# Patient Record
Sex: Male | Born: 1945 | Race: White | Hispanic: No | Smoking: Former smoker
Health system: Southern US, Community
[De-identification: ages and names within clinical notes are randomized; demographics above are authoritative.]

## PROBLEM LIST (undated history)

## (undated) DIAGNOSIS — C61 Malignant neoplasm of prostate: Secondary | ICD-10-CM

## (undated) DIAGNOSIS — S064X9A Epidural hemorrhage with loss of consciousness of unspecified duration, initial encounter: Secondary | ICD-10-CM

## (undated) DIAGNOSIS — R918 Other nonspecific abnormal finding of lung field: Secondary | ICD-10-CM

## (undated) DIAGNOSIS — D509 Iron deficiency anemia, unspecified: Secondary | ICD-10-CM

## (undated) DIAGNOSIS — M159 Polyosteoarthritis, unspecified: Secondary | ICD-10-CM

## (undated) DIAGNOSIS — Z8719 Personal history of other diseases of the digestive system: Secondary | ICD-10-CM

## (undated) DIAGNOSIS — G4700 Insomnia, unspecified: Secondary | ICD-10-CM

## (undated) DIAGNOSIS — I679 Cerebrovascular disease, unspecified: Secondary | ICD-10-CM

## (undated) DIAGNOSIS — R2681 Unsteadiness on feet: Secondary | ICD-10-CM

## (undated) DIAGNOSIS — J449 Chronic obstructive pulmonary disease, unspecified: Secondary | ICD-10-CM

## (undated) DIAGNOSIS — R569 Unspecified convulsions: Secondary | ICD-10-CM

## (undated) DIAGNOSIS — Z953 Presence of xenogenic heart valve: Secondary | ICD-10-CM

## (undated) DIAGNOSIS — I330 Acute and subacute infective endocarditis: Secondary | ICD-10-CM

## (undated) DIAGNOSIS — K219 Gastro-esophageal reflux disease without esophagitis: Secondary | ICD-10-CM

## (undated) DIAGNOSIS — Z952 Presence of prosthetic heart valve: Secondary | ICD-10-CM

## (undated) DIAGNOSIS — S32401A Unspecified fracture of right acetabulum, initial encounter for closed fracture: Secondary | ICD-10-CM

## (undated) DIAGNOSIS — I10 Essential (primary) hypertension: Secondary | ICD-10-CM

## (undated) DIAGNOSIS — Z87891 Personal history of nicotine dependence: Secondary | ICD-10-CM

## (undated) DIAGNOSIS — I428 Other cardiomyopathies: Secondary | ICD-10-CM

## (undated) DIAGNOSIS — E871 Hypo-osmolality and hyponatremia: Secondary | ICD-10-CM

## (undated) DIAGNOSIS — I4891 Unspecified atrial fibrillation: Secondary | ICD-10-CM

## (undated) DIAGNOSIS — T8189XA Other complications of procedures, not elsewhere classified, initial encounter: Secondary | ICD-10-CM

## (undated) DIAGNOSIS — I340 Nonrheumatic mitral (valve) insufficiency: Principal | ICD-10-CM

## (undated) HISTORY — DX: Other cardiomyopathies: I42.8

## (undated) HISTORY — PX: PROSTATE BIOPSY: SHX241

## (undated) HISTORY — DX: Malignant neoplasm of prostate: C61

## (undated) HISTORY — DX: Essential (primary) hypertension: I10

## (undated) HISTORY — DX: Insomnia, unspecified: G47.00

## (undated) HISTORY — DX: Iron deficiency anemia, unspecified: D50.9

## (undated) HISTORY — DX: Acute and subacute infective endocarditis: I33.0

## (undated) HISTORY — DX: Personal history of other diseases of the digestive system: Z87.19

## (undated) HISTORY — DX: Other nonspecific abnormal finding of lung field: R91.8

## (undated) HISTORY — DX: Chronic obstructive pulmonary disease, unspecified: J44.9

## (undated) HISTORY — DX: Unspecified atrial fibrillation: I48.91

## (undated) HISTORY — DX: Epidural hemorrhage with loss of consciousness of unspecified duration, initial encounter: S06.4X9A

## (undated) HISTORY — DX: Nonrheumatic mitral (valve) insufficiency: I34.0

## (undated) HISTORY — DX: Cerebrovascular disease, unspecified: I67.9

## (undated) HISTORY — DX: Gastro-esophageal reflux disease without esophagitis: K21.9

## (undated) HISTORY — DX: Polyosteoarthritis, unspecified: M15.9

## (undated) HISTORY — DX: Presence of prosthetic heart valve: Z95.2

## (undated) HISTORY — DX: Personal history of nicotine dependence: Z87.891

## (undated) HISTORY — DX: Unspecified fracture of right acetabulum, initial encounter for closed fracture: S32.401A

## (undated) HISTORY — DX: Hypo-osmolality and hyponatremia: E87.1

## (undated) HISTORY — DX: Unsteadiness on feet: R26.81

## (undated) HISTORY — PX: CARDIAC VALVE REPLACEMENT: SHX585

---

## 1997-07-30 DIAGNOSIS — J449 Chronic obstructive pulmonary disease, unspecified: Secondary | ICD-10-CM

## 1997-07-30 DIAGNOSIS — I330 Acute and subacute infective endocarditis: Secondary | ICD-10-CM

## 1997-07-30 HISTORY — DX: Chronic obstructive pulmonary disease, unspecified: J44.9

## 1997-07-30 HISTORY — DX: Acute and subacute infective endocarditis: I33.0

## 1998-01-03 ENCOUNTER — Inpatient Hospital Stay (HOSPITAL_COMMUNITY): Admission: EM | Admit: 1998-01-03 | Discharge: 1998-02-09 | Payer: Self-pay

## 1998-02-01 DIAGNOSIS — Z952 Presence of prosthetic heart valve: Secondary | ICD-10-CM | POA: Insufficient documentation

## 1998-02-01 HISTORY — PX: MITRAL VALVE REPLACEMENT: SHX147

## 1998-02-01 HISTORY — DX: Presence of prosthetic heart valve: Z95.2

## 1998-03-21 ENCOUNTER — Encounter: Admission: RE | Admit: 1998-03-21 | Discharge: 1998-06-19 | Payer: Self-pay | Admitting: Specialist

## 1999-01-16 ENCOUNTER — Encounter: Payer: Self-pay | Admitting: Emergency Medicine

## 1999-01-17 ENCOUNTER — Encounter: Payer: Self-pay | Admitting: Internal Medicine

## 1999-01-17 ENCOUNTER — Inpatient Hospital Stay (HOSPITAL_COMMUNITY): Admission: EM | Admit: 1999-01-17 | Discharge: 1999-01-19 | Payer: Self-pay | Admitting: Emergency Medicine

## 1999-01-17 ENCOUNTER — Encounter (HOSPITAL_BASED_OUTPATIENT_CLINIC_OR_DEPARTMENT_OTHER): Payer: Self-pay | Admitting: Internal Medicine

## 2003-09-22 ENCOUNTER — Ambulatory Visit (HOSPITAL_COMMUNITY): Admission: RE | Admit: 2003-09-22 | Discharge: 2003-09-22 | Payer: Self-pay | Admitting: Gastroenterology

## 2004-09-12 ENCOUNTER — Inpatient Hospital Stay (HOSPITAL_COMMUNITY): Admission: EM | Admit: 2004-09-12 | Discharge: 2004-09-23 | Payer: Self-pay | Admitting: Emergency Medicine

## 2004-09-12 ENCOUNTER — Ambulatory Visit: Payer: Self-pay | Admitting: Cardiology

## 2004-09-13 ENCOUNTER — Encounter: Payer: Self-pay | Admitting: Cardiology

## 2004-09-27 ENCOUNTER — Ambulatory Visit: Payer: Self-pay | Admitting: Cardiology

## 2004-10-05 ENCOUNTER — Ambulatory Visit: Payer: Self-pay | Admitting: Internal Medicine

## 2004-10-11 ENCOUNTER — Ambulatory Visit: Payer: Self-pay | Admitting: *Deleted

## 2004-10-18 ENCOUNTER — Ambulatory Visit: Payer: Self-pay | Admitting: Cardiovascular Disease

## 2004-10-26 ENCOUNTER — Ambulatory Visit: Payer: Self-pay | Admitting: Internal Medicine

## 2004-11-09 ENCOUNTER — Ambulatory Visit: Payer: Self-pay | Admitting: Cardiology

## 2004-12-07 ENCOUNTER — Ambulatory Visit: Payer: Self-pay | Admitting: Cardiology

## 2004-12-18 ENCOUNTER — Ambulatory Visit: Payer: Self-pay | Admitting: Cardiology

## 2004-12-18 ENCOUNTER — Ambulatory Visit: Payer: Self-pay

## 2005-01-04 ENCOUNTER — Ambulatory Visit: Payer: Self-pay | Admitting: Cardiology

## 2005-01-18 ENCOUNTER — Ambulatory Visit: Payer: Self-pay | Admitting: Cardiology

## 2005-02-08 ENCOUNTER — Ambulatory Visit: Payer: Self-pay | Admitting: Internal Medicine

## 2005-02-14 ENCOUNTER — Ambulatory Visit: Payer: Self-pay | Admitting: Cardiology

## 2005-02-16 ENCOUNTER — Ambulatory Visit: Admission: RE | Admit: 2005-02-16 | Discharge: 2005-02-16 | Payer: Self-pay | Admitting: Cardiology

## 2005-03-01 ENCOUNTER — Ambulatory Visit: Payer: Self-pay | Admitting: Internal Medicine

## 2005-03-01 ENCOUNTER — Ambulatory Visit: Payer: Self-pay | Admitting: Cardiology

## 2005-03-08 ENCOUNTER — Ambulatory Visit: Payer: Self-pay | Admitting: Cardiology

## 2005-03-09 ENCOUNTER — Ambulatory Visit: Payer: Self-pay

## 2005-03-29 ENCOUNTER — Ambulatory Visit: Payer: Self-pay | Admitting: Cardiology

## 2005-04-19 ENCOUNTER — Ambulatory Visit: Payer: Self-pay | Admitting: Cardiology

## 2005-05-02 ENCOUNTER — Ambulatory Visit: Payer: Self-pay | Admitting: Cardiology

## 2005-05-30 ENCOUNTER — Ambulatory Visit: Payer: Self-pay | Admitting: Cardiology

## 2005-06-11 ENCOUNTER — Ambulatory Visit: Payer: Self-pay | Admitting: Cardiology

## 2005-06-26 ENCOUNTER — Ambulatory Visit: Payer: Self-pay | Admitting: Cardiology

## 2005-07-17 ENCOUNTER — Ambulatory Visit: Payer: Self-pay | Admitting: Cardiology

## 2005-08-01 ENCOUNTER — Ambulatory Visit: Payer: Self-pay | Admitting: Cardiovascular Disease

## 2005-08-09 ENCOUNTER — Ambulatory Visit: Payer: Self-pay | Admitting: Cardiology

## 2005-08-15 ENCOUNTER — Ambulatory Visit: Payer: Self-pay | Admitting: Cardiovascular Disease

## 2005-08-24 ENCOUNTER — Ambulatory Visit: Payer: Self-pay | Admitting: Cardiology

## 2005-09-07 ENCOUNTER — Ambulatory Visit: Payer: Self-pay | Admitting: Cardiovascular Disease

## 2005-09-14 ENCOUNTER — Ambulatory Visit: Payer: Self-pay | Admitting: Cardiology

## 2005-09-28 ENCOUNTER — Ambulatory Visit: Payer: Self-pay | Admitting: Cardiology

## 2005-10-12 ENCOUNTER — Ambulatory Visit: Payer: Self-pay | Admitting: Internal Medicine

## 2005-11-01 ENCOUNTER — Ambulatory Visit: Payer: Self-pay | Admitting: Cardiology

## 2005-11-07 ENCOUNTER — Ambulatory Visit: Payer: Self-pay | Admitting: Cardiology

## 2005-11-14 ENCOUNTER — Ambulatory Visit: Payer: Self-pay | Admitting: Cardiology

## 2005-11-22 ENCOUNTER — Ambulatory Visit: Payer: Self-pay | Admitting: *Deleted

## 2005-12-06 ENCOUNTER — Ambulatory Visit: Payer: Self-pay | Admitting: Cardiology

## 2005-12-20 ENCOUNTER — Ambulatory Visit: Payer: Self-pay | Admitting: Internal Medicine

## 2006-01-10 ENCOUNTER — Ambulatory Visit: Payer: Self-pay | Admitting: Cardiology

## 2006-01-29 ENCOUNTER — Ambulatory Visit: Payer: Self-pay | Admitting: Pulmonary Disease

## 2006-02-06 ENCOUNTER — Ambulatory Visit: Payer: Self-pay | Admitting: Cardiology

## 2006-02-06 ENCOUNTER — Ambulatory Visit: Payer: Self-pay | Admitting: Cardiovascular Disease

## 2006-02-08 ENCOUNTER — Encounter: Payer: Self-pay | Admitting: Internal Medicine

## 2006-02-08 ENCOUNTER — Emergency Department (HOSPITAL_COMMUNITY): Admission: EM | Admit: 2006-02-08 | Discharge: 2006-02-08 | Payer: Self-pay | Admitting: Family Medicine

## 2006-02-08 ENCOUNTER — Ambulatory Visit: Payer: Self-pay

## 2006-02-12 ENCOUNTER — Ambulatory Visit: Payer: Self-pay | Admitting: Cardiology

## 2006-03-04 ENCOUNTER — Ambulatory Visit: Payer: Self-pay

## 2006-03-06 ENCOUNTER — Ambulatory Visit: Payer: Self-pay | Admitting: Cardiology

## 2006-03-20 ENCOUNTER — Ambulatory Visit: Payer: Self-pay | Admitting: Cardiology

## 2006-03-28 ENCOUNTER — Ambulatory Visit: Payer: Self-pay | Admitting: Cardiology

## 2006-04-11 ENCOUNTER — Ambulatory Visit: Payer: Self-pay | Admitting: Cardiology

## 2006-04-25 ENCOUNTER — Ambulatory Visit: Payer: Self-pay | Admitting: Cardiology

## 2006-05-23 ENCOUNTER — Ambulatory Visit: Payer: Self-pay | Admitting: Cardiology

## 2006-05-31 ENCOUNTER — Ambulatory Visit: Payer: Self-pay | Admitting: Cardiology

## 2006-06-14 ENCOUNTER — Ambulatory Visit: Payer: Self-pay | Admitting: Internal Medicine

## 2006-07-05 ENCOUNTER — Ambulatory Visit: Payer: Self-pay | Admitting: Cardiology

## 2006-07-15 ENCOUNTER — Ambulatory Visit: Payer: Self-pay | Admitting: Cardiology

## 2006-07-29 ENCOUNTER — Ambulatory Visit: Payer: Self-pay | Admitting: Cardiovascular Disease

## 2006-08-12 ENCOUNTER — Ambulatory Visit: Payer: Self-pay | Admitting: Cardiology

## 2006-08-22 ENCOUNTER — Ambulatory Visit: Payer: Self-pay | Admitting: Internal Medicine

## 2006-09-05 ENCOUNTER — Ambulatory Visit: Payer: Self-pay | Admitting: Cardiology

## 2006-09-23 ENCOUNTER — Ambulatory Visit: Payer: Self-pay | Admitting: Cardiology

## 2006-10-02 ENCOUNTER — Ambulatory Visit: Payer: Self-pay | Admitting: *Deleted

## 2006-10-11 ENCOUNTER — Ambulatory Visit: Payer: Self-pay | Admitting: Cardiovascular Disease

## 2006-11-01 ENCOUNTER — Ambulatory Visit: Payer: Self-pay | Admitting: Cardiology

## 2006-11-29 ENCOUNTER — Ambulatory Visit: Payer: Self-pay | Admitting: Cardiology

## 2006-12-09 ENCOUNTER — Ambulatory Visit: Payer: Self-pay | Admitting: Cardiology

## 2006-12-19 ENCOUNTER — Ambulatory Visit: Payer: Self-pay | Admitting: Internal Medicine

## 2007-01-06 ENCOUNTER — Ambulatory Visit: Payer: Self-pay | Admitting: Cardiovascular Disease

## 2007-02-03 ENCOUNTER — Ambulatory Visit: Payer: Self-pay | Admitting: Cardiology

## 2007-02-14 ENCOUNTER — Ambulatory Visit: Payer: Self-pay | Admitting: Cardiovascular Disease

## 2007-02-19 ENCOUNTER — Ambulatory Visit: Payer: Self-pay

## 2007-03-02 ENCOUNTER — Ambulatory Visit: Payer: Self-pay | Admitting: Pulmonary Disease

## 2007-03-02 ENCOUNTER — Inpatient Hospital Stay (HOSPITAL_COMMUNITY): Admission: EM | Admit: 2007-03-02 | Discharge: 2007-03-06 | Payer: Self-pay | Admitting: Emergency Medicine

## 2007-03-02 ENCOUNTER — Ambulatory Visit: Payer: Self-pay | Admitting: Cardiology

## 2007-03-06 ENCOUNTER — Encounter: Payer: Self-pay | Admitting: Pulmonary Disease

## 2007-03-07 ENCOUNTER — Ambulatory Visit: Payer: Self-pay | Admitting: Cardiology

## 2007-03-11 ENCOUNTER — Ambulatory Visit: Payer: Self-pay | Admitting: Cardiology

## 2007-03-19 ENCOUNTER — Ambulatory Visit: Payer: Self-pay | Admitting: Cardiology

## 2007-03-28 ENCOUNTER — Ambulatory Visit: Payer: Self-pay | Admitting: Pulmonary Disease

## 2007-04-10 ENCOUNTER — Ambulatory Visit: Payer: Self-pay | Admitting: Thoracic Surgery

## 2007-04-14 ENCOUNTER — Ambulatory Visit (HOSPITAL_COMMUNITY): Admission: RE | Admit: 2007-04-14 | Discharge: 2007-04-14 | Payer: Self-pay | Admitting: Gastroenterology

## 2007-04-14 ENCOUNTER — Encounter (INDEPENDENT_AMBULATORY_CARE_PROVIDER_SITE_OTHER): Payer: Self-pay | Admitting: Gastroenterology

## 2007-04-18 ENCOUNTER — Ambulatory Visit: Payer: Self-pay | Admitting: Cardiovascular Disease

## 2007-04-23 ENCOUNTER — Ambulatory Visit: Payer: Self-pay | Admitting: Thoracic Surgery (Cardiothoracic Vascular Surgery)

## 2007-04-24 ENCOUNTER — Ambulatory Visit: Payer: Self-pay | Admitting: Cardiology

## 2007-04-24 LAB — CONVERTED CEMR LAB
CO2: 29 meq/L (ref 19–32)
Creatinine, Ser: 1.2 mg/dL (ref 0.4–1.5)
Glucose, Bld: 93 mg/dL (ref 70–99)
Potassium: 4.9 meq/L (ref 3.5–5.1)
Sodium: 142 meq/L (ref 135–145)

## 2007-04-29 ENCOUNTER — Ambulatory Visit: Payer: Self-pay | Admitting: Cardiology

## 2007-04-29 ENCOUNTER — Inpatient Hospital Stay (HOSPITAL_COMMUNITY)
Admission: RE | Admit: 2007-04-29 | Discharge: 2007-05-03 | Payer: Self-pay | Admitting: Thoracic Surgery (Cardiothoracic Vascular Surgery)

## 2007-04-29 ENCOUNTER — Encounter: Payer: Self-pay | Admitting: Thoracic Surgery (Cardiothoracic Vascular Surgery)

## 2007-04-29 ENCOUNTER — Ambulatory Visit: Payer: Self-pay | Admitting: Thoracic Surgery (Cardiothoracic Vascular Surgery)

## 2007-04-29 HISTORY — PX: VIDEO ASSISTED THORACOSCOPY: SHX5073

## 2007-05-08 ENCOUNTER — Ambulatory Visit: Payer: Self-pay | Admitting: Cardiothoracic Surgery

## 2007-05-08 ENCOUNTER — Ambulatory Visit: Payer: Self-pay | Admitting: Cardiology

## 2007-05-16 ENCOUNTER — Ambulatory Visit: Payer: Self-pay | Admitting: Cardiothoracic Surgery

## 2007-05-16 ENCOUNTER — Encounter
Admission: RE | Admit: 2007-05-16 | Discharge: 2007-05-16 | Payer: Self-pay | Admitting: Thoracic Surgery (Cardiothoracic Vascular Surgery)

## 2007-05-16 ENCOUNTER — Ambulatory Visit: Payer: Self-pay | Admitting: Cardiology

## 2007-05-30 ENCOUNTER — Ambulatory Visit: Payer: Self-pay | Admitting: Cardiology

## 2007-05-30 LAB — CONVERTED CEMR LAB
Basophils Absolute: 0 10*3/uL (ref 0.0–0.1)
CO2: 29 meq/L (ref 19–32)
Calcium: 9 mg/dL (ref 8.4–10.5)
Creatinine, Ser: 1.2 mg/dL (ref 0.4–1.5)
Eosinophils Absolute: 0.4 10*3/uL (ref 0.0–0.6)
GFR calc Af Amer: 79 mL/min
Glucose, Bld: 105 mg/dL — ABNORMAL HIGH (ref 70–99)
HCT: 26.3 % — ABNORMAL LOW (ref 39.0–52.0)
MCHC: 33.2 g/dL (ref 30.0–36.0)
MCV: 80.3 fL (ref 78.0–100.0)
Monocytes Relative: 11.8 % — ABNORMAL HIGH (ref 3.0–11.0)
Neutrophils Relative %: 72.9 % (ref 43.0–77.0)
Platelets: 319 10*3/uL (ref 150–400)
Potassium: 5 meq/L (ref 3.5–5.1)
RBC: 3.28 M/uL — ABNORMAL LOW (ref 4.22–5.81)

## 2007-06-02 ENCOUNTER — Encounter
Admission: RE | Admit: 2007-06-02 | Discharge: 2007-06-02 | Payer: Self-pay | Admitting: Thoracic Surgery (Cardiothoracic Vascular Surgery)

## 2007-06-02 ENCOUNTER — Ambulatory Visit: Payer: Self-pay | Admitting: Thoracic Surgery (Cardiothoracic Vascular Surgery)

## 2007-06-05 ENCOUNTER — Ambulatory Visit: Payer: Self-pay | Admitting: Cardiology

## 2007-06-05 LAB — CONVERTED CEMR LAB
BUN: 23 mg/dL (ref 6–23)
Basophils Absolute: 0 10*3/uL (ref 0.0–0.1)
Creatinine, Ser: 1.3 mg/dL (ref 0.4–1.5)
Eosinophils Absolute: 0.4 10*3/uL (ref 0.0–0.6)
GFR calc Af Amer: 72 mL/min
GFR calc non Af Amer: 60 mL/min
HCT: 26 % — ABNORMAL LOW (ref 39.0–52.0)
Hemoglobin: 8.8 g/dL — ABNORMAL LOW (ref 13.0–17.0)
MCHC: 33.9 g/dL (ref 30.0–36.0)
MCV: 80.7 fL (ref 78.0–100.0)
Monocytes Absolute: 1.2 10*3/uL — ABNORMAL HIGH (ref 0.2–0.7)
Monocytes Relative: 12.2 % — ABNORMAL HIGH (ref 3.0–11.0)
Neutrophils Relative %: 71.2 % (ref 43.0–77.0)
Potassium: 4.6 meq/L (ref 3.5–5.1)
RDW: 16.2 % — ABNORMAL HIGH (ref 11.5–14.6)
Sodium: 139 meq/L (ref 135–145)

## 2007-06-10 ENCOUNTER — Ambulatory Visit: Payer: Self-pay | Admitting: Cardiology

## 2007-06-16 ENCOUNTER — Ambulatory Visit: Payer: Self-pay | Admitting: Pulmonary Disease

## 2007-06-20 ENCOUNTER — Ambulatory Visit: Payer: Self-pay | Admitting: Internal Medicine

## 2007-07-08 ENCOUNTER — Ambulatory Visit: Payer: Self-pay | Admitting: Cardiology

## 2007-07-08 LAB — CONVERTED CEMR LAB
Basophils Relative: 0 % (ref 0.0–1.0)
Eosinophils Absolute: 0.2 10*3/uL (ref 0.0–0.6)
Eosinophils Relative: 2.2 % (ref 0.0–5.0)
HCT: 31.1 % — ABNORMAL LOW (ref 39.0–52.0)
Neutrophils Relative %: 71.8 % (ref 43.0–77.0)
Platelets: 316 10*3/uL (ref 150–400)
RBC: 3.66 M/uL — ABNORMAL LOW (ref 4.22–5.81)
RDW: 17.1 % — ABNORMAL HIGH (ref 11.5–14.6)
WBC: 9.4 10*3/uL (ref 4.5–10.5)

## 2007-07-29 ENCOUNTER — Ambulatory Visit: Payer: Self-pay | Admitting: Cardiology

## 2007-07-29 LAB — CONVERTED CEMR LAB
Basophils Absolute: 0 10*3/uL (ref 0.0–0.1)
HCT: 33.6 % — ABNORMAL LOW (ref 39.0–52.0)
Hemoglobin: 10.9 g/dL — ABNORMAL LOW (ref 13.0–17.0)
Lymphocytes Relative: 12.3 % (ref 12.0–46.0)
MCHC: 32.3 g/dL (ref 30.0–36.0)
MCV: 85.6 fL (ref 78.0–100.0)
Monocytes Absolute: 1.2 10*3/uL — ABNORMAL HIGH (ref 0.2–0.7)
Monocytes Relative: 13.2 % — ABNORMAL HIGH (ref 3.0–11.0)
Neutro Abs: 6.6 10*3/uL (ref 1.4–7.7)
Neutrophils Relative %: 72.1 % (ref 43.0–77.0)
RDW: 16.1 % — ABNORMAL HIGH (ref 11.5–14.6)

## 2007-08-05 ENCOUNTER — Ambulatory Visit: Payer: Self-pay | Admitting: Cardiology

## 2007-08-29 ENCOUNTER — Ambulatory Visit: Payer: Self-pay | Admitting: Cardiology

## 2007-08-29 ENCOUNTER — Ambulatory Visit: Payer: Self-pay

## 2007-08-29 LAB — CONVERTED CEMR LAB
Basophils Relative: 0.3 % (ref 0.0–1.0)
Eosinophils Absolute: 0.2 10*3/uL (ref 0.0–0.6)
Lymphocytes Relative: 16 % (ref 12.0–46.0)
MCV: 83.9 fL (ref 78.0–100.0)
Monocytes Relative: 13.9 % — ABNORMAL HIGH (ref 3.0–11.0)
Neutro Abs: 6.1 10*3/uL (ref 1.4–7.7)
Platelets: 287 10*3/uL (ref 150–400)
RBC: 3.86 M/uL — ABNORMAL LOW (ref 4.22–5.81)

## 2007-09-10 ENCOUNTER — Ambulatory Visit: Payer: Self-pay | Admitting: Cardiology

## 2007-09-10 ENCOUNTER — Ambulatory Visit (HOSPITAL_COMMUNITY): Admission: RE | Admit: 2007-09-10 | Discharge: 2007-09-10 | Payer: Self-pay | Admitting: Cardiology

## 2007-09-10 LAB — CONVERTED CEMR LAB
BUN: 10 mg/dL (ref 6–23)
Calcium: 8.9 mg/dL (ref 8.4–10.5)
Chloride: 102 meq/L (ref 96–112)
GFR calc Af Amer: 79 mL/min
GFR calc non Af Amer: 65 mL/min
Saturation Ratios: 6.5 % — ABNORMAL LOW (ref 20.0–50.0)
Transferrin: 210.1 mg/dL — ABNORMAL LOW (ref >=212.0)

## 2007-09-17 ENCOUNTER — Ambulatory Visit (HOSPITAL_COMMUNITY): Admission: RE | Admit: 2007-09-17 | Discharge: 2007-09-17 | Payer: Self-pay | Admitting: Gastroenterology

## 2007-09-26 ENCOUNTER — Ambulatory Visit: Payer: Self-pay | Admitting: Internal Medicine

## 2007-10-10 ENCOUNTER — Ambulatory Visit: Payer: Self-pay | Admitting: Internal Medicine

## 2007-10-17 ENCOUNTER — Ambulatory Visit: Payer: Self-pay | Admitting: Cardiovascular Disease

## 2007-10-31 ENCOUNTER — Ambulatory Visit: Payer: Self-pay | Admitting: Cardiology

## 2007-11-14 ENCOUNTER — Ambulatory Visit: Payer: Self-pay | Admitting: Cardiology

## 2007-11-28 ENCOUNTER — Ambulatory Visit: Payer: Self-pay | Admitting: Cardiology

## 2007-12-19 ENCOUNTER — Ambulatory Visit: Payer: Self-pay | Admitting: Internal Medicine

## 2008-01-16 ENCOUNTER — Ambulatory Visit: Payer: Self-pay | Admitting: Cardiology

## 2008-01-28 ENCOUNTER — Ambulatory Visit: Payer: Self-pay | Admitting: Internal Medicine

## 2008-02-24 ENCOUNTER — Ambulatory Visit: Payer: Self-pay | Admitting: Cardiology

## 2008-02-24 ENCOUNTER — Ambulatory Visit: Payer: Self-pay | Admitting: Internal Medicine

## 2008-02-24 ENCOUNTER — Ambulatory Visit: Payer: Self-pay

## 2008-02-24 LAB — CONVERTED CEMR LAB
Basophils Absolute: 0 10*3/uL (ref 0.0–0.1)
Basophils Relative: 0.4 % (ref 0.0–3.0)
CO2: 28 meq/L (ref 19–32)
Calcium: 8.9 mg/dL (ref 8.4–10.5)
Creatinine, Ser: 1.3 mg/dL (ref 0.4–1.5)
Eosinophils Absolute: 0.1 10*3/uL (ref 0.0–0.7)
GFR calc non Af Amer: 59 mL/min
HCT: 37.3 % — ABNORMAL LOW (ref 39.0–52.0)
Hemoglobin: 12.9 g/dL — ABNORMAL LOW (ref 13.0–17.0)
MCHC: 34.6 g/dL (ref 30.0–36.0)
MCV: 91.3 fL (ref 78.0–100.0)
Monocytes Absolute: 1.1 10*3/uL — ABNORMAL HIGH (ref 0.1–1.0)
Neutro Abs: 5.7 10*3/uL (ref 1.4–7.7)
RBC: 4.08 M/uL — ABNORMAL LOW (ref 4.22–5.81)

## 2008-03-09 ENCOUNTER — Ambulatory Visit: Payer: Self-pay | Admitting: Internal Medicine

## 2008-03-16 ENCOUNTER — Ambulatory Visit: Payer: Self-pay | Admitting: Cardiology

## 2008-03-26 ENCOUNTER — Ambulatory Visit: Payer: Self-pay | Admitting: Cardiology

## 2008-04-07 ENCOUNTER — Ambulatory Visit: Payer: Self-pay | Admitting: Internal Medicine

## 2008-04-28 ENCOUNTER — Ambulatory Visit: Payer: Self-pay | Admitting: Cardiovascular Disease

## 2008-05-12 ENCOUNTER — Ambulatory Visit: Payer: Self-pay | Admitting: Internal Medicine

## 2008-06-02 ENCOUNTER — Ambulatory Visit: Payer: Self-pay | Admitting: Cardiology

## 2008-06-13 ENCOUNTER — Emergency Department (HOSPITAL_COMMUNITY): Admission: EM | Admit: 2008-06-13 | Discharge: 2008-06-13 | Payer: Self-pay | Admitting: Family Medicine

## 2008-06-16 ENCOUNTER — Ambulatory Visit: Payer: Self-pay | Admitting: Cardiology

## 2008-07-12 ENCOUNTER — Ambulatory Visit: Payer: Self-pay | Admitting: Cardiology

## 2008-07-14 ENCOUNTER — Ambulatory Visit: Payer: Self-pay | Admitting: Cardiovascular Disease

## 2008-08-11 ENCOUNTER — Ambulatory Visit: Payer: Self-pay | Admitting: Cardiology

## 2008-09-08 ENCOUNTER — Ambulatory Visit: Payer: Self-pay | Admitting: Cardiology

## 2008-09-29 ENCOUNTER — Ambulatory Visit: Payer: Self-pay | Admitting: Internal Medicine

## 2008-10-13 ENCOUNTER — Ambulatory Visit: Payer: Self-pay | Admitting: Cardiology

## 2008-10-20 ENCOUNTER — Ambulatory Visit: Payer: Self-pay | Admitting: Cardiology

## 2008-10-20 ENCOUNTER — Encounter: Payer: Self-pay | Admitting: Cardiology

## 2008-10-20 ENCOUNTER — Ambulatory Visit: Payer: Self-pay

## 2008-10-20 DIAGNOSIS — J439 Emphysema, unspecified: Secondary | ICD-10-CM | POA: Insufficient documentation

## 2008-10-20 DIAGNOSIS — I330 Acute and subacute infective endocarditis: Secondary | ICD-10-CM | POA: Insufficient documentation

## 2008-10-20 DIAGNOSIS — Z8719 Personal history of other diseases of the digestive system: Secondary | ICD-10-CM

## 2008-10-20 DIAGNOSIS — M159 Polyosteoarthritis, unspecified: Secondary | ICD-10-CM | POA: Insufficient documentation

## 2008-10-20 DIAGNOSIS — Z87891 Personal history of nicotine dependence: Secondary | ICD-10-CM | POA: Insufficient documentation

## 2008-10-20 DIAGNOSIS — I10 Essential (primary) hypertension: Secondary | ICD-10-CM

## 2008-10-20 DIAGNOSIS — K219 Gastro-esophageal reflux disease without esophagitis: Secondary | ICD-10-CM | POA: Insufficient documentation

## 2008-10-20 DIAGNOSIS — D509 Iron deficiency anemia, unspecified: Secondary | ICD-10-CM | POA: Insufficient documentation

## 2008-10-20 DIAGNOSIS — I679 Cerebrovascular disease, unspecified: Secondary | ICD-10-CM | POA: Insufficient documentation

## 2008-10-20 DIAGNOSIS — I428 Other cardiomyopathies: Secondary | ICD-10-CM

## 2008-10-20 DIAGNOSIS — J984 Other disorders of lung: Secondary | ICD-10-CM | POA: Insufficient documentation

## 2008-10-20 DIAGNOSIS — Z9889 Other specified postprocedural states: Secondary | ICD-10-CM | POA: Insufficient documentation

## 2008-10-20 HISTORY — DX: Other cardiomyopathies: I42.8

## 2008-10-20 HISTORY — DX: Essential (primary) hypertension: I10

## 2008-10-20 HISTORY — DX: Personal history of other diseases of the digestive system: Z87.19

## 2008-10-27 ENCOUNTER — Ambulatory Visit: Payer: Self-pay | Admitting: Cardiology

## 2008-11-03 ENCOUNTER — Encounter: Admission: RE | Admit: 2008-11-03 | Discharge: 2008-11-03 | Payer: Self-pay | Admitting: Internal Medicine

## 2008-11-04 ENCOUNTER — Ambulatory Visit: Payer: Self-pay | Admitting: Internal Medicine

## 2008-11-04 LAB — CONVERTED CEMR LAB: Prothrombin Time: 51.7 s — ABNORMAL HIGH (ref 10.9–13.3)

## 2008-11-09 ENCOUNTER — Ambulatory Visit (HOSPITAL_COMMUNITY): Admission: RE | Admit: 2008-11-09 | Discharge: 2008-11-09 | Payer: Self-pay | Admitting: Internal Medicine

## 2008-11-11 ENCOUNTER — Ambulatory Visit: Payer: Self-pay | Admitting: Internal Medicine

## 2008-11-11 ENCOUNTER — Encounter: Payer: Self-pay | Admitting: Cardiology

## 2008-11-11 ENCOUNTER — Ambulatory Visit: Payer: Self-pay | Admitting: Cardiology

## 2008-11-11 DIAGNOSIS — I4891 Unspecified atrial fibrillation: Secondary | ICD-10-CM | POA: Insufficient documentation

## 2008-11-11 DIAGNOSIS — I050 Rheumatic mitral stenosis: Secondary | ICD-10-CM | POA: Insufficient documentation

## 2008-11-11 DIAGNOSIS — I4892 Unspecified atrial flutter: Secondary | ICD-10-CM | POA: Insufficient documentation

## 2008-11-11 HISTORY — DX: Unspecified atrial fibrillation: I48.91

## 2008-11-18 ENCOUNTER — Ambulatory Visit: Payer: Self-pay | Admitting: Internal Medicine

## 2008-11-29 ENCOUNTER — Ambulatory Visit: Payer: Self-pay | Admitting: Cardiology

## 2008-11-29 ENCOUNTER — Ambulatory Visit: Payer: Self-pay | Admitting: Internal Medicine

## 2008-12-06 ENCOUNTER — Ambulatory Visit: Payer: Self-pay | Admitting: Cardiovascular Disease

## 2008-12-13 ENCOUNTER — Ambulatory Visit: Payer: Self-pay | Admitting: Internal Medicine

## 2008-12-20 ENCOUNTER — Ambulatory Visit: Payer: Self-pay | Admitting: Cardiology

## 2008-12-28 ENCOUNTER — Ambulatory Visit: Payer: Self-pay | Admitting: Cardiology

## 2008-12-28 ENCOUNTER — Encounter: Payer: Self-pay | Admitting: *Deleted

## 2008-12-28 ENCOUNTER — Ambulatory Visit: Payer: Self-pay | Admitting: Cardiovascular Disease

## 2008-12-28 LAB — CONVERTED CEMR LAB: Protime: 23.4

## 2009-01-04 ENCOUNTER — Ambulatory Visit: Payer: Self-pay | Admitting: Cardiology

## 2009-01-04 LAB — CONVERTED CEMR LAB: POC INR: 2

## 2009-01-05 LAB — CONVERTED CEMR LAB
Basophils Relative: 0.1 % (ref 0.0–3.0)
CO2: 31 meq/L (ref 19–32)
Calcium: 8.9 mg/dL (ref 8.4–10.5)
Hemoglobin: 13.9 g/dL (ref 13.0–17.0)
Lymphocytes Relative: 17.1 % (ref 12.0–46.0)
MCHC: 35.1 g/dL (ref 30.0–36.0)
Monocytes Relative: 15 % — ABNORMAL HIGH (ref 3.0–12.0)
Neutro Abs: 4.2 10*3/uL (ref 1.4–7.7)
RBC: 4.24 M/uL (ref 4.22–5.81)
Sodium: 141 meq/L (ref 135–145)

## 2009-01-11 ENCOUNTER — Ambulatory Visit: Payer: Self-pay | Admitting: Internal Medicine

## 2009-01-11 LAB — CONVERTED CEMR LAB
POC INR: 2.6
Protime: 19.5

## 2009-01-17 ENCOUNTER — Ambulatory Visit: Payer: Self-pay | Admitting: Cardiology

## 2009-01-17 ENCOUNTER — Encounter (INDEPENDENT_AMBULATORY_CARE_PROVIDER_SITE_OTHER): Payer: Self-pay

## 2009-01-17 ENCOUNTER — Ambulatory Visit: Payer: Self-pay | Admitting: Internal Medicine

## 2009-01-17 ENCOUNTER — Encounter (INDEPENDENT_AMBULATORY_CARE_PROVIDER_SITE_OTHER): Payer: Self-pay | Admitting: Pharmacist

## 2009-01-17 LAB — CONVERTED CEMR LAB
POC INR: 2.7
Protime: 19.9

## 2009-01-18 ENCOUNTER — Ambulatory Visit (HOSPITAL_COMMUNITY): Admission: RE | Admit: 2009-01-18 | Discharge: 2009-01-18 | Payer: Self-pay | Admitting: Cardiology

## 2009-01-18 ENCOUNTER — Ambulatory Visit: Payer: Self-pay | Admitting: Cardiology

## 2009-01-24 ENCOUNTER — Ambulatory Visit: Payer: Self-pay | Admitting: Cardiology

## 2009-01-24 LAB — CONVERTED CEMR LAB: POC INR: 1.8

## 2009-02-02 ENCOUNTER — Encounter: Payer: Self-pay | Admitting: *Deleted

## 2009-02-07 ENCOUNTER — Ambulatory Visit: Payer: Self-pay | Admitting: Cardiovascular Disease

## 2009-02-07 ENCOUNTER — Encounter (INDEPENDENT_AMBULATORY_CARE_PROVIDER_SITE_OTHER): Payer: Self-pay | Admitting: Cardiology

## 2009-02-07 LAB — CONVERTED CEMR LAB
POC INR: 3
Prothrombin Time: 21 s

## 2009-02-16 ENCOUNTER — Ambulatory Visit: Payer: Self-pay | Admitting: Cardiology

## 2009-03-07 ENCOUNTER — Ambulatory Visit: Payer: Self-pay | Admitting: Internal Medicine

## 2009-03-07 LAB — CONVERTED CEMR LAB: Prothrombin Time: 22.7 s

## 2009-03-09 ENCOUNTER — Emergency Department (HOSPITAL_COMMUNITY): Admission: EM | Admit: 2009-03-09 | Discharge: 2009-03-09 | Payer: Self-pay | Admitting: Family Medicine

## 2009-04-01 ENCOUNTER — Ambulatory Visit: Payer: Self-pay | Admitting: Internal Medicine

## 2009-04-01 LAB — CONVERTED CEMR LAB: POC INR: 5.7

## 2009-04-08 ENCOUNTER — Ambulatory Visit: Payer: Self-pay | Admitting: Internal Medicine

## 2009-04-19 ENCOUNTER — Encounter (INDEPENDENT_AMBULATORY_CARE_PROVIDER_SITE_OTHER): Payer: Self-pay | Admitting: *Deleted

## 2009-04-26 ENCOUNTER — Ambulatory Visit: Payer: Self-pay | Admitting: Cardiology

## 2009-05-03 ENCOUNTER — Ambulatory Visit: Payer: Self-pay | Admitting: Cardiology

## 2009-05-10 ENCOUNTER — Ambulatory Visit: Payer: Self-pay | Admitting: Cardiology

## 2009-05-10 LAB — CONVERTED CEMR LAB: POC INR: 4.1

## 2009-05-16 ENCOUNTER — Ambulatory Visit (HOSPITAL_COMMUNITY): Admission: RE | Admit: 2009-05-16 | Discharge: 2009-05-16 | Payer: Self-pay | Admitting: Internal Medicine

## 2009-05-26 ENCOUNTER — Ambulatory Visit: Payer: Self-pay

## 2009-05-26 ENCOUNTER — Ambulatory Visit: Payer: Self-pay | Admitting: Cardiology

## 2009-05-26 DIAGNOSIS — I6529 Occlusion and stenosis of unspecified carotid artery: Secondary | ICD-10-CM | POA: Insufficient documentation

## 2009-06-09 ENCOUNTER — Ambulatory Visit: Payer: Self-pay | Admitting: Internal Medicine

## 2009-06-09 ENCOUNTER — Ambulatory Visit: Payer: Self-pay | Admitting: Cardiology

## 2009-06-09 LAB — CONVERTED CEMR LAB: POC INR: 3.3

## 2009-06-13 LAB — CONVERTED CEMR LAB
Basophils Relative: 0.1 % (ref 0.0–3.0)
Eosinophils Relative: 1.8 % (ref 0.0–5.0)
GFR calc non Af Amer: 64.92 mL/min (ref >60)
Glucose, Bld: 103 mg/dL — ABNORMAL HIGH (ref 70–99)
HCT: 37.6 % — ABNORMAL LOW (ref 39.0–52.0)
Hemoglobin: 13 g/dL (ref 13.0–17.0)
Lymphocytes Relative: 14.7 % (ref 12.0–46.0)
Lymphs Abs: 1.1 10*3/uL (ref 0.7–4.0)
Monocytes Relative: 13.4 % — ABNORMAL HIGH (ref 3.0–12.0)
Neutro Abs: 5.6 10*3/uL (ref 1.4–7.7)
Potassium: 4.3 meq/L (ref 3.5–5.1)
RBC: 3.97 M/uL — ABNORMAL LOW (ref 4.22–5.81)
RDW: 12.9 % (ref 11.5–14.6)
Sodium: 140 meq/L (ref 135–145)

## 2009-06-30 ENCOUNTER — Ambulatory Visit: Payer: Self-pay | Admitting: Cardiology

## 2009-06-30 ENCOUNTER — Encounter (INDEPENDENT_AMBULATORY_CARE_PROVIDER_SITE_OTHER): Payer: Self-pay | Admitting: Cardiology

## 2009-08-01 ENCOUNTER — Ambulatory Visit: Payer: Self-pay | Admitting: Cardiology

## 2009-08-22 ENCOUNTER — Ambulatory Visit: Payer: Self-pay | Admitting: Cardiology

## 2009-08-22 LAB — CONVERTED CEMR LAB: POC INR: 2.8

## 2009-09-12 ENCOUNTER — Ambulatory Visit: Payer: Self-pay | Admitting: Cardiology

## 2009-09-12 LAB — CONVERTED CEMR LAB: POC INR: 2.7

## 2009-10-10 ENCOUNTER — Ambulatory Visit: Payer: Self-pay | Admitting: Cardiology

## 2009-10-10 LAB — CONVERTED CEMR LAB: POC INR: 2.7

## 2009-11-02 ENCOUNTER — Ambulatory Visit: Payer: Self-pay | Admitting: Cardiology

## 2009-11-02 LAB — CONVERTED CEMR LAB: POC INR: 2.4

## 2009-11-30 ENCOUNTER — Ambulatory Visit: Payer: Self-pay | Admitting: Cardiovascular Disease

## 2009-11-30 LAB — CONVERTED CEMR LAB: POC INR: 2.3

## 2009-12-14 ENCOUNTER — Ambulatory Visit: Payer: Self-pay | Admitting: Cardiology

## 2009-12-14 LAB — CONVERTED CEMR LAB: POC INR: 2.7

## 2010-01-11 ENCOUNTER — Ambulatory Visit: Payer: Self-pay | Admitting: Cardiology

## 2010-01-18 ENCOUNTER — Encounter: Payer: Self-pay | Admitting: Cardiology

## 2010-02-08 ENCOUNTER — Ambulatory Visit: Payer: Self-pay | Admitting: Cardiology

## 2010-02-22 ENCOUNTER — Ambulatory Visit: Payer: Self-pay | Admitting: Cardiology

## 2010-03-22 ENCOUNTER — Ambulatory Visit: Payer: Self-pay | Admitting: Cardiology

## 2010-04-11 ENCOUNTER — Encounter: Admission: RE | Admit: 2010-04-11 | Discharge: 2010-04-11 | Payer: Self-pay | Admitting: Internal Medicine

## 2010-04-19 ENCOUNTER — Ambulatory Visit: Payer: Self-pay | Admitting: Cardiology

## 2010-05-12 ENCOUNTER — Encounter: Payer: Self-pay | Admitting: Cardiology

## 2010-05-15 ENCOUNTER — Encounter: Payer: Self-pay | Admitting: Cardiology

## 2010-05-15 ENCOUNTER — Ambulatory Visit: Payer: Self-pay | Admitting: Cardiology

## 2010-05-15 ENCOUNTER — Ambulatory Visit: Payer: Self-pay

## 2010-05-15 LAB — CONVERTED CEMR LAB: POC INR: 4

## 2010-05-16 ENCOUNTER — Telehealth (INDEPENDENT_AMBULATORY_CARE_PROVIDER_SITE_OTHER): Payer: Self-pay | Admitting: *Deleted

## 2010-06-08 ENCOUNTER — Telehealth (INDEPENDENT_AMBULATORY_CARE_PROVIDER_SITE_OTHER): Payer: Self-pay | Admitting: *Deleted

## 2010-06-13 ENCOUNTER — Encounter: Admission: RE | Admit: 2010-06-13 | Discharge: 2010-06-13 | Payer: Self-pay | Admitting: Cardiology

## 2010-06-13 ENCOUNTER — Ambulatory Visit: Payer: Self-pay | Admitting: Cardiovascular Disease

## 2010-06-13 LAB — CONVERTED CEMR LAB: POC INR: 3.4

## 2010-07-11 ENCOUNTER — Ambulatory Visit: Payer: Self-pay | Admitting: Internal Medicine

## 2010-07-11 LAB — CONVERTED CEMR LAB: POC INR: 4.5

## 2010-07-27 ENCOUNTER — Ambulatory Visit: Payer: Self-pay | Admitting: Cardiology

## 2010-07-27 LAB — CONVERTED CEMR LAB

## 2010-08-10 ENCOUNTER — Ambulatory Visit: Admission: RE | Admit: 2010-08-10 | Discharge: 2010-08-10 | Payer: Self-pay | Source: Home / Self Care

## 2010-08-20 ENCOUNTER — Encounter: Payer: Self-pay | Admitting: Cardiothoracic Surgery

## 2010-08-20 ENCOUNTER — Encounter: Payer: Self-pay | Admitting: Thoracic Surgery (Cardiothoracic Vascular Surgery)

## 2010-08-24 ENCOUNTER — Ambulatory Visit: Admission: RE | Admit: 2010-08-24 | Discharge: 2010-08-24 | Payer: Self-pay | Source: Home / Self Care

## 2010-08-27 LAB — CONVERTED CEMR LAB
Basophils Absolute: 0 10*3/uL (ref 0.0–0.1)
Basophils Relative: 0.1 % (ref 0.0–3.0)
Calcium: 8.9 mg/dL (ref 8.4–10.5)
GFR calc non Af Amer: 71.87 mL/min (ref >60)
Hemoglobin: 14.4 g/dL (ref 13.0–17.0)
Lymphocytes Relative: 14.9 % (ref 12.0–46.0)
Monocytes Relative: 13.5 % — ABNORMAL HIGH (ref 3.0–12.0)
Neutro Abs: 6.3 10*3/uL (ref 1.4–7.7)
RBC: 4.39 M/uL (ref 4.22–5.81)
Sodium: 136 meq/L (ref 135–145)

## 2010-08-31 NOTE — Progress Notes (Signed)
  06/08/10--1145am--spoke to pt about scheduling another c. doppler in 6 months, also advised pt he needed comparison CXR  to determine reason for SOB --pt states he's sick today and can't come in for CXR, but has appoint in [facility] which he also missed, but he will call to reschedule and i will give [facility] order for CXR and put reminder in computer for 6 month f/u c. doppler--nt

## 2010-08-31 NOTE — Medication Information (Signed)
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 no    Recent/future dental: no  Any missed doses?: no       Is patient compliant with meds? yes       Allergies: 1)  ! Crestor (Rosuvastatin Calcium)  Anticoagulation Management History:      The patient is taking warfarin and comes in today for a routine follow up visit.  Negative risk factors for bleeding include an age less than 29 years old.  The bleeding index is 'low risk'.  Positive CHADS2 values include History of HTN.  Negative CHADS2 values include Age > 24 years old.  The start date was 09/12/2004.  His last INR was 5.6 ratio.  Anticoagulation responsible provider: Patty Sermons, MD .  INR POC: 2.6.  Cuvette Lot#: [id number].  Exp: 07/2011.    Anticoagulation Management Assessment/Plan:      The patient's current anticoagulation dose is Warfarin sodium 5 mg tabs: Use as directed by Anticoagulation Clinic.  The target INR is 2.5-3.5.  The next INR is due 09/21/2010.  Anticoagulation instructions were given to patient.  Results were reviewed/authorized by Weston Brass, PharmD.  He was notified by Stephannie Peters, PharmD Candidate .         Prior Anticoagulation Instructions: INR 3.3 Continue 1.5 pills everyday except 1 pill on Mondays and Fridays. Recheck in 2 weeks.   Current Anticoagulation Instructions: INR 2.6  Coumadin 5mg  tablets - Continue 1.5 tablets every day except 1 tablet on Mondays and Fridays

## 2010-08-31 NOTE — Medication Information (Signed)
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 no    Recent/future dental: no  Any missed doses?: no       Is patient compliant with meds? yes       Current Medications (verified): 1)  Flovent Diskus 100 Mcg/blist Aepb (Fluticasone Propionate (Inhal)) .... 4 Times A Day 2)  Atrovent Hfa 17 Mcg/act Aers (Ipratropium Bromide Hfa) .... 4 Times A Day 3)  Furosemide 20 Mg Tabs (Furosemide) .... Take One Tablet By Mouth Daily. 4)  Zetia 10 Mg Tabs (Ezetimibe) .... Take One Tablet By Mouth Daily. 5)  Altace 5 Mg Tabs (Ramipril) .... Take 1 Tablet By Mouth Once A Day 6)  Klor-Con M20 20 Meq Cr-Tabs (Potassium Chloride Crys Cr) .... Take 1 Tablet By Mouth Once A Day 7)  Metoprolol Tartrate 25 Mg Tabs (Metoprolol Tartrate) .... Take One Tablet By Mouth Twice A Day 8)  Zolpidem Tartrate 5 Mg Tabs (Zolpidem Tartrate) .... As Needed 9)  Cyclobenzaprine Hcl 5 Mg Tabs (Cyclobenzaprine Hcl) .... As Needed 10)  Ferrous Sulfate 325 (65 Fe) Mg Tabs (Ferrous Sulfate) .... Take 1 Tablet By Mouth Once A Day 11)  Omeprazole 20 Mg Tbec (Omeprazole) .... Take 1 Tablet By Mouth Once A Day 12)  Warfarin Sodium 5 Mg Tabs (Warfarin Sodium) .... Use As Directed By Anticoagulation Clinic 13)  Hydrocodone-Acetaminophen 5-500 Mg Tabs (Hydrocodone-Acetaminophen) .... As Needed 14)  Vitamin D3 1000 Unit Caps (Cholecalciferol) .... Take 1 Capsule By Mouth Once A Day  Allergies (verified): 1)  ! Crestor (Rosuvastatin Calcium)  Anticoagulation Management  History:      The patient is taking warfarin and comes in today for a routine follow up visit.  Negative risk factors for bleeding include an age less than 105 years old.  The bleeding index is 'low risk'.  Positive CHADS2 values include History of HTN.  Negative CHADS2 values include Age > 90 years old.  The start date was 09/12/2004.  His last INR was 5.6 ratio.  Anticoagulation responsible provider: Riley Kill MD, Maisie Fus.  INR POC: 2.2.  Exp: 08/2010.    Anticoagulation Management Assessment/Plan:      The patient's current anticoagulation dose is Warfarin sodium 5 mg tabs: Use as directed by Anticoagulation Clinic.  The target INR is 2.5-3.5.  The next INR is due 08/22/2009.  Anticoagulation instructions were given to patient.  Results were reviewed/authorized by Cloyde Reams, RN, BSN.  He was notified by Cloyde Reams RN.         Prior Anticoagulation Instructions: INR 3.2 Continue 1.5 tabs daily except 1 tab on Mondays and Fridays. Recheck in 4 weeks.    Current Anticoagulation Instructions: INR 2.2  Take 1.5 tablets today, then resume same dosage 1.5 tablets daily except 1 tablet on Mondays and Fridays.  Recheck in 3 weeks.

## 2010-08-31 NOTE — Progress Notes (Signed)
  Walk in Patient Form Recieved "  Pt left letter for Stuckey/Lauren to review" sent to Blanchard Mane [place]  May 16, 2010 1:23 PM

## 2010-08-31 NOTE — Medication Information (Signed)
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 no    Recent/future dental: no  Any missed doses?: no       Is patient compliant with meds? yes       Allergies: 1)  ! Crestor (Rosuvastatin Calcium)  Anticoagulation Management History:      The patient is taking warfarin and comes in today for a routine follow up visit.  Negative risk factors for bleeding include an age less than 12 years old.  The bleeding index is 'low risk'.  Positive CHADS2 values include History of HTN.  Negative CHADS2 values include Age > 35 years old.  The start date was 09/12/2004.  His last INR was 5.6 ratio.  Anticoagulation responsible Kensley Lares: Daleen Squibb MD, Maisie Fus.  INR POC: 4.0.  Cuvette Lot#: [id number].  Exp: 05/2011.    Anticoagulation Management Assessment/Plan:      The patient's current anticoagulation dose is Warfarin sodium 5 mg tabs: Use as directed by Anticoagulation Clinic.  The target INR is 2.5-3.5.  The next INR is due 06/05/2010.  Anticoagulation instructions were given to patient.  Results were reviewed/authorized by Cloyde Reams, RN, BSN.  He was notified by Cloyde Reams RN.         Prior Anticoagulation Instructions: INR 3.2  No changes. Continue taking 1 and a half tablets everyday except friday. On friday take 1 tablet. Recheck in 4 weeks.  Current Anticoagulation Instructions: INR 4.0  Skip today's dosage of Coumadin, then resume same dosage 1.5 tablets daily except 1 tablet on Fridays.  Recheck in 3-4 weeks.

## 2010-08-31 NOTE — Medication Information (Signed)
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 no    Recent/future dental: no  Any missed doses?: no       Is patient compliant with meds? yes       Allergies: 1)  ! Crestor (Rosuvastatin Calcium)  Anticoagulation Management History:      The patient is taking warfarin and comes in today for a routine follow up visit.  Negative risk factors for bleeding include an age less than 67 years old.  The bleeding index is 'low risk'.  Positive CHADS2 values include History of HTN.  Negative CHADS2 values include Age > 91 years old.  The start date was 09/12/2004.  His last INR was 5.6 ratio.  Anticoagulation responsible Toney Lizaola: Jens Som MD, Arlys John.  INR POC: INR 5.0.  Cuvette Lot#: [id number].  Exp: 04/2011.    Anticoagulation Management Assessment/Plan:      The patient's current anticoagulation dose is Warfarin sodium 5 mg tabs: Use as directed by Anticoagulation Clinic.  The target INR is 2.5-3.5.  The next INR is due 08/10/2010.  Anticoagulation instructions were given to patient.  Results were reviewed/authorized by Cloyde Reams, RN, BSN.  He was notified by Cloyde Reams RN.         Prior Anticoagulation Instructions: INR 4.5 Skip today's dose then resume 1.5 pill everyday except 1 pill on Fridays. Recheck in 2 weeks.   Current Anticoagulation Instructions: INR 5.0  Skip 2 doses of Coumadin, then start taking 1.5 tablets daily except 1 tablet on Mondays and Fridays.  Recheck in 10 days-2 weeks.

## 2010-08-31 NOTE — Assessment & Plan Note (Signed)
Summary: f8m   Visit Type:  3 months follow up Primary Provider:  [name]  CC:  No complains.  History of Present Illness: No real chest pain.  Some shortness of breath, but that is no different in past.   Feels ok.  Remains on iron.  Records reviewed.  Had antral ulcer, and ?ischemic colitis.  Current Medications (verified): 1)  Flovent Diskus 100 Mcg/blist Aepb (Fluticasone Propionate (Inhal)) .... 4 Times A Day 2)  Atrovent Hfa 17 Mcg/act Aers (Ipratropium Bromide Hfa) .... 4 Times A Day 3)  Furosemide 20 Mg Tabs (Furosemide) .... Take One Tablet By Mouth Daily. 4)  Zetia 10 Mg Tabs (Ezetimibe) .... Take One Tablet By Mouth Daily. 5)  Altace 5 Mg Tabs (Ramipril) .... Take 1 Tablet By Mouth Once A Day 6)  Klor-Con M20 20 Meq Cr-Tabs (Potassium Chloride Crys Cr) .... Take 1 Tablet By Mouth Once A Day 7)  Metoprolol Tartrate 25 Mg Tabs (Metoprolol Tartrate) .... Take One Tablet By Mouth Twice A Day 8)  Zolpidem Tartrate 5 Mg Tabs (Zolpidem Tartrate) .... As Needed 9)  Cyclobenzaprine Hcl 5 Mg Tabs (Cyclobenzaprine Hcl) .... As Needed 10)  Ferrous Sulfate 325 (65 Fe) Mg Tabs (Ferrous Sulfate) .... Take 1 Tablet By Mouth Once A Day 11)  Omeprazole 20 Mg Tbec (Omeprazole) .... Take 1 Tablet By Mouth Once A Day 12)  Warfarin Sodium 5 Mg Tabs (Warfarin Sodium) .... Use As Directed By Anticoagulation Clinic 13)  Hydrocodone-Acetaminophen 5-500 Mg Tabs (Hydrocodone-Acetaminophen) .... As Needed 14)  Vitamin D3 1000 Unit Caps (Cholecalciferol) .... Take 1 Capsule By Mouth Once A Day  Allergies: 1)  ! Crestor (Rosuvastatin Calcium)  Vital Signs:  Patient profile:   65 year old male Height:      69 inches Weight:      187 pounds BMI:     27.71 Pulse rate:   64 / minute Pulse rhythm:   regular Resp:     18 per minute BP sitting:   128 / 82  (left arm) Cuff size:   large  Vitals Entered By: Vikki Ports (November 02, 2009 9:23 AM)  Physical Exam  General:  Well developed, well  nourished, in no acute distress. Head:  normocephalic and atraumatic Lungs:  Clear bilaterally to auscultation and percussion. Heart:  Normal S1 and S2.  regular.  NO  MURMUR.   Abdomen:  Bowel sounds positive; abdomen soft and non-tender without masses, organomegaly, or hernias noted. No hepatosplenomegaly. Pulses:  pulses normal in all 4 extremities Extremities:  No clubbing or cyanosis. Neurologic:  Alert and oriented x 3.   EKG  Procedure date:  11/02/2009  Findings:      Normal Sinus rhythm.  PR 202,  no acute changes, or atrial fibrillation.  Echocardiogram  Procedure date:  10/20/2008  Findings:       SUMMARY   -  Overall left ventricular systolic function was normal. Left         ventricular ejection fraction was estimated to be 55 %. There         were no left ventricular regional wall motion abnormalities.   -  There was trivial aortic valvular regurgitation.   -  There was mild aortic root dilatation.   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 There was mild mitral         valvular regurgitation. Mean transmitral gradient was 4 mmHg.         Mitral valve area by  pressure half-time was 2.86 cm^2.   -  The left atrium was mild to moderately dilated.  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  Remains on warfarin.  Etiology was thought to be left atrial flutter, ? prior surgery. His updated medication list for this problem includes:    Metoprolol Tartrate 25 Mg Tabs (Metoprolol tartrate) .Marland Kitchen... Take one tablet by mouth twice a day    Warfarin Sodium 5 Mg Tabs (Warfarin sodium) ..... 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  Had echo one year ago, and exam is benign.   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  Will defer to Dr. Renae Gloss.  Patient Instructions: 1)  Your physician recommends that you continue on your current medications as directed. Please refer to the Current Medication list given to you today. 2)  Your physician wants you to follow-up in:  6 MONTHS.  You will receive a reminder letter in the mail two months in advance. If you don't receive a letter, please call our office to schedule the follow-up appointment. 3)  Your physician has requested that you have a carotid duplex in 6 MONTHS.  This test is an ultrasound of the carotid arteries in your neck. It looks at blood flow through these arteries that supply the brain with blood. Allow one hour for this exam. There are no restrictions or special instructions.

## 2010-08-31 NOTE — Medication Information (Signed)
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 no    Recent/future dental: no  Any missed doses?: no       Is patient compliant with meds? yes       Allergies: 1)  ! Crestor (Rosuvastatin Calcium)  Anticoagulation Management History:      The patient is taking warfarin and comes in today for a routine follow up visit.  Negative risk factors for bleeding include an age less than 35 years old.  The bleeding index is 'low risk'.  Positive CHADS2 values include History of HTN.  Negative CHADS2 values include Age > 88 years old.  The start date was 09/12/2004.  His last INR was 5.6 ratio.  Anticoagulation responsible provider: Shirlee Latch MD, Dalton.  INR POC: 3.2.  Cuvette Lot#: [id number].  Exp: 04/2011.    Anticoagulation Management Assessment/Plan:      The patient's current anticoagulation dose is Warfarin sodium 5 mg tabs: Use as directed by Anticoagulation Clinic.  The target INR is 2.5-3.5.  The next INR is due 05/15/2010.  Anticoagulation instructions were given to patient.  Results were reviewed/authorized by Lyna Poser, PharmD.         Prior Anticoagulation Instructions: INR 3.0  Continue 1.5 tablets daily except 1 tablet on Fridays.  Return to clinic in 4 weeks.  Current Anticoagulation Instructions: INR 3.2  No changes. Continue taking 1 and a half tablets everyday except friday. On friday take 1 tablet. Recheck in 4 weeks.

## 2010-08-31 NOTE — Medication Information (Signed)
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 no    Recent/future dental: no  Any missed doses?: no       Is patient compliant with meds? yes       Allergies: 1)  ! Crestor (Rosuvastatin Calcium)  Anticoagulation Management History:      The patient is taking warfarin and comes in today for a routine follow up visit.  Negative risk factors for bleeding include an age less than 58 years old.  The bleeding index is 'low risk'.  Positive CHADS2 values include History of HTN.  Negative CHADS2 values include Age > 47 years old.  The start date was 09/12/2004.  His last INR was 5.6 ratio.  Anticoagulation responsible provider: Eden Emms MD, Theron Arista.  INR POC: 2.3.  Cuvette Lot#: [id number].  Exp: 12/2010.    Anticoagulation Management Assessment/Plan:      The patient's current anticoagulation dose is Warfarin sodium 5 mg tabs: Use as directed by Anticoagulation Clinic.  The target INR is 2.5-3.5.  The next INR is due 12/14/2009.  Anticoagulation instructions were given to patient.  Results were reviewed/authorized by Bethena Midget, RN, BSN.  He was notified by Bethena Midget, RN, BSN.         Prior Anticoagulation Instructions: INR 2.4  Take 2 tablets today, then resume same dosage 1.5 tablets daily except 1 tablet on Mondays and Fridays.  Recheck in 4 weeks.    Current Anticoagulation Instructions: INR 2.3 Today take 2 pills then change dose to 1.5 pills everyday except 1 pill on Mondays. Recheck in 2 weeks.

## 2010-08-31 NOTE — Medication Information (Signed)
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 no    Recent/future dental: no  Any missed doses?: no       Is patient compliant with meds? yes       Allergies: 1)  ! Crestor (Rosuvastatin Calcium)  Anticoagulation Management History:      The patient is taking warfarin and comes in today for a routine follow up visit.  Negative risk factors for bleeding include an age less than 51 years old.  The bleeding index is 'low risk'.  Positive CHADS2 values include History of HTN.  Negative CHADS2 values include Age > 2 years old.  The start date was 09/12/2004.  His last INR was 5.6 ratio.  Anticoagulation responsible Neyland Pettengill: Shirlee Latch MD, Dalton.  INR POC: 3.0.  Cuvette Lot#: [id number].  Exp: 04/2011.    Anticoagulation Management Assessment/Plan:      The patient's current anticoagulation dose is Warfarin sodium 5 mg tabs: Use as directed by Anticoagulation Clinic.  The target INR is 2.5-3.5.  The next INR is due 04/19/2010.  Anticoagulation instructions were given to patient.  Results were reviewed/authorized by Weston Brass, PharmD.  He was notified by Liana Gerold, PharmD Candidate.         Prior Anticoagulation Instructions: INR 3.2  Continue same dose of 1.5 tabs daily except for 1 tab on Friday.  Re-check in 4 weeks.  Current Anticoagulation Instructions: INR 3.0  Continue 1.5 tablets daily except 1 tablet on Fridays.  Return to clinic in 4 weeks.

## 2010-08-31 NOTE — Medication Information (Signed)
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 no    Recent/future dental: no  Any missed doses?: no       Is patient compliant with meds? yes       Allergies: 1)  ! Crestor (Rosuvastatin Calcium)  Anticoagulation Management History:      The patient is taking warfarin and comes in today for a routine follow up visit.  Negative risk factors for bleeding include an age less than 85 years old.  The bleeding index is 'low risk'.  Positive CHADS2 values include History of HTN.  Negative CHADS2 values include Age > 71 years old.  The start date was 09/12/2004.  His last INR was 5.6 ratio.  Anticoagulation responsible provider: Jens Som MD, Arlys John.  INR POC: 2.7.  Cuvette Lot#: [id number].  Exp: 02/2011.    Anticoagulation Management Assessment/Plan:      The patient's current anticoagulation dose is Warfarin sodium 5 mg tabs: Use as directed by Anticoagulation Clinic.  The target INR is 2.5-3.5.  The next INR is due 01/11/2010.  Anticoagulation instructions were given to patient.  Results were reviewed/authorized by Weston Brass, PharmD.  He was notified by Weston Brass PharmD.         Prior Anticoagulation Instructions: INR 2.3 Today take 2 pills then change dose to 1.5 pills everyday except 1 pill on Mondays. Recheck in 2 weeks.   Current Anticoagulation Instructions: INR 2.7  Continue same dose of 1 1/2 tablets every day except 1 tablet on Monday

## 2010-08-31 NOTE — Letter (Signed)
Summary: [facility]  [facility]   Imported By: Marylou Mccoy 06/20/2010 15:59:17  _____________________________________________________________________  External Attachment:    Type:   Image     Comment:   External Document

## 2010-08-31 NOTE — Medication Information (Signed)
Summary: rov/sp  Anticoagulant Therapy  Managed by: Bethena Midget, RN, BSN Referring MD: Shawnie Pons MD PCP: [name] Supervising MD: Myrtis Ser MD, Tinnie Gens Indication 1: Atrial Fibrillation (ICD-427.31) Indication 2: Mitral Valve Replacement (ICD-V43.3) Lab Used: [facility] Site: [facility] INR POC 2.6 INR RANGE 2.5 - 3.5  Dietary changes: no    Health status changes: no    Bleeding/hemorrhagic complications: no    Recent/future hospitalizations: no    Any changes in medication regimen? no    Recent/future dental: no  Any missed doses?: no       Is patient compliant with meds? yes      Comments: Pt states he takes 5mg s on Fridays not Mondays.  Allergies: 1)  ! Crestor (Rosuvastatin Calcium)  Anticoagulation Management History:      The patient is taking warfarin and comes in today for a routine follow up visit.  Negative risk factors for bleeding include an age less than 27 years old.  The bleeding index is 'low risk'.  Positive CHADS2 values include History of HTN.  Negative CHADS2 values include Age > 79 years old.  The start date was 09/12/2004.  His last INR was 5.6 ratio.  Anticoagulation responsible provider: Myrtis Ser MD, Tinnie Gens.  INR POC: 2.6.  Cuvette Lot#: [id number].  Exp: 02/2011.    Anticoagulation Management Assessment/Plan:      The patient's current anticoagulation dose is Warfarin sodium 5 mg tabs: Use as directed by Anticoagulation Clinic.  The target INR is 2.5-3.5.  The next INR is due 02/08/2010.  Anticoagulation instructions were given to patient.  Results were reviewed/authorized by Bethena Midget, RN, BSN.  He was notified by Bethena Midget, RN, BSN.         Prior Anticoagulation Instructions: INR 2.7  Continue same dose of 1 1/2 tablets every day except 1 tablet on Monday   Current Anticoagulation Instructions: INR 2.6 Continue 7.5mg s everyday except 5mg  on Fridays. Recheck in 4 weeks.

## 2010-08-31 NOTE — Medication Information (Signed)
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 no    Recent/future dental: no  Any missed doses?: no       Is patient compliant with meds? yes       Allergies (verified): 1)  ! Crestor (Rosuvastatin Calcium)  Anticoagulation Management History:      The patient is taking warfarin and comes in today for a routine follow up visit.  Negative risk factors for bleeding include an age less than 66 years old.  The bleeding index is 'low risk'.  Positive CHADS2 values include History of HTN.  Negative CHADS2 values include Age > 30 years old.  The start date was 09/12/2004.  His last INR was 5.6 ratio.  Anticoagulation responsible provider: Jens Som MD, Arlys John.  INR POC: 2.4.  Cuvette Lot#: [id number].  Exp: 11/2010.    Anticoagulation Management Assessment/Plan:      The patient's current anticoagulation dose is Warfarin sodium 5 mg tabs: Use as directed by Anticoagulation Clinic.  The target INR is 2.5-3.5.  The next INR is due 11/30/2009.  Anticoagulation instructions were given to patient.  Results were reviewed/authorized by Cloyde Reams, RN, BSN.  He was notified by Cloyde Reams RN.         Prior Anticoagulation Instructions: INR 2.7  Continue taking 1 tablet on Monday and Friday and 1.5 tablets all other days.  Return to clinic in 4 weeks.   Current Anticoagulation Instructions: INR 2.4  Take 2 tablets today, then resume same dosage 1.5 tablets daily except 1 tablet on Mondays and Fridays.  Recheck in 4 weeks.

## 2010-08-31 NOTE — Medication Information (Signed)
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 no    Recent/future dental: no  Any missed doses?: no       Is patient compliant with meds? yes       Allergies: 1)  ! Crestor (Rosuvastatin Calcium)  Anticoagulation Management History:      The patient is taking warfarin and comes in today for a routine follow up visit.  Negative risk factors for bleeding include an age less than 33 years old.  The bleeding index is 'low risk'.  Positive CHADS2 values include History of HTN.  Negative CHADS2 values include Age > 23 years old.  The start date was 09/12/2004.  His last INR was 5.6 ratio.  Anticoagulation responsible provider: Antoine Poche MD, Fayrene Fearing.  INR POC: 2.8.  Cuvette Lot#: [id number].  Exp: 10/2010.    Anticoagulation Management Assessment/Plan:      The patient's current anticoagulation dose is Warfarin sodium 5 mg tabs: Use as directed by Anticoagulation Clinic.  The target INR is 2.5-3.5.  The next INR is due 09/12/2009.  Anticoagulation instructions were given to patient.  Results were reviewed/authorized by Lew Dawes, PharmD Candidate.  He was notified by Lew Dawes, PharmD Candidate.         Prior Anticoagulation Instructions: INR 2.2  Take 1.5 tablets today, then resume same dosage 1.5 tablets daily except 1 tablet on Mondays and Fridays.  Recheck in 3 weeks.    Current Anticoagulation Instructions: INR 2.8  Continue same dose of 1.5 tablets daily except 1 tablet on Mondays and Fridays. Recheck in 3 weeks.

## 2010-08-31 NOTE — Medication Information (Signed)
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 no    Recent/future dental: no  Any missed doses?: no       Is patient compliant with meds? yes       Allergies: 1)  ! Crestor (Rosuvastatin Calcium)  Anticoagulation Management History:      The patient is taking warfarin and comes in today for a routine follow up visit.  Negative risk factors for bleeding include an age less than 73 years old.  The bleeding index is 'low risk'.  Positive CHADS2 values include History of HTN.  Negative CHADS2 values include Age > 67 years old.  The start date was 09/12/2004.  His last INR was 5.6 ratio.  Anticoagulation responsible provider: Shirlee Latch MD, Camdyn Beske.  INR POC: 2.7.  Cuvette Lot#: [id number].  Exp: 10/2010.    Anticoagulation Management Assessment/Plan:      The patient's current anticoagulation dose is Warfarin sodium 5 mg tabs: Use as directed by Anticoagulation Clinic.  The target INR is 2.5-3.5.  The next INR is due 10/10/2009.  Anticoagulation instructions were given to patient.  Results were reviewed/authorized by Eda Keys, PharmD.  He was notified by Eda Keys.         Prior Anticoagulation Instructions: INR 2.8  Continue same dose of 1.5 tablets daily except 1 tablet on Mondays and Fridays. Recheck in 3 weeks.  Current Anticoagulation Instructions: INR 2.7  Continue current dosing schedule.  Take 1 tablet on Monday and Friday and take 1.5 tablets all other days. Return to clinic in 4 weeks.

## 2010-08-31 NOTE — Assessment & Plan Note (Signed)
Summary: f30m   Visit Type:  Follow-up Primary Provider:  [name]  CC:  no xomplaints.  History of Present Illness: Overall doing well.  Been deer huntin.  Stood up in stand without difficulty.   He was a little short of breath when twenty feet off the ground.  No chest pain.  Current Medications (verified): 1)  Flovent Diskus 100 Mcg/blist Aepb (Fluticasone Propionate (Inhal)) .... 4 Times A Day 2)  Atrovent Hfa 17 Mcg/act Aers (Ipratropium Bromide Hfa) .... 4 Times A Day 3)  Furosemide 20 Mg Tabs (Furosemide) .... Take One Tablet By Mouth Daily. 4)  Zetia 10 Mg Tabs (Ezetimibe) .... Take One Tablet By Mouth Daily. 5)  Altace 5 Mg Tabs (Ramipril) .... Take 1 Tablet By Mouth Once A Day 6)  Klor-Con M20 20 Meq Cr-Tabs (Potassium Chloride Crys Cr) .... Take 1 Tablet By Mouth Once A Day 7)  Metoprolol Tartrate 25 Mg Tabs (Metoprolol Tartrate) .... Take One Tablet By Mouth Twice A Day 8)  Zolpidem Tartrate 5 Mg Tabs (Zolpidem Tartrate) .... As Needed 9)  Cyclobenzaprine Hcl 5 Mg Tabs (Cyclobenzaprine Hcl) .... As Needed 10)  Ferrous Sulfate 325 (65 Fe) Mg Tabs (Ferrous Sulfate) .... Take 1 Tablet By Mouth Once A Day 11)  Omeprazole 20 Mg Tbec (Omeprazole) .... Take 1 Tablet By Mouth Once A Day 12)  Warfarin Sodium 5 Mg Tabs (Warfarin Sodium) .... Use As Directed By Anticoagulation Clinic 13)  Hydrocodone-Acetaminophen 5-500 Mg Tabs (Hydrocodone-Acetaminophen) .... As Needed 14)  Vitamin D3 1000 Unit Caps (Cholecalciferol) .... Take 1 Capsule By Mouth Once A Day  Allergies: 1)  ! 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 Lungs:  Clear bilaterally to auscultation and percussion. Heart:  No definite murmur.  Sounds regular. Abdomen:  Bowel sounds positive; abdomen soft and non-tender without masses, organomegaly, or hernias noted. No hepatosplenomegaly. Extremities:  No clubbing or cyanosis.  No edema.   EKG  Procedure date:  08/01/2009  Findings:      NSR with PAC's.  Occasional PVC.    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  Continue current medications. His updated medication list for this problem includes:    Metoprolol Tartrate 25 Mg Tabs (Metoprolol tartrate) .Marland Kitchen... Take one tablet by mouth twice a day    Warfarin Sodium 5 Mg Tabs (Warfarin sodium) ..... 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  Patient Instructions: 1)  Your physician recommends that you schedule a follow-up appointment in: 3  MONTHS 2)  Your physician recommends that you continue on your current medications as directed. Please refer to the Current Medication list given to you today.

## 2010-08-31 NOTE — Medication Information (Signed)
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 no    Recent/future dental: no  Any missed doses?: no       Is patient compliant with meds? yes       Allergies: 1)  ! Crestor (Rosuvastatin Calcium)  Anticoagulation Management History:      The patient is taking warfarin and comes in today for a routine follow up visit.  Negative risk factors for bleeding include an age less than 1 years old.  The bleeding index is 'low risk'.  Positive CHADS2 values include History of HTN.  Negative CHADS2 values include Age > 52 years old.  The start date was 09/12/2004.  His last INR was 5.6 ratio.  Anticoagulation responsible provider: Shirlee Latch MD, Marcellus Pulliam.  INR POC: 5.0.  Cuvette Lot#: [id number].  Exp: 03/2011.    Anticoagulation Management Assessment/Plan:      The patient's current anticoagulation dose is Warfarin sodium 5 mg tabs: Use as directed by Anticoagulation Clinic.  The target INR is 2.5-3.5.  The next INR is due 02/22/2010.  Anticoagulation instructions were given to patient.  Results were reviewed/authorized by Weston Brass, PharmD.  He was notified by Weston Brass PharmD.         Prior Anticoagulation Instructions: INR 2.6 Continue 7.5mg s everyday except 5mg  on Fridays. Recheck in 4 weeks.   Current Anticoagulation Instructions: INR 5.0  Skip today's dose of Coumadin, take 1/2 tablet tomorrow then resume same dose of 1 1/2 tablets every day except 1 tablet on Friday.

## 2010-08-31 NOTE — Miscellaneous (Signed)
Summary: Orders Update  Clinical Lists Changes  Orders: Added new Test order of Carotid Duplex (Carotid Duplex) - Signed 

## 2010-08-31 NOTE — Medication Information (Signed)
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 yes       Details: started allopurinol 300mg  twice daily on 7/25  Recent/future dental: no  Any missed doses?: no       Is patient compliant with meds? yes       Current Medications (verified): 1)  Flovent Diskus 100 Mcg/blist Aepb (Fluticasone Propionate (Inhal)) .... 4 Times A Day 2)  Atrovent Hfa 17 Mcg/act Aers (Ipratropium Bromide Hfa) .... 4 Times A Day 3)  Furosemide 20 Mg Tabs (Furosemide) .... Take One Tablet By Mouth Daily. 4)  Zetia 10 Mg Tabs (Ezetimibe) .... Take One Tablet By Mouth Daily. 5)  Altace 5 Mg Tabs (Ramipril) .... Take 1 Tablet By Mouth Once A Day 6)  Klor-Con M20 20 Meq Cr-Tabs (Potassium Chloride Crys Cr) .... Take 1 Tablet By Mouth Once A Day 7)  Metoprolol Tartrate 25 Mg Tabs (Metoprolol Tartrate) .... Take One Tablet By Mouth Twice A Day 8)  Zolpidem Tartrate 5 Mg Tabs (Zolpidem Tartrate) .... As Needed 9)  Cyclobenzaprine Hcl 5 Mg Tabs (Cyclobenzaprine Hcl) .... As Needed 10)  Ferrous Sulfate 325 (65 Fe) Mg Tabs (Ferrous Sulfate) .... Take 1 Tablet By Mouth Once A Day 11)  Omeprazole 20 Mg Tbec (Omeprazole) .... Take 1 Tablet By Mouth Once A Day 12)  Warfarin Sodium 5 Mg Tabs (Warfarin Sodium) .... Use As Directed By Anticoagulation Clinic 13)  Hydrocodone-Acetaminophen 5-500 Mg Tabs (Hydrocodone-Acetaminophen) .... As Needed 14)  Vitamin D3 1000 Unit Caps (Cholecalciferol) .... Take 1 Capsule By Mouth Once A Day 15)  Allopurinol 300 Mg Tabs (Allopurinol)  .... Take 1 Tablet By Mouth Twice Daily  Allergies (verified): 1)  ! Crestor (Rosuvastatin Calcium)  Anticoagulation Management History:      The patient is taking warfarin and comes in today for a routine follow up visit.  Negative risk factors for bleeding include an age less than 44 years old.  The bleeding index is 'low risk'.  Positive CHADS2 values include History of HTN.  Negative CHADS2 values include Age > 41 years old.  The start date was 09/12/2004.  His last INR was 5.6 ratio.  Anticoagulation responsible provider: Shirlee Latch MD, Jessyka Austria.  INR POC: 3.2.  Cuvette Lot#: [id number].  Exp: 04/2011.    Anticoagulation Management Assessment/Plan:      The patient's current anticoagulation dose is Warfarin sodium 5 mg tabs: Use as directed by Anticoagulation Clinic.  The target INR is 2.5-3.5.  The next INR is due 03/22/2010.  Anticoagulation instructions were given to patient.  Results were reviewed/authorized by Weston Brass, PharmD.  He was notified by Dillard Cannon.         Prior Anticoagulation Instructions: INR 5.0  Skip today's dose of Coumadin, take 1/2 tablet tomorrow then resume same dose of 1 1/2 tablets every day except 1 tablet on Friday.   Current Anticoagulation Instructions: INR 3.2  Continue same dose of 1.5 tabs daily except for 1 tab on Friday.  Re-check in 4 weeks.

## 2010-08-31 NOTE — Medication Information (Signed)
Summary: rov/eac  Anticoagulant Therapy  Managed by: Eda Keys, PharmD Referring MD: Shawnie Pons MD PCP: [name] Supervising MD: Myrtis Ser MD, Tinnie Gens Indication 1: Atrial Fibrillation (ICD-427.31) Indication 2: Mitral Valve Replacement (ICD-V43.3) Lab Used: [facility] Site: [facility] INR POC 2.7 INR RANGE 2.5 - 3.5  Dietary changes: no    Health status changes: no    Bleeding/hemorrhagic complications: no    Recent/future hospitalizations: no    Any changes in medication regimen? no    Recent/future dental: no  Any missed doses?: no       Is patient compliant with meds? yes       Allergies: 1)  ! Crestor (Rosuvastatin Calcium)  Anticoagulation Management History:      The patient is taking warfarin and comes in today for a routine follow up visit.  Negative risk factors for bleeding include an age less than 62 years old.  The bleeding index is 'low risk'.  Positive CHADS2 values include History of HTN.  Negative CHADS2 values include Age > 66 years old.  The start date was 09/12/2004.  His last INR was 5.6 ratio.  Anticoagulation responsible provider: Myrtis Ser MD, Tinnie Gens.  INR POC: 2.7.  Cuvette Lot#: [id number].  Exp: 11/2010.    Anticoagulation Management Assessment/Plan:      The patient's current anticoagulation dose is Warfarin sodium 5 mg tabs: Use as directed by Anticoagulation Clinic.  The target INR is 2.5-3.5.  The next INR is due 11/07/2009.  Anticoagulation instructions were given to patient.  Results were reviewed/authorized by Eda Keys, PharmD.  He was notified by Eda Keys.         Prior Anticoagulation Instructions: INR 2.7  Continue current dosing schedule.  Take 1 tablet on Monday and Friday and take 1.5 tablets all other days. Return to clinic in 4 weeks.  Current Anticoagulation Instructions: INR 2.7  Continue taking 1 tablet on Monday and Friday and 1.5 tablets all other days.  Return to clinic in 4 weeks.

## 2010-08-31 NOTE — Medication Information (Signed)
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 no    Recent/future dental: no  Any missed doses?: no       Is patient compliant with meds? yes       Allergies: 1)  ! Crestor (Rosuvastatin Calcium)  Anticoagulation Management History:      The patient is taking warfarin and comes in today for a routine follow up visit.  Negative risk factors for bleeding include an age less than 58 years old.  The bleeding index is 'low risk'.  Positive CHADS2 values include History of HTN.  Negative CHADS2 values include Age > 48 years old.  The start date was 09/12/2004.  His last INR was 5.6 ratio.  Anticoagulation responsible provider: Excell Seltzer MD, Casimiro Needle.  INR POC: 3.4.  Cuvette Lot#: [id number].  Exp: 06/2011.    Anticoagulation Management Assessment/Plan:      The patient's current anticoagulation dose is Warfarin sodium 5 mg tabs: Use as directed by Anticoagulation Clinic.  The target INR is 2.5-3.5.  The next INR is due 07/11/2010.  Anticoagulation instructions were given to patient.  Results were reviewed/authorized by Weston Brass, PharmD.  He was notified by Hoy Register, PharmD Candidate.         Prior Anticoagulation Instructions: INR 4.0  Skip today's dosage of Coumadin, then resume same dosage 1.5 tablets daily except 1 tablet on Fridays.  Recheck in 3-4 weeks.    Current Anticoagulation Instructions: INR 3.4 Continue previous dose 1.5 tablets everyday except 1 tablet on Friday Recheck INR in 4 weeks

## 2010-08-31 NOTE — Medication Information (Signed)
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 no    Recent/future dental: no  Any missed doses?: no       Is patient compliant with meds? yes       Allergies: 1)  ! Crestor (Rosuvastatin Calcium)  Anticoagulation Management History:      The patient is taking warfarin and comes in today for a routine follow up visit.  Negative risk factors for bleeding include an age less than 65 years old.  The bleeding index is 'low risk'.  Positive CHADS2 values include History of HTN.  Negative CHADS2 values include Age > 65 years old.  The start date was 09/12/2004.  His last INR was 5.6 ratio.  Anticoagulation responsible provider: Ladona Ridgel MD, Sharlot Gowda.  INR POC: 3.3.  Cuvette Lot#: [id number].  Exp: 08/2011.    Anticoagulation Management Assessment/Plan:      The patient's current anticoagulation dose is Warfarin sodium 5 mg tabs: Use as directed by Anticoagulation Clinic.  The target INR is 2.5-3.5.  The next INR is due 08/24/2010.  Anticoagulation instructions were given to patient.  Results were reviewed/authorized by Bethena Midget, RN, BSN.  He was notified by Bethena Midget, RN, BSN.         Prior Anticoagulation Instructions: INR 5.0  Skip 2 doses of Coumadin, then start taking 1.5 tablets daily except 1 tablet on Mondays and Fridays.  Recheck in 10 days-2 weeks.   Current Anticoagulation Instructions: INR 3.3 Continue 1.5 pills everyday except 1 pill on Mondays and Fridays. Recheck in 2 weeks.

## 2010-08-31 NOTE — Assessment & Plan Note (Signed)
Summary: f37m   Visit Type:  Follow-up Primary [name]:  [name]  CC:  no complaints.  History of Present Illness: Overall doing pretty well.  Able to do most things at present without too much difficulty.  He gives out of breath a little more easily.  He will rest when he walks, and then can go farther, but exercise tolerance seems a bit less.  He does not think shortness of breath is new.  It has been going on for a long time.   Last CXR in April 2010  Comparison: 09/10/2007 and earlier.    Findings: Stable chronic volume loss with pleural scarring and/or   posterior pleural effusion in the left hemithorax.  Stable sequelae   of median sternotomy and probable mitral valve replacement.  Stable   cardiac size and mediastinal contours.  No pneumothorax, pulmonary   edema, right pleural effusion or acute airspace opacity.   Multilevel compression deformities in the thoracic and upper lumbar   spine re-identified. Chronic left lateral rib deformities may be   postoperative. Stable visualized osseous structures.  Visualized   tracheal air column is within normal limits.    IMPRESSION:   Stable postoperative other chronic changes of the chest as detailed   above. No acute cardiopulmonary abnormality identified.    Read By:  Augusto Gamble,  M.D.  Current Medications (verified): 1)  Flovent Diskus 100 Mcg/blist Aepb (Fluticasone Propionate (Inhal)) .... 4 Times A Day 2)  Atrovent Hfa 17 Mcg/act Aers (Ipratropium Bromide Hfa) .... 4 Times A Day 3)  Furosemide 20 Mg Tabs (Furosemide) .... Take One Tablet By Mouth Daily. 4)  Zetia 10 Mg Tabs (Ezetimibe) .... Take One Tablet By Mouth Daily. 5)  Altace 5 Mg Tabs (Ramipril) .... Take 1 Tablet By Mouth Once A Day 6)  Klor-Con M20 20 Meq Cr-Tabs (Potassium Chloride Crys Cr) .... Take 1 Tablet By Mouth Once A Day 7)  Metoprolol Tartrate 25 Mg Tabs (Metoprolol Tartrate) .... Take One Tablet By Mouth Twice A Day 8)  Zolpidem Tartrate 5 Mg Tabs  (Zolpidem Tartrate) .... As Needed 9)  Cyclobenzaprine Hcl 5 Mg Tabs (Cyclobenzaprine Hcl) .... As Needed 10)  Ferrous Sulfate 325 (65 Fe) Mg Tabs (Ferrous Sulfate) .... Take 1 Tablet By Mouth Once A Day 11)  Omeprazole 20 Mg Tbec (Omeprazole) .... Take 1 Tablet By Mouth Once A Day 12)  Warfarin Sodium 5 Mg Tabs (Warfarin Sodium) .... Use As Directed By Anticoagulation Clinic 13)  Hydrocodone-Acetaminophen 5-500 Mg Tabs (Hydrocodone-Acetaminophen) .... As Needed 14)  Vitamin D3 1000 Unit Caps (Cholecalciferol) .... Take 1 Capsule By Mouth Once A Day 15)  Allopurinol 300 Mg Tabs (Allopurinol) .... Take 1 Tablet By Mouth Twice Daily  Allergies (verified): 1)  ! Crestor (Rosuvastatin Calcium)  Vital Signs:  Patient profile:   65 year old male Height:      69 inches Weight:      183 pounds BMI:     27.12 Pulse rate:   79 / minute BP sitting:   142 / 74  (left arm) Cuff size:   regular  Vitals Entered By: Hardin Negus, RMA (May 15, 2010 3:52 PM)  Physical Exam  General:  Well developed, well nourished, in no acute distress. Head:  normocephalic and atraumatic Eyes:  PERRLA/EOM intact; conjunctiva and lids normal. Lungs:  Decrease BS on left lower lung.  No rales noted.  Otherwise the lungs are clear. Heart:  Irregularly irregular rhythm.  No murmur at present.  Abdomen:  Bowel sounds positive; abdomen soft and non-tender without masses, organomegaly, or hernias noted. No hepatosplenomegaly. Extremities:  No clubbing or cyanosis. Neurologic:  Alert and oriented x 3.   EKG  Procedure date:  05/15/2010  Findings:      atrial fibrillation with controlled ventricular response.  Nonspecific T flattening.  Impression & Recommendations:  Problem # 1:  ATRIAL FIBRILLATION (ICD-427.31)  rate seems well controlled on current regimen.  Shortness of breath seems most likely pulmonary related.  His updated medication list for this problem includes:    Metoprolol Tartrate 25 Mg  Tabs (Metoprolol tartrate) .Marland Kitchen... Take one tablet by mouth twice a day    Warfarin Sodium 5 Mg Tabs (Warfarin sodium) ..... Use as directed by anticoagulation clinic  Orders: EKG w/ Interpretation (93000)  Problem # 2:  ANEMIA, IRON DEFICIENCY (ICD-280.9) asked him to have labs sent from Dr. 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 His updated medication list for this problem includes:    Warfarin Sodium 5 Mg Tabs (Warfarin sodium) ..... 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  No murmur at present.   Patient Instructions: 1)  Your physician wants you to follow-up in: 6 MONTHS.   You will receive a reminder letter in the mail two months in advance. If you don't receive a letter, please call our office to schedule the follow-up appointment. 2)  Your physician recommends that you continue on your current medications as directed. Please refer to the Current Medication list given to you today.

## 2010-08-31 NOTE — Medication Information (Signed)
Summary: rov/nb  Anticoagulant Therapy  Managed by: Bethena Midget, RN, BSN Referring MD: Shawnie Pons MD PCP: [name] Supervising MD: Gala Romney MD, Reuel Boom Indication 1: Atrial Fibrillation (ICD-427.31) Indication 2: Mitral Valve Replacement (ICD-V43.3) Lab Used: [facility] Site: [facility] INR POC 4.5 INR RANGE 2.5 - 3.5  Dietary changes: no    Health status changes: no    Bleeding/hemorrhagic complications: no    Recent/future hospitalizations: no    Any changes in medication regimen? no    Recent/future dental: no  Any missed doses?: no       Is patient compliant with meds? yes       Allergies: 1)  ! Crestor (Rosuvastatin Calcium)  Anticoagulation Management History:      The patient is taking warfarin and comes in today for a routine follow up visit.  Negative risk factors for bleeding include an age less than 86 years old.  The bleeding index is 'low risk'.  Positive CHADS2 values include History of HTN.  Negative CHADS2 values include Age > 51 years old.  The start date was 09/12/2004.  His last INR was 5.6 ratio.  Anticoagulation responsible Sandi Towe: Bensimhon MD, Reuel Boom.  INR POC: 4.5.  Cuvette Lot#: [id number].  Exp: 07/2011.    Anticoagulation Management Assessment/Plan:      The patient's current anticoagulation dose is Warfarin sodium 5 mg tabs: Use as directed by Anticoagulation Clinic.  The target INR is 2.5-3.5.  The next INR is due 07/27/2010.  Anticoagulation instructions were given to patient.  Results were reviewed/authorized by Bethena Midget, RN, BSN.  He was notified by Bethena Midget, RN, BSN.         Prior Anticoagulation Instructions: INR 3.4 Continue previous dose 1.5 tablets everyday except 1 tablet on Friday Recheck INR in 4 weeks  Current Anticoagulation Instructions: INR 4.5 Skip today's dose then resume 1.5 pill everyday except 1 pill on Fridays. Recheck in 2 weeks.

## 2010-09-12 DIAGNOSIS — I059 Rheumatic mitral valve disease, unspecified: Secondary | ICD-10-CM

## 2010-09-12 DIAGNOSIS — I4891 Unspecified atrial fibrillation: Secondary | ICD-10-CM

## 2010-09-12 DIAGNOSIS — Z952 Presence of prosthetic heart valve: Secondary | ICD-10-CM

## 2010-09-21 ENCOUNTER — Encounter: Payer: Self-pay | Admitting: Cardiology

## 2010-09-21 ENCOUNTER — Encounter (INDEPENDENT_AMBULATORY_CARE_PROVIDER_SITE_OTHER): Payer: Medicaid Other

## 2010-09-21 DIAGNOSIS — I059 Rheumatic mitral valve disease, unspecified: Secondary | ICD-10-CM

## 2010-09-21 DIAGNOSIS — I4891 Unspecified atrial fibrillation: Secondary | ICD-10-CM

## 2010-09-21 DIAGNOSIS — Z7901 Long term (current) use of anticoagulants: Secondary | ICD-10-CM

## 2010-09-21 LAB — CONVERTED CEMR LAB: POC INR: 3.2

## 2010-09-26 NOTE — Medication Information (Signed)
Summary: rov/sp  Anticoagulant Therapy  Managed by: Weston Brass, PharmD Referring MD: Shawnie Pons MD PCP: [name] Supervising MD: Myrtis Ser MD, Tinnie Gens Indication 1: Atrial Fibrillation (ICD-427.31) Indication 2: Mitral Valve Replacement (ICD-V43.3) Lab Used: [facility] Site: [facility] INR POC 3.2 INR RANGE 2.5 - 3.5  Dietary changes: no    Health status changes: no    Bleeding/hemorrhagic complications: no    Recent/future hospitalizations: no    Any changes in medication regimen? no    Recent/future dental: no  Any missed doses?: no       Is patient compliant with meds? yes       Allergies: 1)  ! Crestor (Rosuvastatin Calcium)  Anticoagulation Management History:      The patient is taking warfarin and comes in today for a routine follow up visit.  Negative risk factors for bleeding include an age less than 31 years old.  The bleeding index is 'low risk'.  Positive CHADS2 values include History of HTN.  Negative CHADS2 values include Age > 23 years old.  The start date was 09/12/2004.  His last INR was 5.6 ratio.  Anticoagulation responsible provider: Myrtis Ser MD, Tinnie Gens.  INR POC: 3.2.  Cuvette Lot#: [id number].  Exp: 07/2011.    Anticoagulation Management Assessment/Plan:      The patient's current anticoagulation dose is Warfarin sodium 5 mg tabs: Use as directed by Anticoagulation Clinic.  The target INR is 2.5-3.5.  The next INR is due 10/19/2010.  Anticoagulation instructions were given to patient.  Results were reviewed/authorized by Weston Brass, PharmD.  He was notified by Margot Chimes PharmD Candidate.         Prior Anticoagulation Instructions: INR 2.6  Coumadin 5mg  tablets - Continue 1.5 tablets every day except 1 tablet on Mondays and Fridays   Current Anticoagulation Instructions: INR 3.2  Continue to take 1 1/2 tablets daily except on Mondays and Fridays when you only take 1 tablet.  Recheck INR in 4 weeks.

## 2010-10-19 ENCOUNTER — Ambulatory Visit (INDEPENDENT_AMBULATORY_CARE_PROVIDER_SITE_OTHER): Payer: Medicaid Other | Admitting: *Deleted

## 2010-10-19 DIAGNOSIS — Z952 Presence of prosthetic heart valve: Secondary | ICD-10-CM

## 2010-10-19 DIAGNOSIS — Z7901 Long term (current) use of anticoagulants: Secondary | ICD-10-CM | POA: Insufficient documentation

## 2010-10-19 DIAGNOSIS — I059 Rheumatic mitral valve disease, unspecified: Secondary | ICD-10-CM

## 2010-10-19 DIAGNOSIS — I4891 Unspecified atrial fibrillation: Secondary | ICD-10-CM

## 2010-10-19 NOTE — Patient Instructions (Signed)
Skip today's dosage of Coumadin, then start taking 1.5 tablets daily except 1 tablet on Mondays, Wednesdays, and Fridays.  Recheck in 2-3 weeks.

## 2010-10-31 ENCOUNTER — Other Ambulatory Visit: Payer: Self-pay | Admitting: Cardiology

## 2010-11-02 ENCOUNTER — Ambulatory Visit (INDEPENDENT_AMBULATORY_CARE_PROVIDER_SITE_OTHER): Payer: Medicaid Other | Admitting: *Deleted

## 2010-11-02 DIAGNOSIS — I059 Rheumatic mitral valve disease, unspecified: Secondary | ICD-10-CM

## 2010-11-02 DIAGNOSIS — I4891 Unspecified atrial fibrillation: Secondary | ICD-10-CM

## 2010-11-02 DIAGNOSIS — Z952 Presence of prosthetic heart valve: Secondary | ICD-10-CM

## 2010-11-02 LAB — POCT INR: INR: 2.8

## 2010-11-02 NOTE — Patient Instructions (Signed)
Continue on same dosage 1.5 tablets daily except 1 tablet on Mondays, Wednesdays, and Fridays.  Recheck in 4 weeks.  

## 2010-11-29 ENCOUNTER — Encounter: Payer: Self-pay | Admitting: Cardiology

## 2010-11-30 ENCOUNTER — Ambulatory Visit (INDEPENDENT_AMBULATORY_CARE_PROVIDER_SITE_OTHER): Payer: Medicaid Other | Admitting: *Deleted

## 2010-11-30 ENCOUNTER — Encounter: Payer: Medicaid Other | Admitting: *Deleted

## 2010-11-30 ENCOUNTER — Encounter: Payer: Self-pay | Admitting: Cardiology

## 2010-11-30 ENCOUNTER — Ambulatory Visit (INDEPENDENT_AMBULATORY_CARE_PROVIDER_SITE_OTHER): Payer: Medicaid Other | Admitting: Cardiology

## 2010-11-30 ENCOUNTER — Other Ambulatory Visit: Payer: Self-pay | Admitting: Cardiology

## 2010-11-30 ENCOUNTER — Encounter (INDEPENDENT_AMBULATORY_CARE_PROVIDER_SITE_OTHER): Payer: Medicaid Other | Admitting: Cardiology

## 2010-11-30 DIAGNOSIS — I10 Essential (primary) hypertension: Secondary | ICD-10-CM

## 2010-11-30 DIAGNOSIS — I4891 Unspecified atrial fibrillation: Secondary | ICD-10-CM

## 2010-11-30 DIAGNOSIS — I059 Rheumatic mitral valve disease, unspecified: Secondary | ICD-10-CM

## 2010-11-30 DIAGNOSIS — I6529 Occlusion and stenosis of unspecified carotid artery: Secondary | ICD-10-CM

## 2010-11-30 DIAGNOSIS — Z952 Presence of prosthetic heart valve: Secondary | ICD-10-CM

## 2010-11-30 DIAGNOSIS — I050 Rheumatic mitral stenosis: Secondary | ICD-10-CM

## 2010-11-30 DIAGNOSIS — Z954 Presence of other heart-valve replacement: Secondary | ICD-10-CM

## 2010-11-30 NOTE — Assessment & Plan Note (Signed)
Rate is controlled.  Continue on current medications without change.

## 2010-11-30 NOTE — Progress Notes (Signed)
HPI:  Patient still has some shortness of breath with exertion, and this is not dramatically changed.  CXR in November showed scarring in L base without change, and similar to prior studies.  Last echo over two years ago.  No orthopnea.    Current Outpatient Prescriptions  Medication Sig Dispense Refill  . Cholecalciferol (VITAMIN D3) 1000 UNITS CAPS Take 1 capsule by mouth.        . cyclobenzaprine (FLEXERIL) 5 MG tablet Take 5 mg by mouth as needed.        . ezetimibe (ZETIA) 10 MG tablet Take 10 mg by mouth daily.        . Fluticasone Propionate, Inhal, (FLOVENT DISKUS) 100 MCG/BLIST AEPB Inhale into the lungs.        . furosemide (LASIX) 20 MG tablet TAKE 1 TABLET BY MOUTH EVERY DAY FOR FLUID  30 tablet  6  . hydrocodone-acetaminophen (LORCET-HD) 5-500 MG per capsule Take 1 capsule by mouth every 6 (six) hours as needed.        Marland Kitchen ipratropium (ATROVENT HFA) 17 MCG/ACT inhaler Inhale 2 puffs into the lungs every 6 (six) hours.        . metoprolol tartrate (LOPRESSOR) 25 MG tablet Take 25 mg by mouth 2 (two) times daily.        Marland Kitchen omeprazole (PRILOSEC) 20 MG capsule Take 20 mg by mouth daily.        . ramipril (ALTACE) 5 MG capsule Take 5 mg by mouth daily.        Marland Kitchen warfarin (COUMADIN) 5 MG tablet USE AS DIRECTED BY ANTICOAGULATION CLINIC  50 tablet  2  . zolpidem (AMBIEN) 5 MG tablet Take 5 mg by mouth at bedtime as needed.        . potassium chloride SA (K-DUR,KLOR-CON) 20 MEQ tablet Take 20 mEq by mouth daily.        Marland Kitchen DISCONTD: allopurinol (ZYLOPRIM) 300 MG tablet Take 300 mg by mouth 2 (two) times daily.        Marland Kitchen DISCONTD: ferrous gluconate (FERGON) 325 MG tablet Take 325 mg by mouth daily.          Allergies  Allergen Reactions  . Rosuvastatin     Past Medical History  Diagnosis Date  . Iron deficiency anemia, unspecified   . Other diseases of lung, not elsewhere classified   . Other primary cardiomyopathies   . Personal history of other diseases of digestive system   . Personal  history of surgery to heart and great vessels, presenting hazards to health   . Acute and subacute bacterial endocarditis   . Arrhythmia   . Cerebrovascular disease, unspecified   . Esophageal reflux   . Generalized osteoarthrosis, unspecified site   . Hypertension   . Chronic airway obstruction, not elsewhere classified     Past Surgical History  Procedure Date  . 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 Video assisted thoracoscopy     left-and miniature muscle sparing thoracotomy for left lower lobectomy 04/29/2007  dr. Cornelius Moras    No family history on file.  History   Social History  . Marital Status: Divorced    Spouse Name: N/A    Number of Children: N/A  . Years of Education: N/A   Occupational History  . Not on file.   Social History Main Topics  . Smoking status: Former Games developer  . Smokeless tobacco: Not on file  . Alcohol  Use: Yes  . Drug Use:   . Sexually Active:    Other Topics Concern  . Not on file   Social History Narrative  . No narrative on file    ROS: Please see the HPI.  All other systems reviewed and negative.  PHYSICAL EXAM:  BP 122/78  Pulse 80  Resp 18  Ht 5\' 10"  (1.778 m)  Wt 178 lb (80.74 kg)  BMI 25.54 kg/m2  General: Well developed, well nourished, in no acute distress. Head:  Normocephalic and atraumatic. Neck: no JVD Lungs: Clear to auscultation and percussion. Heart: Normal S1 and S2.  No murmur, rubs or gallops.  Abdomen:  Normal bowel sounds; soft; non tender; no organomegaly Pulses: Pulses normal in all 4 extremities. Extremities: No clubbing or cyanosis. No edema. Neurologic: Alert and oriented x 3.  EKG:  Atrial fibrillation with controlled ventricular response.   ASSESSMENT AND PLAN:

## 2010-11-30 NOTE — Assessment & Plan Note (Signed)
Studies pending at the present time.

## 2010-11-30 NOTE — Assessment & Plan Note (Signed)
Currently controlled at present.  

## 2010-11-30 NOTE — Assessment & Plan Note (Signed)
Had prior tissue valve which looked good in 2010.  However, will recheck echo at this point given history of tissue valves in the mitral position.  Exam not revealing.

## 2010-11-30 NOTE — Patient Instructions (Signed)
Your physician has requested that you have an echocardiogram. Echocardiography is a painless test that uses sound waves to create images of your heart. It provides your doctor with information about the size and shape of your heart and how well your heart's chambers and valves are working. This procedure takes approximately one hour. There are no restrictions for this procedure.  Your physician recommends that you schedule a follow-up appointment in: 3 MONTHS  Your physician recommends that you continue on your current medications as directed. Please refer to the Current Medication list given to you today.   

## 2010-12-04 ENCOUNTER — Encounter: Payer: Self-pay | Admitting: Cardiology

## 2010-12-12 ENCOUNTER — Telehealth: Payer: Self-pay

## 2010-12-12 NOTE — Assessment & Plan Note (Signed)
[facility]                            CARDIOLOGY OFFICE NOTE   Jeffery Copeland, Jeffery Copeland                        MRN:          [id number]  DATE:07/08/2007                            DOB:          [birth year]    Jeffery Copeland is in for a followup visit.  Jeffery Copeland underwent left lower lobe  lobectomy and had some difficulty afterwards.  However, he is slowly and  gradually improving.  He denies any ongoing symptoms at the present  time.  He has been on some iron replacement postoperatively, and overall  he is generally improved.   MEDICATIONS:  1. Aspirin 81 mg daily.  2. Flovent inhaler 4 times daily.  3. Atrovent inhaler 4 times daily.  4. Furosemide 20 mg daily.  5. Zetia 10 mg daily.  6. Celebrex 200 mg daily.  7. Altace 5 mg daily.  8. Omeprazole 20 mg daily.  9. Metoprolol 25 mg daily.  10.Klor-Con 20 mEq daily.  11.Ferrous sulfate 325 mg a day.   PHYSICAL EXAMINATION:  He is alert and oriented, the weight is 176  pounds, the blood pressure 113/71 and the pulse is 76.  The lung fields are really quite clear.  There is some decreased breath  sounds at the lower portion of the left base.  The abdomen is soft and nontender and there is no extremity edema.   IMPRESSION:  1. Status post left lower lobe lobectomy with postoperative anemia.  2. Mild chronic obstructive pulmonary disease.  3. Status post mitral valve replacement.   PLAN:  We will get a CBC today as well as basic metabolic profile.  I  will have him return to the clinic in 2 to 3 months in followup.     Jeffery Copeland. Riley Kill, MD, [facility]  Electronically Signed    TDS/MedQ  DD: 07/08/2007  DT: 07/09/2007  Job #: [id number]

## 2010-12-12 NOTE — Assessment & Plan Note (Signed)
[facility]                            CARDIOLOGY OFFICE NOTE   ISSAI, WERLING                        MRN:          [id number]  DATE:11/29/2006                            DOB:          Dec 21, 1945    Mr. Kray is in for a followup visit.  To briefly summarize, he has been  stable.  He does have some modest shortness of breath with exertion, but  he does have significant underlying COPD.  We have been trying to wean  him off amiodarone.  He was placed on this 2 years ago for atrial  fibrillation with rapid ventricular response, and we elected to cut this  down.  We talked about the possibility of discontinuing it.  He also has  carotid disease, and followup carotid studies are scheduled for July or  August of this year.  He denies any substantial change in overall  status.   EXAMINATION:  The weight is 176.  The blood pressure 128/66, and the  pulse is 72.  It is regular.  LUNGS:  Generally clear without significant rhonchi or rales.  There is  delay in expiration compatible with his known COPD.  HEART:  Rhythm is regular, and is unchanged.  He has trace lower extremity edema.   His EKG reveals sinus rhythm, and is essentially within normal limits.   IMPRESSION:  1. Status post mitral valve replacement for endocarditis.  2. Atrial fibrillation with rapid ventricular response, eventually      resolved, now on amiodarone.  3. Significant chronic obstructive pulmonary disease.  4. 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   PLAN:  1. Return to clinic in 2 months.  2. At that time, carotid Dopplers, repeat EKG, and chest x-ray will be      obtained.  3. He will go ahead and stop amiodarone at the present time, given the      underlying lung disease.  I have carefully reminded him that he      could develop recurrent symptoms.  4. He will continue his cholesterol medication.   ADDENDUM  1. EKG today reveals normal rhythm,  and was within normal limits.  QT      interval of 431 msec.  2. Current medications include:      a.     Aspirin 81 mg daily.      b.     Altace 5 mg daily.      c.     Flovent 2 puffs b.i.d.      d.     Atrovent 2 puffs b.i.d.      e.     Furosemide 20 mg daily.      f.     K-Dur 20 mEq b.i.d.      g.     Zetia 10 mg daily.      h.     Celebrex 200 mg daily.      i.     Amiodarone 100 mg daily.      j.     Omeprazole 20 mg daily.  [name], MD, [facility]  Electronically Signed    TDS/MedQ  DD: 11/29/2006  DT: 11/29/2006  Job #: [id number]   cc:   Merlene Laughter. Renae Gloss, M.D.

## 2010-12-12 NOTE — Op Note (Signed)
NAMEDJANGO, NGUYEN                 ACCOUNT NO.:  [id number]   MEDICAL RECORD NO.:  [id number]          PATIENT TYPE:  AMB   LOCATION:  ENDO                         FACILITY:  [facility]   PHYSICIAN:  Anselmo Rod, M.D.  DATE OF BIRTH:  [birth year]   DATE OF PROCEDURE:  04/14/2007  DATE OF DISCHARGE:                               OPERATIVE REPORT   PROCEDURE PERFORMED:  Colonoscopy with core biopsies x 2.   ENDOSCOPIST:  Anselmo Rod, M.D.   INSTRUMENT USED:  Pentax video colonoscope.   INDICATIONS FOR PROCEDURE:  This is a 65 year old white male with a  history of right lower quadrant pain and an abnormal CT showing  inflammatory changes in the right colon and currently undergoing  colonoscopy prior to a pulmonary evaluation for a lung nodule; rule out  colitis.   PREPROCEDURE PREPARATION:  Informed consent was procured from the  patient. The patient fasted for 8 hours prior to the procedure. He was  prepped with a bottle of Magnesium citrate and a gallon of NuLytely the  night prior to the procedure. The risks and benefits of the procedure  including a 10% missed rate of cancer and polyp were discussed with the  patient as well. The patient has a prosthetic valve and therefore has  been on Coumadin.  He stopped the Coumadin three weeks ago on his own;  however, on Friday I had a discussion with the pharmacist at the  Coumadin Clinic [at the [place] office] and she advised that the patient  can stay of the Coumadin for another couple of days.  The patient was  given a gram of Ancef for prophylaxis prior to the procedure.   PREPROCEDURE PHYSICAL:  The patient has stable vital signs. Neck:  Supple. Chest: There is a midline scar from previous valve replacement.  S1 and S2 regular. Occasional rhonchi. Abdomen: Soft, nontender, with  normal bowel sounds.   DESCRIPTION OF PROCEDURE:  The patient was placed in the left lateral  decubitus position and sedated with 50 mcg of Fentanyl  and 4 mg Versed  given intravenously in slow incremental doses.  Once the patient was  adequately sedated and maintained on low-flow oxygen and continuous  cardiac monitoring, the Pentax video colonoscope was advanced from the  rectum to the cecum.  The appendiceal orifice and the ileocecal valve  were visualized and photographed after multiple washes. A small ulcer  was biopsied from the cecum (cold biopsy times two).  A few sigmoid  diverticula were present with small internal hemorrhoids were seen on  retroflexion.  No masses or polyps were identified.  The patient  tolerated the procedure well, without immediate complications.   IMPRESSION:  1. Small nonbleeding internal hemorrhoids.  2. Few sigmoid diverticula.  3. Normal-appearing transverse colon.  4. Small ulcer noted in the cecum; ?ischemic colitis-biopsies done.   RECOMMENDATIONS:  1. Await pathology results.  2. Repeat colonoscopy in the next ten years unless the patient has any      abnormal GI symptoms prior to that or in the interim.  3. Resume  Coumadin  use today.  4. Proceed with a pulmonary evaluation under the guidance of Dr.      Karle Plumber, for a lung nodule as discussed with Dr. Edwyna Shell in      the recent past.  5. Outpatient follow-up as the need arises in the future.      Anselmo Rod, M.D.  Electronically Signed     JNM/MEDQ  D:  04/14/2007  T:  04/14/2007  Job:  [id number]   cc:   Merlene Laughter. Renae Gloss, M.D.  Fax: [phone]   Ines Bloomer, M.D.  [address]  [place]  [place] [address]

## 2010-12-12 NOTE — Assessment & Plan Note (Signed)
[facility]                            CARDIOLOGY OFFICE NOTE   RONAL, MAYBURY                        MRN:          [id number]  DATE:06/05/2007                            DOB:          Nov 06, 1945    Mr. Grell is in for a followup visit.  He saw Dr. Cornelius Moras on Monday and he  is going to be followed again in 2 months.  He has a resolving left  effusion.  Mr. Pollard continues to feel tired.  Since we noted that his  hemoglobin was low, the patient did not get iron, but he has been  getting broccoli.  I had him stop his Coumadin a couple of days ago, but  I told him that I thought we could go ahead and restart this over the  weekend.   PHYSICAL EXAMINATION:  On exam, the blood pressure is 110/76, the pulse  is 72.  The lung fields reveal decreased breath sounds in the left base.   PLAN:  I will decrease his Klor-Con to 1 tab daily.  We will start  ferrous sulfate 325 mg b.i.d. for maybe 2 weeks.  I am going to go ahead  and recheck his CBC and BMET today to make sure that these are staying  within reasonable parameters.     [name], MD, [facility]  Electronically Signed    TDS/MedQ  DD: 06/05/2007  DT: 06/05/2007  Job #: [id number]   cc:   Merlene Laughter. Renae Gloss, M.D.  Salvatore Decent. Cornelius Moras, M.D.

## 2010-12-12 NOTE — Op Note (Signed)
Jeffery Copeland, Jeffery Copeland                 ACCOUNT NO.:  [id number]   MEDICAL RECORD NO.:  [id number]          PATIENT TYPE:  INP   LOCATION:  2550                         FACILITY:  [facility]   PHYSICIAN:  Salvatore Decent. Cornelius Moras, M.D. DATE OF BIRTH:  1945/08/04   DATE OF PROCEDURE:  04/29/2007  DATE OF DISCHARGE:                               OPERATIVE REPORT   PREOPERATIVE DIAGNOSIS:  Left lung nodule.   POSTOPERATIVE DIAGNOSIS:  Left lung nodule.   PROCEDURE:  1. Flexible bronchoscopy.  2. Left video-assisted thoracoscopy and miniature muscle sparing      thoracotomy for left lower lobectomy.   SURGEON:  Dr. Purcell Nails.   ASSISTANT:  Mr. Lenise Herald.   ANESTHESIA:  General.   BRIEF CLINICAL NOTE:  The patient is a 64 year old white male with  history of chronic obstructive pulmonary disease and remote history of  tobacco abuse who recently was found to have a suspicious lung nodule  appreciated in the left lower lobe initially on the abdominal CT scan  that was ordered for evaluation of abdominal pain.  PET CT scan was  performed confirming the presence of a suspicious spiculated small  lesion deep within the parenchyma of the left lower lobe that was  slightly hypermetabolic on PET scan.  There is no other pulmonary  parenchymal lesions.  There are no pleural effusions.  There is no  mediastinal nor hilar lymphadenopathy.  The patient has been counseled  regarding a variety of treatment alternatives including close  observation versus proceeding with surgery for direct surgical removal  of the lesion in question to rule out the possibility of underlying lung  cancer.  The radiographic appearance of this lesion is felt to be highly  suspicious, although certainly not definitively diagnostic.  The lesion  is too small to consider needle aspiration biopsy.  After considerable  discussion, the patient desires to proceed with surgery for definitive  removal of the lesion in question to  establish definitive diagnosis and  treat as needed.  The patient understands and accepts all potential  associated risks of surgery and desires to proceed as described.   OPERATIVE NOTE IN DETAIL:  The patient was brought to the operating room  on the above-mentioned date and placed in supine position on the  operating table.  Intravenous antibiotics were administered.  A radial  arterial line and central venous catheter are placed by the anesthesia  team under the care and direction Dr. Sharee Holster.  Pneumatic  sequential compression boots were placed on both lower extremities.  General endotracheal anesthesia is induced uneventfully.  Initially a  single-lumen endotracheal tube is placed.   Flexible bronchoscopy is performed through the patient's endotracheal  tube.  The distal trachea, carina, and left and right endobronchial  trees are visualized.  There is normal endobronchial anatomy.  There are  no endobronchial lesions.  No other abnormalities are appreciated.  The  bronchoscope was removed uneventfully.   The patient is reintubated using a dual lumen endotracheal tube.  The  patient is turned to the right lateral decubitus position using an  axillary roll and pneumatic beanbag device to facilitate positioning.  Single lung ventilation is begun.  The patient's left chest is prepared,  draped in a sterile manner.   A small incision is made overlying the anterior axillary line and  approximately the seventh intercostal space.  The incision is completed  through the subcutaneous tissues and his intercostal musculature with  electrocautery.  The left pleural space was entered bluntly.  A 10 mm  port was passed through the incision and a 30 degrees endoscopic camera  is passed through the port.  The left chest was explored visually.  There are no adhesions.  There are changes grossly on the surface of the  lung consistent with chronic obstructive pulmonary disease.  There is  no  pleural effusion.  No gross abnormalities are identified.   An additional incision is made straight laterally through the fifth  intercostal space.  Using this incision under video thoracoscopic  guidance, the left lower lobe was palpated using that a single digit.  No nodular opacity can be appreciated using that this technique.  Subsequently, the small incision is converted to a muscle sparing  lateral thoracotomy incision.  The latissimus dorsi muscle fibers were  split longitudinally.  The serratus anterior muscle is retracted  anteriorly.  A miniature rib spreader is placed to facilitate further  exposure of the left lower lobe and palpation for the lesion in  question.  Again, the discrete lesion noted on previous CT scan and PET  scan cannot be obviously identified.  The incision is enlarged to allow  the surgeon's hand completely within the left pleural space.  The  inferior pulmonary ligament is divided to facilitate mobilization of the  left lower lobe.  The lung was carefully palpated.  There are no lesions  at all in the left upper lobe.  In the left lower lobe there feels to be  nodular density in the peribronchial region deep in the middle  parenchyma of the left lower lobe.  No other discrete nodular  abnormalities are appreciated.  This density is deep within the  parenchyma and felt to be not amenable to wedge resection for diagnosis  due to its anatomical location.  Under the circumstances lobectomy is  felt to be indicated to definitively rule out pathologic lesion as  identified on preoperative radiographic studies.  The alternative would  consist of closing the thoracotomy and continued close observation.  Under the circumstances lobectomy is felt to be most prudent course of  action.   The reflection of the pleura is divided around the hilum of the left  lung.  The inferior pulmonary vein is encircled and divided with ATW  stapling device.  The major fissure  is completed with several fires of  the Echelon stapling device.  The floor of the fissure was opened to  expose the anterior surface of pulmonary artery.  The branches of  pulmonary artery to the lingular segment of the left upper lobe were  carefully identified and preserved.  The superior segment branch of the  lower lobe pulmonary artery is separately encircled and divided with ATW  stapling device.  The remainder of the pulmonary artery branch to the  left lower lobe was divided with ATW stapling device.  The left lower  lobe bronchus is then divided using an echelon stapling device.  The  stapler was initially applied and the left upper lobe inflated to make  sure that the lingular segment inflates normally which it does.  The  left lower lobe bronchus is transected and the left lower lobe is  removed and sent to pathology.  An interlobar lymph node is identified  and sent as a separate specimen labeled 11L lymph node.  A single benign-  appearing lymph node in the AP window was biopsied and labeled level 5  lymph node.  Meticulous hemostasis is ascertained.   Preliminary frozen section histology is notable for the absence of any  discrete nodule which is suspicious.  There are several peribronchial  lymph nodes that appear benign on initial inspection.  The pathology  specimen is inspected directly with the pathologist in the pathology  department after sectioning of the specimen, and plan is made for  fixation of the entire left lower lobe and serial sectioning to make  sure that a suspicious lesion is not encountered.  The only lesions in  question appear to be benign-appearing inflammatory lymph nodes in the  peribronchial region at setting of completion of the operation.   The left chest is irrigated with warm saline solution.  The lung was  inflated to make sure no significant air leak remains.  The On-Q  continuous pain management system is utilized to facilitate   postoperative pain control.  One 5-inch catheter supplied with the On-Q  kit is tunneled initially through the subcutaneous tissues and through  the intercostal space to be positioned in the subpleural space  longitudinally in the gutter along the nerve roots.  The catheter is  flushed with 5 mL of 0.5% bupivacaine solution and ultimately connected  to continuous infusion pump.  The thoracotomy is closed in multiple  layers in routine fashion after placement of two 28-French chest tubes  to drain  the left pleural space.  The skin is closed with subcuticular skin  closure.  The patient tolerated procedure well, was extubated in the  operating room, and transported to recovery room in stable condition.  There are no intraoperative complications.      Salvatore Decent. Cornelius Moras, M.D.  Electronically Signed     CHO/MEDQ  D:  04/29/2007  T:  04/29/2007  Job:  [id number]   cc:   Larina Earthly, M.D.  [name], MD, [facility]  Anselmo Rod, M.D.

## 2010-12-12 NOTE — Assessment & Plan Note (Signed)
[facility]                            CARDIOLOGY OFFICE NOTE   JAYLAN, HINOJOSA                        MRN:          [id number]  DATE:10/20/2008                            DOB:          20-Jul-1946    [name] is in for a followup visit.  In general, he feels okay.  He had  his carotids done today.  We do not have the final report on that.  An  echocardiogram was done and does demonstrate reasonably preserved LV  function.  Importantly, his mitral valve prosthesis appears to be  functioning reasonably well.  His overall LV function is not depressed.  He says that he does noticed something a little bit different, in fact,  we noted him to be in atrial flutter today with 4:1 AV conduction.  He  remains otherwise stable.  He is not increasingly short of breath.   MEDICATIONS:  1. Vitamin D3 1000 units daily.  2. Flovent inhaler 4 times daily.  3. Atrovent inhaler 4 times daily.  4. Furosemide 20 mg daily.  5. Zetia 10 mg daily.  6. Altace 5 mg daily.  7. Omeprazole 20 mg daily.  8. Klor-Con 20 mEq daily.  9. Ferrous sulfate 325 b.i.d.  10.Coumadin as directed.  11.Metoprolol tartrate 25 mg p.o. b.i.d.   His INR today was 1.3.   On physical, he is alert and oriented.  Blood pressure is 120/80, the  pulse is 65.  The lung fields are clear.  There is no definite change in  his overall exam and I did not hear a significant murmur.   The electrocardiogram demonstrates what appears to be a left atrial  flutter.  I have reviewed the tracing in detail with Dr. Lewayne Bunting.  His ventricular rate is controlled and is regular and appears to be a  4:1 conduction pattern.  The suggestion is that it probably represents a  left atrial morphology.   IMPRESSION:  1. Status post mitral valve replacement with mitral bioprosthesis.  2. Status post left lower lobe lobectomy.  3. Onset of atrial flutter, probably left atrium with controlled      ventricular  response.  4. Subtherapeutic INR.  5. Mild chronic obstructive pulmonary disease.   PLAN:  1. Return to clinic in 3 weeks.  2. Continue Coumadin Clinic followup closely to get INR above 2.  3. I spoke with Dr. Ladona Ridgel.  He would recommend that we consider a      cardioversion his initial therapy for this, specifically in part      related to the fact that the patient's findings are left atrial      morphology, and it would be less easy to do the procedure.     [name], MD, [facility]  Electronically Signed    TDS/MedQ  DD: 10/20/2008  DT: 10/21/2008  Job #: [id number]

## 2010-12-12 NOTE — Letter (Signed)
September 10, 2007    Merlene Laughter. Renae Gloss, M.D.  [address]  [address]  [place], [place] [address]   RE:  Jeffery Copeland, Jeffery Copeland  MRN:  [id number]  /  DOB:  12-Mar-1946   Dear Selena Batten:   I had the pleasure of seeing Branton Einstein in the office today in a  followup visit.  We have been following him since he underwent surgery  by Dr. Ashley Mariner.  He has continued to have some lung discomfort and  still has some shortness of breath.  He has not had any edema.  He did  become anemic after the operative procedure, and we have placed him back  on iron.  Despite this, his hemoglobin has not been coming up in a  normal fashion.  He also has seen Dr. Anselmo Rod in the past and  had some sort of colonic ulcer.  Cardiac-wise, he seems to have done  reasonably well.   PHYSICAL EXAMINATION:  VITAL SIGNS:  Today on examination, the blood  pressure was 121/71. The pulse was 81.  LUNGS:  The lung fields were clear, but there was some decrease in  breath sounds at the left base.  HEART:  The cardiac rhythm was regular.  ABDOMEN:  The abdomen was soft.  RECTAL:  A rectal exam was done and, despite being on iron, was  Hemoccult negative.   Recent carotid studies have revealed a 60-79% right internal carotid  artery stenosis and a 40-59% left internal carotid stenosis.   Recent hemoglobin was 10.5, having been 10.9, and the patient has been  on replacement iron.   In summary, this gentleman has been stable.  However, his hemoglobin has  not come up. We are going to get iron studies.  We have made an  appointment for him to follow up with Jyothi.  He is also scheduled to  see you back in followup in a couple of weeks.  I will turn this over to  the  two of you at this time, with his continued mild anemia.  We will see  him back in followup in about 6 or 8 weeks.  Otherwise, he will remain  on the same medical regimen.   I appreciate the opportunity of sharing in his care.  Best regards.     Sincerely,      [name], MD, [facility]  Electronically Signed    TDS/MedQ  DD: 09/10/2007  DT: 09/12/2007  Job #: [id number]

## 2010-12-12 NOTE — Discharge Summary (Signed)
Jeffery Copeland, Jeffery Copeland                 ACCOUNT NO.:  [id number]   MEDICAL RECORD NO.:  [id number]          PATIENT TYPE:  INP   LOCATION:  4705                         FACILITY:  [facility]   PHYSICIAN:  Hind I Elsaid, MD      DATE OF BIRTH:  [birth year]   DATE OF ADMISSION:  03/02/2007  DATE OF DISCHARGE:  03/06/2007                               DISCHARGE SUMMARY   DISCHARGE DIAGNOSIS:  1. Right-side colitis.  2. Abnormal cecum, infectious versus malignancy.  Patient needs to      have colonoscopy as an outpatient with Dr. Loreta Ave.  3. A 1.13-cm spiculated mass at the left lower lobe, worrisome for      bronchogenic carcinoma.  4. Mitral valve replacement with porcine valve in 1999.  Patient on      chronic Coumadin.  5. Chronic atrial fibrillation.  6. Hypertension.  7. Degenerative joint disease.  8. Chronic obstructive pulmonary disease.  9. Gastroesophageal reflux disease, on proton pump inhibitors.  10.Cerebrovascular disease with bilateral carotid abnormalities.  11.Remote history of multiple compression fractures.  12.Anemia with hemoglobin on discharge around 9.4.   DISCHARGE MEDICATION:  1. Flagyl 500 mg p.o. 3 times a day for 10 days.  2. Cipro 500 mg p.o. q.12 hours for 10 days.  3. Zetia 10 mg p.o. daily.  4. Protonix 40 mg p.o. daily.  5. Atrovent inhaler 2 puffs b.i.d.  6. Lasix 20 mg p.o. daily.  7. Potassium chloride 20 mEq p.o. b.i.d.  8. Aspirin 81 mg p.o. daily.  9. Flovent 110 mcg 2 puffs b.i.d.  10.Coumadin 5 mg p.o. daily.  INR to recheck on Coumadin clinic in the      morning.   CONSULTATIONS:  1. Gastrointestinal was consulted for abdominal pain.  2. Cardiology was consulted for anticoagulation.  3. Pulmonary consult done by Dr. Vassie Loll for evaluation of the pulmonary      nodules.   PROCEDURES:  1. Abdominal x-ray done on March 02, 2007.  Compression fracture of      L2, age undetermined.  Otherwise benign abdomen.  2. CT chest without contrast:  A 1.13-cm  spiculated left lower lobe      lesion, highly suggestive of malignancy.  No evidence of pleural      effusion or adenopathy.  Emphysema, mild cardiomegaly, and cardiac      valve replacement.  3. CT abdomen and pelvis:  A 13.5-mm spiculated mass in the left lower      lobe, worrisome for carcinoma of the lung.  A 16.5-mm mass in the      dome of the right lobe of the liver, most likely hemangioma.  This      needs to be followed up, given the probable carcinoma in the left      lower lobe of the lobe of the lung.  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  Irregular      thickening of the wall of the cecum.  There is some mild soft-      tissue  stranding in the adjacent fat.  The appendix is normal as is      the terminal ileum.  Diagnosis of tumor versus fat should be      considered normal.  Note is made that the patient does have mild      compression fracture.  It seems like an old fracture.  4. Beta scan done on March 06, 2007.  Mild uptake associated with the      left lower lobe fasciculated mass lesion of this size, particularly      at the lung base, estimated metabolic activity may remain worrisome      for bronchogenic cancer.  No evidence of hypermetabolic mediastinal      lymph node or adenopathy, mild diffuse uptake in the thyroid,      suggests chronic thyroiditis.  Metabolic activity associated with      the cecum, while this is typical location for increased metabolic      activity the associated stranding suggests colitis, cannot rule out      neoplasm, although no mass identified.  Consider colonoscopy.   HISTORY OF PRESENT ILLNESS:  1. You can review the history of the present illness done by Dr.      Della Goo.  Patient admitted for right abdominal pain.  CT      scan as above.  Patient was started on clear liquid diet.  The      differential diagnosis was ischemic versus infectious versus      malignancy.  The patient  had colonoscopy which was normal  in 2005.      Patient was restarted on Cipro and Flagyl.  GI consult done by Dr.      Loreta Ave who recommended conservative management and follow with her as      an outpatient for possibility of colonoscopy and further      evaluation.  During hospitalization, abdominal pain significantly      improved with antibiotics.  We will discharge the patient on p.o.      antibiotics for 10 days.  Further followup to be done by Dr. Loreta Ave      for follow up colonoscopy if deemed necessary.  2. Spiculated lung nodule.  Pulmonary was consulted for further      evaluation where beta scan was ordered.  The results as above.      Possibility of bronchogenic carcinoma.  Mr. Sautter will follow with      Dr. Vassie Loll for possible bronchoscopy, possible thoracic surgery and      PFT to be done as an outpatient.  Patient is already scheduled on      March 13, 2007 at 2 p.m., to follow with Dr. Vassie Loll for pulmonary      function test, and to be further evaluated by thoracic surgery for      possible lobectomy.  3. Mitral valve replacement with porcine valve.  Patient on chronic      anticoagulation.  Patient was on admission with INR of 5.  Will      decrease the Coumadin to 5 mg p.o. daily.  Patient already has      arranged appointment for Coumadin clinic in the a.m. for further      evaluation followup for his INR.  We suspect that patient is going      to have difficulty with Coumadin. Followup Coumadin dose in  10      days secondary to the interaction with the Cipro and Flagyl.  We      will hold it today,  INR is 3.5;  will hold Coumadin today.      Patient was informed, and to start it tomorrow with Coumadin of 5.      Further followup to be arranged at Coumadin clinic.  4. Chronic obstructive pulmonary disease, was stable during      hospitalization.   We feel that the patient is medically stable to be followed up as an  outpatient.  Patient has an appointment with Dr. Vassie Loll  on March 13, 2007, for the evaluation of pulmonary nodule, and for possibility of PFT  and possibility of lobectomy/biopsy.  Patient also has to follow up with  Dr. Loreta Ave as an outpatient for possibility of colonoscopy and evaluation  of abnormal irregularity of the cecum.      Hind Bosie Helper, MD  Electronically Signed     HIE/MEDQ  D:  03/06/2007  T:  03/07/2007  Job:  [id number]

## 2010-12-12 NOTE — Consult Note (Signed)
NAMEBROOX, [name]                 ACCOUNT NO.:  [id number]   MEDICAL RECORD NO.:  [id number]          PATIENT TYPE:  INP   LOCATION:  4705                         FACILITY:  [facility]   PHYSICIAN:  Oretha Milch, MD      DATE OF BIRTH:  Jul 30, 1946   DATE OF CONSULTATION:  03/03/2007  DATE OF DISCHARGE:                                 CONSULTATION   REFERRING PHYSICIAN:  Dr. Lavera Guise, [facility].   REASON FOR CONSULTATION:  Left lower lobe pulmonary nodule.   PRIMARY CARE PHYSICIAN:  Andi Devon.   CARDIOLOGIST:  Dr. Riley Kill, [facility].   HISTORY OF PRESENT ILLNESS:  Jeffery Copeland is a 65 year old Caucasian  gentleman who presented with right lower quadrant abdominal pain.  A CT  of the abdomen was consistent with typhlitis.  This picked up a left  lower lobe spiculated nodule which on further chest CT was noted to be a  1 x 1.3 cm spiculated opacity within the left lower lobe.  Some scarring  was noted in the lingula and central lobular emphysema was noted.  No  pleural effusions or mediastinal lymphadenopathy was noted.  Hence we  were consulted.   The patient currently denies dyspnea or wheezing, weight loss, fevers.  His main complaint is the abdominal pain.   PAST MEDICAL HISTORY:  1. 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  2. Atrial fibrillation.  On Coumadin.  3. Amiodarone has been stopped in the past due to decrease in      deficient capacity.  Last documented was 63%.  4. Bilateral carotid abnormalities.  5. COPD.   PAST SURGICAL HISTORY:  Mitral valve replacement.   ALLERGIES:  NONE KNOWN.   MEDICATIONS AT HOME:  1. Aspirin 81 mg p.o. every day.  2. Altace 5 mg p.o. every day.  3. Flovent 2 puffs b.i.d.  4. Atrovent 2 puffs b.i.d.  5. Lasix 20 mg p.o. every day.  6. K-Dur 20 mEq p.o. every day.  7. Zetia 10 mg p.o. every day.  8. Celebrex 200 mg p.o. every day.  9. Omeprazole 20 mg p.o. every day.  10.Coumadin 7.5 mg Tuesday,  Thursday, Friday, Saturday, and Sunday, 5      mg on Monday and Wednesday.   SOCIAL HISTORY:  He smoked about 2 packs per day until he quit in 1990  due to cardiac problems.  He quit EtOH use around that time.  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  He is single and lives by  himself.   REVIEW OF SYSTEMS:  Denies fevers, weight loss, loss of appetite.   PHYSICAL EXAMINATION:  GENERAL:  Well-developed male in no acute  respiratory distress.  VITAL SIGNS:  Heart rate 75, afebrile, oxygen saturation 97% on room  air, blood pressure 98/65, respirations 18 per minute.  HEENT:  Normal pharyngeal spacing.  NECK:  Supple.  No JVD.  No lymphadenopathy.  CARDIOVASCULAR:  S1, S2 normal.  CHEST:  Clear to auscultation.  ABDOMEN:  Soft.  Some tenderness in the right lower quadrant.  EXTREMITIES:  No  edema.  CNS:  Nonfocal.   WBC count 17.5, hemoglobin 11.5, platelets 233,000.  Lipase 15, albumin  3.4.  INR 5.  Creatinine 1.3.   IMPRESSION:  1. Left lower lobe spiculated nodule as described above.  2. Likely chronic obstructive pulmonary disease with decreased      deficient capacity documented in the past secondary to amiodarone.  3. Mitral valve replacement.  On Coumadin, no coagulopathic.  4. Acute abdomen secondary to typhlitis, being conservatively managed.   RECOMMENDATIONS:  1. I discussed with Mr. Santaana the probability of this nodule being a      malignancy.  I discussed the possible methods of obtaining a      diagnosis including a bronchoscopy and perhaps a trans CT-guided      needle biopsy.  However, given the small size of the nodule, the      use of bronchoscopy would be extremely low.  I also explained that      a CT-guided needle biopsy will almost definitely involve      complications of a pneumothorax.  2. Maybe a better option would be to assess pulmonary function and      obtain a PET scan to look for mediastinal lymphadenopathy or other       involvement.  If the PET scan is positive and lights up only in the      nodule, then we may just refer him to thoracic surgery or lobectomy      provided he has good pulmonary function.  If he does not have      adequate pulmonary function, we may just decide to follow up his      nodule.   I know that he will need outpatient follow up accordingly.  Follow up  was obtained on March 13, 2007 at 2:10 p.m. with me.  Pulmonary  function tests will be obtained at that time.   If possible, a PET scan should be scheduled on discharge during this  admission.      Oretha Milch, MD  Electronically Signed     RVA/MEDQ  D:  03/03/2007  T:  03/03/2007  Job:  [id number]

## 2010-12-12 NOTE — Assessment & Plan Note (Signed)
[facility]                            CARDIOLOGY OFFICE NOTE   BRODRICK, CURRAN                        MRN:          [id number]  DATE:05/30/2007                            DOB:          1945/11/10    The patient is in for follow-up.  To briefly summarize, he is stable.  He has not been having any ongoing chest pain, however, he does have  pain in his shoulder and in his back.  He has been seeing Salvatore Decent.  Cornelius Moras, M.D. on a weekly basis.  The patient underwent left lower lobe  resection for a benign nodule.  He has had some pleural effusion noted  by chest x-ray, but not a definite pneumothorax.  Dr. Cornelius Moras is scheduled  to see him back again next week.  There has not been any significant  ongoing chest pain.   MEDICATIONS:  1. Aspirin 81 mg daily.  2. Coumadin.  3. Flovent inhaler.  4. Atrovent inhaler.  5. Furosemide 20 mg daily.  6. Zetia 10 mg daily.  7. Celebrex 200 mg daily.  8. Klor-Con 20 mEq b.i.d.  9. Altace 5 mg daily.  10.Omeprazole 20 mg daily.  11.Metoprolol 25 mg daily.   PHYSICAL EXAMINATION:  GENERAL:  He is alert and oriented in no acute  distress.  VITAL SIGNS:  Blood pressure 120/69, pulse 76.  LUNGS:  There is decreased breath sounds in the left base.  There is  dullness to percussion also in this area.  HEART:  Rhythm is regular without significant murmur.   The electrocardiogram demonstrates normal sinus rhythm.  There are no  acute changes.   IMPRESSION:  1. Status post mitral valve replacement.  2. Ejection fraction 50% in July of 2007.  3. Left lower lobe nodule.  4. Paroxysmal atrial fibrillation.   PLAN:  1. Return to clinic in six weeks.  2. Basic metabolic profile and CBC to make sure his hemoglobin is      coming up.     [name], MD, [facility]  Electronically Signed    TDS/MedQ  DD: 05/30/2007  DT: 05/31/2007  Job #: [id number]

## 2010-12-12 NOTE — Discharge Summary (Signed)
Jeffery Copeland, Jeffery Copeland                 ACCOUNT NO.:  [id number]   MEDICAL RECORD NO.:  [id number]          PATIENT TYPE:  INP   LOCATION:  2019                         FACILITY:  [facility]   PHYSICIAN:  Salvatore Decent. Cornelius Moras, M.D. DATE OF BIRTH:  July 18, 1946   DATE OF ADMISSION:  04/29/2007  DATE OF DISCHARGE:  05/03/2007                               DISCHARGE SUMMARY   HISTORY OF PRESENT ILLNESS:  The patient is a 65 year old white male  with a known history of chronic obstructive pulmonary disease and remote  history of tobacco use, however, extensive with some 45 years of use.  He recently underwent abdominal CT scan in the setting of abdominal pain  related to colitis.  Serendipitously, a solitary nodule in the left  lower lobe of the left lung was discovered, and a full chest CT  performed on August 3 confirmed this as a spiculated 1.3 cm lesion  suspicious for a primary lung carcinoma.  There was no evidence of  mediastinal lymphadenopathy.  There were no other pulmonary parenchymal  lesions.  There were no pleural effusions.  A PET scan performed March 06, 2007, confirmed a slightly increased metabolic activity within this  pulmonary lesion with a maximum SUV measured at 1.2.  Dr. Edwyna Shell and  then Dr. Cornelius Moras evaluated the patient and recommended surgical resection.  He was admitted this hospitalization for the procedure.   PAST MEDICAL HISTORY:  COPD with an FEV-1 of 2.61 or 83% predicted.   OTHER DIAGNOSES:  1. Hypertension.  2. Degenerative joint disease.  3. Gastroesophageal reflux disease.  4. Cerebrovascular disease.  5. Chronic atrial fibrillation.  6. History of mitral valve replacement for bacterial endocarditis      using a bioprosthetic tissue valve in 1999.  7. Remote history of tobacco use.  8. History of ischemic colitis.  9. History of nonischemic cardiomyopathy.   MEDICATIONS PRIOR TO ADMISSION:  1. Celebrex 200 mg daily.  2. Zetia 10 mg daily.  3. Coumadin 5 mg  daily.  4. Potassium chloride 20 mEq daily.  5. Omeprazole 20 mg daily.  6. Altace 5 mg daily.  7. Flovent inhaler.  8. Atrovent inhaler.  9. Lasix 20 mg daily.  10.Aspirin 81 mg daily.   ALLERGIES:  No known drug allergies.   FAMILY HISTORY SOCIAL HISTORY REVIEW OF SYSTEMS PHYSICAL EXAMINATION:  Please see the History and Physical done at the time of admission.   HOSPITAL COURSE:  The patient was admitted electively and on April 29, 2007, taken to the operating room where he underwent the following  procedures.  1. Flexible bronchoscopy.  2. Left video-assisted thoracoscopy and miniature muscle-sparing      thoracotomy for left lower lobectomy.   The patient tolerated procedure well and was taken to the post  anesthesia care unit in stable condition.   POSTOP HOSPITAL COURSE:  The patient has done well.  He has remained  hemodynamically stable.  He has remained neurologically intact.  All  routine lines, monitors, and drainage devices have been discontinued in  the standard fashion.  His laboratory values do  reveal a moderate  postoperative anemia.  This is stable.  Most recent hemoglobin and  hematocrit dated May 01, 2007, are 8.9 and 27.3, respectively.  His  pathology is remarkable only for benign lymph nodes.  There is no  evidence of malignancy.  This has been discussed with the patient.  The  the patient was seen and evaluated on May 03, 2007, and deemed to be  acceptable discharge at that time.   INSTRUCTIONS:  The patient received written instructions regarding  medications, activity, diet, wound care and followup.  Followup will  include Dr. Donata Clay for Dr. Cornelius Moras as Dr. Cornelius Moras will be at [facility]  rotation next month.  Dr. Donata Clay will see the patient on May 16, 2007, at 1:15 with a chest x-ray.  Additionally, he will have  appointment arranged to have the chest suture removal at the [facility] office  next week.  They will call with this time.    MEDICATIONS:  1. Lopressor 25 mg twice daily.  2. For pain, Tylox one or two every 6 hours as needed.  3. Celebrex 200 mg daily.  4. Zetia 10 mg daily.  5. Coumadin 5 mg daily.  Note:  The patient has instructions to follow      up with the Coumadin Clinic at [facility] next week.  6. Klor-Con b.i.d.  7. Omeprazole 20 mg daily.  8. Altace 5 mg daily.  9. Furosemide 20 mg daily.  10.Baby aspirin 81 mg daily.  11.Flovent inhaler 4 times a day.  12.Atrovent inhaler 4 times a day.   ADDENDUM:  The patient did have a short episode of atrial fibrillation  postoperatively.  He has been in normal sinus rhythm after starting the  Lopressor.   FINAL DIAGNOSES:  Left lung nodule, now status post miniature muscle-  sparing thoracotomy for left lower lobectomy.  Pathology is negative for  malignancy.   OTHER DIAGNOSES:  1. Paroxysmal atrial fibrillation postoperatively.  2. Postoperative acute blood loss anemia.  3. Chronic obstructive pulmonary disease.  4. Hypertension.  5. Degenerative joint disease.  6. Gastroesophageal reflux disease.  7. Extracranial cerebrovascular arterial occlusive disease, chronic.  8. History of chronic atrial fibrillation.  9. History of mitral valve replacement.  10.History of remote tobacco abuse.  11.History of ischemic colitis.  12.History of nonischemic cardiomyopathy.      Rowe Clack, P.A.-C.      Salvatore Decent. Cornelius Moras, M.D.  Electronically Signed    WEG/MEDQ  D:  05/03/2007  T:  05/04/2007  Job:  [id number]   cc:   Larina Earthly, M.D.  [name], MD, [facility]  Anselmo Rod, M.D.

## 2010-12-12 NOTE — H&P (Signed)
HISTORY AND PHYSICAL EXAMINATION   April 23, 2007   Re:  Jeffery Copeland, Jeffery Copeland           DOB:  1945-10-03   ADDENDUM  This is an addendum to a note that was previously started earlier today,  job [id number].   CURRENT MEDICATIONS:  1. Celebrex 200 mg daily.  2. Zetia 10 mg daily.  3. Coumadin 5 mg daily.  4. Potassium chloride 20 mEq daily.  5. Omeprazole 20 mg daily.  6. Altace 5 mg daily.  7. Flovent inhaler.  8. Atrovent inhaler.  9. Lasix 20 mg daily.  10.Aspirin 81 mg daily.   DRUG ALLERGIES:  None known.   PHYSICAL EXAMINATION:  GENERAL:  The patient is a well-appearing male  who appears somewhat older than stated age but is in no acute distress.  VITAL SIGNS:  Blood pressure 142/84, pulse 87, oxygen saturation 98% on  room air.  HEENT:  Exam is grossly unrevealing.  NECK:  Supple.  There is no palpable cervical nor supraclavicular  lymphadenopathy.  There is no jugular venous distention.  There are no  carotid bruits.  CHEST:  Auscultation reveals clear breath sounds which are symmetrical  bilaterally.  No wheezes or rhonchi are noted.  CARDIOVASCULAR:  Exam demonstrates regular heart rhythm.  No murmurs,  rubs or gallops are appreciated.  ABDOMEN:  Soft, nontender.  The liver edge is not palpable.  There are  no palpable masses.  Bowel sounds are present.  EXTREMITIES:  Warm and adequately perfused.  There is no lower extremity  edema.  Distal pulses are not palpable in either lower leg at the ankle.  There is no sign of  significant venous insufficiency.  SKIN:  Clear, dry and healthy appearing throughout.  NEUROLOGIC:  Grossly nonfocal and symmetrical throughout.  RECTAL AND GU:  Exams are both deferred.  OTHER:  The remainder of physical exam is unrevealing.   DIAGNOSTIC TESTS:  Chest CT scan performed 03/02/2007 is reviewed.  This  demonstrates a spiculated, 1.3-cm lesion in the left lower lobe  suspicious for primary lung cancer.  There are  no other significant  pulmonary parenchymal lesions.  There are no pleural effusions.  There  is no sign of mediastinal nor hilar lymphadenopathy.  There is no sign  of metastatic disease to the liver or adrenal glands.   PET/CT scan performed 03/06/2007 is reviewed.  This confirms slightly  increased metabolic activity within the suspicious left lower lobe lung  lesion with maximum SUV of 1.2.  No other abnormalities are noted.   Pulmonary function tests performed 03/28/2007 are reviewed.  These  demonstrate findings consistent with mild chronic obstructive lung  disease.  Specifically, the FEV1 measured 2.61 L or 83% predicted.  This  did not change much with bronchodilator therapy.  The forced vital  capacity was 3.92 L or 88% predicted.  The FEF25-75 was somewhat reduced  at 1.39 L or 46% predicted.  The residual volume was slightly increased  at 2.80 L or 121% predicted.  Diffusion capacity was 56% predicted.   IMPRESSION:  Suspicious spiculated 1.3 cm lesion in the left lower lobe  completely surrounded by lung parenchyma consistent with likely T1, N0,  stage IA lung cancer.  This lesion is highly suspicious and should be  considered cancer until proven otherwise.  Alternatives at this time  include proceeding directly to surgery versus continued close  observation with serial radiographic exams.  This lesion is too small  for needle  biopsy, and negative needle biopsy would not preclude the  diagnosis of malignancy.  I think the patient would tolerate surgery;  and, if this in fact is malignant, he would likely tolerate lobectomy  for curative surgical resection.  I do not see a role for lung sparing  surgery such as segmentectomy or wedge resection with seed implantation,  as this technology has not been proven efficacious and the patient would  likely tolerate formal lobectomy if need be.   PLAN:  I have discussed options at length with the patient here in the  office today.   Alternative treatment strategies have been reviewed.  He  has an appointment to see Dr. Bonnee Quin tomorrow for routine annual  followup; and we will make sure that Dr. Riley Kill feels that the patient  would tolerate surgery as described.  Assuming this is the case, we will  tentatively plan to proceed with surgery on Tuesday, September 30, for  left VATS and possible thoracotomy for pulmonary resection, possible  lobectomy.  The patient understands and accepts all potential associated  risks of surgery, including but not limited to risk of death, stroke,  myocardial infarction, congestive heart failure, respiratory failure,  pneumonia, prolonged air leak requiring chest tube drainage, bleeding  requiring blood transfusion, prolonged chest wall pain, or late  recurrence of malignancy if, in fact, this turns out to be cancer.  All  of his questions have been addressed.  He has been instructed to stop  taking Coumadin today.   Jeffery Copeland, M.D.  Electronically Signed   CHO/MEDQ  D:  04/23/2007  T:  04/24/2007  Job:  [id number]   cc:   Larina Earthly, M.D.  [name], MD, [facility]  Anselmo Rod, M.D.

## 2010-12-12 NOTE — Consult Note (Signed)
Jeffery Copeland                 ACCOUNT NO.:  [id number]   MEDICAL RECORD NO.:  [id number]          PATIENT TYPE:  INP   LOCATION:  4705                         FACILITY:  [facility]   PHYSICIAN:  Jonelle Sidle, MD DATE OF BIRTH:  March 14, 1946   DATE OF CONSULTATION:  DATE OF DISCHARGE:                                 CONSULTATION   PRIMARY CARDIOLOGIST:  [name], MD, [facility].   REQUESTING PHYSICIAN:  Lonia Blood, M.D.   REASON FOR CONSULTATION:  Follow up of anticoagulation with a history of  atrial fibrillation and bioprosthetic mitral valve replacement.   HISTORY OF PRESENT ILLNESS:  Jeffery Copeland is a 65 year old male with a  history of chronic obstructive pulmonary disease, hypertension,  paroxysmal atrial fibrillation previously treated with Amiodarone  (discontinued within the year), and coronary artery disease status post  previous cardiac catheterization in 2006 demonstrating no major  obstructive disease but evidence of a non-ischemic cardiomyopathy.  At  that time his ejection fraction was in the 20% range although  subsequently this has improved to the 50% range based on available  information.  He is status post placement of a bioprosthetic mitral  valve back in 1999 and has done relatively well.  He was last seen in  the office by Dr. Riley Kill in May with symptoms of modest dyspnea on  exertion but no chest pain.  He denies having any active palpitations  and has been followed through the Coumadin Clinic with no major bleeding  problems.  He is now admitted to the hospital with right-sided colitis  and also findings of a lung nodule.  There are plans potentially for a  colonoscopy and possibly a biopsy of the lung nodule and we have been  asked to assist with questions regarding anticoagulation.  At the  present time, [name] is supra-therapeutic on his Coumadin with an INR  of 5.0.  He is not having any active bleeding.   ALLERGIES:  NO KNOWN DRUG ALLERGIES.   PRESENT MEDICATIONS:  1. Senokot-S one tablet p.o. q.h.s.  2. Lasix 20 mg p.o. daily.  3. Potassium chloride 20 mEq p.o. twice daily.  4. Zetia 10 mg p.o. daily.  5. Flovent 2 puffs twice daily.  6. Coumadin as followed by pharmacy.  7. Atrovent 2 puffs twice daily.  8. Celebrex 200 mg p.o. daily.  9. Protonix 40 mg p.o. daily.   At home, I also note that he was on:  1. Altace 5 mg p.o. daily.  2. Aspirin 81 mg p.o. daily.   PAST MEDICAL HISTORY:  As discussed above.  Additional problems include:  1. 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  2. Degenerative joint disease also noted.   SOCIAL HISTORY:  The patient is divorced.  He has two children.  He  lives in [place].  He has a longstanding tobacco use history.  The  patient stopped drinking alcohol in 1999.   FAMILY HISTORY:  Reviewed and significant for heart attack in the  patient's mother who died at age 38  and prostate cancer in the patient's  father who died at age 70.   REVIEW OF SYSTEMS:  As described in the history of present illness.  He  specifically denies any exertional chest pain.  No palpitations.  No  syncope.  No obvious bleeding problems.  Recent complaints have been of  significant right-sided abdominal discomfort.   PHYSICAL EXAMINATION:  VITAL SIGNS:  Temperature 98.7 degrees.  Heart  rate 75 and regular.  Respirations 18.  Systolic blood pressure 98-128  over diastolic of 65-76.  Oxygen saturation is 97% on room air.  Review  of telemetry reveals no significant atrial fibrillation.  GENERAL:  This is a chronically ill-appearing male in no acute distress.  HEENT:  Conjunctivae normal.  Oropharynx clear.  NECK:  Supple.  No loud carotid bruits.  No thyromegaly is noted.  LUNGS:  Diminished breath sounds throughout without active wheezing.  CARDIAC:  Regular rate and rhythm.  Very soft systolic murmur.  No  diastolic murmur or pericardial rub.   ABDOMEN:  Soft, mildly tender but no guarding.  Bowel sounds are  present.  EXTREMITIES:  No significant pitting edema.  SKIN:  Warm and dry.  MUSCULOSKELETAL:  No kyphosis is noted.  NEUROPSYCHIATRIC:  The patient is alert and oriented times three.   LABORATORY/DATA:  WBC 17.5, hemoglobin 12.9, hematocrit 38.0, platelets  233.  INR is 5.0.  Sodium 136, potassium 3.9, chloride 102.  BUN 10,  creatinine 1.3, glucose 113.   Chest CT with contrast from August 3 demonstrates a 1 x 1.3 cm  spiculated left lower lobe lesion.  No pleural effusions or definite  adenopathy noted.  Baseline emphysematous changes are seen.   A 12-lead electrocardiogram from today demonstrates normal sinus rhythm  with nonspecific ST changes.   IMPRESSION:  1. History of paroxysmal atrial fibrillation previously on Amiodarone      although maintaining normal sinus rhythm on no specific rate      control medications.  He has been managed as an outpatient on      Coumadin and at this point is supra-therapeutic.  In addition he      has a history of previous bioprosthetic mitral valve replacement.      At this point it may be that he needs a colonoscopy and potentially      even a lung biopsy.  There should be no contraindication to holding      his Coumadin at this point.  His major thromboembolic risk would be      more related to his atrial fibrillation rather than his      bioprosthetic mitral valve.  2. Previously-documented nonobstructive coronary atherosclerosis.      Ejection fraction has been as low at 15-20% although improved to      50% by follow-up echocardiogram in July of 2007.  Mitral valve      function was appropriate at that time.  3. Chronic obstructive pulmonary disease with findings of a lung      nodule suspicious for malignancy.  4. Right-sided colitis and abdominal pain.   RECOMMENDATIONS:  I discussed the situation with the patient as well as  with Dr. Lavera Guise.  I agree with holding  Coumadin at this time and certainly  this can be held temporarily for necessary diagnostic procedures.  His  major thromboembolic risks would be more related to paroxysmal atrial  fibrillation rather than his mitral valve in that it is a bioprosthetic  valve.  He is presently in  normal sinus rhythm and denies having any  active palpitations.  One could  consider bridging him with heparin although it may be that his risk of  bleeding is actually higher with this approach than his risk of  thrombosis off of Coumadin temporarily.  Dr. Riley Kill will follow up on  the patient and can proceed from there.      Jonelle Sidle, MD  Electronically Signed     SGM/MEDQ  D:  03/03/2007  T:  03/03/2007  Job:  [id number]   cc:   [name], MD, [facility]  Lonia Blood, M.D.

## 2010-12-12 NOTE — Cardiovascular Report (Signed)
NAMEANDERSON, MIDDLEBROOKS                 ACCOUNT NO.:  [id number]   MEDICAL RECORD NO.:  [id number]          PATIENT TYPE:  OIB   LOCATION:  2899                         FACILITY:  [facility]   PHYSICIAN:  [name], MD, FACCDATE OF BIRTH:  [birth year]   DATE OF PROCEDURE:  01/18/2009  DATE OF DISCHARGE:  01/18/2009                            CARDIAC CATHETERIZATION   INDICATIONS:  [name] is a 65 year old gentleman who presents with his  atrial fibrillation.  He has had prior valve surgery.  He has had prior  cardioversion.  He has been well anticoagulated, with documented INRs in  excess of 2 for more than 3 weeks.  Risks, benefits, and alternatives  were discussed with the patient, he agreed to proceed.   PROCEDURE:  Cardioversion.   DESCRIPTION OF PROCEDURE:  The patient was brought to the short stay  area of the hospital.  He was prepped in the usual fashion.  AP leads  were placed.  Anesthesia was in attendance, and he was given 100 mg of  propofol.  With appropriate anesthesia, a 120-joule biphasic  defibrillation was delivered with immediate restoration of sinus rhythm.  There were no complications.  He was alert following the procedure.  He  tolerated this well.      [name], MD, [facility]  Electronically Signed     [name], MD, [facility]  Electronically Signed    TDS/MEDQ  D:  01/18/2009  T:  01/19/2009  Job:  [id number]   cc:   Merlene Laughter. Renae Gloss, M.D.  [facility]

## 2010-12-12 NOTE — Assessment & Plan Note (Signed)
[facility]                            CARDIOLOGY OFFICE NOTE   Jeffery Copeland, Jeffery Copeland                        MRN:          [id number]  DATE:06/10/2007                            DOB:          Mar 15, 1946    [name] is in for followup visit.  He slowly and gradually is  improving.  He feels stronger than he did last week.  About a week ago,  he was noted to continue to have a hemoglobin of 8.9.  I, therefore,  started him on some iron.  He had had chest surgery, but had not been on  iron up until that point in time.  He is feeling really quite a bit  better.  He still hurts in the shoulders and in the neck, but he has  seen Dr. Barry Dienes in the interim, and there has not been anything new done.  He did have a repeat chest x-ray on November the 3rd which revealed a  stable, moderate, left pleural effusion, and the thoracic compression  fracture.   MEDICATIONS:  1. Flovent inhaler 4 times a day.  2. Atrovent inhaler 4 times a day.  3. Furosemide 20 mg daily.  4. Aspirin 81 mg daily.  5. Zetia 10 daily.  6. Celebrex daily.  7. Altace 5 mg daily.  8. Omeprazole 20 mg daily.  9. Metoprolol 25 mg daily.  10.Klor-Con 20 mEq daily.  11.Ferrous sulfate 325 b.i.d.   PHYSICAL EXAMINATION:  He is alert and oriented.  The blood pressure is now 124/70, the pulse 72, there is decreased  breath sounds at the left base which is unchanged.  The remainder of the  examination is unremarkable.   [name] is clinically improved.  He will remain on the current medical  regimen, and he will return in 3 weeks for followup.  At that time a  followup CBC will be warranted following his chest surgery.     [name], MD, [facility]  Electronically Signed    TDS/MedQ  DD: 06/11/2007  DT: 06/12/2007  Job #: [id number]   cc:   MD Barry Dienes

## 2010-12-12 NOTE — Telephone Encounter (Signed)
I spoke with the pt and made him aware of carotid results.  Copy of carotid mailed to Dr [name].

## 2010-12-12 NOTE — Assessment & Plan Note (Signed)
[facility]                             PULMONARY OFFICE NOTE   Jeffery Copeland, Jeffery Copeland                        MRN:          [id number]  DATE:03/28/2007                            DOB:          Apr 19, 1946    Jeffery Copeland is a 65 year old ex-smoker with a 1.3 cm spiculated opacity in  the left lower lobe.  This was picked up incidentally on a CT Copeland that  was performed for abdominal pain which was subsequently diagnosed as  typhlitis.  Jeffery Copeland was mildly positive in this area.  The  mediastinum seemed free of disease.  He is currently asymptomatic,  denies weight loss or dyspnea.  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  Amiodarone has been discontinued due to  decreasing defusion capacity.  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   CURRENT MEDICATIONS:  1. Aspirin.  2. Coumadin.  3. Flovent 2 puffs b.i.d.  4. Atrovent 2 puffs b.i.d.  5. Furosemide 20 mg daily.  6. Zetia 10 mg daily.  7. Celebrex 200 mg daily.  8. Omeprazole 20 mg daily.  9. Potassium 20 mEq b.i.d.  10.Enalapril 5 mg daily.   EXAM:  Weight 179, blood pressure 102/64, heart rate 91, oxygen  saturation 90% on room air.  CHEST:  Clear to auscultation.  No rhonchi.  CV:  S1, S2 normal.  EXTREMITIES:  No edema.   PFTs show an FEV1/FVC ratio of 66.  An FEV runoff of 83% an FVC of 88%.  There is no significant bronchodilator response.  Diffusion capacity was  decreased at 56%.  Arterial blood gas was not performed.   IMPRESSION:  1. Left lower lobe mildly positive nodule is described.  2. Mild chronic obstructive pulmonary disease in this heavy ex-smoker.   RECOMMEND:  1. I discussed options with Jeffery Copeland including wait and watch approach      with a repeat CT Copeland in 3 months, a CT-guided needle aspiration of      this nodule, and proceeding  with a lobectomy.  I explained to him      that there would be a high chance of lung puncture requiring a      chest tube if we proceeded with the biopsy.  He prefers to obtain a      surgical opinion before proceeding.  Accordingly, we will refer him      to Dr. Edwyna Copeland and give him some more time to make this decision.  2. I understand that he is also scheduled for a colonoscopy, and he is      on Coumadin for atrial fibrillation so that this can be held in      case surgery or biopsy is planned.     Oretha Milch, MD  Electronically Signed    RVA/MedQ  DD: 03/28/2007  DT: 03/30/2007  Job #: [id number]   cc:   Merlene Laughter. Renae Gloss, M.D.  [name], MD, [facility]  Ines Bloomer,  M.D. 

## 2010-12-12 NOTE — Assessment & Plan Note (Signed)
Jeffery Copeland                             PULMONARY OFFICE NOTE   Jeffery Copeland, Jeffery Copeland                        MRN:          [id number]  DATE:06/16/2007                            DOB:          02-Apr-1946    Jeffery Copeland is a 65 year old ex-smoker who underwent a left lower lobectomy  via left thoracotomy for a 1.3-cm spiculated opacity in the left lower  lobe.  This turned out to be a benign nodule.  He still has a lot of  soreness in his left chest, and his main complaint is back pain which I  believe is nerve root pain.  Last chest x-ray showed volume loss in the  left lower lobe and small effusion which is to be expected.  His  preoperative FEV1 was 2.61 (83%) and FEV1/FVC ratio was 66.  Diffusion  capacity was 56%.  His dyspnea has not much increased.   OTHER MEDICAL PROBLEMS:  1. 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  2. Atrial fibrillation on Coumadin.  Amiodarone was tried and      discontinued in the past due to decreasing diffusion capacity.   CURRENT MEDICATIONS:  1. Aspirin 81 mg daily.  2. Flovent MDI 2 puffs b.i.d.  3. Atrovent MDI 4 times a day as needed.  4. Furosemide 20 mg daily.  5. Zetia 10 mg daily.  6. Celebrex 200 mg daily.  7. Altace 5 mg daily.  8. Omeprazole 20 mg daily.  9. Metoprolol 25 mg daily.  10.Iron sulfate 325 mg b.i.d.  11.Potassium 20 mEq daily.   PHYSICAL EXAMINATION:  VITAL SIGNS:  On exam today, weight 179 pounds.  Temperature 97.8, blood pressure 108/62, heart rate 66 per minute,  oxygen saturation 99% on room air.  CARDIOVASCULAR:  S1 and S2, normal.  CHEST:  Decreased breath sounds in the left base.  ABDOMEN:  Soft, nontender.  EXTREMITIES:  No edema.   IMPRESSION:  1. Status post left lower lobectomy with postoperative recovery as      expected.  2. Mild chronic obstructive pulmonary disease with likely some      worsening of lung function.      This will be quantitated in the  future.  He will continue Flovent      and Atrovent nebulizers.  3. Flu shot and Pneumovax was administered today.     Jeffery Milch, MD  Electronically Signed    RVA/MedQ  DD: 06/16/2007  DT: 06/17/2007  Job #: [id number]   cc:   Merlene Laughter. Renae Gloss, M.D.

## 2010-12-12 NOTE — Assessment & Plan Note (Signed)
[facility]                            CARDIOLOGY OFFICE NOTE   Kyndall, Chaplin JAROLD MACOMBER                        MRN:          [id number]  DATE:03/07/2007                            DOB:          11-19-1945    Mr. Ayala is in for a followup visit.  In general, he has been admitted  to the hospital with some abdominal discomfort.  A CT scan of the  abdomen ended up revealing a left lower lobe nodule.  A chest x-ray done  here a year ago was unremarkable and read by Audie Pinto, M.D. from the  [facility] radiologists.  The patient is feeling somewhat better.  He may need  to have his lobectomy.  The patient is on Coumadin.  He has been started  on Cipro as well as Flagyl.  He is remaining on these at the present  time.  I am not aware that he has had a followup Coumadin at this point  in time.  He was supratherapeutic, and he was sent back out on Coumadin.   PHYSICAL EXAMINATION:  VITAL SIGNS:  The blood pressure is 132/64.  The  pulse is 91.  LUNGS:  Fields are clear.  CARDIAC:  Rhythm is regular.   His carotid Doppler studies revealed 60-79% REICA and 40-59% left ICA.   His EKG reveals normal sinus rhythm with nonspecific ST abnormalities.   IMPRESSION:  1. Status post mitral valve replacement with a tissue valve for      endocarditis.  2. Atrial fibrillation with rapid ventricular response, eventually      resolved, treated with amiodarone which subsequently was placed on      hold.  3. Significant chronic obstructive pulmonary disease.  4. Bilateral carotid abnormalities.  5. New left lower lobe nodule.  6. Recent abdominal pain, now on Cipro and Flagyl.   The patient needs followup in the Coumadin Clinic, we will try to  arrange this today.     [name], MD, [facility]  Electronically Signed    TDS/MedQ  DD: 03/07/2007  DT: 03/07/2007  Job #: [id number]

## 2010-12-12 NOTE — H&P (Signed)
HISTORY AND PHYSICAL EXAMINATION   April 23, 2007   Re:  Jeffery Copeland, Jeffery Copeland           DOB:  [birth year]   CHIEF COMPLAINT:  Left lung nodule.   HISTORY OF PRESENT ILLNESS:  Mr. Kresse is a 65 year old white male with a  known history of chronic obstructive pulmonary disease and a remote  history of tobacco abuse, who recently underwent an abdominal CT scan in  the setting of abdominal pain related to colitis.  Serendipitously a  solitary nodule in the left lower lobe of the left lung was discovered  and a full chest CT scan performed on March 02, 2007, confirmed as a  spiculated 1.3 cm lesion in the left lower lobe, suspicious for primary  lung cancer.  There is no sign of mediastinal lymphadenopathy.  There  are no other pulmonary parenchymal lesions.  There are no pleural  effusions.  A PET scan was performed on March 06, 2007, confirming  slightly increased metabolic activity within this solitary pulmonary  lesion, maximum FUV measured 1.2.  The patient was initially seen and  evaluated by Dr. Ines Bloomer and subsequently has been referred for  possible surgical intervention, versus continued observation of this  suspicious lung lesion.   REVIEW OF SYSTEMS:  GENERAL:  Mr. Delsanto reports that presently he is  doing well.  His previous symptoms related to colitis have resolved.  His appetite is good.  His energy level is stable.  RESPIRATORY:  The  patient has chronic exertional shortness of breath that is relatively  mild and stable.  The patient denies a productive cough, hemoptysis or  wheezing.  CARDIAC:  The patient has chronic exertional shortness of  breath.  The patient is treated for chronic atrial fibrillation.  He  denies any symptoms of resting shortness of breath, PND, orthopnea or  lower extremity edema.  He denies any palpitations or syncope.  He has  done quite well since his heart surgery in 1999.  GASTROINTESTINAL:  The  patient reports his  bowel function is now back to normal.  He denies  hematochezia,  hematemesis or melena.  His previous symptoms related to  colitis have resolved.  He underwent a colonoscopy on April 14, 2007, by Dr. Anselmo Rod, that reportedly revealed an ulcer in the  cecum with biopsy c/w ischemic colitis.  MUSCULOSKELETAL:  Notable for  chronic changes of mild to moderate degenerative arthritis.  These  symptoms are stable.  The patient's ambulation is fairly good.  His  mobility level is adequate.  NEUROLOGIC:  Negative.  The patient denies  symptoms suggestive of recent TIA or stroke.  The patient has a remote  history of seizure disorder but has not had a seizure in years.  GENITOURINARY:  Negative.  HEENT:  Negative.  PSYCHIATRIC:  Negative.   PAST MEDICAL HISTORY:  1. Chronic obstructive pulmonary disease with FEV-I 2.61 liters, or      83% predicted.  2. Hypertension.  3. Degenerative joint disease with osteoarthritis.  4. GE reflux disease.  5. Cerebrovascular disease.  6. Chronic atrial fibrillation.  7. Status post mitral valve replacement for bacterial endocarditis,      using bi-prosthetic tissue valve in 1999.  8. Remote tobacco abuse.  9. Recent ischemic colitis.  10.Non-ischemic cardiomyopathy.   SOCIAL HISTORY:  The patient is divorced and lives alone here in  [place].  He quit smoking two years ago.  He has a remote history of  alcohol abuse but he has not had any alcohol in years.   CURRENT MEDICATIONS:   Salvatore Decent. Cornelius Moras, M.D.  Electronically Signed   CHO/MEDQ  D:  04/23/2007  T:  04/23/2007  Job:  [id number]

## 2010-12-12 NOTE — Assessment & Plan Note (Signed)
[facility]                            CARDIOLOGY OFFICE NOTE   Jeffery Copeland, Jeffery Copeland                        MRN:          [id number]  DATE:07/12/2008                            DOB:          Nov 09, 1945    [name] is in for a followup.  From his general standpoint, he is doing  reasonably well.  He presented to the emergency room with a cellulitis.  He was placed on antibiotics and this has improved.  He has been seen  back in followup by [name], she has also sent him a nice letter  detailing his lipid results.  He denies any ongoing chest pain.  His INR  was high, but his Coumadin was cut back in 3 weeks ago, his INR was down  to 2.9.  He denies any current chest pain.   MEDICATIONS:  1. Flovent inhaler 4 times a day.  2. Atrovent inhaler 4 times a day.  3. Furosemide 20 mg daily.  4. Zetia 10 mg daily.  5. Altace 5 mg daily.  6. Omeprazole 20 mg daily.  7. Klor-Con 20 mEq daily.  8. Ferrous sulfate b.i.d.  9. Coumadin as directed.  10.Metoprolol tartrate 25 mg p.o. b.i.d.   On physical, he is alert and oriented in no distress.  Blood pressure is  118/86, pulse 77.  Lung fields really are clear.  I do not hear murmur  at mitral area.   His electrocardiogram demonstrates normal sinus rhythm without any acute  changes.   Most recent laboratory studies showed a hemoglobin of 13.4, hematocrit  of 40, MMCV of 94.  His uric acid was 7.1.   Overall, the patient is improved.  We plan to see him back in followup  in about 3 months and at that time we will get a 2-D echocardiogram to  reassess his overall valve function.  I do not hear a specific murmur,  and his electrocardiogram is basically unchanged.  Should he have any  problems in the interim, he is to call us.     [name], MD, [facility]  Electronically Signed    TDS/MedQ  DD: 07/12/2008  DT: 07/13/2008  Job #: [id number]

## 2010-12-12 NOTE — Letter (Signed)
October 31, 2007    Merlene Laughter. Renae Gloss, M.D.  [address]  [address]  [place], [place] [address]   RE:  Jeffery Copeland, Jeffery Copeland  MRN:  [id number]  /  DOB:  Mar 09, 1946   Dear Selena Batten:   I had the pleasure of seeing Morse Brueggemann in the office today in follow-  up.  He seems to be getting along well.  He brought me up to date on his  recent activities.  He showed me the letter from your office regarding  his laboratory studies.  My assumption based on this is that the  hemoglobin had normalized.  In addition, he has seen Charna Elizabeth and my  understanding is that he underwent both upper and lower endoscopy.  I do  not have access to these records.  Nonetheless, the patient tells me  that he had an ulcer.  He has been getting samples of AcipHex but he has  been taking omeprazole as well.  When I reviewed his medicines today,  they included:  1. AcipHex 20 mg daily.  2. Omeprazole 20 mg daily.  3. Flovent inhaler.  4. Atrovent inhaler.  5. Furosemide 20 mg daily.  6. Zetia 10 mg daily.  7. Altace 5 mg daily.  8. Klor-Con 20 mEq daily.  9. Ferrous sulfate 325 b.i.d.  10.Warfarin.  11.Metoprolol tartrate 25 mg p.o. b.i.d.   On physical examination, he is alert and oriented.  The blood pressure  was 96/64 but when rechecked by me was 110/70.  The pulse was 80.  There  was decreased breath sounds bilaterally at the bases only, but no rales  and no significant dullness to percussion.  My assumption is that this  represents a postoperative change.  His cardiac rhythm was unremarkable  and was regular.  The extremities reveal trace edema.   Overall, Mr. Hawkes appears to be stable at the present time.  I plan to  see him back in follow-up in 4 months time.  I have encouraged him to  follow up with you.  He will remain followed in our Coumadin Clinic.  I  appreciate the opportunity of sharing in this nice gentleman's care.    Sincerely,      [name], MD, [facility]  Electronically Signed    TDS/MedQ  DD: 10/31/2007  DT: 10/31/2007  Job #: [id number]

## 2010-12-12 NOTE — Op Note (Signed)
NAMESTYLIANOS, STRADLING                 ACCOUNT NO.:  [id number]   MEDICAL RECORD NO.:  [id number]          PATIENT TYPE:  AMB   LOCATION:  ENDO                         FACILITY:  [facility]   PHYSICIAN:  Anselmo Rod, M.D.  DATE OF BIRTH:  1946-01-21   DATE OF PROCEDURE:  09/17/2007  DATE OF DISCHARGE:  09/17/2007                               OPERATIVE REPORT   PROCEDURE PERFORMED:  Esophagogastroduodenoscopy with core biopsy x 1.   ENDOSCOPIST:  Anselmo Rod, M.D.   ENDOSCOPY INSTRUMENT USED:  Pentax video pan endoscope.   INDICATIONS FOR PROCEDURE:  A 65 year old white male with a history of  COPD and lung cancer status post partial lobectomy done by Dr. Karle Plumber,  undergoing an EGD for iron deficiency anemia.  The patient has  a history of mitral valve replacement and has a porcine valve.  He has  been maintained on Coumadin which he has now stopped prior to the  procedure.  The patient was told to stop his aspirin by Dr. Riley Kill on  his last visit.   PREPROCEDURE PREPARATION:  The patient was fasted for 4 hours prior to  the procedure, and the risks and benefits were discussed with him in  detail.   PREPROCEDURE PHYSICAL:  VITAL SIGNS:  Stable.  NECK:  Supple.  CHEST:  Clear to auscultation, decreased breath sounds at the bases.  Surgical scar present on the lateral aspect of the chest from previous  thoracotomy.  ABDOMEN:  Soft, nontender, with normal bowel sounds.   DESCRIPTION OF PROCEDURE:  The patient was placed in the left lateral  decubitus position.  With 70 mcg of Fentanyl and 7 mg of Versed  intravenously given in slow incremental doses, once the patient was  adequately sedated and maintained on low-flow oxygen and continuous  cardiac monitoring, the Pentax video panendoscope was advanced through  the mouthpiece over the tongue into the esophagus under direct vision.  The proximal esophagus appeared normal. There was questionable Barrett-  like changes  above the Z-line, but biopsies were not done as the patient  was still on Coumadin.  On advancing the scope into the stomach, a  superficial ulcer was seen in the antrum.  One small biopsy was done to  rule out the presence of H. pylori by CLO-test.  There are multiple  antral erosions, as well ; the proximal small bowel appeared normal.  There was no ulcer or obstruction.  The patient tolerated the procedure  well without complications.  Retroflexion  in high cardia revealed no  evidence of a hiatal hernia.   IMPRESSION:  1. Normal-appearing proximal esophagus.  2. ?Barrett's mucosa above the Z-line.  Biopsies were not done as the      patient is on Coumadin.  3. Superficial ulcer with multiple erosions in the antrum; one biopsy      done to rule out H. pylori.  4. Normal proximal small bowel.   RECOMMENDATIONS:  1. The patient has been advised to come to the office tomorrow.  He      will be given samples of  Aciphex  which he will take on a b.i.d.      basis,  20 minutes before breakfast and 20 minutes before dinner.  2. The patient is to avoid all nonsteroidals per Dr. Rosalyn Charters      instructions.  3. Await CLO-test results.  4. Treat with antibiotics if  Clo is positive.  5. Outpatient follow up in the next two weeks for further      recommendations.      Anselmo Rod, M.D.  Electronically Signed     JNM/MEDQ  D:  09/18/2007  T:  09/19/2007  Job:  [id number]   cc:   [name], MD, [facility]  [name]  [address]  [place]  [place] [address]   Ines Bloomer, M.D.  [address]  [place]  [place] [address]   Merlene Laughter. Renae Gloss, M.D.  Fax: [phone]

## 2010-12-12 NOTE — H&P (Signed)
Jeffery Copeland, Jeffery Copeland                 ACCOUNT NO.:  [id number]   MEDICAL RECORD NO.:  [id number]          PATIENT TYPE:  INP   LOCATION:  4705                         FACILITY:  [facility]   PHYSICIAN:  Della Goo, M.D. DATE OF BIRTH:  1945-09-13   DATE OF ADMISSION:  03/02/2007  DATE OF DISCHARGE:                              HISTORY & PHYSICAL   PRIMARY CARE PHYSICIAN:  Dr. Andi Devon.   CHIEF COMPLAINT:  Right-sided abdominal pain.   HISTORY OF PRESENT ILLNESS:  This is a 65 year old male presenting to  the emergency department secondary to complaints of severe abdominal  pain that he describes as beginning in the left lower quadrant and then  went across the abdomen into the right lower quadrant.  He reports the  pain is a 9/10 in intensity and is sharp and stabbing.  He does report  having some loose stools associated with crampy abdominal pain.  He  denies having any nausea or vomiting.  He denies passing melena or  having hematochezia.  He denies having any chest pain or shortness of  breath or fevers, chills at this time.   PAST MEDICAL HISTORY:  Significant for mitral valve replacement with  porcine valve in 1999, atrial fibrillation, hypertension.   MEDICATIONS:  1. Lasix 20 mg one p.o. daily.  2. Coumadin 5 mg alternating with 7.5 mg every other day.  3. Zetia 10 mg one p.o. daily.  4. Omeprazole 20 mg one p.o. daily.  5. Atrovent inhaler 2 puffs b.i.d.  6. Potassium chloride 20 mEq one p.o. b.i.d.  7. Celebrex 200 mg one p.o. daily.  8. Aspirin 81 mg one p.o. daily.  9. Flovent 110 mcg 2 puffs b.i.d.   ALLERGIES:  No known drug allergies.   SOCIAL HISTORY:  Former smoker, quit 4 years ago.  Former drinker.  No  history of illicit drug usage.   FAMILY HISTORY:  Noncontributory.   PHYSICAL EXAMINATION FINDINGS:  This is a 61-year-ol,d well-nourished,  well-developed male in discomfort but no acute distress.  VITAL SIGNS:  Temperature 98.2, blood pressure  107/72, heart rate 112,  respirations 18, O2 saturations 100%.  HEENT:  Normocephalic, atraumatic.  Pupils equally round reactive to  light.  Extraocular muscles are intact, funduscopic benign. Oropharynx  is clear.  NECK:  Supple, full range of motion.  No thyromegaly, adenopathy, or  jugular venous distension.  CARDIOVASCULAR:  Regular rate and rhythm.  Mild tachycardia.  No  murmurs, gallops or rubs detected.  LUNGS:  Clear to auscultation bilaterally.  ABDOMEN:  Positive bowel sounds, soft, mildly tender in the right lower  quadrant area.  No rebound, no guarding and no hepatosplenomegaly.  EXTREMITIES:  Without cyanosis, clubbing or edema.  NEUROLOGIC:  Alert  and oriented x3.  Cranial nerves are intact.  Motor and sensory function  intact.   LABORATORY STUDIES:  White blood cell count 17.5, hemoglobin 11.5,  hematocrit 34.8, platelets 233, neutrophils 82%.  Sodium 136, potassium  3.9, chloride 102, bicarb 25.5, BUN 10, creatinine 1.3.  Urinalysis  negative. Protime  47.9, INR 4.7. Liver function tests, albumin  3.4,  total bilirubin 1.4, AST less than 5, ALT 14, alkaline phosphatase 99.  Lipase 15.  CT scan of the abdomen and pelvis reveals a 13.5 mm  spiculated mass in the left lower lobe of the lung consistent with lung  cancer.  A 16.5 mm mass in the dome of the liver, old fractures of T11  and L2, thickened irregular cecum, but normal appendix. Slight stranding  in right lower quadrant, typhlitis versus tumor. Slight stranding in  right lower quadrant but not adjacent to the cecum. Normal terminal  ileum. A CT of the chest was ordered as well after findings of the  spiculated mass to further differentiate the lung mass and the findings  were a 1 x 1.3 cm spiculated left lower lobe lesion highly suspicious  for carcinoma, no adenopathy or effusions were seen, mild cardiomegaly  and COPD changes present.   ASSESSMENT:  A 65 year old male being admitted with:  1. Abdominal  pain, possible colitis.  2. Incidental finding of left lower lobe lung neoplasm.  3. Diarrhea.  4. Coagulopathy secondary to Coumadin therapy.   PLAN:  The patient will be admitted to a telemetry area and placed on a  clear liquid diet at this time. Medications for pain control have been  ordered as needed.  IV Protonix has also been ordered.  Coumadin therapy  has been placed on hold for now and vitamin K has been administered  orally at a 2.5 mg dose.  The PT and INR will be also monitored daily.  Gastroenterology was consulted to further evaluate the possible colitis  and will see the patient in the a.m.  A pulmonology consultation will  also be requested.      Della Goo, M.D.  Electronically Signed     HJ/MEDQ  D:  03/03/2007  T:  03/04/2007  Job:  [id number]   cc:   Merlene Laughter. Renae Gloss, M.D.

## 2010-12-12 NOTE — Letter (Signed)
April 10, 2007   Larina Earthly, M.D.  [address]  [place], [place] [address]   Re:  Jeffery Copeland, Jeffery Copeland                 DOB:  [birth year]   Dear Dr. Felipa Eth:   Mr. Baize is a 65 year old patient with a complicated medical problem. He  has had valvular heart disease, non-ischemic, and chronic atrial  fibrillation. He had a porcine valve placed in 1999 by my partner, Dr.  Cornelius Moras. He also was recently admitted for right sided colitis and is  scheduled to have a colonoscopy by Dr. Loreta Ave and is presently off of  Coumadin. He has had no hemoptysis, fevers, chills, or excessive sputum.  PET scan shows just a slow grade uptake of this lesion, but apparently  it was not evident on past chest x-rays in 2007. There is a 1.3 x 10  millimeter lesion. Pulmonary function test showed an FVC of 4.08, an  FEV1 of 2.99, and a diffusion capacity of 56%. He quit smoking  approximately three years ago.   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   MEDICATIONS:  1. Cipro.  2. Flagyl which he has stopped.  3. Zetia 10 mg daily.  4. Coumadin 5 mg daily.  5. Protonix 40 mg daily.  6. Atrovent.  7. Lasix 20 mg daily.  8. Potassium chloride.  9. Flovent.   PHYSICAL EXAMINATION:  VITAL SIGNS:  Blood pressure 147/90, pulse 90,  respirations 18, saturations 97%.  GENERAL:  He is a well developed Caucasian male in no acute distress.  HEENT:  Unremarkable.  NECK:  Supple with no carotid bruits and no thyromegaly.  LUNGS:  Some bilateral wheezes.  HEART:  Irregularly regular rhythm with 2/6 systolic ejection murmur.  ABDOMEN:  Soft. There is no hepatosplenomegaly.  EXTREMITIES:  Pulses are 2+. There is no clubbing or edema.  NEUROLOGIC:  He is oriented x3.   I think that with his complicated medical problem, we need to  make sure  that his colitis has resolved, and since he is off of Coumadin to get a  colonoscopy. I will go ahead and have a colonoscopy done and then refer  him back to Dr. Cornelius Moras for a decision as far as this lesion. Since this  lesion was not seen on the February 2000 x-ray that is seen now, this is  probably a new lesion and may benefit from wedge resection. If it is  cancer, then I would probably recommend seed implantation, but I will  let Dr. Cornelius Moras make that decision whether to follow him with chronic CT  scans or to proceed with surgery.   Ines Bloomer, M.D.  Electronically Signed   DPB/MEDQ  D:  04/10/2007  T:  04/11/2007  Job:  [id number]   cc:   [name], MD, [facility]  Anselmo Rod, M.D.

## 2010-12-12 NOTE — Assessment & Plan Note (Signed)
[facility]                            CARDIOLOGY OFFICE NOTE   Rui, Wordell TYTON ABDALLAH                        MRN:          [id number]  DATE:04/24/2007                            DOB:          20-Jul-1946    Mr. Shein is in for a follow-up.  In general, he is stable.  He does have  a mass in his left lower lobe,  and I believe it is cancer.  He is  scheduled to have a lobectomy with Dr. Cornelius Moras.  He has not been having any  ongoing chest pain.  Of note, the patient underwent cardiac  catheterization in February 2006.  At that time he had no significant  coronary obstruction.  He had mild pulmonary hypertension and no  significant valvular gradient.  His ejection fraction was down but that  was in the setting of atrial fibrillation.  Importantly, he has had a  follow-up 2 D echo in July 2007.  At that time his EF was 50%.  There  were 7 and 3-mm gradients across the valve.  He stopped his Coumadin  yesterday in anticipation of valve replacement.  He did have a recent  episode of what was thought to be potentially ischemic bowel, but he has  been anticoagulated rather consistently over the past few years so it  would be unusual to have an embolus.   Today on examination, the blood pressure is 130/68, the pulse is 82.  The lung fields actually are reasonably clear despite his situation.  The cardiac rhythm is regular.  There is no definite murmur.  There is  no gallop or rub.  There is trace edema.   EKG was done last month.  This revealed normal sinus rhythm with no  acute changes.  There was nonspecific ST abnormality.   IMPRESSION:  1. Status post mitral valve replacement.  2. Paroxysmal atrial fibrillation, continuously in sinus rhythm.  3. Left lower lobe mass consistent with lung cancer, left lower lobe      mass, scheduled for surgical resection.   PLAN:  No further cardiac workup is needed at this time.  He could have  had an embolic event, but it  would seem a little unusual and his echo  has not been all that revealing.  I would recommend resuming Coumadin  postoperatively.  We would be happy to follow the patient during his  hospitalization.     [name], MD, [facility]  Electronically Signed    TDS/MedQ  DD: 04/24/2007  DT: 04/24/2007  Job #: [id number]   cc:   Merlene Laughter. Renae Gloss, M.D.  Salvatore Decent. Cornelius Moras, M.D.

## 2010-12-12 NOTE — Assessment & Plan Note (Signed)
OFFICE VISIT   Jeffery Copeland, Jeffery Copeland  DOB:  12-11-1945                                        May 16, 2007  CHART #:  [id number]   CURRENT PROBLEMS:  1. Status post left lower lobectomy, 04/29/2007, by Dr. Cornelius Moras for a      benign pulmonary nodule.  2. Status post mitral valve replacement for endocarditis in 1999      (tissue valve).  3. Nonsmoker.  4. Chronic Coumadin therapy for history of atrial fibrillation.   This is the 1st followup visit after undergoing left thoracotomy and  left lower lobectomy for a left pulmonary nodule suspicious for  malignant potential by PET scan.  He underwent left lower lobectomy as  the nodule was deep in the lower lobe and not amenable to wedge  resection.  He did well following surgery and resumed his Coumadin on  discharge because of chronic atrial fibrillation.  He was also on  Altace, Zetia, Flovent inhaler, Lasix 20 mg daily, and 81 mg of aspirin.   Since returning home, he has had no fever.  He complains of some  incisional soreness over the area of the scapula but he denies  significant shortness of breath.  He denies fever.  He had an INR  checked today which was 2.7 on his current dose of Coumadin.   PHYSICAL EXAM:  He is alert, oriented, and comfortable.  Blood pressure  120/70.  Pulse 88.  Respirations 18.  Saturations 98% on room air.  Breath sounds are slightly diminished at the left base, otherwise clear.  The thoracotomy incision is healing well.   MPLO chest x-ray shows no pneumothorax but there is atelectasis effusion  at the left base.  The effusion appears to be somewhat increased since  his last x-ray before he left the hospital.   IMPRESSION/PLAN:  The patient has done well early on following left  lower lobectomy.  The pathology report was benign disease.  He has  residual left basilar atelectasis and effusion.  The incisions are  healing adequately.   I encouraged him to continue with his  incentive spirometer and to take 1  or 2 Ox daily.  He will double his Lasix to 40 mg a day for the next  week.  He will return with a followup chest x-ray in 2 weeks.   Kerin Perna, M.D.  Electronically Signed   PV/MEDQ  D:  05/16/2007  T:  05/18/2007  Job:  [id number]   cc:   Larina Earthly, M.D.  Anselmo Rod, M.D.  [facility] Office

## 2010-12-12 NOTE — Letter (Signed)
February 24, 2008    Merlene Laughter. Renae Gloss, M.D.  [address]  [address]  [place], [place] [address]   RE:  [name], [name]  MRN:  [id number]  /  DOB:  Jun 14, 1946   Dear Cala Bradford,   I had the pleasure of seeing Jeffery Copeland in the office today in  followup. He is scheduled for carotid Dopplers today.  Overall, [name]  has done relatively well since I last saw him.  He seems to have  recovered from his lung procedure.  He denies any cough, significant  progressive shortness of breath, or chest pain.   His medications include:  1. Flovent inhaler 4 times daily.  2. Atrovent inhaler 4 times daily.  3. Furosemide 20 mg daily.  4. Zetia 10 mg daily.  5. Altace 5 mg daily.  6. Omeprazole 20 mg daily.  7. Klor-Con 20 mEq daily.  8. Ferrous sulfate 325 mg b.i.d.  9. Coumadin as directed.  10.Metoprolol tartrate 25 mg b.i.d.   On physical, he is alert and oriented in no distress.  Blood pressure is  120/80 and pulse is 73.  There is no significant mitral regurgitation  noted.  The lung fields are clear to auscultation and percussion.   Electrocardiogram demonstrates normal sinus rhythm with occasional  supraventricular premature beats.  There is borderline first-degree AV  block.   Overall, this gentleman has done relatively well.  He seems to have  recovered nicely from his lobectomy.  He does have bilateral carotid  disease, difficult to tell on exam, with a 60-79% stenosis on the right.  He had repeat ultrasound today and we will forward these results to your  office.  We are also getting a CBC and basic metabolic profile as you  know he has had prior anemia.  He will continue on the same current  medical regimen, and I will see him back in followup in about 4 months'  time.   I appreciate the opportunity of sharing in this gentleman's care.    Sincerely,      Jeffery Copeland. Riley Kill, MD, [facility]  Electronically Signed    TDS/MedQ  DD: 02/24/2008  DT: 02/25/2008  Job #: [id number]

## 2010-12-12 NOTE — Assessment & Plan Note (Signed)
OFFICE VISIT   DEBRA, CALABRETTA  DOB:  1946-03-09                                        June 02, 2007  CHART #:  [id number]   HISTORY OF PRESENT ILLNESS:  The patient returns for a routine followup  status post left lower lobectomy for what turned out to be benign  pulmonary nodules on April 29, 2007.  His postoperative recovery has  been uneventful.  Following hospital discharge he has continued to  gradually improve.  He was seen in followup by Dr. Donata Clay on October  17 and he returns for further followup today.  Overall, the patient is  doing fairly well.  He still has soreness in the left chest which is  made worse with deep cough or physical activity.  He also has a  sensation of numbness radiating anteriorly in the nerve root  distribution, consistent with this thoracotomy.  His appetite is good.  His activity level is overall okay except for the fact that he complains  of increasing pain with ambulation and in his left chest wall  posteriorly.  Remainder of his review of systems is unrevealing.   PHYSICAL EXAMINATION:  Notable for well-appearing male whose surgical  incisions are healing nicely.  There is no palpable mass or abnormality.  Breath sounds are clear with appropriately diminished breath sounds on  the left side, consistent with recent left lower lobectomy.   DIAGNOSTIC TESTS:  Chest x-ray obtained today is reviewed.  Lung fields  remain clear with appropriate volume loss on the left side from left  lower lobectomy.  No other significant abnormalities are noted.   IMPRESSION:  The patient is progressing well following recent left lower  lobectomy.  He still has pain consistent with his thoracotomy, however  this seems to be gradually improving.   PLAN:  I would encourage the patient to continue increasing physical  activity as tolerated.  We will plan to see him back in 2 months with a  followup chest CT scan.  All of his  questions have been addressed.   Salvatore Decent. Cornelius Moras, M.D.  Electronically Signed   CHO/MEDQ  D:  06/02/2007  T:  06/03/2007  Job:  [id number]   cc:   Oretha Milch, MD  Anselmo Rod, M.D.

## 2010-12-13 ENCOUNTER — Other Ambulatory Visit (HOSPITAL_COMMUNITY): Payer: Self-pay | Admitting: Radiology

## 2010-12-13 DIAGNOSIS — I059 Rheumatic mitral valve disease, unspecified: Secondary | ICD-10-CM

## 2010-12-14 ENCOUNTER — Ambulatory Visit (HOSPITAL_COMMUNITY): Payer: Medicaid Other | Attending: Cardiology | Admitting: Radiology

## 2010-12-14 DIAGNOSIS — I4891 Unspecified atrial fibrillation: Secondary | ICD-10-CM | POA: Insufficient documentation

## 2010-12-14 DIAGNOSIS — I059 Rheumatic mitral valve disease, unspecified: Secondary | ICD-10-CM | POA: Insufficient documentation

## 2010-12-15 NOTE — Cardiovascular Report (Signed)
Jeffery Copeland, Jeffery Copeland                 ACCOUNT NO.:  [id number]   MEDICAL RECORD NO.:  [id number]          PATIENT TYPE:  INP   LOCATION:  3737                         FACILITY:  [facility]   PHYSICIAN:  [name], M.D. [facility] OF BIRTH:  10-23-1945   DATE OF PROCEDURE:  09/18/2004  DATE OF DISCHARGE:                              CARDIAC CATHETERIZATION   PROCEDURE:  1.  Left and right heart catheterization.  2.  Selective coronary arteriography.  3.  Selective left ventriculography.   CARDIOLOGIST:  [name], M.D.   INDICATIONS FOR PROCEDURE:  The patient is a 65 year old gentleman who  presents with shortness of breath, a productive cough and atrial  fibrillation, with a rapid ventricular response.  The patient had a mitral  valve replacement in 1999, with a tissue prosthesis.  His ejection fraction  by echocardiogram now is 15%-20%.  The current study was done to assess his  coronary anatomy and right heart pressures.   DESCRIPTION OF PROCEDURE:  The patient was brought to the cardiac  catheterization laboratory and prepped and draped in the usual fashion.  Through an anterior puncture, the right femoral artery was entered.  The  right femoral vein was entered.  A 7-French sheath was placed.  A right  heart catheterization was done with the 7.5-French thermodilution Swan-Ganz  catheter.  She tolerated the procedure well.  This was done without  complications.  We had to use a second Swan-Ganz because the first softened  up, and would not go to the wedge position, so a second Swan-Ganz was passed  to the pulmonary wedge position.  Thermodilution cardiac outputs were  performed.  The superior vena cava saturation was also obtained to rule out  a shunt.  Following this simultaneous wedge LV were performed.  A  ventriculography was performed in the RAO projection.  A coronary  arteriography was performed without complications.  He was taken to the  holding area in  satisfactory clinical condition.   HEMODYNAMIC DATA:  1.  Right atrium:  11.  2.  Right ventricle:  32/8.  3.  Pulmonary artery:  35/18.  4.  Pulmonary capillary wedge:  19.  5.  Aortic:  109/71.  6.  LV:  102/21.  7.  No aortic to left ventricular pressure gradient on pullback.  8.  No significant mitral valve gradient.  9.  Superior vena cava saturation 56%.  10. Pulmonary artery saturation 56%.  11. Aortic saturation 90%.  12. Fick cardiac index 2.04 L per minute, per sq/m.  13. Thermodilution cardiac index:  1.7 L per sq/m.   VENTRICULOGRAPHY:  The ventriculography was performed in the RAO projection.  Overall systolic function is severely reduced.  The ejection fraction  appears to be less than 15%.  The aortic leaflet excursion is reduced, due  to reduced cardiac output.  There does not appear to be significant mitral  regurgitation.  The mitral valve appears to be well-seated.   RESULTS:  1.  LEFT MAIN CORONARY ARTERY:  The left main coronary artery is large and      free  of critical disease.  2.  LEFT ANTERIOR DESCENDING CORONARY ARTERY:  The left anterior descending      coronary artery courses to the apex and is free of critical disease.      There is one major diagonal branch and several septals.  3.  CIRCUMFLEX CORONARY ARTERY:  The circumflex coronary artery provides a      smaller first marginal which is free of significant disease, and a      larger second marginal which also is free of significant disease.  4.  RIGHT CORONARY ARTERY:  The right coronary artery is free of significant      disease, providing a posterior descending and a posterolateral branch.      These are small caliber vessels.   CONCLUSION:  1.  Severe non-ischemic cardiomyopathy.  2.  No critical coronary artery disease.  3.  Well-seated mitral valve prosthesis, without significant gradient or      significant regurgitation.  4.  Reduced cardiac index, compatible with non-ischemic  cardiomyopathy.   DISPOSITION:  I have discussed the case with Dr. Doylene Canning. Ladona Ridgel, who is  from electrophysiology.  Based upon the patient's presentation, he will need  to have follow-up left ventricular function measured after rate control.  The patient will need long-term anticoagulation.  I have discussed the case  with Dr. Merlene Laughter. [place].  Outpatient followup with anticoagulation  control will be recommended with Dr. Renae Gloss.      TDS/MEDQ  D:  09/18/2004  T:  09/18/2004  Job:  [id number]   cc:   Mobolaji B. Corky Downs, M.D.   Merlene Laughter. Renae Gloss, M.D.  [address]  [address]  [place]  [place] [address]  Fax: [phone]   [name], M.D. [facility]   CV Laboratory  -  [facility]

## 2010-12-15 NOTE — Op Note (Signed)
NAMECARDIN, NITSCHKE                 ACCOUNT NO.:  [id number]   MEDICAL RECORD NO.:  [id number]          PATIENT TYPE:  INP   LOCATION:  3737                         FACILITY:  [facility]   PHYSICIAN:  Charlton Haws, M.D.     DATE OF BIRTH:  1945/10/18   DATE OF PROCEDURE:  09/22/2004  DATE OF DISCHARGE:                                 OPERATIVE REPORT   PROCEDURE:  Transesophageal echocardiographic cardioversion.   INDICATION:  1.  Cardiomyopathy.  2.  Congestive heart failure.  3.  Atrial fibrillation.   The patient was sedated with 7 mg of Versed.   Using the usual technique, an on-plane probe was advanced into the distal  esophagus without incident.   Transthoracic imaging was somewhat limited due to difficulty passing the  probe distally.  There was a severe left ventricular cavity enlargement,  global hypokinesis, EF of 25% range.  There was no obvious apical thrombus,  but the apex was not well-visualized.  There was a tissue Carpentier-Edwards  mitral valve in place with no significant regurgitation or stenosis.  The  leaflets were thin and pliable.  There was moderate left atrial enlargement.  Right heart cardiac chambers were normal.  Left atrial appendage was  moderately dilated with spontaneous contrast, but no formed thrombus.  Atrial septum was intact.  There was no significant aortic debris.   The aortic valve was trileaflet and mildly sclerotic.   Since there was no left atrial appendage thrombus, we proceeded on with DC  cardioversion.   A biphasic defibrillator was used.  A single 200 joule synchronized shock  was delivered.  The patient was anesthetized by anesthesia with 125 sodium  Pentothal.  He converted to normal sinus rhythm at a rate of 65.   IMPRESSION:  Successful transesophageal echocardiographic guided  cardioversion.  The patient appeared to tolerate the procedure well.      PN/MEDQ  D:  09/22/2004  T:  09/22/2004  Job:  [id number]

## 2010-12-15 NOTE — Consult Note (Signed)
Jeffery Copeland, Jeffery Copeland                 ACCOUNT NO.:  [id number]   MEDICAL RECORD NO.:  [id number]          PATIENT TYPE:  INP   LOCATION:  3737                         FACILITY:  [facility]   PHYSICIAN:  [name], M.D. [facility] OF BIRTH:  1946/01/10   DATE OF CONSULTATION:  09/14/2004  DATE OF DISCHARGE:                                   CONSULTATION   CHIEF COMPLAINT:  Shortness of breath.   HISTORY OF PRESENT ILLNESS:  Mr. Dyal is a 65 year old gentleman with a  history of mitral valve replacement for bacterial endocarditis and severe  mitral regurgitation.  He had surgery in 1999 by Salvatore Decent. Cornelius Moras, M.D.  He  was admitted on February 14 for shortness of breath and productive cough  with atrial fibrillation and rapid ventricular response.  His saturations  were low.  BNP was elevated.  An echocardiogram was performed, which  revealed ejection fraction of 15-20%.  Cardiac consult was obtained.  The  patient has previously seen Francisca December, M.D.   The patient describes some intermittent chest pain and cramping times nearly  40 years.  He has no exertional symptoms.  He has recently had increased  shortness of breath.  He had a lot of alcohol over the past several months.  He passed out one to two times a month for nearly three to four years.   The patient has a history of tobacco use, hypertension and hyperlipidemia.  He had a porcine valve placed in 1999 for bacterial endocarditis.  He has a  history of Coumadin anticoagulation that has previously been stopped.   Prior to admission the patient was on Flexeril, Lidoderm, Naprosyn and  Darvocet.  He is now on Coumadin, Robitussin, Xopenex, Atrovent, prednisone,  Zithromax, Protonix, Lovenox and metoprolol.   SOCIAL HISTORY:  The patient lives alone, and he is disabled.  He quit  smoking two weeks ago.   FAMILY HISTORY:  Mother died at 7 of MI.  Father died at 63 of cancer.   REVIEW OF SYSTEMS:  Remarkable for neck and back  arthralgias.  He has some  cold and heat intolerance.   PHYSICAL EXAMINATION:  GENERAL:  On his examination, he is a slightly  disheveled gentleman in no acute distress.  VITAL SIGNS:  Blood pressure is 90/70, respiratory rate 18, pulse 90,  temperature 97.6.  NECK:  The jugular veins are not distended.  CHEST:  Decreased breath sounds bilaterally with some expiratory rhonchi.  No definite rales.  CARDIAC:  Cardiac rhythm is irregularly irregular.  I could not hear a  definite murmur.  EXTREMITIES:  No edema.   Recent echocardiogram revealed dilated LV with severely reduced left  ventricular function.  Ejection fraction is 15-20%.  There was akinesis of  the mid-inferoposterior wall.  Aortic valve was trileaflet.  The mitral  valve appears to be with a stented bioprosthesis but without perivalvular  leak.  Right ventricular systolic function was reduced, and the right atrium  was dilated.   IMPRESSION:  The patient is admitted with shortness of breath and atrial  fibrillation with rapid  ventricular response.  His rate is improved.  He had  mild elevation of his enzymes compatible with non-ST elevation  myocardial infarction.  He will need cardiac catheterization with right and  left heart catheterization.  I would stop the Coumadin at this point in  time.  He will need further evaluation with catheterization, and he may  actually need an implantable cardioverter-defibrillator.      TDS/MEDQ  D:  09/14/2004  T:  09/15/2004  Job:  [id number]   cc:   Merlene Laughter. Renae Gloss, M.D.  [address]  [address]  [place]  [place] [address]  Fax: [phone]

## 2010-12-15 NOTE — Assessment & Plan Note (Signed)
[facility]                              CARDIOLOGY OFFICE NOTE   [name], [name]                        MRN:          [id number]  DATE:05/31/2006                            DOB:          February 20, 1946    [name] is in for a follow-up visit.  He has recovered very nicely from his  pelvic fracture and is now motoring around without much difficulty.  The  patient has had a prior mitral valve repair and had a replacement in 1999  with a tissue prosthesis.  He presented by echocardiogram with an EF of 15-  20%.  The patient did not have critical coronary artery disease at the time.  However, he did have significant left ventricular dysfunction and he was  subsequently placed on amiodarone.  He was placed on amiodarone by Dr.  Ladona Ridgel.  His most recent echocardiogram done in July 2007 suggested an EF of  50% with posterior wall motion abnormality; although, I am not sure how  reliable this is.  There was a porcine prosthesis with peak and mean  gradients of 7-mm and 3-mm.  The patient had carotid Dopplers done and this  reveals 60-80% disease bilaterally which is slightly progressed and he is in  for recall August 2008.  His INR has been followed carefully.  He also had  pulmonary function tests because of the amiodarone and his overall DLCL has  gone from 70 to 63.  The patient's chest x-ray reveals findings consistent  with COPD.  There is a little bit of scarring at the bases.  He has  compression deformities.  Liver function studies are unremarkable except for  an alkaline phosphatase of 120.  His thyroid functions were unremarkable in  July 2007.   The patient feels well and he has no specific symptomatology.  He is able to  get around reasonably well.   HIS MEDICATIONS INCLUDE:  1. Aspirin 81 mg daily.  2. Coumadin as directed.  3. Altace 5 mg daily.  4. Omeprazole 20 mg daily.  5. Amiodarone 20 mg daily.  6. Flovent 2 puffs b.i.d.  7.  Atrovent 2 puffs b.i.d.  8. Furosemide 20 mg daily.  9. K-Dur 20 mEq b.i.d.  10.Zetia 10 mg daily.  11.Celebrex.   ON PHYSICAL EXAM:  VITAL SIGNS:  Blood pressure 136/74.  Pulse 64.  LUNG FIELDS:  Really quite clear.  CARDIAC EXAM:  Fairly unremarkable without a significant murmur noted.   IMPRESSION:  1. Status post mitral valve replacement.  2. Left ventricular dysfunction, nearly recovered although recent ejection      fraction is 50%.  3. Atrial fibrillation resulting in LV dysfunction, now normalized on      chronic amiodarone therapy as recommended by electrophysiology.   I have taken the liberty of decreasing his amiodarone to 100 mg a day to see  if we can get away with maintaining sinus rhythm with a lower dose given his  underlying lung disease.  An alternative would be to consider switching the  drug now that he has maintained sinus rhythm  for quite some time and his  left ventricular function is better.  We will see him back in about six  weeks and reassess the entire situation.     [name], MD, [facility]  Electronically Signed    TDS/MedQ  DD: 05/31/2006  DT: 06/01/2006  Job #: [id number]

## 2010-12-15 NOTE — H&P (Signed)
NAMEBRAESON, Jeffery Copeland                 ACCOUNT NO.:  [id number]   MEDICAL RECORD NO.:  [id number]          PATIENT TYPE:  INP   LOCATION:  1830                         FACILITY:  [facility]   PHYSICIAN:  Elliot Cousin, M.D.    DATE OF BIRTH:  1945-12-21   DATE OF ADMISSION:  09/12/2004  DATE OF DISCHARGE:                                HISTORY & PHYSICAL   PRIMARY CARE PHYSICIAN:  Dr. Andi Devon   CHIEF COMPLAINT:  I have trouble breathing.   HISTORY OF PRESENT ILLNESS:  The patient is a 65 year old man with a past  medical history significant for porcine valve replacement in 1999,  degenerative joint disease, and tobacco use, who presents to the emergency  department this morning with a chief complaint of difficulty breathing.  The  patient started having shortness of breath 7 to 8 days ago. The cough was  accompanied by a productive cough with yellow sputum.  The patient also  complained of chest congestion and a few wheezes.  He has never had  pneumonia or acute bronchitis before. He has never been told that he had  emphysema or COPD. The patient had been smoking one pack of cigarettes per  day for nearly 30 years up until approximately 1.5 to 2 weeks ago when he  started becoming short of breath. The patient denies chest pain, pleurisy or  palpitations. He has had no fever or chills. He has had no other upper  respiratory infection symptoms such as a sore throat, ear ache or nasal  congestion. No complaints of headache, nausea, vomiting or diarrhea.   When the patient was evaluated in the emergency department he was found to  be in atrial fibrillation with a heart rate of 135. His chest x-ray reveals  no acute disease. The preliminary I-STAT lab data revealed a pH of 7.268 and  a PCO2 of 66.5. The patient is currently in respiratory distress and will be  admitted for further evaluation and management.  It is important to note  that his oxygen saturation on room air on arrival to  the emergency  department was 79%.   PAST MEDICAL HISTORY:  1.  Status post porcine valve replacement in 1999. The patient cannot recall      which valve was replaced.  2.  Questioned history of atrial fibrillation in the past. At one point the      patient states that he was on Coumadin therapy but it was stopped in the      year 2000. The patient cannot recall the reason why it was stopped.  3.  Degenerative joint disease.  4.  Tobacco abuse, stopped 2 weeks ago.  5.  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 Charna Elizabeth.   MEDICATIONS:  The patient takes a muscle relaxant and another pill for  arthritis, names and doses unknown.   ALLERGIES:  No known drug allergies.   SOCIAL HISTORY:  The patient is divorced. He has two children. He lives  alone  in [place], [place]. He smoked one pack of cigarettes per  day up until 2 weeks ago. The patient smoked for approximately 30 years.  He  stopped drinking alcohol in 1999. He reads and writes a little.  He is  disabled.   FAMILY HISTORY:  His mother died of a heart attack at 43 years of age.  Father died of prostate cancer at 43 years of age.   REVIEW OF SYSTEMS:  Review of systems is positive for chronic shortness of  breath, positive for arthritis pain in his shoulders, knees and hands.  Otherwise his review of systems is negative.   PHYSICAL EXAMINATION:  VITAL SIGNS:  Temperature 97.4, blood pressure  160/113, pulse 73, oxygen saturation 79% on room air. Respiratory rate 28.  GENERAL:  The patient is a 65 year old Caucasian man who appears older than  his stated age, currently in respiratory distress. He is alert and oriented.  HEENT:  Head is normocephalic, atraumatic. Pupils are equal, round and  reactive to light. Extraocular movements are intact. Conjunctivae are clear,  sclerae white. Tympanic membranes are partially obscured by  cerumen  bilaterally. Nasal mucosa is mildly dry. Oropharynx - he does have upper  dentures and no teeth at the bottom. Mucous membranes are mildly dry. No  posterior exudates or erythema.  NECK:  Supple, no adenopathy, no thyromegaly, no bruit, no JVD.  LUNGS:  The patient is dyspneic and tachypneic. He is able to speak in short  sentences before taking another breath.  He does have mild diffuse  expiratory wheezes, particularly in the upper lobes greater than the lower  lobes.  CHEST WALL:  The patient has a well-healed sternotomy scar. Nontender.  HEART:  Distant, irregularly irregular with tachycardia.  ABDOMEN:  Mildly obese, positive bowel sounds, soft, nontender,  nondistended, no hepatosplenomegaly.  EXTREMITIES:  The patient has arthritic changes in his knees and hands and  his feet bilaterally. Pedal pulses 2+ bilaterally. No pedal edema, no  pretibial edema.  NEUROLOGIC:  The patient is alert and oriented x3. Cranial nerves II-XII are  grossly intact. Strength is 5/5 throughout. Sensation is intact.   ADMISSION LABS:  EKG reveals atrial fibrillation with a heart rate of 135. A  chest x-ray revealed no acute disease. Sodium 132, potassium 4.3, chloride  98, BUN 4, glucose 179, pH 7.268, PCO2 66.5, bicarbonate 30.4, creatinine  1.4.  WBC: 15.9 thousand, hemoglobin 13.2, hematocrit 39.5, MCV 93.6,  platelets 232,000.   ASSESSMENT:  1.  Acute hypercapnic respiratory failure. Probably secondary to acute      bronchitis. The patient will need to be admitted to the ICU for further      evaluation and management. He will be placed immediately on BiPAP.   1.  Atrial fibrillation with rapid ventricular response. The patient has a      questionable history of atrial fibrillation, however he is quite unsure      about his past medical history with regards to history of irregular      heart beat.  The patient was on Coumadin at some point in his medical     history. The Coumadin  therapy may have followed the valve replacement.      On auscultation it was difficult to hear a murmur or a click .  The      rapid atrial fibrillation may be in part secondary to pulmonary disease.   1.  Leukocytosis. The patient's white blood cell count is mildly elevated at  15.9 thousand. The leukocytosis may be reactive with an element of      infectious bronchitis as the etiology.   1.  Hyperglycemia. The patient has no known history of diabetes mellitus.   PLAN:  1.  The patient will be placed on BiPAP and oxygen will be titrated to keep      his oxygen saturations greater than or equal to 88%.  Will be cautious      of CO2 retention and will therefore titrate the oxygen down from 100%.      The BiPAP and oxygen therapy will be adjusted per the respiratory      therapy team.  2.  Will check a stat arterial blood gas on 5 liters of nasal cannula oxygen      and BiPAP. Await the results and adjust accordingly.  3.  Will treat with Cardizem 10 mg IV x1 and then start a Cardizem drip      titrated to keep his heart rate less than or equal to 100.  4.  Will check CMET, TSH, CK, CK-MB and troponin I for further evaluation.  5.  Will check hemoglobin A1C.  6.  Will start Solu-Medrol 80 mg IV x1 and then continue q.6h. Adjust and      taper accordingly.  7.  Start empiric antibiotic treatment with Rocephin IV and azithromycin IV.  8.  Will start nebulizer treatment with Xopenex and Atrovent.  9.  Will start anticoagulation with Lovenox and Coumadin.  10. GI prophylaxis with Protonix.      DF/MEDQ  D:  09/12/2004  T:  09/12/2004  Job:  [id number]

## 2010-12-15 NOTE — Discharge Summary (Signed)
Jeffery Copeland, Jeffery Copeland                 ACCOUNT NO.:  [id number]   MEDICAL RECORD NO.:  [id number]          PATIENT TYPE:  INP   LOCATION:  3737                         FACILITY:  [facility]   PHYSICIAN:  Jonna L. Robb Matar, M.D.DATE OF BIRTH:  [birth year]   DATE OF ADMISSION:  09/12/2004  DATE OF DISCHARGE:                                 DISCHARGE SUMMARY   ADDENDUM:   DISCHARGE MEDICATIONS:  1.  Aspirin 81 mg daily.  2.  Amiodarone 400 mg daily.  3.  Coumadin 5 mg daily.  4.  Altace 5 mg daily.  5.  K-Dur 20 mEq b.i.d.  6.  Lasix 20 mg daily.  7.  Toprol-XL 50 mg daily.  8.  Atrovent two puffs b.i.d.  9.  Flovent 110 mcg two puffs b.i.d.  10. Omeprazole 20 mg p.o. daily.   SPECIAL DISCHARGE INSTRUCTIONS:  He is not to use any anti-inflammatories,  but instead use Darvocet.   FOLLOWUP:  He is to call Dr. Merlene Laughter. Renae Gloss and be seen in 2 weeks,  call Dr. Arturo Morton. Stuckey and be seen in 2 weeks and he is also to go to  Dr. Rosalyn Charters office in 4 days for a pro time test.      JLB/MEDQ  D:  09/23/2004  T:  09/25/2004  Job:  [id number]

## 2010-12-15 NOTE — Op Note (Signed)
NAME:  Jeffery Copeland, Jeffery Copeland                       ACCOUNT NO.:  [id number]   MEDICAL RECORD NO.:  [id number]                   PATIENT TYPE:  AMB   LOCATION:  ENDO                                 FACILITY:  [facility]   PHYSICIAN:  Anselmo Rod, M.D.               DATE OF BIRTH:  03-Feb-1946   DATE OF PROCEDURE:  09/22/2003  DATE OF DISCHARGE:                                 OPERATIVE REPORT   PROCEDURE PERFORMED:  Screening colonoscopy.   ENDOSCOPIST:  Charna Elizabeth, M.D.   INSTRUMENT USED:  Olympus video colonoscope.   INDICATIONS FOR PROCEDURE:  The patient is a 65 year old white male  undergoing screening colonoscopy to rule out colonic polyps, masses, etc.   PREPROCEDURE PREPARATION:  Informed consent was procured from the patient.  The patient was fasted for eight hours prior to the procedure and prepped  with a bottle of magnesium citrate and a gallon of GoLYTELY the night prior  to the procedure.   PREPROCEDURE PHYSICAL:  The patient had stable vital signs.  Neck supple.  Chest clear to auscultation.  Abdomen soft with normal bowel sounds.   DESCRIPTION OF PROCEDURE:  The patient was placed in left lateral decubitus  position and sedated with 50 mg of Demerol and 5 mg of Versed intravenously.  Once the patient was adequately sedated and maintained on low flow oxygen  and continuous cardiac monitoring, the Olympus video colonoscope was  advanced from the rectum to the cecum.  The appendicular orifice and  ileocecal valve were clearly visualized and photographed.  No masses,  polyps, erosions, or ulcerations were seen.  A small isolated diverticulum  was seen in the midleft colon.  Small internal hemorrhoids were appreciated  on retroflexion in the rectum.  The patient tolerated the procedure well  without immediate complications.   IMPRESSION:  1. 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  2. Small nonbleeding  internal hemorrhoids.  3. No masses or polyps seen.   RECOMMENDATIONS:  1. Continue high fiber diet with liberal fluid intake.  2. Repeat colorectal cancer screening is recommended in the next 10 years     unless the patient develops any abnormal symptoms in the interim.  3. Outpatient followup as need arises in the future.                                               Anselmo Rod, M.D.    JNM/MEDQ  D:  09/22/2003  T:  09/22/2003  Job:  [id number]   cc:   Merlene Laughter. Renae Gloss, M.D.  [address]  [address]  [place]  [place] [address]  Fax: [phone]

## 2010-12-15 NOTE — Consult Note (Signed)
NAMETRAMPUS, MCQUERRY                 ACCOUNT NO.:  [id number]   MEDICAL RECORD NO.:  [id number]          PATIENT TYPE:  INP   LOCATION:  3737                         FACILITY:  [facility]   PHYSICIAN:  Doylene Canning. Ladona Ridgel, M.D.  DATE OF BIRTH:  1945-09-05   DATE OF CONSULTATION:  09/15/2004  DATE OF DISCHARGE:                                   CONSULTATION   ELECTROPHYSIOLOGY CONSULTATION NOTE:  Consultation is requested by Dr. Charlies Constable.   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   HISTORY OF PRESENT ILLNESS:  The patient is a 65 year old man who has a  history of mitral valve endocarditis and is status post mitral valve  replacement back in 1999.  At that time catheterization demonstrated no  significant coronary disease and preserved LV function.  He presented to the  hospital with increasing shortness of breath for the last 2-3 weeks, worse  in the last week.  He was found to be in atrial fibrillation with a rapid  ventricular response of unknown duration.  He is admitted for additional  evaluation.  The patient has been treated with IV Lasix and bronchodilators  and antibiotics with improvement in his symptoms.  He denies a history of  syncope.  He denies palpitations.  He has not had much in the way of chest  pain.   PAST MEDICAL HISTORY:  As noted above.  The patient has a history of chronic  Coumadin therapy in the remote past __________ many years ago.  He had a  history of dyslipidemia and hypertension.   SOCIAL HISTORY:  The patient has a history of alcohol abuse and tobacco  abuse.  He states that he has stopped both.  He has a history of alcohol-  related seizures.   FAMILY HISTORY:  Is notable for mother dying of an MI at age 71 and father  died of cancer at the same age.   REVIEW OF SYSTEMS:  Is negative for fevers,  chills or night sweats.  He  denies vision or hearing problems.  He denies polyuria, polydipsia, heat or  cold intolerance.  He does admit to intermittent chest pain, shortness of  breath, dyspnea, orthopnea and PND.  He denies peripheral edema or  claudication but does have cough and wheezing sensations.  He denies  dysuria, hematuria or nocturia.  He denies weakness, numbness but does have  generalized anxiety.  He also has generalized arthralgias, this is  particularly bad in the neck and back.  He denies nausea, vomiting,  diarrhea, constipation, polyuria, polydipsia, heat or cold intolerance.   PHYSICAL EXAM:  He is a pleasant, middle-aged, man who is somewhat diskempt  appearing but in no distress.  The initial blood pressure was 100/70, the  pulse was 130 and irregularly irregular, respirations were 22.  HEENT EXAM:  Normocephalic and atraumatic.  Pupils equal and round.  Oropharynx moist.  Sclera anicteric.  NECK:  Revealed 8 cm of jugular venous distention.  There was no  thyromegaly.  Trachea was midline.  LUNGS:  Revealed scattered rales in the bases with decreased breath sounds.  CARDIOVASCULAR EXAM:  Revealed an irregularly irregular tachycardia.  There  were no obvious murmurs.  His heart sounds are somewhat distant.  ABDOMINAL EXAM:  Obese, nontender, nondistended, no organomegaly.  EXTREMITIES:  Demonstrate no cyanosis, clubbing or edema.  NEUROLOGIC EXAM:  Alert and oriented x3 with cranial nerves intact,  __________ was 5/5 and symmetric.   EKG demonstrates atrial fibrillation with a rapid ventricular response.  The  QRS duration was narrow.  The TSH was normal.   OTHER LABS OF IMPORTANCE:  The hemoglobin was 11.6 and the white count was  14.3.  Creatinine was 1.4.   IMPRESSION:  1.  Congestive heart failure.  2.  Atrial fibrillation with a rapid ventricular response.  3.  Status post mitral valve replacement.  4.  Questionable coronary disease with chest pain, by  history, consistent      with heart failure.  5.  Probable bronchitis with gram-positive cocci in pairs in the sputum.   DISCUSSION:  I discussed treatment options with the patient.  I have  recommended that he undergo left and right heart catheterization as  previously scheduled.  I think TEE-guided cardioversion of this patient will  be warranted.  I would recommend starting intravenous heparin or Lovenox and  giving him Coumadin once he has had his catheterization.  I would also  recommend giving him one or two doses of amiodarone prior to TEE  cardioversion to help him maintain sinus rhythm following cardioversion.  Finally, will plan to follow him up after several months to see if his  pelvic function improves.  If not, then a prophylactic ICD implantation with  the severe dilated cardiomyopathy would be warranted in the setting of  congestive heart failure.      GWT/MEDQ  D:  09/15/2004  T:  09/17/2004  Job:  [id number]   cc:   Merlene Laughter. Renae Gloss, M.D.  [address]  [address]  [place]  [place] [address]  Fax: [phone]

## 2010-12-15 NOTE — Discharge Summary (Signed)
Jeffery Copeland, MCMAINS                 ACCOUNT NO.:  [id number]   MEDICAL RECORD NO.:  [id number]          PATIENT TYPE:  INP   LOCATION:  3737                         FACILITY:  [facility]   PHYSICIAN:  Mobolaji B. Bakare, M.D.DATE OF BIRTH:  [birth year]   DATE OF ADMISSION:  09/12/2004  DATE OF DISCHARGE:                                 DISCHARGE SUMMARY   PRIMARY CARE PHYSICIAN:  Merlene Laughter. Renae Gloss, M.D.   FINAL DIAGNOSES:  1.  New onset atrial fibrillation.  2.  Nonischemic cardiomyopathy.  3.  Chronic obstructive pulmonary disease exacerbation.  4.  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  5.  Congestive heart failure.   PROCEDURES:  Cardiac catheterization both right and left catheterization and  ventriculography.  Please see complete report of cardiologist, Dr. Shawnie Pons.   CONCLUSION:  Severe nonischemic cardiomyopathy, no critical coronary artery  disease, well seated mitral valve prosthesis without significant gradient of  significant regurgitation with cardiac index compatible with nonischemic  cardiomyopathy, ejection fraction less than 15%, a 2-D echocardiogram showed  ejection fraction between 15 to 20% and severe LAD left ventricular systolic  function, akinesis of basilar inferoposterior wall with diffuse hypokinesis.   CONSULTATION:  Cardiology consult, Dr. Maisie Fus D. Stuckey   CHIEF COMPLAINT:  Shortness of breath.   BRIEF HISTORY:  Jeffery Copeland is a pleasant 65 year old gentleman who has a  history of porcine mitral valve replacement in 1999 following bacterial  endocarditis.  Since then, he has been doing fairly well okay until he  presented with shortness of breath.  He was found to be in acute hypercapnic  respiratory failure and new onset atrial fibrillation with rapid ventricular  response with a heart rate of 125 at presentation and an oxygen saturation  of 79% on room air.  He was immediately placed on BiPAP to improve his  oxygenation and his rate was controlled with Cardizem drip initially.  He  was subsequently admitted to a monitored bed.  He had diffuse respiratory  wheezes suggestive of chronic obstructive pulmonary disease exacerbation  given the history of significant smoking in the past.  He claimed he quit  smoking 2-3 months prior to hospitalization.   PERTINENT PHYSICAL FINDINGS:  VITAL SIGNS:  Vitals on initial admission had  a heart rate of 135, oxygen saturations of 79% on room air, blood pressure  160/113, temperature 97.4, respirations 28.  LUNGS:  Pertinent findings on physical examination:  He was dyspneic and  tachypneic, spoke in short sentences and is breathing between sentences.  He  had mild diffuse expiratory wheezes but __________  in both upper lobes.  CVS:  S1, S2, irregular with systolic ejection murmur.  ABDOMEN:  Benign.  EXTREMITIES:  No clubbing, cyanosis or edema.  No pitting pedal edema.  There are venous pulses bilaterally.  He was oriented to time, place and  person.   PERTINENT LABORATORY DATA:  BNP was 631.  CBC showed a white cell count of  15.9, hemoglobin 13.2, hematocrit 39.5, platelet count 232.  Neutrophils 82,  lymphocytes of 6.  Creatinine 1.4, sodium  132, potassium 4.3, chloride 98,  glucose 179, BUN 4.  Arterial blood gas on 15 L showed a pH of 7.35, pCO2  48, pO2 211, bicarbonate 27, oxygen saturation is 100%.  Liver function test  was within normal limits.  TSH was 0.647.  Hemoglobin A1c was 6.1.  Sputum  culture grew Streptococcus pneumonia which was pan sensitive to ceftriaxone  and levofloxacin 500 mg, total cholesterol 214.  Triglycerides 85, HDL 37,  LDL 60.   RADIOLOGICAL DATA:  Chest x-ray showed borderline cardiomegaly by apical  parenchymal scarring changes and no definite acute pulmonary findings.  Followup chest x-ray showed mild cardiomegaly with diffuse peribronchial  thickening and no definite edema.   HOSPITAL COURSE:  Problem 1.  Atrial  fibrillation with rapid ventricular  response.  He was initially started on an Cardizem drip, and this was  titrated down and the patient was converted to p.o. Cardizem.  He responded  quite quickly.  In addition, he was started on Lovenox and Coumadin.  He  still remained in atrial fibrillation, and he was seen in consultation with  cardiologist.  It was felt that he would benefit from rhythm control, and he  was placed on amiodarone.  His 2-D echocardiogram came back and showed  significantly low EF.  His Cardizem was discontinued, and the patient was  placed on Toprol XL.  He is currently rate controlled.   Problem 2.  Nonischemic cardiomyopathy with congestive heart failure.  The  patient was started on gentle diuresis with Lasix.  In addition, Altace was  gradually introduced at 5 mg b.i.d., Lasix was 40 mg daily, and he was  receiving Toprol XL 100 mg daily.  He became relatively hypotensive on this  medication.  This was temporarily held.  It was found that he was over  diuresed, hence he received IV fluids for fluid boluses, and Lasix was held  and later reintroduced at a lower dose.  He went for cardiac catheterization  to further evaluate his coronaries, and it was determined that he had no  further significant occlusive lesions.  His fasting lipid profile was  relatively normal except for a low HDL.  Hemoglobin A1c was 6.1.  He had an  electrophysiologist consult, and it was recommended that he should have a  TEE, and he would need to follow up in 8-6 months with a consideration of  AICD if ejection fraction remains less than 35%.   Problem 3. Chronic obstructive pulmonary disease exacerbation.  The patient  was aggressively managed with nebulizer, Xopenex and Atrovent steroid with  antibiotics.  He did quite well.  Currently, he is off steroids and lungs  have no wheezes though he has reduced air entry bilaterally.  He was seen by  tobacco counselor for tobacco cessation, and  the patient was given encouragement to continue to quit  cigarette smoking.  Post-cardiac catheterization, it was felt that the  patient should be anticoagulated with heparin and would be fully  anticoagulated prior to discharge to home.      MBB/MEDQ  D:  09/21/2004  T:  09/21/2004  Job:  [id number]   cc:   [name], M.D. [facility]. Renae Gloss, M.D.  [address]  [address]  [place]  [place] [address]  Fax: [phone]   Doylene Canning. Ladona Ridgel, M.D.

## 2010-12-15 NOTE — Assessment & Plan Note (Signed)
[facility]                            CARDIOLOGY OFFICE NOTE   RYOMA, NOFZIGER                        MRN:          [id number]  DATE:07/15/2006                            DOB:          1946/04/30    Mr.  Certain is in for followup.  He is stable.  He has not been having any  ongoing chest pain or significant shortness of breath.  He has tolerated  going down to 100 mg of amiodarone daily.  With the process of trying to  potentially titrate him off, his diffusion capacity dropped from 70 to  63%.  His rhythm has not recurred.   On his examination today, the weight is 185 pounds. Blood pressure  158/90, pulse of 75 and regular.  There are decreased breath sounds bilaterally.  The cardiac rhythm is regular without significant murmur.   The electrocardiogram demonstrates normal sinus rhythm within normal  limits with a QTc 473 msec.   IMPRESSION:  1. Status post mitral valve replacement.  2. History of left ventricular dysfunction made worse by atrial      fibrillation now recovered and on chronic amiodarone therapy.  3. Chronic obstructive pulmonary disease.  4. Hypercholesterolemia on Zetia.   PLAN:  1. Continue current medical regimen.  2. Return to clinic in 2 months.  3. If the patient is doing well, then we will strongly consider going      to a regimen that is without amiodarone to see if he holds.  He      will remain on anticoagulation at the time.     [name], MD, [facility]  Electronically Signed    TDS/MedQ  DD: 07/15/2006  DT: 07/15/2006  Job #: [id number]

## 2010-12-28 ENCOUNTER — Ambulatory Visit (INDEPENDENT_AMBULATORY_CARE_PROVIDER_SITE_OTHER): Payer: Medicaid Other | Admitting: *Deleted

## 2010-12-28 DIAGNOSIS — I4891 Unspecified atrial fibrillation: Secondary | ICD-10-CM

## 2010-12-28 DIAGNOSIS — Z952 Presence of prosthetic heart valve: Secondary | ICD-10-CM

## 2010-12-28 DIAGNOSIS — I059 Rheumatic mitral valve disease, unspecified: Secondary | ICD-10-CM

## 2011-01-01 ENCOUNTER — Other Ambulatory Visit: Payer: Self-pay | Admitting: *Deleted

## 2011-01-01 MED ORDER — RAMIPRIL 5 MG PO CAPS: 5.0000 mg | ORAL_CAPSULE | Freq: Every day | ORAL | Status: DC

## 2011-01-19 ENCOUNTER — Emergency Department (HOSPITAL_COMMUNITY): Payer: Medicaid Other

## 2011-01-19 ENCOUNTER — Inpatient Hospital Stay (HOSPITAL_COMMUNITY)
Admission: EM | Admit: 2011-01-19 | Discharge: 2011-01-25 | DRG: 552 | Disposition: A | Discharge status: Home Health | Payer: Medicaid Other | Attending: Neurological Surgery | Admitting: Neurological Surgery

## 2011-01-19 DIAGNOSIS — S22009A Unspecified fracture of unspecified thoracic vertebra, initial encounter for closed fracture: Principal | ICD-10-CM | POA: Diagnosis present

## 2011-01-19 DIAGNOSIS — I10 Essential (primary) hypertension: Secondary | ICD-10-CM | POA: Diagnosis present

## 2011-01-19 DIAGNOSIS — I428 Other cardiomyopathies: Secondary | ICD-10-CM | POA: Diagnosis present

## 2011-01-19 DIAGNOSIS — J449 Chronic obstructive pulmonary disease, unspecified: Secondary | ICD-10-CM | POA: Diagnosis present

## 2011-01-19 DIAGNOSIS — I2510 Atherosclerotic heart disease of native coronary artery without angina pectoris: Secondary | ICD-10-CM | POA: Diagnosis present

## 2011-01-19 DIAGNOSIS — J4489 Other specified chronic obstructive pulmonary disease: Secondary | ICD-10-CM | POA: Diagnosis present

## 2011-01-19 DIAGNOSIS — I6529 Occlusion and stenosis of unspecified carotid artery: Secondary | ICD-10-CM | POA: Diagnosis present

## 2011-01-19 DIAGNOSIS — I658 Occlusion and stenosis of other precerebral arteries: Secondary | ICD-10-CM | POA: Diagnosis present

## 2011-01-19 DIAGNOSIS — Z7901 Long term (current) use of anticoagulants: Secondary | ICD-10-CM

## 2011-01-19 DIAGNOSIS — Z952 Presence of prosthetic heart valve: Secondary | ICD-10-CM

## 2011-01-19 DIAGNOSIS — K219 Gastro-esophageal reflux disease without esophagitis: Secondary | ICD-10-CM | POA: Diagnosis present

## 2011-01-19 DIAGNOSIS — R0902 Hypoxemia: Secondary | ICD-10-CM

## 2011-01-19 DIAGNOSIS — E876 Hypokalemia: Secondary | ICD-10-CM | POA: Diagnosis present

## 2011-01-19 DIAGNOSIS — Z87891 Personal history of nicotine dependence: Secondary | ICD-10-CM

## 2011-01-19 DIAGNOSIS — E785 Hyperlipidemia, unspecified: Secondary | ICD-10-CM | POA: Diagnosis present

## 2011-01-19 DIAGNOSIS — I4891 Unspecified atrial fibrillation: Secondary | ICD-10-CM

## 2011-01-19 DIAGNOSIS — W010XXA Fall on same level from slipping, tripping and stumbling without subsequent striking against object, initial encounter: Secondary | ICD-10-CM | POA: Diagnosis present

## 2011-01-19 LAB — CBC
HCT: 41.7 % (ref 39.0–52.0)
Hemoglobin: 14.3 g/dL (ref 13.0–17.0)
MCH: 32.5 pg (ref 26.0–34.0)
MCHC: 34.3 g/dL (ref 30.0–36.0)
RDW: 13.9 % (ref 11.5–15.5)

## 2011-01-19 LAB — POCT I-STAT, CHEM 8
BUN: 13 mg/dL (ref 6–23)
Creatinine, Ser: 1 mg/dL (ref 0.50–1.35)
Glucose, Bld: 130 mg/dL — ABNORMAL HIGH (ref 70–99)
Hemoglobin: 15.6 g/dL (ref 13.0–17.0)
Sodium: 132 mEq/L — ABNORMAL LOW (ref 135–145)
TCO2: 28 mmol/L (ref 0–100)

## 2011-01-19 LAB — DIFFERENTIAL
Basophils Relative: 0 % (ref 0–1)
Eosinophils Relative: 0 % (ref 0–5)
Lymphocytes Relative: 4 % — ABNORMAL LOW (ref 12–46)
Monocytes Absolute: 1.5 10*3/uL — ABNORMAL HIGH (ref 0.1–1.0)
Monocytes Relative: 10 % (ref 3–12)
Neutro Abs: 13.9 10*3/uL — ABNORMAL HIGH (ref 1.7–7.7)

## 2011-01-20 ENCOUNTER — Inpatient Hospital Stay (HOSPITAL_COMMUNITY): Payer: Medicaid Other

## 2011-01-20 LAB — BASIC METABOLIC PANEL
BUN: 13 mg/dL (ref 6–23)
CO2: 31 mEq/L (ref 19–32)
Calcium: 9.1 mg/dL (ref 8.4–10.5)
Creatinine, Ser: 0.74 mg/dL (ref 0.50–1.35)
Glucose, Bld: 102 mg/dL — ABNORMAL HIGH (ref 70–99)
Sodium: 138 mEq/L (ref 135–145)

## 2011-01-20 LAB — CBC
HCT: 36 % — ABNORMAL LOW (ref 39.0–52.0)
Hemoglobin: 12.1 g/dL — ABNORMAL LOW (ref 13.0–17.0)
MCH: 31.6 pg (ref 26.0–34.0)
MCV: 94 fL (ref 78.0–100.0)
RBC: 3.83 MIL/uL — ABNORMAL LOW (ref 4.22–5.81)

## 2011-01-20 LAB — PRO B NATRIURETIC PEPTIDE: Pro B Natriuretic peptide (BNP): 3238 pg/mL — ABNORMAL HIGH (ref 0–125)

## 2011-01-21 ENCOUNTER — Inpatient Hospital Stay (HOSPITAL_COMMUNITY): Payer: Medicaid Other

## 2011-01-21 DIAGNOSIS — J449 Chronic obstructive pulmonary disease, unspecified: Secondary | ICD-10-CM

## 2011-01-21 DIAGNOSIS — R0902 Hypoxemia: Secondary | ICD-10-CM

## 2011-01-21 DIAGNOSIS — I4891 Unspecified atrial fibrillation: Secondary | ICD-10-CM

## 2011-01-21 LAB — PREPARE FRESH FROZEN PLASMA
Unit division: 0
Unit division: 0

## 2011-01-21 LAB — CBC
MCV: 94.8 fL (ref 78.0–100.0)
Platelets: 220 10*3/uL (ref 150–400)
RBC: 4.02 MIL/uL — ABNORMAL LOW (ref 4.22–5.81)
WBC: 18.8 10*3/uL — ABNORMAL HIGH (ref 4.0–10.5)

## 2011-01-21 LAB — MAGNESIUM: Magnesium: 2 mg/dL (ref 1.5–2.5)

## 2011-01-21 LAB — BASIC METABOLIC PANEL
CO2: 26 mEq/L (ref 19–32)
Chloride: 94 mEq/L — ABNORMAL LOW (ref 96–112)
Creatinine, Ser: 0.87 mg/dL (ref 0.50–1.35)

## 2011-01-21 LAB — PHOSPHORUS: Phosphorus: 4.1 mg/dL (ref 2.3–4.6)

## 2011-01-22 DIAGNOSIS — I4891 Unspecified atrial fibrillation: Secondary | ICD-10-CM

## 2011-01-22 DIAGNOSIS — J449 Chronic obstructive pulmonary disease, unspecified: Secondary | ICD-10-CM

## 2011-01-22 DIAGNOSIS — R0902 Hypoxemia: Secondary | ICD-10-CM

## 2011-01-22 LAB — CBC
Hemoglobin: 12.1 g/dL — ABNORMAL LOW (ref 13.0–17.0)
RBC: 3.78 MIL/uL — ABNORMAL LOW (ref 4.22–5.81)

## 2011-01-22 LAB — BASIC METABOLIC PANEL
CO2: 29 mEq/L (ref 19–32)
Glucose, Bld: 100 mg/dL — ABNORMAL HIGH (ref 70–99)
Potassium: 4 mEq/L (ref 3.5–5.1)
Sodium: 130 mEq/L — ABNORMAL LOW (ref 135–145)

## 2011-01-22 NOTE — H&P (Signed)
NAMECHAEL, URENDA                 ACCOUNT NO.:  [id number]  MEDICAL RECORD NO.:  [id number]  LOCATION:  3109                         FACILITY:  [facility]  PHYSICIAN:  Tia Alert, MD     DATE OF BIRTH:  10/16/1945  DATE OF ADMISSION:  01/19/2011 DATE OF DISCHARGE:                             HISTORY & PHYSICAL   DIAGNOSES:  T9 fracture with epidural hematoma and coagulopathy.  BRIEF HISTORY OF PRESENT ILLNESS:  Mr. Dahir is a 65 year old gentleman who states that over 48 hours ago, he fell outside from a standing position.  He twisted and fell to his knees.  He had immediate onset of low back pain.  He is on Coumadin for atrial fibrillation.  He went to primary care physician at [facility] for evaluation.  He has had progressive pain since the fall.  He came to the emergency department today because he was having increasing difficulty with mobilization because of the pain.  He denies any numbness, tingling, or weakness in his legs.  He denies any bowel or bladder changes.  He denies any perineal numbness.  He states he has been urinating fine.  He has been ambulating fine, but the pain was becoming increasingly severe in his thoracic region.  He came to the emergency department.  They did an MRI of the thoracic spine, which showed an acute fracture of T9 with epidural hematoma extending for about T6-T10 with mild cord compression. They felt his neurologic exam was normal.  He was complaining of severe pain and neurosurgical evaluation was requested.  The patient is adamant that he has no lower extremity symptoms.  He has no symptoms radiating around his rib cage into his abdomen.  He only has back pain.  His INR was 5.6.  four units of FFP was ordered by the emergency room physician as well as vitamin K.  We did not want to give him any factor replacement because of the risk of hypercoagulability given the fact that he has a valve replacement.  We are unsure whether this is  a mechanical valve at this point.  Given his normal neurologic exam, this appeared to be reasonable decision making.  PAST MEDICAL HISTORY: 1. Cardiac valve replacement. 2. Atrial fibrillation. 3. Hypertension. 4. Dyslipidemia. 5. Gastroesophageal reflux disease. 6. COPD. 7. He also had biopsy of a lung lesion, which the family tells me was     benign.  He denies diabetes or renal disease.  MEDICATIONS:  Vitamin D, Flexeril, Zetia, Flovent, Lasix, Lorcet, Atrovent, Lopressor, Prilosec, Altace, Coumadin, Ambien, and potassium.  ALLERGIES:  No known drug allergies.  SOCIAL HISTORY:  Remote tobacco history, quit smoking about 6 years ago. He still drinks occasional social alcohol.  PHYSICAL EXAM:  VITAL SIGNS:  His blood pressure is 133/88, pulse is 103- 115 with an irregularly irregular patterns, oxygen saturation 92%, respirations 16.  He weighs 178 pounds. GENERAL:  Cooperative gentleman sitting in a stretcher, though he will not lie flat because of pain.  He is sitting up about 30-40 degrees. HEENT:  Normocephalic and atraumatic.  Extraocular movements are intact. NECK:  Supple and nontender. HEART:  Regular rate and rhythm. EXTREMITIES:  No obvious deformities. NEUROLOGIC:  He is tender in his thoracic region, but has good strength in his lower extremities with good muscle tone and bulk.  He lifts his legs easily off the bed.  He has normal sensation.  His lower extremities are with no evidence of myelopathy.  No abnormal reflexes. He moves his upper extremities well.  There is no facial asymmetry. Tongue protrudes in midline.  He is awake and interactive with no aphasia and good attention span.  IMAGING STUDIES:  MRI of the thoracic spine, I have reviewed as well as report.  This shows multiple compression fractures from T6 down to T9 with an acute fracture of the vertebral body extending back to the pedicles that seems to go to the midportion of the body and seems to  be a Chance type fracture as best I can tell on MRI imaging.  He also has compression fractures at T10 and T11 below this.  The compression fractures above and below T9 are all chronic-appearing fractures.  The only acute-appearing fracture appears to be T9.  There is dorsal epidural signal change, which appears to be hematoma extending from T6- T10 causing only mild canal narrowing and mild cord compression as best I can tell.  There is no signal change within the cord.  ASSESSMENT AND PLAN:  This is very difficult and complex situation with very complex decision making in a 65 year old gentleman with what appears to be a Chance-type T9 fracture with dorsal epidural hematoma. At this point, his neurologic exam is normal, and he is more than 48 hours out from the fall.  I would assume that if he was continuing to bleed in the epidural space because of his coagulopathy, he would already be paraplegic by now and have a much larger hematoma.  Given the fact that this fall took place 2-1/2 days ago, so I think this is probably a stable hematoma at this point and not growing.  However, I would like to reverse his coagulopathy as quickly as we can.  I do not want to use factor replacement at this point and simply use FFP.  We will give Lasix between the units of FFP and we will repeat his PT/INR after the 4 units are in.  He may need more than 4 units.  At this point, I do not think there is any rush to any surgical decompression or stabilization procedure given his normal neurologic exam and the fact that he is more than 48 hours out from his fall again presuming that this hematoma is not expanding at this point.  I have explained all of this to the family as best that I can.  I have tried to answer all of their questions best to my ability.  I have tried to be as detailed as I possibly could and explained all of this decision making to them.  At times, this would be an urgent or emergent  surgical problem given the timing of the fall being greater than 48 hours and is normal neurologic exam.  I think we have time to reverse his coagulopathy.  I would also like to get a CT scan of his thoracic spine to see if this is truly a Chance fracture or other unstable type of fracture, which would require some type of surgical stabilization with a pedicle screw rod construct. We will keep him at bedrest with log or only until that time.  We will go ahead place a Foley catheter and we will  put him in the ICU where they can do neurologic checks every hour.  Then, if he had any changes in the neurological exam, then this will become more of an emergent situation, and we will take him to the operating room at that time.  At this time, I think we can do this as I planned operation instead of an emergent or urgent situation given all of the factors that I have discussed above.  We will keep him n.p.o. through the night in order to gather all of this information.  We will try to control his pain as best as we can. We will order him a TLSO brace because of the fracture.  He will need this whether or not he has surgery.     Tia Alert, MD     DSJ/MEDQ  D:  01/19/2011  T:  01/20/2011  Job:  [id number]  Electronically Signed by Marikay Alar MD on 01/22/2011 05:52:46 AM

## 2011-01-23 DIAGNOSIS — J449 Chronic obstructive pulmonary disease, unspecified: Secondary | ICD-10-CM

## 2011-01-23 DIAGNOSIS — I4891 Unspecified atrial fibrillation: Secondary | ICD-10-CM

## 2011-01-23 DIAGNOSIS — R0902 Hypoxemia: Secondary | ICD-10-CM

## 2011-01-23 LAB — CBC
Hemoglobin: 11.2 g/dL — ABNORMAL LOW (ref 13.0–17.0)
MCH: 31.5 pg (ref 26.0–34.0)
MCV: 93.8 fL (ref 78.0–100.0)
RBC: 3.56 MIL/uL — ABNORMAL LOW (ref 4.22–5.81)

## 2011-01-23 LAB — BASIC METABOLIC PANEL
CO2: 32 mEq/L (ref 19–32)
Calcium: 9.1 mg/dL (ref 8.4–10.5)
Creatinine, Ser: 0.9 mg/dL (ref 0.50–1.35)
Glucose, Bld: 109 mg/dL — ABNORMAL HIGH (ref 70–99)

## 2011-01-24 ENCOUNTER — Inpatient Hospital Stay (HOSPITAL_COMMUNITY): Payer: Medicaid Other

## 2011-01-24 LAB — BASIC METABOLIC PANEL
BUN: 11 mg/dL (ref 6–23)
GFR calc Af Amer: >60 mL/min (ref >60)
GFR calc non Af Amer: >60 mL/min (ref >60)
Potassium: 3.8 mEq/L (ref 3.5–5.1)

## 2011-01-24 LAB — CBC
HCT: 32.3 % — ABNORMAL LOW (ref 39.0–52.0)
MCHC: 33.7 g/dL (ref 30.0–36.0)
RDW: 13.6 % (ref 11.5–15.5)

## 2011-01-25 ENCOUNTER — Encounter: Payer: Medicaid Other | Admitting: *Deleted

## 2011-01-29 NOTE — Discharge Summary (Signed)
NAMEBASILIO, Jeffery Copeland                 ACCOUNT NO.:  [id number]  MEDICAL RECORD NO.:  [id number]  LOCATION:  3018                         FACILITY:  [facility]  PHYSICIAN:  Tia Alert, MD     DATE OF BIRTH:  07-May-1946  DATE OF ADMISSION:  01/19/2011 DATE OF DISCHARGE:  01/25/2011                              DISCHARGE SUMMARY   ADMITTING DIAGNOSIS:  T9 fracture with epidural hematoma.  HISTORY OF PRESENT ILLNESS:  [name] is a 65 year old gentleman with a history of atrial fibrillation and mitral valve replacement who was on Coumadin who presented to the emergency department after a fall.  He complained of severe pain in his back.  He was found to have an acute T9 fracture with epidural hematoma on MRI.  He was admitted to the neurosurgical service for care.  He had an INR of 5.6.  He was given 2 units of FFP and then given the __________ protocol to reverse his coagulopathy.  His INR was then 1.12.  He had no complaints of lower extremity pain, weakness, numbness, tingling and had good strength in his lower extremities, we admitted him to the ICU.  HOSPITAL COURSE:  The patient had a CT scan of the thoracic spine.  This showed a T9 flexion-distraction type compression fracture that involved the anterior and middle columns but not really the posterior column.  I felt this was likely not an overly unstable fracture based on its mid thoracic location and I felt that operative intervention was likely riskier than conservative management with a TLSO brace.  A TLSO was ordered.  He remained in the ICU for several days.  The pulmonologist followed him as well as the cardiologist.  He did have some collapse of his left lung but by the time of discharge, this had reinflated significantly and the medical physicians had signed off because of medical stability.  He remained afebrile and had stable vital signs.  He was saturating fairly well.  He was working with physical and occupational  therapy.  He had some difficulty with understanding spine precautions and remembering to perform spine precautions, but he had good strength in his lower extremities.  He denied any numbness in the lower extremities.  He was voiding well.  He was eating well.  He was fairly mobile with a walker in his brace.  He was discharged home in stable condition on January 25, 2011, with a rolling walker, three in one, and home health physical and occupational therapy.  His plan was to follow up with me in 2 weeks.  He also has a followup with Dr. Shan Levans from Pulmonary.  He was to go ahead and resume taking aspirin but not Coumadin.  We will get an MRI hopefully in a couple of weeks to look for resolution decreasing size of the epidural hematoma.  He will be discharged with Vicodin for pain.  He is to continue his spine precautions.  He is to call for any change in the lower extremity symptoms such as pain, weakness, or numbness.  Otherwise, he is to follow up with me in 2 weeks in the office, we will get plain films at  that time.  FINAL DIAGNOSIS:  T9 fracture with epidural hematoma.    Tia Alert, MD    DSJ/MEDQ  D:  01/25/2011  T:  01/26/2011  Job:  [id number]  Electronically Signed by Marikay Alar MD on 01/29/2011 06:30:25 AM

## 2011-02-01 NOTE — Consult Note (Signed)
Jeffery Copeland, Jeffery Copeland                 ACCOUNT NO.:  [id number]  MEDICAL RECORD NO.:  [id number]  LOCATION:  3018                         FACILITY:  [facility]  PHYSICIAN:  Bevelyn Buckles. Effie Janoski, MDDATE OF BIRTH:  01-28-1946  DATE OF CONSULTATION:  01/23/2011 DATE OF DISCHARGE:                                CONSULTATION   PRIMARY CARDIOLOGIST:  [name], MD, [facility] [place]  PRIMARY CARE PROVIDER:  Merlene Laughter. Renae Gloss, MD  REASON FOR CONSULTATION:  Question of anticoagulation use in a gentleman with a T9 fracture with epidural hematoma and supratherapeutic INR.  PAST MEDICAL HISTORY: 1. 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     a.     Status post Porcine valve replacement in 1999.     b.     Had been on chronic anticoagulation with Coumadin therapy. 2. Chronic atrial fibrillation status post several failed DCCVs.     a.     On chronic anticoagulation with Coumadin therapy. 3. Hypertension. 4. COPD. 5. Gastroesophageal reflux disease. 6. No critical coronary artery disease per cardiac catheterization in     2006. 7. DJD. 8. Bilateral carotid artery disease.     a.     RCA 60-79% and LICA 0-39% per ultrasound in October 2011. 9. Nonischemic cardiomyopathy and LVEF 55% (improved from 15-20%). 10.Lung nodule status post left lower partial lobectomy in 2008, and     was noted to be benign.  HISTORY OF PRESENT ILLNESS:  This is a 65 year old Caucasian gentleman with the above-stated problem list who states he is in his typical state of health until last week when he was outside and he got caught up in fryers causing him to twist and fall.  He had pain in his lower back, and went to see his primary care provider.  At that time, the patient had no complaints aside pain in his lower back.  As the pain increased in his lower back, he did not get relief and presented to the emergency department for further evaluation.  An MRI at that time showed thoracic spine with an acute fracture  at T9 with an epidural hematoma extending T6-T10 with mild cord compression.  His INR was noted to be 5.93 on admission.  The patient was given 4 units of FFP with a return of his INR to subtherapeutic range.  Currently, INR is noted to be 1.12.  The patient was placed in the neuro ICU for monitoring neuro checks closely. Again, the patient denied any lower extremity weakness, paralysis, and no difficulty with urination.  Neurosurgery is following the patient and at this time the surgery is noted.  The patient started working with PT and continued with a brace.  He does have chronic atrial fibrillation, actually had his episode of rapid ventricular response but now his rate is controlled with Lopressor 25 twice daily.  With the epidural hematoma, Cardiology was consulted for further evaluation of his anticoagulation use secondary to his mitral valve replacement and atrial fibrillation.  SOCIAL HISTORY:  The patient lives at home alone.  He is a retired [employer].  He has two sons.  He has history of tobacco abuse, but quit in 2006.  He has a history of alcohol abuse, but quit drinking heavily in 1999, and he occasionally has a beer.  He occasionally uses marijuana.  FAMILY HISTORY:  Noncontributory for premature coronary artery disease. His mother passed away at the age of 54, from myocardial infarction. His father passed away at age 13 from prostate cancer.  ALLERGIES:  CRESTOR.  INPATIENT MEDICATIONS: 1. Vitamin D3 1000 units twice daily. 2. Colace 100 mg twice daily. 3. Zetia 10 mg daily. 4. Flovent inhaled q.6 hours. 5. Lasix 20 mg daily. 6. Lopressor 25 mg twice daily. 7. Protonix 40 mg daily. 8. K-Dur 20 mEq daily. 9. Ramipril 5 mg daily. 10.Spiriva 18 mcg 1 capsule inhaled daily. 11.Ambien 5 mg nightly.  REVIEW OF SYSTEMS:  The patient denies fever, chills, chest pain, nausea, or vomiting.  He does have back pain that is improved since admission.  He has chronic  shortness of breath.  He denies lower extremity edema or palpitations.  All other systems have been reviewed and are negative.  PHYSICAL EXAMINATION:  VITAL SIGNS:  Temperature 97.7, pulse 83, respirations 16, blood pressure 119/75, O2 saturation 94% on room air. GENERAL:  This is a well-developed, well-nourished middle-aged gentleman.  He is no acute distress. HEENT:  Normal. NECK:  Supple without JVD. HEART:  Irregularly irregular without murmur, rub, or gallop. LUNGS:  Clear to auscultation on the right side.  Left side is with diminished breath sounds. ABDOMEN:  Soft, nontender, positive bowel sounds x4. EXTREMITIES:  No clubbing, cyanosis, or edema. MUSCULOSKELETAL:  No joint deformity or effusion. NEUROLOGIC:  Alert and oriented x3.  Cranial nerves II-XII grossly intact.  Chest x-ray on January 21, 2011, showing worsening collapse and/or consolidation in the left lung.  No EKG available.  On telemetry, the patient is noted to be in atrial fibrillation with controlled rate.  LABORATORY DATA:  WBC of 10.9, hemoglobin 11.2, hematocrit 33.4, platelet 194.  Sodium 134, potassium 3.7, BUN 10, creatinine 0.9, INR 1.12. ASSESSMENT AND PLAN:  This is a 65 year old Caucasian gentleman with chronic atrial fibrillation, bioprosthetic mitral valve replacement is now been admitted with an epidural hematoma in the setting of mechanical fall.  Initial INR was supratherapeutic at 5.9 post FFP, INR is currently 1.12.  At this time, we do agree with stopping Coumadin for now.  We would recommend that he be continued on aspirin 325 mg once okayed with Neurosurgery.  Of note, bioprosthetic mitral valve replacement does not require Coumadin.  We can revisit restarting Coumadin or a newer antithrombin agent in few weeks once his hematoma has resolved.  Thank you for this consultation.  We will continue to follow along with you.     Leonette Monarch,  PA-C   ______________________________ Bevelyn Buckles. Kasean Denherder, MD    NB/MEDQ  D:  01/23/2011  T:  01/24/2011  Job:  [id number]  Electronically Signed by Alen Blew P.A. on 02/01/2011 01:16:34 PM Electronically Signed by Arvilla Meres MD on 02/01/2011 03:30:36 PM

## 2011-02-06 ENCOUNTER — Ambulatory Visit: Payer: Medicaid Other | Admitting: Cardiology

## 2011-02-13 ENCOUNTER — Other Ambulatory Visit: Payer: Medicaid Other

## 2011-02-13 ENCOUNTER — Other Ambulatory Visit: Payer: Self-pay | Admitting: Neurological Surgery

## 2011-02-13 DIAGNOSIS — M546 Pain in thoracic spine: Secondary | ICD-10-CM

## 2011-02-14 ENCOUNTER — Ambulatory Visit
Admission: RE | Admit: 2011-02-14 | Discharge: 2011-02-14 | Disposition: A | Discharge status: Home / Self Care | Payer: Medicare Other | Source: Ambulatory Visit | Attending: Neurological Surgery | Admitting: Neurological Surgery

## 2011-02-14 ENCOUNTER — Other Ambulatory Visit: Payer: Self-pay | Admitting: Anesthesiology

## 2011-02-14 ENCOUNTER — Ambulatory Visit
Admission: RE | Admit: 2011-02-14 | Discharge: 2011-02-14 | Disposition: A | Discharge status: Home / Self Care | Payer: Medicare Other | Source: Ambulatory Visit | Attending: Anesthesiology | Admitting: Anesthesiology

## 2011-02-14 DIAGNOSIS — M546 Pain in thoracic spine: Secondary | ICD-10-CM

## 2011-02-14 DIAGNOSIS — S22009A Unspecified fracture of unspecified thoracic vertebra, initial encounter for closed fracture: Secondary | ICD-10-CM

## 2011-02-20 ENCOUNTER — Other Ambulatory Visit: Payer: Medicaid Other

## 2011-02-26 ENCOUNTER — Ambulatory Visit (INDEPENDENT_AMBULATORY_CARE_PROVIDER_SITE_OTHER)
Admission: RE | Admit: 2011-02-26 | Discharge: 2011-02-26 | Disposition: A | Discharge status: Home / Self Care | Payer: Medicare Other | Source: Ambulatory Visit | Attending: Critical Care Medicine | Admitting: Critical Care Medicine

## 2011-02-26 ENCOUNTER — Encounter: Payer: Self-pay | Admitting: Critical Care Medicine

## 2011-02-26 ENCOUNTER — Ambulatory Visit (INDEPENDENT_AMBULATORY_CARE_PROVIDER_SITE_OTHER): Payer: Medicare Other | Admitting: Critical Care Medicine

## 2011-02-26 VITALS — BP 120/66 | HR 83 | Temp 98.0°F | Ht 70.0 in | Wt 175.0 lb

## 2011-02-26 DIAGNOSIS — J449 Chronic obstructive pulmonary disease, unspecified: Secondary | ICD-10-CM

## 2011-02-26 DIAGNOSIS — J4489 Other specified chronic obstructive pulmonary disease: Secondary | ICD-10-CM

## 2011-02-26 NOTE — Patient Instructions (Addendum)
When current Flovent runs out , stop this medication and do not refill Stay on Atrovent two puff two times daily and as needed two additional times per day Return 6 months or sooner if you worsen Remember to get a Flu Vaccine this fall

## 2011-02-26 NOTE — Progress Notes (Signed)
  Subjective:    Patient ID: Jeffery Copeland, male    DOB: 1946-04-11, 66 y.o.   MRN: [id number]  HPI 65 y.o. WM Fell had fx of T9 vertebrae.  Epidural hematoma.  No surgery.  Hx Copd.  Not smoking Since d/c about the same.  Pt notes dyspnea with exertion.  No mucus.  No real chest pain.  Back is still hurting.    Review of Systems Constitutional:   No  weight loss, night sweats,  Fevers, chills, fatigue, lassitude. HEENT:   No headaches,  Difficulty swallowing,  Tooth/dental problems,  Sore throat,                No sneezing, itching, ear ache, nasal congestion, post nasal drip,   CV:  No chest pain,  Orthopnea, PND, swelling in lower extremities, anasarca, dizziness, palpitations  GI  No heartburn, indigestion, abdominal pain, nausea, vomiting, diarrhea, change in bowel habits, loss of appetite  Resp: Notes shortness of breath with exertion not  at rest.  No excess mucus, no productive cough,  No non-productive cough,  No coughing up of blood.  No change in color of mucus.  No wheezing.  No chest wall deformity  Skin: no rash or lesions.  GU: no dysuria, change in color of urine, no urgency or frequency.  No flank pain.  MS:  No joint pain or swelling.  No decreased range of motion.  No back pain.  Psych:  No change in mood or affect. No depression or anxiety.  No memory loss.     Objective:   Physical Exam Filed Vitals:   02/26/11 1633  BP: 120/66  Pulse: 83  Temp: 98 F (36.7 C)  TempSrc: Oral  Height: 5\' 10"  (1.778 m)  Weight: 175 lb (79.379 kg)  SpO2: 94%    Gen: Pleasant, well-nourished, in no distress,  normal affect  ENT: No lesions,  mouth clear,  oropharynx clear, no postnasal drip  Neck: No JVD, no TMG, no carotid bruits  Lungs: No use of accessory muscles, no dullness to percussion, clear without rales or rhonchi  Cardiovascular: RRR, heart sounds normal, no murmur or gallops, no peripheral edema  Abdomen: soft and NT, no HSM,  BS  normal  Musculoskeletal: No deformities, no cyanosis or clubbing  Neuro: alert, non focal  Skin: Warm, no lesions or rashes         Assessment & Plan:   COPD Copd moderate   Plan D/c flovent Stay on atrovent

## 2011-02-27 ENCOUNTER — Encounter: Payer: Self-pay | Admitting: Critical Care Medicine

## 2011-02-28 ENCOUNTER — Telehealth: Payer: Self-pay | Admitting: Critical Care Medicine

## 2011-02-28 NOTE — Progress Notes (Signed)
Quick Note:  Spoke with pt and notified of results per Dr. Wright. Pt verbalized understanding and denied any questions.  ______ 

## 2011-02-28 NOTE — Assessment & Plan Note (Addendum)
Copd moderate   Plan D/c flovent Stay on atrovent

## 2011-02-28 NOTE — Telephone Encounter (Signed)
Spoke with pt and notified of results of cxr. Pt verbalized understanding.

## 2011-03-01 ENCOUNTER — Encounter: Payer: Self-pay | Admitting: Cardiology

## 2011-03-01 ENCOUNTER — Ambulatory Visit (INDEPENDENT_AMBULATORY_CARE_PROVIDER_SITE_OTHER): Payer: Medicare Other | Admitting: Cardiology

## 2011-03-01 DIAGNOSIS — D649 Anemia, unspecified: Secondary | ICD-10-CM

## 2011-03-01 DIAGNOSIS — Z952 Presence of prosthetic heart valve: Secondary | ICD-10-CM

## 2011-03-01 DIAGNOSIS — S064X9A Epidural hemorrhage with loss of consciousness of unspecified duration, initial encounter: Secondary | ICD-10-CM | POA: Insufficient documentation

## 2011-03-01 DIAGNOSIS — D509 Iron deficiency anemia, unspecified: Secondary | ICD-10-CM

## 2011-03-01 DIAGNOSIS — I059 Rheumatic mitral valve disease, unspecified: Secondary | ICD-10-CM

## 2011-03-01 DIAGNOSIS — I4891 Unspecified atrial fibrillation: Secondary | ICD-10-CM

## 2011-03-01 DIAGNOSIS — I10 Essential (primary) hypertension: Secondary | ICD-10-CM

## 2011-03-01 DIAGNOSIS — S064XAA Epidural hemorrhage with loss of consciousness status unknown, initial encounter: Secondary | ICD-10-CM

## 2011-03-01 DIAGNOSIS — I621 Nontraumatic extradural hemorrhage: Secondary | ICD-10-CM

## 2011-03-01 HISTORY — DX: Epidural hemorrhage with loss of consciousness of unspecified duration, initial encounter: S06.4X9A

## 2011-03-01 HISTORY — DX: Epidural hemorrhage with loss of consciousness status unknown, initial encounter: S06.4XAA

## 2011-03-01 LAB — BASIC METABOLIC PANEL
Calcium: 9.2 mg/dL (ref 8.4–10.5)
Chloride: 99 mEq/L (ref 96–112)
Creatinine, Ser: 1.3 mg/dL (ref 0.4–1.5)
GFR: 57.84 mL/min — ABNORMAL LOW (ref >60.00)

## 2011-03-01 NOTE — Assessment & Plan Note (Signed)
See recent echo.  Stable.

## 2011-03-01 NOTE — Patient Instructions (Signed)
Your physician recommends that you schedule a follow-up appointment in: 1 month  

## 2011-03-01 NOTE — Assessment & Plan Note (Signed)
Need to recheck CBC.  He has been modestly anemic.  Is not currently on warfarin.

## 2011-03-01 NOTE — Assessment & Plan Note (Signed)
Currently off of warfarin awaiting ns clearance.

## 2011-03-01 NOTE — Assessment & Plan Note (Signed)
See recent assessment.  Had MRI and is scheduled for a repeat.  Is seeing Dr. Marikay Alar.  Will hold warfarin until we get the clearance.

## 2011-03-01 NOTE — Progress Notes (Signed)
HPI:  Patient is in for follow up.  He fell and was admitted to the hospital with a fracture and epidural hematoma.  He has slowly recovered, although his back still hurts some, and the MRI done last week suggests mostly resolution of the hematoma, but further collapse of the vertebrae.  He is scheduled to see Dr. Yetta Barre at the end of the month.  Per patient, he was told they would make a coumadin decision at that time with regard to resumption.  He feels well.  He did not have syncope, just fell.    Current Outpatient Prescriptions  Medication Sig Dispense Refill  . Cholecalciferol (VITAMIN D3) 1000 UNITS CAPS Take 1 capsule by mouth.        . cyclobenzaprine (FLEXERIL) 5 MG tablet Take 5 mg by mouth as needed.        . ezetimibe (ZETIA) 10 MG tablet Take 10 mg by mouth daily.        . furosemide (LASIX) 20 MG tablet TAKE 1 TABLET BY MOUTH EVERY DAY FOR FLUID  30 tablet  6  . hydrocodone-acetaminophen (LORCET-HD) 5-500 MG per capsule Take 1 capsule by mouth every 6 (six) hours as needed.        Marland Kitchen ipratropium (ATROVENT HFA) 17 MCG/ACT inhaler Inhale 2 puffs into the lungs 2 (two) times daily.       . metoprolol tartrate (LOPRESSOR) 25 MG tablet Take 25 mg by mouth 2 (two) times daily.       Marland Kitchen omeprazole (PRILOSEC) 20 MG capsule Take 20 mg by mouth daily.       . potassium chloride SA (K-DUR,KLOR-CON) 20 MEQ tablet Take 20 mEq by mouth daily.        . ramipril (ALTACE) 5 MG capsule Take 1 capsule (5 mg total) by mouth daily.  30 capsule  6  . zolpidem (AMBIEN) 5 MG tablet Take 5 mg by mouth at bedtime as needed.          Allergies  Allergen Reactions  . Rosuvastatin     Past Medical History  Diagnosis Date  . Iron deficiency anemia, unspecified   . Other diseases of lung, not elsewhere classified   . Other primary cardiomyopathies   . Personal history of other diseases of digestive system   . Personal history of surgery to heart and great vessels, presenting hazards to health   . Acute and  subacute bacterial endocarditis   . Arrhythmia   . Cerebrovascular disease, unspecified   . Esophageal reflux   . Generalized osteoarthrosis, unspecified site   . Hypertension   . COPD (chronic obstructive pulmonary disease) 1999    Past Surgical History  Procedure Date  . 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 Video assisted thoracoscopy     left-and miniature muscle sparing thoracotomy for left lower lobectomy 04/29/2007  dr. Cornelius Moras    No family history on file.  History   Social History  . Marital Status: Divorced    Spouse Name: N/A    Number of Children: N/A  . Years of Education: N/A   Occupational History  . Not on file.   Social History Main Topics  . Smoking status: Former Smoker -- 2.0 packs/day for 40 years    Types: Cigarettes    Quit date: 07/30/2004  . Smokeless tobacco: Never Used  . Alcohol Use: Yes  . Drug Use: Not on file  . Sexually Active: Not  on file   Other Topics Concern  . Not on file   Social History Narrative  . No narrative on file    ROS: Please see the HPI.  All other systems reviewed and negative.  PHYSICAL EXAM:  BP 94/62  Pulse 70  Resp 18  Ht 5\' 10"  (1.778 m)  Wt 171 lb (77.565 kg)  BMI 24.54 kg/m2  General: Well developed, well nourished, in no acute distress. Head:  Normocephalic and atraumatic. Neck: no JVD Lungs: Clear to auscultation and percussion, except decrease in left base slgihtly Heart: Irregularly, irregular with definite murmur.    Abdomen:  Normal bowel sounds; soft; non tender; no organomegaly Pulses: Pulses normal in all 4 extremities. Extremities: No clubbing or cyanosis. No edema. Neurologic: Alert and oriented x 3.  EKG:  Atrial fib with controlled ventricular response.  NO acute changes.    ASSESSMENT AND PLAN:

## 2011-03-15 ENCOUNTER — Encounter: Payer: Self-pay | Admitting: Critical Care Medicine

## 2011-03-20 ENCOUNTER — Other Ambulatory Visit: Payer: Self-pay | Admitting: Neurological Surgery

## 2011-03-20 ENCOUNTER — Ambulatory Visit
Admission: RE | Admit: 2011-03-20 | Discharge: 2011-03-20 | Disposition: A | Discharge status: Home / Self Care | Payer: Medicare Other | Source: Ambulatory Visit | Attending: Neurological Surgery | Admitting: Neurological Surgery

## 2011-03-20 DIAGNOSIS — S22009A Unspecified fracture of unspecified thoracic vertebra, initial encounter for closed fracture: Secondary | ICD-10-CM

## 2011-03-30 ENCOUNTER — Ambulatory Visit (INDEPENDENT_AMBULATORY_CARE_PROVIDER_SITE_OTHER): Payer: Medicare Other | Admitting: *Deleted

## 2011-03-30 DIAGNOSIS — I4891 Unspecified atrial fibrillation: Secondary | ICD-10-CM

## 2011-03-30 DIAGNOSIS — Z952 Presence of prosthetic heart valve: Secondary | ICD-10-CM

## 2011-03-30 DIAGNOSIS — I059 Rheumatic mitral valve disease, unspecified: Secondary | ICD-10-CM

## 2011-03-30 LAB — POCT INR: INR: 1

## 2011-04-06 ENCOUNTER — Ambulatory Visit (INDEPENDENT_AMBULATORY_CARE_PROVIDER_SITE_OTHER): Payer: Medicare Other | Admitting: *Deleted

## 2011-04-06 DIAGNOSIS — I4891 Unspecified atrial fibrillation: Secondary | ICD-10-CM

## 2011-04-06 DIAGNOSIS — Z952 Presence of prosthetic heart valve: Secondary | ICD-10-CM

## 2011-04-06 DIAGNOSIS — I059 Rheumatic mitral valve disease, unspecified: Secondary | ICD-10-CM

## 2011-04-06 LAB — POCT INR: INR: 2.7

## 2011-04-10 ENCOUNTER — Ambulatory Visit: Payer: Medicare Other | Admitting: Cardiology

## 2011-04-19 ENCOUNTER — Ambulatory Visit (INDEPENDENT_AMBULATORY_CARE_PROVIDER_SITE_OTHER): Payer: Medicare Other | Admitting: Cardiology

## 2011-04-19 DIAGNOSIS — I10 Essential (primary) hypertension: Secondary | ICD-10-CM

## 2011-04-19 DIAGNOSIS — I059 Rheumatic mitral valve disease, unspecified: Secondary | ICD-10-CM

## 2011-04-19 DIAGNOSIS — D649 Anemia, unspecified: Secondary | ICD-10-CM

## 2011-04-19 DIAGNOSIS — I621 Nontraumatic extradural hemorrhage: Secondary | ICD-10-CM

## 2011-04-19 DIAGNOSIS — I4891 Unspecified atrial fibrillation: Secondary | ICD-10-CM

## 2011-04-19 DIAGNOSIS — Z952 Presence of prosthetic heart valve: Secondary | ICD-10-CM

## 2011-04-19 DIAGNOSIS — S064XAA Epidural hemorrhage with loss of consciousness status unknown, initial encounter: Secondary | ICD-10-CM

## 2011-04-19 DIAGNOSIS — S064X9A Epidural hemorrhage with loss of consciousness of unspecified duration, initial encounter: Secondary | ICD-10-CM

## 2011-04-19 LAB — CBC WITH DIFFERENTIAL/PLATELET
Basophils Relative: 0.5 % (ref 0.0–3.0)
Eosinophils Relative: 1.9 % (ref 0.0–5.0)
Lymphocytes Relative: 16.6 % (ref 12.0–46.0)
Neutrophils Relative %: 66 % (ref 43.0–77.0)
RBC: 4.11 Mil/uL — ABNORMAL LOW (ref 4.22–5.81)
WBC: 7.7 10*3/uL (ref 4.5–10.5)

## 2011-04-19 LAB — BASIC METABOLIC PANEL
Calcium: 8.9 mg/dL (ref 8.4–10.5)
Chloride: 106 mEq/L (ref 96–112)
Creatinine, Ser: 1.3 mg/dL (ref 0.4–1.5)
GFR: 60.45 mL/min (ref >60.00)

## 2011-04-19 NOTE — Assessment & Plan Note (Signed)
Borderline but stable.

## 2011-04-19 NOTE — Assessment & Plan Note (Signed)
Seems to be recovering without difficulty.  I previously spoke with his surgeon who said he could return to warfarin.

## 2011-04-19 NOTE — Assessment & Plan Note (Signed)
Hemodynamically stable 

## 2011-04-19 NOTE — Assessment & Plan Note (Signed)
Controlled ventricular response 

## 2011-04-19 NOTE — Progress Notes (Signed)
HPI: He is doing well overall.  He trip over a briar patch picking blueberries and got a spinal hematoma.  He was hospitalized, recovered, and had follow up MRI.  He is thought to be ok to resume coumadin, and this was done recently.  He has no new symptoms, and is tolerating well.  He has history of atrial fib after heart surgery.  No chest pain.  Feels good at the present time.  Followed by Dr. Renae Gloss.   Current Outpatient Prescriptions  Medication Sig Dispense Refill  . Cholecalciferol (VITAMIN D3) 1000 UNITS CAPS Take 1 capsule by mouth.        . cyclobenzaprine (FLEXERIL) 5 MG tablet Take 5 mg by mouth as needed.        . ezetimibe (ZETIA) 10 MG tablet Take 10 mg by mouth daily.        . furosemide (LASIX) 20 MG tablet TAKE 1 TABLET BY MOUTH EVERY DAY FOR FLUID  30 tablet  6  . hydrocodone-acetaminophen (LORCET-HD) 5-500 MG per capsule Take 1 capsule by mouth every 6 (six) hours as needed.        . metoprolol tartrate (LOPRESSOR) 25 MG tablet Take 25 mg by mouth 2 (two) times daily.       Marland Kitchen omeprazole (PRILOSEC) 20 MG capsule Take 20 mg by mouth daily.       . potassium chloride SA (K-DUR,KLOR-CON) 20 MEQ tablet Take 20 mEq by mouth daily.        . ramipril (ALTACE) 5 MG capsule Take 1 capsule (5 mg total) by mouth daily.  30 capsule  6  . zolpidem (AMBIEN) 5 MG tablet Take 5 mg by mouth at bedtime as needed.          Allergies  Allergen Reactions  . Rosuvastatin     Past Medical History  Diagnosis Date  . Iron deficiency anemia, unspecified   . Other diseases of lung, not elsewhere classified   . Other primary cardiomyopathies   . Personal history of other diseases of digestive system   . Personal history of surgery to heart and great vessels, presenting hazards to health   . Acute and subacute bacterial endocarditis   . Arrhythmia   . Cerebrovascular disease, unspecified   . Esophageal reflux   . Generalized osteoarthrosis, unspecified site   . Hypertension   . COPD (chronic  obstructive pulmonary disease) 1999    Past Surgical History  Procedure Date  . 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 Video assisted thoracoscopy     left-and miniature muscle sparing thoracotomy for left lower lobectomy 04/29/2007  dr. Cornelius Moras    No family history on file.  History   Social History  . Marital Status: Divorced    Spouse Name: N/A    Number of Children: N/A  . Years of Education: N/A   Occupational History  . Not on file.   Social History Main Topics  . Smoking status: Former Smoker -- 2.0 packs/day for 40 years    Types: Cigarettes    Quit date: 07/30/2004  . Smokeless tobacco: Never Used  . Alcohol Use: Yes  . Drug Use: Not on file  . Sexually Active: Not on file   Other Topics Concern  . Not on file   Social History Narrative  . No narrative on file    ROS: Please see the HPI.  All other systems reviewed and negative.  PHYSICAL  EXAM:  BP 141/86  Pulse 68  Ht 5\' 9"  (1.753 m)  Wt 175 lb (79.379 kg)  BMI 25.84 kg/m2  General: Well developed, well nourished, in no acute distress. Head:  Normocephalic and atraumatic. Neck: no JVD Lungs: Clear to auscultation and percussion. Heart: irregularly irregular rhythm.    Abdomen:  Normal bowel sounds; soft; non tender; no organomegaly Pulses: Pulses normal in all 4 extremities. Extremities: No clubbing or cyanosis. No edema. Neurologic: Alert and oriented x 3.  EKG:  Atrial fib with controlled ventricular response.   ASSESSMENT AND PLAN:

## 2011-04-19 NOTE — Patient Instructions (Signed)
Your physician recommends that you continue on your current medications as directed. Please refer to the Current Medication list given to you today.  Your physician wants you to follow-up in: 4-6 MONTHS.  You will receive a reminder letter in the mail two months in advance. If you don't receive a letter, please call our office to schedule the follow-up appointment.  Your physician recommends that you have lab work today: BMP and CBC

## 2011-04-23 LAB — CLOTEST (H. PYLORI), BIOPSY: Helicobacter screen: NEGATIVE

## 2011-04-24 ENCOUNTER — Ambulatory Visit
Admission: RE | Admit: 2011-04-24 | Discharge: 2011-04-24 | Disposition: A | Discharge status: Home / Self Care | Payer: Medicare Other | Source: Ambulatory Visit | Attending: Neurological Surgery | Admitting: Neurological Surgery

## 2011-04-24 ENCOUNTER — Other Ambulatory Visit: Payer: Self-pay | Admitting: Neurological Surgery

## 2011-04-24 DIAGNOSIS — S22009A Unspecified fracture of unspecified thoracic vertebra, initial encounter for closed fracture: Secondary | ICD-10-CM

## 2011-04-30 ENCOUNTER — Other Ambulatory Visit: Payer: Self-pay | Admitting: Cardiology

## 2011-05-01 LAB — DIFFERENTIAL
Eosinophils Absolute: 0.1 10*3/uL (ref 0.0–0.7)
Eosinophils Relative: 1 % (ref 0–5)
Lymphocytes Relative: 11 % — ABNORMAL LOW (ref 12–46)
Lymphs Abs: 1.4 10*3/uL (ref 0.7–4.0)
Monocytes Relative: 12 % (ref 3–12)
Neutrophils Relative %: 76 % (ref 43–77)

## 2011-05-01 LAB — CBC
HCT: 40.1 % (ref 39.0–52.0)
MCV: 94 fL (ref 78.0–100.0)
RBC: 4.27 MIL/uL (ref 4.22–5.81)
WBC: 12.8 10*3/uL — ABNORMAL HIGH (ref 4.0–10.5)

## 2011-05-03 ENCOUNTER — Ambulatory Visit (INDEPENDENT_AMBULATORY_CARE_PROVIDER_SITE_OTHER): Payer: Medicare Other | Admitting: *Deleted

## 2011-05-03 DIAGNOSIS — Z952 Presence of prosthetic heart valve: Secondary | ICD-10-CM

## 2011-05-03 DIAGNOSIS — I4891 Unspecified atrial fibrillation: Secondary | ICD-10-CM

## 2011-05-03 DIAGNOSIS — I059 Rheumatic mitral valve disease, unspecified: Secondary | ICD-10-CM

## 2011-05-03 LAB — POCT INR: INR: 4

## 2011-05-10 LAB — COMPREHENSIVE METABOLIC PANEL
AST: 20
Albumin: 3.6
Alkaline Phosphatase: 86
BUN: 21
Chloride: 109
Potassium: 5
Total Bilirubin: 0.7

## 2011-05-10 LAB — CBC
HCT: 30.8 — ABNORMAL LOW
Hemoglobin: 8.9 — ABNORMAL LOW
Hemoglobin: 9.5 — ABNORMAL LOW
MCV: 83.5
Platelets: 233
RBC: 3.29 — ABNORMAL LOW
RBC: 3.47 — ABNORMAL LOW
WBC: 10.1
WBC: 8.9
WBC: 7.4

## 2011-05-10 LAB — BASIC METABOLIC PANEL
CO2: 29
Calcium: 8.4
Chloride: 104
Chloride: 104
Creatinine, Ser: 1.13
GFR calc Af Amer: >60
GFR calc non Af Amer: >60
Glucose, Bld: 122 — ABNORMAL HIGH
Potassium: 4.3
Sodium: 137
Sodium: 139

## 2011-05-10 LAB — POCT I-STAT 7, (LYTES, BLD GAS, ICA,H+H)
Calcium, Ion: 1.21
HCT: 29 — ABNORMAL LOW
Operator id: 230421
Patient temperature: 35.3
pCO2 arterial: 47.7 — ABNORMAL HIGH
pH, Arterial: 7.338 — ABNORMAL LOW

## 2011-05-10 LAB — BLOOD GAS, ARTERIAL
Bicarbonate: 27 — ABNORMAL HIGH
Bicarbonate: 22.8
O2 Content: 2
TCO2: 23.9
pCO2 arterial: 44.2
pCO2 arterial: 36.1
pH, Arterial: 7.416
pO2, Arterial: 78.2 — ABNORMAL LOW
pO2, Arterial: 111 — ABNORMAL HIGH

## 2011-05-10 LAB — URINALYSIS, ROUTINE W REFLEX MICROSCOPIC
Bilirubin Urine: NEGATIVE
Glucose, UA: NEGATIVE
Hgb urine dipstick: NEGATIVE
Specific Gravity, Urine: 1.013
pH: 5.5

## 2011-05-10 LAB — TYPE AND SCREEN: Antibody Screen: NEGATIVE

## 2011-05-10 LAB — PROTIME-INR: INR: 1.4

## 2011-05-14 LAB — URINALYSIS, ROUTINE W REFLEX MICROSCOPIC
Hgb urine dipstick: NEGATIVE
Nitrite: NEGATIVE
Specific Gravity, Urine: 1.01
Urobilinogen, UA: 0.2

## 2011-05-14 LAB — HEPATIC FUNCTION PANEL
ALT: 14
Indirect Bilirubin: 1.2 — ABNORMAL HIGH
Total Protein: 6.5

## 2011-05-14 LAB — CBC
HCT: 27.9 — ABNORMAL LOW
HCT: 34.8 — ABNORMAL LOW
Hemoglobin: 9.1 — ABNORMAL LOW
MCV: 83.7
Platelets: 190
Platelets: 208
Platelets: 220
Platelets: 233
RBC: 3.34 — ABNORMAL LOW
RDW: 15.6 — ABNORMAL HIGH
RDW: 15.6 — ABNORMAL HIGH
WBC: 5.1
WBC: 7.6

## 2011-05-14 LAB — DIFFERENTIAL
Basophils Absolute: 0
Basophils Relative: 0
Eosinophils Relative: 1
Lymphocytes Relative: 7 — ABNORMAL LOW
Neutro Abs: 14.2 — ABNORMAL HIGH

## 2011-05-14 LAB — I-STAT 8, (EC8 V) (CONVERTED LAB)
Bicarbonate: 25.5 — ABNORMAL HIGH
Glucose, Bld: 113 — ABNORMAL HIGH
HCT: 38 — ABNORMAL LOW
Potassium: 3.9
TCO2: 27
pCO2, Ven: 43.2 — ABNORMAL LOW
pH, Ven: 7.379 — ABNORMAL HIGH

## 2011-05-14 LAB — POCT I-STAT CREATININE
Creatinine, Ser: 1.3
Operator id: 151321

## 2011-05-14 LAB — PROTIME-INR
INR: 2.9 — ABNORMAL HIGH
INR: 5 — ABNORMAL HIGH
Prothrombin Time: 36.4 — ABNORMAL HIGH
Prothrombin Time: 38.4 — ABNORMAL HIGH

## 2011-05-14 LAB — BASIC METABOLIC PANEL
BUN: 1 — ABNORMAL LOW
Calcium: 8.5
Chloride: 107
GFR calc Af Amer: >60
GFR calc non Af Amer: >60
GFR calc non Af Amer: >60
Glucose, Bld: 94
Potassium: 4.2
Sodium: 138
Sodium: 141

## 2011-05-14 LAB — TYPE AND SCREEN: Antibody Screen: NEGATIVE

## 2011-05-14 LAB — TSH: TSH: 0.015 — ABNORMAL LOW

## 2011-05-14 LAB — ABO/RH: ABO/RH(D): A POS

## 2011-05-22 ENCOUNTER — Other Ambulatory Visit: Payer: Self-pay | Admitting: Neurological Surgery

## 2011-05-22 ENCOUNTER — Ambulatory Visit
Admission: RE | Admit: 2011-05-22 | Discharge: 2011-05-22 | Disposition: A | Discharge status: Home / Self Care | Payer: Medicare Other | Source: Ambulatory Visit | Attending: Neurological Surgery | Admitting: Neurological Surgery

## 2011-05-22 DIAGNOSIS — S22009A Unspecified fracture of unspecified thoracic vertebra, initial encounter for closed fracture: Secondary | ICD-10-CM

## 2011-05-24 ENCOUNTER — Telehealth: Payer: Self-pay

## 2011-05-24 ENCOUNTER — Ambulatory Visit (INDEPENDENT_AMBULATORY_CARE_PROVIDER_SITE_OTHER): Payer: Medicare Other | Admitting: *Deleted

## 2011-05-24 DIAGNOSIS — Z7901 Long term (current) use of anticoagulants: Secondary | ICD-10-CM

## 2011-05-24 DIAGNOSIS — I059 Rheumatic mitral valve disease, unspecified: Secondary | ICD-10-CM

## 2011-05-24 DIAGNOSIS — I4891 Unspecified atrial fibrillation: Secondary | ICD-10-CM

## 2011-05-24 DIAGNOSIS — Z952 Presence of prosthetic heart valve: Secondary | ICD-10-CM

## 2011-05-24 LAB — POCT INR: INR: 7.4

## 2011-05-24 LAB — PROTIME-INR: Prothrombin Time: 78.7 s (ref 10.2–12.4)

## 2011-05-24 NOTE — Telephone Encounter (Signed)
Result addressed, see anticoagulation clinic note in chart.

## 2011-05-24 NOTE — Telephone Encounter (Signed)
PT today was 78.7, INR= 7.04.

## 2011-05-31 ENCOUNTER — Ambulatory Visit (INDEPENDENT_AMBULATORY_CARE_PROVIDER_SITE_OTHER): Payer: Medicare Other | Admitting: *Deleted

## 2011-05-31 DIAGNOSIS — I059 Rheumatic mitral valve disease, unspecified: Secondary | ICD-10-CM

## 2011-05-31 DIAGNOSIS — I4891 Unspecified atrial fibrillation: Secondary | ICD-10-CM

## 2011-05-31 DIAGNOSIS — Z7901 Long term (current) use of anticoagulants: Secondary | ICD-10-CM

## 2011-05-31 DIAGNOSIS — Z954 Presence of other heart-valve replacement: Secondary | ICD-10-CM

## 2011-05-31 DIAGNOSIS — Z952 Presence of prosthetic heart valve: Secondary | ICD-10-CM

## 2011-06-02 ENCOUNTER — Other Ambulatory Visit: Payer: Self-pay | Admitting: Cardiology

## 2011-06-14 ENCOUNTER — Ambulatory Visit (INDEPENDENT_AMBULATORY_CARE_PROVIDER_SITE_OTHER): Payer: Medicare Other | Admitting: *Deleted

## 2011-06-14 DIAGNOSIS — I059 Rheumatic mitral valve disease, unspecified: Secondary | ICD-10-CM

## 2011-06-14 DIAGNOSIS — Z952 Presence of prosthetic heart valve: Secondary | ICD-10-CM

## 2011-06-14 DIAGNOSIS — Z7901 Long term (current) use of anticoagulants: Secondary | ICD-10-CM

## 2011-06-14 DIAGNOSIS — I4891 Unspecified atrial fibrillation: Secondary | ICD-10-CM

## 2011-06-25 ENCOUNTER — Ambulatory Visit (INDEPENDENT_AMBULATORY_CARE_PROVIDER_SITE_OTHER): Payer: Medicare Other | Admitting: *Deleted

## 2011-06-25 DIAGNOSIS — I4891 Unspecified atrial fibrillation: Secondary | ICD-10-CM

## 2011-06-25 DIAGNOSIS — Z7901 Long term (current) use of anticoagulants: Secondary | ICD-10-CM

## 2011-06-25 DIAGNOSIS — I059 Rheumatic mitral valve disease, unspecified: Secondary | ICD-10-CM

## 2011-06-25 DIAGNOSIS — Z952 Presence of prosthetic heart valve: Secondary | ICD-10-CM

## 2011-06-29 ENCOUNTER — Other Ambulatory Visit: Payer: Self-pay | Admitting: Cardiology

## 2011-07-09 ENCOUNTER — Ambulatory Visit (INDEPENDENT_AMBULATORY_CARE_PROVIDER_SITE_OTHER): Payer: Medicare Other | Admitting: *Deleted

## 2011-07-09 DIAGNOSIS — Z952 Presence of prosthetic heart valve: Secondary | ICD-10-CM

## 2011-07-09 DIAGNOSIS — I059 Rheumatic mitral valve disease, unspecified: Secondary | ICD-10-CM

## 2011-07-09 DIAGNOSIS — Z7901 Long term (current) use of anticoagulants: Secondary | ICD-10-CM

## 2011-07-09 DIAGNOSIS — I4891 Unspecified atrial fibrillation: Secondary | ICD-10-CM

## 2011-07-27 ENCOUNTER — Ambulatory Visit (INDEPENDENT_AMBULATORY_CARE_PROVIDER_SITE_OTHER): Payer: Medicare Other | Admitting: *Deleted

## 2011-07-27 DIAGNOSIS — Z954 Presence of other heart-valve replacement: Secondary | ICD-10-CM

## 2011-07-27 DIAGNOSIS — I059 Rheumatic mitral valve disease, unspecified: Secondary | ICD-10-CM

## 2011-07-27 DIAGNOSIS — I4891 Unspecified atrial fibrillation: Secondary | ICD-10-CM

## 2011-07-27 DIAGNOSIS — Z7901 Long term (current) use of anticoagulants: Secondary | ICD-10-CM

## 2011-07-27 DIAGNOSIS — Z952 Presence of prosthetic heart valve: Secondary | ICD-10-CM

## 2011-08-06 ENCOUNTER — Other Ambulatory Visit: Payer: Self-pay | Admitting: Cardiology

## 2011-08-13 ENCOUNTER — Other Ambulatory Visit: Payer: Self-pay

## 2011-08-13 MED ORDER — RAMIPRIL 5 MG PO CAPS: 5.0000 mg | ORAL_CAPSULE | Freq: Every day | ORAL | Status: DC

## 2011-08-21 ENCOUNTER — Encounter: Payer: Self-pay | Admitting: Cardiology

## 2011-08-21 ENCOUNTER — Ambulatory Visit (INDEPENDENT_AMBULATORY_CARE_PROVIDER_SITE_OTHER): Payer: Medicare Other | Admitting: Cardiology

## 2011-08-21 ENCOUNTER — Ambulatory Visit (INDEPENDENT_AMBULATORY_CARE_PROVIDER_SITE_OTHER): Payer: Medicare Other | Admitting: *Deleted

## 2011-08-21 VITALS — BP 132/78 | HR 69 | Ht 71.0 in | Wt 181.0 lb

## 2011-08-21 DIAGNOSIS — Z952 Presence of prosthetic heart valve: Secondary | ICD-10-CM

## 2011-08-21 DIAGNOSIS — Z7901 Long term (current) use of anticoagulants: Secondary | ICD-10-CM

## 2011-08-21 DIAGNOSIS — I4891 Unspecified atrial fibrillation: Secondary | ICD-10-CM

## 2011-08-21 DIAGNOSIS — Z954 Presence of other heart-valve replacement: Secondary | ICD-10-CM

## 2011-08-21 DIAGNOSIS — S064XAA Epidural hemorrhage with loss of consciousness status unknown, initial encounter: Secondary | ICD-10-CM

## 2011-08-21 DIAGNOSIS — Z8679 Personal history of other diseases of the circulatory system: Secondary | ICD-10-CM

## 2011-08-21 DIAGNOSIS — I059 Rheumatic mitral valve disease, unspecified: Secondary | ICD-10-CM

## 2011-08-21 DIAGNOSIS — I621 Nontraumatic extradural hemorrhage: Secondary | ICD-10-CM

## 2011-08-21 DIAGNOSIS — S064X9A Epidural hemorrhage with loss of consciousness of unspecified duration, initial encounter: Secondary | ICD-10-CM

## 2011-08-21 LAB — BASIC METABOLIC PANEL
CO2: 27 mEq/L (ref 19–32)
Chloride: 106 mEq/L (ref 96–112)
Sodium: 140 mEq/L (ref 135–145)

## 2011-08-21 LAB — POCT INR: INR: 1.2

## 2011-08-21 NOTE — Assessment & Plan Note (Signed)
Controlled at present.  On chronic anticoagulation.

## 2011-08-21 NOTE — Progress Notes (Signed)
HPI:  He is stable at the present time.  Back hurts a bit, but otherwise ok.  Followed in coumadin clinic.   Current Outpatient Prescriptions  Medication Sig Dispense Refill  . Cholecalciferol (VITAMIN D3) 1000 UNITS CAPS Take 1 capsule by mouth.        . ezetimibe (ZETIA) 10 MG tablet Take 10 mg by mouth daily.        . furosemide (LASIX) 20 MG tablet TAKE 1 TABLET BY MOUTH EVERY DAY FOR FLUID  30 tablet  6  . hydrocodone-acetaminophen (LORCET-HD) 5-500 MG per capsule Take 1 capsule by mouth every 6 (six) hours as needed.        Marland Kitchen KLOR-CON M20 20 MEQ tablet TAKE 1 TABLET BY MOUTH EVERY DAY  30 tablet  6  . metoprolol tartrate (LOPRESSOR) 25 MG tablet TAKE 1 TABLET BY MOUTH TWICE A DAY  64 tablet  9  . omeprazole (PRILOSEC) 20 MG capsule Take 20 mg by mouth daily.       Marland Kitchen tiotropium (SPIRIVA) 18 MCG inhalation capsule Place 18 mcg into inhaler and inhale daily.      Marland Kitchen warfarin (COUMADIN) 5 MG tablet USE AS DIRECTED BY ANTICOAGULATION CLINIC  40 tablet  2  . zolpidem (AMBIEN) 5 MG tablet Take 5 mg by mouth at bedtime as needed.        . ramipril (ALTACE) 5 MG capsule Take 1 capsule (5 mg total) by mouth daily.  30 capsule  3  . warfarin (COUMADIN) 5 MG tablet Take as directed by Coumadin Clinic         Allergies  Allergen Reactions  . Rosuvastatin     Past Medical History  Diagnosis Date  . Iron deficiency anemia, unspecified   . Other diseases of lung, not elsewhere classified   . Other primary cardiomyopathies   . Personal history of other diseases of digestive system   . Personal history of surgery to heart and great vessels, presenting hazards to health   . Acute and subacute bacterial endocarditis   . Arrhythmia   . Cerebrovascular disease, unspecified   . Esophageal reflux   . Generalized osteoarthrosis, unspecified site   . Hypertension   . COPD (chronic obstructive pulmonary disease) 1999    Past Surgical History  Procedure Date  . Mitral valve replacement     for  bacterial endocarditis using bi-prosthetic tissue valve in 1999  . Video assisted thoracoscopy     left-and miniature muscle sparing thoracotomy for left lower lobectomy 04/29/2007  dr. Cornelius Moras    No family history on file.  History   Social History  . Marital Status: Divorced    Spouse Name: N/A    Number of Children: N/A  . Years of Education: N/A   Occupational History  . Not on file.   Social History Main Topics  . Smoking status: Former Smoker -- 2.0 packs/day for 40 years    Types: Cigarettes    Quit date: 07/30/2004  . Smokeless tobacco: Never Used  . Alcohol Use: Yes  . Drug Use: Not on file  . Sexually Active: Not on file   Other Topics Concern  . Not on file   Social History Narrative  . No narrative on file    ROS: Please see the HPI.  All other systems reviewed and negative.  PHYSICAL EXAM:  BP 132/78  Pulse 69  Ht 5\' 11"  (1.803 m)  Wt 82.101 kg (181 lb)  BMI 25.24 kg/m2  General:  Well developed, well nourished, in no acute distress. Head:  Normocephalic and atraumatic. Neck: no JVD Lungs:  Slight decrease in BS in left base.   Heart: irregularly irregular without significant murmur.  Abdomen:  Normal bowel sounds; soft; non tender; no organomegaly Pulses: Pulses normal in all 4 extremities. Extremities: No clubbing or cyanosis. No edema. Neurologic: Alert and oriented x 3.  EKG:  Atrial fibrillation with controlled ventricular response.    ASSESSMENT AND PLAN:

## 2011-08-21 NOTE — Assessment & Plan Note (Signed)
Seems to have stabilized.

## 2011-08-21 NOTE — Assessment & Plan Note (Signed)
Stable.  Will get echo in follow up.

## 2011-08-21 NOTE — Patient Instructions (Signed)
Your physician recommends that you have lab work today: BMP  Your physician wants you to follow-up in: 3 MONTHS. You will receive a reminder letter in the mail two months in advance. If you don't receive a letter, please call our office to schedule the follow-up appointment.  Your physician recommends that you continue on your current medications as directed. Please refer to the Current Medication list given to you today.

## 2011-08-28 ENCOUNTER — Ambulatory Visit (INDEPENDENT_AMBULATORY_CARE_PROVIDER_SITE_OTHER): Payer: Medicare Other | Admitting: Critical Care Medicine

## 2011-08-28 ENCOUNTER — Encounter: Payer: Self-pay | Admitting: Critical Care Medicine

## 2011-08-28 VITALS — BP 134/90 | HR 78 | Temp 98.7°F | Ht 69.0 in | Wt 180.8 lb

## 2011-08-28 DIAGNOSIS — J449 Chronic obstructive pulmonary disease, unspecified: Secondary | ICD-10-CM

## 2011-08-28 MED ORDER — TIOTROPIUM BROMIDE MONOHYDRATE 18 MCG IN CAPS: 18.0000 ug | ORAL_CAPSULE | Freq: Every day | RESPIRATORY_TRACT | Status: DC

## 2011-08-28 NOTE — Progress Notes (Signed)
Subjective:    Patient ID: Jeffery Copeland, male    DOB: 1946/04/11, 65 y.o.   MRN: [id number]  HPI  67 y.o. WM Fell had fx of T9 vertebrae.  Epidural hematoma.  No surgery.  Hx Copd.  Not smoking Since d/c about the same.  Pt notes dyspnea with exertion.  No mucus.  No real chest pain.  Back is still hurting.    08/28/2011 Not seen since 8/12 Dyspnea is the same, ? If any worse.  Worse with exertion. No smoking since 2006  Past Medical History  Diagnosis Date  . Iron deficiency anemia, unspecified   . Other diseases of lung, not elsewhere classified   . Other primary cardiomyopathies   . Personal history of other diseases of digestive system   . Personal history of surgery to heart and great vessels, presenting hazards to health   . Acute and subacute bacterial endocarditis   . Arrhythmia   . Cerebrovascular disease, unspecified   . Esophageal reflux   . Generalized osteoarthrosis, unspecified site   . Hypertension   . COPD (chronic obstructive pulmonary disease) 1999     No family history on file.   History   Social History  . Marital Status: Divorced    Spouse Name: N/A    Number of Children: N/A  . Years of Education: N/A   Occupational History  . Not on file.   Social History Main Topics  . Smoking status: Former Smoker -- 2.0 packs/day for 40 years    Types: Cigarettes    Quit date: 07/30/2004  . Smokeless tobacco: Never Used  . Alcohol Use: Yes  . Drug Use: Not on file  . Sexually Active: Not on file   Other Topics Concern  . Not on file   Social History Narrative  . No narrative on file     Allergies  Allergen Reactions  . Rosuvastatin      Outpatient Prescriptions Prior to Visit  Medication Sig Dispense Refill  . Cholecalciferol (VITAMIN D3) 1000 UNITS CAPS Take 1 capsule by mouth.        . ezetimibe (ZETIA) 10 MG tablet Take 10 mg by mouth daily.        . furosemide (LASIX) 20 MG tablet TAKE 1 TABLET BY MOUTH EVERY DAY FOR FLUID  30 tablet  6   . hydrocodone-acetaminophen (LORCET-HD) 5-500 MG per capsule Take 1 capsule by mouth every 6 (six) hours as needed.        Marland Kitchen KLOR-CON M20 20 MEQ tablet TAKE 1 TABLET BY MOUTH EVERY DAY  30 tablet  6  . metoprolol tartrate (LOPRESSOR) 25 MG tablet TAKE 1 TABLET BY MOUTH TWICE A DAY  64 tablet  9  . omeprazole (PRILOSEC) 20 MG capsule Take 20 mg by mouth daily.       . ramipril (ALTACE) 5 MG capsule Take 1 capsule (5 mg total) by mouth daily.  30 capsule  3  . warfarin (COUMADIN) 5 MG tablet USE AS DIRECTED BY ANTICOAGULATION CLINIC  40 tablet  2  . zolpidem (AMBIEN) 5 MG tablet Take 5 mg by mouth at bedtime as needed.        . tiotropium (SPIRIVA) 18 MCG inhalation capsule Place 18 mcg into inhaler and inhale daily.         Review of Systems  Constitutional:   No  weight loss, night sweats,  Fevers, chills, fatigue, lassitude. HEENT:   No headaches,  Difficulty swallowing,  Tooth/dental problems,  Sore throat,                No sneezing, itching, ear ache, nasal congestion, post nasal drip,   CV:  No chest pain,  Orthopnea, PND, swelling in lower extremities, anasarca, dizziness, palpitations  GI  No heartburn, indigestion, abdominal pain, nausea, vomiting, diarrhea, change in bowel habits, loss of appetite  Resp: Notes shortness of breath with exertion not  at rest.  No excess mucus, no productive cough,  No non-productive cough,  No coughing up of blood.  No change in color of mucus.  No wheezing.  No chest wall deformity  Skin: no rash or lesions.  GU: no dysuria, change in color of urine, no urgency or frequency.  No flank pain.  MS:  No joint pain or swelling.  No decreased range of motion.  No back pain.  Psych:  No change in mood or affect. No depression or anxiety.  No memory loss.     Objective:   Physical Exam  Filed Vitals:   08/28/11 0937  BP: 134/90  Pulse: 78  Temp: 98.7 F (37.1 C)  TempSrc: Oral  Height: 5\' 9"  (1.753 m)  Weight: 180 lb 12.8 oz (82.01 kg)    SpO2: 97%    Gen: Pleasant, well-nourished, in no distress,  normal affect  ENT: No lesions,  mouth clear,  oropharynx clear, no postnasal drip  Neck: No JVD, no TMG, no carotid bruits  Lungs: No use of accessory muscles, no dullness to percussion, distant BS  Cardiovascular: RRR, heart sounds normal, no murmur or gallops, no peripheral edema  Abdomen: soft and NT, no HSM,  BS normal  Musculoskeletal: No deformities, no cyanosis or clubbing  Neuro: alert, non focal  Skin: Warm, no lesions or rashes         Assessment & Plan:   COPD Chronic obstructive lung disease golds stage II stable at this time Plan Maintain Spiriva daily Return in 6 months

## 2011-08-28 NOTE — Patient Instructions (Signed)
Stay on spiriva Return 6 months 

## 2011-08-28 NOTE — Assessment & Plan Note (Signed)
Chronic obstructive lung disease golds stage II stable at this time Plan Maintain Spiriva daily Return in 6 months

## 2011-08-31 ENCOUNTER — Ambulatory Visit (INDEPENDENT_AMBULATORY_CARE_PROVIDER_SITE_OTHER): Payer: Medicare Other

## 2011-08-31 DIAGNOSIS — Z952 Presence of prosthetic heart valve: Secondary | ICD-10-CM

## 2011-08-31 DIAGNOSIS — I059 Rheumatic mitral valve disease, unspecified: Secondary | ICD-10-CM

## 2011-08-31 DIAGNOSIS — I4891 Unspecified atrial fibrillation: Secondary | ICD-10-CM

## 2011-08-31 DIAGNOSIS — Z7901 Long term (current) use of anticoagulants: Secondary | ICD-10-CM

## 2011-08-31 DIAGNOSIS — Z954 Presence of other heart-valve replacement: Secondary | ICD-10-CM

## 2011-09-14 ENCOUNTER — Ambulatory Visit (INDEPENDENT_AMBULATORY_CARE_PROVIDER_SITE_OTHER): Payer: Medicare Other | Admitting: Pharmacist

## 2011-09-14 DIAGNOSIS — Z7901 Long term (current) use of anticoagulants: Secondary | ICD-10-CM

## 2011-09-14 DIAGNOSIS — I4891 Unspecified atrial fibrillation: Secondary | ICD-10-CM

## 2011-09-14 DIAGNOSIS — Z952 Presence of prosthetic heart valve: Secondary | ICD-10-CM

## 2011-09-14 DIAGNOSIS — I059 Rheumatic mitral valve disease, unspecified: Secondary | ICD-10-CM

## 2011-09-14 DIAGNOSIS — Z954 Presence of other heart-valve replacement: Secondary | ICD-10-CM

## 2011-09-14 LAB — POCT INR: INR: 2.4

## 2011-09-28 ENCOUNTER — Ambulatory Visit (INDEPENDENT_AMBULATORY_CARE_PROVIDER_SITE_OTHER): Payer: Medicare Other | Admitting: *Deleted

## 2011-09-28 DIAGNOSIS — Z7901 Long term (current) use of anticoagulants: Secondary | ICD-10-CM

## 2011-09-28 DIAGNOSIS — Z954 Presence of other heart-valve replacement: Secondary | ICD-10-CM

## 2011-09-28 DIAGNOSIS — Z952 Presence of prosthetic heart valve: Secondary | ICD-10-CM

## 2011-09-28 DIAGNOSIS — I059 Rheumatic mitral valve disease, unspecified: Secondary | ICD-10-CM

## 2011-09-28 DIAGNOSIS — I4891 Unspecified atrial fibrillation: Secondary | ICD-10-CM

## 2011-09-28 LAB — POCT INR: INR: 2.2

## 2011-10-01 ENCOUNTER — Other Ambulatory Visit: Payer: Self-pay | Admitting: Cardiology

## 2011-10-12 ENCOUNTER — Ambulatory Visit (INDEPENDENT_AMBULATORY_CARE_PROVIDER_SITE_OTHER): Payer: Medicare Other | Admitting: *Deleted

## 2011-10-12 DIAGNOSIS — Z7901 Long term (current) use of anticoagulants: Secondary | ICD-10-CM

## 2011-10-12 DIAGNOSIS — Z952 Presence of prosthetic heart valve: Secondary | ICD-10-CM

## 2011-10-12 DIAGNOSIS — I4891 Unspecified atrial fibrillation: Secondary | ICD-10-CM

## 2011-10-12 DIAGNOSIS — Z954 Presence of other heart-valve replacement: Secondary | ICD-10-CM

## 2011-10-12 DIAGNOSIS — I059 Rheumatic mitral valve disease, unspecified: Secondary | ICD-10-CM

## 2011-10-29 ENCOUNTER — Ambulatory Visit (INDEPENDENT_AMBULATORY_CARE_PROVIDER_SITE_OTHER): Payer: Medicare Other | Admitting: *Deleted

## 2011-10-29 DIAGNOSIS — I059 Rheumatic mitral valve disease, unspecified: Secondary | ICD-10-CM

## 2011-10-29 DIAGNOSIS — I4891 Unspecified atrial fibrillation: Secondary | ICD-10-CM

## 2011-10-29 DIAGNOSIS — Z7901 Long term (current) use of anticoagulants: Secondary | ICD-10-CM

## 2011-10-29 DIAGNOSIS — Z954 Presence of other heart-valve replacement: Secondary | ICD-10-CM

## 2011-10-29 DIAGNOSIS — Z952 Presence of prosthetic heart valve: Secondary | ICD-10-CM

## 2011-10-29 LAB — POCT INR: INR: 4.5

## 2011-11-20 ENCOUNTER — Ambulatory Visit (INDEPENDENT_AMBULATORY_CARE_PROVIDER_SITE_OTHER): Payer: Medicare Other | Admitting: Cardiology

## 2011-11-20 ENCOUNTER — Encounter: Payer: Self-pay | Admitting: Cardiology

## 2011-11-20 ENCOUNTER — Ambulatory Visit (INDEPENDENT_AMBULATORY_CARE_PROVIDER_SITE_OTHER): Payer: Medicare Other | Admitting: *Deleted

## 2011-11-20 VITALS — BP 130/84 | HR 73 | Ht 68.0 in | Wt 180.0 lb

## 2011-11-20 DIAGNOSIS — Z952 Presence of prosthetic heart valve: Secondary | ICD-10-CM

## 2011-11-20 DIAGNOSIS — Z7901 Long term (current) use of anticoagulants: Secondary | ICD-10-CM

## 2011-11-20 DIAGNOSIS — I4891 Unspecified atrial fibrillation: Secondary | ICD-10-CM

## 2011-11-20 DIAGNOSIS — Z954 Presence of other heart-valve replacement: Secondary | ICD-10-CM

## 2011-11-20 DIAGNOSIS — I059 Rheumatic mitral valve disease, unspecified: Secondary | ICD-10-CM

## 2011-11-20 DIAGNOSIS — Z8679 Personal history of other diseases of the circulatory system: Secondary | ICD-10-CM

## 2011-11-20 LAB — POCT INR: INR: 2.4

## 2011-11-20 NOTE — Progress Notes (Signed)
HPI:  He is doing ok.  I encouraged him to be careful.  No chest pain.  No real exertional dyspnea, but he is not all that active.    Current Outpatient Prescriptions  Medication Sig Dispense Refill  . Cholecalciferol (VITAMIN D3) 1000 UNITS CAPS Take 1 capsule by mouth.        . ezetimibe (ZETIA) 10 MG tablet Take 10 mg by mouth daily.        . furosemide (LASIX) 20 MG tablet TAKE 1 TABLET BY MOUTH EVERY DAY FOR FLUID  30 tablet  6  . HYDROcodone-acetaminophen (LORTAB) 10-500 MG per tablet Take 1 tablet by mouth every 6 (six) hours as needed.      Marland Kitchen KLOR-CON M20 20 MEQ tablet TAKE 1 TABLET BY MOUTH EVERY DAY  30 tablet  6  . metoprolol tartrate (LOPRESSOR) 25 MG tablet TAKE 1 TABLET BY MOUTH TWICE A DAY  64 tablet  9  . omeprazole (PRILOSEC) 20 MG capsule Take 20 mg by mouth daily.       . ramipril (ALTACE) 5 MG capsule Take 1 capsule (5 mg total) by mouth daily.  30 capsule  3  . tiotropium (SPIRIVA) 18 MCG inhalation capsule Place 1 capsule (18 mcg total) into inhaler and inhale daily.  30 capsule  11  . warfarin (COUMADIN) 5 MG tablet USE AS DIRECTED BY ANTICOAGULATION CLINIC  40 tablet  3  . zolpidem (AMBIEN) 5 MG tablet Take 5 mg by mouth at bedtime as needed.          Allergies  Allergen Reactions  . Rosuvastatin     Past Medical History  Diagnosis Date  . Iron deficiency anemia, unspecified   . Other diseases of lung, not elsewhere classified   . Other primary cardiomyopathies   . Personal history of other diseases of digestive system   . Personal history of surgery to heart and great vessels, presenting hazards to health   . Acute and subacute bacterial endocarditis   . Arrhythmia   . Cerebrovascular disease, unspecified   . Esophageal reflux   . Generalized osteoarthrosis, unspecified site   . Hypertension   . COPD (chronic obstructive pulmonary disease) 1999    Past Surgical History  Procedure Date  . 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 Video assisted thoracoscopy     left-and miniature muscle sparing thoracotomy for left lower lobectomy 04/29/2007  dr. Cornelius Moras    No family history on file.  History   Social History  . Marital Status: Divorced    Spouse Name: N/A    Number of Children: N/A  . Years of Education: N/A   Occupational History  . Not on file.   Social History Main Topics  . Smoking status: Former Smoker -- 2.0 packs/day for 40 years    Types: Cigarettes    Quit date: 07/30/2004  . Smokeless tobacco: Never Used  . Alcohol Use: Yes  . Drug Use: Not on file  . Sexually Active: Not on file   Other Topics Concern  . Not on file   Social History Narrative  . No narrative on file    ROS: Please see the HPI.  All other systems reviewed and negative.  PHYSICAL EXAM:  BP 130/84  Pulse 73  Ht 5\' 8"  (1.727 m)  Wt 180 lb (81.647 kg)  BMI 27.37 kg/m2  General: Well developed, well nourished, in no acute distress.  Head:  Normocephalic and atraumatic. Neck: no JVD Lungs: Clear to auscultation and percussion. Heart: irregularly, irregular rhythm.  Holosystolic murmur, new, in left lateral area, radiating toward the axilla Abdomen:  Normal bowel sounds; soft; non tender; no organomegaly Pulses: Pulses normal in all 4 extremities. Extremities: No clubbing or cyanosis. No edema. Neurologic: Alert and oriented x 3.  EKG:  Atrial fib.  Controlled ventricular response.    ASSESSMENT AND PLAN:

## 2011-11-20 NOTE — Patient Instructions (Signed)
Your physician has requested that you have an echocardiogram. Echocardiography is a painless test that uses sound waves to create images of your heart. It provides your doctor with information about the size and shape of your heart and how well your heart's chambers and valves are working. This procedure takes approximately one hour. There are no restrictions for this procedure.  Your physician recommends that you schedule a follow-up appointment in: 2 WEEKS  Your physician recommends that you continue on your current medications as directed. Please refer to the Current Medication list given to you today.

## 2011-11-20 NOTE — Assessment & Plan Note (Addendum)
Rate is controlled nicely.  Will continue current regimen.  Labs have been done by Dr. Renae Gloss and the patient told they are ok.

## 2011-11-20 NOTE — Assessment & Plan Note (Signed)
Patient has mitral valve replacement with a tissue valve.  He has a prominent murmur now radiating to the axilla.  He has a tissue valve that dates back to 1998.  Will recheck echo, last done in 2012.  No MR mentioned.  Will recheck and have him return in two weeks.

## 2011-12-03 ENCOUNTER — Other Ambulatory Visit: Payer: Self-pay | Admitting: Cardiology

## 2011-12-04 ENCOUNTER — Ambulatory Visit (INDEPENDENT_AMBULATORY_CARE_PROVIDER_SITE_OTHER): Payer: Medicare Other | Admitting: Pharmacist

## 2011-12-04 ENCOUNTER — Encounter: Payer: Self-pay | Admitting: Cardiology

## 2011-12-04 ENCOUNTER — Other Ambulatory Visit: Payer: Self-pay

## 2011-12-04 ENCOUNTER — Ambulatory Visit (HOSPITAL_COMMUNITY): Payer: Medicare Other | Attending: Cardiology

## 2011-12-04 ENCOUNTER — Ambulatory Visit (INDEPENDENT_AMBULATORY_CARE_PROVIDER_SITE_OTHER): Payer: Medicare Other | Admitting: Cardiology

## 2011-12-04 VITALS — BP 152/84 | HR 61 | Ht 69.0 in | Wt 182.0 lb

## 2011-12-04 DIAGNOSIS — Z954 Presence of other heart-valve replacement: Secondary | ICD-10-CM | POA: Insufficient documentation

## 2011-12-04 DIAGNOSIS — I10 Essential (primary) hypertension: Secondary | ICD-10-CM

## 2011-12-04 DIAGNOSIS — Z7901 Long term (current) use of anticoagulants: Secondary | ICD-10-CM

## 2011-12-04 DIAGNOSIS — Z8679 Personal history of other diseases of the circulatory system: Secondary | ICD-10-CM

## 2011-12-04 DIAGNOSIS — Z952 Presence of prosthetic heart valve: Secondary | ICD-10-CM

## 2011-12-04 DIAGNOSIS — I4891 Unspecified atrial fibrillation: Secondary | ICD-10-CM | POA: Insufficient documentation

## 2011-12-04 DIAGNOSIS — J449 Chronic obstructive pulmonary disease, unspecified: Secondary | ICD-10-CM | POA: Insufficient documentation

## 2011-12-04 DIAGNOSIS — I38 Endocarditis, valve unspecified: Secondary | ICD-10-CM | POA: Insufficient documentation

## 2011-12-04 DIAGNOSIS — I059 Rheumatic mitral valve disease, unspecified: Secondary | ICD-10-CM

## 2011-12-04 DIAGNOSIS — J4489 Other specified chronic obstructive pulmonary disease: Secondary | ICD-10-CM | POA: Insufficient documentation

## 2011-12-04 DIAGNOSIS — I359 Nonrheumatic aortic valve disorder, unspecified: Secondary | ICD-10-CM | POA: Insufficient documentation

## 2011-12-04 NOTE — Assessment & Plan Note (Signed)
Stable rate.  Remains on chronic warfarin anticoagulation.

## 2011-12-04 NOTE — Progress Notes (Signed)
HPI:  The patient returns for follow up.  Overall he is stable.  He underwent echocardiography today in the office, and the final report is pending.  I have reviewed briefly.  He does note that he gets moderately winded with exertion.  Denies any chest pain.    Current Outpatient Prescriptions  Medication Sig Dispense Refill  . Cholecalciferol (VITAMIN D3) 1000 UNITS CAPS Take 1 capsule by mouth.        . ezetimibe (ZETIA) 10 MG tablet Take 10 mg by mouth daily.        . furosemide (LASIX) 20 MG tablet TAKE 1 TABLET BY MOUTH EVERY DAY FOR FLUID  30 tablet  6  . HYDROcodone-acetaminophen (LORTAB) 10-500 MG per tablet Take 1 tablet by mouth every 6 (six) hours as needed.      Marland Kitchen KLOR-CON M20 20 MEQ tablet TAKE 1 TABLET BY MOUTH EVERY DAY  30 tablet  6  . metoprolol tartrate (LOPRESSOR) 25 MG tablet TAKE 1 TABLET BY MOUTH TWICE A DAY  64 tablet  9  . omeprazole (PRILOSEC) 20 MG capsule Take 20 mg by mouth daily.       . ramipril (ALTACE) 5 MG capsule TAKE ONE CAPSULE BY MOUTH EVERY DAY  30 capsule  3  . tiotropium (SPIRIVA) 18 MCG inhalation capsule Place 1 capsule (18 mcg total) into inhaler and inhale daily.  30 capsule  11  . warfarin (COUMADIN) 5 MG tablet USE AS DIRECTED BY ANTICOAGULATION CLINIC  40 tablet  3  . zolpidem (AMBIEN) 5 MG tablet Take 5 mg by mouth at bedtime as needed.          Allergies  Allergen Reactions  . Rosuvastatin     Past Medical History  Diagnosis Date  . Iron deficiency anemia, unspecified   . Other diseases of lung, not elsewhere classified   . Other primary cardiomyopathies   . Personal history of other diseases of digestive system   . Personal history of surgery to heart and great vessels, presenting hazards to health   . Acute and subacute bacterial endocarditis   . Arrhythmia   . Cerebrovascular disease, unspecified   . Esophageal reflux   . Generalized osteoarthrosis, unspecified site   . Hypertension   . COPD (chronic obstructive pulmonary  disease) 1999    Past Surgical History  Procedure Date  . 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 Video assisted thoracoscopy     left-and miniature muscle sparing thoracotomy for left lower lobectomy 04/29/2007  dr. Cornelius Moras    No family history on file.  History   Social History  . Marital Status: Divorced    Spouse Name: N/A    Number of Children: N/A  . Years of Education: N/A   Occupational History  . Not on file.   Social History Main Topics  . Smoking status: Former Smoker -- 2.0 packs/day for 40 years    Types: Cigarettes    Quit date: 07/30/2004  . Smokeless tobacco: Never Used  . Alcohol Use: Yes  . Drug Use: Not on file  . Sexually Active: Not on file   Other Topics Concern  . Not on file   Social History Narrative  . No narrative on file    ROS: Please see the HPI.  All other systems reviewed and negative.  PHYSICAL EXAM:  BP 152/84  Pulse 61  Ht 5\' 9"  (1.753 m)  Wt 182  lb (82.555 kg)  BMI 26.88 kg/m2  General: Well developed, well nourished, in no acute distress. Head:  Normocephalic and atraumatic. Neck: no JVD Lungs: decrease BS to the left bases.   Heart: Normal S1 and S2. Prominent apical murmur.   Abdomen:  Normal bowel sounds; soft; non tender; no organomegaly Pulses: Pulses normal in all 4 extremities. Extremities: No clubbing or cyanosis. No edema. Neurologic: Alert and oriented x 3.  EKG:  Atrial fibrillation, controlled ventricular response.   ASSESSMENT AND PLAN:

## 2011-12-04 NOTE — Assessment & Plan Note (Signed)
Will reasses the results of echo.  He may need a TEE.

## 2011-12-04 NOTE — Assessment & Plan Note (Signed)
See other diagnosis.

## 2011-12-04 NOTE — Assessment & Plan Note (Signed)
He might benefit from greater BP control.  Consider increase in ACE to twice daily.

## 2011-12-04 NOTE — Patient Instructions (Signed)
Your physician recommends that you schedule a follow-up appointment in: 2 MONTHS with Dr Riley Kill  Your physician recommends that you continue on your current medications as directed. Please refer to the Current Medication list given to you today.  Your physician recommends that you continue on your current medications as directed. Please refer to the Current Medication list given to you today.

## 2011-12-18 ENCOUNTER — Ambulatory Visit (INDEPENDENT_AMBULATORY_CARE_PROVIDER_SITE_OTHER): Payer: Medicare Other | Admitting: Pharmacist

## 2011-12-18 DIAGNOSIS — Z954 Presence of other heart-valve replacement: Secondary | ICD-10-CM

## 2011-12-18 DIAGNOSIS — I059 Rheumatic mitral valve disease, unspecified: Secondary | ICD-10-CM

## 2011-12-18 DIAGNOSIS — Z7901 Long term (current) use of anticoagulants: Secondary | ICD-10-CM

## 2011-12-18 DIAGNOSIS — Z952 Presence of prosthetic heart valve: Secondary | ICD-10-CM

## 2011-12-18 DIAGNOSIS — I4891 Unspecified atrial fibrillation: Secondary | ICD-10-CM

## 2012-01-03 ENCOUNTER — Other Ambulatory Visit: Payer: Self-pay | Admitting: Cardiology

## 2012-01-08 ENCOUNTER — Ambulatory Visit (INDEPENDENT_AMBULATORY_CARE_PROVIDER_SITE_OTHER): Payer: Medicare Other

## 2012-01-08 DIAGNOSIS — I4891 Unspecified atrial fibrillation: Secondary | ICD-10-CM

## 2012-01-08 DIAGNOSIS — Z7901 Long term (current) use of anticoagulants: Secondary | ICD-10-CM

## 2012-01-08 DIAGNOSIS — Z952 Presence of prosthetic heart valve: Secondary | ICD-10-CM

## 2012-01-08 DIAGNOSIS — Z954 Presence of other heart-valve replacement: Secondary | ICD-10-CM

## 2012-01-08 DIAGNOSIS — I059 Rheumatic mitral valve disease, unspecified: Secondary | ICD-10-CM

## 2012-02-01 ENCOUNTER — Other Ambulatory Visit: Payer: Self-pay | Admitting: *Deleted

## 2012-02-01 DIAGNOSIS — I6529 Occlusion and stenosis of unspecified carotid artery: Secondary | ICD-10-CM

## 2012-02-05 ENCOUNTER — Ambulatory Visit (INDEPENDENT_AMBULATORY_CARE_PROVIDER_SITE_OTHER): Payer: Medicare Other | Admitting: *Deleted

## 2012-02-05 ENCOUNTER — Ambulatory Visit (INDEPENDENT_AMBULATORY_CARE_PROVIDER_SITE_OTHER): Payer: Medicare Other | Admitting: Cardiology

## 2012-02-05 ENCOUNTER — Encounter: Payer: Self-pay | Admitting: Cardiology

## 2012-02-05 ENCOUNTER — Encounter (INDEPENDENT_AMBULATORY_CARE_PROVIDER_SITE_OTHER): Payer: Medicare Other

## 2012-02-05 VITALS — BP 128/76 | HR 55 | Ht 69.0 in | Wt 182.0 lb

## 2012-02-05 DIAGNOSIS — Z954 Presence of other heart-valve replacement: Secondary | ICD-10-CM

## 2012-02-05 DIAGNOSIS — Z952 Presence of prosthetic heart valve: Secondary | ICD-10-CM

## 2012-02-05 DIAGNOSIS — Z7901 Long term (current) use of anticoagulants: Secondary | ICD-10-CM

## 2012-02-05 DIAGNOSIS — I6529 Occlusion and stenosis of unspecified carotid artery: Secondary | ICD-10-CM

## 2012-02-05 DIAGNOSIS — I4891 Unspecified atrial fibrillation: Secondary | ICD-10-CM

## 2012-02-05 DIAGNOSIS — I059 Rheumatic mitral valve disease, unspecified: Secondary | ICD-10-CM

## 2012-02-05 DIAGNOSIS — D509 Iron deficiency anemia, unspecified: Secondary | ICD-10-CM

## 2012-02-05 DIAGNOSIS — Z9889 Other specified postprocedural states: Secondary | ICD-10-CM

## 2012-02-05 LAB — BASIC METABOLIC PANEL
BUN: 19 mg/dL (ref 6–23)
CO2: 30 mEq/L (ref 19–32)
Calcium: 9.3 mg/dL (ref 8.4–10.5)
Glucose, Bld: 102 mg/dL — ABNORMAL HIGH (ref 70–99)
Sodium: 138 mEq/L (ref 135–145)

## 2012-02-05 LAB — CBC WITH DIFFERENTIAL/PLATELET
Basophils Absolute: 0 10*3/uL (ref 0.0–0.1)
Eosinophils Absolute: 0.1 10*3/uL (ref 0.0–0.7)
Hemoglobin: 12.7 g/dL — ABNORMAL LOW (ref 13.0–17.0)
Lymphocytes Relative: 13.8 % (ref 12.0–46.0)
Lymphs Abs: 0.8 10*3/uL (ref 0.7–4.0)
MCHC: 32.7 g/dL (ref 30.0–36.0)
Neutro Abs: 4 10*3/uL (ref 1.4–7.7)
RDW: 14.9 % — ABNORMAL HIGH (ref 11.5–14.6)

## 2012-02-05 NOTE — Progress Notes (Signed)
HPI:  The patient is stable overall. He has not been having any chest pain or increasing shortness of breath. He does have mild shortness of breath at rest. His carotids done today, we went over some of the information.  Current Outpatient Prescriptions  Medication Sig Dispense Refill  . Cholecalciferol (VITAMIN D3) 1000 UNITS CAPS Take 1 capsule by mouth.        . ezetimibe (ZETIA) 10 MG tablet Take 10 mg by mouth daily.        . furosemide (LASIX) 20 MG tablet TAKE 1 TABLET BY MOUTH EVERY DAY FOR FLUID  30 tablet  6  . HYDROcodone-acetaminophen (LORTAB) 10-500 MG per tablet Take 1 tablet by mouth every 6 (six) hours as needed.      Marland Kitchen KLOR-CON M20 20 MEQ tablet TAKE 1 TABLET BY MOUTH EVERY DAY  30 tablet  6  . metoprolol tartrate (LOPRESSOR) 25 MG tablet TAKE 1 TABLET BY MOUTH TWICE A DAY  64 tablet  9  . omeprazole (PRILOSEC) 20 MG capsule Take 20 mg by mouth daily.       . ramipril (ALTACE) 5 MG capsule TAKE ONE CAPSULE BY MOUTH EVERY DAY  30 capsule  3  . tiotropium (SPIRIVA) 18 MCG inhalation capsule Place 1 capsule (18 mcg total) into inhaler and inhale daily.  30 capsule  11  . warfarin (COUMADIN) 5 MG tablet USE AS DIRECTED BY ANTICOAGULATION CLINIC  40 tablet  3  . zolpidem (AMBIEN) 5 MG tablet Take 5 mg by mouth at bedtime as needed.          Allergies  Allergen Reactions  . Rosuvastatin     Past Medical History  Diagnosis Date  . Iron deficiency anemia, unspecified   . Other diseases of lung, not elsewhere classified   . Other primary cardiomyopathies   . Personal history of other diseases of digestive system   . Personal history of surgery to heart and great vessels, presenting hazards to health   . Acute and subacute bacterial endocarditis   . Arrhythmia   . Cerebrovascular disease, unspecified   . Esophageal reflux   . Generalized osteoarthrosis, unspecified site   . Hypertension   . COPD (chronic obstructive pulmonary disease) 1999    Past Surgical History    Procedure Date  . 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 Video assisted thoracoscopy     left-and miniature muscle sparing thoracotomy for left lower lobectomy 04/29/2007  dr. Cornelius Moras    No family history on file.  History   Social History  . Marital Status: Divorced    Spouse Name: N/A    Number of Children: N/A  . Years of Education: N/A   Occupational History  . Not on file.   Social History Main Topics  . Smoking status: Former Smoker -- 2.0 packs/day for 40 years    Types: Cigarettes    Quit date: 07/30/2004  . Smokeless tobacco: Never Used  . Alcohol Use: Yes  . Drug Use: Not on file  . Sexually Active: Not on file   Other Topics Concern  . Not on file   Social History Narrative  . No narrative on file    ROS: Please see the HPI.  All other systems reviewed and negative.  PHYSICAL EXAM:  BP 128/76  Pulse 55  Ht 5\' 9"  (1.753 m)  Wt 182 lb (82.555 kg)  BMI 26.88 kg/m2  General:  Well developed, well nourished, in no acute distress. Head:  Normocephalic and atraumatic. Neck: no JVD Lungs: Clear to auscultation and percussion. Heart: irregularly irregular without murmur.   Pulses: Pulses normal in all 4 extremities. Extremities: No clubbing or cyanosis. No edema. Neurologic: Alert and oriented x 3.  EKG:  Atrial fib with controlled ventricular response.  (Slow)  ECHO:  Study Conclusions  - Left ventricle: The cavity size was normal. Wall thickness was increased in a pattern of mild LVH. The estimated ejection fraction was 55%. Regional wall motion abnormalities cannot be excluded. As noted before, there may be some inferolateral hypokinesis. - Aortic valve: Trivial regurgitation. - Mitral valve: Mitral prosthesis stable in place. No significant MR. Mean gradient: 4mm Hg (D). - Left atrium: The atrium was severely dilated. - Right ventricle: The cavity size was mildly dilated. Systolic  function was mildly reduced. - Right atrium: The atrium was mildly dilated. - Pulmonary arteries: PA peak pressure: 40mm Hg (S). - Impressions: NO SIGNIFICANT CHANGE SINCE THE STUDY OF 2012. Impressions:  - NO SIGNIFICANT CHANGE SINCE THE STUDY OF 2012.    ASSESSMENT AND PLAN:

## 2012-02-05 NOTE — Patient Instructions (Addendum)
Your physician recommends that you have lab work today: BMP and CBC  Your physician recommends that you schedule a follow-up appointment in: 3 MONTHS  Your physician recommends that you continue on your current medications as directed. Please refer to the Current Medication list given to you today.  

## 2012-02-05 NOTE — Assessment & Plan Note (Signed)
Will need doppler in one year.

## 2012-02-05 NOTE — Assessment & Plan Note (Signed)
See echo report.  Stable.

## 2012-02-05 NOTE — Assessment & Plan Note (Signed)
Recheck CBC. 

## 2012-02-05 NOTE — Assessment & Plan Note (Signed)
Rate is controlled.  ON warfarin.  Will get CBC repeated.

## 2012-02-07 ENCOUNTER — Other Ambulatory Visit: Payer: Self-pay

## 2012-02-07 ENCOUNTER — Encounter: Payer: Self-pay | Admitting: Cardiology

## 2012-02-07 DIAGNOSIS — I4891 Unspecified atrial fibrillation: Secondary | ICD-10-CM

## 2012-02-07 NOTE — Telephone Encounter (Signed)
Patient returning nurse call, he can be reached at [phone].

## 2012-02-07 NOTE — Telephone Encounter (Signed)
This encounter was created in error - please disregard.

## 2012-02-13 ENCOUNTER — Telehealth: Payer: Self-pay | Admitting: Cardiology

## 2012-02-13 NOTE — Telephone Encounter (Signed)
Patient returning nurse call, he can be reached at [phone].  °

## 2012-02-13 NOTE — Telephone Encounter (Signed)
Patient called was given carotid doppler 02/05/12 results advised to have repeated in 2 years.

## 2012-02-15 ENCOUNTER — Other Ambulatory Visit (INDEPENDENT_AMBULATORY_CARE_PROVIDER_SITE_OTHER): Payer: Medicare Other

## 2012-02-15 ENCOUNTER — Ambulatory Visit (INDEPENDENT_AMBULATORY_CARE_PROVIDER_SITE_OTHER): Payer: Medicare Other | Admitting: *Deleted

## 2012-02-15 DIAGNOSIS — I4891 Unspecified atrial fibrillation: Secondary | ICD-10-CM

## 2012-02-15 DIAGNOSIS — I059 Rheumatic mitral valve disease, unspecified: Secondary | ICD-10-CM

## 2012-02-15 DIAGNOSIS — Z952 Presence of prosthetic heart valve: Secondary | ICD-10-CM

## 2012-02-15 DIAGNOSIS — Z7901 Long term (current) use of anticoagulants: Secondary | ICD-10-CM

## 2012-02-15 DIAGNOSIS — Z954 Presence of other heart-valve replacement: Secondary | ICD-10-CM

## 2012-02-15 LAB — CBC WITH DIFFERENTIAL/PLATELET
Basophils Absolute: 0 10*3/uL (ref 0.0–0.1)
Eosinophils Absolute: 0.1 10*3/uL (ref 0.0–0.7)
Lymphocytes Relative: 15.1 % (ref 12.0–46.0)
MCHC: 33 g/dL (ref 30.0–36.0)
Neutrophils Relative %: 67.3 % (ref 43.0–77.0)
RDW: 14.2 % (ref 11.5–14.6)

## 2012-02-22 ENCOUNTER — Ambulatory Visit (INDEPENDENT_AMBULATORY_CARE_PROVIDER_SITE_OTHER): Payer: Medicare Other | Admitting: Pharmacist

## 2012-02-22 DIAGNOSIS — Z952 Presence of prosthetic heart valve: Secondary | ICD-10-CM

## 2012-02-22 DIAGNOSIS — Z954 Presence of other heart-valve replacement: Secondary | ICD-10-CM

## 2012-02-22 DIAGNOSIS — Z7901 Long term (current) use of anticoagulants: Secondary | ICD-10-CM

## 2012-02-22 DIAGNOSIS — I059 Rheumatic mitral valve disease, unspecified: Secondary | ICD-10-CM

## 2012-02-22 DIAGNOSIS — I4891 Unspecified atrial fibrillation: Secondary | ICD-10-CM

## 2012-02-22 LAB — POCT INR: INR: 1.6

## 2012-02-29 ENCOUNTER — Ambulatory Visit (INDEPENDENT_AMBULATORY_CARE_PROVIDER_SITE_OTHER): Payer: Medicare Other | Admitting: *Deleted

## 2012-02-29 DIAGNOSIS — I059 Rheumatic mitral valve disease, unspecified: Secondary | ICD-10-CM

## 2012-02-29 DIAGNOSIS — Z954 Presence of other heart-valve replacement: Secondary | ICD-10-CM

## 2012-02-29 DIAGNOSIS — Z952 Presence of prosthetic heart valve: Secondary | ICD-10-CM

## 2012-02-29 DIAGNOSIS — I4891 Unspecified atrial fibrillation: Secondary | ICD-10-CM

## 2012-02-29 DIAGNOSIS — Z7901 Long term (current) use of anticoagulants: Secondary | ICD-10-CM

## 2012-02-29 LAB — POCT INR: INR: 3.3

## 2012-03-04 ENCOUNTER — Other Ambulatory Visit: Payer: Self-pay | Admitting: Cardiology

## 2012-03-14 ENCOUNTER — Ambulatory Visit (INDEPENDENT_AMBULATORY_CARE_PROVIDER_SITE_OTHER): Payer: Medicare Other | Admitting: Pharmacist

## 2012-03-14 DIAGNOSIS — I4891 Unspecified atrial fibrillation: Secondary | ICD-10-CM

## 2012-03-14 DIAGNOSIS — Z954 Presence of other heart-valve replacement: Secondary | ICD-10-CM

## 2012-03-14 DIAGNOSIS — Z7901 Long term (current) use of anticoagulants: Secondary | ICD-10-CM

## 2012-03-14 DIAGNOSIS — I059 Rheumatic mitral valve disease, unspecified: Secondary | ICD-10-CM

## 2012-03-14 DIAGNOSIS — Z952 Presence of prosthetic heart valve: Secondary | ICD-10-CM

## 2012-03-14 LAB — POCT INR: INR: 2.9

## 2012-03-20 ENCOUNTER — Encounter (HOSPITAL_COMMUNITY): Payer: Self-pay

## 2012-03-20 ENCOUNTER — Emergency Department (HOSPITAL_COMMUNITY)
Admission: EM | Admit: 2012-03-20 | Discharge: 2012-03-21 | Disposition: A | Discharge status: Home / Self Care | Payer: Medicare Other | Attending: Emergency Medicine | Admitting: Emergency Medicine

## 2012-03-20 ENCOUNTER — Emergency Department (HOSPITAL_COMMUNITY): Payer: Medicare Other

## 2012-03-20 DIAGNOSIS — M949 Disorder of cartilage, unspecified: Secondary | ICD-10-CM | POA: Insufficient documentation

## 2012-03-20 DIAGNOSIS — I679 Cerebrovascular disease, unspecified: Secondary | ICD-10-CM | POA: Insufficient documentation

## 2012-03-20 DIAGNOSIS — I10 Essential (primary) hypertension: Secondary | ICD-10-CM

## 2012-03-20 DIAGNOSIS — Y9289 Other specified places as the place of occurrence of the external cause: Secondary | ICD-10-CM | POA: Insufficient documentation

## 2012-03-20 DIAGNOSIS — Z79899 Other long term (current) drug therapy: Secondary | ICD-10-CM | POA: Insufficient documentation

## 2012-03-20 DIAGNOSIS — K219 Gastro-esophageal reflux disease without esophagitis: Secondary | ICD-10-CM | POA: Insufficient documentation

## 2012-03-20 DIAGNOSIS — I4891 Unspecified atrial fibrillation: Secondary | ICD-10-CM

## 2012-03-20 DIAGNOSIS — M47812 Spondylosis without myelopathy or radiculopathy, cervical region: Secondary | ICD-10-CM | POA: Insufficient documentation

## 2012-03-20 DIAGNOSIS — M79609 Pain in unspecified limb: Secondary | ICD-10-CM | POA: Insufficient documentation

## 2012-03-20 DIAGNOSIS — R791 Abnormal coagulation profile: Secondary | ICD-10-CM

## 2012-03-20 DIAGNOSIS — W19XXXA Unspecified fall, initial encounter: Secondary | ICD-10-CM

## 2012-03-20 DIAGNOSIS — Z87891 Personal history of nicotine dependence: Secondary | ICD-10-CM | POA: Insufficient documentation

## 2012-03-20 DIAGNOSIS — M899 Disorder of bone, unspecified: Secondary | ICD-10-CM | POA: Insufficient documentation

## 2012-03-20 DIAGNOSIS — M546 Pain in thoracic spine: Secondary | ICD-10-CM | POA: Insufficient documentation

## 2012-03-20 DIAGNOSIS — J449 Chronic obstructive pulmonary disease, unspecified: Secondary | ICD-10-CM | POA: Insufficient documentation

## 2012-03-20 DIAGNOSIS — R079 Chest pain, unspecified: Secondary | ICD-10-CM | POA: Insufficient documentation

## 2012-03-20 DIAGNOSIS — J4489 Other specified chronic obstructive pulmonary disease: Secondary | ICD-10-CM | POA: Insufficient documentation

## 2012-03-20 LAB — PROTIME-INR
INR: 4.12 — ABNORMAL HIGH (ref 0.00–1.49)
Prothrombin Time: 40.5 seconds — ABNORMAL HIGH (ref 11.6–15.2)

## 2012-03-20 LAB — COMPREHENSIVE METABOLIC PANEL
Albumin: 3.8 g/dL (ref 3.5–5.2)
Alkaline Phosphatase: 78 U/L (ref 39–117)
BUN: 10 mg/dL (ref 6–23)
Calcium: 8.9 mg/dL (ref 8.4–10.5)
Creatinine, Ser: 1.04 mg/dL (ref 0.50–1.35)
Potassium: 3.9 mEq/L (ref 3.5–5.1)
Total Protein: 6.7 g/dL (ref 6.0–8.3)

## 2012-03-20 LAB — URINALYSIS, ROUTINE W REFLEX MICROSCOPIC
Leukocytes, UA: NEGATIVE
Nitrite: NEGATIVE
Protein, ur: NEGATIVE mg/dL
Urobilinogen, UA: 0.2 mg/dL (ref 0.0–1.0)

## 2012-03-20 LAB — CBC WITH DIFFERENTIAL/PLATELET
Basophils Absolute: 0 10*3/uL (ref 0.0–0.1)
Eosinophils Absolute: 0 10*3/uL (ref 0.0–0.7)
HCT: 36.3 % — ABNORMAL LOW (ref 39.0–52.0)
Lymphocytes Relative: 7 % — ABNORMAL LOW (ref 12–46)
MCH: 32.9 pg (ref 26.0–34.0)
MCHC: 34.7 g/dL (ref 30.0–36.0)
MCHC: 34.5 g/dL (ref 30.0–36.0)
MCV: 95.4 fL (ref 78.0–100.0)
Neutro Abs: 10.9 10*3/uL — ABNORMAL HIGH (ref 1.7–7.7)
Neutrophils Relative %: 82 % — ABNORMAL HIGH (ref 43–77)
Platelets: 195 10*3/uL (ref 150–400)
RDW: 13.2 % (ref 11.5–15.5)

## 2012-03-20 LAB — URINE MICROSCOPIC-ADD ON

## 2012-03-20 LAB — TROPONIN I: Troponin I: <0.3 ng/mL (ref <0.30)

## 2012-03-20 MED ORDER — IOHEXOL 350 MG/ML SOLN
100.0000 mL | Freq: Once | INTRAVENOUS | Status: AC | PRN
  Administered 2012-03-20: 100 mL via INTRAVENOUS

## 2012-03-20 MED ORDER — MORPHINE SULFATE 4 MG/ML IJ SOLN
INTRAMUSCULAR | Status: AC
  Filled 2012-03-20: qty 1

## 2012-03-20 MED ORDER — MORPHINE SULFATE 4 MG/ML IJ SOLN
6.0000 mg | Freq: Once | INTRAMUSCULAR | Status: AC
  Administered 2012-03-20: 6 mg via INTRAVENOUS
  Filled 2012-03-20: qty 1

## 2012-03-20 NOTE — ED Notes (Signed)
EMS reports pt woke with right,left arm pain along with spine and abdominal pain along with right leg.  No tenderness with palpation.  Fall last week with various bruises in several places.  Face appears bruised with no noted injury.  Pt is on coumadin.  Family reports pt's INR level is unstable.

## 2012-03-20 NOTE — Consult Note (Signed)
Requesting physician: EDP  Primary Care Physician: MABE,DAVID, NP  History of Present Illness: Jeffery Copeland is a 66 year old male with history of COPD, paroxysmal A. fib on Coumadin presented to the emergency room with arm pain after he woke up this morning. Patient reports that he fell on a boat about a week ago but denies any injuries from that fall. This morning he woke up and noted that his right arm was hurting and he noticed bruising on his and face and some bleeding from his left ear subsequently presented to the emergency room. Where he had CT head,  Spine,  Chest, x-ray humerus and chest performed [facility] were consulted earlier in the day and didn't feel this patient needed to be admitted, I have been asked to consult on him in the emergency room.  Allergies:   Allergies  Allergen Reactions  . Rosuvastatin       Past Medical History  Diagnosis Date  . Iron deficiency anemia, unspecified   . Other diseases of lung, not elsewhere classified   . Other primary cardiomyopathies   . Personal history of other diseases of digestive system   . Personal history of surgery to heart and great vessels, presenting hazards to health   . Acute and subacute bacterial endocarditis   . Arrhythmia   . Cerebrovascular disease, unspecified   . Esophageal reflux   . Generalized osteoarthrosis, unspecified site   . Hypertension   . COPD (chronic obstructive pulmonary disease) 1999    Past Surgical History  Procedure Date  . 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 Video assisted thoracoscopy     left-and miniature muscle sparing thoracotomy for left lower lobectomy 04/29/2007  dr. Cornelius Moras    Scheduled Meds:   .  morphine injection  6 mg Intravenous Once   Continuous Infusions:  PRN Meds:.iohexol  Social History:  reports that he quit smoking about 7 years ago. His smoking use included Cigarettes. He has a 80 pack-year  smoking history. He has never used smokeless tobacco. He reports that he drinks alcohol. His drug history not on file.  History reviewed. No pertinent family history.  Review of Systems:  Constitutional: Denies fever, chills, diaphoresis, appetite change and fatigue.  HEENT: Denies photophobia, eye pain, redness, hearing loss, ear pain, congestion, sore throat, rhinorrhea, sneezing, mouth sores, trouble swallowing, neck pain, neck stiffness and tinnitus.   Respiratory: Denies SOB, DOE, cough, chest tightness,  and wheezing.   Cardiovascular: Denies chest pain, palpitations and leg swelling.  Gastrointestinal: Denies nausea, vomiting, abdominal pain, diarrhea, constipation, blood in stool and abdominal distention.  Genitourinary: Denies dysuria, urgency, frequency, hematuria, flank pain and difficulty urinating.  Musculoskeletal: Denies myalgias, back pain, joint swelling, arthralgias and gait problem.  Skin: Denies pallor, rash and wound.  Neurological: Denies dizziness, seizures, syncope, weakness, light-headedness, numbness and headaches.  Hematological: Denies adenopathy. Easy bruising, personal or family bleeding history  Psychiatric/Behavioral: Denies suicidal ideation, mood changes, confusion, nervousness, sleep disturbance and agitation   Physical Exam: Blood pressure 125/81, pulse 85, temperature 98.6 F (37 C), temperature source Oral, resp. rate 16, SpO2 100.00%. General alert awake oriented place person and time HEENT: Ecchymosis of entire face predominantly both cheeks, pupils equal reactive to light extraocular movements intact neck no JVD or lymphadenopathy CVS S1-S2 regular irregular rhythm no murmurs rubs or gallops Lungs poor air movement bilaterally Abdomen soft nontender with normal bowel sounds no organomegaly Extremities no edema clubbing or  cyanosis Skin ecchymosis noted on right arm and back scapular region Neuro moves all extremities  Labs on Admission:  Results  for orders placed during the hospital encounter of 03/20/12 (from the past 48 hour(s))  CBC WITH DIFFERENTIAL     Status: Abnormal   Collection Time   03/20/12 12:52 PM      Component Value Range Comment   WBC 13.3 (*) 4.0 - 10.5 K/uL    RBC 3.85 (*) 4.22 - 5.81 MIL/uL    Hemoglobin 12.6 (*) 13.0 - 17.0 g/dL    HCT 16.1 (*) 09.6 - 52.0 %    MCV 94.3  78.0 - 100.0 fL    MCH 32.7  26.0 - 34.0 pg    MCHC 34.7  30.0 - 36.0 g/dL    RDW 04.5  40.9 - 81.1 %    Platelets 190  150 - 400 K/uL    Neutrophils Relative 82 (*) 43 - 77 %    Lymphocytes Relative 7 (*) 12 - 46 %    Monocytes Relative 11  3 - 12 %    Eosinophils Relative 0  0 - 5 %    Basophils Relative 0  0 - 1 %    Neutro Abs 10.9 (*) 1.7 - 7.7 K/uL    Lymphs Abs 0.9  0.7 - 4.0 K/uL    Monocytes Absolute 1.5 (*) 0.1 - 1.0 K/uL    Eosinophils Absolute 0.0  0.0 - 0.7 K/uL    Basophils Absolute 0.0  0.0 - 0.1 K/uL    WBC Morphology MILD LEFT SHIFT (1-5% METAS, OCC MYELO, OCC BANDS)     COMPREHENSIVE METABOLIC PANEL     Status: Abnormal   Collection Time   03/20/12 12:52 PM      Component Value Range Comment   Sodium 131 (*) 135 - 145 mEq/L    Potassium 3.9  3.5 - 5.1 mEq/L    Chloride 96  96 - 112 mEq/L    CO2 23  19 - 32 mEq/L    Glucose, Bld 129 (*) 70 - 99 mg/dL    BUN 10  6 - 23 mg/dL    Creatinine, Ser 9.14  0.50 - 1.35 mg/dL    Calcium 8.9  8.4 - 78.2 mg/dL    Total Protein 6.7  6.0 - 8.3 g/dL    Albumin 3.8  3.5 - 5.2 g/dL    AST 32  0 - 37 U/L    ALT 14  0 - 53 U/L    Alkaline Phosphatase 78  39 - 117 U/L    Total Bilirubin 1.1  0.3 - 1.2 mg/dL    GFR calc non Af Amer 73 (*) >90 mL/min    GFR calc Af Amer 84 (*) >90 mL/min   PROTIME-INR     Status: Abnormal   Collection Time   03/20/12 12:52 PM      Component Value Range Comment   Prothrombin Time 40.5 (*) 11.6 - 15.2 seconds    INR 4.12 (*) 0.00 - 1.49   TROPONIN I     Status: Normal   Collection Time   03/20/12 12:52 PM      Component Value Range Comment    Troponin I <0.30  <0.30 ng/mL   URINALYSIS, ROUTINE W REFLEX MICROSCOPIC     Status: Abnormal   Collection Time   03/20/12  2:13 PM      Component Value Range Comment   Color, Urine YELLOW  YELLOW  APPearance CLEAR  CLEAR    Specific Gravity, Urine 1.015  1.005 - 1.030    pH 5.5  5.0 - 8.0    Glucose, UA NEGATIVE  NEGATIVE mg/dL    Hgb urine dipstick SMALL (*) NEGATIVE    Bilirubin Urine NEGATIVE  NEGATIVE    Ketones, ur 15 (*) NEGATIVE mg/dL    Protein, ur NEGATIVE  NEGATIVE mg/dL    Urobilinogen, UA 0.2  0.0 - 1.0 mg/dL    Nitrite NEGATIVE  NEGATIVE    Leukocytes, UA NEGATIVE  NEGATIVE   URINE MICROSCOPIC-ADD ON     Status: Normal   Collection Time   03/20/12  2:13 PM      Component Value Range Comment   WBC, UA 0-2  <3 WBC/hpf    RBC / HPF 0-2  <3 RBC/hpf   CBC WITH DIFFERENTIAL     Status: Abnormal   Collection Time   03/20/12  8:36 PM      Component Value Range Comment   WBC 10.8 (*) 4.0 - 10.5 K/uL    RBC 3.89 (*) 4.22 - 5.81 MIL/uL    Hemoglobin 12.8 (*) 13.0 - 17.0 g/dL    HCT 40.9 (*) 81.1 - 52.0 %    MCV 95.4  78.0 - 100.0 fL    MCH 32.9  26.0 - 34.0 pg    MCHC 34.5  30.0 - 36.0 g/dL    RDW 91.4  78.2 - 95.6 %    Platelets 195  150 - 400 K/uL     Radiological Exams on Admission: Dg Chest 2 View  03/20/2012  *RADIOLOGY REPORT*  Clinical Data: Chest pain.  CHEST - 2 VIEW  Comparison: 02/26/2011.  Findings: The cardiac silhouette, mediastinal and hilar contours are stable.  Stable surgical changes with median sternotomy wires and a prosthetic mitral valve.  Stable loss of volume in the left hemithorax related to previous surgery.  Chronic pleural thickening and parenchymal scarring changes are noted.  The right lung is relatively clear.  There are remote healed rib fractures bilaterally.  Multiple thoracic compression fractures are again demonstrated.  IMPRESSION: Chronic lung changes.  No acute pulmonary findings.   Original Report Authenticated By: P. Loralie Champagne,  M.D.    Ct Head Wo Contrast  03/20/2012  *RADIOLOGY REPORT*  Clinical Data:  .  Bleeding from left year.  Fall.  CT HEAD WITHOUT CONTRAST CT CERVICAL SPINE WITHOUT CONTRAST  Technique:  Multidetector CT imaging of the head and cervical spine was performed following the standard protocol without intravenous contrast.  Multiplanar CT image reconstructions of the cervical spine were also generated.  Comparison:  None.  CT HEAD  Findings: No acute intracranial abnormality.  Specifically, no hemorrhage, hydrocephalus, mass lesion, acute infarction, or significant intracranial injury.  No acute calvarial abnormality. Extensive mucosal disease within the ethmoid air cells and frontal sinuses.  Visualized sphenoid and maxillary sinuses are clear. Mastoids are clear.  Soft tissue noted in the left external auditory canal which could be evaluated with direct visualization.  IMPRESSION: No acute intracranial abnormality.  Chronic ethmoid and frontal sinusitis.  CT CERVICAL SPINE  Findings: Comparison made to prior thoracic spine CT 01/19/2011. Diffuse degenerative disc disease throughout the cervical spine was normal alignment.  Prevertebral soft tissues are normal.  There is mild compression noted involving the T2 and T3 vertebral bodies. This appears new at T2, chronic at T3.  No additional fracture.  IMPRESSION: Spondylosis.  Mild compression fracture and T2, age indeterminate but new  since 2012 thoracic spine study.  Chronic mild T3 compression fracture.   Original Report Authenticated By: Cyndie Chime, M.D.    Ct Angio Chest Pe W/cm &/or Wo Cm  03/20/2012  *RADIOLOGY REPORT*  Clinical Data: Right arm pain  CT ANGIOGRAPHY CHEST  Technique:  Multidetector CT imaging of the chest using the standard protocol during bolus administration of intravenous contrast. Multiplanar reconstructed images including MIPs were obtained and reviewed to evaluate the vascular anatomy.  Contrast: OMNIPAQUE IOHEXOL 350 MG/ML SOLN   Comparison: 01/19/2011 and 03/02/2007  Findings: Bolus timing is optimized for the pulmonary arteries. There is very limited contrast opacification in the aorta and great vessels.  There are no filling defects in the pulmonary arterial tree to suggest acute pulmonary thromboembolism.  Mixing artifact in the left pulmonary artery branches are noted secondary to poor flow.  Atherosclerotic changes at the origin of the right subclavian artery are present. Subclavian artery patency cannot be assessed by this study.  The left atrium is markedly dilated.  Mitral valve replacement hardware is in place.  There is volume loss within the left lung.  The left upper lobe bronchus is not clearly visualized and the left upper lobe may have been resected.  There is filling defect within the left lower lobe main bronchus either due to secretions or endobronchial lesion. Volume loss within the lung parenchyma of the left lower lobe which is predominately chronic is present.  There is pleural thickening at the left base.  Dependent atelectasis at the right lung base. Superimposed severe emphysema.  Multilevel compression deformities are noted throughout the thoracic and upper lumbar spine.  There has been further loss of height at T9 with sclerotic change.  On the prior study, an acute fracture was noted at this level and these findings represent a normal evolution of vertebral body subsequent collapse and healing.  Degenerative changes in the lower cervical spine are noted.  There is no obvious mass in the right brachial plexus.  Hypodensities in both kidneys are incompletely characterized.  IMPRESSION: No evidence of acute pulmonary thromboembolism.  Volume loss within the left lung has increased  with chronic appearing atelectasis at the left base.  Dependent atelectasis is also present at the right lung base.  Filling defects are noted within the left lower lobe bronchus . This may reflect secretions however an endobronchial  lesion is not excluded.  Innumerable thoracic compression deformities.  The only change demonstrated is healing of the previously noted acute T9 fracture.   Original Report Authenticated By: Donavan Burnet, M.D.    Ct Cervical Spine Wo Contrast  03/20/2012  *RADIOLOGY REPORT*  Clinical Data:  .  Bleeding from left year.  Fall.  CT HEAD WITHOUT CONTRAST CT CERVICAL SPINE WITHOUT CONTRAST  Technique:  Multidetector CT imaging of the head and cervical spine was performed following the standard protocol without intravenous contrast.  Multiplanar CT image reconstructions of the cervical spine were also generated.  Comparison:  None.  CT HEAD  Findings: No acute intracranial abnormality.  Specifically, no hemorrhage, hydrocephalus, mass lesion, acute infarction, or significant intracranial injury.  No acute calvarial abnormality. Extensive mucosal disease within the ethmoid air cells and frontal sinuses.  Visualized sphenoid and maxillary sinuses are clear. Mastoids are clear.  Soft tissue noted in the left external auditory canal which could be evaluated with direct visualization.  IMPRESSION: No acute intracranial abnormality.  Chronic ethmoid and frontal sinusitis.  CT CERVICAL SPINE  Findings: Comparison made to prior  thoracic spine CT 01/19/2011. Diffuse degenerative disc disease throughout the cervical spine was normal alignment.  Prevertebral soft tissues are normal.  There is mild compression noted involving the T2 and T3 vertebral bodies. This appears new at T2, chronic at T3.  No additional fracture.  IMPRESSION: Spondylosis.  Mild compression fracture and T2, age indeterminate but new since 2012 thoracic spine study.  Chronic mild T3 compression fracture.   Original Report Authenticated By: Cyndie Chime, M.D.    Dg Humerus Right  03/20/2012  *RADIOLOGY REPORT*  Clinical Data: Right arm pain following a fall.  RIGHT HUMERUS - 2+ VIEW  Comparison: Right elbow dated 03/09/2009.  Findings: Diffuse  osteopenia.  No fracture or dislocation seen.  IMPRESSION: No fracture.   Original Report Authenticated By: Darrol Angel, M.D.     Assessment/Plan 1. recent fall 2. ecchymosis of face and right arm 3. coagulopathy secondary to Coumadin EKG, CT scan and labs reviewed Patient advised to hold Coumadin over the next few days untill followup with primary M.D. Dr. Duanne Guess Also advised to increase activity slowly Followup with primary care doctor Friday 8/23 if possible, he already has an appointment on Monday 8/26   Time Spent on Consultation:  Brig[facility] Edelen [facility]  Pager:[phone] 03/20/2012, 9:33 PM

## 2012-03-20 NOTE — ED Provider Notes (Signed)
66 y/o male moved to CDU awaiting CTA results. Results not showing any reason for facial redness/petichiae. Discussed results with Dr. Manus Gunning who initially evaluated patient, and medicine consult suggested. Will consult medicine for evaluation. 9:43 PM Medicine came to see patient. Suggested he stop coumadin until he sees his PCP on Monday and advised him to call PCP tomorrow. She has no concerns needing admission. Ok to discharge patient.  Trevor Mace, PA-C 03/20/12 2144

## 2012-03-20 NOTE — ED Notes (Signed)
Called and spoke with pt's sister Wynona Canes informed her that pt was being discharged and would need a ride home.   St's she will come to pick up pt and bring clothes.

## 2012-03-20 NOTE — ED Provider Notes (Signed)
History     CSN: [id number]  Arrival date & time 03/20/12  1131   First MD Initiated Contact with Patient 03/20/12 1222      Chief Complaint  Patient presents with  . Otalgia    with body pain    (Consider location/radiation/quality/duration/timing/severity/associated sxs/prior treatment) HPI Patient presents emergency department with right upper arm pain and upper back pain.  Patient, states that he fell week while on a boat.  Patient denies chest pain, shortness of breath, vomiting, abdominal pain, visual changes, weakness, numbness, or diarrhea.  Patient, states that he's had bleeding from his ear today.  Patient denies fall or other trauma since last week.  Patient, states his ear was bleeding today. He also has bruising to his face. Past Medical History  Diagnosis Date  . Iron deficiency anemia, unspecified   . Other diseases of lung, not elsewhere classified   . Other primary cardiomyopathies   . Personal history of other diseases of digestive system   . Personal history of surgery to heart and great vessels, presenting hazards to health   . Acute and subacute bacterial endocarditis   . Arrhythmia   . Cerebrovascular disease, unspecified   . Esophageal reflux   . Generalized osteoarthrosis, unspecified site   . Hypertension   . COPD (chronic obstructive pulmonary disease) 1999    Past Surgical History  Procedure Date  . 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 Video assisted thoracoscopy     left-and miniature muscle sparing thoracotomy for left lower lobectomy 04/29/2007  dr. Cornelius Moras    History reviewed. No pertinent family history.  History  Substance Use Topics  . Smoking status: Former Smoker -- 2.0 packs/day for 40 years    Types: Cigarettes    Quit date: 07/30/2004  . Smokeless tobacco: Never Used  . Alcohol Use: Yes      Review of Systems All other systems negative except as documented in the  HPI. All pertinent positives and negatives as reviewed in the HPI.  Allergies  Rosuvastatin  Home Medications   Current Outpatient Rx  Name Route Sig Dispense Refill  . VITAMIN D3 1000 UNITS PO CAPS Oral Take 1 capsule by mouth.      . EZETIMIBE 10 MG PO TABS Oral Take 10 mg by mouth daily.      . FUROSEMIDE 20 MG PO TABS Oral Take 20 mg by mouth daily.    Marland Kitchen HYDROCODONE-ACETAMINOPHEN 10-500 MG PO TABS Oral Take 1 tablet by mouth every 6 (six) hours as needed. For pain.    Marland Kitchen METOPROLOL TARTRATE 25 MG PO TABS Oral Take 25 mg by mouth 2 (two) times daily.    Marland Kitchen OMEPRAZOLE 20 MG PO CPDR Oral Take 20 mg by mouth daily.     Marland Kitchen POTASSIUM CHLORIDE CRYS ER 20 MEQ PO TBCR Oral Take 20 mEq by mouth daily.    Marland Kitchen RAMIPRIL 5 MG PO CAPS Oral Take 5 mg by mouth daily.    Marland Kitchen TIOTROPIUM BROMIDE MONOHYDRATE 18 MCG IN CAPS Inhalation Place 1 capsule (18 mcg total) into inhaler and inhale daily. 30 capsule 11  . WARFARIN SODIUM 5 MG PO TABS Oral Take 5 mg by mouth daily.    Marland Kitchen ZOLPIDEM TARTRATE 5 MG PO TABS Oral Take 5 mg by mouth at bedtime as needed. For insomnia.      BP 137/69  Pulse 87  Temp 98.6 F (37 C) (Oral)  Resp  97  SpO2 97%  Physical Exam  Nursing note and vitals reviewed. Constitutional: He is oriented to person, place, and time. He appears well-developed and well-nourished. No distress.  HENT:  Head: Normocephalic and atraumatic.  Mouth/Throat: Oropharynx is clear and moist.       Petechiae type area of his face and neck.  Eyes: Pupils are equal, round, and reactive to light.  Neck: Normal range of motion. Neck supple.  Cardiovascular: Normal rate, regular rhythm and normal heart sounds.   Pulmonary/Chest: Effort normal and breath sounds normal. No respiratory distress.  Musculoskeletal:       Back:       Arms: Neurological: He is alert and oriented to person, place, and time.  Skin: Skin is warm and dry.    ED Course  Procedures (including critical care time)  Labs Reviewed    CBC WITH DIFFERENTIAL - Abnormal; Notable for the following:    WBC 13.3 (*)     RBC 3.85 (*)     Hemoglobin 12.6 (*)     HCT 36.3 (*)     Neutrophils Relative 82 (*)     Lymphocytes Relative 7 (*)     Neutro Abs 10.9 (*)     Monocytes Absolute 1.5 (*)     All other components within normal limits  COMPREHENSIVE METABOLIC PANEL - Abnormal; Notable for the following:    Sodium 131 (*)     Glucose, Bld 129 (*)     GFR calc non Af Amer 73 (*)     GFR calc Af Amer 84 (*)     All other components within normal limits  PROTIME-INR - Abnormal; Notable for the following:    Prothrombin Time 40.5 (*)     INR 4.12 (*)     All other components within normal limits  URINALYSIS, ROUTINE W REFLEX MICROSCOPIC - Abnormal; Notable for the following:    Hgb urine dipstick SMALL (*)     Ketones, ur 15 (*)     All other components within normal limits  TROPONIN I  URINE MICROSCOPIC-ADD ON   Dg Chest 2 View  03/20/2012  *RADIOLOGY REPORT*  Clinical Data: Chest pain.  CHEST - 2 VIEW  Comparison: 02/26/2011.  Findings: The cardiac silhouette, mediastinal and hilar contours are stable.  Stable surgical changes with median sternotomy wires and a prosthetic mitral valve.  Stable loss of volume in the left hemithorax related to previous surgery.  Chronic pleural thickening and parenchymal scarring changes are noted.  The right lung is relatively clear.  There are remote healed rib fractures bilaterally.  Multiple thoracic compression fractures are again demonstrated.  IMPRESSION: Chronic lung changes.  No acute pulmonary findings.   Original Report Authenticated By: P. Loralie Champagne, M.D.    Ct Head Wo Contrast  03/20/2012  *RADIOLOGY REPORT*  Clinical Data:  .  Bleeding from left year.  Fall.  CT HEAD WITHOUT CONTRAST CT CERVICAL SPINE WITHOUT CONTRAST  Technique:  Multidetector CT imaging of the head and cervical spine was performed following the standard protocol without intravenous contrast.  Multiplanar  CT image reconstructions of the cervical spine were also generated.  Comparison:  None.  CT HEAD  Findings: No acute intracranial abnormality.  Specifically, no hemorrhage, hydrocephalus, mass lesion, acute infarction, or significant intracranial injury.  No acute calvarial abnormality. Extensive mucosal disease within the ethmoid air cells and frontal sinuses.  Visualized sphenoid and maxillary sinuses are clear. Mastoids are clear.  Soft tissue noted in the left  external auditory canal which could be evaluated with direct visualization.  IMPRESSION: No acute intracranial abnormality.  Chronic ethmoid and frontal sinusitis.  CT CERVICAL SPINE  Findings: Comparison made to prior thoracic spine CT 01/19/2011. Diffuse degenerative disc disease throughout the cervical spine was normal alignment.  Prevertebral soft tissues are normal.  There is mild compression noted involving the T2 and T3 vertebral bodies. This appears new at T2, chronic at T3.  No additional fracture.  IMPRESSION: Spondylosis.  Mild compression fracture and T2, age indeterminate but new since 2012 thoracic spine study.  Chronic mild T3 compression fracture.   Original Report Authenticated By: Cyndie Chime, M.D.    Ct Cervical Spine Wo Contrast  03/20/2012  *RADIOLOGY REPORT*  Clinical Data:  .  Bleeding from left year.  Fall.  CT HEAD WITHOUT CONTRAST CT CERVICAL SPINE WITHOUT CONTRAST  Technique:  Multidetector CT imaging of the head and cervical spine was performed following the standard protocol without intravenous contrast.  Multiplanar CT image reconstructions of the cervical spine were also generated.  Comparison:  None.  CT HEAD  Findings: No acute intracranial abnormality.  Specifically, no hemorrhage, hydrocephalus, mass lesion, acute infarction, or significant intracranial injury.  No acute calvarial abnormality. Extensive mucosal disease within the ethmoid air cells and frontal sinuses.  Visualized sphenoid and maxillary sinuses are  clear. Mastoids are clear.  Soft tissue noted in the left external auditory canal which could be evaluated with direct visualization.  IMPRESSION: No acute intracranial abnormality.  Chronic ethmoid and frontal sinusitis.  CT CERVICAL SPINE  Findings: Comparison made to prior thoracic spine CT 01/19/2011. Diffuse degenerative disc disease throughout the cervical spine was normal alignment.  Prevertebral soft tissues are normal.  There is mild compression noted involving the T2 and T3 vertebral bodies. This appears new at T2, chronic at T3.  No additional fracture.  IMPRESSION: Spondylosis.  Mild compression fracture and T2, age indeterminate but new since 2012 thoracic spine study.  Chronic mild T3 compression fracture.   Original Report Authenticated By: Cyndie Chime, M.D.    Dg Humerus Right  03/20/2012  *RADIOLOGY REPORT*  Clinical Data: Right arm pain following a fall.  RIGHT HUMERUS - 2+ VIEW  Comparison: Right elbow dated 03/09/2009.  Findings: Diffuse osteopenia.  No fracture or dislocation seen.  IMPRESSION: No fracture.   Original Report Authenticated By: Darrol Angel, M.D.     The patient is stable here in the ER.    MDM          Carlyle Dolly, PA-C 03/20/12 [phone]

## 2012-03-20 NOTE — ED Provider Notes (Signed)
This chart was scribed for Glynn Octave, MD by Bennett Scrape. This patient was seen in room TR10C/TR10C and the patient's care was started at 11:54AM.  Jeffery Copeland is a 66 y.o. male who presents to the Emergency Department by ambulance complaining of bilateral arm pain, back pain and that he woke up with this morning. He currently c/o sternal CP, emesis and upper back pain. He has petechiae on his face but is unsure when it developed. Nurse reports that the pt is bleeding out of his left ear. He reports that he fell on week ago on a boat but denies any injuries from the fall. He denies abdominal pain, loss of bowels or bladder, melena and hematochezia as associated symptoms. He is currently on coumadin.   Skin: diffuse petechiae to head, neck and chest ENT: gross blood in the left ear Neurological: Awake, alert, cranial nerves intact, 5/5 strength throughout Musculoskeletal:  tenderness to palpation along thoracic and cervical spine Abdomen: abdomen soft and non-tender   11:55AM-Will order EKG, blood work  Ear bleeding, petechiae to body, atraumatic extremity and back pain.  Equal grip strengths bilaterally.   Will need CT, labs, further workup in main ED.  . I personally performed the services described in this documentation, which was scribed in my presence.  The recorded information has been reviewed and considered.        Glynn Octave, MD 03/20/12 1425

## 2012-03-20 NOTE — ED Notes (Signed)
Patient unable to void at this time

## 2012-03-25 NOTE — ED Provider Notes (Signed)
Medical screening examination/treatment/procedure(s) were performed by non-physician practitioner and as supervising physician I was immediately available for consultation/collaboration.  Rakiya Krawczyk R. Carron Mcmurry, MD 03/25/12 0012 

## 2012-04-02 ENCOUNTER — Other Ambulatory Visit: Payer: Self-pay | Admitting: Cardiology

## 2012-04-11 ENCOUNTER — Ambulatory Visit (INDEPENDENT_AMBULATORY_CARE_PROVIDER_SITE_OTHER): Payer: Medicare PPO | Admitting: *Deleted

## 2012-04-11 DIAGNOSIS — Z954 Presence of other heart-valve replacement: Secondary | ICD-10-CM

## 2012-04-11 DIAGNOSIS — Z7901 Long term (current) use of anticoagulants: Secondary | ICD-10-CM

## 2012-04-11 DIAGNOSIS — Z952 Presence of prosthetic heart valve: Secondary | ICD-10-CM

## 2012-04-11 DIAGNOSIS — I059 Rheumatic mitral valve disease, unspecified: Secondary | ICD-10-CM

## 2012-04-11 DIAGNOSIS — I4891 Unspecified atrial fibrillation: Secondary | ICD-10-CM

## 2012-04-11 LAB — POCT INR: INR: 2.4

## 2012-04-25 ENCOUNTER — Ambulatory Visit (INDEPENDENT_AMBULATORY_CARE_PROVIDER_SITE_OTHER): Payer: Medicare Other | Admitting: *Deleted

## 2012-04-25 DIAGNOSIS — I4891 Unspecified atrial fibrillation: Secondary | ICD-10-CM

## 2012-04-25 DIAGNOSIS — Z952 Presence of prosthetic heart valve: Secondary | ICD-10-CM

## 2012-04-25 DIAGNOSIS — Z954 Presence of other heart-valve replacement: Secondary | ICD-10-CM

## 2012-04-25 DIAGNOSIS — I059 Rheumatic mitral valve disease, unspecified: Secondary | ICD-10-CM

## 2012-04-25 DIAGNOSIS — Z7901 Long term (current) use of anticoagulants: Secondary | ICD-10-CM

## 2012-05-07 ENCOUNTER — Ambulatory Visit (INDEPENDENT_AMBULATORY_CARE_PROVIDER_SITE_OTHER): Payer: Medicare PPO | Admitting: Cardiology

## 2012-05-07 ENCOUNTER — Encounter: Payer: Self-pay | Admitting: Cardiology

## 2012-05-07 ENCOUNTER — Ambulatory Visit (INDEPENDENT_AMBULATORY_CARE_PROVIDER_SITE_OTHER): Payer: Medicare PPO | Admitting: *Deleted

## 2012-05-07 VITALS — BP 133/71 | HR 64 | Resp 18 | Ht 69.0 in | Wt 172.0 lb

## 2012-05-07 DIAGNOSIS — Z954 Presence of other heart-valve replacement: Secondary | ICD-10-CM

## 2012-05-07 DIAGNOSIS — Z952 Presence of prosthetic heart valve: Secondary | ICD-10-CM

## 2012-05-07 DIAGNOSIS — I4891 Unspecified atrial fibrillation: Secondary | ICD-10-CM

## 2012-05-07 DIAGNOSIS — D509 Iron deficiency anemia, unspecified: Secondary | ICD-10-CM

## 2012-05-07 DIAGNOSIS — I4892 Unspecified atrial flutter: Secondary | ICD-10-CM

## 2012-05-07 DIAGNOSIS — Z7901 Long term (current) use of anticoagulants: Secondary | ICD-10-CM

## 2012-05-07 DIAGNOSIS — I059 Rheumatic mitral valve disease, unspecified: Secondary | ICD-10-CM

## 2012-05-07 LAB — BASIC METABOLIC PANEL
BUN: 16 mg/dL (ref 6–23)
Calcium: 9.2 mg/dL (ref 8.4–10.5)
Chloride: 105 mEq/L (ref 96–112)
Creatinine, Ser: 1.2 mg/dL (ref 0.4–1.5)

## 2012-05-07 NOTE — Assessment & Plan Note (Signed)
This was improved on the last visit. We will need to keep a close eye on this over time given his need for chronic warfarin anticoagulation

## 2012-05-07 NOTE — Assessment & Plan Note (Signed)
This is currently stable at the present time. We'll continue to monitor this.

## 2012-05-07 NOTE — Patient Instructions (Addendum)
Your physician recommends that you schedule a follow-up appointment in: 3 MONTHS  Your physician recommends that you continue on your current medications as directed. Please refer to the Current Medication list given to you today.  Your physician recommends that you have lab work today: BMP

## 2012-05-07 NOTE — Assessment & Plan Note (Signed)
The patient is currently in atrial flutter with a well-controlled ventricular response. He's not had any significant change in his symptoms. He is on chronic anticoagulation therapy. To continue his present time and remain in the Coumadin clinic. While we could convert him back to normal rhythm, I think he is tolerated rate control very nicely. We will see him back in followup in 3 months

## 2012-05-07 NOTE — Progress Notes (Signed)
HPI:  Patient is in for followup visit. He is doing well.  He has no major symptoms at the present time. He does feel short of breath with activity. His back still bothers him a bit, having had prior traumatic injury. His last CBC was improved. There no new clinical symptoms. His echocardiogram was recently repeated  Current Outpatient Prescriptions  Medication Sig Dispense Refill  . Cholecalciferol (VITAMIN D3) 1000 UNITS CAPS Take 1 capsule by mouth.        . ezetimibe (ZETIA) 10 MG tablet Take 10 mg by mouth daily.        Marland Kitchen FLUZONE HIGH-DOSE SUSP       . furosemide (LASIX) 20 MG tablet Take 20 mg by mouth daily.      Marland Kitchen HYDROcodone-acetaminophen (LORTAB) 10-500 MG per tablet Take 1 tablet by mouth every 6 (six) hours as needed. For pain.      . metoprolol tartrate (LOPRESSOR) 25 MG tablet Take 25 mg by mouth 2 (two) times daily.      Marland Kitchen omeprazole (PRILOSEC) 20 MG capsule Take 20 mg by mouth daily.       . potassium chloride SA (K-DUR,KLOR-CON) 20 MEQ tablet Take 20 mEq by mouth daily.      . ramipril (ALTACE) 5 MG capsule Take 5 mg by mouth daily.      Marland Kitchen tiotropium (SPIRIVA) 18 MCG inhalation capsule Place 1 capsule (18 mcg total) into inhaler and inhale daily.  30 capsule  11  . warfarin (COUMADIN) 5 MG tablet Take 5 mg by mouth daily.      Marland Kitchen zolpidem (AMBIEN) 5 MG tablet Take 5 mg by mouth at bedtime as needed. For insomnia.        Allergies  Allergen Reactions  . Rosuvastatin     Past Medical History  Diagnosis Date  . Iron deficiency anemia, unspecified   . Other diseases of lung, not elsewhere classified   . Other primary cardiomyopathies   . Personal history of other diseases of digestive system   . Personal history of surgery to heart and great vessels, presenting hazards to health   . Acute and subacute bacterial endocarditis   . Arrhythmia   . Cerebrovascular disease, unspecified   . Esophageal reflux   . Generalized osteoarthrosis, unspecified site   . Hypertension    . COPD (chronic obstructive pulmonary disease) 1999    Past Surgical History  Procedure Date  . 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 Video assisted thoracoscopy     left-and miniature muscle sparing thoracotomy for left lower lobectomy 04/29/2007  dr. Cornelius Moras    No family history on file.  History   Social History  . Marital Status: Divorced    Spouse Name: N/A    Number of Children: N/A  . Years of Education: N/A   Occupational History  . Not on file.   Social History Main Topics  . Smoking status: Former Smoker -- 2.0 packs/day for 40 years    Types: Cigarettes    Quit date: 07/30/2004  . Smokeless tobacco: Never Used  . Alcohol Use: Yes  . Drug Use: Not on file  . Sexually Active: Not on file   Other Topics Concern  . Not on file   Social History Narrative  . No narrative on file    ROS: Please see the HPI.  All other systems reviewed and negative.  PHYSICAL EXAM:  BP 133/71  Pulse 64  Resp 18  Ht 5\' 9"  (1.753 m)  Wt 172 lb (78.019 kg)  BMI 25.40 kg/m2  SpO2 98%  General: Well developed, well nourished, in no acute distress. Head:  Normocephalic and atraumatic. Neck: no JVD Lungs: Clear to auscultation and percussion. Heart: Irregular rhythm.  No def murmur.   Abdomen:  Normal bowel sounds; soft; non tender; no organomegaly Pulses: Pulses normal in all 4 extremities. Extremities: No clubbing or cyanosis. No edema. Neurologic: Alert and oriented x 3.  EKG:  Atrial flutter. Otherwise normal.  ECHO    Study Conclusions  - Left ventricle: The cavity size was normal. Wall thickness was increased in a pattern of mild LVH. The estimated ejection fraction was 55%. Regional wall motion abnormalities cannot be excluded. As noted before, there may be some inferolateral hypokinesis. - Aortic valve: Trivial regurgitation. - Mitral valve: Mitral prosthesis stable in place. No significant MR.  Mean gradient: 4mm Hg (D). - Left atrium: The atrium was severely dilated. - Right ventricle: The cavity size was mildly dilated. Systolic function was mildly reduced. - Right atrium: The atrium was mildly dilated. - Pulmonary arteries: PA peak pressure: 40mm Hg (S). - Impressions: NO SIGNIFICANT CHANGE SINCE THE STUDY OF 2012. Impressions:  - NO SIGNIFICANT CHANGE SINCE THE STUDY OF 2012.    ASSESSMENT AND PLAN:

## 2012-05-26 ENCOUNTER — Ambulatory Visit (INDEPENDENT_AMBULATORY_CARE_PROVIDER_SITE_OTHER): Payer: Medicare PPO | Admitting: Pharmacist

## 2012-05-26 DIAGNOSIS — I059 Rheumatic mitral valve disease, unspecified: Secondary | ICD-10-CM

## 2012-05-26 DIAGNOSIS — Z954 Presence of other heart-valve replacement: Secondary | ICD-10-CM

## 2012-05-26 DIAGNOSIS — Z7901 Long term (current) use of anticoagulants: Secondary | ICD-10-CM

## 2012-05-26 DIAGNOSIS — I4891 Unspecified atrial fibrillation: Secondary | ICD-10-CM

## 2012-05-26 DIAGNOSIS — Z952 Presence of prosthetic heart valve: Secondary | ICD-10-CM

## 2012-05-26 LAB — POCT INR: INR: 3.2

## 2012-06-16 ENCOUNTER — Ambulatory Visit (INDEPENDENT_AMBULATORY_CARE_PROVIDER_SITE_OTHER): Payer: Medicare PPO | Admitting: *Deleted

## 2012-06-16 DIAGNOSIS — Z7901 Long term (current) use of anticoagulants: Secondary | ICD-10-CM

## 2012-06-16 DIAGNOSIS — I059 Rheumatic mitral valve disease, unspecified: Secondary | ICD-10-CM

## 2012-06-16 DIAGNOSIS — Z954 Presence of other heart-valve replacement: Secondary | ICD-10-CM

## 2012-06-16 DIAGNOSIS — I4891 Unspecified atrial fibrillation: Secondary | ICD-10-CM

## 2012-06-16 DIAGNOSIS — Z952 Presence of prosthetic heart valve: Secondary | ICD-10-CM

## 2012-06-30 ENCOUNTER — Ambulatory Visit (INDEPENDENT_AMBULATORY_CARE_PROVIDER_SITE_OTHER): Payer: Medicare PPO | Admitting: Pharmacist

## 2012-06-30 DIAGNOSIS — Z954 Presence of other heart-valve replacement: Secondary | ICD-10-CM

## 2012-06-30 DIAGNOSIS — I059 Rheumatic mitral valve disease, unspecified: Secondary | ICD-10-CM

## 2012-06-30 DIAGNOSIS — Z7901 Long term (current) use of anticoagulants: Secondary | ICD-10-CM

## 2012-06-30 DIAGNOSIS — Z952 Presence of prosthetic heart valve: Secondary | ICD-10-CM

## 2012-06-30 DIAGNOSIS — I4891 Unspecified atrial fibrillation: Secondary | ICD-10-CM

## 2012-07-01 ENCOUNTER — Other Ambulatory Visit: Payer: Self-pay

## 2012-07-01 MED ORDER — METOPROLOL TARTRATE 25 MG PO TABS: 25.0000 mg | ORAL_TABLET | Freq: Two times a day (BID) | ORAL | Status: DC

## 2012-07-01 MED ORDER — POTASSIUM CHLORIDE CRYS ER 20 MEQ PO TBCR: 20.0000 meq | EXTENDED_RELEASE_TABLET | Freq: Every day | ORAL | Status: DC

## 2012-07-28 ENCOUNTER — Ambulatory Visit (INDEPENDENT_AMBULATORY_CARE_PROVIDER_SITE_OTHER): Payer: Medicare PPO | Admitting: *Deleted

## 2012-07-28 DIAGNOSIS — Z952 Presence of prosthetic heart valve: Secondary | ICD-10-CM

## 2012-07-28 DIAGNOSIS — I4891 Unspecified atrial fibrillation: Secondary | ICD-10-CM

## 2012-07-28 DIAGNOSIS — Z954 Presence of other heart-valve replacement: Secondary | ICD-10-CM

## 2012-07-28 DIAGNOSIS — I059 Rheumatic mitral valve disease, unspecified: Secondary | ICD-10-CM

## 2012-07-28 DIAGNOSIS — Z7901 Long term (current) use of anticoagulants: Secondary | ICD-10-CM

## 2012-07-29 ENCOUNTER — Other Ambulatory Visit: Payer: Self-pay | Admitting: *Deleted

## 2012-07-29 MED ORDER — WARFARIN SODIUM 5 MG PO TABS: 5.0000 mg | ORAL_TABLET | ORAL | Status: DC

## 2012-07-31 ENCOUNTER — Other Ambulatory Visit: Payer: Self-pay

## 2012-07-31 MED ORDER — FUROSEMIDE 20 MG PO TABS: 20.0000 mg | ORAL_TABLET | Freq: Every day | ORAL | Status: DC

## 2012-07-31 MED ORDER — RAMIPRIL 5 MG PO CAPS: 5.0000 mg | ORAL_CAPSULE | Freq: Every day | ORAL | Status: DC

## 2012-08-18 ENCOUNTER — Encounter: Payer: Self-pay | Admitting: Cardiology

## 2012-08-18 ENCOUNTER — Ambulatory Visit (INDEPENDENT_AMBULATORY_CARE_PROVIDER_SITE_OTHER): Payer: Medicare PPO | Admitting: Cardiology

## 2012-08-18 ENCOUNTER — Ambulatory Visit (INDEPENDENT_AMBULATORY_CARE_PROVIDER_SITE_OTHER): Payer: Medicare PPO | Admitting: Pharmacist

## 2012-08-18 VITALS — BP 115/74 | HR 70 | Ht 69.0 in | Wt 175.0 lb

## 2012-08-18 DIAGNOSIS — I10 Essential (primary) hypertension: Secondary | ICD-10-CM

## 2012-08-18 DIAGNOSIS — Z952 Presence of prosthetic heart valve: Secondary | ICD-10-CM

## 2012-08-18 DIAGNOSIS — Z9889 Other specified postprocedural states: Secondary | ICD-10-CM

## 2012-08-18 DIAGNOSIS — I4891 Unspecified atrial fibrillation: Secondary | ICD-10-CM

## 2012-08-18 DIAGNOSIS — R011 Cardiac murmur, unspecified: Secondary | ICD-10-CM

## 2012-08-18 DIAGNOSIS — Z954 Presence of other heart-valve replacement: Secondary | ICD-10-CM

## 2012-08-18 DIAGNOSIS — I059 Rheumatic mitral valve disease, unspecified: Secondary | ICD-10-CM

## 2012-08-18 DIAGNOSIS — Z7901 Long term (current) use of anticoagulants: Secondary | ICD-10-CM

## 2012-08-18 LAB — POCT INR: INR: 3.2

## 2012-08-18 NOTE — Assessment & Plan Note (Signed)
Controlled at present.  

## 2012-08-18 NOTE — Assessment & Plan Note (Signed)
Currently rate controlled 

## 2012-08-18 NOTE — Patient Instructions (Signed)
Your physician has requested that you have an echocardiogram. Echocardiography is a painless test that uses sound waves to create images of your heart. It provides your doctor with information about the size and shape of your heart and how well your heart's chambers and valves are working. This procedure takes approximately one hour. There are no restrictions for this procedure.  Your physician recommends that you schedule a follow-up appointment in: 3 WEEKS with Dr Riley Kill  Your physician recommends that you continue on your current medications as directed. Please refer to the Current Medication list given to you today.

## 2012-08-18 NOTE — Assessment & Plan Note (Signed)
Has new murmur, which is really new from the last visit.  Will reassess mitral valve, which is now nearly fifteen years in

## 2012-08-18 NOTE — Progress Notes (Signed)
HPI:  The patient is in for follow up.  He still has some shortness of breath.  Denies any chest pain.    Current Outpatient Prescriptions  Medication Sig Dispense Refill  . Cholecalciferol (VITAMIN D3) 1000 UNITS CAPS Take 1 capsule by mouth.        . ezetimibe (ZETIA) 10 MG tablet Take 10 mg by mouth daily.        Marland Kitchen FLUZONE HIGH-DOSE SUSP       . furosemide (LASIX) 20 MG tablet Take 1 tablet (20 mg total) by mouth daily.  30 tablet  4  . HYDROcodone-acetaminophen (LORTAB) 10-500 MG per tablet Take 1 tablet by mouth every 6 (six) hours as needed. For pain.      . metoprolol tartrate (LOPRESSOR) 25 MG tablet Take 1 tablet (25 mg total) by mouth 2 (two) times daily.  60 tablet  3  . omeprazole (PRILOSEC) 20 MG capsule Take 20 mg by mouth daily.       . potassium chloride SA (K-DUR,KLOR-CON) 20 MEQ tablet Take 1 tablet (20 mEq total) by mouth daily.  30 tablet  3  . ramipril (ALTACE) 5 MG capsule Take 1 capsule (5 mg total) by mouth daily.  30 capsule  3  . tiotropium (SPIRIVA) 18 MCG inhalation capsule Place 1 capsule (18 mcg total) into inhaler and inhale daily.  30 capsule  11  . warfarin (COUMADIN) 5 MG tablet Take 1 tablet (5 mg total) by mouth as directed.  45 tablet  3  . zolpidem (AMBIEN) 5 MG tablet Take 5 mg by mouth at bedtime as needed. For insomnia.        Allergies  Allergen Reactions  . Rosuvastatin     Past Medical History  Diagnosis Date  . Iron deficiency anemia, unspecified   . Other diseases of lung, not elsewhere classified   . Other primary cardiomyopathies   . Personal history of other diseases of digestive system   . Personal history of surgery to heart and great vessels, presenting hazards to health   . Acute and subacute bacterial endocarditis   . Arrhythmia   . Cerebrovascular disease, unspecified   . Esophageal reflux   . Generalized osteoarthrosis, unspecified site   . Hypertension   . COPD (chronic obstructive pulmonary disease) 1999    Past  Surgical History  Procedure Date  . 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 Video assisted thoracoscopy     left-and miniature muscle sparing thoracotomy for left lower lobectomy 04/29/2007  dr. Cornelius Moras    No family history on file.  History   Social History  . Marital Status: Divorced    Spouse Name: N/A    Number of Children: N/A  . Years of Education: N/A   Occupational History  . Not on file.   Social History Main Topics  . Smoking status: Former Smoker -- 2.0 packs/day for 40 years    Types: Cigarettes    Quit date: 07/30/2004  . Smokeless tobacco: Never Used  . Alcohol Use: Yes  . Drug Use: Not on file  . Sexually Active: Not on file   Other Topics Concern  . Not on file   Social History Narrative  . No narrative on file    ROS: Please see the HPI.  All other systems reviewed and negative.  PHYSICAL EXAM:  BP 115/74  Pulse 70  Ht 5\' 9"  (1.753 m)  Wt 175  lb (79.379 kg)  BMI 25.84 kg/m2  SpO2 97%  General: Well developed, well nourished, in no acute distress. Head:  Normocephalic and atraumatic. Neck: no JVD Lungs: Clear to auscultation and percussion. Heart: irregularly irregular rhythm.  Holosystolic murmur at the apex, new from las visit.   Abdomen:  Normal bowel sounds; soft; non tender; no organomegaly Pulses: Pulses normal in all 4 extremities. Extremities: No clubbing or cyanosis. No edema. Neurologic: Alert and oriented x 3.  EKG:  Atrial fib with controlled ventricular response.   ASSESSMENT AND PLAN:

## 2012-09-02 ENCOUNTER — Ambulatory Visit (HOSPITAL_COMMUNITY): Payer: Medicare PPO | Attending: Cardiology | Admitting: Radiology

## 2012-09-02 DIAGNOSIS — I428 Other cardiomyopathies: Secondary | ICD-10-CM | POA: Insufficient documentation

## 2012-09-02 DIAGNOSIS — I6529 Occlusion and stenosis of unspecified carotid artery: Secondary | ICD-10-CM | POA: Insufficient documentation

## 2012-09-02 DIAGNOSIS — R011 Cardiac murmur, unspecified: Secondary | ICD-10-CM

## 2012-09-02 DIAGNOSIS — J449 Chronic obstructive pulmonary disease, unspecified: Secondary | ICD-10-CM | POA: Insufficient documentation

## 2012-09-02 DIAGNOSIS — I10 Essential (primary) hypertension: Secondary | ICD-10-CM | POA: Insufficient documentation

## 2012-09-02 DIAGNOSIS — I4891 Unspecified atrial fibrillation: Secondary | ICD-10-CM | POA: Insufficient documentation

## 2012-09-02 DIAGNOSIS — I059 Rheumatic mitral valve disease, unspecified: Secondary | ICD-10-CM | POA: Insufficient documentation

## 2012-09-02 DIAGNOSIS — Z954 Presence of other heart-valve replacement: Secondary | ICD-10-CM | POA: Insufficient documentation

## 2012-09-02 DIAGNOSIS — J4489 Other specified chronic obstructive pulmonary disease: Secondary | ICD-10-CM | POA: Insufficient documentation

## 2012-09-02 DIAGNOSIS — I4892 Unspecified atrial flutter: Secondary | ICD-10-CM | POA: Insufficient documentation

## 2012-09-02 DIAGNOSIS — Z952 Presence of prosthetic heart valve: Secondary | ICD-10-CM

## 2012-09-02 NOTE — Progress Notes (Signed)
Echocardiogram performed.  

## 2012-09-10 ENCOUNTER — Ambulatory Visit: Payer: Medicare PPO | Admitting: Cardiology

## 2012-09-15 ENCOUNTER — Ambulatory Visit (INDEPENDENT_AMBULATORY_CARE_PROVIDER_SITE_OTHER): Payer: Medicare PPO | Admitting: Pharmacist

## 2012-09-15 ENCOUNTER — Encounter: Payer: Self-pay | Admitting: Cardiology

## 2012-09-15 ENCOUNTER — Ambulatory Visit (INDEPENDENT_AMBULATORY_CARE_PROVIDER_SITE_OTHER): Payer: Medicare PPO | Admitting: Cardiology

## 2012-09-15 VITALS — BP 130/84 | HR 66 | Ht 69.0 in | Wt 178.0 lb

## 2012-09-15 DIAGNOSIS — I4891 Unspecified atrial fibrillation: Secondary | ICD-10-CM

## 2012-09-15 DIAGNOSIS — Z954 Presence of other heart-valve replacement: Secondary | ICD-10-CM

## 2012-09-15 DIAGNOSIS — I059 Rheumatic mitral valve disease, unspecified: Secondary | ICD-10-CM

## 2012-09-15 DIAGNOSIS — Z7901 Long term (current) use of anticoagulants: Secondary | ICD-10-CM

## 2012-09-15 DIAGNOSIS — R0609 Other forms of dyspnea: Secondary | ICD-10-CM

## 2012-09-15 DIAGNOSIS — Z952 Presence of prosthetic heart valve: Secondary | ICD-10-CM

## 2012-09-15 DIAGNOSIS — I10 Essential (primary) hypertension: Secondary | ICD-10-CM

## 2012-09-15 DIAGNOSIS — R0600 Dyspnea, unspecified: Secondary | ICD-10-CM

## 2012-09-15 DIAGNOSIS — J984 Other disorders of lung: Secondary | ICD-10-CM

## 2012-09-15 DIAGNOSIS — R0602 Shortness of breath: Secondary | ICD-10-CM

## 2012-09-15 DIAGNOSIS — R0989 Other specified symptoms and signs involving the circulatory and respiratory systems: Secondary | ICD-10-CM

## 2012-09-15 NOTE — Patient Instructions (Addendum)
Non-Cardiac CT scanning, (CAT scanning), is a noninvasive, special x-ray that produces cross-sectional images of the body using x-rays and a computer. CT scans help physicians diagnose and treat medical conditions. For some CT exams, a contrast material is used to enhance visibility in the area of the body being studied. CT scans provide greater clarity and reveal more details than regular x-ray exams. (CT of CHEST Without Contrast)  Your physician recommends that you continue on your current medications as directed. Please refer to the Current Medication list given to you today.  Your physician recommends that you schedule a follow-up appointment in: 2-3 WEEKS with Dr Riley Kill

## 2012-09-15 NOTE — Progress Notes (Signed)
HPI:  The patient is in for follow up.  His echo report was reviewed, and discussed with the patient.  He complains of moderate shortness of breath with exertion.  His CT done back in August was abnormal and he has had follow up in primary care--see ER note.  He has had prior lung surgery for a nodule which the patient had forgotten.    Current Outpatient Prescriptions  Medication Sig Dispense Refill  . Cholecalciferol (VITAMIN D3) 1000 UNITS CAPS Take 1 capsule by mouth.        . ezetimibe (ZETIA) 10 MG tablet Take 10 mg by mouth daily.        Marland Kitchen FLUZONE HIGH-DOSE SUSP       . furosemide (LASIX) 20 MG tablet Take 1 tablet (20 mg total) by mouth daily.  30 tablet  4  . HYDROcodone-acetaminophen (LORTAB) 10-500 MG per tablet Take 1 tablet by mouth every 6 (six) hours as needed. For pain.      . metoprolol tartrate (LOPRESSOR) 25 MG tablet Take 1 tablet (25 mg total) by mouth 2 (two) times daily.  60 tablet  3  . omeprazole (PRILOSEC) 20 MG capsule Take 20 mg by mouth daily.       . potassium chloride SA (K-DUR,KLOR-CON) 20 MEQ tablet Take 1 tablet (20 mEq total) by mouth daily.  30 tablet  3  . ramipril (ALTACE) 5 MG capsule Take 1 capsule (5 mg total) by mouth daily.  30 capsule  3  . tiotropium (SPIRIVA) 18 MCG inhalation capsule Place 1 capsule (18 mcg total) into inhaler and inhale daily.  30 capsule  11  . warfarin (COUMADIN) 5 MG tablet Take 1 tablet (5 mg total) by mouth as directed.  45 tablet  3  . zolpidem (AMBIEN) 5 MG tablet Take 5 mg by mouth at bedtime as needed. For insomnia.       No current facility-administered medications for this visit.    Allergies  Allergen Reactions  . Rosuvastatin     Past Medical History  Diagnosis Date  . Iron deficiency anemia, unspecified   . Other diseases of lung, not elsewhere classified   . Other primary cardiomyopathies   . Personal history of other diseases of digestive system   . Personal history of surgery to heart and great  vessels, presenting hazards to health   . Acute and subacute bacterial endocarditis   . Arrhythmia   . Cerebrovascular disease, unspecified   . Esophageal reflux   . Generalized osteoarthrosis, unspecified site   . Hypertension   . COPD (chronic obstructive pulmonary disease) 1999    Past Surgical History  Procedure Laterality Date  . Mitral valve replacement      for bacterial endocarditis using bi-prosthetic tissue valve in 1999  . Video assisted thoracoscopy      left-and miniature muscle sparing thoracotomy for left lower lobectomy 04/29/2007  dr. Cornelius Moras    No family history on file.  History   Social History  . Marital Status: Divorced    Spouse Name: N/A    Number of Children: N/A  . Years of Education: N/A   Occupational History  . Not on file.   Social History Main Topics  . Smoking status: Former Smoker -- 2.00 packs/day for 40 years    Types: Cigarettes    Quit date: 07/30/2004  . Smokeless tobacco: Never Used  . Alcohol Use: Yes  . Drug Use: Not on file  . Sexually Active: Not on  file   Other Topics Concern  . Not on file   Social History Narrative  . No narrative on file    ROS: Please see the HPI.  All other systems reviewed and negative.  PHYSICAL EXAM:  BP 130/84  Pulse 66  Ht 5\' 9"  (1.753 m)  Wt 178 lb (80.74 kg)  BMI 26.27 kg/m2  SpO2 97%  General: Well developed, well nourished, in no acute distress. Head:  Normocephalic and atraumatic. Neck: no JVD Lungs: decrease BS at left base Heart: irregularly irregular with HSM at left apex and radiation to the axilla.   Abdomen:  Normal bowel sounds; soft; non tender; no organomegaly Pulses: Pulses normal in all 4 extremities. Extremities: No clubbing or cyanosis. No edema. Neurologic: Alert and oriented x 3.  EKG:  Atrial fib with controlled ventricular response.    ASSESSMENT AND PLAN:

## 2012-09-19 ENCOUNTER — Ambulatory Visit (INDEPENDENT_AMBULATORY_CARE_PROVIDER_SITE_OTHER)
Admission: RE | Admit: 2012-09-19 | Discharge: 2012-09-19 | Disposition: A | Discharge status: Home / Self Care | Payer: Medicaid Other | Source: Ambulatory Visit | Attending: Cardiology | Admitting: Cardiology

## 2012-09-19 DIAGNOSIS — I10 Essential (primary) hypertension: Secondary | ICD-10-CM

## 2012-09-19 DIAGNOSIS — I4891 Unspecified atrial fibrillation: Secondary | ICD-10-CM

## 2012-09-19 DIAGNOSIS — R0602 Shortness of breath: Secondary | ICD-10-CM

## 2012-09-25 ENCOUNTER — Other Ambulatory Visit: Payer: Self-pay | Admitting: Critical Care Medicine

## 2012-09-26 ENCOUNTER — Other Ambulatory Visit: Payer: Self-pay | Admitting: Critical Care Medicine

## 2012-09-26 DIAGNOSIS — R0600 Dyspnea, unspecified: Secondary | ICD-10-CM | POA: Insufficient documentation

## 2012-09-26 NOTE — Assessment & Plan Note (Signed)
Related to prior fall.  Recovered, although exer tol not as good since.

## 2012-09-26 NOTE — Assessment & Plan Note (Signed)
Stable status.

## 2012-09-26 NOTE — Assessment & Plan Note (Addendum)
He has remained short of breath to a moderate extent.  Prior CT scan was abnormal.  Had fu after ER with primary care.  Still has shortness of breath, and will recheck CT scan to assess pulmonary status.  Recent echo looked pretty good.

## 2012-09-26 NOTE — Assessment & Plan Note (Signed)
His rate remains controlled at the present time.  No changes in meds at this time.

## 2012-09-26 NOTE — Assessment & Plan Note (Signed)
See recent CT scans.  Repeat and should have pulmonary consult.

## 2012-09-29 ENCOUNTER — Ambulatory Visit (INDEPENDENT_AMBULATORY_CARE_PROVIDER_SITE_OTHER): Payer: Medicare PPO | Admitting: *Deleted

## 2012-09-29 ENCOUNTER — Ambulatory Visit (INDEPENDENT_AMBULATORY_CARE_PROVIDER_SITE_OTHER): Payer: Medicare PPO | Admitting: Cardiology

## 2012-09-29 ENCOUNTER — Telehealth: Payer: Self-pay | Admitting: Critical Care Medicine

## 2012-09-29 ENCOUNTER — Encounter: Payer: Self-pay | Admitting: Cardiology

## 2012-09-29 VITALS — BP 132/90 | HR 76 | Ht 69.0 in | Wt 182.0 lb

## 2012-09-29 DIAGNOSIS — I059 Rheumatic mitral valve disease, unspecified: Secondary | ICD-10-CM

## 2012-09-29 DIAGNOSIS — Z7901 Long term (current) use of anticoagulants: Secondary | ICD-10-CM

## 2012-09-29 DIAGNOSIS — J449 Chronic obstructive pulmonary disease, unspecified: Secondary | ICD-10-CM

## 2012-09-29 DIAGNOSIS — R911 Solitary pulmonary nodule: Secondary | ICD-10-CM | POA: Insufficient documentation

## 2012-09-29 DIAGNOSIS — I4891 Unspecified atrial fibrillation: Secondary | ICD-10-CM

## 2012-09-29 DIAGNOSIS — I10 Essential (primary) hypertension: Secondary | ICD-10-CM

## 2012-09-29 DIAGNOSIS — Z952 Presence of prosthetic heart valve: Secondary | ICD-10-CM

## 2012-09-29 DIAGNOSIS — Z9889 Other specified postprocedural states: Secondary | ICD-10-CM

## 2012-09-29 DIAGNOSIS — Z954 Presence of other heart-valve replacement: Secondary | ICD-10-CM

## 2012-09-29 LAB — POCT INR: INR: 5.2

## 2012-09-29 MED ORDER — TIOTROPIUM BROMIDE MONOHYDRATE 18 MCG IN CAPS: 18.0000 ug | ORAL_CAPSULE | Freq: Every day | RESPIRATORY_TRACT | Status: DC

## 2012-09-29 NOTE — Assessment & Plan Note (Signed)
Stable.  See echo report.  However, some likely increase in pressures overall.

## 2012-09-29 NOTE — Assessment & Plan Note (Signed)
May be predominant mechanism for dyspnea.  Will have him fu with Dr. Delford Field next month, then I will see him back after.

## 2012-09-29 NOTE — Patient Instructions (Addendum)
Your physician recommends that you schedule a follow-up appointment with Dr Riley Kill in 1 MONTH.  Your physician recommends that you continue on your current medications as directed. Please refer to the Current Medication list given to you today.  Your physician recommends that you return for lab work: CBC

## 2012-09-29 NOTE — Progress Notes (Signed)
HPI:  This very nice gentleman is in for followup visit. We went over in detail his CAT scan. He has a small nodule that we'll need followup approximately one year.  He had loss of thoracic spine height, and also emphysema. He scheduled to see Dr. Delford Field sometime next month. He does complain of moderate shortness of breath, and this may be the etiology.  Current Outpatient Prescriptions  Medication Sig Dispense Refill  . Cholecalciferol (VITAMIN D3) 1000 UNITS CAPS Take 1 capsule by mouth.        . ezetimibe (ZETIA) 10 MG tablet Take 10 mg by mouth daily.        Marland Kitchen FLUZONE HIGH-DOSE SUSP       . furosemide (LASIX) 20 MG tablet Take 1 tablet (20 mg total) by mouth daily.  30 tablet  4  . HYDROcodone-acetaminophen (LORTAB) 10-500 MG per tablet Take 1 tablet by mouth every 6 (six) hours as needed. For pain.      . metoprolol tartrate (LOPRESSOR) 25 MG tablet Take 1 tablet (25 mg total) by mouth 2 (two) times daily.  60 tablet  3  . omeprazole (PRILOSEC) 20 MG capsule Take 20 mg by mouth daily.       . potassium chloride SA (K-DUR,KLOR-CON) 20 MEQ tablet Take 1 tablet (20 mEq total) by mouth daily.  30 tablet  3  . ramipril (ALTACE) 5 MG capsule Take 1 capsule (5 mg total) by mouth daily.  30 capsule  3  . tiotropium (SPIRIVA) 18 MCG inhalation capsule Place 1 capsule (18 mcg total) into inhaler and inhale daily.  30 capsule  0  . warfarin (COUMADIN) 5 MG tablet Take 1 tablet (5 mg total) by mouth as directed.  45 tablet  3  . zolpidem (AMBIEN) 5 MG tablet Take 5 mg by mouth at bedtime as needed. For insomnia.       No current facility-administered medications for this visit.    Allergies  Allergen Reactions  . Rosuvastatin     Past Medical History  Diagnosis Date  . Iron deficiency anemia, unspecified   . Other diseases of lung, not elsewhere classified   . Other primary cardiomyopathies   . Personal history of other diseases of digestive system   . Personal history of surgery to heart  and great vessels, presenting hazards to health   . Acute and subacute bacterial endocarditis   . Arrhythmia   . Cerebrovascular disease, unspecified   . Esophageal reflux   . Generalized osteoarthrosis, unspecified site   . Hypertension   . COPD (chronic obstructive pulmonary disease) 1999    Past Surgical History  Procedure Laterality Date  . Mitral valve replacement      for bacterial endocarditis using bi-prosthetic tissue valve in 1999  . Video assisted thoracoscopy      left-and miniature muscle sparing thoracotomy for left lower lobectomy 04/29/2007  dr. Cornelius Moras    No family history on file.  History   Social History  . Marital Status: Divorced    Spouse Name: N/A    Number of Children: N/A  . Years of Education: N/A   Occupational History  . Not on file.   Social History Main Topics  . Smoking status: Former Smoker -- 2.00 packs/day for 40 years    Types: Cigarettes    Quit date: 07/30/2004  . Smokeless tobacco: Never Used  . Alcohol Use: Yes  . Drug Use: Not on file  . Sexually Active: Not on file  Other Topics Concern  . Not on file   Social History Narrative  . No narrative on file    ROS: Please see the HPI.  All other systems reviewed and negative.  PHYSICAL EXAM:  BP 132/90  Pulse 76  Ht 5\' 9"  (1.753 m)  Wt 182 lb (82.555 kg)  BMI 26.86 kg/m2  SpO2 98%  General: Well developed, well nourished, in no acute distress. Head:  Normocephalic and atraumatic. Neck: no JVD Lungs: decrease BS left base, prolonged expiration, and prolonged expiration.   Heart: irregularly irregular rhythm.   Pulses: Pulses normal in all 4 extremities. Extremities: No clubbing or cyanosis. No edema. Neurologic: Alert and oriented x 3.  EKG:  Atrial fib with controlled ventricular response  CT Scan  IMPRESSION: Status post left lower lobectomy.  3 mm nodule along the right major fissure. A single follow-up CT chest is suggested in 12 months document  stability.  Underlying moderate centrilobular emphysematous changes.   Original Report Authenticated By: Charline Bills, M.D.       Last Resulted: 09/19/12 11:55 AM               ECHOCARDIOGRAM  Study Conclusions  - Left ventricle: The cavity size was normal. Wall thickness was normal. Systolic function was normal. The estimated ejection fraction was in the range of 55% to 60%. Wall motion was normal; there were no regional wall motion abnormalities. - Mitral valve: A bioprosthesis was present. - Left atrium: The atrium was severely dilated. - Right ventricle: The cavity size was mildly dilated. Impressions:  - S/P MVR; mean gradient of 8 mmHg and MVA by pressure half time of 1.4 cm2.     ASSESSMENT AND PLAN:

## 2012-09-29 NOTE — Telephone Encounter (Signed)
Pt was last seen by PW on 08/28/11.   No pending appts  lmomtcb to schedule OV

## 2012-09-29 NOTE — Assessment & Plan Note (Signed)
See CT scan.  Will need one follow up study in one year.

## 2012-09-29 NOTE — Telephone Encounter (Signed)
Pt is aware that he will need an OV with PW in order to receive refills on his medication. He has been scheduled for 10/29/12 @ 4:30pm. I will send in enough medication to last him until then.

## 2012-09-29 NOTE — Assessment & Plan Note (Signed)
Rate is controlled at present.

## 2012-10-13 ENCOUNTER — Other Ambulatory Visit (INDEPENDENT_AMBULATORY_CARE_PROVIDER_SITE_OTHER): Payer: Medicare PPO

## 2012-10-13 ENCOUNTER — Ambulatory Visit (INDEPENDENT_AMBULATORY_CARE_PROVIDER_SITE_OTHER): Payer: Medicare PPO | Admitting: *Deleted

## 2012-10-13 DIAGNOSIS — I4891 Unspecified atrial fibrillation: Secondary | ICD-10-CM

## 2012-10-13 DIAGNOSIS — Z7901 Long term (current) use of anticoagulants: Secondary | ICD-10-CM

## 2012-10-13 DIAGNOSIS — J449 Chronic obstructive pulmonary disease, unspecified: Secondary | ICD-10-CM

## 2012-10-13 DIAGNOSIS — R911 Solitary pulmonary nodule: Secondary | ICD-10-CM

## 2012-10-13 DIAGNOSIS — Z9889 Other specified postprocedural states: Secondary | ICD-10-CM

## 2012-10-13 DIAGNOSIS — Z952 Presence of prosthetic heart valve: Secondary | ICD-10-CM

## 2012-10-13 DIAGNOSIS — I10 Essential (primary) hypertension: Secondary | ICD-10-CM

## 2012-10-13 DIAGNOSIS — I059 Rheumatic mitral valve disease, unspecified: Secondary | ICD-10-CM

## 2012-10-13 DIAGNOSIS — Z954 Presence of other heart-valve replacement: Secondary | ICD-10-CM

## 2012-10-13 LAB — CBC WITH DIFFERENTIAL/PLATELET
Basophils Relative: 0.3 % (ref 0.0–3.0)
Eosinophils Absolute: 0.1 10*3/uL (ref 0.0–0.7)
Eosinophils Relative: 1.5 % (ref 0.0–5.0)
Hemoglobin: 12.8 g/dL — ABNORMAL LOW (ref 13.0–17.0)
Lymphocytes Relative: 11.1 % — ABNORMAL LOW (ref 12.0–46.0)
Monocytes Relative: 12.9 % — ABNORMAL HIGH (ref 3.0–12.0)
Neutrophils Relative %: 74.2 % (ref 43.0–77.0)
RBC: 4.01 Mil/uL — ABNORMAL LOW (ref 4.22–5.81)
WBC: 8.4 10*3/uL (ref 4.5–10.5)

## 2012-10-24 ENCOUNTER — Other Ambulatory Visit: Payer: Self-pay | Admitting: *Deleted

## 2012-10-24 ENCOUNTER — Other Ambulatory Visit: Payer: Self-pay | Admitting: Critical Care Medicine

## 2012-10-24 MED ORDER — POTASSIUM CHLORIDE CRYS ER 20 MEQ PO TBCR: 20.0000 meq | EXTENDED_RELEASE_TABLET | Freq: Every day | ORAL | Status: DC

## 2012-10-27 ENCOUNTER — Ambulatory Visit (INDEPENDENT_AMBULATORY_CARE_PROVIDER_SITE_OTHER): Payer: Medicare PPO | Admitting: Pharmacist

## 2012-10-27 DIAGNOSIS — I059 Rheumatic mitral valve disease, unspecified: Secondary | ICD-10-CM

## 2012-10-27 DIAGNOSIS — Z952 Presence of prosthetic heart valve: Secondary | ICD-10-CM

## 2012-10-27 DIAGNOSIS — Z954 Presence of other heart-valve replacement: Secondary | ICD-10-CM

## 2012-10-27 DIAGNOSIS — I4891 Unspecified atrial fibrillation: Secondary | ICD-10-CM

## 2012-10-27 DIAGNOSIS — Z7901 Long term (current) use of anticoagulants: Secondary | ICD-10-CM

## 2012-10-27 LAB — POCT INR: INR: 4.3

## 2012-10-27 NOTE — Telephone Encounter (Signed)
Pt's last OV with PW 08/21/11; asked to f/u in 6 months Pt does have a pending OV with PW on 10/29/12 Rx sent with 1 fill.

## 2012-10-29 ENCOUNTER — Ambulatory Visit: Payer: Medicare PPO | Admitting: Critical Care Medicine

## 2012-10-30 ENCOUNTER — Other Ambulatory Visit: Payer: Self-pay | Admitting: *Deleted

## 2012-10-30 MED ORDER — EZETIMIBE 10 MG PO TABS: 10.0000 mg | ORAL_TABLET | Freq: Every day | ORAL | Status: DC

## 2012-11-07 ENCOUNTER — Encounter: Payer: Self-pay | Admitting: Critical Care Medicine

## 2012-11-07 ENCOUNTER — Ambulatory Visit (INDEPENDENT_AMBULATORY_CARE_PROVIDER_SITE_OTHER): Payer: Medicare PPO | Admitting: Critical Care Medicine

## 2012-11-07 VITALS — BP 126/80 | HR 69 | Temp 98.1°F | Ht 69.0 in | Wt 180.8 lb

## 2012-11-07 DIAGNOSIS — J984 Other disorders of lung: Secondary | ICD-10-CM

## 2012-11-07 DIAGNOSIS — J449 Chronic obstructive pulmonary disease, unspecified: Secondary | ICD-10-CM

## 2012-11-07 DIAGNOSIS — Z9889 Other specified postprocedural states: Secondary | ICD-10-CM

## 2012-11-07 MED ORDER — TIOTROPIUM BROMIDE MONOHYDRATE 18 MCG IN CAPS: ORAL_CAPSULE | RESPIRATORY_TRACT | Status: DC

## 2012-11-07 NOTE — Assessment & Plan Note (Signed)
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

## 2012-11-07 NOTE — Progress Notes (Signed)
Subjective:    Patient ID: Jeffery Copeland, male    DOB: 07/26/1946, 67 y.o.   MRN: [id number]  HPI  67 y.o. WM Fell had fx of T9 vertebrae.  Epidural hematoma.  No surgery.  Hx Copd.  Not smoking Since d/c about the same.  Pt notes dyspnea with exertion.  No mucus.  No real chest pain.  Back is still hurting.    08/28/11 Not seen since 8/12 Dyspnea is the same, ? If any worse.  Worse with exertion. No smoking since 2006  11/07/2012 Last seen 67/2013 LLL resection 2008 Dr Cornelius Moras CT Chest 2/14: 3mm   Since last ov , gets dyspneic with exertion. Notes some white mucus. No smoking. No chest pain  Past Medical History  Diagnosis Date  . Iron deficiency anemia, unspecified   . Other diseases of lung, not elsewhere classified   . Other primary cardiomyopathies   . Personal history of other diseases of digestive system   . Personal history of surgery to heart and great vessels, presenting hazards to health   . Acute and subacute bacterial endocarditis   . Arrhythmia   . Cerebrovascular disease, unspecified   . Esophageal reflux   . Generalized osteoarthrosis, unspecified site   . Hypertension   . COPD (chronic obstructive pulmonary disease) 1999  . Lung nodules 2008, 2014    LLL nodule removed 2008 with LLL lobectomy,  RLL nodule 3mm observation     No family history on file.   History   Social History  . Marital Status: Divorced    Spouse Name: N/A    Number of Children: N/A  . Years of Education: N/A   Occupational History  . Not on file.   Social History Main Topics  . Smoking status: Former Smoker -- 2.00 packs/day for 40 years    Types: Cigarettes    Quit date: 07/30/2004  . Smokeless tobacco: Never Used  . Alcohol Use: Yes  . Drug Use: Not on file  . Sexually Active: Not on file   Other Topics Concern  . Not on file   Social History Narrative  . No narrative on file     Allergies  Allergen Reactions  . Rosuvastatin      Outpatient Prescriptions Prior to  Visit  Medication Sig Dispense Refill  . Cholecalciferol (VITAMIN D3) 1000 UNITS CAPS Take 1 capsule by mouth daily.       Marland Kitchen ezetimibe (ZETIA) 10 MG tablet Take 1 tablet (10 mg total) by mouth daily.  30 tablet  6  . furosemide (LASIX) 20 MG tablet Take 1 tablet (20 mg total) by mouth daily.  30 tablet  4  . HYDROcodone-acetaminophen (LORTAB) 10-500 MG per tablet Take 1 tablet by mouth every 6 (six) hours as needed. For pain.      . metoprolol tartrate (LOPRESSOR) 25 MG tablet Take 1 tablet (25 mg total) by mouth 2 (two) times daily.  60 tablet  3  . omeprazole (PRILOSEC) 20 MG capsule Take 20 mg by mouth daily.       . potassium chloride SA (K-DUR,KLOR-CON) 20 MEQ tablet Take 1 tablet (20 mEq total) by mouth daily.  30 tablet  3  . ramipril (ALTACE) 5 MG capsule Take 1 capsule (5 mg total) by mouth daily.  30 capsule  3  . warfarin (COUMADIN) 5 MG tablet Take 1 tablet (5 mg total) by mouth as directed.  45 tablet  3  . zolpidem (AMBIEN) 5 MG tablet Take  5 mg by mouth at bedtime as needed. For insomnia.      Marland Kitchen SPIRIVA HANDIHALER 18 MCG inhalation capsule PLACE 1 CAPSULE (18 MCG TOTAL) INTO INHALER AND INHALE DAILY.  30 capsule  0  . FLUZONE HIGH-DOSE SUSP        No facility-administered medications prior to visit.     Review of Systems  Constitutional:   No  weight loss, night sweats,  Fevers, chills, fatigue, lassitude. HEENT:   No headaches,  Difficulty swallowing,  Tooth/dental problems,  Sore throat,                No sneezing, itching, ear ache, nasal congestion, post nasal drip,   CV:  No chest pain,  Orthopnea, PND, swelling in lower extremities, anasarca, dizziness, palpitations  GI  No heartburn, indigestion, abdominal pain, nausea, vomiting, diarrhea, change in bowel habits, loss of appetite  Resp: Notes shortness of breath with exertion not  at rest.  No excess mucus, no productive cough,  No non-productive cough,  No coughing up of blood.  No change in color of mucus.  No  wheezing.  No chest wall deformity  Skin: no rash or lesions.  GU: no dysuria, change in color of urine, no urgency or frequency.  No flank pain.  MS:  No joint pain or swelling.  No decreased range of motion.  No back pain.  Psych:  No change in mood or affect. No depression or anxiety.  No memory loss.     Objective:   Physical Exam  Filed Vitals:   11/07/12 1432  BP: 126/80  Pulse: 69  Temp: 98.1 F (36.7 C)  TempSrc: Oral  Height: 5\' 9"  (1.753 m)  Weight: 180 lb 12.8 oz (82.01 kg)  SpO2: 95%    Gen: Pleasant, well-nourished, in no distress,  normal affect  ENT: No lesions,  mouth clear,  oropharynx clear, no postnasal drip  Neck: No JVD, no TMG, no carotid bruits  Lungs: No use of accessory muscles, no dullness to percussion, distant BS  Cardiovascular: RRR, heart sounds normal, no murmur or gallops, no peripheral edema  Abdomen: soft and NT, no HSM,  BS normal  Musculoskeletal: No deformities, no cyanosis or clubbing  Neuro: alert, non focal  Skin: Warm, no lesions or rashes         Assessment & Plan:   COPD Copd with emphysema Stable lung nodule 3mm Plan Repeat CT chest in one year Stay on spiriva daily

## 2012-11-07 NOTE — Patient Instructions (Signed)
Stay on , refills sent  We will scan your lung again in 12 months Return 1 year

## 2012-11-07 NOTE — Assessment & Plan Note (Signed)
Stable nodule lower lobe Plan Repeat CT in 12months

## 2012-11-10 ENCOUNTER — Ambulatory Visit (INDEPENDENT_AMBULATORY_CARE_PROVIDER_SITE_OTHER): Payer: Medicare PPO | Admitting: Pharmacist

## 2012-11-10 ENCOUNTER — Ambulatory Visit (INDEPENDENT_AMBULATORY_CARE_PROVIDER_SITE_OTHER): Payer: Medicare PPO | Admitting: Cardiology

## 2012-11-10 VITALS — BP 118/64 | HR 66 | Resp 18 | Ht 69.0 in | Wt 181.0 lb

## 2012-11-10 DIAGNOSIS — Z7901 Long term (current) use of anticoagulants: Secondary | ICD-10-CM

## 2012-11-10 DIAGNOSIS — R911 Solitary pulmonary nodule: Secondary | ICD-10-CM

## 2012-11-10 DIAGNOSIS — Z952 Presence of prosthetic heart valve: Secondary | ICD-10-CM

## 2012-11-10 DIAGNOSIS — S069X9S Unspecified intracranial injury with loss of consciousness of unspecified duration, sequela: Secondary | ICD-10-CM

## 2012-11-10 DIAGNOSIS — I4891 Unspecified atrial fibrillation: Secondary | ICD-10-CM

## 2012-11-10 DIAGNOSIS — Z954 Presence of other heart-valve replacement: Secondary | ICD-10-CM

## 2012-11-10 DIAGNOSIS — I059 Rheumatic mitral valve disease, unspecified: Secondary | ICD-10-CM

## 2012-11-10 DIAGNOSIS — S069XAS Unspecified intracranial injury with loss of consciousness status unknown, sequela: Secondary | ICD-10-CM

## 2012-11-10 DIAGNOSIS — Z9889 Other specified postprocedural states: Secondary | ICD-10-CM

## 2012-11-10 DIAGNOSIS — Z8679 Personal history of other diseases of the circulatory system: Secondary | ICD-10-CM

## 2012-11-10 LAB — BASIC METABOLIC PANEL
BUN: 16 mg/dL (ref 6–23)
Calcium: 8.8 mg/dL (ref 8.4–10.5)
Creatinine, Ser: 1.3 mg/dL (ref 0.4–1.5)
GFR: 61.28 mL/min (ref >60.00)

## 2012-11-10 LAB — POCT INR: INR: 2.8

## 2012-11-10 NOTE — Patient Instructions (Addendum)
Your physician recommends that you schedule a follow-up appointment in: 2 months with Dr McLean.   

## 2012-11-10 NOTE — Progress Notes (Signed)
HPI:  He is about the same.  Modest shortness of breath, but no progression.  Saw Dr. Delford Field.  I reminded the patient of need for one year follow up.  Echo reviewed---has prom murmur but no obvious MR.     Current Outpatient Prescriptions  Medication Sig Dispense Refill  . Cholecalciferol (VITAMIN D3) 1000 UNITS CAPS Take 1 capsule by mouth daily.       Marland Kitchen ezetimibe (ZETIA) 10 MG tablet Take 1 tablet (10 mg total) by mouth daily.  30 tablet  6  . furosemide (LASIX) 20 MG tablet Take 1 tablet (20 mg total) by mouth daily.  30 tablet  4  . HYDROcodone-acetaminophen (LORTAB) 10-500 MG per tablet Take 1 tablet by mouth every 6 (six) hours as needed. For pain.      . metoprolol tartrate (LOPRESSOR) 25 MG tablet Take 1 tablet (25 mg total) by mouth 2 (two) times daily.  60 tablet  3  . omeprazole (PRILOSEC) 20 MG capsule Take 20 mg by mouth daily.       . potassium chloride SA (K-DUR,KLOR-CON) 20 MEQ tablet Take 1 tablet (20 mEq total) by mouth daily.  30 tablet  3  . ramipril (ALTACE) 5 MG capsule Take 1 capsule (5 mg total) by mouth daily.  30 capsule  3  . tiotropium (SPIRIVA HANDIHALER) 18 MCG inhalation capsule PLACE 1 CAPSULE (18 MCG TOTAL) INTO INHALER AND INHALE DAILY.  30 capsule  11  . warfarin (COUMADIN) 5 MG tablet Take 1 tablet (5 mg total) by mouth as directed.  45 tablet  3  . zolpidem (AMBIEN) 5 MG tablet Take 5 mg by mouth at bedtime as needed. For insomnia.       No current facility-administered medications for this visit.    Allergies  Allergen Reactions  . Rosuvastatin     Past Medical History  Diagnosis Date  . Iron deficiency anemia, unspecified   . Other diseases of lung, not elsewhere classified   . Other primary cardiomyopathies   . Personal history of other diseases of digestive system   . Personal history of surgery to heart and great vessels, presenting hazards to health   . Acute and subacute bacterial endocarditis   . Arrhythmia   . Cerebrovascular disease,  unspecified   . Esophageal reflux   . Generalized osteoarthrosis, unspecified site   . Hypertension   . COPD (chronic obstructive pulmonary disease) 1999  . Lung nodules 2008, 2014    LLL nodule removed 2008 with LLL lobectomy,  RLL nodule 3mm observation    Past Surgical History  Procedure Laterality Date  . Mitral valve replacement      for bacterial endocarditis using bi-prosthetic tissue valve in 1999  . Video assisted thoracoscopy  2008    LLL resction for LLL nodule benign eft-and miniature muscle sparing thoracotomy for left lower lobectomy 04/29/2007  dr. Cornelius Moras    No family history on file.  History   Social History  . Marital Status: Divorced    Spouse Name: N/A    Number of Children: N/A  . Years of Education: N/A   Occupational History  . Not on file.   Social History Main Topics  . Smoking status: Former Smoker -- 2.00 packs/day for 40 years    Types: Cigarettes    Quit date: 07/30/2004  . Smokeless tobacco: Never Used  . Alcohol Use: Yes  . Drug Use: Not on file  . Sexually Active: Not on file   Other  Topics Concern  . Not on file   Social History Narrative  . No narrative on file    ROS: Please see the HPI.  All other systems reviewed and negative.  PHYSICAL EXAM:  BP 118/64  Pulse 66  Resp 18  Ht 5\' 9"  (1.753 m)  Wt 181 lb (82.101 kg)  BMI 26.72 kg/m2  General: Well developed, well nourished, in no acute distress. Head:  Normocephalic and atraumatic. Neck: no JVD Lungs: decrease BS in the left base.   Heart: irregularly irregular rhythm with holosystolic murmur at the apex.   Pulses: Pulses normal in all 4 extremities. Extremities: No clubbing or cyanosis. No edema. Neurologic: Alert and oriented x 3.  EKG:  None today.    ASSESSMENT AND PLAN:  1.  Abnormal CT--follow up with Dr. Delford Field 2.  Atrial fib  -  Rate controlled and on meds for now.  3.  SP MVR tissue valve 1999---mod MS.  Continue to monitor 4.  Follow up DM in two  months.

## 2012-11-15 NOTE — Assessment & Plan Note (Signed)
I referred him to Dr. Delford Field.  He will need a follow up CT in one year as noted.

## 2012-11-15 NOTE — Assessment & Plan Note (Signed)
Has prominent murmur by exam.  However TTE does not suggest severe MR.  ? Perivalvular leak.  Will have fu with Dr. Laurel Dimmer of TEE or long term monitoring.  He is stable at present.

## 2012-11-15 NOTE — Assessment & Plan Note (Signed)
Occurred after 2012 fall.  He has stabilized and is back on warfarin.

## 2012-11-15 NOTE — Assessment & Plan Note (Signed)
Remains in atrial fib on warfarin anticoagulation.

## 2012-11-27 ENCOUNTER — Telehealth: Payer: Self-pay | Admitting: *Deleted

## 2012-11-27 MED ORDER — FUROSEMIDE 20 MG PO TABS: 20.0000 mg | ORAL_TABLET | Freq: Every day | ORAL | Status: DC

## 2012-11-27 MED ORDER — RAMIPRIL 5 MG PO CAPS: 5.0000 mg | ORAL_CAPSULE | Freq: Every day | ORAL | Status: DC

## 2012-11-27 NOTE — Telephone Encounter (Signed)
Pt transferred to triage stating he can't pick up his meds because they arent being refilled, pharmacy has tried to make contact and he has been out of med for several days. meds were refilled. I will forward to Doylene Bode nurse manager for further review.

## 2012-11-28 ENCOUNTER — Other Ambulatory Visit: Payer: Self-pay | Admitting: *Deleted

## 2012-11-28 MED ORDER — METOPROLOL TARTRATE 25 MG PO TABS: 25.0000 mg | ORAL_TABLET | Freq: Two times a day (BID) | ORAL | Status: DC

## 2012-12-01 ENCOUNTER — Ambulatory Visit (INDEPENDENT_AMBULATORY_CARE_PROVIDER_SITE_OTHER): Payer: Medicare PPO

## 2012-12-01 DIAGNOSIS — I059 Rheumatic mitral valve disease, unspecified: Secondary | ICD-10-CM

## 2012-12-01 DIAGNOSIS — Z7901 Long term (current) use of anticoagulants: Secondary | ICD-10-CM

## 2012-12-01 DIAGNOSIS — I4891 Unspecified atrial fibrillation: Secondary | ICD-10-CM

## 2012-12-01 DIAGNOSIS — Z954 Presence of other heart-valve replacement: Secondary | ICD-10-CM

## 2012-12-01 DIAGNOSIS — Z952 Presence of prosthetic heart valve: Secondary | ICD-10-CM

## 2012-12-01 LAB — POCT INR: INR: 2.9

## 2012-12-22 ENCOUNTER — Encounter (HOSPITAL_COMMUNITY): Payer: Self-pay | Admitting: Adult Health

## 2012-12-22 ENCOUNTER — Emergency Department (HOSPITAL_COMMUNITY)
Admission: EM | Admit: 2012-12-22 | Discharge: 2012-12-22 | Disposition: A | Discharge status: Home / Self Care | Payer: Medicare PPO | Attending: Emergency Medicine | Admitting: Emergency Medicine

## 2012-12-22 DIAGNOSIS — Z9889 Other specified postprocedural states: Secondary | ICD-10-CM | POA: Insufficient documentation

## 2012-12-22 DIAGNOSIS — Z8739 Personal history of other diseases of the musculoskeletal system and connective tissue: Secondary | ICD-10-CM | POA: Insufficient documentation

## 2012-12-22 DIAGNOSIS — Y929 Unspecified place or not applicable: Secondary | ICD-10-CM | POA: Insufficient documentation

## 2012-12-22 DIAGNOSIS — S51812A Laceration without foreign body of left forearm, initial encounter: Secondary | ICD-10-CM

## 2012-12-22 DIAGNOSIS — Z79899 Other long term (current) drug therapy: Secondary | ICD-10-CM | POA: Insufficient documentation

## 2012-12-22 DIAGNOSIS — I10 Essential (primary) hypertension: Secondary | ICD-10-CM | POA: Insufficient documentation

## 2012-12-22 DIAGNOSIS — Z8679 Personal history of other diseases of the circulatory system: Secondary | ICD-10-CM | POA: Insufficient documentation

## 2012-12-22 DIAGNOSIS — Y9389 Activity, other specified: Secondary | ICD-10-CM | POA: Insufficient documentation

## 2012-12-22 DIAGNOSIS — Z87891 Personal history of nicotine dependence: Secondary | ICD-10-CM | POA: Insufficient documentation

## 2012-12-22 DIAGNOSIS — Z23 Encounter for immunization: Secondary | ICD-10-CM | POA: Insufficient documentation

## 2012-12-22 DIAGNOSIS — W260XXA Contact with knife, initial encounter: Secondary | ICD-10-CM | POA: Insufficient documentation

## 2012-12-22 DIAGNOSIS — Z7901 Long term (current) use of anticoagulants: Secondary | ICD-10-CM | POA: Insufficient documentation

## 2012-12-22 DIAGNOSIS — J4489 Other specified chronic obstructive pulmonary disease: Secondary | ICD-10-CM | POA: Insufficient documentation

## 2012-12-22 DIAGNOSIS — Z8709 Personal history of other diseases of the respiratory system: Secondary | ICD-10-CM | POA: Insufficient documentation

## 2012-12-22 DIAGNOSIS — S51809A Unspecified open wound of unspecified forearm, initial encounter: Secondary | ICD-10-CM | POA: Insufficient documentation

## 2012-12-22 DIAGNOSIS — J449 Chronic obstructive pulmonary disease, unspecified: Secondary | ICD-10-CM | POA: Insufficient documentation

## 2012-12-22 MED ORDER — TETANUS-DIPHTH-ACELL PERTUSSIS 5-2.5-18.5 LF-MCG/0.5 IM SUSP
0.5000 mL | Freq: Once | INTRAMUSCULAR | Status: AC
  Administered 2012-12-22: 0.5 mL via INTRAMUSCULAR
  Filled 2012-12-22 (×2): qty 0.5

## 2012-12-22 NOTE — ED Notes (Addendum)
pesents with laceration to left lateral wrist. approx 1.5 inches, cut self with a knife while trimming a branch off a tree. CMS intact, bleeding controlled.  Unknown last tetanus. Pt takes coumadin.

## 2012-12-22 NOTE — ED Provider Notes (Addendum)
History     CSN: [id number]  Arrival date & time 12/22/12  4098   First MD Initiated Contact with Patient 12/22/12 1843      Chief Complaint  Patient presents with  . Laceration    (Consider location/radiation/quality/duration/timing/severity/associated sxs/prior treatment) Patient is a 67 y.o. male presenting with skin laceration. The history is provided by the patient.  Laceration Location:  Shoulder/arm Shoulder/arm laceration location:  L forearm Length (cm):  5 Depth:  Cutaneous Quality: straight   Bleeding: controlled   Time since incident:  1 hour Laceration mechanism:  Knife Pain details:    Quality:  Dull   Severity:  Mild   Timing:  Constant   Progression:  Unchanged Foreign body present:  No foreign bodies Relieved by:  Pressure Worsened by:  Nothing tried Tetanus status:  Out of date   Past Medical History  Diagnosis Date  . Iron deficiency anemia, unspecified   . Other diseases of lung, not elsewhere classified   . Other primary cardiomyopathies   . Personal history of other diseases of digestive system   . Personal history of surgery to heart and great vessels, presenting hazards to health   . Acute and subacute bacterial endocarditis   . Arrhythmia   . Cerebrovascular disease, unspecified   . Esophageal reflux   . Generalized osteoarthrosis, unspecified site   . Hypertension   . COPD (chronic obstructive pulmonary disease) 1999  . Lung nodules 2008, 2014    LLL nodule removed 2008 with LLL lobectomy,  RLL nodule 3mm observation    Past Surgical History  Procedure Laterality Date  . Mitral valve replacement      for bacterial endocarditis using bi-prosthetic tissue valve in 1999  . Video assisted thoracoscopy  2008    LLL resction for LLL nodule benign eft-and miniature muscle sparing thoracotomy for left lower lobectomy 04/29/2007  dr. Cornelius Moras    History reviewed. No pertinent family history.  History  Substance Use Topics  . Smoking  status: Former Smoker -- 2.00 packs/day for 40 years    Types: Cigarettes    Quit date: 07/30/2004  . Smokeless tobacco: Never Used  . Alcohol Use: Yes      Review of Systems  All other systems reviewed and are negative.    Allergies  Rosuvastatin  Home Medications   Current Outpatient Rx  Name  Route  Sig  Dispense  Refill  . Cholecalciferol (VITAMIN D3) 1000 UNITS CAPS   Oral   Take 1 capsule by mouth daily.          Marland Kitchen ezetimibe (ZETIA) 10 MG tablet   Oral   Take 1 tablet (10 mg total) by mouth daily.   30 tablet   6   . furosemide (LASIX) 20 MG tablet   Oral   Take 1 tablet (20 mg total) by mouth daily.   30 tablet   6   . HYDROcodone-acetaminophen (LORTAB) 10-500 MG per tablet   Oral   Take 1 tablet by mouth every 6 (six) hours as needed. For pain.         . metoprolol tartrate (LOPRESSOR) 25 MG tablet   Oral   Take 1 tablet (25 mg total) by mouth 2 (two) times daily.   60 tablet   3   . omeprazole (PRILOSEC) 20 MG capsule   Oral   Take 20 mg by mouth daily.          . potassium chloride SA (K-DUR,KLOR-CON) 20 MEQ tablet  Oral   Take 1 tablet (20 mEq total) by mouth daily.   30 tablet   3   . ramipril (ALTACE) 5 MG capsule   Oral   Take 1 capsule (5 mg total) by mouth daily.   30 capsule   6   . tiotropium (SPIRIVA HANDIHALER) 18 MCG inhalation capsule      PLACE 1 CAPSULE (18 MCG TOTAL) INTO INHALER AND INHALE DAILY.   30 capsule   11   . warfarin (COUMADIN) 5 MG tablet   Oral   Take 1 tablet (5 mg total) by mouth as directed.   45 tablet   3     45 tablets is 30 day supply   . zolpidem (AMBIEN) 5 MG tablet   Oral   Take 5 mg by mouth at bedtime as needed. For insomnia.           BP 121/77  Pulse 81  Temp(Src) 98.3 F (36.8 C) (Oral)  Resp 18  SpO2 97%  Physical Exam  Nursing note and vitals reviewed. Constitutional: He is oriented to person, place, and time. He appears well-developed and well-nourished. No  distress.  HENT:  Head: Normocephalic and atraumatic.  Cardiovascular: Normal rate and intact distal pulses.   Pulmonary/Chest: Effort normal.  Musculoskeletal: He exhibits tenderness.       Left forearm: He exhibits tenderness and laceration.       Arms: Neurological: He is alert and oriented to person, place, and time. He has normal strength. No sensory deficit.  Skin: Skin is warm and dry. No rash noted. No erythema.    ED Course  Procedures (including critical care time)  Labs Reviewed - No data to display No results found.  LACERATION REPAIR Performed by: Gwyneth Sprout Authorized byGwyneth Sprout Consent: Verbal consent obtained. Risks and benefits: risks, benefits and alternatives were discussed Consent given by: patient Patient identity confirmed: provided demographic data Prepped and Draped in normal sterile fashion Wound explored  Laceration Location: Left forearm  Laceration Length: 5 cm  No Foreign Bodies seen or palpated  Anesthesia: local infiltration  Local anesthetic: lidocaine 2 % with epinephrine  Anesthetic total: 4 ml  Irrigation method: syringe Amount of cleaning: standard  Skin closure: 4.0 Vicryl   Number of sutures: 10   Technique: Simple interrupted and running   Patient tolerance: Patient tolerated the procedure well with no immediate complications.   1. Forearm laceration, left, initial encounter       MDM   Patient with laceration to the left arm while cutting weeds today. Neurovascularly intact and wound repair as above. Shock will be updated and patient was discharged home. He states his Coumadin was last checked at the beginning of this month and it was within normal range. He gets another check done next week.        Gwyneth Sprout, MD 12/22/12 1918  Gwyneth Sprout, MD 12/22/12 1919

## 2012-12-29 ENCOUNTER — Ambulatory Visit (INDEPENDENT_AMBULATORY_CARE_PROVIDER_SITE_OTHER): Payer: Medicare PPO | Admitting: *Deleted

## 2012-12-29 DIAGNOSIS — Z7901 Long term (current) use of anticoagulants: Secondary | ICD-10-CM

## 2012-12-29 DIAGNOSIS — Z952 Presence of prosthetic heart valve: Secondary | ICD-10-CM

## 2012-12-29 DIAGNOSIS — I059 Rheumatic mitral valve disease, unspecified: Secondary | ICD-10-CM

## 2012-12-29 DIAGNOSIS — I4891 Unspecified atrial fibrillation: Secondary | ICD-10-CM

## 2012-12-29 DIAGNOSIS — Z954 Presence of other heart-valve replacement: Secondary | ICD-10-CM

## 2012-12-29 LAB — POCT INR: INR: 2.8

## 2013-01-17 ENCOUNTER — Other Ambulatory Visit: Payer: Self-pay | Admitting: Cardiology

## 2013-01-21 ENCOUNTER — Other Ambulatory Visit: Payer: Self-pay | Admitting: Cardiology

## 2013-01-28 ENCOUNTER — Encounter: Payer: Self-pay | Admitting: Cardiology

## 2013-01-28 ENCOUNTER — Ambulatory Visit (INDEPENDENT_AMBULATORY_CARE_PROVIDER_SITE_OTHER): Payer: Medicare PPO | Admitting: Cardiology

## 2013-01-28 ENCOUNTER — Encounter: Payer: Self-pay | Admitting: *Deleted

## 2013-01-28 ENCOUNTER — Ambulatory Visit (INDEPENDENT_AMBULATORY_CARE_PROVIDER_SITE_OTHER): Payer: Medicare PPO | Admitting: *Deleted

## 2013-01-28 VITALS — BP 126/66 | HR 72 | Ht 69.0 in | Wt 175.1 lb

## 2013-01-28 DIAGNOSIS — Z952 Presence of prosthetic heart valve: Secondary | ICD-10-CM

## 2013-01-28 DIAGNOSIS — I340 Nonrheumatic mitral (valve) insufficiency: Secondary | ICD-10-CM

## 2013-01-28 DIAGNOSIS — I059 Rheumatic mitral valve disease, unspecified: Secondary | ICD-10-CM

## 2013-01-28 DIAGNOSIS — Z7901 Long term (current) use of anticoagulants: Secondary | ICD-10-CM

## 2013-01-28 DIAGNOSIS — Z954 Presence of other heart-valve replacement: Secondary | ICD-10-CM

## 2013-01-28 DIAGNOSIS — R0600 Dyspnea, unspecified: Secondary | ICD-10-CM

## 2013-01-28 DIAGNOSIS — I4891 Unspecified atrial fibrillation: Secondary | ICD-10-CM

## 2013-01-28 DIAGNOSIS — R0609 Other forms of dyspnea: Secondary | ICD-10-CM

## 2013-01-28 DIAGNOSIS — R0989 Other specified symptoms and signs involving the circulatory and respiratory systems: Secondary | ICD-10-CM

## 2013-01-28 NOTE — Patient Instructions (Addendum)
Your physician has requested that you have a TEE. During a TEE, sound waves are used to create images of your heart. It provides your doctor with information about the size and shape of your heart and how well your heart's chambers and valves are working. In this test, a transducer is attached to the end of a flexible tube that's guided down your throat and into your esophagus (the tube leading from you mouth to your stomach) to get a more detailed image of your heart. You are not awake for the procedure. Please see the instruction sheet given to you today. For further information please visit [email or web address]   Your physician wants you to follow-up in: 4 months with Dr Shirlee Latch. (November 2014).  You will receive a reminder letter in the mail two months in advance. If you don't receive a letter, please call our office to schedule the follow-up appointment.

## 2013-01-28 NOTE — Progress Notes (Signed)
Patient ID: Jeffery Copeland, male   DOB: 06/29/1946, 66 y.o.   MRN: [id number] PCP: Dr. Shelton  66 yo with history of endocarditis necessitating bioprosthetic mitral valve replacement in 1999 and chronic atrial fibrillation presents for cardiology followup.  He has been seen by Dr. Stuckey in the past and is seen by me for the first time today.  I have reviewed all his old records.  For the last approximately 2 years, he has had significant exertional dyspnea.  He is short of breath after walking about 1/4 mile.  He used to be able to walk as much as he wanted without dyspnea.  He is short of breath with steps.  No orthopnea, PND, or chest pain.  Dr. Stuckey noted a systolic murmur at a prior visit and ordered a TTE to look for MR.  I reviewed the echo today.  This did not show any definite MR but there was significant shadowing from the bioprosthetic valve.    ECG: Atrial fibrillation, rate 72  Labs (4/14): K 4.1, creatinine 1.3  PMH: 1. Fe deficiency anemia 2. 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  Echo (2/14) with EF 55-60%, bioprosthetic mitral valve with mean gradient 8 mmHg, no definite MR noted though there was significant shadowing, severe LAE.  3. COPD: Followed by Dr. Wright. 4. GERD 5. Lung nodules: LLL nodule removed in 2008 by left lower lobectomy.  RLL nodule, currently observing.  6. Atrial fibrillation: Chronic, on coumadin. 7. Chronic diastolic CHF  SH: Divorced, lives alone in [place].  Quit smoking in 2006.    FH: No premature CAD.  ROS: All systems reviewed and negative except as per HPI.   Current Outpatient Prescriptions  Medication Sig Dispense Refill  . Cholecalciferol (VITAMIN D3) 1000 UNITS CAPS Take 1 capsule by mouth daily.       . ezetimibe (ZETIA) 10 MG tablet Take 1 tablet (10 mg total) by mouth daily.  30 tablet  6  . furosemide (LASIX) 20 MG tablet Take 1 tablet (20 mg total) by mouth daily.  30 tablet  6  .  HYDROcodone-acetaminophen (LORTAB) 10-500 MG per tablet Take 1 tablet by mouth every 6 (six) hours as needed. For pain.      . metoprolol tartrate (LOPRESSOR) 25 MG tablet Take 1 tablet (25 mg total) by mouth 2 (two) times daily.  60 tablet  3  . omeprazole (PRILOSEC) 20 MG capsule Take 20 mg by mouth daily.       . potassium chloride SA (K-DUR,KLOR-CON) 20 MEQ tablet Take 1 tablet (20 mEq total) by mouth daily.  30 tablet  3  . ramipril (ALTACE) 5 MG capsule Take 1 capsule (5 mg total) by mouth daily.  30 capsule  6  . tiotropium (SPIRIVA HANDIHALER) 18 MCG inhalation capsule PLACE 1 CAPSULE (18 MCG TOTAL) INTO INHALER AND INHALE DAILY.  30 capsule  11  . warfarin (COUMADIN) 5 MG tablet TAKE 1 TABLET BY MOUTH AS DIRECTED  45 tablet  3  . zolpidem (AMBIEN) 5 MG tablet Take 5 mg by mouth at bedtime as needed. For insomnia.       No current facility-administered medications for this visit.    BP 126/66  Pulse 72  Ht 5' 9" (1.753 m)  Wt 175 lb 2 oz (79.436 kg)  BMI 25.85 kg/m2 General: NAD Neck: No JVD, no thyromegaly or thyroid nodule.  Lungs: Clear to auscultation bilaterally with normal respiratory effort. CV: Nondisplaced PMI.    Heart regular S1/S2, +S4, 3/6 HSM at apex.  No peripheral edema.  No carotid bruit.  Normal pedal pulses.  Abdomen: Soft, nontender, no hepatosplenomegaly, no distention.  Skin: Intact without lesions or rashes.  Neurologic: Alert and oriented x 3.  Psych: Normal affect. Extremities: No clubbing or cyanosis.   Assessment/Plan:  1. Exertional dyspnea: This has been chronic for around 2 years.  It may be due to COPD.  However, I also hear a significant mitral area systolic murmur that I cannot explain based on the recent TTE.  He does not look volume overloaded on exam.  He has had no exertional chest pain.  As a first step, I think that we need to get a closer look at the bioprosthetic mitral valve.  There is considerable shadowing on the transthoracic echo and I  cannot fully rule out significant mitral regurgitation.  I will do a TEE to look for MR.   2. Bioprosthetic mitral valve: There did not appear to be significant bioprosthetic stenosis on most recent echo. As above, plan for TEE to assess for MR.  3. Atrial fibrillation: Chronic, good rate control on current dose of metoprolol.  Continue anticoagulation with coumadin given valvular atrial fibrillation.   Jeffery Copeland 01/28/2013 10:02 PM    

## 2013-02-12 ENCOUNTER — Encounter (HOSPITAL_COMMUNITY): Payer: Self-pay | Admitting: *Deleted

## 2013-02-12 ENCOUNTER — Telehealth: Payer: Self-pay | Admitting: *Deleted

## 2013-02-12 ENCOUNTER — Encounter: Payer: Self-pay | Admitting: *Deleted

## 2013-02-12 ENCOUNTER — Ambulatory Visit (HOSPITAL_COMMUNITY)
Admission: RE | Admit: 2013-02-12 | Discharge: 2013-02-12 | Disposition: A | Discharge status: Home / Self Care | Payer: Medicare PPO | Source: Ambulatory Visit | Attending: Cardiology | Admitting: Cardiology

## 2013-02-12 ENCOUNTER — Encounter (HOSPITAL_COMMUNITY)
Admission: RE | Disposition: A | Discharge status: Home / Self Care | Payer: Self-pay | Source: Ambulatory Visit | Attending: Cardiology

## 2013-02-12 DIAGNOSIS — J4489 Other specified chronic obstructive pulmonary disease: Secondary | ICD-10-CM | POA: Insufficient documentation

## 2013-02-12 DIAGNOSIS — R911 Solitary pulmonary nodule: Secondary | ICD-10-CM | POA: Insufficient documentation

## 2013-02-12 DIAGNOSIS — I4891 Unspecified atrial fibrillation: Secondary | ICD-10-CM | POA: Insufficient documentation

## 2013-02-12 DIAGNOSIS — Z87891 Personal history of nicotine dependence: Secondary | ICD-10-CM | POA: Insufficient documentation

## 2013-02-12 DIAGNOSIS — Z952 Presence of prosthetic heart valve: Secondary | ICD-10-CM

## 2013-02-12 DIAGNOSIS — I059 Rheumatic mitral valve disease, unspecified: Secondary | ICD-10-CM | POA: Insufficient documentation

## 2013-02-12 DIAGNOSIS — Z7901 Long term (current) use of anticoagulants: Secondary | ICD-10-CM | POA: Insufficient documentation

## 2013-02-12 DIAGNOSIS — D509 Iron deficiency anemia, unspecified: Secondary | ICD-10-CM | POA: Insufficient documentation

## 2013-02-12 DIAGNOSIS — Z79899 Other long term (current) drug therapy: Secondary | ICD-10-CM | POA: Insufficient documentation

## 2013-02-12 DIAGNOSIS — I509 Heart failure, unspecified: Secondary | ICD-10-CM | POA: Insufficient documentation

## 2013-02-12 DIAGNOSIS — K219 Gastro-esophageal reflux disease without esophagitis: Secondary | ICD-10-CM | POA: Insufficient documentation

## 2013-02-12 DIAGNOSIS — I5032 Chronic diastolic (congestive) heart failure: Secondary | ICD-10-CM | POA: Insufficient documentation

## 2013-02-12 DIAGNOSIS — J449 Chronic obstructive pulmonary disease, unspecified: Secondary | ICD-10-CM | POA: Insufficient documentation

## 2013-02-12 DIAGNOSIS — Z954 Presence of other heart-valve replacement: Secondary | ICD-10-CM | POA: Insufficient documentation

## 2013-02-12 DIAGNOSIS — Z902 Acquired absence of lung [part of]: Secondary | ICD-10-CM | POA: Insufficient documentation

## 2013-02-12 HISTORY — PX: TEE WITHOUT CARDIOVERSION: SHX5443

## 2013-02-12 SURGERY — ECHOCARDIOGRAM, TRANSESOPHAGEAL
Anesthesia: Moderate Sedation

## 2013-02-12 MED ORDER — MIDAZOLAM HCL 5 MG/ML IJ SOLN
INTRAMUSCULAR | Status: AC
  Filled 2013-02-12: qty 1

## 2013-02-12 MED ORDER — MIDAZOLAM HCL 5 MG/ML IJ SOLN
INTRAMUSCULAR | Status: AC
  Filled 2013-02-12: qty 2

## 2013-02-12 MED ORDER — MIDAZOLAM HCL 10 MG/2ML IJ SOLN
INTRAMUSCULAR | Status: DC | PRN
  Administered 2013-02-12: 1 mg via INTRAVENOUS
  Administered 2013-02-12 (×2): 2 mg via INTRAVENOUS

## 2013-02-12 MED ORDER — FENTANYL CITRATE 0.05 MG/ML IJ SOLN
INTRAMUSCULAR | Status: AC
  Filled 2013-02-12: qty 2

## 2013-02-12 MED ORDER — SODIUM CHLORIDE 0.9 % IV SOLN
INTRAVENOUS | Status: DC
  Administered 2013-02-12: 500 mL via INTRAVENOUS

## 2013-02-12 MED ORDER — LIDOCAINE VISCOUS 2 % MT SOLN
OROMUCOSAL | Status: AC
  Filled 2013-02-12: qty 15

## 2013-02-12 MED ORDER — FENTANYL CITRATE 0.05 MG/ML IJ SOLN
INTRAMUSCULAR | Status: DC | PRN
  Administered 2013-02-12: 25 ug via INTRAVENOUS
  Administered 2013-02-12: 50 ug via INTRAVENOUS

## 2013-02-12 MED ORDER — LIDOCAINE VISCOUS 2 % MT SOLN
OROMUCOSAL | Status: DC | PRN
  Administered 2013-02-12: 10 mL via OROMUCOSAL

## 2013-02-12 NOTE — Interval H&P Note (Signed)
History and Physical Interval Note:  02/12/2013 2:38 PM  Jeffery Copeland  has presented today for surgery, with the diagnosis of MITRAL REGURGITATION  The various methods of treatment have been discussed with the patient and family. After consideration of risks, benefits and other options for treatment, the patient has consented to  Procedure(s): TRANSESOPHAGEAL ECHOCARDIOGRAM (TEE) (N/A) as a surgical intervention .  The patient's history has been reviewed, patient examined, no change in status, stable for surgery.  I have reviewed the patient's chart and labs.  Questions were answered to the patient's satisfaction.     Dietrich Pates

## 2013-02-12 NOTE — Op Note (Signed)
Full report to follow 

## 2013-02-12 NOTE — Progress Notes (Signed)
Echocardiogram Echocardiogram Transesophageal has been performed.  Shawneequa Baldridge 02/12/2013, 3:25 PM

## 2013-02-12 NOTE — Telephone Encounter (Signed)
LMTCB regarding heart cath next week as discussed per Dr. Shirlee Latch. Mylo Red RN

## 2013-02-12 NOTE — Progress Notes (Signed)
TEE reviewed with Dr. Tenny Craw.  Patient has severe, symptomatic bioprosthetic mitral regurgitation.  He will need valve replacement.  I will arrange LHC/RHC for next week followed by CVTS evaluation.   Marca Ancona 02/12/2013 3:38 PM

## 2013-02-12 NOTE — Telephone Encounter (Signed)
I have left message for pt to call back about scheduled heart cath in [facility] on 7//24/14 with Dr. Shirlee Latch.  Checklist done, lab orders placed, & letter ready.  On Anne's desk. Mylo Red RN

## 2013-02-12 NOTE — H&P (View-Only) (Signed)
Patient ID: Jeffery Copeland, male   DOB: September 01, 1945, 67 y.o.   MRN: [id number] PCP: Dr. Renae Gloss  67 yo with history of endocarditis necessitating bioprosthetic mitral valve replacement in 1999 and chronic atrial fibrillation presents for cardiology followup.  He has been seen by Dr. Riley Kill in the past and is seen by me for the first time today.  I have reviewed all his old records.  For the last approximately 2 years, he has had significant exertional dyspnea.  He is short of breath after walking about 1/4 mile.  He used to be able to walk as much as he wanted without dyspnea.  He is short of breath with steps.  No orthopnea, PND, or chest pain.  Dr. Riley Kill noted a systolic murmur at a prior visit and ordered a TTE to look for MR.  I reviewed the echo today.  This did not show any definite MR but there was significant shadowing from the bioprosthetic valve.    ECG: Atrial fibrillation, rate 72  Labs (4/14): K 4.1, creatinine 1.3  PMH: 1. Fe deficiency anemia 2. 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  Echo (2/14) with EF 55-60%, bioprosthetic mitral valve with mean gradient 8 mmHg, no definite MR noted though there was significant shadowing, severe LAE.  3. COPD: Followed by Dr. Delford Field. 4. GERD 5. Lung nodules: LLL nodule removed in 2008 by left lower lobectomy.  RLL nodule, currently observing.  6. Atrial fibrillation: Chronic, on coumadin. 7. Chronic diastolic CHF  SH: Divorced, lives alone in [place].  Quit smoking in 2006.    FH: No premature CAD.  ROS: All systems reviewed and negative except as per HPI.   Current Outpatient Prescriptions  Medication Sig Dispense Refill  . Cholecalciferol (VITAMIN D3) 1000 UNITS CAPS Take 1 capsule by mouth daily.       Marland Kitchen ezetimibe (ZETIA) 10 MG tablet Take 1 tablet (10 mg total) by mouth daily.  30 tablet  6  . furosemide (LASIX) 20 MG tablet Take 1 tablet (20 mg total) by mouth daily.  30 tablet  6  .  HYDROcodone-acetaminophen (LORTAB) 10-500 MG per tablet Take 1 tablet by mouth every 6 (six) hours as needed. For pain.      . metoprolol tartrate (LOPRESSOR) 25 MG tablet Take 1 tablet (25 mg total) by mouth 2 (two) times daily.  60 tablet  3  . omeprazole (PRILOSEC) 20 MG capsule Take 20 mg by mouth daily.       . potassium chloride SA (K-DUR,KLOR-CON) 20 MEQ tablet Take 1 tablet (20 mEq total) by mouth daily.  30 tablet  3  . ramipril (ALTACE) 5 MG capsule Take 1 capsule (5 mg total) by mouth daily.  30 capsule  6  . tiotropium (SPIRIVA HANDIHALER) 18 MCG inhalation capsule PLACE 1 CAPSULE (18 MCG TOTAL) INTO INHALER AND INHALE DAILY.  30 capsule  11  . warfarin (COUMADIN) 5 MG tablet TAKE 1 TABLET BY MOUTH AS DIRECTED  45 tablet  3  . zolpidem (AMBIEN) 5 MG tablet Take 5 mg by mouth at bedtime as needed. For insomnia.       No current facility-administered medications for this visit.    BP 126/66  Pulse 72  Ht 5\' 9"  (1.753 m)  Wt 175 lb 2 oz (79.436 kg)  BMI 25.85 kg/m2 General: NAD Neck: No JVD, no thyromegaly or thyroid nodule.  Lungs: Clear to auscultation bilaterally with normal respiratory effort. CV: Nondisplaced PMI.  Heart regular S1/S2, +S4, 3/6 HSM at apex.  No peripheral edema.  No carotid bruit.  Normal pedal pulses.  Abdomen: Soft, nontender, no hepatosplenomegaly, no distention.  Skin: Intact without lesions or rashes.  Neurologic: Alert and oriented x 3.  Psych: Normal affect. Extremities: No clubbing or cyanosis.   Assessment/Plan:  1. Exertional dyspnea: This has been chronic for around 2 years.  It may be due to COPD.  However, I also hear a significant mitral area systolic murmur that I cannot explain based on the recent TTE.  He does not look volume overloaded on exam.  He has had no exertional chest pain.  As a first step, I think that we need to get a closer look at the bioprosthetic mitral valve.  There is considerable shadowing on the transthoracic echo and I  cannot fully rule out significant mitral regurgitation.  I will do a TEE to look for MR.   2. Bioprosthetic mitral valve: There did not appear to be significant bioprosthetic stenosis on most recent echo. As above, plan for TEE to assess for MR.  3. Atrial fibrillation: Chronic, good rate control on current dose of metoprolol.  Continue anticoagulation with coumadin given valvular atrial fibrillation.   Marca Ancona 01/28/2013 10:02 PM

## 2013-02-13 ENCOUNTER — Encounter: Payer: Self-pay | Admitting: *Deleted

## 2013-02-13 ENCOUNTER — Encounter (HOSPITAL_COMMUNITY): Payer: Self-pay | Admitting: Internal Medicine

## 2013-02-13 NOTE — Telephone Encounter (Signed)
Jeffery Morale, MD      Jeffery Copeland     MRN: [id number] DOB: [birth year]   Sent: Caleen Essex February 13, 2013 10:07 AM   Pt Home: [phone]     To: Jacqlyn Krauss, RN                          Message    No history of CVA, so would hold coumadin 3 full days prior to procedure and point of care INR the am of procedure.        02/13/13

## 2013-02-13 NOTE — Telephone Encounter (Signed)
LMTCB

## 2013-02-13 NOTE — Telephone Encounter (Signed)
I spoke with patient's sister, Wynona Canes about cath and instructions.

## 2013-02-16 ENCOUNTER — Other Ambulatory Visit: Payer: Medicare PPO

## 2013-02-17 ENCOUNTER — Other Ambulatory Visit (INDEPENDENT_AMBULATORY_CARE_PROVIDER_SITE_OTHER): Payer: Medicare PPO

## 2013-02-17 DIAGNOSIS — I4891 Unspecified atrial fibrillation: Secondary | ICD-10-CM

## 2013-02-17 LAB — CBC WITH DIFFERENTIAL/PLATELET
Basophils Absolute: 0 10*3/uL (ref 0.0–0.1)
Eosinophils Absolute: 0.3 10*3/uL (ref 0.0–0.7)
HCT: 40.3 % (ref 39.0–52.0)
Hemoglobin: 13.4 g/dL (ref 13.0–17.0)
Lymphs Abs: 1 10*3/uL (ref 0.7–4.0)
MCHC: 33.3 g/dL (ref 30.0–36.0)
MCV: 97.6 fl (ref 78.0–100.0)
Neutro Abs: 5.2 10*3/uL (ref 1.4–7.7)
RDW: 13.6 % (ref 11.5–14.6)

## 2013-02-17 LAB — PROTIME-INR
INR: 2.9 ratio — ABNORMAL HIGH (ref 0.8–1.0)
Prothrombin Time: 30.3 s — ABNORMAL HIGH (ref 10.2–12.4)

## 2013-02-17 LAB — BASIC METABOLIC PANEL
BUN: 8 mg/dL (ref 6–23)
Calcium: 9.2 mg/dL (ref 8.4–10.5)
Creatinine, Ser: 1.4 mg/dL (ref 0.4–1.5)

## 2013-02-19 ENCOUNTER — Ambulatory Visit (INDEPENDENT_AMBULATORY_CARE_PROVIDER_SITE_OTHER): Payer: Medicare PPO

## 2013-02-19 ENCOUNTER — Encounter (HOSPITAL_BASED_OUTPATIENT_CLINIC_OR_DEPARTMENT_OTHER)
Admission: RE | Disposition: A | Discharge status: Home / Self Care | Payer: Self-pay | Source: Ambulatory Visit | Attending: Cardiology

## 2013-02-19 ENCOUNTER — Encounter: Payer: Self-pay | Admitting: *Deleted

## 2013-02-19 ENCOUNTER — Inpatient Hospital Stay (HOSPITAL_BASED_OUTPATIENT_CLINIC_OR_DEPARTMENT_OTHER)
Admission: RE | Admit: 2013-02-19 | Discharge: 2013-02-19 | Disposition: A | Discharge status: Home / Self Care | Payer: Medicare PPO | Source: Ambulatory Visit | Attending: Cardiology | Admitting: Cardiology

## 2013-02-19 DIAGNOSIS — Z954 Presence of other heart-valve replacement: Secondary | ICD-10-CM

## 2013-02-19 DIAGNOSIS — Z7901 Long term (current) use of anticoagulants: Secondary | ICD-10-CM

## 2013-02-19 DIAGNOSIS — J449 Chronic obstructive pulmonary disease, unspecified: Secondary | ICD-10-CM | POA: Insufficient documentation

## 2013-02-19 DIAGNOSIS — R0989 Other specified symptoms and signs involving the circulatory and respiratory systems: Secondary | ICD-10-CM | POA: Insufficient documentation

## 2013-02-19 DIAGNOSIS — J4489 Other specified chronic obstructive pulmonary disease: Secondary | ICD-10-CM | POA: Insufficient documentation

## 2013-02-19 DIAGNOSIS — Z952 Presence of prosthetic heart valve: Secondary | ICD-10-CM | POA: Insufficient documentation

## 2013-02-19 DIAGNOSIS — I059 Rheumatic mitral valve disease, unspecified: Secondary | ICD-10-CM | POA: Insufficient documentation

## 2013-02-19 DIAGNOSIS — I4891 Unspecified atrial fibrillation: Secondary | ICD-10-CM | POA: Insufficient documentation

## 2013-02-19 DIAGNOSIS — I5032 Chronic diastolic (congestive) heart failure: Secondary | ICD-10-CM | POA: Insufficient documentation

## 2013-02-19 DIAGNOSIS — I2789 Other specified pulmonary heart diseases: Secondary | ICD-10-CM | POA: Insufficient documentation

## 2013-02-19 DIAGNOSIS — R0609 Other forms of dyspnea: Secondary | ICD-10-CM | POA: Insufficient documentation

## 2013-02-19 DIAGNOSIS — I509 Heart failure, unspecified: Secondary | ICD-10-CM | POA: Insufficient documentation

## 2013-02-19 LAB — POCT I-STAT 3, ART BLOOD GAS (G3+)
O2 Saturation: 96 %
TCO2: 25 mmol/L (ref 0–100)
pO2, Arterial: 79 mmHg — ABNORMAL LOW (ref 80.0–100.0)

## 2013-02-19 LAB — POCT I-STAT 3, VENOUS BLOOD GAS (G3P V)
Acid-Base Excess: 1 mmol/L (ref 0.0–2.0)
pCO2, Ven: 44.7 mmHg — ABNORMAL LOW (ref 45.0–50.0)
pO2, Ven: 37 mmHg (ref 30.0–45.0)

## 2013-02-19 SURGERY — JV LEFT AND RIGHT HEART CATHETERIZATION WITH CORONARY ANGIOGRAM
Anesthesia: Moderate Sedation

## 2013-02-19 MED ORDER — ONDANSETRON HCL 4 MG/2ML IJ SOLN: 4.0000 mg | Freq: Four times a day (QID) | INTRAMUSCULAR | Status: DC | PRN

## 2013-02-19 MED ORDER — SODIUM CHLORIDE 0.9 % IV SOLN: INTRAVENOUS | Status: AC

## 2013-02-19 MED ORDER — ACETAMINOPHEN 325 MG PO TABS: 650.0000 mg | ORAL_TABLET | ORAL | Status: DC | PRN

## 2013-02-19 NOTE — OR Nursing (Signed)
Tegaderm dressing applied, site level 0, bedrest began at 1345

## 2013-02-19 NOTE — OR Nursing (Signed)
Dr Shirlee Latch at bedside to discuss results and treatment paln with pt and family

## 2013-02-19 NOTE — H&P (View-Only) (Signed)
Patient ID: Jeffery Copeland, male   DOB: 07/16/1946, 67 y.o.   MRN: [id number] PCP: Dr. Shelton  67 yo with history of endocarditis necessitating bioprosthetic mitral valve replacement in 1999 and chronic atrial fibrillation presents for cardiology followup.  He has been seen by Dr. Stuckey in the past and is seen by me for the first time today.  I have reviewed all his old records.  For the last approximately 2 years, he has had significant exertional dyspnea.  He is short of breath after walking about 1/4 mile.  He used to be able to walk as much as he wanted without dyspnea.  He is short of breath with steps.  No orthopnea, PND, or chest pain.  Dr. Stuckey noted a systolic murmur at a prior visit and ordered a TTE to look for MR.  I reviewed the echo today.  This did not show any definite MR but there was significant shadowing from the bioprosthetic valve.    ECG: Atrial fibrillation, rate 72  Labs (4/14): K 4.1, creatinine 1.3  PMH: 1. Fe deficiency anemia 2. 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  Echo (2/14) with EF 55-60%, bioprosthetic mitral valve with mean gradient 8 mmHg, no definite MR noted though there was significant shadowing, severe LAE.  3. COPD: Followed by Dr. Wright. 4. GERD 5. Lung nodules: LLL nodule removed in 2008 by left lower lobectomy.  RLL nodule, currently observing.  6. Atrial fibrillation: Chronic, on coumadin. 7. Chronic diastolic CHF  SH: Divorced, lives alone in [place].  Quit smoking in 2006.    FH: No premature CAD.  ROS: All systems reviewed and negative except as per HPI.   Current Outpatient Prescriptions  Medication Sig Dispense Refill  . Cholecalciferol (VITAMIN D3) 1000 UNITS CAPS Take 1 capsule by mouth daily.       . ezetimibe (ZETIA) 10 MG tablet Take 1 tablet (10 mg total) by mouth daily.  30 tablet  6  . furosemide (LASIX) 20 MG tablet Take 1 tablet (20 mg total) by mouth daily.  30 tablet  6  .  HYDROcodone-acetaminophen (LORTAB) 10-500 MG per tablet Take 1 tablet by mouth every 6 (six) hours as needed. For pain.      . metoprolol tartrate (LOPRESSOR) 25 MG tablet Take 1 tablet (25 mg total) by mouth 2 (two) times daily.  60 tablet  3  . omeprazole (PRILOSEC) 20 MG capsule Take 20 mg by mouth daily.       . potassium chloride SA (K-DUR,KLOR-CON) 20 MEQ tablet Take 1 tablet (20 mEq total) by mouth daily.  30 tablet  3  . ramipril (ALTACE) 5 MG capsule Take 1 capsule (5 mg total) by mouth daily.  30 capsule  6  . tiotropium (SPIRIVA HANDIHALER) 18 MCG inhalation capsule PLACE 1 CAPSULE (18 MCG TOTAL) INTO INHALER AND INHALE DAILY.  30 capsule  11  . warfarin (COUMADIN) 5 MG tablet TAKE 1 TABLET BY MOUTH AS DIRECTED  45 tablet  3  . zolpidem (AMBIEN) 5 MG tablet Take 5 mg by mouth at bedtime as needed. For insomnia.       No current facility-administered medications for this visit.    BP 126/66  Pulse 72  Ht 5' 9" (1.753 m)  Wt 175 lb 2 oz (79.436 kg)  BMI 25.85 kg/m2 General: NAD Neck: No JVD, no thyromegaly or thyroid nodule.  Lungs: Clear to auscultation bilaterally with normal respiratory effort. CV: Nondisplaced PMI.    Heart regular S1/S2, +S4, 3/6 HSM at apex.  No peripheral edema.  No carotid bruit.  Normal pedal pulses.  Abdomen: Soft, nontender, no hepatosplenomegaly, no distention.  Skin: Intact without lesions or rashes.  Neurologic: Alert and oriented x 3.  Psych: Normal affect. Extremities: No clubbing or cyanosis.   Assessment/Plan:  1. Exertional dyspnea: This has been chronic for around 2 years.  It may be due to COPD.  However, I also hear a significant mitral area systolic murmur that I cannot explain based on the recent TTE.  He does not look volume overloaded on exam.  He has had no exertional chest pain.  As a first step, I think that we need to get a closer look at the bioprosthetic mitral valve.  There is considerable shadowing on the transthoracic echo and I  cannot fully rule out significant mitral regurgitation.  I will do a TEE to look for MR.   2. Bioprosthetic mitral valve: There did not appear to be significant bioprosthetic stenosis on most recent echo. As above, plan for TEE to assess for MR.  3. Atrial fibrillation: Chronic, good rate control on current dose of metoprolol.  Continue anticoagulation with coumadin given valvular atrial fibrillation.   Astra Gregg 01/28/2013 10:02 PM    

## 2013-02-19 NOTE — CV Procedure (Addendum)
   Cardiac Catheterization Procedure Note  Name: Jeffery Copeland MRN: [id number] DOB: 1945-10-27  Procedure: Right Heart Cath, Left Heart Cath, Selective Coronary Angiography, LV angiography  Indication: Mitral regurgitation, considering valve replacement.    Procedural Details: The right groin was prepped, draped, and anesthetized with 1% lidocaine. Using the modified Seldinger technique a 4 French sheath was placed in the right femoral artery and a 7 French sheath was placed in the right femoral vein. A Swan-Ganz catheter was used for the right heart catheterization. Standard protocol was followed for recording of right heart pressures and sampling of oxygen saturations. Fick cardiac output was calculated. Standard Judkins catheters were used for selective coronary angiography and left ventriculography. There were no immediate procedural complications. The patient was transferred to the post catheterization recovery area for further monitoring.  Procedural Findings: Hemodynamics (mmHg) RA mean 5  RV 42/6 PA 42/14, mean 27 PCWP mean 12 with prominent V waves LV 153/20 AO 149/70  Oxygen saturations: PA 68% AO 96%  Cardiac Output (Fick) 4.8  Cardiac Index (Fick) 2.5   PVR 3.13 WU  Coronary angiography: Coronary dominance: right  Left mainstem: No significant disease.   Left anterior descending (LAD): The distal LAD tapers to small size but no significant obstructive disease.  Left circumflex (LCx): No significant disease.   Right coronary artery (RCA): No significant disease.  Left ventriculography: Left ventricular systolic function is normal, LVEF is estimated at 60-65% with no regional wall motion abnormalities in the RAO projection, there is 3+ mitral regurgitation noted.   Final Conclusions:  Mild pulmonary hypertension with prominent V-waves in the PCWP pressure tracing.  3+ MR by LV-gram (very eccentric MR in multiple jets on TEE), no obstructive coronary disease.    Recommendations: Given significant dyspnea and severe MR by echo, I will refer patient to CVTS for evaluation for redo MVR.  Resume coumadin tonight.   Marca Ancona 02/19/2013, 1:31 PM

## 2013-02-19 NOTE — Interval H&P Note (Signed)
History and Physical Interval Note:  02/19/2013 12:56 PM  Jeffery Copeland  has presented today for surgery, with the diagnosis of R/O MR  The various methods of treatment have been discussed with the patient and family. After consideration of risks, benefits and other options for treatment, the patient has consented to  Procedure(s): JV LEFT AND RIGHT HEART CATHETERIZATION WITH CORONARY ANGIOGRAM (N/A) as a surgical intervention .  The patient's history has been reviewed, patient examined, no change in status, stable for surgery.  I have reviewed the patient's chart and labs.  Questions were answered to the patient's satisfaction.     Hasna Stefanik [employer]

## 2013-02-20 ENCOUNTER — Telehealth: Payer: Self-pay | Admitting: *Deleted

## 2013-02-20 ENCOUNTER — Other Ambulatory Visit: Payer: Self-pay | Admitting: *Deleted

## 2013-02-20 ENCOUNTER — Encounter: Payer: Self-pay | Admitting: Thoracic Surgery (Cardiothoracic Vascular Surgery)

## 2013-02-20 ENCOUNTER — Institutional Professional Consult (permissible substitution) (INDEPENDENT_AMBULATORY_CARE_PROVIDER_SITE_OTHER): Payer: Medicare PPO | Admitting: Thoracic Surgery (Cardiothoracic Vascular Surgery)

## 2013-02-20 ENCOUNTER — Other Ambulatory Visit: Payer: Self-pay | Admitting: Cardiology

## 2013-02-20 VITALS — BP 130/81 | HR 64 | Resp 20 | Ht 69.0 in | Wt 175.0 lb

## 2013-02-20 DIAGNOSIS — I340 Nonrheumatic mitral (valve) insufficiency: Secondary | ICD-10-CM

## 2013-02-20 DIAGNOSIS — I059 Rheumatic mitral valve disease, unspecified: Secondary | ICD-10-CM

## 2013-02-20 DIAGNOSIS — R0609 Other forms of dyspnea: Secondary | ICD-10-CM

## 2013-02-20 DIAGNOSIS — R0600 Dyspnea, unspecified: Secondary | ICD-10-CM

## 2013-02-20 DIAGNOSIS — Z952 Presence of prosthetic heart valve: Secondary | ICD-10-CM

## 2013-02-20 DIAGNOSIS — R0989 Other specified symptoms and signs involving the circulatory and respiratory systems: Secondary | ICD-10-CM

## 2013-02-20 DIAGNOSIS — Z954 Presence of other heart-valve replacement: Secondary | ICD-10-CM

## 2013-02-20 HISTORY — DX: Nonrheumatic mitral (valve) insufficiency: I34.0

## 2013-02-20 NOTE — Progress Notes (Signed)
[address]       [place] [address]             [phone]     CARDIOTHORACIC SURGERY CONSULTATION REPORT  Referring Provider is Laurey Morale, MD PCP is Alva Garnet., MD  Chief Complaint  Patient presents with  . Mitral Regurgitation    Surgical eval for re-do of MVR, cardiac cath 02/19/13, ECHO 02/12/13    HPI:  Patient is a 67 year old divorced white male from [place] who underwent mitral valve replacement using a porcine bioprosthetic tissue valve in 1999 for complicated bacterial endocarditis.  Findings at the time of surgery were consistent with likely chronic mitral valve prolapse with mitral regurgitation that subsequently became complicated by acute and subacute bacterial endocarditis with group G. Streptococcus. A bioprosthetic tissue valve was chosen at the time because of the patient's ongoing troubles with alcohol abuse and history of seizure disorder.  The patient recovered uneventfully.  The patient has been followed by Dr. Riley Kill for several years with history of persistent atrial fibrillation on chronic Coumadin therapy.  He has done reasonably well with this, although apparently he did suffer an epidural hematoma in 2012 at which time he was notably supratherapeutic on Coumadin.  Over the past 2 years the patient has developed progressive symptoms of exertional shortness of breath.  He was noted to have a prominent systolic murmur on exam by both Dr. Riley Kill and Dr. Delford Field, but transthoracic echocardiogram performed in February of this year did not demonstrate much of any mitral regurgitation.  The patient was subsequently seen in followup earlier this month by Dr. Shirlee Latch who reviewed the patient's most recent transthoracic echocardiogram and noted that the presence of significant acoustic shadowing might be missing the presence of significant mitral regurgitation. Subsequently a transesophageal echocardiogram was performed 02/12/2013  by Dr. Tenny Craw. 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 Left and right heart catheterization was performed by Dr. Shirlee Latch yesterday. This confirmed the absence of significant coronary artery disease and was notable for the presence of mild pulmonary hypertension with prominent V waves on pulmonary capillary wedge tracing. The patient has been referred for surgical consultation.  The patient lives alone and remained functionally independent. He states that he gave up drinking 2 years ago, and he quit smoking approximately 8 years ago. He describes a 2 year history of progressive exertional shortness of breath. He denies resting shortness of breath, PND, orthopnea, or lower extremity edema. He has occasional episodes of atypical chest pain that seemed to come and go without particular relation to activity or stress. Patient has occasional palpitations. He denies any history of dizzy spells or syncope. He reports that he is a bit unsteady on his feet at times and he has had multiple falls in the past.  Past Medical History  Diagnosis Date  . Iron deficiency anemia, unspecified   . 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 Cerebrovascular disease, unspecified   . Esophageal reflux   . Generalized osteoarthrosis, unspecified site   . Hypertension   . COPD (chronic obstructive pulmonary disease) 1999  . Lung nodules 2008, 2014    LLL nodule removed 2008 with LLL lobectomy,  RLL nodule 3mm observation  . Atrial fibrillation 11/11/2008    Qualifier: Diagnosis of  By: Riley Kill, MD, Johny Sax   . CARDIOMYOPATHY 10/20/2008    Qualifier: Diagnosis of  By: Manson Passey, RN,  BSN, Lauren    . ISCHEMIC COLITIS, HX OF 10/20/2008    Qualifier: Diagnosis of  By: Manson Passey, RN, BSN, Lauren    . 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 HYPERTENSION, BENIGN 10/20/2008    Qualifier: Diagnosis of  By: Manson Passey, RN, BSN, Lauren    . Epidural hematoma 03/01/2011    Recent fall 2012   . Severe mitral regurgitation 02/20/2013    Past Surgical History  Procedure Laterality Date  . Mitral valve replacement  02/01/1998    Carpentier-Edwards porcine bioprosthetic tissue valve, size 31mm, placed for complicated bacterial endocarditis  . Video assisted thoracoscopy  04/29/2007    Left VATS w/ mini thoracotomy for Left Lower Lobectomy for benign lung nodules  . Tee without cardioversion N/A 02/12/2013    Procedure: TRANSESOPHAGEAL ECHOCARDIOGRAM (TEE);  Surgeon: Pricilla Riffle, MD;  Location: [facility] ENDOSCOPY;  Service: Cardiovascular;  Laterality: N/A;    History reviewed. No pertinent family history.  History   Social History  . Marital Status: Divorced    Spouse Name: N/A    Number of Children: N/A  . Years of Education: N/A   Occupational History  . Not on file.   Social History Main Topics  . Smoking status: Former Smoker -- 2.00 packs/day for 40 years    Types: Cigarettes    Quit date: 07/30/2004  . Smokeless tobacco: Never Used  . Alcohol Use: Yes  . Drug Use: No  . Sexually Active: Not on file   Other Topics Concern  . Not on file   Social History Narrative  . No narrative on file    Current Outpatient Prescriptions  Medication Sig Dispense Refill  . Cholecalciferol (VITAMIN D3) 1000 UNITS CAPS Take 1 capsule by mouth daily.       Marland Kitchen ezetimibe (ZETIA) 10 MG tablet Take 1 tablet (10 mg total) by mouth daily.  30 tablet  6  . furosemide (LASIX) 20 MG tablet Take 1 tablet (20 mg total) by mouth daily.  30 tablet  6  . HYDROcodone-acetaminophen (LORTAB) 10-500 MG per tablet Take 1 tablet by mouth every 6 (six) hours as needed. For pain.      . metoprolol tartrate (LOPRESSOR) 25 MG tablet Take 1 tablet (25 mg total) by mouth 2  (two) times daily.  60 tablet  3  . omeprazole (PRILOSEC) 20 MG capsule Take 20 mg by mouth daily.       . potassium chloride SA (K-DUR,KLOR-CON) 20 MEQ tablet Take 1 tablet (20 mEq total) by mouth daily.  30 tablet  3  . ramipril (ALTACE) 5 MG capsule Take 1 capsule (5 mg total) by mouth daily.  30 capsule  6  . tiotropium (SPIRIVA HANDIHALER) 18 MCG inhalation capsule PLACE 1 CAPSULE (18 MCG TOTAL) INTO INHALER AND INHALE DAILY.  30 capsule  11  . warfarin (COUMADIN) 5 MG tablet TAKE 1 TABLET BY MOUTH AS DIRECTED  45 tablet  3  . zolpidem (AMBIEN) 5 MG tablet Take 5 mg by mouth at bedtime as needed. For insomnia.       No current facility-administered medications for this visit.    Allergies  Allergen Reactions  . Rosuvastatin Other (See Comments)    Muscle aches      Review of Systems:   General:  normal appetite, decreased energy, no weight gain, no  weight loss, no fever  Cardiac:  no chest pain with exertion, occasional brief chest pain at rest, +SOB with exertion, no resting SOB, no PND, no orthopnea, + palpitations, + arrhythmia, + atrial fibrillation, no LE edema, no dizzy spells, no syncope  Respiratory:  + exertional shortness of breath, no home oxygen, occasional productive cough, frequent dry cough, no bronchitis, + wheezing, no hemoptysis, no asthma, no pain with inspiration or cough, no sleep apnea, no CPAP at night  GI:   no difficulty swallowing, no reflux, no frequent heartburn, no hiatal hernia, no abdominal pain, no constipation, no diarrhea, no hematochezia, no hematemesis, no melena  GU:   no dysuria,  no frequency, no urinary tract infection, no hematuria, + enlarged prostate, no kidney stones, no kidney disease  Vascular:  no pain suggestive of claudication, + pain in feet, occasional leg cramps, no varicose veins, no DVT, no non-healing foot ulcer  Neuro:   no stroke, no TIA's, remote seizures, no headaches, no temporary blindness one eye,  no slurred speech, no  peripheral neuropathy, + chronic pain, + mild instability of gait, no memory/cognitive dysfunction  Musculoskeletal: + arthritis, no joint swelling, no myalgias, mild difficulty walking, mildly limited mobility   Skin:   no rash, no itching, no skin infections, no pressure sores or ulcerations  Psych:   no anxiety, no depression, no nervousness, no unusual recent stress  Eyes:   no blurry vision, no floaters, no recent vision changes, + wears glasses or contacts  ENT:   no hearing loss, no loose or painful teeth, edentulous w/ full set dentures  Hematologic:  no easy bruising, no abnormal bleeding, no clotting disorder, no frequent epistaxis  Endocrine:  no diabetes, does not check CBG's at home     Physical Exam:   BP 130/81  Pulse 64  Resp 20  Ht 5\' 9"  (1.753 m)  Wt 175 lb (79.379 kg)  BMI 25.83 kg/m2  SpO2 97%  General:  Mildly frail-appearing  HEENT:  Unremarkable   Neck:   no JVD, no bruits, no adenopathy   Chest:   clear to auscultation, symmetrical breath sounds, no wheezes, no rhonchi   CV:   RRR, grade IV/VI holosystolic murmur   Abdomen:  soft, non-tender, no masses   Extremities:  warm, well-perfused, pulses mildly diminished, no LE edema  Rectal/GU  Deferred  Neuro:   Grossly non-focal and symmetrical throughout  Skin:   Clean and dry, no rashes, no breakdown   Diagnostic Tests:   Transthoracic Echocardiography  Patient: Doyne, Micke MR #: [id number] Study Date: 09/02/2012 Gender: M Age: 47 Height: 175.3cm Weight: 79.4kg BSA: 1.45m^2 Pt. Status: Room:  [facility] REFERRING Shawnie Pons ATTENDING Olga Millers SONOGRAPHER Aida Raider, RDCS PERFORMING [facility], [facility] cc:  ------------------------------------------------------------ LV EF: 55% - 60%  ------------------------------------------------------------ Indications: 424.0 Mitral valve disease.  ------------------------------------------------------------ History:  PMH: Acquired from the patient and from the patient's chart. PMH: Carotid Artery Stenosis. History of Rheumatic Mitral Stenosis. Atrial Fibrillation/Atrial Flutter. History of Bacterial Endocarditis. Cardiomyopathy. Cerebrovascular Disease. COPD. Degenerative joint Disease. Risk factors: Former tobacco use. Hypertension.  ------------------------------------------------------------ Study Conclusions  - Left ventricle: The cavity size was normal. Wall thickness was normal. Systolic function was normal. The estimated ejection fraction was in the range of 55% to 60%. Wall motion was normal; there were no regional wall motion abnormalities. - Mitral valve: A bioprosthesis was present. - Left atrium: The atrium was severely dilated. - Right ventricle: The cavity size was mildly  dilated. Impressions:  - S/P MVR; mean gradient of 8 mmHg and MVA by pressure half time of 1.4 cm2.  ------------------------------------------------------------ Labs, prior tests, procedures, and surgery: Mitral Valve Replacement. Transthoracic echocardiography. M-mode, complete 2D, spectral Doppler, and color Doppler. Height: Height: 175.3cm. Height: 69in. Weight: Weight: 79.4kg. Weight: 174.6lb. Body mass index: BMI: 25.8kg/m^2. Body surface area: BSA: 1.47m^2. Blood pressure: 115/74. Patient status: Outpatient. Location: [facility]  ------------------------------------------------------------  ------------------------------------------------------------ Left ventricle: The cavity size was normal. Wall thickness was normal. Systolic function was normal. The estimated ejection fraction was in the range of 55% to 60%. Wall motion was normal; there were no regional wall motion abnormalities.  ------------------------------------------------------------ Aortic valve: Trileaflet; mildly thickened leaflets. Mobility was not restricted. Doppler: Transvalvular velocity was within the normal range.  There was no stenosis. No regurgitation.  ------------------------------------------------------------ Aorta: Aortic root: The aortic root was normal in size.  ------------------------------------------------------------ Mitral valve: A bioprosthesis was present. Doppler: No regurgitation. Valve area by pressure half-time: 1.38cm^2. Indexed valve area by pressure half-time: 0.71cm^2/m^2. Valve area by continuity equation (using LVOT flow): 1.04cm^2. Indexed valve area by continuity equation (using LVOT flow): 0.53cm^2/m^2. Mean gradient: 9mm Hg (D). Peak gradient: 19mm Hg (D).  ------------------------------------------------------------ Left atrium: The atrium was severely dilated.  ------------------------------------------------------------ Right ventricle: The cavity size was mildly dilated. Systolic function was normal.  ------------------------------------------------------------ Pulmonic valve: Doppler: Transvalvular velocity was within the normal range. There was no evidence for stenosis.  ------------------------------------------------------------ Tricuspid valve: Structurally normal valve. Doppler: Transvalvular velocity was within the normal range. Trivial regurgitation.  ------------------------------------------------------------ Right atrium: The atrium was at the upper limits of normal in size.  ------------------------------------------------------------ Pericardium: There was no pericardial effusion.  ------------------------------------------------------------ Systemic veins: Inferior vena cava: The vessel was normal in size.  ------------------------------------------------------------  2D measurements Normal Doppler measurements Normal Left ventricle Left ventricle LVID ED, 47.1 mm 43-52 Ea, lat 6.69 cm/s ------ chord, ann, tiss PLAX DP LVID ES, 32.7 mm 23-38 Ea, med 7.35 cm/s ------ chord, ann, tiss PLAX DP FS, chord, 31 % >29 LVOT PLAX Peak  vel, 106 cm/s ------ LVPW, ED 10.6 mm ------ S IVS/LVPW 1 <1.3 VTI, S 19.1 cm ------ ratio, ED Stroke 72.6 ml ------ Ventricular septum vol IVS, ED 10.6 mm ------ Stroke 37.2 ml/m^2 ------ LVOT index Diam, S 22 mm ------ Mitral valve Area 3.8 cm^2 ------ Mean vel, 138 cm/s ------ Diam 22 mm ------ D Aorta Pressure 160 ms ------ Root diam, 37 mm ------ half-time ED Mean 9 mm Hg ------ Left atrium gradient, AP dim 71 mm ------ D AP dim 3.64 cm/m^2 <2.2 Peak 19 mm Hg ------ index gradient, D Area 1.38 cm^2 ------ (PHT) Area 0.71 cm^2/m^ ------ index 2 (PHT) Area 1.04 cm^2 ------ (LVOT) continuit y Area 0.53 cm^2/m^ ------ index 2 (LVOT cont) Annulus 69.6 cm ------ VTI Right ventricle Sa vel, 10.4 cm/s ------ lat ann, tiss DP  ------------------------------------------------------------ Prepared and Electronically Authenticated by  Olga Millers 2014-02-04T15:07:[id number]    Transesophageal Echocardiography  Patient: Julius, Matus MR #: [id number] Study Date: 02/12/2013 Gender: M Age: 17 Height: 175.3cm Weight: 79.5kg BSA: 1.55m^2 Pt. Status: Room: [facility]  PERFORMING Dietrich Pates ORDERING Barrett, Rhonda REFERRING Barrett, Anthonette Legato, [place] ATTENDING Marca Ancona SONOGRAPHER Nolon Rod, RDCS cc:  ------------------------------------------------------------  ------------------------------------------------------------ Indications: Mitral regurgitation 424.0.  ------------------------------------------------------------ Study Conclusions  - Mitral valve: MV prosthesis is mildly thickened. Peak and mean gradients through the valve are 22 and 5 mm Hg respectively MVA by P T1/2 is 1.4 cm2. There are 3  prominent jets of MR One is central, one more anterior. One is eccentric and courses towards back of LA Overall MR is 3+ -4/4 with some late systolic backflow into the pulmonary veins. - Left atrium: No evidence of thrombus in the  atrial cavity or appendage. Transesophageal echocardiography. 2D and color Doppler. Height: Height: 175.3cm. Height: 69in. Weight: Weight: 79.5kg. Weight: 175lb. Body mass index: BMI: 25.9kg/m^2. Body surface area: BSA: 1.60m^2. Blood pressure: 141/23. Patient status: Outpatient. Location: Endoscopy.  ------------------------------------------------------------  ------------------------------------------------------------ Left ventricle: LVEF is normal.  ------------------------------------------------------------ Aortic valve: AV is normal. No AI.  ------------------------------------------------------------ Mitral valve: MV prosthesis is mildly thickened. Peak and mean gradients through the valve are 22 and 5 mm Hg respectively MVA by P T1/2 is 1.4 cm2. There are 3 prominent jets of MR One is central, one more anterior. One is eccentric and courses towards back of LA Overall MR is 3+ -4/4 with some late systolic backflow into the pulmonary veins. Doppler: Valve area by pressure half-time: 1.43cm^2. Indexed valve area by pressure half-time: 0.72cm^2/m^2.  ------------------------------------------------------------ Left atrium: LA and LA appendage are very large. No evidence of thrombus in the atrial cavity or appendage.  ------------------------------------------------------------ Tricuspid valve: TV is normal.  ------------------------------------------------------------  Doppler measurements Norma l Mitral valve Pressure 154 ms ----- half-time Area 1.4 cm^2 ----- (PHT) 3 Area 0.7 cm^2/m^2 ----- index 2 (PHT)  ------------------------------------------------------------ Prepared and Electronically Authenticated by  Dietrich Pates 2014-07-17T17:20:[id number]    Cardiac Catheterization Procedure Note  Name: GILLERMO POCH  MRN: [id number]  DOB: 05/15/1946  Procedure: Right Heart Cath, Left Heart Cath, Selective Coronary Angiography, LV angiography  Indication: Mitral  regurgitation, considering valve replacement.  Procedural Details: The right groin was prepped, draped, and anesthetized with 1% lidocaine. Using the modified Seldinger technique a 4 French sheath was placed in the right femoral artery and a 7 French sheath was placed in the right femoral vein. A Swan-Ganz catheter was used for the right heart catheterization. Standard protocol was followed for recording of right heart pressures and sampling of oxygen saturations. Fick cardiac output was calculated. Standard Judkins catheters were used for selective coronary angiography and left ventriculography. There were no immediate procedural complications. The patient was transferred to the post catheterization recovery area for further monitoring.  Procedural Findings:  Hemodynamics (mmHg)  RA mean 5  RV 42/6  PA 42/14, mean 27  PCWP mean 12 with prominent V waves  LV 153/20  AO 149/70  Oxygen saturations:  PA 68%  AO 96%  Cardiac Output (Fick) 4.8  Cardiac Index (Fick) 2.5  PVR 3.13 WU  Coronary angiography:  Coronary dominance: right  Left mainstem: No significant disease.  Left anterior descending (LAD): The distal LAD tapers to small size but no significant obstructive disease.  Left circumflex (LCx): No significant disease.  Right coronary artery (RCA): No significant disease.  Left ventriculography: Left ventricular systolic function is normal, LVEF is estimated at 60-65% with no regional wall motion abnormalities in the RAO projection, there is 3+ mitral regurgitation noted.  Final Conclusions: Mild pulmonary hypertension with prominent V-waves in the PCWP pressure tracing. 3+ MR by LV-gram (very eccentric MR in multiple jets on TEE), no obstructive coronary disease.  Recommendations: Given significant dyspnea and severe MR by echo, I will refer patient to CVTS for evaluation for redo MVR. Resume coumadin tonight.  Marca Ancona  02/19/2013, 1:31 PM   Impression:  The patient has  prosthetic valve dysfunction with severe mitral regurgitation having previously undergone mitral valve replacement using  a porcine bioprosthesis in 1999. He presents with progressive symptoms of exertional shortness of breath. Left ventricular systolic function is mild to moderately reduced but pulmonary artery pressures are only mild to moderately elevated.  The patient does not have significant coronary artery disease, risks associated with surgery will be somewhat elevated due to the patient's other comorbid medical conditions including chronic obstructive pulmonary disease, atrial fibrillation and mildly limited physical mobility.  The patient might benefit from concomitant Maze procedure if technically feasible at the time of surgery, although he appears to have a fairly long duration of chronic persistent atrial fibrillation dating back several years at least.  We will need to make a decision whether or not to replace his failing valve using another bioprosthetic tissue valve or alternatively a mechanical prosthesis.  The patient has done reasonably well on Coumadin therapy, although he did develop an epidural hematoma 2 years ago in the setting of supratherapeutic INR. Furthermore, the patient has history of multiple falls was somewhat unsteady gait.  Finally, the patient may be a reasonably good candidate for minimally invasive approach for surgery.   Plan:  The patient and his sister were counseled at length regarding the indications, risks, and potential benefits of redo mitral valve replacement in the office today.  Alternative surgical approaches have been discussed, including a comparison between conventional sternotomy and minimally-invasive techniques.  The relative risks and benefits of each have been reviewed as they pertain to the patient's specific circumstances, and all of their questions have been addressed.  The rationale for elective surgery has been explained, including a comparison  between surgery and continued medical therapy with close follow-up.  We discussed the possibility of replacing the mitral valve using a mechanical prosthesis with the attendant need for long-term anticoagulation versus the alternative of replacing it using a bioprosthetic tissue valve with its potential for late structural valve deterioration and failure, depending upon the patient's longevity.  The patient specifically requests that if the mitral valve must be replaced that it be done using a bioprosthetic tissue valve.  All questions have been addressed.  We tentatively plan to proceed with surgery on Tuesday, 03/24/2013. We will obtain formal pulmonary function tests as well as CT angiogram of the chest abdomen and pelvis to investigate possible sites of arterial access for possible minimally invasive approach for surgery.  The patient will return for followup on 03/16/2013 to review the results of these tests and make final plans for surgery. We will discuss stopping Coumadin at that time.     Salvatore Decent. Cornelius Moras, MD 02/20/2013 10:26 AM

## 2013-02-20 NOTE — Telephone Encounter (Signed)
Laurey Morale, MD ','<<< Less Detail       Laurey Morale, MD  Jeffery Copeland     MRN: [id number] DOB: 1945-09-21     Pt Home: [phone]     Sent: Morey Hummingbird February 19, 2013 1:54 PM     To: Jacqlyn Krauss, RN                          Message    Please make sure Mr Maue got CVTS appt.    Laurey Morale, MD ','<<< Less Detail       Laurey Morale, MD  Jeffery Copeland     MRN: [id number] DOB: Jun 22, 1946     Pt Home: [phone]     Sent: Morey Hummingbird February 19, 2013 1:54 PM     To: Jacqlyn Krauss, RN                          Message    Please make sure Mr Silas got CVTS appt.

## 2013-02-20 NOTE — Patient Instructions (Signed)
Schedule pulmonary function tests and CT scan as soon as practical  Continue all current medications without any changes for now  We will discuss stopping coumadin (blood thinner) at your next office visit

## 2013-02-25 ENCOUNTER — Ambulatory Visit (INDEPENDENT_AMBULATORY_CARE_PROVIDER_SITE_OTHER): Payer: Commercial Managed Care - HMO | Admitting: *Deleted

## 2013-02-25 DIAGNOSIS — I4891 Unspecified atrial fibrillation: Secondary | ICD-10-CM

## 2013-02-25 DIAGNOSIS — I059 Rheumatic mitral valve disease, unspecified: Secondary | ICD-10-CM

## 2013-02-25 DIAGNOSIS — Z7901 Long term (current) use of anticoagulants: Secondary | ICD-10-CM

## 2013-02-25 DIAGNOSIS — Z954 Presence of other heart-valve replacement: Secondary | ICD-10-CM

## 2013-02-25 DIAGNOSIS — Z952 Presence of prosthetic heart valve: Secondary | ICD-10-CM

## 2013-02-25 LAB — POCT INR: INR: 2.1

## 2013-02-25 NOTE — Progress Notes (Signed)
Patient ID: Jeffery Copeland, male   DOB: October 20, 1945, 67 y.o.   MRN: [id number] Full TEE report in CV studies.

## 2013-03-02 ENCOUNTER — Other Ambulatory Visit: Payer: Self-pay | Admitting: *Deleted

## 2013-03-02 DIAGNOSIS — I059 Rheumatic mitral valve disease, unspecified: Secondary | ICD-10-CM

## 2013-03-02 DIAGNOSIS — I4891 Unspecified atrial fibrillation: Secondary | ICD-10-CM

## 2013-03-06 ENCOUNTER — Ambulatory Visit (INDEPENDENT_AMBULATORY_CARE_PROVIDER_SITE_OTHER): Payer: Commercial Managed Care - HMO | Admitting: Cardiology

## 2013-03-06 ENCOUNTER — Encounter: Payer: Self-pay | Admitting: Cardiology

## 2013-03-06 VITALS — BP 137/84 | HR 63 | Ht 69.0 in | Wt 169.0 lb

## 2013-03-06 DIAGNOSIS — Z953 Presence of xenogenic heart valve: Secondary | ICD-10-CM

## 2013-03-06 DIAGNOSIS — I340 Nonrheumatic mitral (valve) insufficiency: Secondary | ICD-10-CM

## 2013-03-06 DIAGNOSIS — Z952 Presence of prosthetic heart valve: Secondary | ICD-10-CM

## 2013-03-06 DIAGNOSIS — I059 Rheumatic mitral valve disease, unspecified: Secondary | ICD-10-CM

## 2013-03-06 NOTE — Progress Notes (Signed)
CARDIOLOGY OFFICE NOTE  Patient ID: Jeffery Copeland MRN: [id number], DOB/AGE: 02-13-1946   Date of Visit: 03/06/2013  Primary Physician: Alva Garnet., MD Primary Cardiologist: Marca Ancona, MD Reason for Visit: Follow-up post cath  History of Present Illness  Jeffery Copeland is a 67 y.o. male with history of endocarditis necessitating bioprosthetic mitral valve replacement in 1999 and chronic atrial fibrillation presented to Dr. Shirlee Latch recently for cardiology followup. He was previously followed by Dr. Riley Kill. At that time, Jeffery Copeland reported increasing DOE. Dr. Riley Kill noted a systolic murmur at a prior visit and ordered a TTE to look for MR. Dr. Shirlee Latch reviewed the echo which did not show any definite MR but there was significant shadowing from the bioprosthetic valve. He was then scheduled for further evaluation with TEE and right and left cardiac cath. Ultimately he was found to have severe MR and was referred to [facility]TS for surgical consultation. He is scheduled for redo MVR later this month.  Since last being seen in our clinic, he reports he continues to have DOE but it is not worse than his baseline. He has no other complaints. He denies chest pain. He denies palpitations, dizziness, near syncope or syncope. He denies LE swelling, orthopnea, PND or recent weight gain. He is compliant and tolerating medications without difficulty.  Past Medical History Past Medical History  Diagnosis Date  . Iron deficiency anemia, unspecified   . 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 Cerebrovascular disease, unspecified   . Esophageal reflux   . Generalized osteoarthrosis, unspecified site   . Hypertension   . COPD (chronic obstructive pulmonary disease) 1999  . Lung nodules 2008, 2014    LLL nodule removed 2008 with LLL lobectomy,  RLL nodule 3mm observation  . Atrial fibrillation 11/11/2008    Qualifier: Diagnosis of  By: Riley Kill, MD, Johny Sax   .  CARDIOMYOPATHY 10/20/2008    Qualifier: Diagnosis of  By: Manson Passey, RN, BSN, Lauren    . ISCHEMIC COLITIS, HX OF 10/20/2008    Qualifier: Diagnosis of  By: Manson Passey, RN, BSN, Lauren    . 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 HYPERTENSION, BENIGN 10/20/2008    Qualifier: Diagnosis of  By: Manson Passey, RN, BSN, Lauren    . Epidural hematoma 03/01/2011    Recent fall 2012   . Severe mitral regurgitation 02/20/2013    Past Surgical History Past Surgical History  Procedure Laterality Date  . Mitral valve replacement  02/01/1998    Carpentier-Edwards porcine bioprosthetic tissue valve, size 31mm, placed for complicated bacterial endocarditis  . Video assisted thoracoscopy  04/29/2007    Left VATS w/ mini thoracotomy for Left Lower Lobectomy for benign lung nodules  . Tee without cardioversion N/A 02/12/2013    Procedure: TRANSESOPHAGEAL ECHOCARDIOGRAM (TEE);  Surgeon: Pricilla Riffle, MD;  Location: [facility] ENDOSCOPY;  Service: Cardiovascular;  Laterality: N/A;    Allergies/Intolerances Allergies  Allergen Reactions  . Rosuvastatin Other (See Comments)    Muscle aches   Current Home Medications Current Outpatient Prescriptions  Medication Sig Dispense Refill  . Cholecalciferol (VITAMIN D3) 1000 UNITS CAPS Take 1 capsule by mouth daily.       Marland Kitchen ezetimibe (ZETIA) 10 MG tablet Take 1 tablet (10 mg total) by mouth daily.  30 tablet  6  . furosemide (  LASIX) 20 MG tablet Take 1 tablet (20 mg total) by mouth daily.  30 tablet  6  . HYDROcodone-acetaminophen (LORTAB) 10-500 MG per tablet Take 1 tablet by mouth every 6 (six) hours as needed. For pain.      . metoprolol tartrate (LOPRESSOR) 25 MG tablet Take 1 tablet (25 mg total) by mouth 2 (two) times daily.  60 tablet  3  . omeprazole (PRILOSEC) 20 MG capsule Take 20 mg by mouth  daily.       . potassium chloride SA (K-DUR,KLOR-CON) 20 MEQ tablet TAKE 1 TABLET (20 MEQ TOTAL) BY MOUTH DAILY.  30 tablet  3  . ramipril (ALTACE) 5 MG capsule Take 1 capsule (5 mg total) by mouth daily.  30 capsule  6  . tiotropium (SPIRIVA HANDIHALER) 18 MCG inhalation capsule PLACE 1 CAPSULE (18 MCG TOTAL) INTO INHALER AND INHALE DAILY.  30 capsule  11  . warfarin (COUMADIN) 5 MG tablet TAKE 1 TABLET BY MOUTH AS DIRECTED  45 tablet  3  . zolpidem (AMBIEN) 5 MG tablet Take 5 mg by mouth at bedtime as needed. For insomnia.       No current facility-administered medications for this visit.   Social History Social History  . Marital Status: Divorced    Spouse Name: N/A    Number of Children: N/A  . Years of Education: N/A   Occupational History  . Not on file.   Social History Main Topics  . Smoking status: Former Smoker -- 2.00 packs/day for 40 years    Types: Cigarettes    Quit date: 07/30/2004  . Smokeless tobacco: Never Used  . Alcohol Use: Yes  . Drug Use: No  . Sexually Active: Not on file   Review of Systems General: No chills, fever, night sweats or weight changes Cardiovascular: No chest pain, edema, orthopnea, palpitations, paroxysmal nocturnal dyspnea Dermatological: No rash, lesions or masses Respiratory: No cough, dyspnea Urologic: No hematuria, dysuria Abdominal: No nausea, vomiting, diarrhea, bright red blood per rectum, melena, or hematemesis Neurologic: No visual changes, weakness, changes in mental status All other systems reviewed and are otherwise negative except as noted above.  Physical Exam Vitals: Blood pressure 137/84, pulse 63, height 5\' 9"  (1.753 m), weight 169 lb (76.658 kg).  General: Well developed, well appearing 67 y.o. male in no acute distress. HEENT: Normocephalic, atraumatic. EOMs intact. Sclera nonicteric. Oropharynx clear.  Neck: Supple without bruits. No JVD. Lungs: Respirations regular and unlabored, CTA bilaterally. No wheezes, rales  or rhonchi. Heart: RRR. S1, S2 present. III/VI systolic murmur. No rub, S3 or S4. Abdomen: Soft, non-distended.  Extremities: No clubbing, cyanosis or edema. PT/Radials 2+ and equal bilaterally. Psych: Normal affect. Neuro: Alert and oriented X 3. Moves all extremities spontaneously.   Diagnostics Cardiac catheterization 02/19/2013 Procedural Findings:  Hemodynamics (mmHg)  RA mean 5  RV 42/6  PA 42/14, mean 27  PCWP mean 12 with prominent V waves  LV 153/20  AO 149/70  Oxygen saturations:  PA 68%  AO 96%  Cardiac Output (Fick) 4.8  Cardiac Index (Fick) 2.5  PVR 3.13 WU  Coronary angiography:  Coronary dominance: right  Left mainstem: No significant disease.  Left anterior descending (LAD): The distal LAD tapers to small size but no significant obstructive disease.  Left circumflex (LCx): No significant disease.  Right coronary artery (RCA): No significant disease.  Left ventriculography: Left ventricular systolic function is normal, LVEF is estimated at 60-65% with no regional wall motion abnormalities in the RAO projection,  there is 3+ mitral regurgitation noted.  Final Conclusions: Mild pulmonary hypertension with prominent V-waves in the PCWP pressure tracing. 3+ MR by LV-gram (very eccentric MR in multiple jets on TEE), no obstructive coronary disease.  Recommendations: Given significant dyspnea and severe MR by echo, I will refer patient to CVTS for evaluation for redo MVR. Resume coumadin tonight.    Assessment and Plan 1. Severe, symptomatic bioprosthetic MR  - no change in symptoms  - awaiting redo MVR later this month 03/26/2013 with Dr. Cornelius Moras  - no changes made today 2. No significant CAD by recent cath 3. Preserved LV function  Signed, Rick Duff, PA-C 03/06/2013, 2:38 PM

## 2013-03-11 ENCOUNTER — Encounter (HOSPITAL_COMMUNITY): Payer: Self-pay | Admitting: Pharmacy Technician

## 2013-03-11 ENCOUNTER — Ambulatory Visit (INDEPENDENT_AMBULATORY_CARE_PROVIDER_SITE_OTHER): Payer: Medicare PPO | Admitting: *Deleted

## 2013-03-11 DIAGNOSIS — I4891 Unspecified atrial fibrillation: Secondary | ICD-10-CM

## 2013-03-11 DIAGNOSIS — Z7901 Long term (current) use of anticoagulants: Secondary | ICD-10-CM

## 2013-03-11 DIAGNOSIS — Z952 Presence of prosthetic heart valve: Secondary | ICD-10-CM

## 2013-03-11 DIAGNOSIS — I059 Rheumatic mitral valve disease, unspecified: Secondary | ICD-10-CM

## 2013-03-11 DIAGNOSIS — Z954 Presence of other heart-valve replacement: Secondary | ICD-10-CM

## 2013-03-11 LAB — POCT INR: INR: 2.4

## 2013-03-13 ENCOUNTER — Ambulatory Visit (HOSPITAL_COMMUNITY)
Admission: RE | Admit: 2013-03-13 | Discharge: 2013-03-13 | Disposition: A | Discharge status: Home / Self Care | Payer: Medicare PPO | Source: Ambulatory Visit | Attending: Thoracic Surgery (Cardiothoracic Vascular Surgery) | Admitting: Thoracic Surgery (Cardiothoracic Vascular Surgery)

## 2013-03-13 ENCOUNTER — Ambulatory Visit (HOSPITAL_COMMUNITY): Admission: RE | Admit: 2013-03-13 | Payer: Medicare PPO | Source: Ambulatory Visit

## 2013-03-13 DIAGNOSIS — I517 Cardiomegaly: Secondary | ICD-10-CM | POA: Insufficient documentation

## 2013-03-13 DIAGNOSIS — K5710 Diverticulosis of small intestine without perforation or abscess without bleeding: Secondary | ICD-10-CM | POA: Insufficient documentation

## 2013-03-13 DIAGNOSIS — I38 Endocarditis, valve unspecified: Secondary | ICD-10-CM | POA: Insufficient documentation

## 2013-03-13 DIAGNOSIS — I6529 Occlusion and stenosis of unspecified carotid artery: Secondary | ICD-10-CM | POA: Insufficient documentation

## 2013-03-13 DIAGNOSIS — Z954 Presence of other heart-valve replacement: Secondary | ICD-10-CM | POA: Insufficient documentation

## 2013-03-13 DIAGNOSIS — Z01810 Encounter for preprocedural cardiovascular examination: Secondary | ICD-10-CM

## 2013-03-13 DIAGNOSIS — J984 Other disorders of lung: Secondary | ICD-10-CM | POA: Insufficient documentation

## 2013-03-13 DIAGNOSIS — I700 Atherosclerosis of aorta: Secondary | ICD-10-CM | POA: Insufficient documentation

## 2013-03-13 DIAGNOSIS — I2510 Atherosclerotic heart disease of native coronary artery without angina pectoris: Secondary | ICD-10-CM | POA: Insufficient documentation

## 2013-03-13 DIAGNOSIS — I340 Nonrheumatic mitral (valve) insufficiency: Secondary | ICD-10-CM

## 2013-03-13 DIAGNOSIS — I701 Atherosclerosis of renal artery: Secondary | ICD-10-CM | POA: Insufficient documentation

## 2013-03-13 DIAGNOSIS — I658 Occlusion and stenosis of other precerebral arteries: Secondary | ICD-10-CM | POA: Insufficient documentation

## 2013-03-13 DIAGNOSIS — Z01811 Encounter for preprocedural respiratory examination: Secondary | ICD-10-CM | POA: Insufficient documentation

## 2013-03-13 DIAGNOSIS — I059 Rheumatic mitral valve disease, unspecified: Secondary | ICD-10-CM

## 2013-03-13 LAB — PULMONARY FUNCTION TEST

## 2013-03-13 MED ORDER — IOHEXOL 350 MG/ML SOLN
60.0000 mL | Freq: Once | INTRAVENOUS | Status: AC | PRN
  Administered 2013-03-13: 60 mL via INTRAVENOUS

## 2013-03-13 MED ORDER — ALBUTEROL SULFATE (5 MG/ML) 0.5% IN NEBU
2.5000 mg | INHALATION_SOLUTION | Freq: Once | RESPIRATORY_TRACT | Status: AC
  Administered 2013-03-13: 2.5 mg via RESPIRATORY_TRACT

## 2013-03-13 NOTE — Progress Notes (Signed)
VASCULAR LAB PRELIMINARY  PRELIMINARY  PRELIMINARY  PRELIMINARY  Pre-op Cardiac Surgery  Carotid Findings:  Bilateral 1% to 39% ICA stenosis. Vertebral artery flow is antegrade  Upper Extremity Right Left  Brachial Pressures 125 Biphasic 122 Biphasic  Radial Waveforms Biphasic Biphasic  Ulnar Waveforms Biphasic Biphasic  Palmar Arch (Allen's Test) Normal Normal    Findings:  Doppler waveforms remained normal bilaterally with both radial and ulnar compressions.   Dewayne Jurek, RVS 03/13/2013, 11:56 AM

## 2013-03-16 ENCOUNTER — Ambulatory Visit (INDEPENDENT_AMBULATORY_CARE_PROVIDER_SITE_OTHER): Payer: Medicare PPO | Admitting: Thoracic Surgery (Cardiothoracic Vascular Surgery)

## 2013-03-16 ENCOUNTER — Encounter: Payer: Self-pay | Admitting: Thoracic Surgery (Cardiothoracic Vascular Surgery)

## 2013-03-16 VITALS — BP 132/86 | HR 65 | Resp 16 | Ht 69.0 in | Wt 175.0 lb

## 2013-03-16 DIAGNOSIS — I059 Rheumatic mitral valve disease, unspecified: Secondary | ICD-10-CM

## 2013-03-16 DIAGNOSIS — Z952 Presence of prosthetic heart valve: Secondary | ICD-10-CM

## 2013-03-16 DIAGNOSIS — I340 Nonrheumatic mitral (valve) insufficiency: Secondary | ICD-10-CM

## 2013-03-16 DIAGNOSIS — Z954 Presence of other heart-valve replacement: Secondary | ICD-10-CM

## 2013-03-16 MED ORDER — AMIODARONE HCL 200 MG PO TABS: 200.0000 mg | ORAL_TABLET | Freq: Two times a day (BID) | ORAL | Status: DC

## 2013-03-16 NOTE — Progress Notes (Signed)
[address]       [place] [address]             [phone]     CARDIOTHORACIC SURGERY OFFICE NOTE  Referring Provider is [facility], Eliot Ford, MD PCP is Alva Garnet., MD   HPI:  Patient returns for followup of prosthetic valve dysfunction with severe mitral regurgitation. He was originally seen in consultation on 02/20/2013.  Since then he underwent CT angiogram of the chest abdomen and pelvis. He returns to the office today with tentatively plans to proceed with elective redo mitral valve replacement and possible maze procedure on 03/24/2013. Since he was last seen here in the office he reports no new problems or complaints. He continues to experience class III symptoms of exertional shortness of breath.   Current Outpatient Prescriptions  Medication Sig Dispense Refill  . Cholecalciferol (VITAMIN D3) 1000 UNITS CAPS Take 1 capsule by mouth daily.       Marland Kitchen ezetimibe (ZETIA) 10 MG tablet Take 1 tablet (10 mg total) by mouth daily.  30 tablet  6  . furosemide (LASIX) 20 MG tablet Take 1 tablet (20 mg total) by mouth daily.  30 tablet  6  . HYDROcodone-acetaminophen (LORTAB) 10-500 MG per tablet Take 1 tablet by mouth every 6 (six) hours as needed. For pain.      . metoprolol tartrate (LOPRESSOR) 25 MG tablet Take 1 tablet (25 mg total) by mouth 2 (two) times daily.  60 tablet  3  . omeprazole (PRILOSEC) 20 MG capsule Take 20 mg by mouth daily.       . potassium chloride SA (K-DUR,KLOR-CON) 20 MEQ tablet Take 20 mEq by mouth daily.      . ramipril (ALTACE) 5 MG capsule Take 1 capsule (5 mg total) by mouth daily.  30 capsule  6  . tiotropium (SPIRIVA) 18 MCG inhalation capsule Place 18 mcg into inhaler and inhale daily.      Marland Kitchen warfarin (COUMADIN) 5 MG tablet Take 5-7.5 mg by mouth daily. Take 1.5 tablet on Wed and Sat, take 1 tablet all other days      . zolpidem (AMBIEN) 5 MG tablet Take 5 mg by mouth at bedtime as needed. For insomnia.      Marland Kitchen amiodarone  (PACERONE) 200 MG tablet Take 1 tablet (200 mg total) by mouth 2 (two) times daily. Begin 7 days prior to surgery.  14 tablet  0   No current facility-administered medications for this visit.      Physical Exam:   BP 132/86  Pulse 65  Resp 16  Ht 5\' 9"  (1.753 m)  Wt 175 lb (79.379 kg)  BMI 25.83 kg/m2  SpO2 98%  General:  Somewhat frail but well-appearing  Chest:   Few scattered crackles, symmetrical breath sounds  CV:   Irregular rate and rhythm with systolic murmur  Incisions:  n/a  Abdomen:  Soft and nontender  Extremities:  Warm and well-perfused  Diagnostic Tests:  CT ANGIOGRAPHY CHEST, ABDOMEN AND PELVIS  Technique: Multidetector CT imaging through the chest, abdomen and  pelvis was performed using the standard protocol during bolus  administration of intravenous contrast. Multiplanar reconstructed  images including MIPs were obtained and reviewed to evaluate the  vascular anatomy.  Contrast: 60mL OMNIPAQUE IOHEXOL 350 MG/ML SOLN  Comparison: None.  CTA CHEST  Findings: The chest wall is unremarkable and stable. No  supraclavicular or axillary mass or adenopathy. The thyroid gland  is normal. The bony  thorax is intact. No destructive bone lesions  or spinal canal compromise. There are multiple thoracic and lumbar  compression fractures.  The heart is mildly enlarged but stable. No pericardial effusion.  Prosthetic mitral valve is noted. The ascending aorta is normal.  No dissection or focal aneurysm. The branch vessels are patent.  Mild tortuosity and atherosclerotic calcifications. The descending  aorta is normal in caliber. Mild tortuosity but no dissection.  Small scattered mediastinal and hilar lymph nodes but no  adenopathy. The esophagus is grossly normal.  Examination of the lung parenchyma demonstrates advanced  emphysematous changes and areas of pulmonary scarring. A marked  loss of volume in the left lung likely due to previous surgery with  surgical  changes involving the left ribs. No pleural effusion,  pulmonary edema or mass. Left basilar atelectasis and scarring  changes noted.  Review of the MIP images confirms the above findings.  IMPRESSION:  1. No CT findings for aortic dissection or aneurysm.  2. Mild scattered atherosclerotic calcifications involving the  aorta and branch vessels.  3. Advanced emphysematous changes and pulmonary scarring with  postsurgical volume loss involving the left lung.  4. Prosthetic mitral valve appears to be well situated without  complicating features.  CTA ABDOMEN AND PELVIS  Findings: The abdominal aorta demonstrates moderate  atherosclerotic calcifications. There are calcifications at the  renal artery ostia and probable mild left proximal renal artery  stenosis. No dissection or aneurysm. Advanced atherosclerotic  calcifications involving the proximal iliac arteries but no  aneurysm. The common femoral arteries appear normal.  The SMA and IMA are patent. The celiac axis is normal.  The solid abdominal organs are unremarkable. No mass lesions or  inflammatory changes. There are bilateral benign appearing renal  cysts.  The stomach, duodenum, small bowel and colon grossly normal without  oral contrast. A small duodenal diverticulum is noted. No  inflammatory changes or mass lesions. The appendix is normal. No  mesenteric or retroperitoneal mass or adenopathy. Moderate  diverticulosis of the sigmoid colon.  The bladder, prostate gland and seminal vesicles are unremarkable.  No pelvic mass, adenopathy or free pelvic fluid collections. No  inguinal mass or adenopathy.  The bony structures are intact. Remote L1 and L2 lumbar  compression fractures are noted.  Review of the MIP images confirms the above findings.  IMPRESSION:  1. Moderate atherosclerotic calcifications involving the aorta and  branch vessels but no focal abdominal aortic aneurysm or  dissection.  2. Advanced atherosclerotic  calcifications involving the proximal  iliac arteries.  3. No acute abdominal/pelvic findings, mass lesions or  lymphadenopathy.  Original Report Authenticated By: Rudie Meyer, M.D.   Pulmonary Function Tests  Baseline      Post-bronchodilator  FVC  2.85 L  (76% predicted) FVC  2.85 L  (76% predicted) FEV1  1.78 L  (64% predicted) FEV1  1.89 L  (68% predicted) FEF25-75 0.84 L  (38% predicted) FEF25-75 1.02 L  (47% predicted)  RV  2.35 L  (110% predicted) DLCO  Not done      Impression:  The patient has prosthetic valve dysfunction with severe mitral regurgitation having previously undergone mitral valve replacement using a porcine bioprosthesis in 1999. He presents with progressive symptoms of exertional shortness of breath. Left ventricular systolic function is mild to moderately reduced but pulmonary artery pressures are only mild to moderately elevated. The patient does not have significant coronary artery disease, risks associated with surgery will be somewhat elevated due to the patient's other  comorbid medical conditions including chronic obstructive pulmonary disease, atrial fibrillation and mildly limited physical mobility. The patient might benefit from concomitant Maze procedure if technically feasible at the time of surgery, although he appears to have a fairly long duration of chronic persistent atrial fibrillation dating back several years at least.  The patient does have COPD although he quit smoking in 2006.  CT angiogram of the chest abdomen and pelvis demonstrates the patient has suitable vascular access for minimally invasive approach for surgery including femoral artery cannulation.      Plan:  We plan to proceed with redo mitral valve replacement using a bioprosthetic tissue valve and possible maze procedure via minimally invasive right mini thoracotomy approach on Tuesday, 03/24/2013. The patient has been instructed to begin taking amiodarone tomorrow and stop  taking Coumadin after his dose on Wednesday, 03/18/2013.  The patient understands and accepts all potential associated risks of surgery including but not limited to risk of death, stroke, myocardial infarction, congestive heart failure, respiratory failure, renal failure, bleeding requiring blood transfusion and/or reexploration, arrhythmia, heart block or bradycardia requiring permanent pacemaker, pneumonia, pleural effusion, wound infection, pulmonary embolus or other thromboembolic complication, chronic pain or other delayed complications.  All questions answered.    Salvatore Decent. Cornelius Moras, MD 03/16/2013 6:10 PM

## 2013-03-16 NOTE — Patient Instructions (Addendum)
Take coumadin (warfarin) through Wednesday 8/20 then stop in anticipation of surgery.  Begin taking amiodarone tomorrow.  Continue all other medications through the day before surgery.  On the morning of surgery take only amiodarone, metoprolol and Prilosec with a sip of water

## 2013-03-17 ENCOUNTER — Emergency Department (HOSPITAL_COMMUNITY)
Admission: EM | Admit: 2013-03-17 | Discharge: 2013-03-17 | Disposition: A | Discharge status: Home / Self Care | Payer: Medicare PPO | Attending: Emergency Medicine | Admitting: Emergency Medicine

## 2013-03-17 ENCOUNTER — Encounter (HOSPITAL_COMMUNITY): Payer: Self-pay | Admitting: *Deleted

## 2013-03-17 DIAGNOSIS — Z7901 Long term (current) use of anticoagulants: Secondary | ICD-10-CM | POA: Insufficient documentation

## 2013-03-17 DIAGNOSIS — M25559 Pain in unspecified hip: Secondary | ICD-10-CM | POA: Insufficient documentation

## 2013-03-17 DIAGNOSIS — J4489 Other specified chronic obstructive pulmonary disease: Secondary | ICD-10-CM | POA: Insufficient documentation

## 2013-03-17 DIAGNOSIS — Z79899 Other long term (current) drug therapy: Secondary | ICD-10-CM | POA: Insufficient documentation

## 2013-03-17 DIAGNOSIS — K219 Gastro-esophageal reflux disease without esophagitis: Secondary | ICD-10-CM | POA: Insufficient documentation

## 2013-03-17 DIAGNOSIS — Z8719 Personal history of other diseases of the digestive system: Secondary | ICD-10-CM | POA: Insufficient documentation

## 2013-03-17 DIAGNOSIS — Z954 Presence of other heart-valve replacement: Secondary | ICD-10-CM | POA: Insufficient documentation

## 2013-03-17 DIAGNOSIS — M2550 Pain in unspecified joint: Secondary | ICD-10-CM | POA: Insufficient documentation

## 2013-03-17 DIAGNOSIS — D649 Anemia, unspecified: Secondary | ICD-10-CM

## 2013-03-17 DIAGNOSIS — Z8709 Personal history of other diseases of the respiratory system: Secondary | ICD-10-CM | POA: Insufficient documentation

## 2013-03-17 DIAGNOSIS — M79651 Pain in right thigh: Secondary | ICD-10-CM

## 2013-03-17 DIAGNOSIS — J449 Chronic obstructive pulmonary disease, unspecified: Secondary | ICD-10-CM | POA: Insufficient documentation

## 2013-03-17 DIAGNOSIS — I10 Essential (primary) hypertension: Secondary | ICD-10-CM | POA: Insufficient documentation

## 2013-03-17 DIAGNOSIS — I4891 Unspecified atrial fibrillation: Secondary | ICD-10-CM | POA: Insufficient documentation

## 2013-03-17 DIAGNOSIS — M159 Polyosteoarthritis, unspecified: Secondary | ICD-10-CM | POA: Insufficient documentation

## 2013-03-17 DIAGNOSIS — Z8679 Personal history of other diseases of the circulatory system: Secondary | ICD-10-CM | POA: Insufficient documentation

## 2013-03-17 DIAGNOSIS — D509 Iron deficiency anemia, unspecified: Secondary | ICD-10-CM | POA: Insufficient documentation

## 2013-03-17 DIAGNOSIS — Z8673 Personal history of transient ischemic attack (TIA), and cerebral infarction without residual deficits: Secondary | ICD-10-CM | POA: Insufficient documentation

## 2013-03-17 DIAGNOSIS — Z87891 Personal history of nicotine dependence: Secondary | ICD-10-CM | POA: Insufficient documentation

## 2013-03-17 DIAGNOSIS — Z87828 Personal history of other (healed) physical injury and trauma: Secondary | ICD-10-CM | POA: Insufficient documentation

## 2013-03-17 LAB — CBC WITH DIFFERENTIAL/PLATELET
HCT: 36.2 % — ABNORMAL LOW (ref 39.0–52.0)
Hemoglobin: 12.6 g/dL — ABNORMAL LOW (ref 13.0–17.0)
Lymphocytes Relative: 8 % — ABNORMAL LOW (ref 12–46)
Lymphs Abs: 0.7 10*3/uL (ref 0.7–4.0)
Monocytes Absolute: 1.2 10*3/uL — ABNORMAL HIGH (ref 0.1–1.0)
Monocytes Relative: 14 % — ABNORMAL HIGH (ref 3–12)
Neutro Abs: 6.7 10*3/uL (ref 1.7–7.7)
Neutrophils Relative %: 77 % (ref 43–77)
RBC: 3.92 MIL/uL — ABNORMAL LOW (ref 4.22–5.81)
WBC: 8.6 10*3/uL (ref 4.0–10.5)

## 2013-03-17 LAB — BASIC METABOLIC PANEL
BUN: 15 mg/dL (ref 6–23)
CO2: 25 mEq/L (ref 19–32)
Chloride: 98 mEq/L (ref 96–112)
Creatinine, Ser: 1.23 mg/dL (ref 0.50–1.35)
GFR calc Af Amer: 68 mL/min — ABNORMAL LOW (ref >90)
Potassium: 4 mEq/L (ref 3.5–5.1)

## 2013-03-17 LAB — URINALYSIS, ROUTINE W REFLEX MICROSCOPIC
Glucose, UA: NEGATIVE mg/dL
Ketones, ur: 15 mg/dL — AB
Leukocytes, UA: NEGATIVE
Nitrite: NEGATIVE
Specific Gravity, Urine: 1.012 (ref 1.005–1.030)
pH: 5 (ref 5.0–8.0)

## 2013-03-17 NOTE — ED Notes (Signed)
Pt had clean heart cath in July 2014 in preparation of surgery to replace previous heart valve

## 2013-03-17 NOTE — ED Notes (Signed)
C/o RUE & RLE pain upon awakening this morning at 1030. Denies falling or injury. No deformity noted. CSM intact, strength equal BUE, BLE. States pain much better now & completely gone to RLE, RUE "a little sore when I move it".  Family noticed today bruising/redness to right side of face with petechiae looking red spots to forehead & remainder of face. Denies any pain/soreness to face.  Denies dizziness, lightheadedness, CP.  Reports went fishing yesterday & felt fine. Also is scheduled to have mitral valve replacement next Tuesday

## 2013-03-17 NOTE — ED Notes (Signed)
Pt has had right arm and leg pain since he woke up this am.  No chest pain.  Pt is waiting for valve replacement next week.  No weakness and hurts to walk.

## 2013-03-17 NOTE — ED Provider Notes (Signed)
CSN: [id number]     Arrival date & time 03/17/13  1255 History     First MD Initiated Contact with Patient 03/17/13 1627     Chief Complaint  Patient presents with  . Rt arm and leg pain    (Consider location/radiation/quality/duration/timing/severity/associated sxs/prior Treatment) HPI Comments: 67 yo male with anemia, cardiomyopathy, a fib, stroke hx presents with right forearm and right thigh pain since this am that has resolved in the ED.  Severe ache initially/ cramp that is now gone. Was worse with movement.   Pt has had these randomly in the past.  No DVT hx. Pt holding coumadin for upcoming valve surgery on the 26th.  Pt face flushed today, denies recent etoh but has had this in the past.  No swelling or cp.  Pt has no sxs currently.  The history is provided by the patient.    Past Medical History  Diagnosis Date  . Iron deficiency anemia, unspecified   . 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 Cerebrovascular disease, unspecified   . Esophageal reflux   . Generalized osteoarthrosis, unspecified site   . Hypertension   . COPD (chronic obstructive pulmonary disease) 1999  . Lung nodules 2008, 2014    LLL nodule removed 2008 with LLL lobectomy,  RLL nodule 3mm observation  . Atrial fibrillation 11/11/2008    Qualifier: Diagnosis of  By: Riley Kill, MD, Johny Sax   . CARDIOMYOPATHY 10/20/2008    Qualifier: Diagnosis of  By: Manson Passey, RN, BSN, Lauren    . ISCHEMIC COLITIS, HX OF 10/20/2008    Qualifier: Diagnosis of  By: Manson Passey, RN, BSN, Lauren    . 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 HYPERTENSION, BENIGN 10/20/2008    Qualifier: Diagnosis of  By: Manson Passey, RN, BSN, Lauren    . Epidural hematoma 03/01/2011    Recent fall 2012   . Severe mitral regurgitation  02/20/2013   Past Surgical History  Procedure Laterality Date  . Mitral valve replacement  02/01/1998    Carpentier-Edwards porcine bioprosthetic tissue valve, size 31mm, placed for complicated bacterial endocarditis  . Video assisted thoracoscopy  04/29/2007    Left VATS w/ mini thoracotomy for Left Lower Lobectomy for benign lung nodules  . Tee without cardioversion N/A 02/12/2013    Procedure: TRANSESOPHAGEAL ECHOCARDIOGRAM (TEE);  Surgeon: Pricilla Riffle, MD;  Location: [facility] END[facility];  Service: Cardiovascular;  Laterality: N/A;   No family history on file. History  Substance Use Topics  . Smoking status: Former Smoker -- 2.00 packs/day for 40 years    Types: Cigarettes    Quit date: 07/30/2004  . Smokeless tobacco: Never Used  . Alcohol Use: Yes    Review of Systems  Constitutional: Negative for fever and chills.  HENT: Negative for neck pain and neck stiffness.   Eyes: Negative for visual disturbance.  Respiratory: Negative for shortness of breath.   Cardiovascular: Negative for chest pain and leg swelling.  Gastrointestinal: Negative for vomiting and abdominal pain.  Genitourinary: Negative for dysuria and flank pain.  Musculoskeletal: Positive for arthralgias. Negative for back pain.  Skin: Negative for rash.  Neurological: Negative for light-headedness and headaches.    Allergies  Rosuvastatin  Home Medications   Current Outpatient Rx  Name  Route  Sig  Dispense  Refill  . amiodarone (  PACERONE) 200 MG tablet   Oral   Take 1 tablet (200 mg total) by mouth 2 (two) times daily. Begin 7 days prior to surgery.   14 tablet   0   . Cholecalciferol (VITAMIN D3) 1000 UNITS CAPS   Oral   Take 1 capsule by mouth daily.          Marland Kitchen ezetimibe (ZETIA) 10 MG tablet   Oral   Take 1 tablet (10 mg total) by mouth daily.   30 tablet   6   . furosemide (LASIX) 20 MG tablet   Oral   Take 1 tablet (20 mg total) by mouth daily.   30 tablet   6   . HYDROcodone-acetaminophen  (LORTAB) 10-500 MG per tablet   Oral   Take 1 tablet by mouth every 6 (six) hours as needed. For pain.         . metoprolol tartrate (LOPRESSOR) 25 MG tablet   Oral   Take 1 tablet (25 mg total) by mouth 2 (two) times daily.   60 tablet   3   . omeprazole (PRILOSEC) 20 MG capsule   Oral   Take 20 mg by mouth daily.          . potassium chloride SA (K-DUR,KLOR-CON) 20 MEQ tablet   Oral   Take 20 mEq by mouth daily.         . ramipril (ALTACE) 5 MG capsule   Oral   Take 1 capsule (5 mg total) by mouth daily.   30 capsule   6   . tiotropium (SPIRIVA) 18 MCG inhalation capsule   Inhalation   Place 18 mcg into inhaler and inhale daily.         Marland Kitchen warfarin (COUMADIN) 5 MG tablet   Oral   Take 5-10 mg by mouth daily. Take 1.5 tablet on Wed and Sat, take 1 tablet all other days         . zolpidem (AMBIEN) 5 MG tablet   Oral   Take 5 mg by mouth at bedtime as needed. For insomnia.          BP 137/68  Pulse 66  Temp(Src) 97.9 F (36.6 C) (Oral)  Resp 18  SpO2 97% Physical Exam  Nursing note and vitals reviewed. Constitutional: He is oriented to person, place, and time. He appears well-developed and well-nourished.  HENT:  Head: Normocephalic and atraumatic.  Eyes: Conjunctivae are normal. Right eye exhibits no discharge. Left eye exhibits no discharge.  Neck: Normal range of motion. Neck supple. No tracheal deviation present.  Cardiovascular: Normal rate and regular rhythm.   Pulmonary/Chest: Effort normal and breath sounds normal.  Abdominal: Soft. He exhibits no distension. There is no tenderness. There is no guarding.  Musculoskeletal: He exhibits no edema and no tenderness.  Neurological: He is alert and oriented to person, place, and time.  Skin: Skin is warm. Rash (non blanching deep erythema on face, no induration, discharge or warmth, supple neck) noted.  Psychiatric: He has a normal mood and affect.    ED Course   Procedures (including critical  care time) Emergency Ultrasound Study:  Limited Duplex of right lower extremity veins  Performed by Dr. Jodi Mourning Indication: leg pain right thigh Visualization of saphenous-femoral junction, proximal femoral vein and popliteal vein regions in transverse plane with full compression visualized.  Interpretation right LE no dvt visualized.  Images archived electronically.   Labs Reviewed  URINALYSIS, ROUTINE W REFLEX MICROSCOPIC - Abnormal; Notable for the  following:    Ketones, ur 15 (*)    All other components within normal limits  CBC WITH DIFFERENTIAL - Abnormal; Notable for the following:    RBC 3.92 (*)    Hemoglobin 12.6 (*)    HCT 36.2 (*)    Lymphocytes Relative 8 (*)    Monocytes Relative 14 (*)    Monocytes Absolute 1.2 (*)    All other components within normal limits  BASIC METABOLIC PANEL - Abnormal; Notable for the following:    Sodium 133 (*)    Glucose, Bld 108 (*)    GFR calc non Af Amer 59 (*)    GFR calc Af Amer 68 (*)    All other components within normal limits  PROTIME-INR - Abnormal; Notable for the following:    Prothrombin Time 24.7 (*)    INR 2.32 (*)    All other components within normal limits   No results found. No diagnosis found.  MDM   No sxs in ED.  Pt had severe right thigh ache.    Date: 03/17/2013  Rate: 64  Rhythm: indeterminate sinus vs atrial  QRS Axis: right  Intervals: PR prolonged  ST/T Wave abnormalities: nonspecific ST changes  Conduction Disutrbances:first-degree A-V block   Narrative Interpretation:   Old EKG Reviewed: changes noted   Bedside US no DVT appreciated.  Advised avoiding etoh.  Fup discussed. No cp or sob.   DC  Enid Skeens, MD 03/17/13 1820

## 2013-03-20 ENCOUNTER — Encounter (HOSPITAL_COMMUNITY): Payer: Self-pay

## 2013-03-20 ENCOUNTER — Encounter (HOSPITAL_COMMUNITY)
Admission: RE | Admit: 2013-03-20 | Discharge: 2013-03-20 | Disposition: A | Discharge status: Home / Self Care | Payer: Medicare PPO | Source: Ambulatory Visit | Attending: Thoracic Surgery (Cardiothoracic Vascular Surgery) | Admitting: Thoracic Surgery (Cardiothoracic Vascular Surgery)

## 2013-03-20 ENCOUNTER — Ambulatory Visit (HOSPITAL_COMMUNITY)
Admission: RE | Admit: 2013-03-20 | Discharge: 2013-03-20 | Disposition: A | Discharge status: Home / Self Care | Payer: Medicare PPO | Source: Ambulatory Visit | Attending: Thoracic Surgery (Cardiothoracic Vascular Surgery) | Admitting: Thoracic Surgery (Cardiothoracic Vascular Surgery)

## 2013-03-20 VITALS — BP 131/85 | HR 66 | Temp 98.2°F | Resp 18 | Ht 69.0 in | Wt 174.5 lb

## 2013-03-20 DIAGNOSIS — I4891 Unspecified atrial fibrillation: Secondary | ICD-10-CM

## 2013-03-20 DIAGNOSIS — Z01818 Encounter for other preprocedural examination: Secondary | ICD-10-CM | POA: Insufficient documentation

## 2013-03-20 DIAGNOSIS — Z01812 Encounter for preprocedural laboratory examination: Secondary | ICD-10-CM | POA: Insufficient documentation

## 2013-03-20 DIAGNOSIS — M8448XA Pathological fracture, other site, initial encounter for fracture: Secondary | ICD-10-CM | POA: Insufficient documentation

## 2013-03-20 DIAGNOSIS — I059 Rheumatic mitral valve disease, unspecified: Secondary | ICD-10-CM

## 2013-03-20 LAB — BLOOD GAS, ARTERIAL
Drawn by: 344381
FIO2: 0.21 %
Patient temperature: 98.6
TCO2: 23.9 mmol/L (ref 0–100)
pCO2 arterial: 35.2 mmHg (ref 35.0–45.0)
pH, Arterial: 7.427 (ref 7.350–7.450)

## 2013-03-20 LAB — SURGICAL PCR SCREEN
MRSA, PCR: NEGATIVE
Staphylococcus aureus: NEGATIVE

## 2013-03-20 LAB — CBC
HCT: 35.9 % — ABNORMAL LOW (ref 39.0–52.0)
MCHC: 34 g/dL (ref 30.0–36.0)
Platelets: 160 10*3/uL (ref 150–400)
RDW: 13.2 % (ref 11.5–15.5)
WBC: 8.2 10*3/uL (ref 4.0–10.5)

## 2013-03-20 LAB — COMPREHENSIVE METABOLIC PANEL
AST: 24 U/L (ref 0–37)
Albumin: 4 g/dL (ref 3.5–5.2)
Alkaline Phosphatase: 80 U/L (ref 39–117)
BUN: 7 mg/dL (ref 6–23)
Chloride: 97 mEq/L (ref 96–112)
Creatinine, Ser: 1.43 mg/dL — ABNORMAL HIGH (ref 0.50–1.35)
Potassium: 4 mEq/L (ref 3.5–5.1)
Total Protein: 6.6 g/dL (ref 6.0–8.3)

## 2013-03-20 LAB — HEMOGLOBIN A1C: Mean Plasma Glucose: 108 mg/dL (ref <117)

## 2013-03-20 LAB — URINALYSIS, ROUTINE W REFLEX MICROSCOPIC
Bilirubin Urine: NEGATIVE
Hgb urine dipstick: NEGATIVE
Ketones, ur: NEGATIVE mg/dL
Protein, ur: NEGATIVE mg/dL
Specific Gravity, Urine: 1.004 — ABNORMAL LOW (ref 1.005–1.030)
Urobilinogen, UA: 0.2 mg/dL (ref 0.0–1.0)

## 2013-03-20 LAB — APTT: aPTT: 33 seconds (ref 24–37)

## 2013-03-20 NOTE — Progress Notes (Addendum)
Ryan paged.  No need to talk with patient

## 2013-03-20 NOTE — Progress Notes (Signed)
Pt chart left for Rand, [place] ( anesthesia) to review labs; PT INR are abnormal.

## 2013-03-20 NOTE — Pre-Procedure Instructions (Addendum)
Jeffery Copeland  03/20/2013   Your procedure is scheduled on:  Tuesday, August 26th  Report to [facility] at 0530 AM.  Call this number if you have problems the morning of surgery: [phone]   Remember:   Do not eat food or drink liquids after midnight.   Take these medicines the morning of surgery with A SIP OF WATER: Amiodarone, Lopressor, Prilosec, Spiriva   Do not wear jewelry.  Do not wear lotions, powders, or perfumes. You may wear deodorant.  Do not shave 48 hours prior to surgery. Men may shave face and neck.  Do not bring valuables to the hospital.  [facility] is not responsible   for any belongings or valuables.  Contacts, dentures or bridgework may not be worn into surgery.  Leave suitcase in the car. After surgery it may be brought to your room.  For patients admitted to the hospital, checkout time is 11:00 AM the day of discharge.   Patients discharged the day of surgery will not be allowed to drive home.   Special Instructions: Shower using CHG 2 nights before surgery and the night before surgery.  If you shower the day of surgery use CHG.  Use special wash - you have one bottle of CHG for all showers.  You should use approximately 1/3 of the bottle for each shower.   Please read over the following fact sheets that you were given: Pain Booklet, Coughing and Deep Breathing, Blood Transfusion Information, MRSA Information and Surgical Site Infection Prevention

## 2013-03-23 ENCOUNTER — Other Ambulatory Visit: Payer: Self-pay | Admitting: Cardiology

## 2013-03-23 MED ORDER — DEXTROSE 5 % IV SOLN: 1.5000 g | INTRAVENOUS | Status: AC

## 2013-03-23 MED ORDER — DOPAMINE-DEXTROSE 3.2-5 MG/ML-% IV SOLN
2.0000 ug/kg/min | INTRAVENOUS | Status: AC
  Administered 2013-03-24: 3 ug/kg/min via INTRAVENOUS
  Filled 2013-03-23: qty 250

## 2013-03-23 MED ORDER — PHENYLEPHRINE HCL 10 MG/ML IJ SOLN
30.0000 ug/min | INTRAMUSCULAR | Status: AC
  Administered 2013-03-24: 50 ug/min via INTRAVENOUS
  Filled 2013-03-23 (×2): qty 2

## 2013-03-23 MED ORDER — PHENYLEPHRINE HCL 10 MG/ML IJ SOLN: 30.0000 ug/min | INTRAMUSCULAR | Status: AC

## 2013-03-23 MED ORDER — DEXTROSE 5 % IV SOLN
1.5000 g | INTRAVENOUS | Status: AC
  Administered 2013-03-24: 1.5 g via INTRAVENOUS
  Filled 2013-03-23 (×2): qty 1.5

## 2013-03-23 MED ORDER — POTASSIUM CHLORIDE 2 MEQ/ML IV SOLN
80.0000 meq | INTRAVENOUS | Status: DC
  Filled 2013-03-23 (×2): qty 40

## 2013-03-23 MED ORDER — SODIUM CHLORIDE 0.9 % IV SOLN
INTRAVENOUS | Status: DC
  Filled 2013-03-23 (×2): qty 30

## 2013-03-23 MED ORDER — VANCOMYCIN HCL 10 G IV SOLR: 1250.0000 mg | INTRAVENOUS | Status: AC

## 2013-03-23 MED ORDER — EPINEPHRINE HCL 1 MG/ML IJ SOLN
0.5000 ug/min | INTRAVENOUS | Status: DC
  Filled 2013-03-23 (×2): qty 4

## 2013-03-23 MED ORDER — SODIUM CHLORIDE 0.9 % IV SOLN: INTRAVENOUS | Status: AC

## 2013-03-23 MED ORDER — NITROGLYCERIN IN D5W 200-5 MCG/ML-% IV SOLN
2.0000 ug/min | INTRAVENOUS | Status: AC
  Administered 2013-03-24: 5 ug/min via INTRAVENOUS
  Filled 2013-03-23 (×2): qty 250

## 2013-03-23 MED ORDER — SODIUM CHLORIDE 0.9 % IV SOLN
INTRAVENOUS | Status: AC
  Administered 2013-03-24: 1.8 [IU]/h via INTRAVENOUS
  Filled 2013-03-23 (×2): qty 1

## 2013-03-23 MED ORDER — DEXMEDETOMIDINE HCL IN NACL 400 MCG/100ML IV SOLN
0.1000 ug/kg/h | INTRAVENOUS | Status: AC
  Administered 2013-03-24: .3 ug/kg/h via INTRAVENOUS
  Filled 2013-03-23 (×2): qty 100

## 2013-03-23 MED ORDER — VANCOMYCIN HCL 10 G IV SOLR
1250.0000 mg | INTRAVENOUS | Status: DC
  Filled 2013-03-23: qty 1250

## 2013-03-23 MED ORDER — MAGNESIUM SULFATE 50 % IJ SOLN
40.0000 meq | INTRAMUSCULAR | Status: DC
  Filled 2013-03-23 (×2): qty 10

## 2013-03-23 MED ORDER — SODIUM CHLORIDE 0.9 % IV SOLN
INTRAVENOUS | Status: DC
  Filled 2013-03-23: qty 30

## 2013-03-23 MED ORDER — CEFUROXIME SODIUM 1.5 G IJ SOLR
1.5000 g | INTRAMUSCULAR | Status: DC
  Filled 2013-03-23: qty 1.5

## 2013-03-23 MED ORDER — VANCOMYCIN HCL 10 G IV SOLR
1250.0000 mg | INTRAVENOUS | Status: AC
  Administered 2013-03-24: 1250 mg via INTRAVENOUS
  Filled 2013-03-23: qty 1250

## 2013-03-23 MED ORDER — NOREPINEPHRINE BITARTRATE 1 MG/ML IJ SOLN
0.0000 ug/min | INTRAVENOUS | Status: DC
  Filled 2013-03-23 (×2): qty 8

## 2013-03-23 MED ORDER — NOREPINEPHRINE BITARTRATE 1 MG/ML IJ SOLN
0.0000 ug/min | INTRAVENOUS | Status: DC
  Filled 2013-03-23: qty 8

## 2013-03-23 NOTE — H&P (Signed)
[address]       [place] [address]             [phone]          CARDIOTHORACIC SURGERY HISTORY AND PHYSICAL EXAM  Referring Provider is Laurey Morale, MD PCP is Alva Garnet., MD    Chief Complaint   Patient presents with   .  Mitral Regurgitation       Surgical eval for re-do of MVR, cardiac cath 02/19/13, ECHO 02/12/13     HPI:  Patient is a 67 year old divorced white male from [place] who underwent mitral valve replacement using a porcine bioprosthetic tissue valve in 1999 for complicated bacterial endocarditis.  Findings at the time of surgery were consistent with likely chronic mitral valve prolapse with mitral regurgitation that subsequently became complicated by acute and subacute bacterial endocarditis with group G. Streptococcus. A bioprosthetic tissue valve was chosen at the time because of the patient's ongoing troubles with alcohol abuse and history of seizure disorder.  The patient recovered uneventfully.  The patient has been followed by Dr. Riley Kill for several years with history of persistent atrial fibrillation on chronic Coumadin therapy.  He has done reasonably well with this, although apparently he did suffer an epidural hematoma in 2012 at which time he was notably supratherapeutic on Coumadin.  Over the past 2 years the patient has developed progressive symptoms of exertional shortness of breath.  He was noted to have a prominent systolic murmur on exam by both Dr. Riley Kill and Dr. Delford Field, but transthoracic echocardiogram performed in February of this year did not demonstrate much of any mitral regurgitation.  The patient was subsequently seen in followup earlier this month by Dr. Shirlee Latch who reviewed the patient's most recent transthoracic echocardiogram and noted that the presence of significant acoustic shadowing might be missing the presence of significant mitral regurgitation. Subsequently a transesophageal echocardiogram was  performed 02/12/2013 by Dr. Tenny Craw. 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 Left and right heart catheterization was performed by Dr. Shirlee Latch yesterday. This confirmed the absence of significant coronary artery disease and was notable for the presence of mild pulmonary hypertension with prominent V waves on pulmonary capillary wedge tracing. The patient was referred for surgical consultation.  He was originally seen in consultation on 02/20/2013.  Since then he underwent CT angiogram of the chest abdomen and pelvis. He returns to the office today with tentatively plans to proceed with elective redo mitral valve replacement and possible maze procedure on 03/24/2013.   The patient lives alone and remained functionally independent. He states that he gave up drinking 2 years ago, and he quit smoking approximately 8 years ago. He describes a 2 year history of progressive exertional shortness of breath. He denies resting shortness of breath, PND, orthopnea, or lower extremity edema. He has occasional episodes of atypical chest pain that seemed to come and go without particular relation to activity or stress. Patient has occasional palpitations. He denies any history of dizzy spells or syncope. He reports that he is a bit unsteady on his feet at times and he has had multiple falls in the past.   Past Medical History  Diagnosis Date  . 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 Cerebrovascular disease, unspecified   . Esophageal reflux   . Generalized osteoarthrosis, unspecified site   . Hypertension   . COPD (chronic obstructive pulmonary disease)  1999  . Lung nodules 2008, 2014    LLL nodule removed 2008 with LLL lobectomy,  RLL nodule 3mm observation  . Atrial fibrillation 11/11/2008    Qualifier: Diagnosis of  By: Riley Kill, MD, Johny Sax   . CARDIOMYOPATHY 10/20/2008    Qualifier: Diagnosis of  By: Manson Passey,  RN, BSN, Lauren    . ISCHEMIC COLITIS, HX OF 10/20/2008    Qualifier: Diagnosis of  By: Manson Passey, RN, BSN, Lauren    . 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 HYPERTENSION, BENIGN 10/20/2008    Qualifier: Diagnosis of  By: Manson Passey, RN, BSN, Lauren    . Epidural hematoma 03/01/2011    Recent fall 2012   . Severe mitral regurgitation 02/20/2013  . Iron deficiency anemia, unspecified     Past Surgical History  Procedure Laterality Date  . Mitral valve replacement  02/01/1998    Carpentier-Edwards porcine bioprosthetic tissue valve, size 31mm, placed for complicated bacterial endocarditis  . Video assisted thoracoscopy  04/29/2007    Left VATS w/ mini thoracotomy for Left Lower Lobectomy for benign lung nodules  . Tee without cardioversion N/A 02/12/2013    Procedure: TRANSESOPHAGEAL ECHOCARDIOGRAM (TEE);  Surgeon: Pricilla Riffle, MD;  Location: [facility];  Service: Cardiovascular;  Laterality: N/A;    No family history on file.  Social History History  Substance Use Topics  . Smoking status: Former Smoker -- 2.00 packs/day for 40 years    Types: Cigarettes    Quit date: 07/30/2004  . Smokeless tobacco: Never Used  . Alcohol Use: Yes    Prior to Admission medications   Medication Sig Start Date End Date Taking? Authorizing Provider  Cholecalciferol (VITAMIN D3) 1000 UNITS CAPS Take 1 capsule by mouth daily.    Yes Historical Provider, MD  ezetimibe (ZETIA) 10 MG tablet Take 1 tablet (10 mg total) by mouth daily. 10/30/12  Yes Herby Abraham, MD  furosemide (LASIX) 20 MG tablet Take 1 tablet (20 mg total) by mouth daily. 11/27/12  Yes Herby Abraham, MD  HYDROcodone-acetaminophen (LORTAB) 10-500 MG per tablet Take 1 tablet by mouth every 6 (six) hours as needed. For pain.   Yes Historical Provider, MD    metoprolol tartrate (LOPRESSOR) 25 MG tablet Take 1 tablet (25 mg total) by mouth 2 (two) times daily. 11/28/12  Yes Herby Abraham, MD  omeprazole (PRILOSEC) 20 MG capsule Take 20 mg by mouth daily.    Yes Historical Provider, MD  potassium chloride SA (K-DUR,KLOR-CON) 20 MEQ tablet Take 20 mEq by mouth daily.   Yes Historical Provider, MD  ramipril (ALTACE) 5 MG capsule Take 1 capsule (5 mg total) by mouth daily. 11/27/12  Yes Herby Abraham, MD  tiotropium (SPIRIVA) 18 MCG inhalation capsule Place 18 mcg into inhaler and inhale daily.   Yes Historical Provider, MD  zolpidem (AMBIEN) 5 MG tablet Take 5 mg by mouth at bedtime as needed. For insomnia.   Yes Historical Provider, MD  amiodarone (PACERONE) 200 MG tablet Take 1 tablet (200 mg total) by mouth 2 (two) times daily. Begin 7 days prior to surgery. 03/16/13   Purcell Nails, MD  warfarin (COUMADIN) 5 MG tablet Take 5-7.5 mg by mouth daily. Take 5mg  daily except 7.5mg  on Wednesdays and Saturdays    Historical Provider, MD    Allergies  Allergen Reactions  .  Rosuvastatin Other (See Comments)    Muscle aches    Review of Systems: Review of Systems:              General:                      normal appetite, decreased energy, no weight gain, no weight loss, no fever             Cardiac:                      no chest pain with exertion, occasional brief chest pain at rest, +SOB with exertion, no resting SOB, no PND, no orthopnea, + palpitations, + arrhythmia, + atrial fibrillation, no LE edema, no dizzy spells, no syncope             Respiratory:                + exertional shortness of breath, no home oxygen, occasional productive cough, frequent dry cough, no bronchitis, + wheezing, no hemoptysis, no asthma, no pain with inspiration or cough, no sleep apnea, no CPAP at night             GI:                                no difficulty swallowing, no reflux, no frequent heartburn, no hiatal hernia, no abdominal pain, no constipation, no  diarrhea, no hematochezia, no hematemesis, no melena             GU:                              no dysuria,  no frequency, no urinary tract infection, no hematuria, + enlarged prostate, no kidney stones, no kidney disease             Vascular:                     no pain suggestive of claudication, + pain in feet, occasional leg cramps, no varicose veins, no DVT, no non-healing foot ulcer             Neuro:                         no stroke, no TIA's, remote seizures, no headaches, no temporary blindness one eye,  no slurred speech, no peripheral neuropathy, + chronic pain, + mild instability of gait, no memory/cognitive dysfunction             Musculoskeletal:         + arthritis, no joint swelling, no myalgias, mild difficulty walking, mildly limited mobility               Skin:                            no rash, no itching, no skin infections, no pressure sores or ulcerations             Psych:                         no anxiety, no depression, no nervousness, no unusual recent stress             Eyes:  no blurry vision, no floaters, no recent vision changes, + wears glasses or contacts             ENT:                            no hearing loss, no loose or painful teeth, edentulous w/ full set dentures             Hematologic:               no easy bruising, no abnormal bleeding, no clotting disorder, no frequent epistaxis             Endocrine:                   no diabetes, does not check CBG's at home                           Physical Exam:              BP 130/81  Pulse 64  Resp 20  Ht 5\' 9"  (1.753 m)  Wt 175 lb (79.379 kg)  BMI 25.83 kg/m2  SpO2 97%             General:                      Mildly frail-appearing             HEENT:                       Unremarkable               Neck:                           no JVD, no bruits, no adenopathy               Chest:                         clear to auscultation, symmetrical breath sounds, no wheezes, no  rhonchi               CV:                              RRR, grade IV/VI holosystolic murmur               Abdomen:                    soft, non-tender, no masses               Extremities:                 warm, well-perfused, pulses mildly diminished, no LE edema             Rectal/GU                   Deferred             Neuro:                         Grossly non-focal and symmetrical throughout             Skin:  Clean and dry, no rashes, no breakdown   Diagnostic Tests:  Transthoracic Echocardiography  Patient: Tydarius, Yawn MR #: [id number] Study Date: 09/02/2012 Gender: M Age: 78 Height: 175.3cm Weight: 79.4kg BSA: 1.19m^2 Pt. Status: Room:  [facility] REFERRING Shawnie Pons ATTENDING Olga Millers SONOGRAPHER Aida Raider, RDCS PERFORMING [facility], [facility] cc:  ------------------------------------------------------------ LV EF: 55% - 60%  ------------------------------------------------------------ Indications: 424.0 Mitral valve disease.  ------------------------------------------------------------ History: PMH: Acquired from the patient and from the patient's chart. PMH: Carotid Artery Stenosis. History of Rheumatic Mitral Stenosis. Atrial Fibrillation/Atrial Flutter. History of Bacterial Endocarditis. Cardiomyopathy. Cerebrovascular Disease. COPD. Degenerative joint Disease. Risk factors: Former tobacco use. Hypertension.  ------------------------------------------------------------ Study Conclusions  - Left ventricle: The cavity size was normal. Wall thickness was normal. Systolic function was normal. The estimated ejection fraction was in the range of 55% to 60%. Wall motion was normal; there were no regional wall motion abnormalities. - Mitral valve: A bioprosthesis was present. - Left atrium: The atrium was severely dilated. - Right ventricle: The cavity size was mildly dilated. Impressions:  -  S/P MVR; mean gradient of 8 mmHg and MVA by pressure half time of 1.4 cm2.  ------------------------------------------------------------ Labs, prior tests, procedures, and surgery: Mitral Valve Replacement. Transthoracic echocardiography. M-mode, complete 2D, spectral Doppler, and color Doppler. Height: Height: 175.3cm. Height: 69in. Weight: Weight: 79.4kg. Weight: 174.6lb. Body mass index: BMI: 25.8kg/m^2. Body surface area: BSA: 1.84m^2. Blood pressure: 115/74. Patient status: Outpatient. Location: [facility]  ------------------------------------------------------------  ------------------------------------------------------------ Left ventricle: The cavity size was normal. Wall thickness was normal. Systolic function was normal. The estimated ejection fraction was in the range of 55% to 60%. Wall motion was normal; there were no regional wall motion abnormalities.  ------------------------------------------------------------ Aortic valve: Trileaflet; mildly thickened leaflets. Mobility was not restricted. Doppler: Transvalvular velocity was within the normal range. There was no stenosis. No regurgitation.  ------------------------------------------------------------ Aorta: Aortic root: The aortic root was normal in size.  ------------------------------------------------------------ Mitral valve: A bioprosthesis was present. Doppler: No regurgitation. Valve area by pressure half-time: 1.38cm^2. Indexed valve area by pressure half-time: 0.71cm^2/m^2. Valve area by continuity equation (using LVOT flow): 1.04cm^2. Indexed valve area by continuity equation (using LVOT flow): 0.53cm^2/m^2. Mean gradient: 9mm Hg (D). Peak gradient: 19mm Hg (D).  ------------------------------------------------------------ Left atrium: The atrium was severely dilated.  ------------------------------------------------------------ Right ventricle: The cavity size was mildly  dilated. Systolic function was normal.  ------------------------------------------------------------ Pulmonic valve: Doppler: Transvalvular velocity was within the normal range. There was no evidence for stenosis.  ------------------------------------------------------------ Tricuspid valve: Structurally normal valve. Doppler: Transvalvular velocity was within the normal range. Trivial regurgitation.  ------------------------------------------------------------ Right atrium: The atrium was at the upper limits of normal in size.  ------------------------------------------------------------ Pericardium: There was no pericardial effusion.  ------------------------------------------------------------ Systemic veins: Inferior vena cava: The vessel was normal in size.  ------------------------------------------------------------  2D measurements Normal Doppler measurements Normal Left ventricle Left ventricle LVID ED, 47.1 mm 43-52 Ea, lat 6.69 cm/s ------ chord, ann, tiss PLAX DP LVID ES, 32.7 mm 23-38 Ea, med 7.35 cm/s ------ chord, ann, tiss PLAX DP FS, chord, 31 % >29 LVOT PLAX Peak vel, 106 cm/s ------ LVPW, ED 10.6 mm ------ S IVS/LVPW 1 <1.3 VTI, S 19.1 cm ------ ratio, ED Stroke 72.6 ml ------ Ventricular septum vol IVS, ED 10.6 mm ------ Stroke 37.2 ml/m^2 ------ LVOT index Diam, S 22 mm ------ Mitral valve Area 3.8 cm^2 ------ Mean vel, 138 cm/s ------ Diam 22 mm ------ D Aorta Pressure 160 ms ------ Root diam, 37  mm ------ half-time ED Mean 9 mm Hg ------ Left atrium gradient, AP dim 71 mm ------ D AP dim 3.64 cm/m^2 <2.2 Peak 19 mm Hg ------ index gradient, D Area 1.38 cm^2 ------ (PHT) Area 0.71 cm^2/m^ ------ index 2 (PHT) Area 1.04 cm^2 ------ (LVOT) continuit y Area 0.53 cm^2/m^ ------ index 2 (LVOT cont) Annulus 69.6 cm ------ VTI Right ventricle Sa vel, 10.4 cm/s ------ lat ann, tiss  DP  ------------------------------------------------------------ Prepared and Electronically Authenticated by  Olga Millers 2014-02-04T15:07:[id number]    Transesophageal Echocardiography  Patient: Shalin, Vonbargen MR #: [id number] Study Date: 02/12/2013 Gender: M Age: 62 Height: 175.3cm Weight: 79.5kg BSA: 1.28m^2 Pt. Status: Room: [facility]  PERFORMING Dietrich Pates ORDERING Barrett, Rhonda REFERRING Barrett, Anthonette Legato, [place] ATTENDING Marca Ancona SONOGRAPHER Nolon Rod, RDCS cc:  ------------------------------------------------------------  ------------------------------------------------------------ Indications: Mitral regurgitation 424.0.  ------------------------------------------------------------ Study Conclusions  - Mitral valve: MV prosthesis is mildly thickened. Peak and mean gradients through the valve are 22 and 5 mm Hg respectively MVA by P T1/2 is 1.4 cm2. There are 3 prominent jets of MR One is central, one more anterior. One is eccentric and courses towards back of LA Overall MR is 3+ -4/4 with some late systolic backflow into the pulmonary veins. - Left atrium: No evidence of thrombus in the atrial cavity or appendage. Transesophageal echocardiography. 2D and color Doppler. Height: Height: 175.3cm. Height: 69in. Weight: Weight: 79.5kg. Weight: 175lb. Body mass index: BMI: 25.9kg/m^2. Body surface area: BSA: 1.86m^2. Blood pressure: 141/23. Patient status: Outpatient. Location: Endoscopy.  ------------------------------------------------------------  ------------------------------------------------------------ Left ventricle: LVEF is normal.  ------------------------------------------------------------ Aortic valve: AV is normal. No AI.  ------------------------------------------------------------ Mitral valve: MV prosthesis is mildly thickened. Peak and mean gradients through the valve are 22 and 5 mm Hg respectively MVA by P  T1/2 is 1.4 cm2. There are 3 prominent jets of MR One is central, one more anterior. One is eccentric and courses towards back of LA Overall MR is 3+ -4/4 with some late systolic backflow into the pulmonary veins. Doppler: Valve area by pressure half-time: 1.43cm^2. Indexed valve area by pressure half-time: 0.72cm^2/m^2.  ------------------------------------------------------------ Left atrium: LA and LA appendage are very large. No evidence of thrombus in the atrial cavity or appendage.  ------------------------------------------------------------ Tricuspid valve: TV is normal.  ------------------------------------------------------------  Doppler measurements Norma l Mitral valve Pressure 154 ms ----- half-time Area 1.4 cm^2 ----- (PHT) 3 Area 0.7 cm^2/m^2 ----- index 2 (PHT)  ------------------------------------------------------------ Prepared and Electronically Authenticated by  Dietrich Pates 2014-07-17T17:20:[id number]    Cardiac Catheterization Procedure Note  Name: DAYMOND CORDTS   MRN: [id number]   DOB: 25-Jul-1946   Procedure: Right Heart Cath, Left Heart Cath, Selective Coronary Angiography, LV angiography   Indication: Mitral regurgitation, considering valve replacement.   Procedural Details: The right groin was prepped, draped, and anesthetized with 1% lidocaine. Using the modified Seldinger technique a 4 French sheath was placed in the right femoral artery and a 7 French sheath was placed in the right femoral vein. A Swan-Ganz catheter was used for the right heart catheterization. Standard protocol was followed for recording of right heart pressures and sampling of oxygen saturations. Fick cardiac output was calculated. Standard Judkins catheters were used for selective coronary angiography and left ventriculography. There were no immediate procedural complications. The patient was transferred to the post catheterization recovery area for further monitoring.   Procedural  Findings:   Hemodynamics (mmHg)   RA mean 5   RV 42/6   PA 42/14, mean 27   PCWP  mean 12 with prominent V waves   LV 153/20   AO 149/70   Oxygen saturations:   PA 68%   AO 96%   Cardiac Output (Fick) 4.8   Cardiac Index (Fick) 2.5   PVR 3.13 WU   Coronary angiography:   Coronary dominance: right   Left mainstem: No significant disease.   Left anterior descending (LAD): The distal LAD tapers to small size but no significant obstructive disease.   Left circumflex (LCx): No significant disease.   Right coronary artery (RCA): No significant disease.   Left ventriculography: Left ventricular systolic function is normal, LVEF is estimated at 60-65% with no regional wall motion abnormalities in the RAO projection, there is 3+ mitral regurgitation noted.   Final Conclusions: Mild pulmonary hypertension with prominent V-waves in the PCWP pressure tracing. 3+ MR by LV-gram (very eccentric MR in multiple jets on TEE), no obstructive coronary disease.   Recommendations: Given significant dyspnea and severe MR by echo, I will refer patient to CVTS for evaluation for redo MVR. Resume coumadin tonight.   Marca Ancona   02/19/2013, 1:31 PM  CT ANGIOGRAPHY CHEST, ABDOMEN AND PELVIS  Technique: Multidetector CT imaging through the chest, abdomen and   pelvis was performed using the standard protocol during bolus   administration of intravenous contrast. Multiplanar reconstructed   images including MIPs were obtained and reviewed to evaluate the   vascular anatomy.   Contrast: 60mL OMNIPAQUE IOHEXOL 350 MG/ML SOLN   Comparison: None.   CTA CHEST   Findings: The chest wall is unremarkable and stable. No   supraclavicular or axillary mass or adenopathy. The thyroid gland   is normal. The bony thorax is intact. No destructive bone lesions   or spinal canal compromise. There are multiple thoracic and lumbar   compression fractures.   The heart is mildly enlarged but stable. No pericardial effusion.    Prosthetic mitral valve is noted. The ascending aorta is normal.   No dissection or focal aneurysm. The branch vessels are patent.   Mild tortuosity and atherosclerotic calcifications. The descending   aorta is normal in caliber. Mild tortuosity but no dissection.   Small scattered mediastinal and hilar lymph nodes but no   adenopathy. The esophagus is grossly normal.   Examination of the lung parenchyma demonstrates advanced   emphysematous changes and areas of pulmonary scarring. A marked   loss of volume in the left lung likely due to previous surgery with   surgical changes involving the left ribs. No pleural effusion,   pulmonary edema or mass. Left basilar atelectasis and scarring   changes noted.   Review of the MIP images confirms the above findings.   IMPRESSION:   1. No CT findings for aortic dissection or aneurysm.   2. Mild scattered atherosclerotic calcifications involving the   aorta and branch vessels.   3. Advanced emphysematous changes and pulmonary scarring with   postsurgical volume loss involving the left lung.   4. Prosthetic mitral valve appears to be well situated without   complicating features.   CTA ABDOMEN AND PELVIS   Findings: The abdominal aorta demonstrates moderate   atherosclerotic calcifications. There are calcifications at the   renal artery ostia and probable mild left proximal renal artery   stenosis. No dissection or aneurysm. Advanced atherosclerotic   calcifications involving the proximal iliac arteries but no   aneurysm. The common femoral arteries appear normal.   The SMA and IMA are patent. The celiac axis is normal.  The solid abdominal organs are unremarkable. No mass lesions or   inflammatory changes. There are bilateral benign appearing renal   cysts.   The stomach, duodenum, small bowel and colon grossly normal without   oral contrast. A small duodenal diverticulum is noted. No   inflammatory changes or mass lesions. The appendix is  normal. No   mesenteric or retroperitoneal mass or adenopathy. Moderate   diverticulosis of the sigmoid colon.   The bladder, prostate gland and seminal vesicles are unremarkable.   No pelvic mass, adenopathy or free pelvic fluid collections. No   inguinal mass or adenopathy.   The bony structures are intact. Remote L1 and L2 lumbar   compression fractures are noted.   Review of the MIP images confirms the above findings.   IMPRESSION:   1. Moderate atherosclerotic calcifications involving the aorta and   branch vessels but no focal abdominal aortic aneurysm or   dissection.   2. Advanced atherosclerotic calcifications involving the proximal   iliac arteries.   3. No acute abdominal/pelvic findings, mass lesions or   lymphadenopathy.   Original Report Authenticated By: Rudie Meyer, M.D.   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 He presents with progressive symptoms of exertional shortness of breath. Left ventricular systolic function is mild to moderately reduced but pulmonary artery pressures are only mild to moderately elevated. The patient does not have significant coronary artery disease, risks associated with surgery will be somewhat elevated due to the patient's other comorbid medical conditions including chronic obstructive pulmonary disease, atrial  fibrillation and mildly limited physical mobility. The patient might benefit from concomitant Maze procedure if technically feasible at the time of surgery, although he appears to have a fairly long duration of chronic persistent atrial fibrillation dating back several years at least.  The patient does have COPD although he quit smoking in 2006.  CT angiogram of the  chest abdomen and pelvis demonstrates the patient has suitable vascular access for minimally invasive approach for surgery including femoral artery cannulation.    Plan:  The patient and his sister were counseled at length regarding the indications, risks, and potential benefits of redo mitral valve replacement in the office today.  Alternative surgical approaches have been discussed, including a comparison between conventional sternotomy and minimally-invasive techniques.  The relative risks and benefits of each have been reviewed as they pertain to the patient's specific circumstances, and all of their questions have been addressed. The rationale for elective surgery has been explained, including a comparison between surgery and continued medical therapy with close follow-up.  We discussed the possibility of replacing the mitral valve using a mechanical prosthesis with the attendant need for long-term anticoagulation versus the alternative of replacing it using a bioprosthetic tissue valve with its potential for late structural valve deterioration and failure, depending upon the patient's longevity.  The patient specifically requests that if the mitral valve must be replaced that it be done using a bioprosthetic tissue valve.  All questions have been addressed.    We plan to proceed with redo mitral valve replacement using a bioprosthetic tissue valve and possible maze procedure via minimally invasive right mini thoracotomy approach on Tuesday, 03/24/2013. The patient has been instructed to begin taking amiodarone tomorrow and stop taking  Coumadin after his dose on Wednesday, 03/18/2013.  The patient understands and accepts all potential associated risks of surgery including but not limited to risk of death, stroke, myocardial infarction, congestive heart failure, respiratory failure, renal failure, bleeding requiring blood transfusion and/or reexploration, arrhythmia, heart block or bradycardia requiring permanent pacemaker, pneumonia, pleural effusion, wound infection, pulmonary embolus or other thromboembolic complication, chronic pain or other delayed complications.  All questions answered.     Salvatore Decent. Cornelius Moras, MD

## 2013-03-23 NOTE — Progress Notes (Signed)
Anesthesia Chart Review:  Patient is a 67 year old male scheduled for minimally invasive redo mitral valve replacement and Maze procedure.  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 PCP is Dr. Andi Devon.  Cardiologist is Dr. Marca Ancona.  EKG on 81/9/14 showed afib versus flutter, ventricular rate 64 bpm, borderline rightward axis.  Cardiac cath on 02/19/13 showed: Mild pulmonary hypertension with prominent V-waves in the PCWP pressure tracing. 3+ MR by LV-gram (very eccentric MR in multiple jets on TEE), no obstructive coronary disease. EF 60-65%.  TEE on 02/12/13 showed, normal LVEF. Mitral valve: MV prosthesis is mildly thickened. Peak and mean gradients through the valve are 22 and 5 mm Hg respectively MVA by P T1/2 is 1.4 cm2. There are 3 prominent jets of MR One is central, one more anterior. One is eccentric and courses towards back of LA Overall MR is 3+ -4/4 with some late systolic backflow into the pulmonary veins. Left atrium: No evidence of thrombus in the atrial cavity or appendage.  Carotid duplex on 03/13/13 showed 1-39% bilateral ICA stenosis, antegrade vertebral flow.  PFTs on 03/13/13 showed FVE 2.85 (76%), FEV1 1.78 (64%).  CTA of the chest and abdomen noted from 03/13/13.  CXR report on 03/20/13 showed: Status post median sternotomy and mitral valve replacement. Stable postsurgical changes and volume loss left hemithorax. Chronic pleural thickening and blunting of the left costophrenic angle again noted. Right lung is clear. No acute infiltrate or pulmonary edema. Old left rib fractures are stable. Again noted multiple compression fractures of thoracic spine  without significant change.  Preoperative labs noted.  Repeat PT/INR on the day of surgery.  Velna Ochs [facility] Short Stay Center/Anesthesiology Phone [phone] 03/23/2013 10:27 AM

## 2013-03-24 ENCOUNTER — Inpatient Hospital Stay (HOSPITAL_COMMUNITY): Payer: Medicare PPO

## 2013-03-24 ENCOUNTER — Encounter (HOSPITAL_COMMUNITY): Payer: Self-pay | Admitting: *Deleted

## 2013-03-24 ENCOUNTER — Encounter (HOSPITAL_COMMUNITY): Payer: Self-pay | Admitting: Vascular Surgery

## 2013-03-24 ENCOUNTER — Encounter (HOSPITAL_COMMUNITY)
Admission: RE | Disposition: A | Discharge status: Home / Self Care | Payer: Self-pay | Source: Ambulatory Visit | Attending: Thoracic Surgery (Cardiothoracic Vascular Surgery)

## 2013-03-24 ENCOUNTER — Inpatient Hospital Stay (HOSPITAL_COMMUNITY): Payer: Medicare PPO | Admitting: *Deleted

## 2013-03-24 ENCOUNTER — Inpatient Hospital Stay (HOSPITAL_COMMUNITY)
Admission: RE | Admit: 2013-03-24 | Discharge: 2013-03-31 | DRG: 220 | Disposition: A | Discharge status: Home / Self Care | Payer: Medicare PPO | Source: Ambulatory Visit | Attending: Thoracic Surgery (Cardiothoracic Vascular Surgery) | Admitting: Thoracic Surgery (Cardiothoracic Vascular Surgery)

## 2013-03-24 DIAGNOSIS — K219 Gastro-esophageal reflux disease without esophagitis: Secondary | ICD-10-CM | POA: Diagnosis present

## 2013-03-24 DIAGNOSIS — D509 Iron deficiency anemia, unspecified: Secondary | ICD-10-CM | POA: Diagnosis present

## 2013-03-24 DIAGNOSIS — Z9181 History of falling: Secondary | ICD-10-CM

## 2013-03-24 DIAGNOSIS — I700 Atherosclerosis of aorta: Secondary | ICD-10-CM | POA: Diagnosis present

## 2013-03-24 DIAGNOSIS — Z953 Presence of xenogenic heart valve: Secondary | ICD-10-CM

## 2013-03-24 DIAGNOSIS — Y9210 Unspecified residential institution as the place of occurrence of the external cause: Secondary | ICD-10-CM | POA: Diagnosis not present

## 2013-03-24 DIAGNOSIS — G40909 Epilepsy, unspecified, not intractable, without status epilepticus: Secondary | ICD-10-CM | POA: Diagnosis present

## 2013-03-24 DIAGNOSIS — E876 Hypokalemia: Secondary | ICD-10-CM | POA: Diagnosis not present

## 2013-03-24 DIAGNOSIS — F1010 Alcohol abuse, uncomplicated: Secondary | ICD-10-CM | POA: Diagnosis present

## 2013-03-24 DIAGNOSIS — I059 Rheumatic mitral valve disease, unspecified: Secondary | ICD-10-CM

## 2013-03-24 DIAGNOSIS — D6959 Other secondary thrombocytopenia: Secondary | ICD-10-CM | POA: Diagnosis not present

## 2013-03-24 DIAGNOSIS — J449 Chronic obstructive pulmonary disease, unspecified: Secondary | ICD-10-CM | POA: Diagnosis present

## 2013-03-24 DIAGNOSIS — Z7901 Long term (current) use of anticoagulants: Secondary | ICD-10-CM

## 2013-03-24 DIAGNOSIS — J4489 Other specified chronic obstructive pulmonary disease: Secondary | ICD-10-CM | POA: Diagnosis present

## 2013-03-24 DIAGNOSIS — I519 Heart disease, unspecified: Secondary | ICD-10-CM | POA: Diagnosis not present

## 2013-03-24 DIAGNOSIS — I428 Other cardiomyopathies: Secondary | ICD-10-CM | POA: Diagnosis present

## 2013-03-24 DIAGNOSIS — D62 Acute posthemorrhagic anemia: Secondary | ICD-10-CM | POA: Diagnosis not present

## 2013-03-24 DIAGNOSIS — I050 Rheumatic mitral stenosis: Secondary | ICD-10-CM | POA: Diagnosis present

## 2013-03-24 DIAGNOSIS — I10 Essential (primary) hypertension: Secondary | ICD-10-CM | POA: Diagnosis present

## 2013-03-24 DIAGNOSIS — E8779 Other fluid overload: Secondary | ICD-10-CM | POA: Diagnosis not present

## 2013-03-24 DIAGNOSIS — I2789 Other specified pulmonary heart diseases: Secondary | ICD-10-CM | POA: Diagnosis present

## 2013-03-24 DIAGNOSIS — T82897A Other specified complication of cardiac prosthetic devices, implants and grafts, initial encounter: Principal | ICD-10-CM | POA: Diagnosis present

## 2013-03-24 DIAGNOSIS — T502X5A Adverse effect of carbonic-anhydrase inhibitors, benzothiadiazides and other diuretics, initial encounter: Secondary | ICD-10-CM | POA: Diagnosis not present

## 2013-03-24 DIAGNOSIS — Z952 Presence of prosthetic heart valve: Secondary | ICD-10-CM

## 2013-03-24 DIAGNOSIS — Y834 Other reconstructive surgery as the cause of abnormal reaction of the patient, or of later complication, without mention of misadventure at the time of the procedure: Secondary | ICD-10-CM | POA: Diagnosis present

## 2013-03-24 DIAGNOSIS — I4891 Unspecified atrial fibrillation: Secondary | ICD-10-CM

## 2013-03-24 DIAGNOSIS — Z87891 Personal history of nicotine dependence: Secondary | ICD-10-CM

## 2013-03-24 DIAGNOSIS — R4587 Impulsiveness: Secondary | ICD-10-CM | POA: Diagnosis not present

## 2013-03-24 DIAGNOSIS — Y831 Surgical operation with implant of artificial internal device as the cause of abnormal reaction of the patient, or of later complication, without mention of misadventure at the time of the procedure: Secondary | ICD-10-CM | POA: Diagnosis not present

## 2013-03-24 HISTORY — PX: MITRAL VALVE REPLACEMENT: SHX147

## 2013-03-24 HISTORY — DX: Presence of xenogenic heart valve: Z95.3

## 2013-03-24 HISTORY — PX: INTRAOPERATIVE TRANSESOPHAGEAL ECHOCARDIOGRAM: SHX5062

## 2013-03-24 HISTORY — PX: MINIMALLY INVASIVE MAZE PROCEDURE: SHX6244

## 2013-03-24 LAB — PROTIME-INR
INR: 1.11 (ref 0.00–1.49)
INR: 1.42 (ref 0.00–1.49)
Prothrombin Time: 14.1 seconds (ref 11.6–15.2)
Prothrombin Time: 17 seconds — ABNORMAL HIGH (ref 11.6–15.2)

## 2013-03-24 LAB — POCT I-STAT 3, ART BLOOD GAS (G3+)
Acid-base deficit: 2 mmol/L (ref 0.0–2.0)
O2 Saturation: 96 %
Patient temperature: 34.8

## 2013-03-24 LAB — CBC
HCT: 24.9 % — ABNORMAL LOW (ref 39.0–52.0)
Hemoglobin: 8.6 g/dL — ABNORMAL LOW (ref 13.0–17.0)
MCH: 32.3 pg (ref 26.0–34.0)
MCHC: 34.4 g/dL (ref 30.0–36.0)
MCV: 93.6 fL (ref 78.0–100.0)
Platelets: 126 10*3/uL — ABNORMAL LOW (ref 150–400)
Platelets: 134 10*3/uL — ABNORMAL LOW (ref 150–400)
RBC: 2.63 MIL/uL — ABNORMAL LOW (ref 4.22–5.81)
RBC: 2.66 MIL/uL — ABNORMAL LOW (ref 4.22–5.81)
WBC: 14.5 10*3/uL — ABNORMAL HIGH (ref 4.0–10.5)

## 2013-03-24 LAB — CREATININE, SERUM
GFR calc Af Amer: 71 mL/min — ABNORMAL LOW (ref >90)
GFR calc non Af Amer: 61 mL/min — ABNORMAL LOW (ref >90)

## 2013-03-24 SURGERY — REPLACEMENT, MITRAL VALVE, MINIMALLY INVASIVE
Anesthesia: General | Site: Chest | Laterality: Right | Wound class: Clean

## 2013-03-24 MED ORDER — BISACODYL 5 MG PO TBEC
10.0000 mg | DELAYED_RELEASE_TABLET | Freq: Every day | ORAL | Status: DC
  Administered 2013-03-25 – 2013-03-30 (×6): 10 mg via ORAL
  Filled 2013-03-24 (×6): qty 2

## 2013-03-24 MED ORDER — PHENYLEPHRINE HCL 10 MG/ML IJ SOLN
0.0000 ug/min | INTRAVENOUS | Status: DC
  Filled 2013-03-24 (×2): qty 2

## 2013-03-24 MED ORDER — DEXTROSE 5 % IV SOLN
750.0000 mg | INTRAVENOUS | Status: DC
  Filled 2013-03-24 (×2): qty 750

## 2013-03-24 MED ORDER — ACETAMINOPHEN 500 MG PO TABS
1000.0000 mg | ORAL_TABLET | Freq: Four times a day (QID) | ORAL | Status: AC
  Administered 2013-03-25 – 2013-03-29 (×15): 1000 mg via ORAL
  Filled 2013-03-24 (×19): qty 2

## 2013-03-24 MED ORDER — SODIUM BICARBONATE 4.2 % IV SOLN
INTRAVENOUS | Status: DC | PRN
  Administered 2013-03-24: 50 meq via INTRAVENOUS

## 2013-03-24 MED ORDER — MIDAZOLAM HCL 5 MG/5ML IJ SOLN
INTRAMUSCULAR | Status: DC | PRN
  Administered 2013-03-24 (×2): 1 mg via INTRAVENOUS
  Administered 2013-03-24: 5 mg via INTRAVENOUS
  Administered 2013-03-24: 3 mg via INTRAVENOUS

## 2013-03-24 MED ORDER — MILRINONE IN DEXTROSE 20 MG/100ML IV SOLN
0.0000 ug/kg/min | INTRAVENOUS | Status: DC
  Filled 2013-03-24: qty 100

## 2013-03-24 MED ORDER — FAMOTIDINE IN NACL 20-0.9 MG/50ML-% IV SOLN
20.0000 mg | Freq: Two times a day (BID) | INTRAVENOUS | Status: AC
  Administered 2013-03-24 – 2013-03-25 (×2): 20 mg via INTRAVENOUS
  Filled 2013-03-24: qty 50

## 2013-03-24 MED ORDER — DEXTROSE 5 % IV SOLN
0.7500 g | INTRAVENOUS | Status: DC | PRN
  Administered 2013-03-24: .75 g via INTRAVENOUS

## 2013-03-24 MED ORDER — SODIUM CHLORIDE 0.9 % IV SOLN: 250.0000 mL | INTRAVENOUS | Status: DC

## 2013-03-24 MED ORDER — MORPHINE SULFATE 2 MG/ML IJ SOLN: 1.0000 mg | INTRAMUSCULAR | Status: AC | PRN

## 2013-03-24 MED ORDER — VECURONIUM BROMIDE 10 MG IV SOLR
INTRAVENOUS | Status: DC | PRN
  Administered 2013-03-24 (×3): 10 mg via INTRAVENOUS

## 2013-03-24 MED ORDER — ACETAMINOPHEN 160 MG/5ML PO SOLN
1000.0000 mg | Freq: Four times a day (QID) | ORAL | Status: DC
  Administered 2013-03-25: 1000 mg
  Filled 2013-03-24: qty 20.3

## 2013-03-24 MED ORDER — PLASMA-LYTE 148 IV SOLN
INTRAVENOUS | Status: DC
  Filled 2013-03-24: qty 2.5

## 2013-03-24 MED ORDER — EPHEDRINE SULFATE 50 MG/ML IJ SOLN
INTRAMUSCULAR | Status: DC | PRN
  Administered 2013-03-24 (×2): 5 mg via INTRAVENOUS

## 2013-03-24 MED ORDER — MILRINONE IN DEXTROSE 20 MG/100ML IV SOLN
0.1250 ug/kg/min | INTRAVENOUS | Status: DC
  Filled 2013-03-24: qty 100

## 2013-03-24 MED ORDER — POTASSIUM CHLORIDE 10 MEQ/50ML IV SOLN: 10.0000 meq | INTRAVENOUS | Status: AC

## 2013-03-24 MED ORDER — CHLORHEXIDINE GLUCONATE 4 % EX LIQD: 30.0000 mL | CUTANEOUS | Status: DC

## 2013-03-24 MED ORDER — DOCUSATE SODIUM 100 MG PO CAPS
200.0000 mg | ORAL_CAPSULE | Freq: Every day | ORAL | Status: DC
  Administered 2013-03-25 – 2013-03-30 (×6): 200 mg via ORAL
  Filled 2013-03-24 (×7): qty 2

## 2013-03-24 MED ORDER — METOPROLOL TARTRATE 1 MG/ML IV SOLN
2.5000 mg | INTRAVENOUS | Status: DC | PRN
  Filled 2013-03-24: qty 5

## 2013-03-24 MED ORDER — SODIUM CHLORIDE 0.9 % IJ SOLN: 3.0000 mL | INTRAMUSCULAR | Status: DC | PRN

## 2013-03-24 MED ORDER — METOPROLOL TARTRATE 12.5 MG HALF TABLET: 12.5000 mg | ORAL_TABLET | Freq: Once | ORAL | Status: DC

## 2013-03-24 MED ORDER — OXYCODONE HCL 5 MG PO TABS
5.0000 mg | ORAL_TABLET | ORAL | Status: DC | PRN
  Administered 2013-03-25: 10 mg via ORAL
  Administered 2013-03-25 – 2013-03-26 (×2): 5 mg via ORAL
  Administered 2013-03-26 – 2013-03-31 (×12): 10 mg via ORAL
  Filled 2013-03-24 (×10): qty 2
  Filled 2013-03-24: qty 1
  Filled 2013-03-24: qty 2
  Filled 2013-03-24: qty 1
  Filled 2013-03-24 (×2): qty 2

## 2013-03-24 MED ORDER — LACTATED RINGERS IV SOLN
INTRAVENOUS | Status: DC | PRN
  Administered 2013-03-24 (×3): via INTRAVENOUS

## 2013-03-24 MED ORDER — CALCIUM CHLORIDE 10 % IV SOLN
1.0000 g | Freq: Once | INTRAVENOUS | Status: AC | PRN
  Filled 2013-03-24: qty 10

## 2013-03-24 MED ORDER — NITROGLYCERIN IN D5W 200-5 MCG/ML-% IV SOLN: 0.0000 ug/min | INTRAVENOUS | Status: DC

## 2013-03-24 MED ORDER — VANCOMYCIN HCL IN DEXTROSE 1-5 GM/200ML-% IV SOLN
1000.0000 mg | Freq: Once | INTRAVENOUS | Status: AC
Start: 2013-03-24 — End: 2013-03-24
  Administered 2013-03-24: 1000 mg via INTRAVENOUS
  Filled 2013-03-24: qty 200

## 2013-03-24 MED ORDER — SODIUM CHLORIDE 0.9 % IV SOLN
0.5000 g/h | INTRAVENOUS | Status: DC
  Filled 2013-03-24: qty 20

## 2013-03-24 MED ORDER — ACETAMINOPHEN 160 MG/5ML PO SOLN: 650.0000 mg | Freq: Once | ORAL | Status: AC

## 2013-03-24 MED ORDER — CEFUROXIME SODIUM 1.5 G IJ SOLR
1.5000 g | Freq: Two times a day (BID) | INTRAMUSCULAR | Status: AC
  Administered 2013-03-24 – 2013-03-26 (×4): 1.5 g via INTRAVENOUS
  Filled 2013-03-24 (×4): qty 1.5

## 2013-03-24 MED ORDER — BISACODYL 10 MG RE SUPP: 10.0000 mg | Freq: Every day | RECTAL | Status: DC

## 2013-03-24 MED ORDER — MAGNESIUM SULFATE 40 MG/ML IJ SOLN
4.0000 g | Freq: Once | INTRAMUSCULAR | Status: AC
Start: 2013-03-24 — End: 2013-03-24
  Administered 2013-03-24: 4 g via INTRAVENOUS
  Filled 2013-03-24: qty 100

## 2013-03-24 MED ORDER — LACTATED RINGERS IV SOLN
INTRAVENOUS | Status: DC | PRN
  Administered 2013-03-24 (×2): via INTRAVENOUS

## 2013-03-24 MED ORDER — DEXMEDETOMIDINE HCL IN NACL 200 MCG/50ML IV SOLN: 0.1000 ug/kg/h | INTRAVENOUS | Status: DC

## 2013-03-24 MED ORDER — ACETAMINOPHEN 650 MG RE SUPP
650.0000 mg | Freq: Once | RECTAL | Status: AC
  Administered 2013-03-24: 650 mg via RECTAL

## 2013-03-24 MED ORDER — PROPOFOL 10 MG/ML IV BOLUS
INTRAVENOUS | Status: DC | PRN
  Administered 2013-03-24: 40 mg via INTRAVENOUS

## 2013-03-24 MED ORDER — SODIUM CHLORIDE 0.9 % IV SOLN
INTRAVENOUS | Status: AC
  Administered 2013-03-24: 13:00:00 via INTRAVENOUS
  Administered 2013-03-24: 69.8 mL/h via INTRAVENOUS
  Filled 2013-03-24 (×2): qty 40

## 2013-03-24 MED ORDER — SODIUM CHLORIDE 0.9 % IV SOLN: INTRAVENOUS | Status: DC

## 2013-03-24 MED ORDER — LACTATED RINGERS IV SOLN: INTRAVENOUS | Status: DC

## 2013-03-24 MED ORDER — PANTOPRAZOLE SODIUM 40 MG PO TBEC
40.0000 mg | DELAYED_RELEASE_TABLET | Freq: Every day | ORAL | Status: DC
  Administered 2013-03-26 – 2013-03-30 (×5): 40 mg via ORAL
  Filled 2013-03-24 (×5): qty 1

## 2013-03-24 MED ORDER — MIDAZOLAM HCL 2 MG/2ML IJ SOLN: 2.0000 mg | INTRAMUSCULAR | Status: DC | PRN

## 2013-03-24 MED ORDER — ASPIRIN EC 325 MG PO TBEC
325.0000 mg | DELAYED_RELEASE_TABLET | Freq: Every day | ORAL | Status: DC
  Administered 2013-03-25 – 2013-03-30 (×6): 325 mg via ORAL
  Filled 2013-03-24 (×7): qty 1

## 2013-03-24 MED ORDER — METOPROLOL TARTRATE 25 MG/10 ML ORAL SUSPENSION
12.5000 mg | Freq: Two times a day (BID) | ORAL | Status: DC
  Administered 2013-03-24: 12.5 mg
  Filled 2013-03-24 (×3): qty 5

## 2013-03-24 MED ORDER — VANCOMYCIN HCL 1000 MG IV SOLR
INTRAVENOUS | Status: DC
  Filled 2013-03-24: qty 1000

## 2013-03-24 MED ORDER — VANCOMYCIN HCL 1000 MG IV SOLR
INTRAVENOUS | Status: DC | PRN
  Administered 2013-03-24: 11:00:00

## 2013-03-24 MED ORDER — ONDANSETRON HCL 4 MG/2ML IJ SOLN: 4.0000 mg | Freq: Four times a day (QID) | INTRAMUSCULAR | Status: DC | PRN

## 2013-03-24 MED ORDER — 0.9 % SODIUM CHLORIDE (POUR BTL) OPTIME
TOPICAL | Status: DC | PRN
  Administered 2013-03-24: 5000 mL

## 2013-03-24 MED ORDER — ARTIFICIAL TEARS OP OINT
TOPICAL_OINTMENT | OPHTHALMIC | Status: DC | PRN
  Administered 2013-03-24: 1 via OPHTHALMIC

## 2013-03-24 MED ORDER — INSULIN REGULAR BOLUS VIA INFUSION
0.0000 [IU] | Freq: Three times a day (TID) | INTRAVENOUS | Status: DC
  Filled 2013-03-24: qty 10

## 2013-03-24 MED ORDER — DEXMEDETOMIDINE HCL IN NACL 400 MCG/100ML IV SOLN
0.4000 ug/kg/h | INTRAVENOUS | Status: DC
  Filled 2013-03-24: qty 100

## 2013-03-24 MED ORDER — METOPROLOL TARTRATE 12.5 MG HALF TABLET
12.5000 mg | ORAL_TABLET | Freq: Two times a day (BID) | ORAL | Status: DC
  Filled 2013-03-24 (×3): qty 1

## 2013-03-24 MED ORDER — MORPHINE SULFATE 2 MG/ML IJ SOLN
2.0000 mg | INTRAMUSCULAR | Status: DC | PRN
  Administered 2013-03-25 (×2): 2 mg via INTRAVENOUS
  Filled 2013-03-24 (×2): qty 1
  Filled 2013-03-24: qty 2

## 2013-03-24 MED ORDER — HEPARIN SODIUM (PORCINE) 1000 UNIT/ML IJ SOLN
INTRAMUSCULAR | Status: DC | PRN
  Administered 2013-03-24: 20000 [IU] via INTRAVENOUS

## 2013-03-24 MED ORDER — SODIUM CHLORIDE 0.45 % IV SOLN
INTRAVENOUS | Status: DC
  Administered 2013-03-24: 17:00:00 via INTRAVENOUS

## 2013-03-24 MED ORDER — ASPIRIN 81 MG PO CHEW: 324.0000 mg | CHEWABLE_TABLET | Freq: Every day | ORAL | Status: DC

## 2013-03-24 MED ORDER — SODIUM CHLORIDE 0.9 % IV SOLN
INTRAVENOUS | Status: DC
  Filled 2013-03-24 (×2): qty 1

## 2013-03-24 MED ORDER — MILRINONE IN DEXTROSE 20 MG/100ML IV SOLN
INTRAVENOUS | Status: DC | PRN
  Administered 2013-03-24: .3 ug/kg/min via INTRAVENOUS

## 2013-03-24 MED ORDER — FENTANYL CITRATE 0.05 MG/ML IJ SOLN
INTRAMUSCULAR | Status: DC | PRN
  Administered 2013-03-24 (×2): 250 ug via INTRAVENOUS
  Administered 2013-03-24 (×3): 50 ug via INTRAVENOUS
  Administered 2013-03-24: 1000 ug via INTRAVENOUS
  Administered 2013-03-24: 100 ug via INTRAVENOUS

## 2013-03-24 MED ORDER — LACTATED RINGERS IV SOLN
INTRAVENOUS | Status: DC | PRN
  Administered 2013-03-24 (×2): via INTRAVENOUS

## 2013-03-24 MED ORDER — DOPAMINE-DEXTROSE 3.2-5 MG/ML-% IV SOLN: 0.0000 ug/kg/min | INTRAVENOUS | Status: DC

## 2013-03-24 MED ORDER — SODIUM CHLORIDE 0.9 % IJ SOLN
3.0000 mL | Freq: Two times a day (BID) | INTRAMUSCULAR | Status: DC
  Administered 2013-03-25 – 2013-03-27 (×4): 3 mL via INTRAVENOUS
  Administered 2013-03-27: 17:00:00 via INTRAVENOUS
  Administered 2013-03-28 – 2013-03-29 (×3): 3 mL via INTRAVENOUS

## 2013-03-24 MED ORDER — ROCURONIUM BROMIDE 100 MG/10ML IV SOLN
INTRAVENOUS | Status: DC | PRN
  Administered 2013-03-24: 50 mg via INTRAVENOUS

## 2013-03-24 MED ORDER — ALBUMIN HUMAN 5 % IV SOLN
250.0000 mL | INTRAVENOUS | Status: AC | PRN
  Administered 2013-03-24 (×2): 250 mL via INTRAVENOUS

## 2013-03-24 MED ORDER — PROTAMINE SULFATE 10 MG/ML IV SOLN
INTRAVENOUS | Status: DC | PRN
  Administered 2013-03-24: 200 mg via INTRAVENOUS

## 2013-03-24 SURGICAL SUPPLY — 108 items
ADAPTER CARDIO PERF ANTE/RETRO (ADAPTER) ×6 IMPLANT
ATTRACTOMAT 16X20 MAGNETIC DRP (DRAPES) ×6 IMPLANT
BAG DECANTER FOR FLEXI CONT (MISCELLANEOUS) ×6 IMPLANT
BENZOIN TINCTURE PRP APPL 2/3 (GAUZE/BANDAGES/DRESSINGS) ×6 IMPLANT
BLADE SURG 11 STRL SS (BLADE) ×18 IMPLANT
BLADE SURG 15 STRL LF DISP TIS (BLADE) ×8 IMPLANT
BLADE SURG 15 STRL SS (BLADE) ×4
CANISTER SUCTION 2500CC (MISCELLANEOUS) ×12 IMPLANT
CANNULA FEM VENOUS REMOTE 22FR (CANNULA) IMPLANT
CANNULA FEMORAL ART 14 SM (MISCELLANEOUS) ×6 IMPLANT
CANNULA GUNDRY RCSP 15FR (MISCELLANEOUS) ×6 IMPLANT
CANNULA OPTISITE PERFUSION 16F (CANNULA) IMPLANT
CANNULA OPTISITE PERFUSION 18F (CANNULA) IMPLANT
CARDIOBLATE CARDIAC ABLATION (MISCELLANEOUS)
CLOTH BEACON ORANGE TIMEOUT ST (SAFETY) ×6 IMPLANT
CONN ST 1/4X3/8  BEN (MISCELLANEOUS) ×4
CONN ST 1/4X3/8 BEN (MISCELLANEOUS) ×8 IMPLANT
CONNECTOR 1/2X3/8X1/2 3 WAY (MISCELLANEOUS) ×1
CONNECTOR 1/2X3/8X1/2 3WAY (MISCELLANEOUS) ×2 IMPLANT
CONT SPEC 4OZ CLIKSEAL STRL BL (MISCELLANEOUS) ×3 IMPLANT
CONT SPEC STER OR (MISCELLANEOUS) ×6 IMPLANT
COVER MAYO STAND STRL (DRAPES) ×6 IMPLANT
COVER SURGICAL LIGHT HANDLE (MISCELLANEOUS) ×6 IMPLANT
CRADLE DONUT ADULT HEAD (MISCELLANEOUS) ×6 IMPLANT
DERMABOND ADVANCED (GAUZE/BANDAGES/DRESSINGS) ×4
DERMABOND ADVANCED .7 DNX12 (GAUZE/BANDAGES/DRESSINGS) ×8 IMPLANT
DEVICE CARDIOBLATE CARDIAC ABL (MISCELLANEOUS) IMPLANT
DEVICE TROCAR PUNCTURE CLOSURE (ENDOMECHANICALS) ×6 IMPLANT
DRAIN CHANNEL 28F RND 3/8 FF (WOUND CARE) ×12 IMPLANT
DRAPE BILATERAL SPLIT (DRAPES) ×6 IMPLANT
DRAPE C-ARM 42X72 X-RAY (DRAPES) ×6 IMPLANT
DRAPE CV SPLIT W-CLR ANES SCRN (DRAPES) ×6 IMPLANT
DRAPE INCISE IOBAN 66X45 STRL (DRAPES) ×12 IMPLANT
DRAPE SLUSH/WARMER DISC (DRAPES) ×6 IMPLANT
DRSG COVADERM 4X6 (GAUZE/BANDAGES/DRESSINGS) ×3 IMPLANT
DRSG COVADERM 4X8 (GAUZE/BANDAGES/DRESSINGS) ×6 IMPLANT
ELECT BLADE 6.5 EXT (BLADE) ×6 IMPLANT
ELECT REM PT RETURN 9FT ADLT (ELECTROSURGICAL) ×12
ELECTRODE REM PT RTRN 9FT ADLT (ELECTROSURGICAL) ×8 IMPLANT
FEMORAL VENOUS CANN RAP (CANNULA) ×3 IMPLANT
GLOVE BIO SURGEON STRL SZ7.5 (GLOVE) ×6 IMPLANT
GLOVE BIOGEL M 6.5 STRL (GLOVE) ×12 IMPLANT
GLOVE BIOGEL M STER SZ 6 (GLOVE) ×24 IMPLANT
GLOVE ORTHO TXT STRL SZ7.5 (GLOVE) ×18 IMPLANT
GOWN STRL NON-REIN LRG LVL3 (GOWN DISPOSABLE) ×36 IMPLANT
GUIDEWIRE ANG ZIPWIRE 038X150 (WIRE) ×6 IMPLANT
INSERT CONFORM CROSS CLAMP 66M (MISCELLANEOUS) ×6 IMPLANT
INSERT CONFORM CROSS CLAMP 86M (MISCELLANEOUS) ×6 IMPLANT
KIT BASIN OR (CUSTOM PROCEDURE TRAY) ×6 IMPLANT
KIT DILATOR VASC 18G NDL (KITS) ×6 IMPLANT
KIT DRAINAGE VACCUM ASSIST (KITS) ×3 IMPLANT
KIT ROOM TURNOVER OR (KITS) ×6 IMPLANT
KIT SUCTION CATH 14FR (SUCTIONS) ×6 IMPLANT
LEAD PACING MYOCARDI (MISCELLANEOUS) ×6 IMPLANT
LINE VENT (MISCELLANEOUS) ×6 IMPLANT
NEEDLE AORTIC ROOT 14G 7F (CATHETERS) ×9 IMPLANT
NS IRRIG 1000ML POUR BTL (IV SOLUTION) ×30 IMPLANT
PACK OPEN HEART (CUSTOM PROCEDURE TRAY) ×6 IMPLANT
PAD ARMBOARD 7.5X6 YLW CONV (MISCELLANEOUS) ×12 IMPLANT
PAD ELECT DEFIB RADIOL ZOLL (MISCELLANEOUS) ×6 IMPLANT
PATCH CORMATRIX 4CMX7CM (Prosthesis & Implant Heart) ×3 IMPLANT
PROBE CRYO2-ABLATION MALLABLE (MISCELLANEOUS) IMPLANT
RETRACTOR PVL SOFT TISSUE LG (INSTRUMENTS) ×3 IMPLANT
RETRACTOR TRL SOFT TISSUE LG (INSTRUMENTS) IMPLANT
RETRACTOR TRM SOFT TISSUE 7.5 (INSTRUMENTS) IMPLANT
SET CANNULATION TOURNIQUET (MISCELLANEOUS) ×6 IMPLANT
SET CARDIOPLEGIA MPS 5001102 (MISCELLANEOUS) ×3 IMPLANT
SET IRRIG TUBING LAPAROSCOPIC (IRRIGATION / IRRIGATOR) ×6 IMPLANT
SOLUTION ANTI FOG 6CC (MISCELLANEOUS) ×6 IMPLANT
SPONGE GAUZE 4X4 12PLY (GAUZE/BANDAGES/DRESSINGS) ×3 IMPLANT
SUCKER WEIGHTED FLEX (MISCELLANEOUS) ×12 IMPLANT
SUT BONE WAX W31G (SUTURE) ×6 IMPLANT
SUT E-PACK MINIMALLY INVASIVE (SUTURE) ×6 IMPLANT
SUT ETHIBON 2 0 V 52N 30 (SUTURE) ×3 IMPLANT
SUT ETHIBOND (SUTURE) IMPLANT
SUT ETHIBOND 2 0 SH (SUTURE) ×9 IMPLANT
SUT ETHIBOND 2 0 V4 (SUTURE) IMPLANT
SUT ETHIBOND 2 0V4 GREEN (SUTURE) IMPLANT
SUT ETHIBOND 2-0 RB-1 WHT (SUTURE) IMPLANT
SUT ETHIBOND 4 0 TF (SUTURE) IMPLANT
SUT ETHIBOND 5 0 C 1 30 (SUTURE) IMPLANT
SUT ETHIBOND NAB MH 2-0 36IN (SUTURE) ×3 IMPLANT
SUT ETHIBOND X763 2 0 SH 1 (SUTURE) ×6 IMPLANT
SUT GORETEX 6.0 TH-9 30 IN (SUTURE) IMPLANT
SUT GORETEX CV 4 TH 22 36 (SUTURE) ×9 IMPLANT
SUT GORETEX CV-5THC-13 36IN (SUTURE) IMPLANT
SUT GORETEX CV4 TH-18 (SUTURE) ×18 IMPLANT
SUT GORETEX TH-18 36 INCH (SUTURE) IMPLANT
SUT PROLENE 3 0 SH DA (SUTURE) ×18 IMPLANT
SUT PROLENE 4 0 RB 1 (SUTURE) ×4
SUT PROLENE 4 0 SH DA (SUTURE) ×12 IMPLANT
SUT PROLENE 4-0 RB1 .5 CRCL 36 (SUTURE) ×8 IMPLANT
SYRINGE 10CC LL (SYRINGE) ×6 IMPLANT
SYS ATRICLIP LAA EXCLUSION 45 (CLIP) IMPLANT
SYSTEM SAHARA CHEST DRAIN ATS (WOUND CARE) ×12 IMPLANT
TAPE CLOTH SURG 4X10 WHT LF (GAUZE/BANDAGES/DRESSINGS) ×3 IMPLANT
TOWEL OR 17X24 6PK STRL BLUE (TOWEL DISPOSABLE) ×6 IMPLANT
TOWEL OR 17X26 10 PK STRL BLUE (TOWEL DISPOSABLE) ×6 IMPLANT
TRAY FOLEY IC TEMP SENS 14FR (CATHETERS) ×6 IMPLANT
TROCAR BLADELESS 5M (ENDOMECHANICALS) ×3 IMPLANT
TROCAR XCEL BLADELESS 5X75MML (TROCAR) ×6 IMPLANT
TROCAR XCEL NON-BLD 11X100MML (ENDOMECHANICALS) ×12 IMPLANT
TUBE SUCT INTRACARD DLP 20F (MISCELLANEOUS) ×6 IMPLANT
TUNNELER SHEATH ON-Q 11GX8 DSP (PAIN MANAGEMENT) IMPLANT
UNDERPAD 30X30 INCONTINENT (UNDERPADS AND DIAPERS) ×6 IMPLANT
VALVE MITRAL MAGNA 29 (Prosthesis & Implant Heart) ×3 IMPLANT
WATER STERILE IRR 1000ML POUR (IV SOLUTION) ×12 IMPLANT
WIRE BENTSON .035X145CM (WIRE) ×6 IMPLANT

## 2013-03-24 NOTE — Op Note (Signed)
CARDIOTHORACIC SURGERY OPERATIVE NOTE  Date of Procedure:  03/24/2013  Preoperative Diagnosis: Prosthetic Valve Dysfunction with Severe Mitral Regurgitation  Postoperative Diagnosis: Same  Procedure:    Minimally-Invasive Redo Mitral Valve Replacement  [facility] Mitral Bovine Bioprosthetic Tissue Valve (size 29mm, model # 7300TFX, serial # [id number])    Surgeon: Salvatore Decent. 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 Findings at the time of surgery were consistent with likely chronic mitral valve prolapse with mitral regurgitation that subsequently became complicated by acute and subacute bacterial endocarditis with group G. Streptococcus. A bioprosthetic tissue valve was chosen at the time because of the patient's ongoing troubles with alcohol abuse and history of seizure disorder. The patient recovered uneventfully. The patient has been followed by Dr. Riley Kill for several years with history of persistent atrial fibrillation on chronic Coumadin therapy. He has done reasonably well with this, although apparently he did suffer an epidural hematoma in 2012 at which time he was notably supratherapeutic on Coumadin.   Over the past 2 years the patient has developed progressive symptoms of exertional shortness of breath. He was noted to have a prominent systolic murmur  on exam by both Dr. Riley Kill and Dr. Delford Field, but transthoracic echocardiogram performed in February of this year did not demonstrate much of any mitral regurgitation. The patient was subsequently seen in followup earlier this month by Dr. Shirlee Latch who reviewed the patient's most recent transthoracic echocardiogram and noted that the presence of significant acoustic shadowing might be missing the presence of significant mitral regurgitation. Subsequently a transesophageal echocardiogram was performed 02/12/2013 by Dr. Tenny Craw. 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 Left and right heart catheterization was performed by Dr. Shirlee Latch. This confirmed the absence of significant coronary artery disease and was notable for the presence of mild pulmonary hypertension with prominent V waves on pulmonary capillary wedge tracing. The patient was referred for surgical consultation. The patient has been seen in consultation and counseled at length regarding the indications, risks and potential benefits of surgery.  All questions have been answered, and the patient provides full informed consent for the operation as described.     DETAILS OF THE OPERATIVE PROCEDURE  Preparation:  The patient is brought to the operating room on the above mentioned date and central monitoring was established by the anesthesia team including placement of Swan-Ganz catheter through the left internal jugular vein.  A radial arterial line is placed. The patient is placed in the supine position on the operating table.  Intravenous antibiotics are administered. General endotracheal anesthesia is induced uneventfully. The patient is initially intubated using a dual lumen endotracheal tube.  A Foley catheter is placed.  Baseline transesophageal echocardiogram was performed.  Findings were notable for normal LV systolic function and mild to moderate LV hypertrophy.  There was severe mitral  regurgitation with normal leaflet motion (type I dysfunction)  presumably due to leaflet perforation.  There was no aortic insufficiency.  A soft roll is placed behind the patient's left scapula and the neck gently extended and turned to the left.   The patient's right neck, chest, abdomen, both groins, and both lower extremities are prepared and draped in a sterile manner. A time out procedure is performed.  Surgical Approach:  A right miniature anterolateral thoracotomy incision is performed. The incision is placed just lateral to and superior to the right nipple. The pectoralis major muscle is retracted medially and completely preserved. The right pleural space is entered through the 3rd intercostal space. A soft tissue retractor is placed.  Two 11 mm ports are placed through separate stab incisions inferiorly. The right pleural space is insufflated continuously with carbon dioxide gas through the posterior port during the remainder of the operation.  A pledgeted sutures placed through the dome of the right hemidiaphragm and retracted inferiorly to facilitate exposure.     Extracorporeal Cardiopulmonary Bypass and Myocardial Protection:  A small incision is made in the right inguinal crease and the anterior surface of the right common femoral artery and right common femoral vein are identified.  The patient is placed in Trendelenburg's position. The right internal jugular vein is cannulated with Seldinger technique and a guidewire advanced into the right atrium. The patient is heparinized systemically. The right internal jugular vein is cannulated with a 14 [place] pediatric femoral venous cannula. Pursestring sutures are placed on the anterior surface of the right common femoral vein and right common femoral artery. The right common femoral vein is cannulated with the Seldinger technique and a guidewire is advanced under transesophageal echocardiogram guidance through the right atrium. The femoral vein  is cannulated with a long 22 French femoral venous cannula. The right common femoral artery is cannulated with Seldinger technique and a flexible guidewire is advanced until it can be appreciated intraluminally in the descending thoracic aorta on transesophageal echocardiogram. The femoral artery is cannulated with an 18 French femoral arterial cannula.  Adequate heparinization is verified.     The entire pre-bypass portion of the operation was notable for stable hemodynamics.  Cardiopulmonary bypass was begun.  Vacuum assist venous drainage is utilized.  A longitudinal incision is made in the pericardium 3 cm anterior to the phrenic nerve and silk traction sutures are placed on either side of the incision for exposure.  There were severe adhesions but the free edge of the pericardium was easily identified.  The incision in the pericardium is extended in both directions. Venous drainage and exposure are notably excellent.     The patient is cooled to 28C systemic temperature.  A bipolar epicardial pacing wire is placed on the inferior surface of the right ventricle.  The patient is maintained in Trendelenburg's position.  Rapid pacing is performed at 600 beat/minute until ventricular fibrillation begins.  The valve procedure is performed entirely under cold ventricular fibrillation arrest.   Redo Mitral Valve Replacement:  A left atriotomy incision was performed through the interatrial groove and extended partially across the back wall of the left atrium after opening the oblique sinus inferiorly.  There were dense adhesions and the left atrium was relatively small sized.  The mitral valve is exposed using a self-retaining retractor.  The mitral valve was inspected and notable for obvious degeneration of the leaflets with a large perforation in one of the leaflets.  The existing degenerated bioprosthetic tissue valve is removed using sharp dissection to remove all previous sutures and  to excise the  sewing ring along with the valve. This is somewhat tedious do to the dense nature of fibrosis but ultimately the valve is successfully remove without causing injury to the annulus itself. The remainder of the annulus is debrided in the left atrium and left ventricle irrigated with copious saline solution. The annulus was sized to accept a 29 mm stented bioprosthetic tissue valve.  Redo valve replacement is performed using interrupted 2-0 Ethibond horizontal mattress pledgeted sutures with pledgets in the supra-annular position. An Edwards magna mitral bovine bioprosthetic tissue valve (size 29mm, model # 7300TFX, serial # [id number]) is secured in place uneventfully. After completion of valve replacement the left atrium and ventricle are again irrigated with saline solution and normal valve leaflet function is verified.   Procedure Completion:  Rewarming is begun.  The atriotomy was closed using a 2-layer closure of running 3-0 Prolene suture after placing a sump drain across the mitral valve to serve as a left ventricular vent.  The heart was cardioverted and ventricular pacing is begun.  Epicardial pacing wires are fixed to the inferior wall of the right atrial appendage. The patient is rewarmed to 37C temperature. The left ventricular vent is removed.  The patient is weaned and disconnected from cardiopulmonary bypass.  The patient's rhythm at separation from bypass was AV paced.  The patient was weaned from bypass on low dose milrinone and dopamine infusions. Total cardiopulmonary bypass time for the operation was 234 minutes.  Followup transesophageal echocardiogram performed after separation from bypass revealed a well-seated bioprosthetic tissue valve in the mitral position with a normal functioning mitral valve. There was no residual leak.  Left ventricular function was unchanged from preoperatively.    On further examination of the transesophageal echocardiogram there was an unusual jet of  turbulent flow in the left atrium. This jet was well away from the valve and initially of unclear etiology. Bubble studies were performed to make sure that there was no sign of intra-atrial communication with right to left shunt. Ultimately intraoperative consultation with Dr. Gala Romney was obtained and a thorough transesophageal echocardiogram was performed. It became clear that the jet of turbulent flow seen on color Doppler was emanating from the right inferior pulmonary vein. This was confirmed when the jet was noted to disappear during single lung ventilation while the right lung was collapsed. The orifice of the right inferior pulmonary vein could be clearly identified and did not appear to be stenosed.  The femoral arterial and venous cannulae were removed uneventfully. There was a palpable pulse in the distal right common femoral artery after removal of the cannula. Protamine was administered to reverse the anticoagulation. The right internal jugular cannula was removed and manual pressure held on the neck for 15 minutes.  Single lung ventilation was begun. The atriotomy closure was inspected for hemostasis. The pericardial sac was drained using a 28 French Bard drain placed through the anterior port incision.  The pericardium was closed using a patch of core matrix bovine submucosal tissue patch. The right pleural space is irrigated with saline solution and inspected for hemostasis. The right pleural space was drained using a 28 French Bard drain placed through the posterior port incision. The miniature thoracotomy incision was closed in multiple layers in routine fashion. The right groin incision was inspected for hemostasis and closed in multiple layers in routine fashion.  The post-bypass portion of the operation was notable for stable rhythm and hemodynamics.  The patient was transfused 2 packs of adult platelets  and 2 units fresh frozen plasma do to thrombocytopenia and coagulopathy after  reversal of heparin with protamine.   Disposition:  The patient tolerated the procedure well.  The patient was reintubated using a single lumen endotracheal tube and subsequently transported to the surgical intensive care unit in stable condition. There were no intraoperative complications. All sponge instrument and needle counts are verified correct at completion of the operation.     Salvatore Decent. Cornelius Moras MD 03/24/2013 4:11 PM

## 2013-03-24 NOTE — Anesthesia Procedure Notes (Signed)
Procedure Name: Intubation Date/Time: 03/24/2013 8:00 AM Performed by: Gayla Medicus Pre-anesthesia Checklist: Patient identified, Timeout performed, Emergency Drugs available, Suction available and Patient being monitored Patient Re-evaluated:Patient Re-evaluated prior to inductionOxygen Delivery Method: Circle system utilized Preoxygenation: Pre-oxygenation with 100% oxygen Intubation Type: IV induction Ventilation: Mask ventilation without difficulty and Oral airway inserted - appropriate to patient size Laryngoscope Size: Mac and 4 Grade View: Grade I Tube type: Oral Endobronchial tube: Left, EBT position confirmed by fiberoptic bronchoscope and EBT position confirmed by auscultation and 39 Fr Number of attempts: 1 Airway Equipment and Method: Stylet and Fiberoptic brochoscope Placement Confirmation: ETT inserted through vocal cords under direct vision,  positive ETCO2 and breath sounds checked- equal and bilateral Tube secured with: Tape Dental Injury: Teeth and Oropharynx as per pre-operative assessment

## 2013-03-24 NOTE — Progress Notes (Signed)
  Echocardiogram Echocardiogram Transesophageal has been performed.  Jeffery Copeland 03/24/2013, 8:44 AM

## 2013-03-24 NOTE — Transfer of Care (Signed)
Immediate Anesthesia Transfer of Care Note  Patient: Jeffery Copeland  Procedure(s) Performed: Procedure(s): MINIMALLY INVASIVE REDO MITRAL VALVE (MV) REPLACEMENT (Right) MINIMALLY INVASIVE MAZE PROCEDURE (N/A) INTRAOPERATIVE TRANSESOPHAGEAL ECHOCARDIOGRAM (N/A)  Patient Location: SICU  Anesthesia Type:General  Level of Consciousness: sedated  Airway & Oxygen Therapy: Patient remains intubated per anesthesia plan and Patient placed on Ventilator (see vital sign flow sheet for setting)  Post-op Assessment: Report given to PACU RN and Post -op Vital signs reviewed and stable  Post vital signs: Reviewed and stable  Complications: No apparent anesthesia complications

## 2013-03-24 NOTE — Preoperative (Signed)
Beta Blockers   Reason not to administer Beta Blockers:Not Applicable 

## 2013-03-24 NOTE — Anesthesia Preprocedure Evaluation (Addendum)
Anesthesia Evaluation  Patient identified by MRN, date of birth, ID band Patient awake    Reviewed: Allergy & Precautions, H&P , NPO status , Patient's Chart, lab work & pertinent test results, reviewed documented beta blocker date and time   Airway Mallampati: II TM Distance: >3 FB Neck ROM: Full    Dental  (+) Edentulous Lower and Edentulous Upper   Pulmonary shortness of breath, COPD         Cardiovascular hypertension, Pt. on medications and Pt. on home beta blockers + Peripheral Vascular Disease + dysrhythmias Atrial Fibrillation  MVR 1999 Endocarditis 1999 Now 3+ MR on TEE for redo MVR via Thoracic approach.   Neuro/Psych    GI/Hepatic GERD-  Medicated and Controlled,H/o Alcohol abuse   Endo/Other    Renal/GU      Musculoskeletal   Abdominal   Peds  Hematology   Anesthesia Other Findings   Reproductive/Obstetrics                         Anesthesia Physical Anesthesia Plan  ASA: IV  Anesthesia Plan: General   Post-op Pain Management:    Induction: Intravenous  Airway Management Planned: Double Lumen EBT  Additional Equipment: Arterial line, CVP, PA Cath, 3D TEE and Ultrasound Guidance Line Placement  Intra-op Plan:   Post-operative Plan: Post-operative intubation/ventilation  Informed Consent: I have reviewed the patients History and Physical, chart, labs and discussed the procedure including the risks, benefits and alternatives for the proposed anesthesia with the patient or authorized representative who has indicated his/her understanding and acceptance.   Dental advisory given  Plan Discussed with: CRNA and Surgeon  Anesthesia Plan Comments:        Anesthesia Quick Evaluation

## 2013-03-24 NOTE — Anesthesia Postprocedure Evaluation (Signed)
Anesthesia Post Note  Patient: Jeffery Copeland  Procedure(s) Performed: Procedure(s) (LRB): MINIMALLY INVASIVE REDO MITRAL VALVE (MV) REPLACEMENT (Right) MINIMALLY INVASIVE MAZE PROCEDURE (N/A) INTRAOPERATIVE TRANSESOPHAGEAL ECHOCARDIOGRAM (N/A)  Anesthesia type: General  Patient location: ICU  Post pain: Pain level controlled  Post assessment: Post-op Vital signs reviewed  Last Vitals:  Filed Vitals:   03/24/13 0558  BP: 131/73  Pulse: 56  Temp: 36.6 C  Resp: 18    Post vital signs: stable  Level of consciousness: Patient remains intubated per anesthesia plan  Complications: No apparent anesthesia complications

## 2013-03-24 NOTE — Brief Op Note (Addendum)
      [address]       [place] [address]             [phone]     03/24/2013  1:57 PM  PATIENT:  Jeffery Copeland  67 y.o. male  PRE-OPERATIVE DIAGNOSIS:  PROTHESTIC VALVE DYSFUNSTION W/ RECURRENT MR AND A-FIB  POST-OPERATIVE DIAGNOSIS:  PROTHESTIC VALVE DYSFUNSTION W/ RECURRENT MR AND A-FIB  PROCEDURE:  Procedure(s): MINIMALLY INVASIVE REDO MITRAL VALVE (MV) REPLACEMENT#29 MITRAL EASE INTRAOPERATIVE TRANSESOPHAGEAL ECHOCARDIOGRAM  SURGEON:    Purcell Nails, MD  ASSISTANTS:  Rowe Clack, PA-C  ANESTHESIA:   Aubery Lapping, MD  CROSSCLAMP TIME:   0  CARDIOPULMONARY BYPASS TIME: 234'  FINDINGS:  Prosthetic valve dysfunction with severe mitral regurgitation  Normal LV systolic function  Mild to moderate LV hypertrophy  Small LA chamber size  Dense adhesions from previous surgery  Turbulent flow from right inferior pulmonary vein into LA noted on TEE at procedure completion without evidence for stenosis  COMPLICATIONS: none  PATIENT DISPOSITION:   TO SICU IN STABLE CONDITION  PRE-OPERATIVE WEIGHT: 78kg   OWEN,CLARENCE H 03/24/2013 4:04 PM

## 2013-03-24 NOTE — Interval H&P Note (Signed)
History and Physical Interval Note:  03/24/2013 7:33 AM  Jeffery Copeland  has presented today for surgery, with the diagnosis of PROTHESTIC VALVE DYSFUNSTION W/ RECURRENT MR AND A-FIB  The various methods of treatment have been discussed with the patient and family. After consideration of risks, benefits and other options for treatment, the patient has consented to  Procedure(s): MINIMALLY INVASIVE REDO MITRAL VALVE (MV) REPLACEMENT (Right) MINIMALLY INVASIVE MAZE PROCEDURE (N/A) INTRAOPERATIVE TRANSESOPHAGEAL ECHOCARDIOGRAM (N/A) as a surgical intervention .  The patient's history has been reviewed, patient examined, no change in status, stable for surgery.  I have reviewed the patient's chart and labs.  Questions were answered to the patient's satisfaction.     Stokes Rattigan H

## 2013-03-24 NOTE — OR Nursing (Signed)
Second call made to SICU secretary at 1416.

## 2013-03-24 NOTE — Progress Notes (Signed)
S/p redo MVR  BP 131/73  Pulse 89  Temp(Src) 94.5 F (34.7 C) (Oral)  Resp 12  Ht 5' 9.02" (1.753 m)  Wt 171 lb 15.3 oz (78 kg)  BMI 25.38 kg/m2  SpO2 97%   Intake/Output Summary (Last 24 hours) at 03/24/13 1755 Last data filed at 03/24/13 1649  Gross per 24 hour  Intake   6172 ml  Output   3950 ml  Net   2222 ml    Intubated and sedated  Doing well early postop  No significant bleeding, good cardiac output

## 2013-03-24 NOTE — OR Nursing (Signed)
First call made to SICU charge nurse and family via volunteer desk.

## 2013-03-25 ENCOUNTER — Inpatient Hospital Stay (HOSPITAL_COMMUNITY): Payer: Medicare PPO

## 2013-03-25 ENCOUNTER — Encounter (HOSPITAL_COMMUNITY): Payer: Self-pay | Admitting: Thoracic Surgery (Cardiothoracic Vascular Surgery)

## 2013-03-25 LAB — POCT I-STAT 3, ART BLOOD GAS (G3+)
Acid-Base Excess: 3 mmol/L — ABNORMAL HIGH (ref 0.0–2.0)
Bicarbonate: 27.6 mEq/L — ABNORMAL HIGH (ref 20.0–24.0)
Bicarbonate: 22.6 mEq/L (ref 20.0–24.0)
Bicarbonate: 27.7 mEq/L — ABNORMAL HIGH (ref 20.0–24.0)
O2 Saturation: 100 %
O2 Saturation: 100 %
O2 Saturation: 92 %
TCO2: 28 mmol/L (ref 0–100)
TCO2: 29 mmol/L (ref 0–100)
TCO2: 27 mmol/L (ref 0–100)
pCO2 arterial: 50.5 mmHg — ABNORMAL HIGH (ref 35.0–45.0)
pCO2 arterial: 46.6 mmHg — ABNORMAL HIGH (ref 35.0–45.0)
pCO2 arterial: 53.4 mmHg — ABNORMAL HIGH (ref 35.0–45.0)
pCO2 arterial: 43.9 mmHg (ref 35.0–45.0)
pH, Arterial: 7.416 (ref 7.350–7.450)
pH, Arterial: 7.213 — ABNORMAL LOW (ref 7.350–7.450)
pH, Arterial: 7.323 — ABNORMAL LOW (ref 7.350–7.450)
pH, Arterial: 7.342 — ABNORMAL LOW (ref 7.350–7.450)
pO2, Arterial: 74 mmHg — ABNORMAL LOW (ref 80.0–100.0)
pO2, Arterial: 67 mmHg — ABNORMAL LOW (ref 80.0–100.0)
pO2, Arterial: 438 mmHg — ABNORMAL HIGH (ref 80.0–100.0)
pO2, Arterial: 54 mmHg — ABNORMAL LOW (ref 80.0–100.0)

## 2013-03-25 LAB — POCT I-STAT GLUCOSE
Glucose, Bld: 119 mg/dL — ABNORMAL HIGH (ref 70–99)
Operator id: 282221

## 2013-03-25 LAB — GLUCOSE, CAPILLARY
Glucose-Capillary: 159 mg/dL — ABNORMAL HIGH (ref 70–99)
Glucose-Capillary: 164 mg/dL — ABNORMAL HIGH (ref 70–99)
Glucose-Capillary: 112 mg/dL — ABNORMAL HIGH (ref 70–99)
Glucose-Capillary: 115 mg/dL — ABNORMAL HIGH (ref 70–99)
Glucose-Capillary: 113 mg/dL — ABNORMAL HIGH (ref 70–99)
Glucose-Capillary: 86 mg/dL (ref 70–99)
Glucose-Capillary: 112 mg/dL — ABNORMAL HIGH (ref 70–99)
Glucose-Capillary: 123 mg/dL — ABNORMAL HIGH (ref 70–99)
Glucose-Capillary: 108 mg/dL — ABNORMAL HIGH (ref 70–99)
Glucose-Capillary: 110 mg/dL — ABNORMAL HIGH (ref 70–99)

## 2013-03-25 LAB — POCT I-STAT 4, (NA,K, GLUC, HGB,HCT)
Glucose, Bld: 100 mg/dL — ABNORMAL HIGH (ref 70–99)
Glucose, Bld: 94 mg/dL (ref 70–99)
Glucose, Bld: 88 mg/dL (ref 70–99)
Glucose, Bld: 93 mg/dL (ref 70–99)
Glucose, Bld: 137 mg/dL — ABNORMAL HIGH (ref 70–99)
HCT: 34 % — ABNORMAL LOW (ref 39.0–52.0)
HCT: 26 % — ABNORMAL LOW (ref 39.0–52.0)
HCT: 31 % — ABNORMAL LOW (ref 39.0–52.0)
HCT: 25 % — ABNORMAL LOW (ref 39.0–52.0)
HCT: 25 % — ABNORMAL LOW (ref 39.0–52.0)
Hemoglobin: 8.8 g/dL — ABNORMAL LOW (ref 13.0–17.0)
Hemoglobin: 8.5 g/dL — ABNORMAL LOW (ref 13.0–17.0)
Hemoglobin: 8.5 g/dL — ABNORMAL LOW (ref 13.0–17.0)
Hemoglobin: 8.8 g/dL — ABNORMAL LOW (ref 13.0–17.0)
Potassium: 4.4 mEq/L (ref 3.5–5.1)
Potassium: 3.9 mEq/L (ref 3.5–5.1)
Potassium: 3.4 mEq/L — ABNORMAL LOW (ref 3.5–5.1)
Potassium: 4.2 mEq/L (ref 3.5–5.1)
Sodium: 140 mEq/L (ref 135–145)
Sodium: 138 mEq/L (ref 135–145)
Sodium: 131 mEq/L — ABNORMAL LOW (ref 135–145)
Sodium: 134 mEq/L — ABNORMAL LOW (ref 135–145)

## 2013-03-25 LAB — CBC
HCT: 24.6 % — ABNORMAL LOW (ref 39.0–52.0)
MCH: 32.1 pg (ref 26.0–34.0)
MCHC: 34.1 g/dL (ref 30.0–36.0)
MCV: 93.9 fL (ref 78.0–100.0)
Platelets: 144 10*3/uL — ABNORMAL LOW (ref 150–400)
Platelets: 109 10*3/uL — ABNORMAL LOW (ref 150–400)
RBC: 2.62 MIL/uL — ABNORMAL LOW (ref 4.22–5.81)
RDW: 13.4 % (ref 11.5–15.5)
WBC: 15.5 10*3/uL — ABNORMAL HIGH (ref 4.0–10.5)
WBC: 14.2 10*3/uL — ABNORMAL HIGH (ref 4.0–10.5)

## 2013-03-25 LAB — POCT I-STAT, CHEM 8
Chloride: 104 mEq/L (ref 96–112)
Chloride: 99 mEq/L (ref 96–112)
Creatinine, Ser: 1.5 mg/dL — ABNORMAL HIGH (ref 0.50–1.35)
Glucose, Bld: 147 mg/dL — ABNORMAL HIGH (ref 70–99)
Glucose, Bld: 121 mg/dL — ABNORMAL HIGH (ref 70–99)
HCT: 26 % — ABNORMAL LOW (ref 39.0–52.0)
HCT: 26 % — ABNORMAL LOW (ref 39.0–52.0)
Potassium: 4.3 mEq/L (ref 3.5–5.1)
Potassium: 4 mEq/L (ref 3.5–5.1)

## 2013-03-25 LAB — PREPARE PLATELET PHERESIS: Unit division: 0

## 2013-03-25 LAB — BASIC METABOLIC PANEL
BUN: 15 mg/dL (ref 6–23)
Creatinine, Ser: 1.23 mg/dL (ref 0.50–1.35)
GFR calc Af Amer: 68 mL/min — ABNORMAL LOW (ref >90)
GFR calc non Af Amer: 59 mL/min — ABNORMAL LOW (ref >90)
Potassium: 3.8 mEq/L (ref 3.5–5.1)

## 2013-03-25 LAB — PREPARE FRESH FROZEN PLASMA
Unit division: 0
Unit division: 0

## 2013-03-25 LAB — MAGNESIUM
Magnesium: 2.7 mg/dL — ABNORMAL HIGH (ref 1.5–2.5)
Magnesium: 2.2 mg/dL (ref 1.5–2.5)

## 2013-03-25 LAB — CREATININE, SERUM
Creatinine, Ser: 1.2 mg/dL (ref 0.50–1.35)
GFR calc non Af Amer: 61 mL/min — ABNORMAL LOW (ref >90)

## 2013-03-25 MED ORDER — METOPROLOL TARTRATE 12.5 MG HALF TABLET
12.5000 mg | ORAL_TABLET | Freq: Two times a day (BID) | ORAL | Status: DC
  Administered 2013-03-25 – 2013-03-30 (×11): 12.5 mg via ORAL
  Filled 2013-03-25 (×15): qty 1

## 2013-03-25 MED ORDER — MORPHINE SULFATE 2 MG/ML IJ SOLN
2.0000 mg | INTRAMUSCULAR | Status: DC | PRN
  Administered 2013-03-25 (×4): 2 mg via INTRAVENOUS
  Filled 2013-03-25 (×2): qty 1

## 2013-03-25 MED ORDER — WARFARIN - PHYSICIAN DOSING INPATIENT
Freq: Every day | Status: DC
  Administered 2013-03-30: 18:00:00

## 2013-03-25 MED ORDER — AMIODARONE HCL 200 MG PO TABS
200.0000 mg | ORAL_TABLET | Freq: Two times a day (BID) | ORAL | Status: DC
  Administered 2013-03-25 – 2013-03-30 (×12): 200 mg via ORAL
  Filled 2013-03-25 (×15): qty 1

## 2013-03-25 MED ORDER — LEVALBUTEROL HCL 1.25 MG/0.5ML IN NEBU
1.2500 mg | INHALATION_SOLUTION | Freq: Four times a day (QID) | RESPIRATORY_TRACT | Status: DC | PRN
  Filled 2013-03-25: qty 0.5

## 2013-03-25 MED ORDER — FUROSEMIDE 10 MG/ML IJ SOLN
20.0000 mg | Freq: Four times a day (QID) | INTRAMUSCULAR | Status: AC
  Administered 2013-03-25 (×3): 20 mg via INTRAVENOUS
  Filled 2013-03-25 (×2): qty 2

## 2013-03-25 MED ORDER — WARFARIN SODIUM 2.5 MG PO TABS
2.5000 mg | ORAL_TABLET | Freq: Every day | ORAL | Status: DC
  Filled 2013-03-25: qty 1

## 2013-03-25 MED ORDER — INSULIN ASPART 100 UNIT/ML ~~LOC~~ SOLN
0.0000 [IU] | SUBCUTANEOUS | Status: DC
  Administered 2013-03-26: 2 [IU] via SUBCUTANEOUS

## 2013-03-25 MED ORDER — TIOTROPIUM BROMIDE MONOHYDRATE 18 MCG IN CAPS
18.0000 ug | ORAL_CAPSULE | Freq: Every day | RESPIRATORY_TRACT | Status: DC
  Administered 2013-03-25 – 2013-03-31 (×4): 18 ug via RESPIRATORY_TRACT
  Filled 2013-03-25 (×3): qty 5

## 2013-03-25 MED ORDER — POTASSIUM CHLORIDE 10 MEQ/50ML IV SOLN
10.0000 meq | INTRAVENOUS | Status: AC
  Administered 2013-03-25 (×3): 10 meq via INTRAVENOUS

## 2013-03-25 MED ORDER — LEVALBUTEROL HCL 1.25 MG/0.5ML IN NEBU
1.2500 mg | INHALATION_SOLUTION | Freq: Four times a day (QID) | RESPIRATORY_TRACT | Status: DC
  Administered 2013-03-25 – 2013-03-28 (×12): 1.25 mg via RESPIRATORY_TRACT
  Filled 2013-03-25 (×18): qty 0.5

## 2013-03-25 MED ORDER — WARFARIN SODIUM 2.5 MG PO TABS
2.5000 mg | ORAL_TABLET | Freq: Every day | ORAL | Status: DC
  Administered 2013-03-25 – 2013-03-28 (×4): 2.5 mg via ORAL
  Filled 2013-03-25 (×5): qty 1

## 2013-03-25 MED FILL — Potassium Chloride Inj 2 mEq/ML: INTRAVENOUS | Qty: 40 | Status: AC

## 2013-03-25 MED FILL — Norepinephrine Bitartrate IV Soln 1 MG/ML (Base Equivalent): INTRAMUSCULAR | Qty: 8 | Status: AC

## 2013-03-25 MED FILL — Heparin Sodium (Porcine) Inj 1000 Unit/ML: INTRAMUSCULAR | Qty: 30 | Status: AC

## 2013-03-25 MED FILL — Electrolyte-R (PH 7.4) Solution: INTRAVENOUS | Qty: 1000 | Status: AC

## 2013-03-25 MED FILL — Dextrose Inj 5%: INTRAVENOUS | Qty: 250 | Status: AC

## 2013-03-25 MED FILL — Sodium Chloride IV Soln 0.9%: INTRAVENOUS | Qty: 1000 | Status: AC

## 2013-03-25 MED FILL — Magnesium Sulfate Inj 50%: INTRAMUSCULAR | Qty: 10 | Status: AC

## 2013-03-25 NOTE — Progress Notes (Signed)
Utilization Review Completed.  

## 2013-03-25 NOTE — Progress Notes (Signed)
[address]       [place] [address]             [phone]        CARDIOTHORACIC SURGERY PROGRESS NOTE   R1 Day Post-Op Procedure(s) (LRB): MINIMALLY INVASIVE REDO MITRAL VALVE (MV) REPLACEMENT (Right) MINIMALLY INVASIVE MAZE PROCEDURE (N/A) INTRAOPERATIVE TRANSESOPHAGEAL ECHOCARDIOGRAM (N/A)  Subjective: Looks good.  Mild soreness in chest.  Breathing comfortably on ventimask  Objective: Vital signs: BP Readings from Last 1 Encounters:  03/25/13 86/57   Pulse Readings from Last 1 Encounters:  03/25/13 93   Resp Readings from Last 1 Encounters:  03/25/13 19   Temp Readings from Last 1 Encounters:  03/25/13 98.8 F (37.1 C)     Hemodynamics: PAP: (31-60)/(21-35) 48/22 mmHg CO:  [4.2 L/min-4.5 L/min] 4.5 L/min CI:  [2.1 L/min/m2-2.3 L/min/m2] 2.3 L/min/m2  Physical Exam:  Rhythm:   sinus  Breath sounds: clear  Heart sounds:  RRR w/out murmur  Incisions:  Dressings dry, intact  Abdomen:  soft  Extremities:  warm   Intake/Output from previous day: 08/26 0701 - 08/27 0700 In: 8481.7 [I.V.:5579.7; BJYNW:2956; NG/GT:30; IV Piggyback:900] Out: 5695 [Urine:3985; Blood:1400; Chest Tube:310] Intake/Output this shift:    Lab Results:  Recent Labs  03/24/13 2230 03/25/13 0500  WBC 14.5* 15.5*  HGB 8.6* 8.4*  HCT 24.9* 24.6*  PLT 134* 144*   BMET:  Recent Labs  03/24/13 2228 03/24/13 2230 03/25/13 0500  NA 140  --  138  K 4.3  --  3.8  CL 104  --  103  CO2  --   --  24  GLUCOSE 147*  --  115*  BUN 17  --  15  CREATININE 1.30 1.19 1.23  CALCIUM  --   --  8.4    CBG (last 3)   Recent Labs  03/24/13 2105 03/24/13 2204 03/24/13 2309  GLUCAP 188* 164* 107*   ABG    Component Value Date/Time   PHART 7.376 03/25/2013 0403   HCO3 25.8* 03/25/2013 0403   TCO2 27 03/25/2013 0403   ACIDBASEDEF 2.0 03/24/2013 1655   O2SAT 92.0 03/25/2013 0403   CXR: *RADIOLOGY REPORT*  Clinical Data: Postoperative radiograph following  cardiac surgery.  PORTABLE CHEST - 1 VIEW  Comparison: 03/24/2013.  Findings: Interval extubation and removal of the enteric tube.  Left IJ Swan-Ganz catheter is present with the tip in the  descending left pulmonary artery. The position is unchanged  compared yesterday. Cardiac valve replacement is present. Right  thoracostomy tube noted. Worsening basilar aeration with  increasing bilateral basilar atelectasis. Probable small bilateral  pleural effusions. No pneumothorax.  IMPRESSION:  1. Interval extubation.  2. Lower lung volumes with increasing basilar atelectasis, and  expected consequence of extubation.  3. Unchanged left IJ Swan-Ganz catheter.  4. Small bilateral pleural effusions.  Original Report Authenticated By: Andreas Newport, M.D.   Assessment/Plan: S/P Procedure(s) (LRB): MINIMALLY INVASIVE REDO MITRAL VALVE (MV) REPLACEMENT (Right) MINIMALLY INVASIVE MAZE PROCEDURE (N/A) INTRAOPERATIVE TRANSESOPHAGEAL ECHOCARDIOGRAM (N/A)  Overall doing well POD1 redo MVR using bioprosthetic tissue valve Maintaining NSR w/ stable hemodynamics on low dose milrinone and dopamine Increased O2 requirement w/ h/o severe COPD and previous LLLobectomy Expected post op acute blood loss anemia and pre-op iron-deficiency anemia, stable Expected post op volume excess   Mobilize  Wean drips  Diuresis  Xopenex nebs and restart Spiriva  Pulm toilet  Restart amiodarone  Restart coumadin slowly   Sandeep Delagarza H 03/25/2013 7:33  AM

## 2013-03-25 NOTE — Progress Notes (Signed)
Attempted to wean patient, patient met parameters but was still very sleepy, did not stay awake during the weaning process, will rest patient and try again when patient is more awake and has better control of his head control and airway.

## 2013-03-25 NOTE — Progress Notes (Signed)
Patient ID: Jeffery Copeland, male   DOB: Feb 16, 1946, 67 y.o.   MRN: [id number]  SICU Evening Rounds:  Hemodynamically stable off neo  Diuresing.  Desaturated with ambulation and put on venturi mask.  CBC    Component Value Date/Time   WBC 14.2* 03/25/2013 1617   RBC 2.62* 03/25/2013 1617   HGB 8.4* 03/25/2013 1617   HCT 24.6* 03/25/2013 1617   PLT 109* 03/25/2013 1617   MCV 93.9 03/25/2013 1617   MCH 32.1 03/25/2013 1617   MCHC 34.1 03/25/2013 1617   RDW 13.8 03/25/2013 1617   LYMPHSABS 0.7 03/17/2013 1701   MONOABS 1.2* 03/17/2013 1701   EOSABS 0.0 03/17/2013 1701   BASOSABS 0.0 03/17/2013 1701    BMET    Component Value Date/Time   NA 136 03/25/2013 1610   K 4.0 03/25/2013 1610   CL 99 03/25/2013 1610   CO2 24 03/25/2013 0500   GLUCOSE 121* 03/25/2013 1610   BUN 12 03/25/2013 1610   CREATININE 1.20 03/25/2013 1617   CALCIUM 8.4 03/25/2013 0500   GFRNONAA 61* 03/25/2013 1617   GFRAA 71* 03/25/2013 1617    Will continue diuresis as BP allows, work on pulmonary toilet.

## 2013-03-25 NOTE — Procedures (Signed)
Extubation Procedure Note  Patient Details:   Name: Jeffery Copeland DOB: [birth year] MRN: [id number]   Airway Documentation:   Patient extubated to 4 lpm nasal cannula.  VC 1000 ml, NIF -30, patient alert and able to lift head off bed.  Patient able to breathe around deflated cuff and vocalize post procedure.  No complications, tolerated well.   Evaluation  O2 sats: stable throughout Complications: No apparent complications Patient did tolerate procedure well. Bilateral Breath Sounds: Clear   Yes  Lenorris Karger, Aloha Gell 03/25/2013, 2:56 AM

## 2013-03-26 ENCOUNTER — Inpatient Hospital Stay (HOSPITAL_COMMUNITY): Payer: Medicare PPO

## 2013-03-26 LAB — GLUCOSE, CAPILLARY: Glucose-Capillary: 120 mg/dL — ABNORMAL HIGH (ref 70–99)

## 2013-03-26 LAB — CBC
HCT: 23.8 % — ABNORMAL LOW (ref 39.0–52.0)
Hemoglobin: 8.2 g/dL — ABNORMAL LOW (ref 13.0–17.0)
MCV: 94.8 fL (ref 78.0–100.0)
RDW: 13.8 % (ref 11.5–15.5)
WBC: 12.8 10*3/uL — ABNORMAL HIGH (ref 4.0–10.5)

## 2013-03-26 LAB — BASIC METABOLIC PANEL
BUN: 12 mg/dL (ref 6–23)
CO2: 28 mEq/L (ref 19–32)
Chloride: 98 mEq/L (ref 96–112)
Creatinine, Ser: 1.21 mg/dL (ref 0.50–1.35)

## 2013-03-26 MED ORDER — FUROSEMIDE 10 MG/ML IJ SOLN
40.0000 mg | Freq: Three times a day (TID) | INTRAMUSCULAR | Status: AC
  Administered 2013-03-26 (×3): 40 mg via INTRAVENOUS
  Filled 2013-03-26 (×3): qty 4

## 2013-03-26 MED ORDER — POTASSIUM CHLORIDE 10 MEQ/50ML IV SOLN
10.0000 meq | INTRAVENOUS | Status: AC
  Administered 2013-03-26 (×2): 10 meq via INTRAVENOUS
  Filled 2013-03-26: qty 100

## 2013-03-26 MED ORDER — SODIUM CHLORIDE 0.9 % IJ SOLN
3.0000 mL | Freq: Two times a day (BID) | INTRAMUSCULAR | Status: DC
  Administered 2013-03-26 – 2013-03-27 (×3): 3 mL via INTRAVENOUS
  Administered 2013-03-27: 17:00:00 via INTRAVENOUS
  Administered 2013-03-29 – 2013-03-30 (×2): 3 mL via INTRAVENOUS

## 2013-03-26 MED ORDER — SODIUM CHLORIDE 0.9 % IV SOLN: 250.0000 mL | INTRAVENOUS | Status: DC | PRN

## 2013-03-26 MED ORDER — TRAMADOL HCL 50 MG PO TABS
50.0000 mg | ORAL_TABLET | ORAL | Status: DC | PRN
  Administered 2013-03-30: 100 mg via ORAL
  Filled 2013-03-26: qty 2

## 2013-03-26 MED ORDER — FUROSEMIDE 40 MG PO TABS
40.0000 mg | ORAL_TABLET | Freq: Two times a day (BID) | ORAL | Status: DC
  Filled 2013-03-26 (×2): qty 1

## 2013-03-26 MED ORDER — POTASSIUM CHLORIDE CRYS ER 20 MEQ PO TBCR
20.0000 meq | EXTENDED_RELEASE_TABLET | Freq: Two times a day (BID) | ORAL | Status: DC
  Administered 2013-03-27 – 2013-03-30 (×8): 20 meq via ORAL
  Filled 2013-03-26 (×11): qty 1

## 2013-03-26 MED ORDER — SODIUM CHLORIDE 0.9 % IJ SOLN: 3.0000 mL | INTRAMUSCULAR | Status: DC | PRN

## 2013-03-26 MED ORDER — MOVING RIGHT ALONG BOOK
Freq: Once | Status: AC
  Administered 2013-03-26: 11:00:00
  Filled 2013-03-26: qty 1

## 2013-03-26 NOTE — Progress Notes (Signed)
      [address]       [place] [address]             [phone]        CARDIOTHORACIC SURGERY PROGRESS NOTE   R2 Days Post-Op Procedure(s) (LRB): MINIMALLY INVASIVE REDO MITRAL VALVE (MV) REPLACEMENT (Right) MINIMALLY INVASIVE MAZE PROCEDURE (N/A) INTRAOPERATIVE TRANSESOPHAGEAL ECHOCARDIOGRAM (N/A)  Subjective: Feels well but didn't sleep much.  Denies pain except some soreness w/ ambulation.  Desaturates w/ ambulation, comfortable w/ O2 sats > 95% on 3 L/min at rest.  Objective: Vital signs: BP Readings from Last 1 Encounters:  03/26/13 116/69   Pulse Readings from Last 1 Encounters:  03/26/13 98   Resp Readings from Last 1 Encounters:  03/26/13 25   Temp Readings from Last 1 Encounters:  03/26/13 99.2 F (37.3 C) Oral    Hemodynamics: PAP: (58-76)/(25-35) 63/27 mmHg CO:  [5.8 L/min] 5.8 L/min CI:  [3 L/min/m2] 3 L/min/m2  Physical Exam:  Rhythm:   sinus  Breath sounds: clear  Heart sounds:  RRR w/out murmur  Incisions:  Dressings dry  Abdomen:  Soft, non-distended, non-tender  Extremities:  Warm, well-perfused    Intake/Output from previous day: 08/27 0701 - 08/28 0700 In: 1281.2 [P.O.:660; I.V.:471.2; IV Piggyback:150] Out: 2690 [Urine:2390; Chest Tube:300] Intake/Output this shift:    Lab Results:  Recent Labs  03/25/13 1617 03/26/13 0455  WBC 14.2* 12.8*  HGB 8.4* 8.2*  HCT 24.6* 23.8*  PLT 109* 98*   BMET:  Recent Labs  03/25/13 0500 03/25/13 1610 03/25/13 1617 03/26/13 0455  NA 138 136  --  135  K 3.8 4.0  --  4.1  CL 103 99  --  98  CO2 24  --   --  28  GLUCOSE 115* 121*  --  134*  BUN 15 12  --  12  CREATININE 1.23 1.50* 1.20 1.21  CALCIUM 8.4  --   --  8.4    CBG (last 3)   Recent Labs  03/25/13 2012 03/26/13 0028 03/26/13 0425  GLUCAP 110* 120* 112*   ABG    Component Value Date/Time   PHART 7.376 03/25/2013 0403   PCO2ART 43.9 03/25/2013 0403   PO2ART 67.0* 03/25/2013 0403   HCO3 25.8*  03/25/2013 0403   TCO2 25 03/25/2013 1610   ACIDBASEDEF 2.0 03/24/2013 1655   O2SAT 92.0 03/25/2013 0403   CXR: Stable w/ mild congestion and bibasilar atelectasis  Assessment/Plan: S/P Procedure(s) (LRB): MINIMALLY INVASIVE REDO MITRAL VALVE (MV) REPLACEMENT (Right) MINIMALLY INVASIVE MAZE PROCEDURE (N/A) INTRAOPERATIVE TRANSESOPHAGEAL ECHOCARDIOGRAM (N/A)  Doing well POD2 Maintaining NSR w/ stable hemodynamics Still w/ increased O2 requirement but improving Long-standing COPD w/ previous LLLobectomy Expected post op acute blood loss anemia, moderate, stable Expected post op volume excess, diuresing Post op thrombocytopenia, mild   Mobilize  Diuresis  Leave chest tubes 1 more day  Transfer step down once O2 requirement decreased   Kharon Hixon H 03/26/2013 8:09 AM

## 2013-03-26 NOTE — Progress Notes (Signed)
CT surgery p.m. Rounds Patient examined and record reviewed.Hemodynamics stable,labs satisfactory.Patient had stable day.Continue current care. VAN TRIGT III,PETER 03/26/2013

## 2013-03-27 ENCOUNTER — Inpatient Hospital Stay (HOSPITAL_COMMUNITY): Payer: Medicare PPO

## 2013-03-27 LAB — CBC
HCT: 22.1 % — ABNORMAL LOW (ref 39.0–52.0)
MCV: 94.4 fL (ref 78.0–100.0)
RBC: 2.34 MIL/uL — ABNORMAL LOW (ref 4.22–5.81)
WBC: 10.1 10*3/uL (ref 4.0–10.5)

## 2013-03-27 LAB — BASIC METABOLIC PANEL
BUN: 12 mg/dL (ref 6–23)
CO2: 31 mEq/L (ref 19–32)
Chloride: 97 mEq/L (ref 96–112)
Creatinine, Ser: 1.08 mg/dL (ref 0.50–1.35)

## 2013-03-27 MED ORDER — FUROSEMIDE 40 MG PO TABS
40.0000 mg | ORAL_TABLET | Freq: Two times a day (BID) | ORAL | Status: DC
  Administered 2013-03-28 – 2013-03-30 (×6): 40 mg via ORAL
  Filled 2013-03-27 (×9): qty 1

## 2013-03-27 MED ORDER — POTASSIUM CHLORIDE 10 MEQ/50ML IV SOLN
INTRAVENOUS | Status: AC
  Filled 2013-03-27: qty 300

## 2013-03-27 MED ORDER — POTASSIUM CHLORIDE 10 MEQ/50ML IV SOLN
10.0000 meq | INTRAVENOUS | Status: AC
  Administered 2013-03-27 (×6): 10 meq via INTRAVENOUS

## 2013-03-27 MED ORDER — EZETIMIBE 10 MG PO TABS
10.0000 mg | ORAL_TABLET | Freq: Every day | ORAL | Status: DC
  Administered 2013-03-27 – 2013-03-30 (×4): 10 mg via ORAL
  Filled 2013-03-27 (×5): qty 1

## 2013-03-27 MED ORDER — FUROSEMIDE 10 MG/ML IJ SOLN
INTRAMUSCULAR | Status: AC
  Filled 2013-03-27: qty 4

## 2013-03-27 MED ORDER — RAMIPRIL 5 MG PO CAPS
5.0000 mg | ORAL_CAPSULE | Freq: Every day | ORAL | Status: DC
  Administered 2013-03-28 – 2013-03-30 (×3): 5 mg via ORAL
  Filled 2013-03-27 (×4): qty 1

## 2013-03-27 MED ORDER — FUROSEMIDE 10 MG/ML IJ SOLN
20.0000 mg | Freq: Once | INTRAMUSCULAR | Status: AC
  Administered 2013-03-27: 20 mg via INTRAVENOUS

## 2013-03-27 NOTE — Progress Notes (Addendum)
      [address]       [place] [address]             [phone]        CARDIOTHORACIC SURGERY PROGRESS NOTE   R3 Days Post-Op Procedure(s) (LRB): MINIMALLY INVASIVE REDO MITRAL VALVE (MV) REPLACEMENT (Right) MINIMALLY INVASIVE MAZE PROCEDURE (N/A) INTRAOPERATIVE TRANSESOPHAGEAL ECHOCARDIOGRAM (N/A)  Subjective: Looks good and reports feeling better, although he went into rate-controlled Afib overnight.    Objective: Vital signs: BP Readings from Last 1 Encounters:  03/27/13 81/57   Pulse Readings from Last 1 Encounters:  03/27/13 84   Resp Readings from Last 1 Encounters:  03/27/13 16   Temp Readings from Last 1 Encounters:  03/27/13 98.3 F (36.8 C) Oral    Hemodynamics:    Physical Exam:  Rhythm:   Afib w/ HR 80-90  Breath sounds: clear  Heart sounds:  irreg w/out murmur  Incisions:  Dressings intact  Abdomen:  Soft, non-distended, non-tender  Extremities:  Warm, well-perfused    Intake/Output from previous day: 08/28 0701 - 08/29 0700 In: 1020 [P.O.:720; I.V.:200; IV Piggyback:100] Out: 5005 [Urine:4800; Chest Tube:205] Intake/Output this shift:    Lab Results:  Recent Labs  03/26/13 0455 03/27/13 0445  WBC 12.8* 10.1  HGB 8.2* 7.7*  HCT 23.8* 22.1*  PLT 98* 93*   BMET:  Recent Labs  03/26/13 0455 03/27/13 0445  NA 135 136  K 4.1 3.3*  CL 98 97  CO2 28 31  GLUCOSE 134* 111*  BUN 12 12  CREATININE 1.21 1.08  CALCIUM 8.4 8.3*    CBG (last 3)   Recent Labs  03/26/13 1200 03/26/13 2037 03/27/13 0459  GLUCAP 116* 115* 125*   ABG    Component Value Date/Time   PHART 7.376 03/25/2013 0403   PCO2ART 43.9 03/25/2013 0403   PO2ART 67.0* 03/25/2013 0403   HCO3 25.8* 03/25/2013 0403   TCO2 25 03/25/2013 1610   ACIDBASEDEF 2.0 03/24/2013 1655   O2SAT 92.0 03/25/2013 0403   CXR: n/a  Assessment/Plan: S/P Procedure(s) (LRB): MINIMALLY INVASIVE REDO MITRAL VALVE (MV) REPLACEMENT (Right) MINIMALLY INVASIVE MAZE  PROCEDURE (N/A) INTRAOPERATIVE TRANSESOPHAGEAL ECHOCARDIOGRAM (N/A)  Overall doing well POD3 Post-op Afib, rate-controlled w/ stable hemodynamics although BP slightly decreased O2 requirement decreased Expected post op acute blood loss anemia, worse Expected post op volume excess, mild, diuresing well Hypokalemia, diuretic-induced COPD w/ previous LLLobectomy Impulsive behavior reported by nursing, attempts to get out of bed on his own   Transfuse 2 units PRBCs  D/C pacing wires  D/C chest tubes  Mobilize  Diuresis  Supplement K+  Continue amiodarone, coumadin  Transfer step down once wires and tubes out    OWEN,CLARENCE H 03/27/2013 8:09 AM

## 2013-03-27 NOTE — Progress Notes (Signed)
Report called to [place] on unit 2000.  Pt transferred to 2000.  All VS WNL on transport.  Pt transferred via wheelchair.

## 2013-03-27 NOTE — Progress Notes (Signed)
D/c'd epicardial wires per MD order and protocol.  Pt tolerated well.  No arrythmias noted.  Wires both intact upon removal.

## 2013-03-28 ENCOUNTER — Inpatient Hospital Stay (HOSPITAL_COMMUNITY): Payer: Medicare PPO

## 2013-03-28 LAB — TYPE AND SCREEN
ABO/RH(D): A POS
Antibody Screen: NEGATIVE
Unit division: 0
Unit division: 0
Unit division: 0
Unit division: 0

## 2013-03-28 LAB — CBC
HCT: 30.2 % — ABNORMAL LOW (ref 39.0–52.0)
Hemoglobin: 10.3 g/dL — ABNORMAL LOW (ref 13.0–17.0)
MCH: 30.7 pg (ref 26.0–34.0)
MCV: 89.9 fL (ref 78.0–100.0)
RBC: 3.36 MIL/uL — ABNORMAL LOW (ref 4.22–5.81)
WBC: 9 10*3/uL (ref 4.0–10.5)

## 2013-03-28 LAB — BASIC METABOLIC PANEL
BUN: 13 mg/dL (ref 6–23)
CO2: 29 mEq/L (ref 19–32)
Calcium: 8.7 mg/dL (ref 8.4–10.5)
Chloride: 98 mEq/L (ref 96–112)
Creatinine, Ser: 1.07 mg/dL (ref 0.50–1.35)
Glucose, Bld: 103 mg/dL — ABNORMAL HIGH (ref 70–99)

## 2013-03-28 NOTE — Progress Notes (Signed)
Pt got himself back to bed stating that he is tired from a busy day.  He declines ambulating in hall at present. Importance of activity reinforced. He promises to walk early in the morning around 6am.

## 2013-03-28 NOTE — Progress Notes (Signed)
Pt changed his mind and ambulated in hall approx. 400 feet with oxygen at 2L, rolling walker and standby assist.  He tolerated without increased pain and a little increase in effort of breathing.  Back to bed, call bell in reach, instructed to call for assistance OOB, SCD's on.

## 2013-03-28 NOTE — Progress Notes (Signed)
Pt ambulated 100 feet with rolling walker and O2; new dressing applied to chest tube suture sight; chest tube sutures oozing at this time; pt back to room to chair; will cont. To monitor.

## 2013-03-28 NOTE — Progress Notes (Addendum)
[address]       [place] [address]             [phone]          4 Days Post-Op Procedure(s) (LRB): MINIMALLY INVASIVE REDO MITRAL VALVE (MV) REPLACEMENT (Right) MINIMALLY INVASIVE MAZE PROCEDURE (N/A) INTRAOPERATIVE TRANSESOPHAGEAL ECHOCARDIOGRAM (N/A)  Subjective: Feels better today, no complaints.   Objective: Vital signs in last 24 hours: Patient Vitals for the past 24 hrs:  BP Temp Temp src Pulse Resp SpO2 Weight  03/28/13 0428 110/72 mmHg 99.4 F (37.4 C) Oral 84 18 97 % 172 lb (78.019 kg)  03/27/13 2214 121/75 mmHg - - - - - -  03/27/13 1911 118/75 mmHg 97.6 F (36.4 C) Oral 81 20 100 % -  03/27/13 1800 108/68 mmHg - - 71 19 99 % -  03/27/13 1700 125/77 mmHg - - 99 30 97 % -  03/27/13 1600 114/56 mmHg - - 79 18 95 % -  03/27/13 1515 118/63 mmHg 98.3 F (36.8 C) Oral 83 20 - -  03/27/13 1500 118/63 mmHg - - 66 29 74 % -  03/27/13 1453 - - - - - 99 % -  03/27/13 1425 103/83 mmHg 97.6 F (36.4 C) Oral 69 26 - -  03/27/13 1400 87/56 mmHg - - 83 15 100 % -  03/27/13 1325 121/77 mmHg 97.8 F (36.6 C) Oral - 33 - -  03/27/13 1300 121/77 mmHg - - - 26 - -  03/27/13 1255 108/73 mmHg 97.8 F (36.6 C) Oral 26 22 - -  03/27/13 1240 108/73 mmHg 97.8 F (36.6 C) Oral 83 16 - -  03/27/13 1225 108/73 mmHg 97.8 F (36.6 C) Oral - 14 - -  03/27/13 1215 113/75 mmHg 97.8 F (36.6 C) Oral - 28 - -  03/27/13 1200 - - - 105 33 82 % -  03/27/13 1145 113/75 mmHg 98 F (36.7 C) Axillary 82 18 - -  03/27/13 1100 115/73 mmHg 98 F (36.7 C) Axillary 32 22 - -  03/27/13 1015 96/65 mmHg 98.2 F (36.8 C) Axillary 56 17 - -  03/27/13 1000 88/74 mmHg 98.2 F (36.8 C) Rectal 75 18 - -  03/27/13 0945 88/74 mmHg 98.2 F (36.8 C) Axillary 101 22 - -  03/27/13 0915 96/52 mmHg 98.2 F (36.8 C) Axillary - 20 - -  03/27/13 0911 - - - - - 96 % -  03/27/13 0900 91/71 mmHg 97.6 F (36.4 C) Axillary 84 22 83 % -  03/27/13 0845 - 98.1 F (36.7 C) Oral 122 29 -  -   Current Weight  03/28/13 172 lb (78.019 kg)  PRE-OPERATIVE WEIGHT: 78kg    Intake/Output from previous day: 08/29 0701 - 08/30 0700 In: 2230 [P.O.:1200; Blood:730; IV Piggyback:300] Out: 3390 [Urine:3300; Chest Tube:90]    PHYSICAL EXAM:  Heart: Irr irr, rates in 80s Lungs: Few crackles in bases bilaterally Wound: Clean and dry Extremities: No significant LE edema   Lab Results: CBC: Recent Labs  03/27/13 0445 03/28/13 0500  WBC 10.1 9.0  HGB 7.7* 10.3*  HCT 22.1* 30.2*  PLT 93* 125*   BMET:  Recent Labs  03/27/13 0445 03/28/13 0500  NA 136 135  K 3.3* 4.0  CL 97 98  CO2 31 29  GLUCOSE 111* 103*  BUN 12 13  CREATININE 1.08 1.07  CALCIUM 8.3* 8.7    PT/INR:  Recent Labs  03/28/13 0500  LABPROT  14.2  INR 1.12      Assessment/Plan: S/P Procedure(s) (LRB): MINIMALLY INVASIVE REDO MITRAL VALVE (MV) REPLACEMENT (Right) MINIMALLY INVASIVE MAZE PROCEDURE (N/A) INTRAOPERATIVE TRANSESOPHAGEAL ECHOCARDIOGRAM (N/A)  CV- rate controlled AF.  BPs improved after transfusion. Continue Amio, low dose Lopressor, Altace and watch SBPs. Continue Coumadin load.  Vol overload- po Lasix started today.  Expected postop blood loss anemia- improved after transfusion.  CXR not done yet this am. Will follow.  Pulm- continue COPD meds, wean O2 as tolerated.  CRPI/ambulation.   LOS: 4 days    COLLINS,GINA H 03/28/2013  I have seen and examined the patient and agree with the assessment and plan as outlined.  CXR still not done.  Will reorder.  Marshal Eskew H 03/28/2013 12:26 PM

## 2013-03-28 NOTE — Progress Notes (Signed)
Pt ambulated 250 feet with NT and RN with rolling walker and O2; pt back to room to chair; call bell w/i reach; sister in room; will cont. To monitor.

## 2013-03-28 NOTE — Progress Notes (Signed)
CARDIAC REHAB PHASE I   Pt just completed ambulation with RN upon arrival.  Nurse stated pt walked approximately 100 ft due to foot feeling numb.  Completed education with pt and he is interested in CRP II in [place].  Pt also mention his sister being a helpful source to help him maintain healthy habits and lifestyle.  Marvene Staff MS, ACSM RCEP 11:48 AM 03/28/2013

## 2013-03-29 MED ORDER — WARFARIN SODIUM 5 MG PO TABS
5.0000 mg | ORAL_TABLET | Freq: Every day | ORAL | Status: DC
  Administered 2013-03-29 – 2013-03-30 (×2): 5 mg via ORAL
  Filled 2013-03-29 (×3): qty 1

## 2013-03-29 MED ORDER — LEVALBUTEROL HCL 1.25 MG/0.5ML IN NEBU
1.2500 mg | INHALATION_SOLUTION | RESPIRATORY_TRACT | Status: DC | PRN
  Filled 2013-03-29: qty 0.5

## 2013-03-29 MED ORDER — LEVALBUTEROL HCL 1.25 MG/0.5ML IN NEBU
1.2500 mg | INHALATION_SOLUTION | Freq: Four times a day (QID) | RESPIRATORY_TRACT | Status: DC
  Administered 2013-03-29 (×2): 1.25 mg via RESPIRATORY_TRACT
  Filled 2013-03-29 (×5): qty 0.5

## 2013-03-29 MED ORDER — LEVALBUTEROL HCL 1.25 MG/0.5ML IN NEBU
1.2500 mg | INHALATION_SOLUTION | Freq: Three times a day (TID) | RESPIRATORY_TRACT | Status: DC
  Administered 2013-03-30 (×3): 1.25 mg via RESPIRATORY_TRACT
  Filled 2013-03-29 (×7): qty 0.5

## 2013-03-29 NOTE — Progress Notes (Signed)
Pt ambulated 350 feet with rolling walker, O2 2L [place], and RN; steady gait noted; no rest breaks needed; pt SOB upon arrival back to room; pt to chair after ambulation; call bell w/i reach; son in room; will cont. To monitor.

## 2013-03-29 NOTE — Progress Notes (Signed)
Pt ambulated 550 feet with rolling walker, RN, and O2 2L [place]; pt tolerated ambulation well; pt with SOB upon completion of walk; pt to chair with call bell w/i reach after walk; will cont. To monitor.

## 2013-03-29 NOTE — Progress Notes (Addendum)
       [address]       [place] [address]             [phone]          5 Days Post-Op Procedure(s) (LRB): MINIMALLY INVASIVE REDO MITRAL VALVE (MV) REPLACEMENT (Right) MINIMALLY INVASIVE MAZE PROCEDURE (N/A) INTRAOPERATIVE TRANSESOPHAGEAL ECHOCARDIOGRAM (N/A)  Subjective: No complaints, breathing stable.   Objective: Vital signs in last 24 hours: Patient Vitals for the past 24 hrs:  BP Temp Temp src Pulse Resp SpO2 Weight  03/29/13 0611 - - - - - - 171 lb 4.8 oz (77.701 kg)  03/29/13 0424 110/73 mmHg 99 F (37.2 C) Oral 72 19 97 % -  03/28/13 2202 107/47 mmHg - - 82 - - -  03/28/13 2114 - - - - - 99 % -  03/28/13 1950 100/64 mmHg 97.9 F (36.6 C) Oral 73 19 98 % -  03/28/13 1455 101/59 mmHg 97.8 F (36.6 C) Oral 72 18 98 % -  03/28/13 1124 - - - - - 94 % -  03/28/13 1004 104/68 mmHg - - 71 - 98 % -   Current Weight  03/29/13 171 lb 4.8 oz (77.701 kg)  PRE-OPERATIVE WEIGHT: 78kg    Intake/Output from previous day: 08/30 0701 - 08/31 0700 In: 1380 [P.O.:1380] Out: 2925 [Urine:2925]    PHYSICAL EXAM:  Heart: Irr irr Lungs: Slightly decreased BS in bases Wound: Clean and dry Extremities: Trace LE edema    Lab Results: CBC: Recent Labs  03/27/13 0445 03/28/13 0500  WBC 10.1 9.0  HGB 7.7* 10.3*  HCT 22.1* 30.2*  PLT 93* 125*   BMET:  Recent Labs  03/27/13 0445 03/28/13 0500  NA 136 135  K 3.3* 4.0  CL 97 98  CO2 31 29  GLUCOSE 111* 103*  BUN 12 13  CREATININE 1.08 1.07  CALCIUM 8.3* 8.7    PT/INR:  Recent Labs  03/29/13 0407  LABPROT 15.0  INR 1.21   CXR (8/30):Findings: Chronic volume loss and scarring in the left lung base.  Mild right lower lobe atelectasis. Mitral valve replacement.  Negative for heart failure. Multiple thoracic compression  fractures.  IMPRESSION:  No significant change.    Assessment/Plan: S/P Procedure(s) (LRB): MINIMALLY INVASIVE REDO MITRAL VALVE (MV) REPLACEMENT  (Right) MINIMALLY INVASIVE MAZE PROCEDURE (N/A) INTRAOPERATIVE TRANSESOPHAGEAL ECHOCARDIOGRAM (N/A) CV- rate controlled AF. BPs stable. Continue Amio, low dose Lopressor, Altace and watch SBPs. Continue Coumadin load. Vol overload- continue diuresis. Expected postop blood loss anemia- improved after transfusion.   Pulm- continue COPD meds, wean O2 as tolerated. May ultimately need home O2. CRPI/ambulation. Possibly home 2-3 days if he continues to progress.    LOS: 5 days    COLLINS,GINA H 03/29/2013  I have seen and examined the patient and agree with the assessment and plan as outlined.  Possibly ready for d/c home with his sister in 2-3 days.  Zayvian Mcmurtry H 03/29/2013 12:04 PM

## 2013-03-29 NOTE — Progress Notes (Signed)
Pt transferred to 2W26; report given to Fayrene Fearing, [place].

## 2013-03-30 LAB — PROTIME-INR: INR: 1.33 (ref 0.00–1.49)

## 2013-03-30 NOTE — Progress Notes (Addendum)
       [address]       [place] [address]             [phone]          6 Days Post-Op Procedure(s) (LRB): MINIMALLY INVASIVE REDO MITRAL VALVE (MV) REPLACEMENT (Right) MINIMALLY INVASIVE MAZE PROCEDURE (N/A) INTRAOPERATIVE TRANSESOPHAGEAL ECHOCARDIOGRAM (N/A)  Subjective: Feels well, no complaints this am.   Objective: Vital signs in last 24 hours: Patient Vitals for the past 24 hrs:  BP Temp Temp src Pulse Resp SpO2 Weight  03/30/13 0514 107/61 mmHg 98.8 F (37.1 C) Oral 70 18 99 % 173 lb 4.8 oz (78.608 kg)  03/29/13 2043 114/69 mmHg 97.8 F (36.6 C) Oral 68 18 98 % -  03/29/13 1640 - - - - - 95 % -  03/29/13 1416 99/52 mmHg - - 63 - - -  03/29/13 1359 88/60 mmHg 98.2 F (36.8 C) Oral 68 19 98 % -  03/29/13 0954 125/67 mmHg - - 77 - - -   Current Weight  03/30/13 173 lb 4.8 oz (78.608 kg)  PRE-OPERATIVE WEIGHT: 78kg    Intake/Output from previous day: 08/31 0701 - 09/01 0700 In: 1020 [P.O.:1020] Out: 3800 [Urine:3800]    PHYSICAL EXAM:  Heart: Irr irr Lungs: Clear Wound: Clean and dry Extremities: No significant edema    Lab Results: CBC: Recent Labs  03/28/13 0500  WBC 9.0  HGB 10.3*  HCT 30.2*  PLT 125*   BMET:  Recent Labs  03/28/13 0500  NA 135  K 4.0  CL 98  CO2 29  GLUCOSE 103*  BUN 13  CREATININE 1.07  CALCIUM 8.7    PT/INR:  Recent Labs  03/30/13 0450  LABPROT 16.2*  INR 1.33      Assessment/Plan: S/P Procedure(s) (LRB): MINIMALLY INVASIVE REDO MITRAL VALVE (MV) REPLACEMENT (Right) MINIMALLY INVASIVE MAZE PROCEDURE (N/A) INTRAOPERATIVE TRANSESOPHAGEAL ECHOCARDIOGRAM (N/A) CV- rate controlled AF. BPs stable. Continue Amio, low dose Lopressor, Altace, Coumadin. Vol overload- continue diuresis.  Expected postop blood loss anemia- improved after transfusion. F/u labs in am Pulm- continue COPD meds, wean O2 as tolerated. He is down to 2L, but ultimately may need home O2.  CRPI/ambulation.    Hopefully home in am if he remains stable.   LOS: 6 days    COLLINS,GINA H 03/30/2013  I have seen and examined the patient and agree with the assessment and plan as outlined.  Possibly d/c to his sister's home 1-2 days.  Looks good.  OWEN,CLARENCE H 03/30/2013 10:56 AM

## 2013-03-30 NOTE — Discharge Summary (Signed)
[address]       [place] [address]             [phone]              Discharge Summary  Name: Jeffery Copeland DOB: 21-May-1946 68 y.o. MRN: [id number]   Admission Date: 03/24/2013 Discharge Date: 03/31/2013    Admitting Diagnosis: Severe mitral regurgitation Prosthetic valve dysfunction   Discharge Diagnosis:  Severe mitral regurgitation Prosthetic valve dysfunction Expected postoperative blood loss anemia Postoperative atrial fibrillation  Past Medical History  Diagnosis Date  . 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 Cerebrovascular disease, unspecified   . Esophageal reflux   . Generalized osteoarthrosis, unspecified site   . Hypertension   . COPD (chronic obstructive pulmonary disease) 1999  . Lung nodules 2008, 2014    LLL nodule removed 2008 with LLL lobectomy,  RLL nodule 3mm observation  . Atrial fibrillation 11/11/2008    Qualifier: Diagnosis of  By: Riley Kill, MD, Johny Sax   . CARDIOMYOPATHY 10/20/2008    Qualifier: Diagnosis of  By: Manson Passey, RN, BSN, Lauren    . ISCHEMIC COLITIS, HX OF 10/20/2008    Qualifier: Diagnosis of  By: Manson Passey, RN, BSN, Lauren    . 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 HYPERTENSION, BENIGN 10/20/2008    Qualifier: Diagnosis of  By: Manson Passey, RN, BSN, Lauren    . Epidural hematoma 03/01/2011    Recent fall 2012   . Severe mitral regurgitation 02/20/2013  . Iron deficiency anemia, unspecified   . S/P mitral valve replacement with bioprosthetic valve 03/24/2013    Redo mitral valve replacement using 29mm Edwards Magna mitral bovine bioprosthetic tissue valve performed via right mini thoracotomy      Procedures: MINIMALLY INVASIVE REDO MITRAL VALVE REPLACEMENT (29 mm Edwards Magna  pericardial mitral valve) - 03/24/2013   HPI:  The patient is a 67 y.o. male who underwent mitral valve replacement using a porcine bioprosthetic tissue valve in 1999 for complicated bacterial endocarditis. Findings at the time of surgery were consistent with likely chronic mitral valve prolapse with mitral regurgitation that subsequently became complicated by acute and subacute bacterial endocarditis with group G. Streptococcus. A bioprosthetic tissue valve was chosen at the time because of the patient's ongoing troubles with alcohol abuse and history of seizure disorder. The patient recovered uneventfully. The patient has been followed by Dr. Riley Kill for several years with history of persistent atrial fibrillation on chronic Coumadin therapy. He has done reasonably well with this, although apparently he did suffer an epidural hematoma in 2012 at which time he was notably supratherapeutic on Coumadin.   Over the past 2 years, the patient has developed progressive symptoms of exertional shortness of breath. He was noted to have a prominent systolic murmur on exam by both Dr. Riley Kill and Dr. Delford Field, but transthoracic echocardiogram performed in February of this year did not demonstrate much of any mitral regurgitation. The patient was subsequently seen in followup earlier this month by Dr. Shirlee Latch, who reviewed the patient's most recent transthoracic echocardiogram and noted that the presence of significant acoustic shadowing might be missing the presence of significant mitral regurgitation. Subsequently, a transesophageal echocardiogram was performed 02/12/2013 by Dr. Tenny Craw. This confirmed the presence of severe prosthetic valve dysfunction with severe  mitral regurgitation. Left and right heart catheterization was performed by Dr. Shirlee Latch. This confirmed the absence of significant coronary artery disease and was notable for the presence of mild pulmonary hypertension with prominent V waves on pulmonary capillary  wedge tracing. The patient was referred to Dr. Cornelius Moras  for surgical consultation. It was felt that he would benefit from elective redo mitral valve replacement at this time, and plans were to use a minimally invasive approach. All risks, benefits and alternatives of surgery were explained in detail, and the patient agreed to proceed.   Hospital Course:  The patient was admitted to [facility] on 03/24/2013. The patient was taken to the operating room and underwent the above procedure.    The postoperative course was notable for atrial fibrillation, which remained rate controlled. He was treated with Amiodarone, Lopressor and Coumadin.  He was aggressively diuresed for volume overload.  He did have a postop blood loss anemia which required transfusion. He was treated with aggressive pulmonary toilet measures and inhaler therapy based on his history of COPD, and was ultimately able to be weaned from supplemental oxygen.  He progressed well otherwise.  He is ambulating in the halls without difficulty and is tolerating a regular diet.  Incisions are healing well.  INR is trending upward appropriately.  He currently is medically stable for discharge home.  \   Recent vital signs:  Filed Vitals:   03/30/13 0514  BP: 107/61  Pulse: 70  Temp: 98.8 F (37.1 C)  Resp: 18    Recent laboratory studies:  CBC: Recent Labs  03/28/13 0500  WBC 9.0  HGB 10.3*  HCT 30.2*  PLT 125*   BMET:  Recent Labs  03/28/13 0500  NA 135  K 4.0  CL 98  CO2 29  GLUCOSE 103*  BUN 13  CREATININE 1.07  CALCIUM 8.7    PT/INR:  Recent Labs  03/30/13 0450  LABPROT 16.2*  INR 1.33     Discharge Medications:     Medication List    STOP taking these medications       HYDROcodone-acetaminophen 10-500 MG per tablet  Commonly known as:  LORTAB      TAKE these medications       amiodarone 200 MG tablet  Commonly known as:  PACERONE  Take 1 tablet (200 mg total) by mouth 2 (two) times daily. Begin 7  days prior to surgery.     amiodarone 200 MG tablet  Commonly known as:  PACERONE  Take 1 tablet (200 mg total) by mouth 2 (two) times daily.     aspirin 81 MG EC tablet  Take 1 tablet (81 mg total) by mouth daily.     ezetimibe 10 MG tablet  Commonly known as:  ZETIA  Take 1 tablet (10 mg total) by mouth daily.     furosemide 40 MG tablet  Commonly known as:  LASIX  Take 1 tablet (40 mg total) by mouth daily.     metoprolol tartrate 12.5 mg Tabs tablet  Commonly known as:  LOPRESSOR  Take 0.5 tablets (12.5 mg total) by mouth 2 (two) times daily.     omeprazole 20 MG capsule  Commonly known as:  PRILOSEC  Take 20 mg by mouth daily.     oxyCODONE 5 MG immediate release tablet  Commonly known as:  Oxy IR/ROXICODONE  Take 1-2 tablets (5-10 mg total) by mouth every 4 (four) hours as needed.     potassium chloride SA 20 MEQ tablet  Commonly known as:  K-DUR,KLOR-CON  Take 1 tablet (20 mEq total) by mouth daily.     ramipril 5 MG capsule  Commonly known as:  ALTACE  Take 1 capsule (5 mg total) by mouth daily.     tiotropium 18 MCG inhalation capsule  Commonly known as:  SPIRIVA  Place 18 mcg into inhaler and inhale daily.     Vitamin D3 1000 UNITS Caps  Take 1 capsule by mouth daily.     warfarin 5 MG tablet  Commonly known as:  COUMADIN  Take 1 tablet (5 mg total) by mouth daily at 6 PM. As directed by coumadin clinic     zolpidem 5 MG tablet  Commonly known as:  AMBIEN  Take 5 mg by mouth at bedtime as needed. For insomnia.        Discharge Instructions:  The patient is to refrain from driving, heavy lifting or strenuous activity.  May shower daily and clean incisions with soap and water.  May resume regular diet.   Follow Up:      Discharge Orders   Future Orders Complete By Expires   Amb Referral to Cardiac Rehabilitation  As directed       Follow-up Information   Follow up with Purcell Nails, MD In 1 week. (Office will call to arrange appointment)     Specialty:  Cardiothoracic Surgery   Contact information:   [address] [place] [address] [phone]       Follow up with Marca Ancona, MD. Schedule an appointment as soon as possible for a visit in 2 weeks.   Specialty:  Cardiology   Contact information:   [address] [place] [address] [phone]        Adella Hare 03/30/2013, 11:27 AM

## 2013-03-31 ENCOUNTER — Inpatient Hospital Stay (HOSPITAL_COMMUNITY): Payer: Medicare PPO

## 2013-03-31 LAB — COMPREHENSIVE METABOLIC PANEL
ALT: 20 U/L (ref 0–53)
AST: 17 U/L (ref 0–37)
Alkaline Phosphatase: 57 U/L (ref 39–117)
CO2: 31 mEq/L (ref 19–32)
Calcium: 9.3 mg/dL (ref 8.4–10.5)
GFR calc Af Amer: 71 mL/min — ABNORMAL LOW (ref >90)
GFR calc non Af Amer: 61 mL/min — ABNORMAL LOW (ref >90)
Glucose, Bld: 116 mg/dL — ABNORMAL HIGH (ref 70–99)
Potassium: 4.6 mEq/L (ref 3.5–5.1)
Sodium: 135 mEq/L (ref 135–145)
Total Protein: 6.5 g/dL (ref 6.0–8.3)

## 2013-03-31 LAB — CBC
Hemoglobin: 11.1 g/dL — ABNORMAL LOW (ref 13.0–17.0)
Platelets: 197 10*3/uL (ref 150–400)
RBC: 3.55 MIL/uL — ABNORMAL LOW (ref 4.22–5.81)

## 2013-03-31 LAB — PROTIME-INR
INR: 1.33 (ref 0.00–1.49)
Prothrombin Time: 16.2 seconds — ABNORMAL HIGH (ref 11.6–15.2)

## 2013-03-31 MED ORDER — FUROSEMIDE 40 MG PO TABS: 40.0000 mg | ORAL_TABLET | Freq: Every day | ORAL | Status: DC

## 2013-03-31 MED ORDER — ASPIRIN EC 81 MG PO TBEC
81.0000 mg | DELAYED_RELEASE_TABLET | Freq: Every day | ORAL | Status: DC
  Filled 2013-03-31: qty 1

## 2013-03-31 MED ORDER — WARFARIN SODIUM 5 MG PO TABS: 5.0000 mg | ORAL_TABLET | Freq: Every day | ORAL | Status: DC

## 2013-03-31 MED ORDER — POTASSIUM CHLORIDE CRYS ER 20 MEQ PO TBCR: 20.0000 meq | EXTENDED_RELEASE_TABLET | Freq: Every day | ORAL | Status: DC

## 2013-03-31 MED ORDER — METOPROLOL TARTRATE 12.5 MG HALF TABLET: 12.5000 mg | ORAL_TABLET | Freq: Two times a day (BID) | ORAL | Status: DC

## 2013-03-31 MED ORDER — OXYCODONE HCL 5 MG PO TABS: 5.0000 mg | ORAL_TABLET | ORAL | Status: DC | PRN

## 2013-03-31 MED ORDER — AMIODARONE HCL 200 MG PO TABS: 200.0000 mg | ORAL_TABLET | Freq: Two times a day (BID) | ORAL | Status: DC

## 2013-03-31 MED ORDER — ASPIRIN 81 MG PO TBEC: 81.0000 mg | DELAYED_RELEASE_TABLET | Freq: Every day | ORAL | Status: DC

## 2013-03-31 NOTE — Progress Notes (Signed)
CARDIAC REHAB PHASE I   PRE:  Rate/Rhythm: off monitor    BP: sitting     SaO2: 93 RA  MODE:  Ambulation: 350 ft   POST:  Rate/Rhythm: off monitor    BP: sitting      SaO2: 93 RA  Checked pts SaO2 on RA and accessed pt for RW for home. Fairly steady without RW but would like one for home to ensure security and endurance when in open spaces. Reviewed ed with sister present and answered questions.  [phone]   Jeffery Copeland CES, ACSM 03/31/2013 9:41 AM

## 2013-03-31 NOTE — Care Management Note (Signed)
    Page 1 of 2   03/31/2013     4:42:15 PM   CARE MANAGEMENT NOTE 03/31/2013  Patient:  Jeffery Copeland,Jeffery Copeland   Account Number:  [id number]  Date Initiated:  03/25/2013  Documentation initiated by:  Alvira Philips Assessment:   67 yr-old male adm s/p MVR; lives alone but has supportive sister who is retired Charity fundraiser     Action/Plan:   Anticipated DC Date:  03/30/2013   Anticipated DC Plan:  HOME W HOME HEALTH SERVICES      DC Planning Services  CM consult      Choice offered to / List presented to:     DME arranged  [facility] - [facility]      DME agency  [facility]        Status of service:  Completed, signed off Medicare Important Message given?   (If response is "NO", the following Medicare IM given date fields will be blank) Date Medicare IM given:   Date Additional Medicare IM given:    Discharge Disposition:  HOME/SELF CARE  Per UR Regulation:  Reviewed for med. necessity/level of care/duration of stay  If discussed at Long Length of Stay Meetings, dates discussed:    Comments:  Contact:  [place],Christine Sister [phone] PCP:  Dr Andi Devon  03/31/13 Teren Franckowiak,RN,BSN [phone] DC HOME TODAY WITH SIG OTHER.  NEEDS RW FOR HOME.  REFERRAL FAXED TO [facility] AT [phone].  RW TO BE DELIVERED TO [facility] AT [address], [address], [place], [place] [address].  03-26-13 2:50pm Avie Arenas, RNBSN [phone] States lives with significant other for 20 years.  Plan for dc home with SO.  CM will continue to follow for further needs.

## 2013-04-01 ENCOUNTER — Ambulatory Visit (INDEPENDENT_AMBULATORY_CARE_PROVIDER_SITE_OTHER): Payer: Medicare PPO | Admitting: *Deleted

## 2013-04-01 ENCOUNTER — Other Ambulatory Visit: Payer: Self-pay | Admitting: *Deleted

## 2013-04-01 DIAGNOSIS — Z7901 Long term (current) use of anticoagulants: Secondary | ICD-10-CM

## 2013-04-01 DIAGNOSIS — I4891 Unspecified atrial fibrillation: Secondary | ICD-10-CM

## 2013-04-01 LAB — POCT INR: INR: 1.6

## 2013-04-01 MED ORDER — AMIODARONE HCL 200 MG PO TABS: 200.0000 mg | ORAL_TABLET | Freq: Two times a day (BID) | ORAL | Status: DC

## 2013-04-02 ENCOUNTER — Other Ambulatory Visit: Payer: Self-pay | Admitting: *Deleted

## 2013-04-02 DIAGNOSIS — Z952 Presence of prosthetic heart valve: Secondary | ICD-10-CM

## 2013-04-02 DIAGNOSIS — I4891 Unspecified atrial fibrillation: Secondary | ICD-10-CM

## 2013-04-06 ENCOUNTER — Encounter: Payer: Self-pay | Admitting: Thoracic Surgery (Cardiothoracic Vascular Surgery)

## 2013-04-06 ENCOUNTER — Ambulatory Visit (INDEPENDENT_AMBULATORY_CARE_PROVIDER_SITE_OTHER): Payer: Commercial Managed Care - HMO | Admitting: Thoracic Surgery (Cardiothoracic Vascular Surgery)

## 2013-04-06 ENCOUNTER — Ambulatory Visit
Admission: RE | Admit: 2013-04-06 | Discharge: 2013-04-06 | Disposition: A | Discharge status: Home / Self Care | Payer: Commercial Managed Care - HMO | Source: Ambulatory Visit | Attending: Thoracic Surgery (Cardiothoracic Vascular Surgery) | Admitting: Thoracic Surgery (Cardiothoracic Vascular Surgery)

## 2013-04-06 VITALS — BP 133/70 | HR 55 | Resp 18 | Ht 69.0 in | Wt 168.0 lb

## 2013-04-06 DIAGNOSIS — T8209XA Other mechanical complication of heart valve prosthesis, initial encounter: Secondary | ICD-10-CM

## 2013-04-06 DIAGNOSIS — I4891 Unspecified atrial fibrillation: Secondary | ICD-10-CM

## 2013-04-06 DIAGNOSIS — Z952 Presence of prosthetic heart valve: Secondary | ICD-10-CM

## 2013-04-06 DIAGNOSIS — Z953 Presence of xenogenic heart valve: Secondary | ICD-10-CM

## 2013-04-06 DIAGNOSIS — I340 Nonrheumatic mitral (valve) insufficiency: Secondary | ICD-10-CM

## 2013-04-06 DIAGNOSIS — I97190 Other postprocedural cardiac functional disturbances following cardiac surgery: Secondary | ICD-10-CM

## 2013-04-06 DIAGNOSIS — I059 Rheumatic mitral valve disease, unspecified: Secondary | ICD-10-CM

## 2013-04-06 DIAGNOSIS — Z954 Presence of other heart-valve replacement: Secondary | ICD-10-CM

## 2013-04-06 NOTE — Patient Instructions (Signed)
The patient may continue to gradually increase their physical activity as tolerated.  They should refrain from any heavy lifting or strenuous use of their arms and shoulders until at least 8 weeks from the time of their surgery, and they should avoid activities that cause increased pain in their chest on the side of their surgical incision.  Otherwise they may continue to increase their activities without any particular limitations.  The patient may return to driving an automobile in 2 weeks as long as they are no longer requiring oral narcotic pain relievers during the daytime.  It would be wise to start driving only short distances during the daylight and gradually increase from there as they feel comfortable.  The patient is encouraged to enroll and participate in the outpatient cardiac rehab program beginning as soon as practical.

## 2013-04-06 NOTE — Progress Notes (Signed)
[address]       [place] [address]             [phone]     CARDIOTHORACIC SURGERY OFFICE NOTE  Referring Provider is Marca Ancona, MD PCP is Alva Garnet., MD   HPI:  Patient returns for routine followup status post minimally invasive redo mitral valve replacement using a bioprosthetic tissue valve on 03/24/2013. His postoperative hospital course was notable for the development of postoperative rate controlled atrial fibrillation, but otherwise his recovery has been remarkably uncomplicated.  Since hospital discharge she has been staying locally with his sister. He has continued to do remarkably well. He states that he is walking at least 3 times a day. He still walks slowly and uses either a cane or a walker to help steady himself. He has not had any dizzy spells and he reports that his getting around pretty well on his own. He notes that his breathing is considerably better than it was prior to surgery. He still has some soreness in his chest and he is using pain relievers intermittently. This seems to be gradually improving. He is eating reasonably well. Overall he has no complaints. His prothrombin time was checked last week and his Coumadin dose adjusted. He is scheduled to have it retested later this week and he goes back to see Dr. Shirlee Latch sometime later this month. Overall he is pleased with his progress.   Current Outpatient Prescriptions  Medication Sig Dispense Refill  . amiodarone (PACERONE) 200 MG tablet Take 1 tablet (200 mg total) by mouth 2 (two) times daily.  60 tablet  1  . aspirin EC 81 MG EC tablet Take 1 tablet (81 mg total) by mouth daily.      . Cholecalciferol (VITAMIN D3) 1000 UNITS CAPS Take 1 capsule by mouth daily.       Marland Kitchen ezetimibe (ZETIA) 10 MG tablet Take 1 tablet (10 mg total) by mouth daily.  30 tablet  6  . furosemide (LASIX) 40 MG tablet Take 1 tablet (40 mg total) by mouth daily.  30 tablet  1  .  HYDROcodone-acetaminophen (NORCO) 10-325 MG per tablet 1 tablet every 6 (six) hours as needed.       . metoprolol tartrate (LOPRESSOR) 12.5 mg TABS tablet Take 0.5 tablets (12.5 mg total) by mouth 2 (two) times daily.  60 each  1  . omeprazole (PRILOSEC) 20 MG capsule Take 20 mg by mouth daily.       . potassium chloride SA (K-DUR,KLOR-CON) 20 MEQ tablet Take 1 tablet (20 mEq total) by mouth daily.  30 tablet  1  . ramipril (ALTACE) 5 MG capsule Take 1 capsule (5 mg total) by mouth daily.  30 capsule  6  . tiotropium (SPIRIVA) 18 MCG inhalation capsule Place 18 mcg into inhaler and inhale daily.      Marland Kitchen warfarin (COUMADIN) 5 MG tablet Take 1 tablet (5 mg total) by mouth daily at 6 PM. As directed by coumadin clinic  100 tablet  1  . zolpidem (AMBIEN) 5 MG tablet Take 5 mg by mouth at bedtime as needed. For insomnia.       No current facility-administered medications for this visit.      Physical Exam:   BP 133/70  Pulse 55  Resp 18  Ht 5\' 9"  (1.753 m)  Wt 168 lb (76.204 kg)  BMI 24.8 kg/m2  SpO2 98%  General:  Well appearing  Chest:  Clear to auscultation  CV:   Regular rate and rhythm without murmur  Incisions:  Clean and dry and healing nicely, chest tube sutures are removed  Abdomen:  Soft nontender  Extremities:  Warm and well-perfused with no lower extremity edema  Diagnostic Tests:  *RADIOLOGY REPORT*   Clinical Data: Follow-up mitral valve surgery.  Shortness of breath.   CHEST - 2 VIEW   Comparison: 03/31/2013.   Findings: Trachea is midline, given slight patient rotation. Sternotomy wires are unchanged in position.  Heart size stable. There is volume loss in the left hemithorax with a tiny amount of left pleural fluid or pleural thickening, stable.  Tiny right pleural effusion.  Probable scarring at the right lung base.   Multiple thoracic compression fractures are again noted.   IMPRESSION:   1.  Volume loss and pleural parenchymal scarring or tiny  left pleural effusion in the left hemithorax, stable. 2.  Tiny right pleural effusion with probable right basilar scarring.     Original Report Authenticated By: Leanna Battles, M.D.    Impression:  Patient is doing remarkably well just 2 weeks following minimally invasive redo mitral valve replacement.  Plan:  I've encouraged patient to continue to gradually increase his physical activity as tolerated although I for monitoring to refrain from any heavy lifting or strenuous use of his arms or shoulders. I think he can start that palpation cardiac rehabilitation program at any time. At this point I think he can manage on his own at home although he was still needs some assistance with meal preparation and shopping for food. As possible within a couple of weeks he could be driving an automobile as long as the soreness in his chest continues to resolve such that he can stop using pain relievers during the daytime. We have not made any changes in his current medications. We'll plan to see him back in 4 weeks to make sure that he continues to recover uneventfully.   Salvatore Decent. Cornelius Moras, MD 04/06/2013 2:59 PM

## 2013-04-08 ENCOUNTER — Ambulatory Visit (INDEPENDENT_AMBULATORY_CARE_PROVIDER_SITE_OTHER): Payer: Medicare PPO | Admitting: *Deleted

## 2013-04-08 DIAGNOSIS — I4891 Unspecified atrial fibrillation: Secondary | ICD-10-CM

## 2013-04-08 DIAGNOSIS — Z7901 Long term (current) use of anticoagulants: Secondary | ICD-10-CM

## 2013-04-08 LAB — POCT INR: INR: 5.6

## 2013-04-15 ENCOUNTER — Ambulatory Visit (INDEPENDENT_AMBULATORY_CARE_PROVIDER_SITE_OTHER): Payer: Commercial Managed Care - HMO | Admitting: Nurse Practitioner

## 2013-04-15 ENCOUNTER — Ambulatory Visit (INDEPENDENT_AMBULATORY_CARE_PROVIDER_SITE_OTHER): Payer: Medicare PPO | Admitting: Pharmacist

## 2013-04-15 ENCOUNTER — Encounter: Payer: Self-pay | Admitting: Nurse Practitioner

## 2013-04-15 VITALS — BP 150/90 | HR 56 | Ht 69.0 in | Wt 165.0 lb

## 2013-04-15 DIAGNOSIS — Z954 Presence of other heart-valve replacement: Secondary | ICD-10-CM

## 2013-04-15 DIAGNOSIS — I4891 Unspecified atrial fibrillation: Secondary | ICD-10-CM

## 2013-04-15 DIAGNOSIS — Z9889 Other specified postprocedural states: Secondary | ICD-10-CM

## 2013-04-15 DIAGNOSIS — Z7901 Long term (current) use of anticoagulants: Secondary | ICD-10-CM

## 2013-04-15 DIAGNOSIS — Z952 Presence of prosthetic heart valve: Secondary | ICD-10-CM

## 2013-04-15 LAB — CBC WITH DIFFERENTIAL/PLATELET
Basophils Absolute: 0.1 10*3/uL (ref 0.0–0.1)
Basophils Relative: 0.7 % (ref 0.0–3.0)
Eosinophils Absolute: 0.2 10*3/uL (ref 0.0–0.7)
Eosinophils Relative: 2.1 % (ref 0.0–5.0)
HCT: 33.8 % — ABNORMAL LOW (ref 39.0–52.0)
Hemoglobin: 11.4 g/dL — ABNORMAL LOW (ref 13.0–17.0)
Lymphocytes Relative: 10.6 % — ABNORMAL LOW (ref 12.0–46.0)
Lymphs Abs: 0.8 10*3/uL (ref 0.7–4.0)
MCHC: 33.7 g/dL (ref 30.0–36.0)
MCV: 92.4 fl (ref 78.0–100.0)
Monocytes Absolute: 1.1 10*3/uL — ABNORMAL HIGH (ref 0.1–1.0)
Monocytes Relative: 15.2 % — ABNORMAL HIGH (ref 3.0–12.0)
Neutro Abs: 5.2 10*3/uL (ref 1.4–7.7)
Neutrophils Relative %: 71.4 % (ref 43.0–77.0)
Platelets: 298 10*3/uL (ref 150.0–400.0)
RBC: 3.66 Mil/uL — ABNORMAL LOW (ref 4.22–5.81)
RDW: 14.5 % (ref 11.5–14.6)
WBC: 7.3 10*3/uL (ref 4.5–10.5)

## 2013-04-15 LAB — BASIC METABOLIC PANEL
BUN: 22 mg/dL (ref 6–23)
CO2: 28 mEq/L (ref 19–32)
Calcium: 8.9 mg/dL (ref 8.4–10.5)
Chloride: 101 mEq/L (ref 96–112)
Creatinine, Ser: 1.5 mg/dL (ref 0.4–1.5)
GFR: 50.76 mL/min — ABNORMAL LOW (ref >60.00)
Glucose, Bld: 91 mg/dL (ref 70–99)
Potassium: 4.4 mEq/L (ref 3.5–5.1)
Sodium: 135 mEq/L (ref 135–145)

## 2013-04-15 LAB — POCT INR: INR: 2.2

## 2013-04-15 MED ORDER — AMIODARONE HCL 200 MG PO TABS: 200.0000 mg | ORAL_TABLET | Freq: Every day | ORAL | Status: DC

## 2013-04-15 MED ORDER — FUROSEMIDE 40 MG PO TABS: 20.0000 mg | ORAL_TABLET | Freq: Every day | ORAL | Status: DC

## 2013-04-15 NOTE — Progress Notes (Signed)
Jeffery Copeland Date of Birth: [birth year] Medical Record #[id number]  History of Present Illness: Mr. Redmon is seen back today for a post hospital visit. Seen for Dr. Shirlee Latch. Has had remote MVR with a porcine bioprosthetic tissue valve back in 1999 for complicated bacterial endocarditis. 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 A tisue valve was chosed due to troubles with alcohol abuse and seizures. Was previously followed by Dr. Riley Kill for persistent atrial fib on chronic coumadin. Over the last 2 years, developed progressive DOE with prominent murmur and had an echo in February of this year not showing much MR. Seen by Dr. 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 No coronary disease on cath - referred to Dr. Cornelius Moras for surgical intervention - had minimally invasive redo MVR with a 29mm edwards Magna pericardial valve on August 26th. Post op course he remained in atrial fib - he is on amiodarone. He did require a blood transfusion due to blood loss anemia. Had aggressive pulmonary toilet. Discharged on 03/31/13.   His other issues are as noted below and include past CVA, GERD, OA, HTN, COPD, lung nodules with past lobectomy, atrial fib, HTN, epidural hematoma back in 2012, and iron deficiency anemia. He has chronic DOE - followed by Dr. Delford Field.   Comes back today. Here alone. Has been home about 2 1/2 weeks - now back at home alone. Doing ok. Still with chest soreness. Still with DOE - but seems unchanged. Not dizzy or lightheaded. Walking as much as he can. He is planning on attending cardiac rehab and says they have already called him to start. No palpitations. Does not like taking 40 mg of Lasix due to going to  the bathroom too much - wants to cut back to 20mg .   Current Outpatient Prescriptions  Medication Sig Dispense Refill  . amiodarone (PACERONE) 200 MG tablet Take 1 tablet (200 mg total) by mouth 2 (two) times daily.  60 tablet  1  . aspirin EC 81 MG EC tablet Take 1 tablet (81 mg total) by mouth daily.      . Cholecalciferol (VITAMIN D3) 1000 UNITS CAPS Take 1 capsule by mouth daily.       Marland Kitchen ezetimibe (ZETIA) 10 MG tablet Take 1 tablet (10 mg total) by mouth daily.  30 tablet  6  . furosemide (LASIX) 40 MG tablet Take 1 tablet (40 mg total) by mouth daily.  30 tablet  1  . HYDROcodone-acetaminophen (NORCO) 10-325 MG per tablet 1 tablet every 6 (six) hours as needed.       . metoprolol tartrate (LOPRESSOR) 12.5 mg TABS tablet Take 0.5 tablets (12.5 mg total) by mouth 2 (two) times daily.  60 each  1  . omeprazole (PRILOSEC) 20 MG capsule Take 20 mg by mouth daily.       . potassium chloride SA (K-DUR,KLOR-CON) 20 MEQ tablet Take 1 tablet (20 mEq total) by mouth daily.  30 tablet  1  . ramipril (ALTACE) 5 MG capsule Take 1 capsule (5 mg total) by mouth daily.  30 capsule  6  . tiotropium (SPIRIVA) 18 MCG inhalation capsule Place 18 mcg into inhaler and inhale daily.      Marland Kitchen warfarin (COUMADIN)  5 MG tablet Take 1 tablet (5 mg total) by mouth daily at 6 PM. As directed by coumadin clinic  100 tablet  1  . zolpidem (AMBIEN) 5 MG tablet Take 5 mg by mouth at bedtime as needed. For insomnia.       No current facility-administered medications for this visit.    Allergies  Allergen Reactions  . Rosuvastatin Other (See Comments)    Muscle aches    Past Medical History  Diagnosis Date  . 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 Cerebrovascular disease, unspecified   . Esophageal reflux   . Generalized osteoarthrosis, unspecified site   . Hypertension   . COPD (chronic obstructive pulmonary disease) 1999  . Lung nodules 2008, 2014    LLL nodule removed 2008  with LLL lobectomy,  RLL nodule 3mm observation  . Atrial fibrillation 11/11/2008    Qualifier: Diagnosis of  By: Riley Kill, MD, Johny Sax   . CARDIOMYOPATHY 10/20/2008    Qualifier: Diagnosis of  By: Manson Passey, RN, BSN, Lauren    . ISCHEMIC COLITIS, HX OF 10/20/2008    Qualifier: Diagnosis of  By: Manson Passey, RN, BSN, Lauren    . 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 HYPERTENSION, BENIGN 10/20/2008    Qualifier: Diagnosis of  By: Manson Passey, RN, BSN, Lauren    . Epidural hematoma 03/01/2011    Recent fall 2012   . Severe mitral regurgitation 02/20/2013  . Iron deficiency anemia, unspecified   . S/P mitral valve replacement with bioprosthetic valve 03/24/2013    Redo mitral valve replacement using 29mm Edwards Magna mitral bovine bioprosthetic tissue valve performed via right mini thoracotomy    Past Surgical History  Procedure Laterality Date  . Mitral valve replacement  02/01/1998    Carpentier-Edwards porcine bioprosthetic tissue valve, size 31mm, placed for complicated bacterial endocarditis  . Video assisted thoracoscopy  04/29/2007    Left VATS w/ mini thoracotomy for Left Lower Lobectomy for benign lung nodules  . Tee without cardioversion N/A 02/12/2013    Procedure: TRANSESOPHAGEAL ECHOCARDIOGRAM (TEE);  Surgeon: Pricilla Riffle, MD;  Location: [facility] ENDOSCOPY;  Service: Cardiovascular;  Laterality: N/A;  . Mitral valve replacement Right 03/24/2013    Procedure: MINIMALLY INVASIVE REDO MITRAL VALVE (MV) REPLACEMENT;  Surgeon: Purcell Nails, MD;  Location: [facility] OR;  Service: Open Heart Surgery;  Laterality: Right;  . Minimally invasive maze procedure N/A 03/24/2013    Procedure: MINIMALLY INVASIVE MAZE PROCEDURE;  Surgeon: Purcell Nails, MD;  Location: [facility] OR;  Service: Open Heart Surgery;  Laterality: N/A;  .  Intraoperative transesophageal echocardiogram N/A 03/24/2013    Procedure: INTRAOPERATIVE TRANSESOPHAGEAL ECHOCARDIOGRAM;  Surgeon: Purcell Nails, MD;  Location: [facility] OR;  Service: Open Heart Surgery;  Laterality: N/A;    History  Smoking status  . Former Smoker -- 2.00 packs/day for 40 years  . Types: Cigarettes  . Quit date: 07/30/2004  Smokeless tobacco  . Never Used    History  Alcohol Use  . Yes    History reviewed. No pertinent family history.  Review of Systems: The review of systems is per the HPI.  All other systems were reviewed and are negative.  Physical Exam: BP 150/90  Pulse 56  Ht 5\' 9"  (1.753 m)  Wt 165 lb (74.844 kg)  BMI 24.36 kg/m2  SpO2 96% Recheck of his BP by me is 140/80.  Patient is very pleasant and in no acute distress. He is quite hard of hearing. Skin is warm and dry. Color is normal.  HEENT is unremarkable. Normocephalic/atraumatic. PERRL. Sclera are nonicteric. Neck is supple. No masses. No JVD. Lungs are clear. Cardiac exam shows an irregular rhythm. Rate a little slow. Right sided chest incisions look ok. Abdomen is soft. Extremities are without edema. Gait and ROM are intact. No gross neurologic deficits noted.  LABORATORY DATA: EKG today shows atrial fib with a slow ventricular response - rate of 55.   Labs are pending for today.   Lab Results  Component Value Date   WBC 8.3 03/31/2013   HGB 11.1* 03/31/2013   HCT 32.6* 03/31/2013   PLT 197 03/31/2013   GLUCOSE 116* 03/31/2013   ALT 20 03/31/2013   AST 17 03/31/2013   NA 135 03/31/2013   K 4.6 03/31/2013   CL 96 03/31/2013   CREATININE 1.19 03/31/2013   BUN 14 03/31/2013   CO2 31 03/31/2013   TSH 1.56 01/04/2009   INR 2.2 04/15/2013   HGBA1C 5.4 03/20/2013     Assessment / Plan: 1. S/P redo minimally invasive MVR - doing well clinically. Will plan on checking a repeat echo in a month. He would like to cut his Lasix back to just 20 mg a day and I think this will be fine - this was his pre op dose.   2.  HTN - recheck of his BP was improved - will follow for now and not change his regimen.   3. Atrial fib - looks like this is chronic - he is on anticoagulation and also on amiodarone - not sure what the long term plan is in regards to his atrial fib - rate is slow and I have cut the amiodarone back to just 200 mg per day.   We will check follow up labs today. Plan for echo in a month. See him back after his echo on a day that Dr. Shirlee Latch is here in the office.   Patient is agreeable to this plan and will call if any problems develop in the interim.   Rosalio Macadamia, RN, ANP-C [facility] [address] [place], [place]  [address]

## 2013-04-15 NOTE — Patient Instructions (Addendum)
Stay on your current medicines BUT I am decreasing the amiodarone to just one pill per day and cutting the Lasix back to just 20 mg a day  We need to check labs today  We will need to do an ultrasound of your heart in about a month  See me and Dr. Shirlee Latch in a month  Call the [facility] office at [phone] if you have any questions, problems or concerns.

## 2013-04-17 ENCOUNTER — Encounter (HOSPITAL_COMMUNITY): Payer: Medicare PPO

## 2013-04-19 NOTE — Progress Notes (Addendum)
Stop amiodarone, continue metoprolol.  He is in chronic atrial fibrillation so amiodarone is not helpful.

## 2013-04-20 ENCOUNTER — Encounter (HOSPITAL_COMMUNITY): Payer: Medicare PPO

## 2013-04-20 ENCOUNTER — Telehealth: Payer: Self-pay | Admitting: *Deleted

## 2013-04-20 NOTE — Telephone Encounter (Signed)
Message copied by Debbe Bales on Mon Apr 20, 2013  7:54 AM ------      Message from: Jeffery Copeland      Created: Mon Apr 20, 2013  7:45 AM       Can you call him and let him know that Dr.McLean would like for him to stop his amiodarone and stay on all of his other medicines.            lori ------

## 2013-04-20 NOTE — Telephone Encounter (Signed)
S/w pt stated verbal understanding to stop amiodarone per Dr. Shirlee Latch and stay on all other medications

## 2013-04-22 ENCOUNTER — Encounter (HOSPITAL_COMMUNITY): Payer: Medicare PPO

## 2013-04-23 ENCOUNTER — Encounter (HOSPITAL_COMMUNITY)
Admission: RE | Admit: 2013-04-23 | Discharge: 2013-04-23 | Disposition: A | Discharge status: Home / Self Care | Payer: Medicare PPO | Source: Ambulatory Visit | Attending: Cardiology | Admitting: Cardiology

## 2013-04-23 DIAGNOSIS — I2789 Other specified pulmonary heart diseases: Secondary | ICD-10-CM | POA: Insufficient documentation

## 2013-04-23 DIAGNOSIS — I4891 Unspecified atrial fibrillation: Secondary | ICD-10-CM | POA: Insufficient documentation

## 2013-04-23 DIAGNOSIS — Z952 Presence of prosthetic heart valve: Secondary | ICD-10-CM | POA: Insufficient documentation

## 2013-04-23 DIAGNOSIS — I509 Heart failure, unspecified: Secondary | ICD-10-CM | POA: Insufficient documentation

## 2013-04-23 DIAGNOSIS — Z7901 Long term (current) use of anticoagulants: Secondary | ICD-10-CM | POA: Insufficient documentation

## 2013-04-23 DIAGNOSIS — Z5189 Encounter for other specified aftercare: Secondary | ICD-10-CM | POA: Insufficient documentation

## 2013-04-23 DIAGNOSIS — I059 Rheumatic mitral valve disease, unspecified: Secondary | ICD-10-CM | POA: Insufficient documentation

## 2013-04-23 NOTE — Progress Notes (Signed)
Cardiac Rehab Medication Review by a Pharmacist  Does the patient  feel that his/her medications are working for him/her?  yes  Has the patient been experiencing any side effects to the medications prescribed?  no  Does the patient measure his/her own blood pressure or blood glucose at home?  no   Does the patient have any problems obtaining medications due to transportation or finances?   no  Understanding of regimen: good Understanding of indications: good Potential of compliance: good  Pharmacist comments: Patient has a good understanding of his medications. He had his medication bottles with him today, and reported he no longer takes the amiodarone. He also reported his warfarin dose has been changed several times. He does not take his BP at home daily, but his sister comes occasionally to take his BP at home.   Christiane Ha A. Lenon Ahmadi, PharmD Clinical Pharmacist - Resident Pager: [phone] Pharmacy: [phone] 04/23/2013 9:38 AM

## 2013-04-24 ENCOUNTER — Encounter (HOSPITAL_COMMUNITY): Payer: Medicare PPO

## 2013-04-27 ENCOUNTER — Encounter (HOSPITAL_COMMUNITY): Payer: Self-pay

## 2013-04-27 ENCOUNTER — Encounter (HOSPITAL_COMMUNITY)
Admission: RE | Admit: 2013-04-27 | Discharge: 2013-04-27 | Disposition: A | Discharge status: Home / Self Care | Payer: Medicare PPO | Source: Ambulatory Visit | Attending: Cardiology | Admitting: Cardiology

## 2013-04-27 NOTE — Addendum Note (Signed)
Encounter addended by: Robyne Peers, RN on: 04/27/2013 12:14 PM<BR>     Documentation filed: Notes Section

## 2013-04-27 NOTE — Progress Notes (Signed)
Pt started cardiac rehab today.  Pt tolerated light exercise without difficulty.  VSS, telemetry-atrial fibrillation. Asymptomatic.  Pt oriented to exercise equipment and routine.  Understanding verbalized.

## 2013-04-27 NOTE — Addendum Note (Signed)
Encounter addended by: Robyne Peers, RN on: 04/27/2013 12:11 PM<BR>     Documentation filed: Notes Section
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
## 2013-04-29 ENCOUNTER — Ambulatory Visit (INDEPENDENT_AMBULATORY_CARE_PROVIDER_SITE_OTHER): Payer: Medicare PPO | Admitting: *Deleted

## 2013-04-29 ENCOUNTER — Encounter (HOSPITAL_COMMUNITY): Payer: Medicare PPO

## 2013-04-29 DIAGNOSIS — I059 Rheumatic mitral valve disease, unspecified: Secondary | ICD-10-CM

## 2013-04-29 DIAGNOSIS — Z7901 Long term (current) use of anticoagulants: Secondary | ICD-10-CM

## 2013-04-29 DIAGNOSIS — Z954 Presence of other heart-valve replacement: Secondary | ICD-10-CM

## 2013-04-29 DIAGNOSIS — Z952 Presence of prosthetic heart valve: Secondary | ICD-10-CM

## 2013-04-29 DIAGNOSIS — I4891 Unspecified atrial fibrillation: Secondary | ICD-10-CM

## 2013-05-01 ENCOUNTER — Encounter (HOSPITAL_COMMUNITY)
Admission: RE | Admit: 2013-05-01 | Discharge: 2013-05-01 | Disposition: A | Discharge status: Home / Self Care | Payer: Medicare PPO | Source: Ambulatory Visit | Attending: Cardiology | Admitting: Cardiology

## 2013-05-01 DIAGNOSIS — I4891 Unspecified atrial fibrillation: Secondary | ICD-10-CM | POA: Insufficient documentation

## 2013-05-01 DIAGNOSIS — Z5189 Encounter for other specified aftercare: Secondary | ICD-10-CM | POA: Insufficient documentation

## 2013-05-01 DIAGNOSIS — Z952 Presence of prosthetic heart valve: Secondary | ICD-10-CM | POA: Insufficient documentation

## 2013-05-01 DIAGNOSIS — I2789 Other specified pulmonary heart diseases: Secondary | ICD-10-CM | POA: Insufficient documentation

## 2013-05-01 DIAGNOSIS — Z7901 Long term (current) use of anticoagulants: Secondary | ICD-10-CM | POA: Insufficient documentation

## 2013-05-01 DIAGNOSIS — I509 Heart failure, unspecified: Secondary | ICD-10-CM | POA: Insufficient documentation

## 2013-05-01 DIAGNOSIS — I059 Rheumatic mitral valve disease, unspecified: Secondary | ICD-10-CM | POA: Insufficient documentation

## 2013-05-04 ENCOUNTER — Ambulatory Visit (HOSPITAL_COMMUNITY): Payer: Medicare PPO | Attending: Cardiology | Admitting: Radiology

## 2013-05-04 ENCOUNTER — Other Ambulatory Visit (HOSPITAL_COMMUNITY): Payer: Self-pay | Admitting: Cardiology

## 2013-05-04 ENCOUNTER — Encounter (HOSPITAL_COMMUNITY): Payer: Medicare PPO

## 2013-05-04 DIAGNOSIS — J4489 Other specified chronic obstructive pulmonary disease: Secondary | ICD-10-CM | POA: Insufficient documentation

## 2013-05-04 DIAGNOSIS — Z954 Presence of other heart-valve replacement: Secondary | ICD-10-CM | POA: Diagnosis not present

## 2013-05-04 DIAGNOSIS — I428 Other cardiomyopathies: Secondary | ICD-10-CM | POA: Insufficient documentation

## 2013-05-04 DIAGNOSIS — J449 Chronic obstructive pulmonary disease, unspecified: Secondary | ICD-10-CM | POA: Insufficient documentation

## 2013-05-04 DIAGNOSIS — I079 Rheumatic tricuspid valve disease, unspecified: Secondary | ICD-10-CM | POA: Diagnosis not present

## 2013-05-04 DIAGNOSIS — Z87891 Personal history of nicotine dependence: Secondary | ICD-10-CM | POA: Insufficient documentation

## 2013-05-04 DIAGNOSIS — I10 Essential (primary) hypertension: Secondary | ICD-10-CM | POA: Diagnosis not present

## 2013-05-04 DIAGNOSIS — I4891 Unspecified atrial fibrillation: Secondary | ICD-10-CM | POA: Insufficient documentation

## 2013-05-04 DIAGNOSIS — I4892 Unspecified atrial flutter: Secondary | ICD-10-CM | POA: Diagnosis not present

## 2013-05-04 DIAGNOSIS — I059 Rheumatic mitral valve disease, unspecified: Secondary | ICD-10-CM

## 2013-05-04 DIAGNOSIS — I6529 Occlusion and stenosis of unspecified carotid artery: Secondary | ICD-10-CM | POA: Diagnosis not present

## 2013-05-04 NOTE — Progress Notes (Signed)
Echocardiogram performed.  

## 2013-05-06 ENCOUNTER — Encounter (HOSPITAL_COMMUNITY)
Admission: RE | Admit: 2013-05-06 | Discharge: 2013-05-06 | Disposition: A | Discharge status: Home / Self Care | Payer: Medicare PPO | Source: Ambulatory Visit | Attending: Cardiology | Admitting: Cardiology

## 2013-05-06 DIAGNOSIS — Z5189 Encounter for other specified aftercare: Secondary | ICD-10-CM | POA: Diagnosis not present

## 2013-05-07 ENCOUNTER — Other Ambulatory Visit: Payer: Self-pay | Admitting: *Deleted

## 2013-05-07 DIAGNOSIS — I340 Nonrheumatic mitral (valve) insufficiency: Secondary | ICD-10-CM

## 2013-05-08 ENCOUNTER — Encounter (HOSPITAL_COMMUNITY)
Admission: RE | Admit: 2013-05-08 | Discharge: 2013-05-08 | Disposition: A | Discharge status: Home / Self Care | Payer: Medicare PPO | Source: Ambulatory Visit | Attending: Cardiology | Admitting: Cardiology

## 2013-05-08 DIAGNOSIS — Z5189 Encounter for other specified aftercare: Secondary | ICD-10-CM | POA: Diagnosis not present

## 2013-05-08 NOTE — Progress Notes (Signed)
Reviewed home exercise with pt today.  Pt plans to walk for 15 minutes, 2 times/day until he can increase his time to a full 30 minutes consecutively.  He will do his exercise 2-4 days per week outside of the program for exercise.  Reviewed THR, pulse, RPE, sign and symptoms and when to call 911 or MD.  Pt was noncompliant while reviewing his home exercise guidelines.  He was very dismissive and quickly informed me that "nothing is wrong with his heart."  I reviewed with pt why he is in the program.   Alexia Freestone, MS, ACSM RCEP 9:39 AM 05/08/2013

## 2013-05-11 ENCOUNTER — Encounter: Payer: Self-pay | Admitting: Thoracic Surgery (Cardiothoracic Vascular Surgery)

## 2013-05-11 ENCOUNTER — Encounter (HOSPITAL_COMMUNITY): Payer: Medicare PPO

## 2013-05-11 ENCOUNTER — Ambulatory Visit (INDEPENDENT_AMBULATORY_CARE_PROVIDER_SITE_OTHER): Payer: Commercial Managed Care - HMO | Admitting: Thoracic Surgery (Cardiothoracic Vascular Surgery)

## 2013-05-11 ENCOUNTER — Encounter: Payer: Self-pay | Admitting: Nurse Practitioner

## 2013-05-11 ENCOUNTER — Ambulatory Visit (INDEPENDENT_AMBULATORY_CARE_PROVIDER_SITE_OTHER): Payer: Medicare HMO | Admitting: *Deleted

## 2013-05-11 ENCOUNTER — Ambulatory Visit (INDEPENDENT_AMBULATORY_CARE_PROVIDER_SITE_OTHER): Payer: Medicare PPO | Admitting: Nurse Practitioner

## 2013-05-11 VITALS — BP 90/55 | HR 58 | Resp 16 | Ht 65.25 in | Wt 162.0 lb

## 2013-05-11 VITALS — BP 130/64 | HR 60 | Ht 65.25 in | Wt 162.0 lb

## 2013-05-11 DIAGNOSIS — I4891 Unspecified atrial fibrillation: Secondary | ICD-10-CM

## 2013-05-11 DIAGNOSIS — I059 Rheumatic mitral valve disease, unspecified: Secondary | ICD-10-CM

## 2013-05-11 DIAGNOSIS — Z954 Presence of other heart-valve replacement: Secondary | ICD-10-CM

## 2013-05-11 DIAGNOSIS — Z953 Presence of xenogenic heart valve: Secondary | ICD-10-CM

## 2013-05-11 DIAGNOSIS — Z952 Presence of prosthetic heart valve: Secondary | ICD-10-CM

## 2013-05-11 DIAGNOSIS — Z7901 Long term (current) use of anticoagulants: Secondary | ICD-10-CM

## 2013-05-11 DIAGNOSIS — I340 Nonrheumatic mitral (valve) insufficiency: Secondary | ICD-10-CM

## 2013-05-11 DIAGNOSIS — Z8679 Personal history of other diseases of the circulatory system: Secondary | ICD-10-CM

## 2013-05-11 DIAGNOSIS — Z9889 Other specified postprocedural states: Secondary | ICD-10-CM

## 2013-05-11 LAB — CBC WITH DIFFERENTIAL/PLATELET
Basophils Absolute: 0 10*3/uL (ref 0.0–0.1)
Basophils Relative: 0.1 % (ref 0.0–3.0)
Eosinophils Absolute: 0.3 10*3/uL (ref 0.0–0.7)
Eosinophils Relative: 2.9 % (ref 0.0–5.0)
HCT: 34.4 % — ABNORMAL LOW (ref 39.0–52.0)
Hemoglobin: 11.4 g/dL — ABNORMAL LOW (ref 13.0–17.0)
Lymphocytes Relative: 7.6 % — ABNORMAL LOW (ref 12.0–46.0)
Lymphs Abs: 0.7 10*3/uL (ref 0.7–4.0)
MCHC: 33.1 g/dL (ref 30.0–36.0)
MCV: 91.8 fl (ref 78.0–100.0)
Monocytes Absolute: 1 10*3/uL (ref 0.1–1.0)
Monocytes Relative: 10.3 % (ref 3.0–12.0)
Neutro Abs: 7.5 10*3/uL (ref 1.4–7.7)
Neutrophils Relative %: 79.1 % — ABNORMAL HIGH (ref 43.0–77.0)
Platelets: 257 10*3/uL (ref 150.0–400.0)
RBC: 3.75 Mil/uL — ABNORMAL LOW (ref 4.22–5.81)
RDW: 14.7 % — ABNORMAL HIGH (ref 11.5–14.6)
WBC: 9.5 10*3/uL (ref 4.5–10.5)

## 2013-05-11 LAB — BASIC METABOLIC PANEL
BUN: 16 mg/dL (ref 6–23)
CO2: 27 mEq/L (ref 19–32)
Calcium: 9 mg/dL (ref 8.4–10.5)
Chloride: 102 mEq/L (ref 96–112)
Creatinine, Ser: 1.3 mg/dL (ref 0.4–1.5)
GFR: 61.18 mL/min (ref >60.00)
Glucose, Bld: 86 mg/dL (ref 70–99)
Potassium: 4.5 mEq/L (ref 3.5–5.1)
Sodium: 137 mEq/L (ref 135–145)

## 2013-05-11 LAB — PROTIME-INR
INR: 7.2 ratio (ref 0.8–1.0)
Prothrombin Time: 72.9 s (ref 10.2–12.4)

## 2013-05-11 NOTE — Patient Instructions (Signed)
The patient may continue to gradually increase their physical activity as tolerated.  They should refrain from any heavy lifting or strenuous use of their arms and shoulders until at least 8 weeks from the time of their surgery, and they should avoid activities that cause increased pain in their chest on the side of their surgical incision.  Otherwise they may continue to increase their activities without any particular limitations.  

## 2013-05-11 NOTE — Patient Instructions (Signed)
Stay on your current medicines  Be as active as you can - but safe!! Use your cane.  We need to check labs today  See Dr. Shirlee Latch in 2 to 3 months  Call the [facility] office at [phone] if you have any questions, problems or concerns.

## 2013-05-11 NOTE — Progress Notes (Signed)
Jeffery Copeland Date of Birth: 1946-04-26 Medical Record #[id number]  History of Present Illness: [name] is seen back today for a one month visit. Seen for Dr. Shirlee Latch. Has had remote MVR with a porcine bioprosthetic tissue valve back in 1999 for complicated bacterial endocarditis. 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 A tisue valve was chosed due to troubles with alcohol abuse and seizures. Was previously followed by Dr. Riley Kill for persistent atrial fib on chronic coumadin. Over the last 2 years, developed progressive DOE with prominent murmur and had an echo in February of this year not showing much MR. Seen by Dr. 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 No coronary disease on cath - referred to Dr. Cornelius Moras for surgical intervention - had minimally invasive redo MVR with a 29mm edwards Magna pericardial valve on August 26th. Post op course he remained in atrial fib - he was on amiodarone. He did require a blood transfusion due to blood loss anemia. Had aggressive pulmonary toilet. Discharged on 03/31/13.   His other issues are as noted below and include past CVA, GERD, OA, HTN, COPD, lung nodules with past lobectomy, atrial fib, HTN, epidural hematoma back in 2012, and iron deficiency anemia. He has chronic DOE - followed by Dr. Delford Field.   I saw him a month ago - he remained in atrial fib - we stopped his amiodarone (no plans to cardiovert since this is his chronic rhythm). Lasix was cut back.   Comes back today. Here alone. INR is over 7 today from the coumadin clinic. He is doing ok. Has some back and neck issues - has a crick in his neck. Not dizzy or lightheaded. No chest pain. Dyspnea seems to be at his  baseline. Echo looked ok - normal prosthetic valve and EF - seeing TCTS later today. Says he is not drinking alcohol. Has been eating lots of dark green vegetables. Minimal soreness of the incisions still persists.   Current Outpatient Prescriptions  Medication Sig Dispense Refill  . aspirin EC 81 MG EC tablet Take 1 tablet (81 mg total) by mouth daily.      Marland Kitchen ezetimibe (ZETIA) 10 MG tablet Take 1 tablet (10 mg total) by mouth daily.  30 tablet  6  . furosemide (LASIX) 20 MG tablet Take 20 mg by mouth daily.      Marland Kitchen HYDROcodone-acetaminophen (NORCO) 10-325 MG per tablet 1 tablet every 6 (six) hours as needed.       . metoprolol tartrate (LOPRESSOR) 12.5 mg TABS tablet Take 0.5 tablets (12.5 mg total) by mouth 2 (two) times daily.  60 each  1  . omeprazole (PRILOSEC) 20 MG capsule Take 20 mg by mouth daily.       . potassium chloride SA (K-DUR,KLOR-CON) 20 MEQ tablet Take 1 tablet (20 mEq total) by mouth daily.  30 tablet  1  . ramipril (ALTACE) 5 MG capsule Take 1 capsule (5 mg total) by mouth daily.  30 capsule  6  . tiotropium (SPIRIVA) 18 MCG inhalation capsule Place 18 mcg into inhaler and inhale daily.      Marland Kitchen warfarin (COUMADIN) 5 MG tablet Take 1 tablet (5 mg total) by mouth daily at 6 PM. As directed  by coumadin clinic  100 tablet  1  . zolpidem (AMBIEN) 5 MG tablet Take 5 mg by mouth at bedtime as needed. For insomnia.      . Cholecalciferol (VITAMIN D3) 1000 UNITS CAPS Take 1 capsule by mouth daily.        No current facility-administered medications for this visit.    Allergies  Allergen Reactions  . Rosuvastatin Other (See Comments)    Muscle aches    Past Medical History  Diagnosis Date  . 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 Cerebrovascular disease, unspecified   . Esophageal reflux   . Generalized osteoarthrosis, unspecified site   . Hypertension   . COPD (chronic obstructive pulmonary disease) 1999  . Lung nodules 2008, 2014     LLL nodule removed 2008 with LLL lobectomy,  RLL nodule 3mm observation  . Atrial fibrillation 11/11/2008    Qualifier: Diagnosis of  By: Riley Kill, MD, Johny Sax   . CARDIOMYOPATHY 10/20/2008    Qualifier: Diagnosis of  By: Manson Passey, RN, BSN, Lauren    . ISCHEMIC COLITIS, HX OF 10/20/2008    Qualifier: Diagnosis of  By: Manson Passey, RN, BSN, Lauren    . 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 HYPERTENSION, BENIGN 10/20/2008    Qualifier: Diagnosis of  By: Manson Passey, RN, BSN, Lauren    . Epidural hematoma 03/01/2011    Recent fall 2012   . Severe mitral regurgitation 02/20/2013  . Iron deficiency anemia, unspecified   . S/P mitral valve replacement with bioprosthetic valve 03/24/2013    Redo mitral valve replacement using 29mm Edwards Magna mitral bovine bioprosthetic tissue valve performed via right mini thoracotomy    Past Surgical History  Procedure Laterality Date  . Mitral valve replacement  02/01/1998    Carpentier-Edwards porcine bioprosthetic tissue valve, size 31mm, placed for complicated bacterial endocarditis  . Video assisted thoracoscopy  04/29/2007    Left VATS w/ mini thoracotomy for Left Lower Lobectomy for benign lung nodules  . Tee without cardioversion N/A 02/12/2013    Procedure: TRANSESOPHAGEAL ECHOCARDIOGRAM (TEE);  Surgeon: Pricilla Riffle, MD;  Location: [facility] ENDOSCOPY;  Service: Cardiovascular;  Laterality: N/A;  . Mitral valve replacement Right 03/24/2013    Procedure: MINIMALLY INVASIVE REDO MITRAL VALVE (MV) REPLACEMENT;  Surgeon: Purcell Nails, MD;  Location: [facility] OR;  Service: Open Heart Surgery;  Laterality: Right;  . Minimally invasive maze procedure N/A 03/24/2013    Procedure: MINIMALLY INVASIVE MAZE PROCEDURE;  Surgeon: Purcell Nails, MD;  Location: [facility] OR;  Service: Open Heart Surgery;   Laterality: N/A;  . Intraoperative transesophageal echocardiogram N/A 03/24/2013    Procedure: INTRAOPERATIVE TRANSESOPHAGEAL ECHOCARDIOGRAM;  Surgeon: Purcell Nails, MD;  Location: [facility] OR;  Service: Open Heart Surgery;  Laterality: N/A;    History  Smoking status  . Former Smoker -- 2.00 packs/day for 40 years  . Types: Cigarettes  . Quit date: 07/30/2004  Smokeless tobacco  . Never Used    History  Alcohol Use  . Yes    No family history on file.  Review of Systems: The review of systems is per the HPI.  All other systems were reviewed and are negative.  Physical Exam: BP 130/64  Pulse 60  Ht 5' 5.25" (1.657 m)  Wt 162 lb (73.483 kg)  BMI 26.76  kg/m2 Patient is very pleasant and in no acute distress. He is a little kyphotic. Skin is warm and dry. Mild bruising noted. Color is normal.  HEENT is unremarkable. Normocephalic/atraumatic. PERRL. Sclera are nonicteric. Neck is supple. No masses. No JVD. Lungs are clear. Cardiac exam shows an irregular rhythm. Rate is ok. Abdomen is soft. Extremities are without edema. Gait and ROM are intact. No gross neurologic deficits noted.  LABORATORY DATA: PENDING  Lab Results  Component Value Date   WBC 7.3 04/15/2013   HGB 11.4* 04/15/2013   HCT 33.8* 04/15/2013   PLT 298.0 04/15/2013   GLUCOSE 91 04/15/2013   ALT 20 03/31/2013   AST 17 03/31/2013   NA 135 04/15/2013   K 4.4 04/15/2013   CL 101 04/15/2013   CREATININE 1.5 04/15/2013   BUN 22 04/15/2013   CO2 28 04/15/2013   TSH 1.56 01/04/2009   INR 7.0 05/11/2013   HGBA1C 5.4 03/20/2013   Lab Results  Component Value Date   INR 7.0 05/11/2013   INR 4.1 04/29/2013   INR 2.2 04/15/2013   PROTIME 19.9 01/17/2009   PROTIME 19.5 01/11/2009   PROTIME 17.6 01/04/2009    Echo Study Conclusions October 2014  - Left ventricle: The cavity size was normal. Wall thickness was normal. Systolic function was normal. The estimated ejection fraction was in the range of 55% to 60%. Wall motion was  normal; there were no regional wall motion abnormalities. Doppler parameters are consistent with high ventricular filling pressure. - Mitral valve: A bioprosthesis was present. - Left atrium: The atrium was moderately to severely dilated. - Pulmonary arteries: PA peak pressure: 32mm Hg (S).   Assessment / Plan:  1. Chronic atrial fib - on coumadin. Rate is controlled.   2. Chronic coumadin - INR over 7 today = waiting on venipuncture - says he is not drinking alcohol - but questionable in my opinion  3. S/p redo minimally invasive MVR - echo earlier this month looked good. Seeing Dr. Cornelius Moras later today  4. HTN - BP is good. No change in medicines  Recheck his labs today. Continue rehab. See Dr. Shirlee Latch in 2 to 3 months. No change with his medicines.   Patient is agreeable to this plan and will call if any problems develop in the interim.   Rosalio Macadamia, RN, ANP-C [facility] [address] [place], [place]  [address]

## 2013-05-11 NOTE — Progress Notes (Signed)
[address]       [place] [address]             [phone]     CARDIOTHORACIC SURGERY OFFICE NOTE  Referring Provider is Marca Ancona, MD PCP is Alva Garnet., MD   HPI:  Patient returns for routine followup status post minimally invasive redo mitral valve replacement using a bioprosthetic tissue valve on 03/24/2013. His postoperative hospital course was notable for the development of postoperative rate controlled atrial fibrillation, but otherwise his recovery has been remarkably uncomplicated.  He was last seen here in our office on 04/06/2013 and since then he has been seen twice by Norma Fredrickson, including earlier today when he was noted to be supratherapeutic on coumadin.  Overall the patient reports to be doing well. He states that he no longer has any significant residual soreness in his chest. He still has some exertional shortness of breath, but he notes that this is dramatically better than it was prior to surgery. He is participating in the cardiac rehabilitation program and making some progress. He denies any tachypalpitations. He has not had any dizzy spells. Overall he has no complaints.  He does note that this past weekend he went fishing on a boat and since then he has had a stiff neck. This seems to be improving. There are no associated radicular symptoms or signs of pain or numbness in any of his extremities.    Current Outpatient Prescriptions  Medication Sig Dispense Refill  . aspirin EC 81 MG EC tablet Take 1 tablet (81 mg total) by mouth daily.      . Cholecalciferol (VITAMIN D3) 1000 UNITS CAPS Take 1 capsule by mouth daily.       Marland Kitchen ezetimibe (ZETIA) 10 MG tablet Take 1 tablet (10 mg total) by mouth daily.  30 tablet  6  . furosemide (LASIX) 20 MG tablet Take 20 mg by mouth daily.      Marland Kitchen HYDROcodone-acetaminophen (NORCO) 10-325 MG per tablet 1 tablet every 6 (six) hours as needed.       . metoprolol tartrate (LOPRESSOR) 12.5 mg TABS  tablet Take 0.5 tablets (12.5 mg total) by mouth 2 (two) times daily.  60 each  1  . omeprazole (PRILOSEC) 20 MG capsule Take 20 mg by mouth daily.       . potassium chloride SA (K-DUR,KLOR-CON) 20 MEQ tablet Take 1 tablet (20 mEq total) by mouth daily.  30 tablet  1  . ramipril (ALTACE) 5 MG capsule Take 1 capsule (5 mg total) by mouth daily.  30 capsule  6  . tiotropium (SPIRIVA) 18 MCG inhalation capsule Place 18 mcg into inhaler and inhale daily.      Marland Kitchen warfarin (COUMADIN) 5 MG tablet Take 1 tablet (5 mg total) by mouth daily at 6 PM. As directed by coumadin clinic  100 tablet  1  . zolpidem (AMBIEN) 5 MG tablet Take 5 mg by mouth at bedtime as needed. For insomnia.       No current facility-administered medications for this visit.      Physical Exam:   BP 90/55  Pulse 58  Resp 16  Ht 5' 5.25" (1.657 m)  Wt 162 lb (73.483 kg)  BMI 26.76 kg/m2  SpO2 95%  General:  Well-appearing  Chest:   Clear to auscultation with symmetrical breath sounds  CV:   Regular rate and rhythm without murmur  Incisions:  Healing nicely  Abdomen:  Soft and nontender  Extremities:  Warm and well-perfused with no lower extremity edema  Diagnostic Tests:  2 channel telemetry rhythm strip demonstrates what appears to be sinus rhythm   Transthoracic Echocardiography  Patient: Jeffery Copeland, Jeffery Copeland MR #: [id number] Study Date: 05/04/2013 Gender: M Age: 67 Height: 165.1cm Weight: 73.5kg BSA: 1.61m^2 Pt. Status: Room:  ATTENDING Elwyn Reach, Dalton Darin Engels, Dalton SONOGRAPHER Aida Raider, RDCS PERFORMING [facility], [facility] cc:  ------------------------------------------------------------ LV EF: 55% - 60%  ------------------------------------------------------------ Indications: 424.0 Mitral valve disease.  ------------------------------------------------------------ History: PMH: Acquired from the patient and from the patient's chart. PMH: Atrial  Fibrillation/Atrial Flutter. Cardiomyopathy. Dyspnea. COPD. 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 Carotid Artery Stenosis. Risk factors: Former tobacco use. Hypertension.  ------------------------------------------------------------ Study Conclusions  - Left ventricle: The cavity size was normal. Wall thickness was normal. Systolic function was normal. The estimated ejection fraction was in the range of 55% to 60%. Wall motion was normal; there were no regional wall motion abnormalities. Doppler parameters are consistent with high ventricular filling pressure. - Mitral valve: A bioprosthesis was present. - Left atrium: The atrium was moderately to severely dilated. - Pulmonary arteries: PA peak pressure: 32mm Hg (S). Impressions:  - Normally functioning prosthetic MV.  ------------------------------------------------------------ Labs, prior tests, procedures, and surgery: February 01, 1998-Mitral Valve Replacement with 31 mm Carpentier-Edwards Porcine bioprosthetic Tissue Valve. March 24, 2013-Redo Mitral Valve Replacement with 29 mm Edwards-Magna Mitral Bioprosthetic Bovine Tissue Valve by Right Mini Thoracotomy. Transthoracic echocardiography. M-mode, complete 2D, spectral Doppler, and color Doppler. Height: Height: 165.1cm. Height: 65in. Weight: Weight: 73.5kg. Weight: 161.7lb. Body mass index: BMI: 27kg/m^2. Body surface area: BSA: 1.43m^2. Blood pressure: 150/90. Patient status: Outpatient. Location: [facility]  ------------------------------------------------------------  ------------------------------------------------------------ Left ventricle: The cavity size was normal. Wall thickness was normal. Systolic function was normal. The estimated ejection fraction was in the range of 55% to 60%. Wall motion was normal; there were no regional wall motion abnormalities. Doppler parameters are consistent with high ventricular filling  pressure.  ------------------------------------------------------------ Aortic valve: Trileaflet; mildly thickened leaflets. Mobility was not restricted. Doppler: Transvalvular velocity was within the normal range. There was no stenosis. No regurgitation.  ------------------------------------------------------------ Aorta: Aortic root: The aortic root was normal in size.  ------------------------------------------------------------ Mitral valve: A bioprosthesis was present. Doppler: Transvalvular velocity was within the normal range. There was no evidence for stenosis. No regurgitation. Valve area by pressure half-time: 2.18cm^2. Indexed valve area by pressure half-time: 1.2cm^2/m^2. Mean gradient: 6mm Hg (D). Peak gradient: 16mm Hg (D).  ------------------------------------------------------------ Left atrium: The atrium was moderately to severely dilated.  ------------------------------------------------------------ Right ventricle: The cavity size was normal. Systolic function was normal.  ------------------------------------------------------------ Pulmonic valve: Doppler: Transvalvular velocity was within the normal range. There was no evidence for stenosis.  ------------------------------------------------------------ Tricuspid valve: Structurally normal valve. Doppler: Transvalvular velocity was within the normal range. Mild regurgitation.  ------------------------------------------------------------ Pulmonary artery: Systolic pressure was at the upper limits of normal.  ------------------------------------------------------------ Right atrium: The atrium was normal in size.  ------------------------------------------------------------ Pericardium: There was no pericardial effusion.  ------------------------------------------------------------ Systemic veins: Inferior vena cava: The vessel was normal in  size.  ------------------------------------------------------------  2D measurements Normal Doppler measurements Normal Left ventricle Main pulmonary LVID ED, 41.6 mm 43-52 artery chord, Pressure, 32 mm Hg =30 PLAX S LVID ES, 29 mm 23-38 Left ventricle chord, Ea, lat 8.88 cm/s ------ PLAX ann, tiss FS, chord, 30 % >29 DP PLAX E/Ea, lat 19.8 ------ LVPW, ED 10 mm ------ ann, tiss 2 IVS/LVPW 1.02 <1.3 DP ratio,  ED Ea, med 6.47 cm/s ------ Ventricular septum ann, tiss IVS, ED 10.2 mm ------ DP LVOT E/Ea, med 27.2 ------ Diam, S 21 mm ------ ann, tiss Area 3.46 cm^2 ------ DP Diam 21 mm ------ LVOT Aorta Peak vel, 82.1 cm/s ------ Root diam, 36 mm ------ S ED VTI, S 18 cm ------ Left atrium Stroke vol 62.3 ml ------ AP dim 74 mm ------ Stroke 34.4 ml/m^2 ------ AP dim 4.09 cm/m^2 <2.2 index index Mitral valve Peak E vel 176 cm/s ------ Peak A vel 103 cm/s ------ Mean vel, 107 cm/s ------ D Decelerati 225 ms 150-23 on time 0 Pressure 101 ms ------ half-time Mean 6 mm Hg ------ gradient, D Peak 16 mm Hg ------ gradient, D Peak E/A 1.7 ------ ratio Area (PHT) 2.18 cm^2 ------ Area index 1.2 cm^2/m ------ (PHT) ^2 Annulus 65 cm ------ VTI Tricuspid valve Regurg 262 cm/s ------ peak vel Peak RV-RA 27 mm Hg ------ gradient, S Systemic veins Estimated 5 mm Hg ------ CVP Right ventricle Pressure, 32 mm Hg <30 S Sa vel, 8.88 cm/s ------ lat ann, tiss DP  ------------------------------------------------------------ Prepared and Electronically Authenticated by  Olga Millers 2014-10-06T10:08:56.103    Impression:  Patient is doing well approximately 6 weeks status post minimally invasive redo mitral valve replacement. He developed recurrent persistent atrial fibrillation postoperatively for which he was discharged from the hospital on amiodarone and Coumadin. At last followup he appeared to be in atrial fibrillation and amiodarone was stopped last  month, although rhythm strip performed in the office today suggests that he may have converted back to sinus rhythm.  Clinically he is doing quite well although his prothrombin time INR was measured 7.0 on routine followup earlier today.    Plan:  The patient will continue to followup closely with the [facility] until his Coumadin dose has been stabilized. We have not recommended any changes to his current medications in the office today. I've encouraged patient to continue to gradually increase his physical activity as tolerated without any particular limitations at this time. We will plan to see him back in 3 months for routine followup.   Salvatore Decent. Cornelius Moras, MD 05/11/2013 2:01 PM

## 2013-05-13 ENCOUNTER — Encounter (HOSPITAL_COMMUNITY)
Admission: RE | Admit: 2013-05-13 | Discharge: 2013-05-13 | Disposition: A | Discharge status: Home / Self Care | Payer: Medicare PPO | Source: Ambulatory Visit | Attending: Cardiology | Admitting: Cardiology

## 2013-05-13 DIAGNOSIS — Z5189 Encounter for other specified aftercare: Secondary | ICD-10-CM | POA: Diagnosis not present

## 2013-05-15 ENCOUNTER — Encounter (HOSPITAL_COMMUNITY)
Admission: RE | Admit: 2013-05-15 | Discharge: 2013-05-15 | Disposition: A | Discharge status: Home / Self Care | Payer: Medicare PPO | Source: Ambulatory Visit | Attending: Cardiology | Admitting: Cardiology

## 2013-05-15 DIAGNOSIS — Z5189 Encounter for other specified aftercare: Secondary | ICD-10-CM | POA: Diagnosis not present

## 2013-05-18 ENCOUNTER — Encounter (HOSPITAL_COMMUNITY)
Admission: RE | Admit: 2013-05-18 | Discharge: 2013-05-18 | Disposition: A | Discharge status: Home / Self Care | Payer: Medicare PPO | Source: Ambulatory Visit | Attending: Cardiology | Admitting: Cardiology

## 2013-05-18 ENCOUNTER — Ambulatory Visit (INDEPENDENT_AMBULATORY_CARE_PROVIDER_SITE_OTHER): Payer: Commercial Managed Care - HMO | Admitting: Pharmacist

## 2013-05-18 DIAGNOSIS — Z5189 Encounter for other specified aftercare: Secondary | ICD-10-CM | POA: Diagnosis not present

## 2013-05-18 DIAGNOSIS — Z5181 Encounter for therapeutic drug level monitoring: Secondary | ICD-10-CM

## 2013-05-18 DIAGNOSIS — I4891 Unspecified atrial fibrillation: Secondary | ICD-10-CM

## 2013-05-18 DIAGNOSIS — Z7901 Long term (current) use of anticoagulants: Secondary | ICD-10-CM

## 2013-05-20 ENCOUNTER — Encounter (HOSPITAL_COMMUNITY)
Admission: RE | Admit: 2013-05-20 | Discharge: 2013-05-20 | Disposition: A | Discharge status: Home / Self Care | Payer: Medicare PPO | Source: Ambulatory Visit | Attending: Cardiology | Admitting: Cardiology

## 2013-05-20 DIAGNOSIS — Z5189 Encounter for other specified aftercare: Secondary | ICD-10-CM | POA: Diagnosis not present

## 2013-05-22 ENCOUNTER — Encounter (HOSPITAL_COMMUNITY)
Admission: RE | Admit: 2013-05-22 | Discharge: 2013-05-22 | Disposition: A | Discharge status: Home / Self Care | Payer: Medicare PPO | Source: Ambulatory Visit | Attending: Cardiology | Admitting: Cardiology

## 2013-05-22 DIAGNOSIS — Z5189 Encounter for other specified aftercare: Secondary | ICD-10-CM | POA: Diagnosis not present

## 2013-05-25 ENCOUNTER — Encounter (HOSPITAL_COMMUNITY): Payer: Medicare PPO

## 2013-05-27 ENCOUNTER — Encounter (HOSPITAL_COMMUNITY)
Admission: RE | Admit: 2013-05-27 | Discharge: 2013-05-27 | Disposition: A | Discharge status: Home / Self Care | Payer: Medicare PPO | Source: Ambulatory Visit | Attending: Cardiology | Admitting: Cardiology

## 2013-05-27 DIAGNOSIS — Z5189 Encounter for other specified aftercare: Secondary | ICD-10-CM | POA: Diagnosis not present

## 2013-05-27 NOTE — Progress Notes (Signed)
Jeffery Copeland 67 y.o. male Nutrition Note Spoke with pt.  Nutrition Plan and Nutrition Survey goals reviewed with pt. Pt is following Step 2 of the Therapeutic Lifestyle Changes diet. Per nutrition screen, the pt said he wanted to lose wt. Pt has not been actively trying to lose wt at this time. Pt states "I'd like to get rid of this belly." Pt eats 2 meals/day most of the time because "I can't eat if I'm not hungry." Importance of eating more frequently encouraged. Pt reportedly receives food stamps and [employer] food packages due to difficulty buying food. Pt reports he really does not care for the frozen meals he receives. Pt taking Coumadin and is aware of the need to follow a diet with Consistent Vitamin K intake. Pt expressed understanding of the information reviewed. Pt aware of nutrition education classes offered and is unable to attend nutrition classes.  Nutrition Diagnosis   Food-and nutrition-related knowledge deficit related to lack of exposure to information as related to diagnosis of: ? CVD    Overweight related to excessive energy intake as evidenced by a BMI of 27.0  Nutrition RX/ Estimated Daily Nutrition Needs for: wt loss  1200-1700 Kcal, 30-45 gm fat, 7-11 gm sat fat, 1.1-1.7 gm trans-fat, <1500 mg sodium  Nutrition Intervention   Pt's individual nutrition plan reviewed with pt.   Benefits of adopting Therapeutic Lifestyle Changes discussed when Medficts reviewed.   Pt to attend the Portion Distortion class   Pt given handouts for: ? Nutrition I class ? Nutrition II class   Continue client-centered nutrition education by RD, as part of interdisciplinary care. Goal(s)   Pt to identify and limit food sources of saturated fat, trans fat, and cholesterol   Pt to identify food quantities necessary to achieve: ? wt loss to a goal wt of 139-157 lb (63.3-71.5 kg) at graduation from cardiac rehab.  Monitor and Evaluate progress toward nutrition goal with team. Nutrition Risk: Change to  Moderate   [name], M.Ed, RD, LDN, CDE 05/27/2013 10:27 AM

## 2013-05-29 ENCOUNTER — Encounter (HOSPITAL_COMMUNITY)
Admission: RE | Admit: 2013-05-29 | Discharge: 2013-05-29 | Disposition: A | Discharge status: Home / Self Care | Payer: Medicare PPO | Source: Ambulatory Visit | Attending: Cardiology | Admitting: Cardiology

## 2013-05-29 ENCOUNTER — Ambulatory Visit (INDEPENDENT_AMBULATORY_CARE_PROVIDER_SITE_OTHER): Payer: Medicare PPO | Admitting: Pharmacist

## 2013-05-29 DIAGNOSIS — Z954 Presence of other heart-valve replacement: Secondary | ICD-10-CM

## 2013-05-29 DIAGNOSIS — Z952 Presence of prosthetic heart valve: Secondary | ICD-10-CM

## 2013-05-29 DIAGNOSIS — Z7901 Long term (current) use of anticoagulants: Secondary | ICD-10-CM

## 2013-05-29 DIAGNOSIS — I059 Rheumatic mitral valve disease, unspecified: Secondary | ICD-10-CM

## 2013-05-29 DIAGNOSIS — I4891 Unspecified atrial fibrillation: Secondary | ICD-10-CM

## 2013-05-29 DIAGNOSIS — Z5189 Encounter for other specified aftercare: Secondary | ICD-10-CM | POA: Diagnosis not present

## 2013-05-29 LAB — POCT INR: INR: 3.1

## 2013-06-01 ENCOUNTER — Encounter (HOSPITAL_COMMUNITY)
Admission: RE | Admit: 2013-06-01 | Discharge: 2013-06-01 | Disposition: A | Discharge status: Home / Self Care | Payer: Medicare HMO | Source: Ambulatory Visit | Attending: Cardiology | Admitting: Cardiology

## 2013-06-01 DIAGNOSIS — Z7901 Long term (current) use of anticoagulants: Secondary | ICD-10-CM | POA: Insufficient documentation

## 2013-06-01 DIAGNOSIS — I4891 Unspecified atrial fibrillation: Secondary | ICD-10-CM | POA: Insufficient documentation

## 2013-06-01 DIAGNOSIS — Z5189 Encounter for other specified aftercare: Secondary | ICD-10-CM | POA: Insufficient documentation

## 2013-06-01 DIAGNOSIS — I509 Heart failure, unspecified: Secondary | ICD-10-CM | POA: Insufficient documentation

## 2013-06-01 DIAGNOSIS — Z952 Presence of prosthetic heart valve: Secondary | ICD-10-CM | POA: Insufficient documentation

## 2013-06-01 DIAGNOSIS — I059 Rheumatic mitral valve disease, unspecified: Secondary | ICD-10-CM | POA: Insufficient documentation

## 2013-06-01 DIAGNOSIS — I2789 Other specified pulmonary heart diseases: Secondary | ICD-10-CM | POA: Insufficient documentation

## 2013-06-03 ENCOUNTER — Encounter (HOSPITAL_COMMUNITY)
Admission: RE | Admit: 2013-06-03 | Discharge: 2013-06-03 | Disposition: A | Discharge status: Home / Self Care | Payer: Medicare HMO | Source: Ambulatory Visit | Attending: Cardiology | Admitting: Cardiology

## 2013-06-05 ENCOUNTER — Encounter (HOSPITAL_COMMUNITY)
Admission: RE | Admit: 2013-06-05 | Discharge: 2013-06-05 | Disposition: A | Discharge status: Home / Self Care | Payer: Medicare HMO | Source: Ambulatory Visit | Attending: Cardiology | Admitting: Cardiology

## 2013-06-08 ENCOUNTER — Encounter (HOSPITAL_COMMUNITY): Payer: Medicare HMO

## 2013-06-10 ENCOUNTER — Encounter (HOSPITAL_COMMUNITY): Payer: Medicare HMO

## 2013-06-12 ENCOUNTER — Encounter (HOSPITAL_COMMUNITY)
Admission: RE | Admit: 2013-06-12 | Discharge: 2013-06-12 | Disposition: A | Discharge status: Home / Self Care | Payer: Medicare HMO | Source: Ambulatory Visit | Attending: Cardiology | Admitting: Cardiology

## 2013-06-15 ENCOUNTER — Encounter (HOSPITAL_COMMUNITY)
Admission: RE | Admit: 2013-06-15 | Discharge: 2013-06-15 | Disposition: A | Discharge status: Home / Self Care | Payer: Medicare HMO | Source: Ambulatory Visit | Attending: Cardiology | Admitting: Cardiology

## 2013-06-15 NOTE — Progress Notes (Signed)
Pt weight up 5 lbs today at cardiac rehab.  Pt denies chest pain, dyspnea,  Edema or abdominal distention.  Lungs clear, no edema present, abdomen soft, non tender.  Pt denies change in diet.  Will continue to monitor.

## 2013-06-17 ENCOUNTER — Encounter (HOSPITAL_COMMUNITY)
Admission: RE | Admit: 2013-06-17 | Discharge: 2013-06-17 | Disposition: A | Discharge status: Home / Self Care | Payer: Medicare HMO | Source: Ambulatory Visit | Attending: Cardiology | Admitting: Cardiology

## 2013-06-19 ENCOUNTER — Ambulatory Visit (INDEPENDENT_AMBULATORY_CARE_PROVIDER_SITE_OTHER): Payer: Commercial Managed Care - HMO | Admitting: General Practice

## 2013-06-19 ENCOUNTER — Encounter (HOSPITAL_COMMUNITY)
Admission: RE | Admit: 2013-06-19 | Discharge: 2013-06-19 | Disposition: A | Discharge status: Home / Self Care | Payer: Medicare HMO | Source: Ambulatory Visit | Attending: Cardiology | Admitting: Cardiology

## 2013-06-19 DIAGNOSIS — Z7901 Long term (current) use of anticoagulants: Secondary | ICD-10-CM

## 2013-06-19 DIAGNOSIS — Z954 Presence of other heart-valve replacement: Secondary | ICD-10-CM | POA: Diagnosis not present

## 2013-06-19 DIAGNOSIS — I059 Rheumatic mitral valve disease, unspecified: Secondary | ICD-10-CM | POA: Diagnosis not present

## 2013-06-19 DIAGNOSIS — Z952 Presence of prosthetic heart valve: Secondary | ICD-10-CM

## 2013-06-19 DIAGNOSIS — I4891 Unspecified atrial fibrillation: Secondary | ICD-10-CM | POA: Diagnosis not present

## 2013-06-19 LAB — POCT INR: INR: 3.6

## 2013-06-22 ENCOUNTER — Encounter (HOSPITAL_COMMUNITY)
Admission: RE | Admit: 2013-06-22 | Discharge: 2013-06-22 | Disposition: A | Discharge status: Home / Self Care | Payer: Medicare HMO | Source: Ambulatory Visit | Attending: Cardiology | Admitting: Cardiology

## 2013-06-24 ENCOUNTER — Encounter (HOSPITAL_COMMUNITY): Payer: Medicare HMO

## 2013-06-26 ENCOUNTER — Encounter (HOSPITAL_COMMUNITY): Payer: Medicare HMO

## 2013-06-29 ENCOUNTER — Encounter (HOSPITAL_COMMUNITY)
Admission: RE | Admit: 2013-06-29 | Discharge: 2013-06-29 | Disposition: A | Discharge status: Home / Self Care | Payer: Medicare HMO | Source: Ambulatory Visit | Attending: Cardiology | Admitting: Cardiology

## 2013-06-29 DIAGNOSIS — I059 Rheumatic mitral valve disease, unspecified: Secondary | ICD-10-CM | POA: Insufficient documentation

## 2013-06-29 DIAGNOSIS — Z7901 Long term (current) use of anticoagulants: Secondary | ICD-10-CM | POA: Insufficient documentation

## 2013-06-29 DIAGNOSIS — Z5189 Encounter for other specified aftercare: Secondary | ICD-10-CM | POA: Insufficient documentation

## 2013-06-29 DIAGNOSIS — I4891 Unspecified atrial fibrillation: Secondary | ICD-10-CM | POA: Insufficient documentation

## 2013-06-29 DIAGNOSIS — I509 Heart failure, unspecified: Secondary | ICD-10-CM | POA: Insufficient documentation

## 2013-06-29 DIAGNOSIS — I2789 Other specified pulmonary heart diseases: Secondary | ICD-10-CM | POA: Insufficient documentation

## 2013-06-29 DIAGNOSIS — Z952 Presence of prosthetic heart valve: Secondary | ICD-10-CM | POA: Insufficient documentation

## 2013-07-01 ENCOUNTER — Encounter (HOSPITAL_COMMUNITY)
Admission: RE | Admit: 2013-07-01 | Discharge: 2013-07-01 | Disposition: A | Discharge status: Home / Self Care | Payer: Medicare HMO | Source: Ambulatory Visit | Attending: Cardiology | Admitting: Cardiology

## 2013-07-03 ENCOUNTER — Encounter (HOSPITAL_COMMUNITY): Payer: Medicare HMO

## 2013-07-03 ENCOUNTER — Other Ambulatory Visit: Payer: Self-pay | Admitting: Cardiology

## 2013-07-03 ENCOUNTER — Other Ambulatory Visit: Payer: Self-pay | Admitting: Thoracic Surgery (Cardiothoracic Vascular Surgery)

## 2013-07-06 ENCOUNTER — Encounter (HOSPITAL_COMMUNITY)
Admission: RE | Admit: 2013-07-06 | Discharge: 2013-07-06 | Disposition: A | Discharge status: Home / Self Care | Payer: Medicare HMO | Source: Ambulatory Visit | Attending: Cardiology | Admitting: Cardiology

## 2013-07-08 ENCOUNTER — Encounter (HOSPITAL_COMMUNITY)
Admission: RE | Admit: 2013-07-08 | Discharge: 2013-07-08 | Disposition: A | Discharge status: Home / Self Care | Payer: Medicare HMO | Source: Ambulatory Visit | Attending: Cardiology | Admitting: Cardiology

## 2013-07-10 ENCOUNTER — Encounter (HOSPITAL_COMMUNITY)
Admission: RE | Admit: 2013-07-10 | Discharge: 2013-07-10 | Disposition: A | Discharge status: Home / Self Care | Payer: Medicare HMO | Source: Ambulatory Visit | Attending: Cardiology | Admitting: Cardiology

## 2013-07-13 ENCOUNTER — Encounter (HOSPITAL_COMMUNITY)
Admission: RE | Admit: 2013-07-13 | Discharge: 2013-07-13 | Disposition: A | Discharge status: Home / Self Care | Payer: Medicare HMO | Source: Ambulatory Visit | Attending: Cardiology | Admitting: Cardiology

## 2013-07-15 ENCOUNTER — Encounter (HOSPITAL_COMMUNITY)
Admission: RE | Admit: 2013-07-15 | Discharge: 2013-07-15 | Disposition: A | Discharge status: Home / Self Care | Payer: Medicare HMO | Source: Ambulatory Visit | Attending: Cardiology | Admitting: Cardiology

## 2013-07-15 ENCOUNTER — Encounter (HOSPITAL_COMMUNITY): Payer: Self-pay

## 2013-07-17 ENCOUNTER — Ambulatory Visit (INDEPENDENT_AMBULATORY_CARE_PROVIDER_SITE_OTHER): Payer: Medicare HMO | Admitting: *Deleted

## 2013-07-17 ENCOUNTER — Encounter (HOSPITAL_COMMUNITY)
Admission: RE | Admit: 2013-07-17 | Discharge: 2013-07-17 | Disposition: A | Discharge status: Home / Self Care | Payer: Medicare HMO | Source: Ambulatory Visit | Attending: Cardiology | Admitting: Cardiology

## 2013-07-17 DIAGNOSIS — Z7901 Long term (current) use of anticoagulants: Secondary | ICD-10-CM

## 2013-07-17 DIAGNOSIS — Z954 Presence of other heart-valve replacement: Secondary | ICD-10-CM

## 2013-07-17 DIAGNOSIS — I059 Rheumatic mitral valve disease, unspecified: Secondary | ICD-10-CM

## 2013-07-17 DIAGNOSIS — Z952 Presence of prosthetic heart valve: Secondary | ICD-10-CM

## 2013-07-17 DIAGNOSIS — I4891 Unspecified atrial fibrillation: Secondary | ICD-10-CM

## 2013-07-17 LAB — POCT INR: INR: 3.9

## 2013-07-17 NOTE — Progress Notes (Addendum)
Pt graduated from cardiac rehab program today.  Medication list reconciled.  PHQ9 score-1.  This is improved from 7 at beginning of program.  Pt reports his quality of life is improved with more sense of mind, peace and security in neighborhood.  Increased police presence and neighborwood watch program has given him comfort.   Pt also reports he is able to participate in activities that are pleasureble to him due to increased stamina and decreased symptoms.  pt reports he is able to walk better and longer distances at home.   Pt has made significant lifestyle changes and should be commended for his success. Pt plans to continue exercise by walking at home and in local stores.

## 2013-07-29 ENCOUNTER — Ambulatory Visit (INDEPENDENT_AMBULATORY_CARE_PROVIDER_SITE_OTHER): Payer: Commercial Managed Care - HMO | Admitting: *Deleted

## 2013-07-29 ENCOUNTER — Ambulatory Visit (INDEPENDENT_AMBULATORY_CARE_PROVIDER_SITE_OTHER): Payer: Medicare HMO | Admitting: Cardiology

## 2013-07-29 ENCOUNTER — Encounter: Payer: Self-pay | Admitting: Cardiology

## 2013-07-29 VITALS — BP 120/80 | HR 65 | Ht 65.25 in | Wt 165.0 lb

## 2013-07-29 DIAGNOSIS — I509 Heart failure, unspecified: Secondary | ICD-10-CM

## 2013-07-29 DIAGNOSIS — Z9889 Other specified postprocedural states: Secondary | ICD-10-CM

## 2013-07-29 DIAGNOSIS — I4891 Unspecified atrial fibrillation: Secondary | ICD-10-CM

## 2013-07-29 DIAGNOSIS — Z7901 Long term (current) use of anticoagulants: Secondary | ICD-10-CM

## 2013-07-29 DIAGNOSIS — J449 Chronic obstructive pulmonary disease, unspecified: Secondary | ICD-10-CM

## 2013-07-29 DIAGNOSIS — I5032 Chronic diastolic (congestive) heart failure: Secondary | ICD-10-CM

## 2013-07-29 LAB — POCT INR: INR: 2.2

## 2013-07-29 NOTE — Patient Instructions (Signed)
Stop Lasix   Stop Potassium   Your physician wants you to follow-up in: 6 months You will receive a reminder letter in the mail two months in advance. If you don't receive a letter, please call our office to schedule the follow-up appointment.

## 2013-07-30 DIAGNOSIS — C61 Malignant neoplasm of prostate: Secondary | ICD-10-CM

## 2013-07-30 HISTORY — PX: OTHER SURGICAL HISTORY: SHX169

## 2013-07-30 HISTORY — DX: Malignant neoplasm of prostate: C61

## 2013-07-31 DIAGNOSIS — I5032 Chronic diastolic (congestive) heart failure: Secondary | ICD-10-CM | POA: Insufficient documentation

## 2013-07-31 NOTE — Progress Notes (Signed)
Patient ID: Jeffery Copeland, male   DOB: 05/17/1946, 68 y.o.   MRN: [id number] PCP: Dr. 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  He seems to have done well since his redo MVR.  Echo in 10/14 showed EF 55-60% with normal biorprosthetic mitral valve.  He is breathing better than before surgery.  He thinks he could walk up to a mile.  He has completed cardiac rehab.  No chest pain, lightheadedness, or syncope.  No orthopnea/PND.  He has been out of Lasix x 2 months but does not think it has affected his breathing.   ECG: Atrial fibrillation, right axis deviation, inferior TWIs.   Labs (4/14): K 4.1, creatinine 1.3 Labs (10/14): K 4.5, creatinine 1.3  PMH: 1. Fe deficiency anemia 2. 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  Echo (2/14) with EF 55-60%, bioprosthetic mitral valve with mean gradient 8 mmHg, no definite MR noted though there was significant shadowing, severe LAE.  TEE after 2/14 echo showed severe mitral regurgitation involving the bioprosthetic MV.  Patient had right mini-thoracotomy with redo MVR (bioprosthetic) in 8/14.  Echo (10/14) showed EF 55-60% with normal bioprosthetic mitral valve and PA systolic pressure 32 mmHg.  3. COPD: Followed by Dr. Joya Gaskins. 4. GERD 5. Lung nodules: LLL nodule removed in 2008 by left lower lobectomy.  RLL nodule, currently observing.  6. Atrial fibrillation: Chronic, on coumadin. 7. Chronic diastolic CHF 8. LHC (7/14) with no significant CAD.   SH: Divorced, lives alone in [place].  Quit smoking in 2006.    FH: No premature CAD.  ROS: All systems reviewed and negative except as per HPI.   Current Outpatient Prescriptions  Medication Sig Dispense Refill  . aspirin EC 81 MG EC tablet Take  1 tablet (81 mg total) by mouth daily.      . Cholecalciferol (VITAMIN D3) 1000 UNITS CAPS Take 1 capsule by mouth daily.       Marland Kitchen ezetimibe (ZETIA) 10 MG tablet Take 1 tablet (10 mg total) by mouth daily.  30 tablet  6  . HYDROcodone-acetaminophen (NORCO) 10-325 MG per tablet 1 tablet every 6 (six) hours as needed.       . metoprolol tartrate (LOPRESSOR) 25 MG tablet Take 12.5 mg by mouth 2 (two) times daily.      Marland Kitchen omeprazole (PRILOSEC) 20 MG capsule Take 20 mg by mouth daily.       . ramipril (ALTACE) 5 MG capsule TAKE 1 CAPSULE (5 MG TOTAL) BY MOUTH DAILY.  30 capsule  0  . tiotropium (SPIRIVA) 18 MCG inhalation capsule Place 18 mcg into inhaler and inhale daily.      Marland Kitchen warfarin (COUMADIN) 5 MG tablet Take 1 tablet (5 mg total) by mouth daily at 6 PM. As directed by coumadin clinic  100 tablet  1  . zolpidem (AMBIEN) 5 MG tablet Take 5 mg by mouth at bedtime as needed. For insomnia.       No current facility-administered medications for this visit.    BP 120/80  Pulse 65  Ht 5' 5.25" (1.657 m)  Wt 74.844 kg (165 lb)  BMI 27.26 kg/m2 General: NAD Neck: No JVD, no thyromegaly or thyroid nodule.  Lungs: Clear to auscultation bilaterally with normal respiratory effort. CV: Nondisplaced PMI.  Heart regular  S1/S2, 1/6 SEM RUSB.  No peripheral edema.  No carotid bruit.  Normal pedal pulses.  Abdomen: Soft, nontender, no hepatosplenomegaly, no distention.  Skin: Intact without lesions or rashes.  Neurologic: Alert and oriented x 3.  Psych: Normal affect. Extremities: No clubbing or cyanosis.   Assessment/Plan:  1. Bioprosthetic mitral valve:  Redo MVR in 8/14.  Valve looked good on 10/14 echo.  He seems to be doing well overall.   2. Atrial fibrillation: Chronic, good rate control on current dose of metoprolol.  Continue anticoagulation with coumadin given valvular atrial fibrillation.  3. Chronic diastolic CHF: He is not volume overloaded on exam.  He has been out of Lasix x 2 months with  no worsening of symptoms.  I think that he can stay off Lasix.  Therefore, he can also stop KCl.   4. COPD: Stable mild exertional dyspnea.  He is on Spiriva.   Loralie Champagne 07/31/2013 4:43 PM

## 2013-08-02 ENCOUNTER — Other Ambulatory Visit: Payer: Self-pay | Admitting: Cardiology

## 2013-08-20 ENCOUNTER — Ambulatory Visit (INDEPENDENT_AMBULATORY_CARE_PROVIDER_SITE_OTHER): Payer: Medicare HMO

## 2013-08-20 DIAGNOSIS — Z7901 Long term (current) use of anticoagulants: Secondary | ICD-10-CM

## 2013-08-20 DIAGNOSIS — Z5181 Encounter for therapeutic drug level monitoring: Secondary | ICD-10-CM

## 2013-08-20 DIAGNOSIS — I4891 Unspecified atrial fibrillation: Secondary | ICD-10-CM

## 2013-08-20 LAB — POCT INR: INR: 3

## 2013-08-31 ENCOUNTER — Other Ambulatory Visit: Payer: Self-pay | Admitting: Cardiology

## 2013-09-17 ENCOUNTER — Ambulatory Visit (INDEPENDENT_AMBULATORY_CARE_PROVIDER_SITE_OTHER): Payer: Commercial Managed Care - HMO | Admitting: *Deleted

## 2013-09-17 DIAGNOSIS — I4891 Unspecified atrial fibrillation: Secondary | ICD-10-CM

## 2013-09-17 DIAGNOSIS — Z7901 Long term (current) use of anticoagulants: Secondary | ICD-10-CM | POA: Diagnosis not present

## 2013-09-17 DIAGNOSIS — Z5181 Encounter for therapeutic drug level monitoring: Secondary | ICD-10-CM

## 2013-09-17 LAB — POCT INR: INR: 1.7

## 2013-09-21 ENCOUNTER — Ambulatory Visit: Payer: Medicare PPO | Admitting: Thoracic Surgery (Cardiothoracic Vascular Surgery)

## 2013-09-28 ENCOUNTER — Ambulatory Visit (INDEPENDENT_AMBULATORY_CARE_PROVIDER_SITE_OTHER): Payer: Medicare HMO | Admitting: Thoracic Surgery (Cardiothoracic Vascular Surgery)

## 2013-09-28 ENCOUNTER — Encounter: Payer: Self-pay | Admitting: Thoracic Surgery (Cardiothoracic Vascular Surgery)

## 2013-09-28 VITALS — BP 115/69 | HR 65 | Resp 20 | Ht 65.25 in | Wt 165.0 lb

## 2013-09-28 DIAGNOSIS — I059 Rheumatic mitral valve disease, unspecified: Secondary | ICD-10-CM

## 2013-09-28 DIAGNOSIS — Z8679 Personal history of other diseases of the circulatory system: Secondary | ICD-10-CM | POA: Insufficient documentation

## 2013-09-28 DIAGNOSIS — Z953 Presence of xenogenic heart valve: Secondary | ICD-10-CM

## 2013-09-28 DIAGNOSIS — I340 Nonrheumatic mitral (valve) insufficiency: Secondary | ICD-10-CM

## 2013-09-28 DIAGNOSIS — Z952 Presence of prosthetic heart valve: Secondary | ICD-10-CM

## 2013-09-28 DIAGNOSIS — Z9889 Other specified postprocedural states: Secondary | ICD-10-CM

## 2013-09-28 NOTE — Progress Notes (Signed)
[address]       [place] [address]             [phone]     CARDIOTHORACIC SURGERY OFFICE NOTE  Referring Provider is Loralie Champagne, MD PCP is Salena Saner., MD   HPI:  Patient returns for routine followup status post minimally invasive redo mitral valve replacement using a bioprosthetic tissue valve on 03/24/2013. His postoperative hospital course was notable for the development of postoperative rate controlled atrial fibrillation, but otherwise his recovery was remarkably uncomplicated.  He was last seen here in our office on 05/11/2013.  Since then he has continued to do very well and he was seen by Dr. [name] on 07/29/2013. Patient reports no problems whatsoever. His breathing is much better than it was prior to surgery. He states that he still has mild exertional shortness of breath that seems to come and go, but overall he is getting around much better than he was last summer before the surgery. He's not having any chest discomfort and he denies any palpitations or dizzy spells.    Current Outpatient Prescriptions  Medication Sig Dispense Refill  . aspirin EC 81 MG EC tablet Take 1 tablet (81 mg total) by mouth daily.      . Cholecalciferol (VITAMIN D3) 1000 UNITS CAPS Take 1 capsule by mouth daily.       Marland Kitchen ezetimibe (ZETIA) 10 MG tablet Take 1 tablet (10 mg total) by mouth daily.  30 tablet  6  . HYDROcodone-acetaminophen (NORCO) 10-325 MG per tablet 1 tablet every 6 (six) hours as needed.       . metoprolol tartrate (LOPRESSOR) 25 MG tablet TAKE 1/2 TABLET BY MOUTH TWO TIMES A DAY  30 tablet  3  . omeprazole (PRILOSEC) 20 MG capsule Take 20 mg by mouth daily.       . ramipril (ALTACE) 5 MG capsule TAKE 1 CAPSULE (5 MG TOTAL) BY MOUTH DAILY.  30 capsule  3  . tiotropium (SPIRIVA) 18 MCG inhalation capsule Place 18 mcg into inhaler and inhale daily.      Marland Kitchen warfarin (COUMADIN) 5 MG tablet Take 1 tablet (5 mg total) by mouth daily at 6 PM. As  directed by coumadin clinic  100 tablet  1  . zolpidem (AMBIEN) 5 MG tablet Take 5 mg by mouth at bedtime as needed. For insomnia.       No current facility-administered medications for this visit.      Physical Exam:   BP 115/69  Pulse 65  Resp 20  Ht 5' 5.25" (1.657 m)  Wt 165 lb (74.844 kg)  BMI 27.26 kg/m2  SpO2 95%  General:  Well-appearing  Chest:   Clear to auscultation  CV:   Regular rate and rhythm without murmur  Incisions:  Completely healed  Abdomen:  Soft and nontender  Extremities:  Warm and well-perfused  Diagnostic Tests:  2 channel telemetry rhythm strip demonstrates normal sinus rhythm   Impression:  Patient is doing well approximately 6 months following minimally invasive redo mitral valve replacement. He is currently maintaining sinus rhythm and describes stable symptoms of exertional shortness of breath, NYHA functional class I - II.  Plan:  The patient will continue to followup with Dr. [name] and return to see [place] for routine followup in 6 months. We will defer any decision regarding stopping Coumadin to Dr. [name], although the patient appears to be maintaining sinus rhythm at this time.   Valentina Gu.  Roxy Manns, MD 09/28/2013 2:15 PM

## 2013-10-01 ENCOUNTER — Ambulatory Visit (INDEPENDENT_AMBULATORY_CARE_PROVIDER_SITE_OTHER): Payer: Commercial Managed Care - HMO | Admitting: Pharmacist

## 2013-10-01 DIAGNOSIS — Z5181 Encounter for therapeutic drug level monitoring: Secondary | ICD-10-CM

## 2013-10-01 DIAGNOSIS — Z7901 Long term (current) use of anticoagulants: Secondary | ICD-10-CM

## 2013-10-01 DIAGNOSIS — I4891 Unspecified atrial fibrillation: Secondary | ICD-10-CM

## 2013-10-01 LAB — POCT INR: INR: 2.3

## 2013-10-15 ENCOUNTER — Ambulatory Visit (INDEPENDENT_AMBULATORY_CARE_PROVIDER_SITE_OTHER): Payer: Commercial Managed Care - HMO | Admitting: Pharmacist Clinician (PhC)/ Clinical Pharmacy Specialist

## 2013-10-15 DIAGNOSIS — Z5181 Encounter for therapeutic drug level monitoring: Secondary | ICD-10-CM

## 2013-10-15 DIAGNOSIS — I4891 Unspecified atrial fibrillation: Secondary | ICD-10-CM

## 2013-10-15 DIAGNOSIS — Z7901 Long term (current) use of anticoagulants: Secondary | ICD-10-CM

## 2013-10-15 LAB — POCT INR: INR: 1.9

## 2013-10-28 ENCOUNTER — Other Ambulatory Visit: Payer: Self-pay | Admitting: Thoracic Surgery (Cardiothoracic Vascular Surgery)

## 2013-10-28 ENCOUNTER — Other Ambulatory Visit: Payer: Self-pay | Admitting: Cardiology

## 2013-10-29 ENCOUNTER — Ambulatory Visit (INDEPENDENT_AMBULATORY_CARE_PROVIDER_SITE_OTHER): Payer: Commercial Managed Care - HMO

## 2013-10-29 DIAGNOSIS — Z5181 Encounter for therapeutic drug level monitoring: Secondary | ICD-10-CM

## 2013-10-29 DIAGNOSIS — Z7901 Long term (current) use of anticoagulants: Secondary | ICD-10-CM

## 2013-10-29 DIAGNOSIS — I4891 Unspecified atrial fibrillation: Secondary | ICD-10-CM

## 2013-10-29 LAB — POCT INR: INR: 2.2

## 2013-11-12 ENCOUNTER — Ambulatory Visit (INDEPENDENT_AMBULATORY_CARE_PROVIDER_SITE_OTHER): Payer: Commercial Managed Care - HMO | Admitting: *Deleted

## 2013-11-12 DIAGNOSIS — I4891 Unspecified atrial fibrillation: Secondary | ICD-10-CM

## 2013-11-12 DIAGNOSIS — Z7901 Long term (current) use of anticoagulants: Secondary | ICD-10-CM

## 2013-11-12 DIAGNOSIS — Z5181 Encounter for therapeutic drug level monitoring: Secondary | ICD-10-CM

## 2013-11-12 LAB — POCT INR: INR: 2.6

## 2013-11-25 ENCOUNTER — Other Ambulatory Visit: Payer: Self-pay | Admitting: Critical Care Medicine

## 2013-11-26 NOTE — Telephone Encounter (Signed)
Last OV with PW: 11/07/12; asked to f/u in 1 yr No pending appt. lmomtcb to schedule OV with pt.  Once scheduled, will send rx to last until OV.

## 2013-11-27 NOTE — Telephone Encounter (Signed)
lmomtcb for pt to schedule OV. Given the weekend, will send rx to pharm with instructions to pls have pt call office for appt.

## 2013-12-02 ENCOUNTER — Telehealth: Payer: Self-pay | Admitting: Critical Care Medicine

## 2013-12-02 DIAGNOSIS — J449 Chronic obstructive pulmonary disease, unspecified: Secondary | ICD-10-CM

## 2013-12-02 DIAGNOSIS — R0600 Dyspnea, unspecified: Secondary | ICD-10-CM

## 2013-12-02 DIAGNOSIS — J4489 Other specified chronic obstructive pulmonary disease: Secondary | ICD-10-CM

## 2013-12-02 NOTE — Telephone Encounter (Signed)
Pt supposed to be scheduled for a year follow up with PW 10/2013 with CT prior to visit per last OV notes. Patient Instructions 11/07/12     Stay on , refills sent  We will scan your lung again in 12 months  Return 1 year   Order placed for CT scan w/o contrast  Pt aware this being ordered. Needs OV with PW to review results once received. Was unable to do this when initially spoke with pt. LMOM x 1 for pt to call back and get OV scheduled with PW  Will send to [facility] to help get patient scheduled for OV with [facility] when CT is scheduled. Thanks.

## 2013-12-02 NOTE — Telephone Encounter (Signed)
Ct scan 12/24/13@11 :30am put given to schedulers to set up appt with pew Joellen Jersey

## 2013-12-02 NOTE — Telephone Encounter (Signed)
Pt returning call.Jeffery Copeland ° °

## 2013-12-02 NOTE — Telephone Encounter (Signed)
LMTC x 1  

## 2013-12-03 ENCOUNTER — Ambulatory Visit (INDEPENDENT_AMBULATORY_CARE_PROVIDER_SITE_OTHER): Payer: Commercial Managed Care - HMO

## 2013-12-03 DIAGNOSIS — I4891 Unspecified atrial fibrillation: Secondary | ICD-10-CM

## 2013-12-03 DIAGNOSIS — Z7901 Long term (current) use of anticoagulants: Secondary | ICD-10-CM

## 2013-12-03 DIAGNOSIS — Z5181 Encounter for therapeutic drug level monitoring: Secondary | ICD-10-CM

## 2013-12-03 LAB — POCT INR: INR: 2.2

## 2013-12-23 ENCOUNTER — Other Ambulatory Visit: Payer: Self-pay | Admitting: Cardiology

## 2013-12-23 ENCOUNTER — Other Ambulatory Visit: Payer: Self-pay | Admitting: Critical Care Medicine

## 2013-12-24 ENCOUNTER — Ambulatory Visit (INDEPENDENT_AMBULATORY_CARE_PROVIDER_SITE_OTHER): Payer: Commercial Managed Care - HMO

## 2013-12-24 ENCOUNTER — Ambulatory Visit (INDEPENDENT_AMBULATORY_CARE_PROVIDER_SITE_OTHER)
Admission: RE | Admit: 2013-12-24 | Discharge: 2013-12-24 | Disposition: A | Discharge status: Home / Self Care | Payer: Commercial Managed Care - HMO | Source: Ambulatory Visit | Attending: Critical Care Medicine | Admitting: Critical Care Medicine

## 2013-12-24 DIAGNOSIS — J4489 Other specified chronic obstructive pulmonary disease: Secondary | ICD-10-CM

## 2013-12-24 DIAGNOSIS — Z5181 Encounter for therapeutic drug level monitoring: Secondary | ICD-10-CM

## 2013-12-24 DIAGNOSIS — R0600 Dyspnea, unspecified: Secondary | ICD-10-CM

## 2013-12-24 DIAGNOSIS — R0609 Other forms of dyspnea: Secondary | ICD-10-CM

## 2013-12-24 DIAGNOSIS — R0989 Other specified symptoms and signs involving the circulatory and respiratory systems: Secondary | ICD-10-CM

## 2013-12-24 DIAGNOSIS — J449 Chronic obstructive pulmonary disease, unspecified: Secondary | ICD-10-CM

## 2013-12-24 DIAGNOSIS — Z7901 Long term (current) use of anticoagulants: Secondary | ICD-10-CM

## 2013-12-24 DIAGNOSIS — I4891 Unspecified atrial fibrillation: Secondary | ICD-10-CM

## 2013-12-24 LAB — POCT INR: INR: 2.5

## 2013-12-24 NOTE — Telephone Encounter (Signed)
Last OV with PW: 11/07/12; pt was asked to f/u in 1 yr Pt does have a pending OV with PW on December 30, 2013.

## 2013-12-30 ENCOUNTER — Ambulatory Visit (INDEPENDENT_AMBULATORY_CARE_PROVIDER_SITE_OTHER): Payer: Commercial Managed Care - HMO | Admitting: Critical Care Medicine

## 2013-12-30 ENCOUNTER — Encounter: Payer: Self-pay | Admitting: Critical Care Medicine

## 2013-12-30 VITALS — BP 152/78 | HR 57 | Temp 97.4°F | Ht 66.5 in | Wt 170.6 lb

## 2013-12-30 DIAGNOSIS — Z9889 Other specified postprocedural states: Secondary | ICD-10-CM

## 2013-12-30 DIAGNOSIS — J449 Chronic obstructive pulmonary disease, unspecified: Secondary | ICD-10-CM

## 2013-12-30 DIAGNOSIS — Z87891 Personal history of nicotine dependence: Secondary | ICD-10-CM

## 2013-12-30 DIAGNOSIS — Z8679 Personal history of other diseases of the circulatory system: Secondary | ICD-10-CM

## 2013-12-30 MED ORDER — TIOTROPIUM BROMIDE MONOHYDRATE 18 MCG IN CAPS: ORAL_CAPSULE | RESPIRATORY_TRACT | Status: DC

## 2013-12-30 NOTE — Progress Notes (Signed)
  Subjective:    Patient ID: Jeffery Copeland, male    DOB: March 26, 1946, 68 y.o.   MRN: [id number]  HPI  68 y.o. [name]   12/30/2013 Chief Complaint  Patient presents with  . 12 month follow up    C/o SOB when ambulating a couple hundred feet. C/o mild cough with intermittent mucous production with white mucous. Denies CP/tightness.   Dyspnea is the same.  Notes problem going 162ft.  Min cough, mucus is white.  No chest pain. Pt denies any significant sore throat, nasal congestion or excess secretions, fever, chills, sweats, unintended weight loss, pleurtic or exertional chest pain, orthopnea PND, or leg swelling Pt denies any increase in rescue therapy over baseline, denies waking up needing it or having any early am or nocturnal exacerbations of coughing/wheezing/or dyspnea. Pt also denies any obvious fluctuation in symptoms with  weather or environmental change or other alleviating or aggravating factors  Review of Systems  Constitutional:   No  weight loss, night sweats,  Fevers, chills, fatigue, lassitude. HEENT:   No headaches,  Difficulty swallowing,  Tooth/dental problems,  Sore throat,                No sneezing, itching, ear ache, nasal congestion, post nasal drip,   CV:  No chest pain,  Orthopnea, PND, swelling in lower extremities, anasarca, dizziness, palpitations  GI  No heartburn, indigestion, abdominal pain, nausea, vomiting, diarrhea, change in bowel habits, loss of appetite  Resp: Notes shortness of breath with exertion not  at rest.  No excess mucus, no productive cough,  No non-productive cough,  No coughing up of blood.  No change in color of mucus.  No wheezing.  No chest wall deformity  Skin: no rash or lesions.  GU: no dysuria, change in color of urine, no urgency or frequency.  No flank pain.  MS:  No joint pain or swelling.  No decreased range of motion.  No back pain.  Psych:  No change in mood or affect. No depression or anxiety.  No memory loss.     Objective:    Physical Exam  Filed Vitals:   12/30/13 1140  BP: 152/78  Pulse: 57  Temp: 97.4 F (36.3 C)  TempSrc: Oral  Height: 5' 6.5" (1.689 m)  Weight: 170 lb 9.6 oz (77.384 kg)  SpO2: 98%    Gen: Pleasant, well-nourished, in no distress,  normal affect  ENT: No lesions,  mouth clear,  oropharynx clear, no postnasal drip  Neck: No JVD, no TMG, no carotid bruits  Lungs: No use of accessory muscles, no dullness to percussion, distant BS  Cardiovascular: RRR, heart sounds normal, no murmur or gallops, no peripheral edema  Abdomen: soft and NT, no HSM,  BS normal  Musculoskeletal: No deformities, no cyanosis or clubbing  Neuro: alert, non focal  Skin: Warm, no lesions or rashes      Assessment & Plan:   COPD COPD with primary emphysematous component stable at present Plan Maintain inhaled medications as   Spiriva daily

## 2013-12-30 NOTE — Patient Instructions (Addendum)
No change in medications. Return in         6 months  We will try to get you back in with Dr [name]

## 2013-12-31 NOTE — Assessment & Plan Note (Addendum)
COPD with primary emphysematous component stable at present Plan Maintain inhaled medications as   Spiriva daily

## 2014-01-20 ENCOUNTER — Other Ambulatory Visit: Payer: Self-pay | Admitting: Cardiology

## 2014-01-21 ENCOUNTER — Ambulatory Visit (INDEPENDENT_AMBULATORY_CARE_PROVIDER_SITE_OTHER): Payer: Commercial Managed Care - HMO | Admitting: *Deleted

## 2014-01-21 DIAGNOSIS — Z7901 Long term (current) use of anticoagulants: Secondary | ICD-10-CM

## 2014-01-21 DIAGNOSIS — I4891 Unspecified atrial fibrillation: Secondary | ICD-10-CM

## 2014-01-21 DIAGNOSIS — Z5181 Encounter for therapeutic drug level monitoring: Secondary | ICD-10-CM

## 2014-01-21 LAB — POCT INR: INR: 2.3

## 2014-02-04 ENCOUNTER — Ambulatory Visit (INDEPENDENT_AMBULATORY_CARE_PROVIDER_SITE_OTHER): Payer: Commercial Managed Care - HMO | Admitting: Pharmacist

## 2014-02-04 DIAGNOSIS — Z7901 Long term (current) use of anticoagulants: Secondary | ICD-10-CM

## 2014-02-04 DIAGNOSIS — I4891 Unspecified atrial fibrillation: Secondary | ICD-10-CM

## 2014-02-04 DIAGNOSIS — Z5181 Encounter for therapeutic drug level monitoring: Secondary | ICD-10-CM

## 2014-02-04 LAB — POCT INR: INR: 4.2

## 2014-02-11 ENCOUNTER — Other Ambulatory Visit (HOSPITAL_COMMUNITY): Payer: Self-pay | Admitting: Cardiology

## 2014-02-11 DIAGNOSIS — I6529 Occlusion and stenosis of unspecified carotid artery: Secondary | ICD-10-CM

## 2014-02-18 ENCOUNTER — Ambulatory Visit (INDEPENDENT_AMBULATORY_CARE_PROVIDER_SITE_OTHER): Payer: Commercial Managed Care - HMO | Admitting: Pharmacist

## 2014-02-18 ENCOUNTER — Ambulatory Visit (HOSPITAL_COMMUNITY): Payer: Medicare HMO | Attending: Internal Medicine | Admitting: Cardiology

## 2014-02-18 DIAGNOSIS — I6529 Occlusion and stenosis of unspecified carotid artery: Secondary | ICD-10-CM | POA: Diagnosis not present

## 2014-02-18 DIAGNOSIS — Z5181 Encounter for therapeutic drug level monitoring: Secondary | ICD-10-CM

## 2014-02-18 DIAGNOSIS — Z87891 Personal history of nicotine dependence: Secondary | ICD-10-CM | POA: Diagnosis not present

## 2014-02-18 DIAGNOSIS — I4891 Unspecified atrial fibrillation: Secondary | ICD-10-CM | POA: Diagnosis not present

## 2014-02-18 DIAGNOSIS — I658 Occlusion and stenosis of other precerebral arteries: Secondary | ICD-10-CM | POA: Insufficient documentation

## 2014-02-18 DIAGNOSIS — J449 Chronic obstructive pulmonary disease, unspecified: Secondary | ICD-10-CM | POA: Insufficient documentation

## 2014-02-18 DIAGNOSIS — I10 Essential (primary) hypertension: Secondary | ICD-10-CM | POA: Insufficient documentation

## 2014-02-18 DIAGNOSIS — J4489 Other specified chronic obstructive pulmonary disease: Secondary | ICD-10-CM | POA: Insufficient documentation

## 2014-02-18 DIAGNOSIS — Z7901 Long term (current) use of anticoagulants: Secondary | ICD-10-CM

## 2014-02-18 LAB — POCT INR: INR: 3.3

## 2014-02-18 NOTE — Progress Notes (Signed)
Carotid duplex performed 

## 2014-02-19 ENCOUNTER — Encounter: Payer: Self-pay | Admitting: *Deleted

## 2014-02-22 ENCOUNTER — Other Ambulatory Visit: Payer: Self-pay | Admitting: Family

## 2014-02-22 ENCOUNTER — Ambulatory Visit
Admission: RE | Admit: 2014-02-22 | Discharge: 2014-02-22 | Disposition: A | Discharge status: Home / Self Care | Payer: Commercial Managed Care - HMO | Source: Ambulatory Visit | Attending: Family | Admitting: Family

## 2014-02-22 DIAGNOSIS — T1490XA Injury, unspecified, initial encounter: Secondary | ICD-10-CM

## 2014-02-22 DIAGNOSIS — M79674 Pain in right toe(s): Secondary | ICD-10-CM

## 2014-02-24 ENCOUNTER — Other Ambulatory Visit: Payer: Self-pay | Admitting: Cardiology

## 2014-02-26 ENCOUNTER — Ambulatory Visit (INDEPENDENT_AMBULATORY_CARE_PROVIDER_SITE_OTHER): Payer: Commercial Managed Care - HMO | Admitting: Cardiology

## 2014-02-26 ENCOUNTER — Encounter: Payer: Self-pay | Admitting: Cardiology

## 2014-02-26 VITALS — BP 124/70 | HR 66 | Ht 66.0 in | Wt 164.0 lb

## 2014-02-26 DIAGNOSIS — I482 Chronic atrial fibrillation, unspecified: Secondary | ICD-10-CM

## 2014-02-26 DIAGNOSIS — Z953 Presence of xenogenic heart valve: Secondary | ICD-10-CM

## 2014-02-26 DIAGNOSIS — J449 Chronic obstructive pulmonary disease, unspecified: Secondary | ICD-10-CM

## 2014-02-26 DIAGNOSIS — I4891 Unspecified atrial fibrillation: Secondary | ICD-10-CM

## 2014-02-26 DIAGNOSIS — Z952 Presence of prosthetic heart valve: Secondary | ICD-10-CM

## 2014-02-26 DIAGNOSIS — I5032 Chronic diastolic (congestive) heart failure: Secondary | ICD-10-CM

## 2014-02-26 LAB — LIPID PANEL
CHOL/HDL RATIO: 3
Cholesterol: 120 mg/dL (ref 0–200)
HDL: 44 mg/dL (ref >39.00)
LDL CALC: 64 mg/dL (ref 0–99)
NONHDL: 76
Triglycerides: 62 mg/dL (ref 0.0–149.0)
VLDL: 12.4 mg/dL (ref 0.0–40.0)

## 2014-02-26 LAB — BASIC METABOLIC PANEL
BUN: 14 mg/dL (ref 6–23)
CHLORIDE: 99 meq/L (ref 96–112)
CO2: 28 mEq/L (ref 19–32)
Calcium: 9.2 mg/dL (ref 8.4–10.5)
Creatinine, Ser: 1.4 mg/dL (ref 0.4–1.5)
GFR: 52.26 mL/min — ABNORMAL LOW (ref >60.00)
GLUCOSE: 94 mg/dL (ref 70–99)
POTASSIUM: 5 meq/L (ref 3.5–5.1)
SODIUM: 133 meq/L — AB (ref 135–145)

## 2014-02-26 LAB — CBC WITH DIFFERENTIAL/PLATELET
Basophils Absolute: 0 10*3/uL (ref 0.0–0.1)
Basophils Relative: 0.2 % (ref 0.0–3.0)
EOS PCT: 1.1 % (ref 0.0–5.0)
Eosinophils Absolute: 0.1 10*3/uL (ref 0.0–0.7)
HCT: 37.8 % — ABNORMAL LOW (ref 39.0–52.0)
Hemoglobin: 12.5 g/dL — ABNORMAL LOW (ref 13.0–17.0)
Lymphocytes Relative: 7.8 % — ABNORMAL LOW (ref 12.0–46.0)
Lymphs Abs: 0.9 10*3/uL (ref 0.7–4.0)
MCHC: 33 g/dL (ref 30.0–36.0)
MCV: 94 fl (ref 78.0–100.0)
Monocytes Absolute: 1.1 10*3/uL — ABNORMAL HIGH (ref 0.1–1.0)
Monocytes Relative: 9 % (ref 3.0–12.0)
NEUTROS PCT: 81.9 % — AB (ref 43.0–77.0)
Neutro Abs: 9.6 10*3/uL — ABNORMAL HIGH (ref 1.4–7.7)
PLATELETS: 170 10*3/uL (ref 150.0–400.0)
RBC: 4.02 Mil/uL — AB (ref 4.22–5.81)
RDW: 14.2 % (ref 11.5–15.5)
WBC: 11.7 10*3/uL — AB (ref 4.0–10.5)

## 2014-02-26 NOTE — Patient Instructions (Signed)
You can stop aspirin.   Your physician recommends that you have lab today--lipid profile/CBCd/BMET.  Your physician wants you to follow-up in: 6 months with Dr [name]. (January 2016). You will receive a reminder letter in the mail two months in advance. If you don't receive a letter, please call our office to schedule the follow-up appointment.

## 2014-02-28 ENCOUNTER — Emergency Department (HOSPITAL_COMMUNITY): Payer: Medicare HMO

## 2014-02-28 ENCOUNTER — Emergency Department (HOSPITAL_COMMUNITY)
Admission: EM | Admit: 2014-02-28 | Discharge: 2014-02-28 | Disposition: A | Discharge status: Home / Self Care | Payer: Medicare HMO | Attending: Emergency Medicine | Admitting: Emergency Medicine

## 2014-02-28 ENCOUNTER — Encounter (HOSPITAL_COMMUNITY): Payer: Self-pay | Admitting: Emergency Medicine

## 2014-02-28 DIAGNOSIS — Z8673 Personal history of transient ischemic attack (TIA), and cerebral infarction without residual deficits: Secondary | ICD-10-CM | POA: Insufficient documentation

## 2014-02-28 DIAGNOSIS — G40909 Epilepsy, unspecified, not intractable, without status epilepticus: Secondary | ICD-10-CM | POA: Diagnosis not present

## 2014-02-28 DIAGNOSIS — Z79899 Other long term (current) drug therapy: Secondary | ICD-10-CM | POA: Diagnosis not present

## 2014-02-28 DIAGNOSIS — Z87891 Personal history of nicotine dependence: Secondary | ICD-10-CM | POA: Diagnosis not present

## 2014-02-28 DIAGNOSIS — J449 Chronic obstructive pulmonary disease, unspecified: Secondary | ICD-10-CM | POA: Insufficient documentation

## 2014-02-28 DIAGNOSIS — J4489 Other specified chronic obstructive pulmonary disease: Secondary | ICD-10-CM | POA: Insufficient documentation

## 2014-02-28 DIAGNOSIS — Z7901 Long term (current) use of anticoagulants: Secondary | ICD-10-CM | POA: Diagnosis not present

## 2014-02-28 DIAGNOSIS — Z954 Presence of other heart-valve replacement: Secondary | ICD-10-CM | POA: Diagnosis not present

## 2014-02-28 DIAGNOSIS — R569 Unspecified convulsions: Secondary | ICD-10-CM

## 2014-02-28 DIAGNOSIS — Z862 Personal history of diseases of the blood and blood-forming organs and certain disorders involving the immune mechanism: Secondary | ICD-10-CM | POA: Diagnosis not present

## 2014-02-28 DIAGNOSIS — I10 Essential (primary) hypertension: Secondary | ICD-10-CM | POA: Diagnosis not present

## 2014-02-28 DIAGNOSIS — K219 Gastro-esophageal reflux disease without esophagitis: Secondary | ICD-10-CM | POA: Insufficient documentation

## 2014-02-28 HISTORY — DX: Unspecified convulsions: R56.9

## 2014-02-28 LAB — CBC WITH DIFFERENTIAL/PLATELET
Basophils Absolute: 0 10*3/uL (ref 0.0–0.1)
Basophils Relative: 0 % (ref 0–1)
Eosinophils Absolute: 0 10*3/uL (ref 0.0–0.7)
Eosinophils Relative: 0 % (ref 0–5)
HEMATOCRIT: 32.5 % — AB (ref 39.0–52.0)
Hemoglobin: 10.9 g/dL — ABNORMAL LOW (ref 13.0–17.0)
LYMPHS PCT: 6 % — AB (ref 12–46)
Lymphs Abs: 0.6 10*3/uL — ABNORMAL LOW (ref 0.7–4.0)
MCH: 30.4 pg (ref 26.0–34.0)
MCHC: 33.5 g/dL (ref 30.0–36.0)
MCV: 90.5 fL (ref 78.0–100.0)
MONO ABS: 1.3 10*3/uL — AB (ref 0.1–1.0)
MONOS PCT: 12 % (ref 3–12)
NEUTROS ABS: 9.1 10*3/uL — AB (ref 1.7–7.7)
Neutrophils Relative %: 82 % — ABNORMAL HIGH (ref 43–77)
Platelets: 151 10*3/uL (ref 150–400)
RBC: 3.59 MIL/uL — ABNORMAL LOW (ref 4.22–5.81)
RDW: 13.4 % (ref 11.5–15.5)
WBC: 11.1 10*3/uL — AB (ref 4.0–10.5)

## 2014-02-28 LAB — COMPREHENSIVE METABOLIC PANEL
ALT: 11 U/L (ref 0–53)
ANION GAP: 13 (ref 5–15)
AST: 17 U/L (ref 0–37)
Albumin: 3.8 g/dL (ref 3.5–5.2)
Alkaline Phosphatase: 98 U/L (ref 39–117)
BILIRUBIN TOTAL: 0.8 mg/dL (ref 0.3–1.2)
BUN: 14 mg/dL (ref 6–23)
CHLORIDE: 92 meq/L — AB (ref 96–112)
CO2: 21 meq/L (ref 19–32)
Calcium: 8.6 mg/dL (ref 8.4–10.5)
Creatinine, Ser: 1.19 mg/dL (ref 0.50–1.35)
GFR, EST AFRICAN AMERICAN: 71 mL/min — AB (ref >90)
GFR, EST NON AFRICAN AMERICAN: 61 mL/min — AB (ref >90)
GLUCOSE: 125 mg/dL — AB (ref 70–99)
Potassium: 4.5 mEq/L (ref 3.7–5.3)
Sodium: 126 mEq/L — ABNORMAL LOW (ref 137–147)
Total Protein: 6.4 g/dL (ref 6.0–8.3)

## 2014-02-28 LAB — PROTIME-INR
INR: 3.9 — AB (ref 0.00–1.49)
Prothrombin Time: 38.2 seconds — ABNORMAL HIGH (ref 11.6–15.2)

## 2014-02-28 MED ORDER — PHENYTOIN SODIUM 50 MG/ML IJ SOLN
1200.0000 mg | Freq: Once | INTRAMUSCULAR | Status: AC
Start: 2014-02-28 — End: 2014-02-28
  Administered 2014-02-28: 1200 mg via INTRAVENOUS
  Filled 2014-02-28: qty 24

## 2014-02-28 MED ORDER — PHENYTOIN SODIUM EXTENDED 100 MG PO CAPS: 100.0000 mg | ORAL_CAPSULE | Freq: Three times a day (TID) | ORAL | Status: DC

## 2014-02-28 NOTE — ED Notes (Signed)
Pt to ED via [facility] after reported having a seizure x's 2 today while sitting in a recliner on front porch.   CBG by EMS 137.  Pt st's he has had seizures in the past but does not take any medications for same.

## 2014-02-28 NOTE — ED Notes (Signed)
Pt returned from CT, placed back on cardiac monitor, b/P and pulsde ox.

## 2014-02-28 NOTE — ED Notes (Signed)
Pt to CT at this time.

## 2014-02-28 NOTE — Progress Notes (Signed)
Patient ID: Jeffery Copeland, male   DOB: November 01, 1945, 68 y.o.   MRN: [id number] PCP: Dr. 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  He seems to have done well since his redo MVR.  Echo in 10/14 showed EF 55-60% with normal biorprosthetic mitral valve.  He is breathing better than before surgery.  He walks around [place] with no problems.  No chest pain, lightheadedness, or syncope.  No orthopnea/PND.  Weight is stable.  ECG: Atrial fibrillation  Labs (4/14): K 4.1, creatinine 1.3 Labs (10/14): K 4.5, creatinine 1.3  PMH: 1. Fe deficiency anemia 2. 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  Echo (2/14) with EF 55-60%, bioprosthetic mitral valve with mean gradient 8 mmHg, no definite MR noted though there was significant shadowing, severe LAE.  TEE after 2/14 echo showed severe mitral regurgitation involving the bioprosthetic MV.  Patient had right mini-thoracotomy with redo MVR (bioprosthetic) in 8/14.  Echo (10/14) showed EF 55-60% with normal bioprosthetic mitral valve and PA systolic pressure 32 mmHg.  3. COPD: Followed by Dr. Joya Gaskins. 4. GERD 5. Lung nodules: LLL nodule removed in 2008 by left lower lobectomy.  RLL nodule, currently observing.  6. Atrial fibrillation: Chronic, on coumadin. 7. Chronic diastolic CHF 8. LHC (7/14) with no significant CAD.   SH: Divorced, lives alone in [place].  Quit smoking in 2006.    FH: No premature CAD.  ROS: All systems reviewed and negative except as per HPI.   Current Outpatient Prescriptions  Medication Sig Dispense Refill  . Cholecalciferol (VITAMIN D3) 1000 UNITS CAPS Take 1 capsule by mouth daily.       Marland Kitchen ezetimibe (ZETIA) 10 MG tablet Take 1 tablet (10 mg total) by mouth daily.  30  tablet  6  . methocarbamol (ROBAXIN) 500 MG tablet Take 500 mg by mouth 2 (two) times daily.      . metoprolol tartrate (LOPRESSOR) 25 MG tablet TAKE 1/2 TABLET BY MOUTH TWO TIMES A DAY  30 tablet  0  . omeprazole (PRILOSEC) 20 MG capsule Take 20 mg by mouth daily.       Marland Kitchen oxyCODONE (OXY IR/ROXICODONE) 5 MG immediate release tablet Take 1 tablet by mouth every 6 (six) hours as needed.      . ramipril (ALTACE) 5 MG capsule TAKE 1 CAPSULE (5 MG TOTAL) BY MOUTH DAILY.  30 capsule  3  . ramipril (ALTACE) 5 MG capsule TAKE 1 CAPSULE (5 MG TOTAL) BY MOUTH DAILY.  30 capsule  0  . tiotropium (SPIRIVA HANDIHALER) 18 MCG inhalation capsule PLACE 1 CAPSULE INTO INHALER & INHALE DAILY  30 capsule  11  . warfarin (COUMADIN) 5 MG tablet Take as directed by anticoagulation clinic  100 tablet  1  . zolpidem (AMBIEN) 5 MG tablet Take 5 mg by mouth at bedtime as needed. For insomnia.       No current facility-administered medications for this visit.    BP 124/70  Pulse 66  Ht 5\' 6"  (1.676 m)  Wt 164 lb (74.39 kg)  BMI 26.48 kg/m2 General: NAD Neck: No JVD, no thyromegaly or thyroid nodule.  Lungs: Clear to auscultation bilaterally with normal respiratory effort. CV: Nondisplaced PMI.  Heart regular S1/S2, 1/6 SEM RUSB.  No peripheral edema.  No carotid bruit.  Normal  pedal pulses.  Abdomen: Soft, nontender, no hepatosplenomegaly, no distention.  Skin: Intact without lesions or rashes.  Neurologic: Alert and oriented x 3.  Psych: Normal affect. Extremities: No clubbing or cyanosis.   Assessment/Plan:  1. Bioprosthetic mitral valve:  Redo MVR in 8/14.  Valve looked good on 10/14 echo.  He seems to be doing well overall.   2. Atrial fibrillation: Chronic, good rate control on current dose of metoprolol.  Continue anticoagulation with coumadin given valvular atrial fibrillation. Will check CBC today.  He can stop ASA given coumadin use.  3. Chronic diastolic CHF: He is not volume overloaded on exam. He is  now off Lasix.   4. COPD: Stable, he is on Spiriva.  5. Will check lipids.   Loralie Champagne 02/28/2014 1:06 PM
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
## 2014-02-28 NOTE — ED Notes (Signed)
Pt resting.  Remains alert and oriented x's 3.  No seizure activity noted.  Seizure pads remain on stretcher

## 2014-02-28 NOTE — Discharge Instructions (Signed)
Take Dilantin as prescribed All neurology for a followup appointment No driving until cleared by neurology.   Seizure, Adult A seizure is abnormal electrical activity in the brain. Seizures usually last from 30 seconds to 2 minutes. There are various types of seizures. Before a seizure, you may have a warning sensation (aura) that a seizure is about to occur. An aura may include the following symptoms:   Fear or anxiety.  Nausea.  Feeling like the room is spinning (vertigo).  Vision changes, such as seeing flashing lights or spots. Common symptoms during a seizure include:  A change in attention or behavior (altered mental status).  Convulsions with rhythmic jerking movements.  Drooling.  Rapid eye movements.  Grunting.  Loss of bladder and bowel control.  Bitter taste in the mouth.  Tongue biting. After a seizure, you may feel confused and sleepy. You may also have an injury resulting from convulsions during the seizure. HOME CARE INSTRUCTIONS   If you are given medicines, take them exactly as prescribed by your health care provider.  Keep all follow-up appointments as directed by your health care provider.  Do not swim or drive or engage in risky activity during which a seizure could cause further injury to you or others until your health care provider says it is OK.  Get adequate rest.  Teach friends and family what to do if you have a seizure. They should:  Lay you on the ground to prevent a fall.  Put a cushion under your head.  Loosen any tight clothing around your neck.  Turn you on your side. If vomiting occurs, this helps keep your airway clear.  Stay with you until you recover.  Know whether or not you need emergency care. SEEK IMMEDIATE MEDICAL CARE IF:  The seizure lasts longer than 5 minutes.  The seizure is severe or you do not wake up immediately after the seizure.  You have an altered mental status after the seizure.  You are having  more frequent or worsening seizures. Someone should drive you to the emergency department or call local emergency services (911 in [place] MAKE SURE YOU:  Understand these instructions.  Will watch your condition.  Will get help right away if you are not doing well or get worse. Document Released: 07/13/2000 Document Revised: 05/06/2013 Document Reviewed: 02/25/2013 [facility] Patient Information 2015 [place], [place]. This information is not intended to replace advice given to you by your health care provider. Make sure you discuss any questions you have with your health care provider.

## 2014-03-09 ENCOUNTER — Encounter (INDEPENDENT_AMBULATORY_CARE_PROVIDER_SITE_OTHER): Payer: Self-pay

## 2014-03-09 ENCOUNTER — Ambulatory Visit (INDEPENDENT_AMBULATORY_CARE_PROVIDER_SITE_OTHER): Payer: Commercial Managed Care - HMO | Admitting: Surgery

## 2014-03-09 ENCOUNTER — Encounter (INDEPENDENT_AMBULATORY_CARE_PROVIDER_SITE_OTHER): Payer: Self-pay | Admitting: Surgery

## 2014-03-09 VITALS — BP 118/84 | HR 76 | Temp 98.0°F | Resp 18 | Ht 69.0 in | Wt 163.0 lb

## 2014-03-09 DIAGNOSIS — K429 Umbilical hernia without obstruction or gangrene: Secondary | ICD-10-CM

## 2014-03-09 DIAGNOSIS — K439 Ventral hernia without obstruction or gangrene: Secondary | ICD-10-CM

## 2014-03-09 NOTE — Addendum Note (Signed)
Addended by: Maia Petties on: 03/09/2014 10:00 AM   Modules accepted: Orders

## 2014-03-09 NOTE — Progress Notes (Signed)
Patient ID: Jeffery Copeland, male   DOB: [birth year], 68 y.o.   MRN: [id number]  Chief Complaint  Patient presents with  . New Evaluation    eval umbilical hernia    HPI Jeffery Copeland is a 68 y.o. male.  Referred by [name]  Cardiology [name] HPI This is a 68 year old male with multiple medical comorbidities who presents with a six-month history of a visible, palpable bulge at his umbilicus. He does not remember any single incident that caused this hernia to form but he has noticed that it has become larger and more uncomfortable. When he is supine, this remains reducible. He denies any difficulties with digestion or bowel movements. His primary care provider has referred him for evaluation. He did see his cardiologist last week. The patient also has had occasional seizures and had one several days ago. He was evaluated in the emergency department and was referred to neurology. He has not yet seen neurology since the last seizure. That appointment is pending.   Past Medical History  Diagnosis Date  . 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 Cerebrovascular disease, unspecified   . Esophageal reflux   . Generalized osteoarthrosis, unspecified site   . Hypertension   . COPD (chronic obstructive pulmonary disease) 1999  . Lung nodules 2008, 2014    LLL nodule removed 2008 with LLL lobectomy,  RLL nodule 28mm observation  . Atrial fibrillation 11/11/2008    Qualifier: Diagnosis of  By: Lia Foyer, MD, Jaquelyn Bitter   . CARDIOMYOPATHY 10/20/2008    Qualifier: Diagnosis of  By: Owens Shark, RN, BSN, Lauren    . ISCHEMIC COLITIS, HX OF 10/20/2008    Qualifier: Diagnosis of  By: Owens Shark, RN, BSN, Lauren    . S/P mitral valve replacement 02/01/1998    Carpentier-Edwards porcine bioprosthetic tissue valve, size 25mm Placed for acute and subacute bacterial endocarditis (group G Streptococcus) with pre-existing mitral valve prolapse   . HYPERTENSION, BENIGN 10/20/2008     Qualifier: Diagnosis of  By: Owens Shark, RN, BSN, Lauren    . Epidural hematoma 03/01/2011    Recent fall 2012   . Severe mitral regurgitation 02/20/2013  . Iron deficiency anemia, unspecified   . S/P mitral valve replacement with bioprosthetic valve 03/24/2013    Redo mitral valve replacement using 32mm [facility] mitral bovine bioprosthetic tissue valve performed via right mini thoracotomy  . Seizures     Past Surgical History  Procedure Laterality Date  . Mitral valve replacement  02/01/1998    Carpentier-Edwards porcine bioprosthetic tissue valve, size 101mm, placed for complicated bacterial endocarditis  . Video assisted thoracoscopy  04/29/2007    Left VATS w/ mini thoracotomy for Left Lower Lobectomy for benign lung nodules  . Tee without cardioversion N/A 02/12/2013    Procedure: TRANSESOPHAGEAL ECHOCARDIOGRAM (TEE);  Surgeon: Fay Records, MD;  Location: [facility] ENDOSCOPY;  Service: Cardiovascular;  Laterality: N/A;  . Mitral valve replacement Right 03/24/2013    Procedure: MINIMALLY INVASIVE REDO MITRAL VALVE (MV) REPLACEMENT;  Surgeon: Rexene Alberts, MD;  Location: [place];  Service: Open Heart Surgery;  Laterality: Right;  . Minimally invasive maze procedure N/A 03/24/2013    Procedure: MINIMALLY INVASIVE MAZE PROCEDURE;  Surgeon: Rexene Alberts, MD;  Location: [place];  Service: Open Heart Surgery;  Laterality: N/A;  . Intraoperative transesophageal echocardiogram N/A 03/24/2013    Procedure: INTRAOPERATIVE TRANSESOPHAGEAL ECHOCARDIOGRAM;  Surgeon: Rexene Alberts, MD;  Location: [place];  Service: Open  Heart Surgery;  Laterality: N/A;    Family History  Problem Relation Age of Onset  . Heart disease Mother   . Other Father     prostate issues    Social History History  Substance Use Topics  . Smoking status: Former Smoker -- 2.00 packs/day for 40 years    Types: Cigarettes    Quit date: 07/30/2004  . Smokeless tobacco: Never Used  . Alcohol Use: No    Allergies  Allergen  Reactions  . Rosuvastatin Other (See Comments)    Muscle aches    Current Outpatient Prescriptions  Medication Sig Dispense Refill  . Cholecalciferol (VITAMIN D3) 1000 UNITS CAPS Take 1 capsule by mouth daily.       Marland Kitchen ezetimibe (ZETIA) 10 MG tablet Take 1 tablet (10 mg total) by mouth daily.  30 tablet  6  . methocarbamol (ROBAXIN) 500 MG tablet Take 500 mg by mouth 2 (two) times daily.      . metoprolol tartrate (LOPRESSOR) 25 MG tablet TAKE 1/2 TABLET BY MOUTH TWO TIMES A DAY  30 tablet  0  . omeprazole (PRILOSEC) 20 MG capsule Take 20 mg by mouth daily.       Marland Kitchen oxyCODONE (OXY IR/ROXICODONE) 5 MG immediate release tablet Take 1 tablet by mouth every 6 (six) hours as needed.      . phenytoin (DILANTIN) 100 MG ER capsule Take 1 capsule (100 mg total) by mouth 3 (three) times daily.  90 capsule  0  . ramipril (ALTACE) 5 MG capsule TAKE 1 CAPSULE (5 MG TOTAL) BY MOUTH DAILY.  30 capsule  3  . tiotropium (SPIRIVA HANDIHALER) 18 MCG inhalation capsule PLACE 1 CAPSULE INTO INHALER & INHALE DAILY  30 capsule  11  . warfarin (COUMADIN) 5 MG tablet Take as directed by anticoagulation clinic  100 tablet  1  . zolpidem (AMBIEN) 5 MG tablet Take 5 mg by mouth at bedtime as needed. For insomnia.      . ramipril (ALTACE) 5 MG capsule TAKE 1 CAPSULE (5 MG TOTAL) BY MOUTH DAILY.  30 capsule  0   No current facility-administered medications for this visit.    Review of Systems Review of Systems  Constitutional: Positive for activity change. Negative for fever, chills and unexpected weight change.  HENT: Negative for congestion, hearing loss, sore throat, trouble swallowing and voice change.   Eyes: Negative for visual disturbance.  Respiratory: Negative for cough and wheezing.   Cardiovascular: Negative for chest pain, palpitations and leg swelling.  Gastrointestinal: Positive for abdominal pain and abdominal distention. Negative for nausea, vomiting, diarrhea, constipation, blood in stool, anal  bleeding and rectal pain.  Genitourinary: Negative for hematuria and difficulty urinating.  Musculoskeletal: Positive for arthralgias, back pain, gait problem and neck pain.  Skin: Negative for rash and wound.  Neurological: Positive for seizures. Negative for syncope, weakness and headaches.  Hematological: Negative for adenopathy. Does not bruise/bleed easily.  Psychiatric/Behavioral: Negative for confusion.    Blood pressure 118/84, pulse 76, temperature 98 F (36.7 C), resp. rate 18, height 5\' 9"  (1.753 m), weight 163 lb (73.936 kg).  Physical Exam Physical Exam WDWN in NAD HEENT:  EOMI, sclera anicteric Neck:  No masses, no thyromegaly; limited ROM Lungs:  CTA bilaterally; normal respiratory effort CV:  irregular rate and rhythm; no murmurs Abd:  +bowel sounds, soft, non-tender; visible partially reducible umbilical hernia; he also has a small epigastric ventral hernia about 6 cm above the umbilicus - partially reducible Ext:  Well-perfused;  no edema Skin:  Warm, dry; no sign of jaundice  Data Reviewed CT angio chest/ abd/ pelvis from 03/13/13 - these hernias were visible then, but were much smaller  Assessment    Epigastric ventral hernia/ umbilical hernia - reducible Multiple cardiac comorbidities - anticoagulated Recent seizure     Plan    Cardiac clearance by Dr. [name] Neurologic clearance Recommend open repair of both of these hernias with mesh.  The surgical procedure has been discussed with the patient.  Potential risks, benefits, alternative treatments, and expected outcomes have been explained.  All of the patient's questions at this time have been answered.  The likelihood of reaching the patient's treatment goal is good.  The patient understand the proposed surgical procedure and wishes to proceed.         Wanna Gully K. 03/09/2014, 9:42 AM

## 2014-03-11 ENCOUNTER — Ambulatory Visit (INDEPENDENT_AMBULATORY_CARE_PROVIDER_SITE_OTHER): Payer: Commercial Managed Care - HMO | Admitting: Pharmacist Clinician (PhC)/ Clinical Pharmacy Specialist

## 2014-03-11 DIAGNOSIS — Z5181 Encounter for therapeutic drug level monitoring: Secondary | ICD-10-CM | POA: Diagnosis not present

## 2014-03-11 DIAGNOSIS — Z7901 Long term (current) use of anticoagulants: Secondary | ICD-10-CM

## 2014-03-11 DIAGNOSIS — I4891 Unspecified atrial fibrillation: Secondary | ICD-10-CM | POA: Diagnosis not present

## 2014-03-11 LAB — POCT INR: INR: 4.9

## 2014-03-12 ENCOUNTER — Telehealth: Payer: Self-pay | Admitting: Pharmacist

## 2014-03-12 NOTE — Telephone Encounter (Signed)
Patient will need to have hernia repair surgery (x2) by Dr. Georgette Dover in the future.  Dr. [name] is okay with patient holding warfarin prior to procedure, but Dr. [name] would like patient to have a lovenox bridge given he has a very large left atrium and a bioprosthetic mitral valve.  Patient has been notified and will call us once he knows the date of procedure so we can schedule him to come in for lovenox education.  Webb Silversmith, please make sure Dr. Georgette Dover is aware of Dr. Claris Gladden "okay" for holding warfarin.

## 2014-03-15 NOTE — Telephone Encounter (Signed)
Will forward to Dr Georgette Dover

## 2014-03-15 NOTE — Telephone Encounter (Signed)
Tonya,  I have noted this cardiac clearance, but the patient still needs to see neurology about his seizures before scheduling surgery.  Thanks  Sheyenne Konz

## 2014-03-15 NOTE — Telephone Encounter (Signed)
Will contact pt to make sure that he has made appt to see a neurologist. [facility] for sister to contact us back regarding this matter.

## 2014-03-25 ENCOUNTER — Other Ambulatory Visit: Payer: Self-pay | Admitting: Cardiology

## 2014-03-25 ENCOUNTER — Ambulatory Visit (INDEPENDENT_AMBULATORY_CARE_PROVIDER_SITE_OTHER): Payer: Commercial Managed Care - HMO | Admitting: *Deleted

## 2014-03-25 DIAGNOSIS — I4891 Unspecified atrial fibrillation: Secondary | ICD-10-CM | POA: Diagnosis not present

## 2014-03-25 DIAGNOSIS — Z7901 Long term (current) use of anticoagulants: Secondary | ICD-10-CM | POA: Diagnosis not present

## 2014-03-25 DIAGNOSIS — Z5181 Encounter for therapeutic drug level monitoring: Secondary | ICD-10-CM | POA: Diagnosis not present

## 2014-03-25 LAB — POCT INR: INR: 4

## 2014-04-08 ENCOUNTER — Ambulatory Visit (INDEPENDENT_AMBULATORY_CARE_PROVIDER_SITE_OTHER): Payer: Commercial Managed Care - HMO | Admitting: Pharmacist

## 2014-04-08 DIAGNOSIS — Z5181 Encounter for therapeutic drug level monitoring: Secondary | ICD-10-CM

## 2014-04-08 DIAGNOSIS — Z7901 Long term (current) use of anticoagulants: Secondary | ICD-10-CM | POA: Diagnosis not present

## 2014-04-08 DIAGNOSIS — I4891 Unspecified atrial fibrillation: Secondary | ICD-10-CM | POA: Diagnosis not present

## 2014-04-08 LAB — POCT INR: INR: 1.8

## 2014-04-12 ENCOUNTER — Encounter: Payer: Self-pay | Admitting: Thoracic Surgery (Cardiothoracic Vascular Surgery)

## 2014-04-12 ENCOUNTER — Ambulatory Visit (INDEPENDENT_AMBULATORY_CARE_PROVIDER_SITE_OTHER): Payer: Commercial Managed Care - HMO | Admitting: Thoracic Surgery (Cardiothoracic Vascular Surgery)

## 2014-04-12 VITALS — BP 135/83 | HR 66 | Resp 16 | Ht 66.0 in | Wt 164.0 lb

## 2014-04-12 DIAGNOSIS — I340 Nonrheumatic mitral (valve) insufficiency: Secondary | ICD-10-CM

## 2014-04-12 DIAGNOSIS — Z9889 Other specified postprocedural states: Secondary | ICD-10-CM

## 2014-04-12 DIAGNOSIS — Z953 Presence of xenogenic heart valve: Secondary | ICD-10-CM

## 2014-04-12 DIAGNOSIS — I4891 Unspecified atrial fibrillation: Secondary | ICD-10-CM

## 2014-04-12 DIAGNOSIS — Z952 Presence of prosthetic heart valve: Secondary | ICD-10-CM

## 2014-04-12 DIAGNOSIS — I059 Rheumatic mitral valve disease, unspecified: Secondary | ICD-10-CM

## 2014-04-12 DIAGNOSIS — Z8679 Personal history of other diseases of the circulatory system: Secondary | ICD-10-CM

## 2014-04-12 NOTE — Progress Notes (Signed)
[address]       [place] [address]             [phone]     CARDIOTHORACIC SURGERY OFFICE NOTE  Referring Provider is Larey Dresser, MD PCP is Salena Saner., MD   HPI:  Patient returns for routine followup status post minimally invasive redo mitral valve replacement using a bioprosthetic tissue valve on 03/24/2013.  He was last seen here in our office on 09/28/2013. Since then he has been seen in followup by Dr. [name]. He returns to the office today reporting that he continues to do fairly well. He has stable symptoms of chronic exertional shortness of breath. He states that his shortness of breath only occurs with relatively strenuous activity and it does not limit his physical activities to any significant degree.  Overall he feels much better than he did prior to surgery last year. His not having any chest pain either with activity or at rest. He is not having any tachypalpitations or dizzy spells.    Current Outpatient Prescriptions  Medication Sig Dispense Refill  . Cholecalciferol (VITAMIN D3) 1000 UNITS CAPS Take 1 capsule by mouth daily.       Marland Kitchen ezetimibe (ZETIA) 10 MG tablet Take 1 tablet (10 mg total) by mouth daily.  30 tablet  6  . methocarbamol (ROBAXIN) 500 MG tablet Take 500 mg by mouth 2 (two) times daily.      . metoprolol tartrate (LOPRESSOR) 25 MG tablet TAKE 1/2 TABLET BY MOUTH TWICE A DAY  30 tablet  4  . omeprazole (PRILOSEC) 20 MG capsule Take 20 mg by mouth daily.       Marland Kitchen oxyCODONE (OXY IR/ROXICODONE) 5 MG immediate release tablet Take 1 tablet by mouth every 6 (six) hours as needed.      . ramipril (ALTACE) 5 MG capsule TAKE ONE CAPSULE BY MOUTH EVERY DAY  30 capsule  4  . tiotropium (SPIRIVA HANDIHALER) 18 MCG inhalation capsule PLACE 1 CAPSULE INTO INHALER & INHALE DAILY  30 capsule  11  . warfarin (COUMADIN) 5 MG tablet Take as directed by anticoagulation clinic  100 tablet  1  . zolpidem (AMBIEN) 5 MG tablet Take 5 mg  by mouth at bedtime as needed. For insomnia.      Marland Kitchen phenytoin (DILANTIN) 100 MG ER capsule Take 1 capsule (100 mg total) by mouth 3 (three) times daily.  90 capsule  0   No current facility-administered medications for this visit.      Physical Exam:   BP 135/83  Pulse 66  Resp 16  Ht 5\' 6"  (1.676 m)  Wt 164 lb (74.39 kg)  BMI 26.48 kg/m2  SpO2 98%  General:  Well-appearing  Chest:   clear  CV:   Regular rate and rhythm without murmur  Incisions:  Completely healed  Abdomen:  Soft and nontender  Extremities:  Warm and well-perfused  Diagnostic Tests:  2 channel telemetry rhythm strip demonstrates normal sinus rhythm   Impression:  Patient is doing well approximately 1 year following minimally invasive redo mitral valve replacement using a bioprosthetic tissue valve. He is currently maintaining sinus rhythm and describes stable symptoms of exertional shortness of breath, NYHA functional class II.   Plan:  Patient will return as needed in the future. He will continue to followup with Dr. [name]. All of his questions been addressed.  I spent in excess of 15 minutes during the conduct of this office consultation  and >50% of this time involved direct face-to-face encounter with the patient for counseling and/or coordination of their care.   Valentina Gu. Roxy Manns, MD 04/12/2014 1:38 PM

## 2014-05-13 ENCOUNTER — Encounter (INDEPENDENT_AMBULATORY_CARE_PROVIDER_SITE_OTHER): Payer: Self-pay

## 2014-05-13 ENCOUNTER — Encounter: Payer: Self-pay | Admitting: Neurology

## 2014-05-13 ENCOUNTER — Ambulatory Visit (INDEPENDENT_AMBULATORY_CARE_PROVIDER_SITE_OTHER): Payer: Commercial Managed Care - HMO | Admitting: Neurology

## 2014-05-13 VITALS — BP 146/82 | HR 69 | Ht 65.75 in | Wt 167.0 lb

## 2014-05-13 DIAGNOSIS — R55 Syncope and collapse: Secondary | ICD-10-CM

## 2014-05-13 DIAGNOSIS — R569 Unspecified convulsions: Secondary | ICD-10-CM

## 2014-05-13 MED ORDER — PHENYTOIN SODIUM EXTENDED 100 MG PO CAPS: 300.0000 mg | ORAL_CAPSULE | Freq: Every day | ORAL | Status: DC

## 2014-05-13 NOTE — Patient Instructions (Addendum)
I had a long discussion with the patient with regards to his possible seizure and given his prior history of remote seizures it would be prudent to start him on Dilantin for seizure prophylaxis. Check EEG and MRI scan of the brain and MRAs. He was given a refill for Dilantin 300 mg daily. I advised him to avoid alcohol, sleep deprivation and other seizure provoking triggers. Return for followup in 2 months or call earlier if necessary he was advised not to drive for the 6 months per [facility] law  Seizure, Adult A seizure is abnormal electrical activity in the brain. Seizures usually last from 30 seconds to 2 minutes. There are various types of seizures. Before a seizure, you may have a warning sensation (aura) that a seizure is about to occur. An aura may include the following symptoms:   Fear or anxiety.  Nausea.  Feeling like the room is spinning (vertigo).  Vision changes, such as seeing flashing lights or spots. Common symptoms during a seizure include:  A change in attention or behavior (altered mental status).  Convulsions with rhythmic jerking movements.  Drooling.  Rapid eye movements.  Grunting.  Loss of bladder and bowel control.  Bitter taste in the mouth.  Tongue biting. After a seizure, you may feel confused and sleepy. You may also have an injury resulting from convulsions during the seizure. HOME CARE INSTRUCTIONS   If you are given medicines, take them exactly as prescribed by your health care provider.  Keep all follow-up appointments as directed by your health care provider.  Do not swim or drive or engage in risky activity during which a seizure could cause further injury to you or others until your health care provider says it is OK.  Get adequate rest.  Teach friends and family what to do if you have a seizure. They should:  Lay you on the ground to prevent a fall.  Put a cushion under your head.  Loosen any tight clothing around your  neck.  Turn you on your side. If vomiting occurs, this helps keep your airway clear.  Stay with you until you recover.  Know whether or not you need emergency care. SEEK IMMEDIATE MEDICAL CARE IF:  The seizure lasts longer than 5 minutes.  The seizure is severe or you do not wake up immediately after the seizure.  You have an altered mental status after the seizure.  You are having more frequent or worsening seizures. Someone should drive you to the emergency department or call local emergency services (911 in [place] MAKE SURE YOU:  Understand these instructions.  Will watch your condition.  Will get help right away if you are not doing well or get worse. Document Released: 07/13/2000 Document Revised: 05/06/2013 Document Reviewed: 02/25/2013 [facility] Patient Information 2015 [place], [place]. This information is not intended to replace advice given to you by your health care provider. Make sure you discuss any questions you have with your health care provider.
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
## 2014-05-17 ENCOUNTER — Ambulatory Visit (INDEPENDENT_AMBULATORY_CARE_PROVIDER_SITE_OTHER): Payer: Commercial Managed Care - HMO | Admitting: *Deleted

## 2014-05-17 ENCOUNTER — Telehealth: Payer: Self-pay

## 2014-05-17 DIAGNOSIS — Z7901 Long term (current) use of anticoagulants: Secondary | ICD-10-CM

## 2014-05-17 DIAGNOSIS — I4891 Unspecified atrial fibrillation: Secondary | ICD-10-CM

## 2014-05-17 DIAGNOSIS — Z5181 Encounter for therapeutic drug level monitoring: Secondary | ICD-10-CM

## 2014-05-17 LAB — POCT INR: INR: 2.8

## 2014-05-17 MED ORDER — WARFARIN SODIUM 5 MG PO TABS: ORAL_TABLET | ORAL | Status: DC

## 2014-05-17 MED ORDER — WARFARIN SODIUM 5 MG PO TABS
ORAL_TABLET | ORAL | Status: DC
Start: 2014-05-17 — End: 2014-05-17

## 2014-05-17 NOTE — Telephone Encounter (Signed)
Pt overdue for Coumadin follow-up.  Pt seen in clinic today.  Refill authorized x 1 only secondary to noncompliance.

## 2014-05-21 ENCOUNTER — Telehealth: Payer: Self-pay | Admitting: Neurology

## 2014-05-21 NOTE — Telephone Encounter (Signed)
Kim with [facility], Dr.Matthew Tseui @ [phone], requesting a return call regarding letter of clearance for surgery.

## 2014-05-24 NOTE — Telephone Encounter (Signed)
Called Kim back at [facility], left message for her to return my call, will need to know the type of surgery patient will be having and will the anesthesia be local or general.

## 2014-05-25 NOTE — Telephone Encounter (Signed)
Kim returned my call patient is having an open repair of the Epigastric ventral and umbilical hernia, patient will have general anesthesia, clearances is needed before patient can be scheduled for surgery, a note can be given either through Epic or by faxing to [phone]

## 2014-05-25 NOTE — Telephone Encounter (Signed)
Patient is neurologically cleared for the surgery but needs to continue Dilantin for seizure prevention when he is able to swallow. I do not prescribe his warfarin.

## 2014-05-26 ENCOUNTER — Other Ambulatory Visit (INDEPENDENT_AMBULATORY_CARE_PROVIDER_SITE_OTHER): Payer: Commercial Managed Care - HMO | Admitting: Radiology

## 2014-05-26 ENCOUNTER — Ambulatory Visit (INDEPENDENT_AMBULATORY_CARE_PROVIDER_SITE_OTHER): Payer: Commercial Managed Care - HMO | Admitting: *Deleted

## 2014-05-26 DIAGNOSIS — R569 Unspecified convulsions: Secondary | ICD-10-CM

## 2014-05-26 DIAGNOSIS — I4891 Unspecified atrial fibrillation: Secondary | ICD-10-CM

## 2014-05-26 DIAGNOSIS — Z5181 Encounter for therapeutic drug level monitoring: Secondary | ICD-10-CM

## 2014-05-26 DIAGNOSIS — Z7901 Long term (current) use of anticoagulants: Secondary | ICD-10-CM

## 2014-05-26 LAB — POCT INR: INR: 3.6

## 2014-05-27 ENCOUNTER — Encounter: Payer: Self-pay | Admitting: *Deleted

## 2014-05-28 NOTE — Progress Notes (Signed)
I called and spoke to the patient and gave results of EEG showing mild nonspecific slowing of brain activity but without definite seizures. He was advised to continue Dilantin and keep upcoming appointment for MRI scan and followup with me

## 2014-06-04 ENCOUNTER — Telehealth (INDEPENDENT_AMBULATORY_CARE_PROVIDER_SITE_OTHER): Payer: Self-pay

## 2014-06-04 ENCOUNTER — Telehealth: Payer: Self-pay | Admitting: Cardiology

## 2014-06-04 NOTE — Telephone Encounter (Signed)
Called Dr Claris Gladden office to find out from nurse if they were working on the Pt's Lovenox bridge for the pts upcoming sx.

## 2014-06-04 NOTE — Telephone Encounter (Signed)
Will forward to CV[facility]. 

## 2014-06-04 NOTE — Telephone Encounter (Signed)
New message     Are we working on the paperwork for the patient's lovenox bridge?

## 2014-06-04 NOTE — Telephone Encounter (Signed)
Spoke with Kenney Houseman with Dr. Vonna Kotyk office.  We have not given pt any Lovenox instructions yet because we did not have the date of his procedure.  Was transferred to a voicemail and message left.  Will forward phone note back to [employer] so she may also respond through Epic.

## 2014-06-07 NOTE — Telephone Encounter (Signed)
I spoke with Jeffery Copeland today. She states Dr [name] has cleared pt for surgery through Epic note, Dr Claris Gladden  note states pt may hold coumadin,will need lovenox bridge.

## 2014-06-07 NOTE — Telephone Encounter (Signed)
If no change in symptoms, ok with Lovenox bridge

## 2014-06-07 NOTE — Telephone Encounter (Signed)
Spoke with Dr. Vonna Kotyk office.  They are awaiting cardiac clearance before scheduling.  Will send to Dr. [name] for cardiac clearance.

## 2014-06-08 ENCOUNTER — Telehealth: Payer: Self-pay | Admitting: Critical Care Medicine

## 2014-06-08 MED ORDER — TIOTROPIUM BROMIDE MONOHYDRATE 18 MCG IN CAPS: ORAL_CAPSULE | RESPIRATORY_TRACT | Status: DC

## 2014-06-08 NOTE — Telephone Encounter (Signed)
Pt requests rx for Spiriva be sent to [facility] order.  Rx sent.  F/u appt with Dr Joya Gaskins scheduled.

## 2014-06-09 ENCOUNTER — Telehealth: Payer: Self-pay | Admitting: Neurology

## 2014-06-09 ENCOUNTER — Ambulatory Visit (INDEPENDENT_AMBULATORY_CARE_PROVIDER_SITE_OTHER): Payer: Commercial Managed Care - HMO | Admitting: *Deleted

## 2014-06-09 DIAGNOSIS — Z7901 Long term (current) use of anticoagulants: Secondary | ICD-10-CM

## 2014-06-09 DIAGNOSIS — Z5181 Encounter for therapeutic drug level monitoring: Secondary | ICD-10-CM

## 2014-06-09 DIAGNOSIS — I4891 Unspecified atrial fibrillation: Secondary | ICD-10-CM

## 2014-06-09 LAB — POCT INR: INR: 3.7

## 2014-06-09 MED ORDER — PHENYTOIN SODIUM EXTENDED 100 MG PO CAPS: 300.0000 mg | ORAL_CAPSULE | Freq: Every day | ORAL | Status: DC

## 2014-06-09 NOTE — Telephone Encounter (Signed)
Patient's son calling on behalf of patient to request Dilantin refill, if questions please call.

## 2014-06-09 NOTE — Telephone Encounter (Signed)
There are 3 pharmacies listed in chart.  I called patient back to inquire which one he'd like the Rx sent to.  Says he prefers [facility].  Rx has been sent.  Patient is aware.

## 2014-06-09 NOTE — Telephone Encounter (Signed)
Pt seen in coumadin clinic and informed that he does have cardiac clearance for surgery and he states does not have any transportation so has not scheduled surgery.Pt instructed to speak with the surgeon as he does have clearance from Dr [name] and to let us know when surgery is scheduled as we will have to bring him in sooner for lovenox bridge and he states understanding.Pt given phone number to coumadin clinic as well.

## 2014-06-16 ENCOUNTER — Other Ambulatory Visit: Payer: Commercial Managed Care - HMO

## 2014-06-16 ENCOUNTER — Inpatient Hospital Stay: Admission: RE | Admit: 2014-06-16 | Payer: Commercial Managed Care - HMO | Source: Ambulatory Visit

## 2014-06-23 ENCOUNTER — Ambulatory Visit (INDEPENDENT_AMBULATORY_CARE_PROVIDER_SITE_OTHER): Payer: Commercial Managed Care - HMO | Admitting: Pharmacist

## 2014-06-23 DIAGNOSIS — Z5181 Encounter for therapeutic drug level monitoring: Secondary | ICD-10-CM

## 2014-06-23 DIAGNOSIS — I4891 Unspecified atrial fibrillation: Secondary | ICD-10-CM

## 2014-06-23 DIAGNOSIS — Z7901 Long term (current) use of anticoagulants: Secondary | ICD-10-CM

## 2014-06-23 LAB — POCT INR: INR: 2.8

## 2014-07-14 ENCOUNTER — Ambulatory Visit
Admission: RE | Admit: 2014-07-14 | Discharge: 2014-07-14 | Disposition: A | Discharge status: Home / Self Care | Payer: Commercial Managed Care - HMO | Source: Ambulatory Visit | Attending: Neurology | Admitting: Neurology

## 2014-07-14 ENCOUNTER — Other Ambulatory Visit: Payer: Self-pay | Admitting: Neurology

## 2014-07-14 ENCOUNTER — Ambulatory Visit: Payer: Commercial Managed Care - HMO | Admitting: Critical Care Medicine

## 2014-07-14 ENCOUNTER — Telehealth: Payer: Self-pay | Admitting: Critical Care Medicine

## 2014-07-14 ENCOUNTER — Ambulatory Visit (INDEPENDENT_AMBULATORY_CARE_PROVIDER_SITE_OTHER): Payer: Commercial Managed Care - HMO | Admitting: Pharmacist

## 2014-07-14 DIAGNOSIS — R55 Syncope and collapse: Secondary | ICD-10-CM

## 2014-07-14 DIAGNOSIS — R569 Unspecified convulsions: Secondary | ICD-10-CM

## 2014-07-14 DIAGNOSIS — Z5181 Encounter for therapeutic drug level monitoring: Secondary | ICD-10-CM

## 2014-07-14 DIAGNOSIS — Z7901 Long term (current) use of anticoagulants: Secondary | ICD-10-CM

## 2014-07-14 DIAGNOSIS — I4891 Unspecified atrial fibrillation: Secondary | ICD-10-CM

## 2014-07-14 LAB — POCT INR: INR: 3.1

## 2014-07-14 MED ORDER — GADOBENATE DIMEGLUMINE 529 MG/ML IV SOLN
15.0000 mL | Freq: Once | INTRAVENOUS | Status: AC | PRN
  Administered 2014-07-14: 15 mL via INTRAVENOUS

## 2014-07-14 NOTE — Telephone Encounter (Signed)
Spoke with pt, rescheduled appt on 08/11/14 to 08/18/14 at pt's request.  Advised pt that the form has been put in PW's to do box and we would fax and let him know when the forms are completed and faxed.  Forwarding to Crystal to follow up on.

## 2014-07-16 NOTE — Telephone Encounter (Signed)
Forms have been reviewed by Dr. Joya Gaskins.  Forms is regarding to pt's disability.  This will need to be completed before pt can be considered for [employer] eligibility.  Forms need to be returned before Jul 28, 2014.  Pt requesting forms be returned via fax. Fax # [phone] and is also listed on the letter accompanying forms.  Dr. Joya Gaskins requesting forms to be sent to [facility] for assistance in completing.  I spoke with pt and advised forms sent to [employer] to completed.  He verbalized understanding and voiced no further questions or concerns at this time.    Forms sent to [employer].  I spoke with Hoyle Sauer.  She is aware forms sent down.  Discussed due date with Hoyle Sauer.  She will contact pt to discuss and send out the [employer] package to try to have this completed in a timely manner.

## 2014-08-03 DIAGNOSIS — M542 Cervicalgia: Secondary | ICD-10-CM | POA: Diagnosis not present

## 2014-08-03 DIAGNOSIS — R6889 Other general symptoms and signs: Secondary | ICD-10-CM | POA: Diagnosis not present

## 2014-08-03 DIAGNOSIS — E784 Other hyperlipidemia: Secondary | ICD-10-CM | POA: Diagnosis not present

## 2014-08-03 DIAGNOSIS — Z79899 Other long term (current) drug therapy: Secondary | ICD-10-CM | POA: Diagnosis not present

## 2014-08-03 DIAGNOSIS — M2550 Pain in unspecified joint: Secondary | ICD-10-CM | POA: Diagnosis not present

## 2014-08-05 ENCOUNTER — Ambulatory Visit (INDEPENDENT_AMBULATORY_CARE_PROVIDER_SITE_OTHER): Payer: Commercial Managed Care - HMO | Admitting: Neurology

## 2014-08-05 ENCOUNTER — Encounter: Payer: Self-pay | Admitting: Neurology

## 2014-08-05 VITALS — BP 149/77 | HR 58 | Ht 65.75 in | Wt 171.2 lb

## 2014-08-05 DIAGNOSIS — R6889 Other general symptoms and signs: Secondary | ICD-10-CM | POA: Diagnosis not present

## 2014-08-05 DIAGNOSIS — R569 Unspecified convulsions: Secondary | ICD-10-CM | POA: Diagnosis not present

## 2014-08-05 NOTE — Patient Instructions (Signed)
I had a long discussion the patient with regards to his seizure and personally reviewed his imaging studies and discuss results of EEG and MRI and MRA of the patient and answered questions. Recommend he continue Dilantin 300 mg daily and check level today. He was at wise to avoid seizure provoking stimuli like sleep deprivation and medication noncompliance. He may start driving again after one more month. Return for follow-up in 3 months with Ward Givens, nurse practitioner or call earlier if necessary
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
## 2014-08-06 ENCOUNTER — Telehealth: Payer: Self-pay | Admitting: *Deleted

## 2014-08-06 LAB — PHENYTOIN LEVEL, TOTAL: PHENYTOIN LVL: 13.1 ug/mL (ref 10.0–20.0)

## 2014-08-06 LAB — CBC
HCT: 40.4 % (ref 37.5–51.0)
Hemoglobin: 13.4 g/dL (ref 12.6–17.7)
MCH: 31.2 pg (ref 26.6–33.0)
MCHC: 33.2 g/dL (ref 31.5–35.7)
MCV: 94 fL (ref 79–97)
Platelets: 194 10*3/uL (ref 150–379)
RBC: 4.3 x10E6/uL (ref 4.14–5.80)
RDW: 13.7 % (ref 12.3–15.4)
WBC: 5.9 10*3/uL (ref 3.4–10.8)

## 2014-08-06 NOTE — Telephone Encounter (Signed)
Spoke with patient and informed him of results, no further questions or concerns expressed.

## 2014-08-06 NOTE — Telephone Encounter (Signed)
-----   Message from Antony Contras, MD sent at 08/06/2014  8:20 AM EST ----- Phenytoin level and CBC are fine. No need to change treatment. Inform patient

## 2014-08-11 ENCOUNTER — Ambulatory Visit (INDEPENDENT_AMBULATORY_CARE_PROVIDER_SITE_OTHER): Payer: Commercial Managed Care - HMO | Admitting: *Deleted

## 2014-08-11 ENCOUNTER — Ambulatory Visit: Payer: Commercial Managed Care - HMO | Admitting: Critical Care Medicine

## 2014-08-11 DIAGNOSIS — Z5181 Encounter for therapeutic drug level monitoring: Secondary | ICD-10-CM

## 2014-08-11 DIAGNOSIS — I4891 Unspecified atrial fibrillation: Secondary | ICD-10-CM | POA: Diagnosis not present

## 2014-08-11 DIAGNOSIS — Z7901 Long term (current) use of anticoagulants: Secondary | ICD-10-CM

## 2014-08-11 LAB — POCT INR: INR: 2.6

## 2014-08-18 ENCOUNTER — Encounter: Payer: Self-pay | Admitting: Critical Care Medicine

## 2014-08-18 ENCOUNTER — Ambulatory Visit (INDEPENDENT_AMBULATORY_CARE_PROVIDER_SITE_OTHER): Payer: Commercial Managed Care - HMO | Admitting: Critical Care Medicine

## 2014-08-18 VITALS — BP 116/60 | HR 58 | Temp 98.4°F | Ht 68.0 in | Wt 170.0 lb

## 2014-08-18 DIAGNOSIS — J432 Centrilobular emphysema: Secondary | ICD-10-CM

## 2014-08-18 DIAGNOSIS — R6889 Other general symptoms and signs: Secondary | ICD-10-CM | POA: Diagnosis not present

## 2014-08-18 MED ORDER — TIOTROPIUM BROMIDE MONOHYDRATE 18 MCG IN CAPS: ORAL_CAPSULE | RESPIRATORY_TRACT | Status: DC

## 2014-08-18 NOTE — Assessment & Plan Note (Signed)
Copd Gold C with primary emphysema stable at present Plan Cont spiriva daily rov 1 yr

## 2014-08-18 NOTE — Progress Notes (Signed)
  Subjective:    Patient ID: Jeffery Copeland, male    DOB: 14-Oct-1945, 69 y.o.   MRN: [id number]  HPI 69 y.o. [facility] 08/18/2014 Chief Complaint  Patient presents with  . 6 month follow up    SOB when walking short distances, cough with white mucus.  No wheezing or chest tightness/CP.  Pt notes some dyspnea with short walks.  Notes some white mucus. Not discolored.  No chest pain or tightness. Pt denies any significant sore throat, nasal congestion or excess secretions, fever, chills, sweats, unintended weight loss, pleurtic or exertional chest pain, orthopnea PND, or leg swelling Pt denies any increase in rescue therapy over baseline, denies waking up needing it or having any early am or nocturnal exacerbations of coughing/wheezing/or dyspnea. Pt also denies any obvious fluctuation in symptoms with  weather or environmental change or other alleviating or aggravating factors   Review of Systems Constitutional:   No  weight loss, night sweats,  Fevers, chills, fatigue, lassitude. HEENT:   No headaches,  Difficulty swallowing,  Tooth/dental problems,  Sore throat,                No sneezing, itching, ear ache, nasal congestion, post nasal drip,   CV:  No chest pain,  Orthopnea, PND, swelling in lower extremities, anasarca, dizziness, palpitations  GI  No heartburn, indigestion, abdominal pain, nausea, vomiting, diarrhea, change in bowel habits, loss of appetite  Resp: Notes shortness of breath with exertion not  at rest.  No excess mucus, notes  productive cough,  No non-productive cough,  No coughing up of blood.  No change in color of mucus.  No wheezing.  No chest wall deformity  Skin: no rash or lesions.  GU: no dysuria, change in color of urine, no urgency or frequency.  No flank pain.  MS:  No joint pain or swelling.  No decreased range of motion.  No back pain.  Psych:  No change in mood or affect. No depression or anxiety.  No memory loss.     Objective:   Physical Exam Filed  Vitals:   08/18/14 0916  BP: 116/60  Pulse: 58  Temp: 98.4 F (36.9 C)  TempSrc: Oral  Height: 5\' 8"  (1.727 m)  Weight: 170 lb (77.111 kg)  SpO2: 98%    Gen: Pleasant, well-nourished, in no distress,  normal affect  ENT: No lesions,  mouth clear,  oropharynx clear, no postnasal drip  Neck: No JVD, no TMG, no carotid bruits  Lungs: No use of accessory muscles, no dullness to percussion, distant BS  Cardiovascular: RRR, heart sounds normal, no murmur or gallops, no peripheral edema  Abdomen: soft and NT, no HSM,  BS normal  Musculoskeletal: No deformities, no cyanosis or clubbing  Neuro: alert, non focal  Skin: Warm, no lesions or rashes      Assessment & Plan:   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

## 2014-08-18 NOTE — Patient Instructions (Signed)
No change in medications. Return in          1 year 

## 2014-08-26 ENCOUNTER — Ambulatory Visit: Payer: Commercial Managed Care - HMO | Admitting: Cardiology

## 2014-09-22 ENCOUNTER — Ambulatory Visit (INDEPENDENT_AMBULATORY_CARE_PROVIDER_SITE_OTHER): Payer: Commercial Managed Care - HMO | Admitting: *Deleted

## 2014-09-22 DIAGNOSIS — I4891 Unspecified atrial fibrillation: Secondary | ICD-10-CM | POA: Diagnosis not present

## 2014-09-22 DIAGNOSIS — Z5181 Encounter for therapeutic drug level monitoring: Secondary | ICD-10-CM | POA: Diagnosis not present

## 2014-09-22 DIAGNOSIS — Z7901 Long term (current) use of anticoagulants: Secondary | ICD-10-CM | POA: Diagnosis not present

## 2014-09-22 LAB — POCT INR: INR: 1.9

## 2014-09-27 DIAGNOSIS — Z125 Encounter for screening for malignant neoplasm of prostate: Secondary | ICD-10-CM | POA: Diagnosis not present

## 2014-10-04 DIAGNOSIS — R972 Elevated prostate specific antigen [PSA]: Secondary | ICD-10-CM | POA: Diagnosis not present

## 2014-10-06 ENCOUNTER — Other Ambulatory Visit: Payer: Self-pay | Admitting: Cardiology

## 2014-10-13 ENCOUNTER — Ambulatory Visit (INDEPENDENT_AMBULATORY_CARE_PROVIDER_SITE_OTHER): Payer: Commercial Managed Care - HMO | Admitting: *Deleted

## 2014-10-13 DIAGNOSIS — Z5181 Encounter for therapeutic drug level monitoring: Secondary | ICD-10-CM | POA: Diagnosis not present

## 2014-10-13 DIAGNOSIS — I4891 Unspecified atrial fibrillation: Secondary | ICD-10-CM

## 2014-10-13 DIAGNOSIS — M545 Low back pain: Secondary | ICD-10-CM | POA: Diagnosis not present

## 2014-10-13 DIAGNOSIS — Z7901 Long term (current) use of anticoagulants: Secondary | ICD-10-CM

## 2014-10-13 DIAGNOSIS — M542 Cervicalgia: Secondary | ICD-10-CM | POA: Diagnosis not present

## 2014-10-13 DIAGNOSIS — Z79899 Other long term (current) drug therapy: Secondary | ICD-10-CM | POA: Diagnosis not present

## 2014-10-13 DIAGNOSIS — E785 Hyperlipidemia, unspecified: Secondary | ICD-10-CM | POA: Diagnosis not present

## 2014-10-13 DIAGNOSIS — I10 Essential (primary) hypertension: Secondary | ICD-10-CM | POA: Diagnosis not present

## 2014-10-13 DIAGNOSIS — R52 Pain, unspecified: Secondary | ICD-10-CM | POA: Diagnosis not present

## 2014-10-13 LAB — POCT INR: INR: 2.1

## 2014-10-28 ENCOUNTER — Encounter: Payer: Self-pay | Admitting: Cardiology

## 2014-10-28 ENCOUNTER — Ambulatory Visit (INDEPENDENT_AMBULATORY_CARE_PROVIDER_SITE_OTHER): Payer: Commercial Managed Care - HMO | Admitting: Cardiology

## 2014-10-28 ENCOUNTER — Ambulatory Visit (INDEPENDENT_AMBULATORY_CARE_PROVIDER_SITE_OTHER): Payer: Commercial Managed Care - HMO | Admitting: Surgery

## 2014-10-28 ENCOUNTER — Encounter: Payer: Self-pay | Admitting: *Deleted

## 2014-10-28 VITALS — BP 140/72 | HR 67 | Ht 68.0 in | Wt 168.0 lb

## 2014-10-28 DIAGNOSIS — I484 Atypical atrial flutter: Secondary | ICD-10-CM

## 2014-10-28 DIAGNOSIS — Z7901 Long term (current) use of anticoagulants: Secondary | ICD-10-CM

## 2014-10-28 DIAGNOSIS — I059 Rheumatic mitral valve disease, unspecified: Secondary | ICD-10-CM | POA: Diagnosis not present

## 2014-10-28 DIAGNOSIS — I10 Essential (primary) hypertension: Secondary | ICD-10-CM

## 2014-10-28 DIAGNOSIS — I5032 Chronic diastolic (congestive) heart failure: Secondary | ICD-10-CM | POA: Diagnosis not present

## 2014-10-28 DIAGNOSIS — Z954 Presence of other heart-valve replacement: Secondary | ICD-10-CM | POA: Diagnosis not present

## 2014-10-28 DIAGNOSIS — Z5181 Encounter for therapeutic drug level monitoring: Secondary | ICD-10-CM

## 2014-10-28 DIAGNOSIS — I4891 Unspecified atrial fibrillation: Secondary | ICD-10-CM | POA: Diagnosis not present

## 2014-10-28 DIAGNOSIS — R6889 Other general symptoms and signs: Secondary | ICD-10-CM | POA: Diagnosis not present

## 2014-10-28 DIAGNOSIS — Z952 Presence of prosthetic heart valve: Secondary | ICD-10-CM

## 2014-10-28 LAB — POCT INR: INR: 4.1

## 2014-10-28 NOTE — Progress Notes (Signed)
Patient ID: Jeffery Copeland, male   DOB: 1946/04/21, 69 y.o.   MRN: [id number] PCP: Dr. Karlton Lemon  69 yo with history of endocarditis necessitating bioprosthetic mitral valve replacement in 1999 with redo bioprosthetic mitral valve in 8/14 for degeneration of the valve with severe MR as well as chronic atrial fibrillation presents for cardiology followup.  He seems to have done well since his redo MVR.  Echo in 10/14 showed EF 55-60% with normal biorprosthetic mitral valve.  He is breathing better than before surgery.  He is short of breath after walking about 200-250 feet, this is stable and seems to be primarily due to COPD.  No chest pain, lightheadedness, or syncope.  No orthopnea/PND.  Weight is stable. He goes fishing regularly but generally is not getting much exercise.  No melena or BRBPR. He had one seizure in 8/15 with unremarkable workup.  He is now on Dilantin and has had no recurrence.   ECG: Atypical atrial flutter 4:1  Labs (4/14): K 4.1, creatinine 1.3 Labs (10/14): K 4.5, creatinine 1.3 Labs (7/15): LDL 64, HDL 44 Labs (8/15): K 4.5, creatinine 1.19 Labs (1/16): HCT 40.4  PMH: 1. Fe deficiency anemia 2. 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  Echo (2/14) with EF 55-60%, bioprosthetic mitral valve with mean gradient 8 mmHg, no definite MR noted though there was significant shadowing, severe LAE.  TEE after 2/14 echo showed severe mitral regurgitation involving the bioprosthetic MV.  Patient had right mini-thoracotomy with redo MVR (bioprosthetic) in 8/14.  Echo (10/14) showed EF 55-60% with normal bioprosthetic mitral valve and PA systolic pressure 32 mmHg.  3. COPD: Followed by Dr. Joya Gaskins. 4. GERD 5. Lung nodules: LLL nodule removed in 2008 by left lower lobectomy.  RLL nodule, currently observing.  6. Atrial fibrillation: Chronic, on coumadin. 7. Chronic diastolic CHF 8. LHC (7/14) with no significant CAD.  9. Seizure disorder: 8/15 had a  single seizure, workup unremarkable, now on Dilantin  SH: Divorced, lives alone in [place].  Quit smoking in 2006.    FH: No premature CAD.  ROS: All systems reviewed and negative except as per HPI.   Current Outpatient Prescriptions  Medication Sig Dispense Refill  . Cholecalciferol (VITAMIN D3) 1000 UNITS CAPS Take 1 capsule by mouth daily.     Marland Kitchen ezetimibe (ZETIA) 10 MG tablet Take 1 tablet (10 mg total) by mouth daily. 30 tablet 6  . methocarbamol (ROBAXIN) 500 MG tablet Take 500 mg by mouth 2 (two) times daily.    . metoprolol tartrate (LOPRESSOR) 25 MG tablet TAKE 1/2 TABLET BY MOUTH TWICE A DAY 30 tablet 4  . oxyCODONE (OXY IR/ROXICODONE) 5 MG immediate release tablet Take 1 tablet by mouth every 6 (six) hours as needed.    . phenytoin (DILANTIN) 100 MG ER capsule Take 3 capsules (300 mg total) by mouth daily. 90 capsule 1  . ramipril (ALTACE) 5 MG capsule TAKE ONE CAPSULE BY MOUTH EVERY DAY 30 capsule 4  . tiotropium (SPIRIVA HANDIHALER) 18 MCG inhalation capsule PLACE 1 CAPSULE INTO INHALER & INHALE DAILY 90 capsule 4  . warfarin (COUMADIN) 5 MG tablet TAKE AS DIRECTED BY ANTICOAGULATION CLINIC 100 tablet 0  . zolpidem (AMBIEN) 5 MG tablet Take 5 mg by mouth at bedtime as needed. For insomnia.     No current facility-administered medications for this visit.    BP 140/72 mmHg  Pulse 67  Ht 5\' 8"  (1.727 m)  Wt 168 lb (76.204 kg)  BMI 25.55 kg/m2 General: NAD Neck: No JVD, no thyromegaly or thyroid nodule.  Lungs: Clear to auscultation bilaterally with normal respiratory effort. CV: Nondisplaced PMI.  Heart regular S1/S2, 1/6 SEM RUSB.  No peripheral edema.  No carotid bruit.  Normal pedal pulses.  Abdomen: Soft, nontender, no hepatosplenomegaly, no distention.  Skin: Intact without lesions or rashes.  Neurologic: Alert and oriented x 3.  Psych: Normal affect. Extremities: No clubbing or cyanosis.   Assessment/Plan:  1. Bioprosthetic mitral valve:  Redo MVR in 8/14.   Valve looked good on 10/14 echo.  He seems to be stable.  I will repeat an echocardiogram to reassess the valve this year.  2. Atrial fibrillation: Chronic, good rate control on current dose of metoprolol.  Continue anticoagulation with coumadin given valvular atrial fibrillation. CBC ok in 1/16.  3. Chronic diastolic CHF: He is not volume overloaded on exam. He is now off Lasix.   4. COPD: Stable symptoms, he is on Spiriva.   Loralie Champagne 10/28/2014

## 2014-10-28 NOTE — Patient Instructions (Signed)
Your physician has requested that you have an echocardiogram. Echocardiography is a painless test that uses sound waves to create images of your heart. It provides your doctor with information about the size and shape of your heart and how well your heart's chambers and valves are working. This procedure takes approximately one hour. There are no restrictions for this procedure.  Your physician wants you to follow-up in: 6 months with Dr [name]. (September 2016).  You will receive a reminder letter in the mail two months in advance. If you don't receive a letter, please call our office to schedule the follow-up appointment.

## 2014-11-04 ENCOUNTER — Ambulatory Visit (INDEPENDENT_AMBULATORY_CARE_PROVIDER_SITE_OTHER): Payer: Commercial Managed Care - HMO | Admitting: Adult Health

## 2014-11-04 ENCOUNTER — Encounter: Payer: Self-pay | Admitting: Adult Health

## 2014-11-04 VITALS — BP 131/81 | HR 64 | Ht 65.0 in | Wt 168.0 lb

## 2014-11-04 DIAGNOSIS — R569 Unspecified convulsions: Secondary | ICD-10-CM

## 2014-11-04 DIAGNOSIS — R6889 Other general symptoms and signs: Secondary | ICD-10-CM | POA: Diagnosis not present

## 2014-11-04 MED ORDER — PHENYTOIN SODIUM EXTENDED 100 MG PO CAPS: 300.0000 mg | ORAL_CAPSULE | Freq: Every day | ORAL | Status: DC

## 2014-11-04 NOTE — Patient Instructions (Signed)
Continue Dilantin. Will refill today.  If you have any seizure events let us know.
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
## 2014-11-08 ENCOUNTER — Ambulatory Visit (HOSPITAL_COMMUNITY): Payer: Commercial Managed Care - HMO | Attending: Cardiology | Admitting: Cardiology

## 2014-11-08 ENCOUNTER — Ambulatory Visit (INDEPENDENT_AMBULATORY_CARE_PROVIDER_SITE_OTHER): Payer: Commercial Managed Care - HMO | Admitting: *Deleted

## 2014-11-08 DIAGNOSIS — I4891 Unspecified atrial fibrillation: Secondary | ICD-10-CM

## 2014-11-08 DIAGNOSIS — J449 Chronic obstructive pulmonary disease, unspecified: Secondary | ICD-10-CM | POA: Insufficient documentation

## 2014-11-08 DIAGNOSIS — I509 Heart failure, unspecified: Secondary | ICD-10-CM | POA: Diagnosis not present

## 2014-11-08 DIAGNOSIS — Z7901 Long term (current) use of anticoagulants: Secondary | ICD-10-CM | POA: Diagnosis not present

## 2014-11-08 DIAGNOSIS — Z952 Presence of prosthetic heart valve: Secondary | ICD-10-CM | POA: Diagnosis not present

## 2014-11-08 DIAGNOSIS — I059 Rheumatic mitral valve disease, unspecified: Secondary | ICD-10-CM

## 2014-11-08 DIAGNOSIS — Z5181 Encounter for therapeutic drug level monitoring: Secondary | ICD-10-CM | POA: Diagnosis not present

## 2014-11-08 LAB — POCT INR: INR: 3.3

## 2014-11-08 NOTE — Progress Notes (Signed)
Echo performed. 

## 2014-11-29 ENCOUNTER — Ambulatory Visit (INDEPENDENT_AMBULATORY_CARE_PROVIDER_SITE_OTHER): Payer: Commercial Managed Care - HMO | Admitting: *Deleted

## 2014-11-29 DIAGNOSIS — I4891 Unspecified atrial fibrillation: Secondary | ICD-10-CM | POA: Diagnosis not present

## 2014-11-29 DIAGNOSIS — Z7901 Long term (current) use of anticoagulants: Secondary | ICD-10-CM | POA: Diagnosis not present

## 2014-11-29 DIAGNOSIS — Z5181 Encounter for therapeutic drug level monitoring: Secondary | ICD-10-CM

## 2014-11-29 LAB — POCT INR: INR: 2.5

## 2014-12-08 ENCOUNTER — Other Ambulatory Visit: Payer: Self-pay | Admitting: Cardiology

## 2014-12-15 DIAGNOSIS — I10 Essential (primary) hypertension: Secondary | ICD-10-CM | POA: Diagnosis not present

## 2014-12-15 DIAGNOSIS — M2550 Pain in unspecified joint: Secondary | ICD-10-CM | POA: Diagnosis not present

## 2014-12-15 DIAGNOSIS — Z79899 Other long term (current) drug therapy: Secondary | ICD-10-CM | POA: Diagnosis not present

## 2014-12-30 ENCOUNTER — Ambulatory Visit (INDEPENDENT_AMBULATORY_CARE_PROVIDER_SITE_OTHER): Payer: Commercial Managed Care - HMO | Admitting: *Deleted

## 2014-12-30 DIAGNOSIS — R6889 Other general symptoms and signs: Secondary | ICD-10-CM | POA: Diagnosis not present

## 2014-12-30 DIAGNOSIS — Z7901 Long term (current) use of anticoagulants: Secondary | ICD-10-CM

## 2014-12-30 DIAGNOSIS — Z5181 Encounter for therapeutic drug level monitoring: Secondary | ICD-10-CM | POA: Diagnosis not present

## 2014-12-30 DIAGNOSIS — I4891 Unspecified atrial fibrillation: Secondary | ICD-10-CM

## 2014-12-30 LAB — POCT INR: INR: 2.7

## 2015-01-04 DIAGNOSIS — R972 Elevated prostate specific antigen [PSA]: Secondary | ICD-10-CM | POA: Diagnosis not present

## 2015-01-04 DIAGNOSIS — R6889 Other general symptoms and signs: Secondary | ICD-10-CM | POA: Diagnosis not present

## 2015-01-10 DIAGNOSIS — R972 Elevated prostate specific antigen [PSA]: Secondary | ICD-10-CM | POA: Diagnosis not present

## 2015-01-10 DIAGNOSIS — R6889 Other general symptoms and signs: Secondary | ICD-10-CM | POA: Diagnosis not present

## 2015-01-13 ENCOUNTER — Telehealth: Payer: Self-pay | Admitting: Cardiology

## 2015-01-13 NOTE — Telephone Encounter (Signed)
Rec'd walk-in form with attached SCAT form will give to [facility] 6/20 upon her return.  01/13/15 ltd

## 2015-01-17 NOTE — Telephone Encounter (Signed)
Form completed, returned to medical records to be mailed to pt at his request.

## 2015-01-18 ENCOUNTER — Telehealth: Payer: Self-pay | Admitting: Cardiology

## 2015-01-18 NOTE — Telephone Encounter (Signed)
6/21 Received completed [employer] forms from [employer] will call patient for to pick up.  ltd

## 2015-01-26 ENCOUNTER — Telehealth: Payer: Self-pay | Admitting: Cardiology

## 2015-01-26 NOTE — Telephone Encounter (Signed)
[employer] SCAT Questionnaire Form mailed to pt home address.

## 2015-02-03 ENCOUNTER — Ambulatory Visit (INDEPENDENT_AMBULATORY_CARE_PROVIDER_SITE_OTHER): Payer: Commercial Managed Care - HMO | Admitting: Surgery

## 2015-02-03 DIAGNOSIS — Z7901 Long term (current) use of anticoagulants: Secondary | ICD-10-CM

## 2015-02-03 DIAGNOSIS — I4891 Unspecified atrial fibrillation: Secondary | ICD-10-CM | POA: Diagnosis not present

## 2015-02-03 DIAGNOSIS — Z5181 Encounter for therapeutic drug level monitoring: Secondary | ICD-10-CM | POA: Diagnosis not present

## 2015-02-03 DIAGNOSIS — R6889 Other general symptoms and signs: Secondary | ICD-10-CM | POA: Diagnosis not present

## 2015-02-03 LAB — POCT INR: INR: 3.6

## 2015-02-03 NOTE — Patient Instructions (Addendum)
02/09/15-Take your last dose of Coumadin before the biopsy on 7/19 02/21/15-Restart same dose of Coumadin after the biopsy, 1 tablet every day

## 2015-02-15 DIAGNOSIS — C61 Malignant neoplasm of prostate: Secondary | ICD-10-CM | POA: Diagnosis not present

## 2015-02-15 DIAGNOSIS — R972 Elevated prostate specific antigen [PSA]: Secondary | ICD-10-CM | POA: Diagnosis not present

## 2015-02-15 DIAGNOSIS — D075 Carcinoma in situ of prostate: Secondary | ICD-10-CM | POA: Diagnosis not present

## 2015-02-15 DIAGNOSIS — R6889 Other general symptoms and signs: Secondary | ICD-10-CM | POA: Diagnosis not present

## 2015-02-28 ENCOUNTER — Ambulatory Visit (INDEPENDENT_AMBULATORY_CARE_PROVIDER_SITE_OTHER): Payer: Commercial Managed Care - HMO | Admitting: Pharmacist

## 2015-02-28 DIAGNOSIS — Z7901 Long term (current) use of anticoagulants: Secondary | ICD-10-CM | POA: Diagnosis not present

## 2015-02-28 DIAGNOSIS — I4891 Unspecified atrial fibrillation: Secondary | ICD-10-CM | POA: Diagnosis not present

## 2015-02-28 DIAGNOSIS — Z5181 Encounter for therapeutic drug level monitoring: Secondary | ICD-10-CM

## 2015-02-28 LAB — POCT INR: INR: 1.8

## 2015-03-03 DIAGNOSIS — C61 Malignant neoplasm of prostate: Secondary | ICD-10-CM | POA: Diagnosis not present

## 2015-03-07 ENCOUNTER — Ambulatory Visit (INDEPENDENT_AMBULATORY_CARE_PROVIDER_SITE_OTHER): Payer: Commercial Managed Care - HMO | Admitting: *Deleted

## 2015-03-07 DIAGNOSIS — Z7901 Long term (current) use of anticoagulants: Secondary | ICD-10-CM | POA: Diagnosis not present

## 2015-03-07 DIAGNOSIS — I4891 Unspecified atrial fibrillation: Secondary | ICD-10-CM

## 2015-03-07 DIAGNOSIS — Z5181 Encounter for therapeutic drug level monitoring: Secondary | ICD-10-CM

## 2015-03-07 LAB — POCT INR: INR: 2.2

## 2015-03-11 ENCOUNTER — Encounter: Payer: Self-pay | Admitting: Radiation Oncology

## 2015-03-11 NOTE — Progress Notes (Signed)
GU Location of Tumor / Histology: prostatic adenocarcinoma  If Prostate Cancer, Gleason Score is (3 + 4) and PSA is (3.62)  Jeffery Copeland was referred by his PCP, Dr. Karlton Lemon, for evaluation of prostate ca due to strong family history.  Biopsies of prostate (if applicable) revealed:    Past/Anticipated interventions by urology, if any: prostate biopsy, referral to radiation oncology, recommends external beam with androgen deprivation  Past/Anticipated interventions by medical oncology, if any: no  Weight changes, if any: no  Bowel/Bladder complaints, if any: nocturia x 2 and ED; denies difficulty emptying denies weak stream, denies hesitancy, denies frequency or urgency, denies incontinence or leakage.    Nausea/Vomiting, if any: no  Pain issues, if any:  no  SAFETY ISSUES:  Prior radiation? no  Pacemaker/ICD?   Possible current pregnancy? no  Is the patient on methotrexate? no  Current Complaints / other details:  69 year old male. NKDA. Retired. External beam recommended with androgen deprivation.

## 2015-03-14 ENCOUNTER — Ambulatory Visit
Admission: RE | Admit: 2015-03-14 | Discharge: 2015-03-14 | Disposition: A | Discharge status: Home / Self Care | Payer: Commercial Managed Care - HMO | Source: Ambulatory Visit | Attending: Radiation Oncology | Admitting: Radiation Oncology

## 2015-03-14 ENCOUNTER — Encounter: Payer: Self-pay | Admitting: Radiation Oncology

## 2015-03-14 ENCOUNTER — Telehealth: Payer: Self-pay | Admitting: *Deleted

## 2015-03-14 VITALS — BP 154/87 | HR 73 | Resp 16 | Ht 69.0 in | Wt 166.1 lb

## 2015-03-14 DIAGNOSIS — C61 Malignant neoplasm of prostate: Secondary | ICD-10-CM | POA: Insufficient documentation

## 2015-03-14 DIAGNOSIS — Z952 Presence of prosthetic heart valve: Secondary | ICD-10-CM | POA: Insufficient documentation

## 2015-03-14 DIAGNOSIS — I10 Essential (primary) hypertension: Secondary | ICD-10-CM | POA: Diagnosis not present

## 2015-03-14 DIAGNOSIS — I679 Cerebrovascular disease, unspecified: Secondary | ICD-10-CM | POA: Diagnosis not present

## 2015-03-14 DIAGNOSIS — Z510 Encounter for antineoplastic radiation therapy: Secondary | ICD-10-CM | POA: Insufficient documentation

## 2015-03-14 DIAGNOSIS — D509 Iron deficiency anemia, unspecified: Secondary | ICD-10-CM | POA: Diagnosis not present

## 2015-03-14 DIAGNOSIS — R918 Other nonspecific abnormal finding of lung field: Secondary | ICD-10-CM | POA: Insufficient documentation

## 2015-03-14 DIAGNOSIS — J449 Chronic obstructive pulmonary disease, unspecified: Secondary | ICD-10-CM | POA: Insufficient documentation

## 2015-03-14 DIAGNOSIS — I4891 Unspecified atrial fibrillation: Secondary | ICD-10-CM | POA: Insufficient documentation

## 2015-03-14 NOTE — Progress Notes (Signed)
Patient unable to read thus, this RN assisted patient in completing AUA and SHIM form. IPSS of 2 discovered. Denies dysuria or hematuria. Denies night sweats or weight loss. Reports nocturia x 2. Denies incontinence or leakage. Reports he had a seizure one year ago but, cause was not found. Reports he has been on Dilantin since without any seizure activity. Reports his eldest brother and father died of prostate ca. Denies any history of breast ca in his family. Using [employer] for transportation. Recently, moved from his home into an apartment where he lives alone.

## 2015-03-14 NOTE — Progress Notes (Signed)
See progress note under physician encounter. 

## 2015-03-14 NOTE — Progress Notes (Signed)
Radiation Oncology         [phone] ________________________________  Initial outpatient Consultation  Name: MELECIO CUETO MRN: [id number]  Date: 03/14/2015  DOB: January 21, 1946  ZJ:IRCVELF,YBOFB N, MD  Ardis Hughs, MD   REFERRING PHYSICIAN: Ardis Hughs, MD  DIAGNOSIS: 69 y.o. gentleman with stage T1c adenocarcinoma of the prostate with a Gleason's score of 3+4 and a PSA of 3.62    ICD-9-CM ICD-10-CM   1. Prostate ca 185 C61   2. Malignant neoplasm of prostate Stickney LADERRICK WILK is a 69 y.o. gentleman.  He was noted to have an elevated PSA of 3.62 by his primary care physician, Dr. Karlton Lemon.  Accordingly, he was referred for evaluation in urology by Dr. Louis Meckel on 01/10/15,  digital rectal examination was performed at that time revealing a 1+ prostate without nodules.  The patient proceeded to transrectal ultrasound with 12 biopsies of the prostate on 02/15/15.  The prostate volume measured 16.81 cc.  Out of 12 core biopsies, 6 were positive.  The maximum Gleason score was 3+4, and this was seen in the left lateral base.  The patient reviewed the biopsy results with his urologist and he has kindly been referred today for discussion of potential radiation treatment options.  PREVIOUS RADIATION THERAPY: No  PAST MEDICAL HISTORY:  has a past medical history of Acute and subacute bacterial endocarditis (1999); Cerebrovascular disease, unspecified; Esophageal reflux; Generalized osteoarthrosis, unspecified site; Hypertension; COPD (chronic obstructive pulmonary disease) (1999); Lung nodules (2008, 2014); Atrial fibrillation (11/11/2008); CARDIOMYOPATHY (10/20/2008); ISCHEMIC COLITIS, HX OF (10/20/2008); S/P mitral valve replacement (02/01/1998); HYPERTENSION, BENIGN (10/20/2008); Epidural hematoma (03/01/2011); Severe mitral regurgitation (02/20/2013); Iron deficiency anemia, unspecified; S/P mitral valve replacement with bioprosthetic valve (03/24/2013);  Seizures; and Prostate cancer.    PAST SURGICAL HISTORY: Past Surgical History  Procedure Laterality Date  . Mitral valve replacement  02/01/1998    Carpentier-Edwards porcine bioprosthetic tissue valve, size 72mm, placed for complicated bacterial endocarditis  . Video assisted thoracoscopy  04/29/2007    Left VATS w/ mini thoracotomy for Left Lower Lobectomy for benign lung nodules  . Tee without cardioversion N/A 02/12/2013    Procedure: TRANSESOPHAGEAL ECHOCARDIOGRAM (TEE);  Surgeon: Fay Records, MD;  Location[facility];  Service: Cardiovascular;  Laterality: N/A;  . Mitral valve replacement Right 03/24/2013    Procedure: MINIMALLY INVASIVE REDO MITRAL VALVE (MV) REPLACEMENT;  Surgeon: Rexene Alberts, MD;  Location: [place];  Service: Open Heart Surgery;  Laterality: Right;  . Minimally invasive maze procedure N/A 03/24/2013    Procedure: MINIMALLY INVASIVE MAZE PROCEDURE;  Surgeon: Rexene Alberts, MD;  Location: [place];  Service: Open Heart Surgery;  Laterality: N/A;  . Intraoperative transesophageal echocardiogram N/A 03/24/2013    Procedure: INTRAOPERATIVE TRANSESOPHAGEAL ECHOCARDIOGRAM;  Surgeon: Rexene Alberts, MD;  Location: [place];  Service: Open Heart Surgery;  Laterality: N/A;  . Prostate biopsy      FAMILY HISTORY: family history includes Cancer in his brother and father; Heart attack in his father; Tuberculosis in his father.  SOCIAL HISTORY:  reports that he has been smoking Cigarettes.  He has a 22 pack-year smoking history. He has never used smokeless tobacco. He reports that he does not drink alcohol or use illicit drugs.  ALLERGIES: Rosuvastatin  MEDICATIONS:  Current Outpatient Prescriptions  Medication Sig Dispense Refill  . Cholecalciferol (VITAMIN D3) 1000 UNITS CAPS Take 1 capsule by mouth daily.     Marland Kitchen ezetimibe (ZETIA) 10  MG tablet Take 1 tablet (10 mg total) by mouth daily. 30 tablet 6  . methocarbamol (ROBAXIN) 500 MG tablet Take 500 mg by mouth 2 (two) times  daily.    . metoprolol tartrate (LOPRESSOR) 25 MG tablet TAKE 1/2 TABLET BY MOUTH TWICE A DAY 30 tablet 4  . oxyCODONE (OXY IR/ROXICODONE) 5 MG immediate release tablet Take 1 tablet by mouth every 6 (six) hours as needed.    . phenytoin (DILANTIN) 100 MG ER capsule Take 3 capsules (300 mg total) by mouth daily. 90 capsule 6  . ramipril (ALTACE) 5 MG capsule TAKE ONE CAPSULE BY MOUTH EVERY DAY 30 capsule 4  . tiotropium (SPIRIVA HANDIHALER) 18 MCG inhalation capsule PLACE 1 CAPSULE INTO INHALER & INHALE DAILY 90 capsule 4  . warfarin (COUMADIN) 5 MG tablet TAKE AS DIRECTED BY ANTICOAGULATION CLINIC 100 tablet 0  . zolpidem (AMBIEN) 5 MG tablet Take 5 mg by mouth at bedtime as needed. For insomnia.     No current facility-administered medications for this encounter.    REVIEW OF SYSTEMS:  A 15 point review of systems is documented in the electronic medical record. This was obtained by the nursing staff. However, I reviewed this with the patient to discuss relevant findings and make appropriate changes.  A comprehensive review of systems was negative..  The patient completed an IPSS and IIEF questionnaire.    Patient unable to read, thus this RN assisted patient in completing AUA and SHIM form. IPSS of 2 discovered. Denies dysuria or hematuria. Denies night sweats or weight loss. Reports nocturia x 2. Denies incontinence or leakage. Reports he had a seizure one year ago but, cause was not found. Reports he has been on Dilantin since without any seizure activity. Reports his eldest brother and father died of prostate ca. Denies any history of breast ca in his family. Using [employer] for transportation. Recently, moved from his home into an apartment where he lives alone. Patient has some hearing loss.    PHYSICAL EXAM: This patient is in no acute distress.  He is alert and oriented.   height is 5\' 9"  (1.753 m) and weight is 166 lb 1.6 oz (75.342 kg). His blood pressure is 154/87 and his pulse is 73. His  respiration is 16 and oxygen saturation is 100%.  He exhibits no respiratory distress or labored breathing.  He appears neurologically intact.  His mood is pleasant.  His affect is appropriate.  Please note the digital rectal exam findings described above.   KPS = 100  100 - Normal; no complaints; no evidence of disease. 90   - Able to carry on normal activity; minor signs or symptoms of disease. 80   - Normal activity with effort; some signs or symptoms of disease. 76   - Cares for self; unable to carry on normal activity or to do active work. 60   - Requires occasional assistance, but is able to care for most of his personal needs. 50   - Requires considerable assistance and frequent medical care. 76   - Disabled; requires special care and assistance. 21   - Severely disabled; hospital admission is indicated although death not imminent. 62   - Very sick; hospital admission necessary; active supportive treatment necessary. 10   - Moribund; fatal processes progressing rapidly. 0     - Dead  Karnofsky DA, Abelmann WH, Craver LS and [facility] [phone]) The use of the nitrogen mustards in the palliative treatment of carcinoma: with particular reference  to bronchogenic carcinoma Cancer 1 634-56   LABORATORY DATA:  Lab Results  Component Value Date   WBC 5.9 08/05/2014   HGB 13.4 08/05/2014   HCT 40.4 08/05/2014   MCV 94 08/05/2014   PLT 194 08/05/2014   Lab Results  Component Value Date   NA 126* 02/28/2014   K 4.5 02/28/2014   CL 92* 02/28/2014   CO2 21 02/28/2014   Lab Results  Component Value Date   ALT 11 02/28/2014   AST 17 02/28/2014   ALKPHOS 98 02/28/2014   BILITOT 0.8 02/28/2014     RADIOGRAPHY: No results found.    IMPRESSION: This gentleman is a 69 yo.  His T-Stage, Gleason's Score, and PSA put him into the intermediate risk group.  Accordingly he is eligible for a variety of potential treatment options including radiation therapy.  PLAN: Today I reviewed the  findings and workup thus far.  We discussed the natural history of prostate cancer.  We reviewed the the implications of T-stage, Gleason's Score, and PSA on decision-making and outcomes in prostate cancer.  We discussed radiation treatment in the management of prostate cancer with regard to the logistics and delivery of external beam radiation treatment as well as the logistics and delivery of prostate brachytherapy.  We compared and contrasted each of these approaches and also compared these against prostatectomy.  The patient expressed interest in external beam radiotherapy.  I filled out a patient counseling form for him with relevant treatment diagrams and we retained a copy for our records.   The patient would like to proceed with prostate IMRT.  I will share my findings with Dr. Louis Meckel and move forward with scheduling placement of three gold fiducial markers into the prostate to proceed with IMRT in the near future.     I enjoyed meeting with him today, and will look forward to participating in the care of this very nice gentleman.   I spent 55 minutes face to face with the patient and more than 50% of that time was spent in counseling and/or coordination of care.   This document serves as a record of services personally performed by [name], MD. It was created on his behalf by Arlyce Harman, a trained medical scribe. The creation of this record is based on the scribe's personal observations and the provider's statements to them. This document has been checked and approved by the attending provider.    ------------------------------------------------  Sheral Apley Tammi Klippel, M.D.

## 2015-03-14 NOTE — Telephone Encounter (Signed)
Called patient to inform of gold seed placement on 04-19-15- arrival time - 2:30 pm @ Dr. Carlton Adam Office and his sim on 04-22-15 @ 1 pm @ Dr. Johny Shears Office, lvm for a return call

## 2015-03-16 ENCOUNTER — Ambulatory Visit (INDEPENDENT_AMBULATORY_CARE_PROVIDER_SITE_OTHER): Payer: Commercial Managed Care - HMO | Admitting: *Deleted

## 2015-03-16 DIAGNOSIS — I4891 Unspecified atrial fibrillation: Secondary | ICD-10-CM

## 2015-03-16 DIAGNOSIS — Z7901 Long term (current) use of anticoagulants: Secondary | ICD-10-CM

## 2015-03-16 DIAGNOSIS — Z5181 Encounter for therapeutic drug level monitoring: Secondary | ICD-10-CM

## 2015-03-16 LAB — POCT INR: INR: 3.1

## 2015-03-22 ENCOUNTER — Ambulatory Visit: Payer: Commercial Managed Care - HMO | Admitting: Radiation Oncology

## 2015-03-28 DIAGNOSIS — M2550 Pain in unspecified joint: Secondary | ICD-10-CM | POA: Diagnosis not present

## 2015-03-28 DIAGNOSIS — Z79899 Other long term (current) drug therapy: Secondary | ICD-10-CM | POA: Diagnosis not present

## 2015-03-28 DIAGNOSIS — I10 Essential (primary) hypertension: Secondary | ICD-10-CM | POA: Diagnosis not present

## 2015-03-28 DIAGNOSIS — E785 Hyperlipidemia, unspecified: Secondary | ICD-10-CM | POA: Diagnosis not present

## 2015-03-28 DIAGNOSIS — E784 Other hyperlipidemia: Secondary | ICD-10-CM | POA: Diagnosis not present

## 2015-03-28 DIAGNOSIS — M545 Low back pain: Secondary | ICD-10-CM | POA: Diagnosis not present

## 2015-03-30 ENCOUNTER — Ambulatory Visit (INDEPENDENT_AMBULATORY_CARE_PROVIDER_SITE_OTHER): Payer: Medicare HMO | Admitting: *Deleted

## 2015-03-30 DIAGNOSIS — Z7901 Long term (current) use of anticoagulants: Secondary | ICD-10-CM | POA: Diagnosis not present

## 2015-03-30 DIAGNOSIS — Z5181 Encounter for therapeutic drug level monitoring: Secondary | ICD-10-CM | POA: Diagnosis not present

## 2015-03-30 DIAGNOSIS — I4891 Unspecified atrial fibrillation: Secondary | ICD-10-CM

## 2015-03-30 LAB — BASIC METABOLIC PANEL
BUN: 12 mg/dL (ref 6–23)
CALCIUM: 8.7 mg/dL (ref 8.4–10.5)
CHLORIDE: 101 meq/L (ref 96–112)
CO2: 29 mEq/L (ref 19–32)
CREATININE: 1.09 mg/dL (ref 0.40–1.50)
GFR: 71.26 mL/min (ref >60.00)
Glucose, Bld: 90 mg/dL (ref 70–99)
Potassium: 4.2 mEq/L (ref 3.5–5.1)
Sodium: 136 mEq/L (ref 135–145)

## 2015-03-30 LAB — POCT INR: INR: 5

## 2015-04-01 ENCOUNTER — Telehealth: Payer: Self-pay | Admitting: Cardiology

## 2015-04-01 NOTE — Telephone Encounter (Signed)
Pt is aware of lab results.

## 2015-04-01 NOTE — Telephone Encounter (Signed)
New Message  Pt returned call. He believes that it is for lab results. Please call

## 2015-04-06 ENCOUNTER — Other Ambulatory Visit: Payer: Self-pay | Admitting: Adult Health

## 2015-04-08 ENCOUNTER — Ambulatory Visit (INDEPENDENT_AMBULATORY_CARE_PROVIDER_SITE_OTHER): Payer: Commercial Managed Care - HMO | Admitting: *Deleted

## 2015-04-08 DIAGNOSIS — I4891 Unspecified atrial fibrillation: Secondary | ICD-10-CM

## 2015-04-08 DIAGNOSIS — Z5181 Encounter for therapeutic drug level monitoring: Secondary | ICD-10-CM | POA: Diagnosis not present

## 2015-04-08 DIAGNOSIS — Z7901 Long term (current) use of anticoagulants: Secondary | ICD-10-CM | POA: Diagnosis not present

## 2015-04-08 LAB — POCT INR: INR: 3.9

## 2015-04-08 MED ORDER — ENOXAPARIN SODIUM 80 MG/0.8ML ~~LOC~~ SOLN: 80.0000 mg | Freq: Two times a day (BID) | SUBCUTANEOUS | Status: DC

## 2015-04-08 NOTE — Patient Instructions (Addendum)
04/13/15 Take your last dose of Coumadin   04/14/15 No Coumadin or Lovenox  04/15/15 Continue taking Lovenox 80mg  at 8a and 8p into the fatty abdominal tissue 2 inches away from navel and rotate sites  04/16/15 Continue taking Lovenox 80mg  at 8a and 8p into the fatty abdominal tissue 2 inches away from navel and rotate sites  04/17/15 Continue taking Lovenox 80mg  at 8a and 8p into the fatty abdominal tissue 2 inches away from navel and rotate sites  04/18/15 Take Lovenox 80mg  at [facility]  04/19/15 Procedure date- NO LOVENOX   Resume both Coumadin and Lovenox after procedure when instructed to do so by the Physician.  You will continue taking both until your appointment with Coumadin Clinic.  Call with any questions at [phone]

## 2015-04-13 ENCOUNTER — Telehealth: Payer: Self-pay | Admitting: *Deleted

## 2015-04-13 NOTE — Telephone Encounter (Signed)
Spoke with pt and he states that he got his lovenox injections last night and that he took injection of lovenox last night and has taken injection of Lovenox this morning Pt instructed that he was not to start Lovenox injections until Sept 16th so this nurse spoke with Jinny Blossom our pharmacist and she said to instruct pt not to take coumadin today and no more lovenox today the 14th and no coumadin nor Lovenox on the 15th and to follow instructions as on paper given to him in coumadin clinic on his visit of the 9th and these instructions given to pt and  he does state understanding of these instructions

## 2015-04-19 DIAGNOSIS — C61 Malignant neoplasm of prostate: Secondary | ICD-10-CM | POA: Diagnosis not present

## 2015-04-21 ENCOUNTER — Telehealth: Payer: Self-pay | Admitting: *Deleted

## 2015-04-21 NOTE — Telephone Encounter (Signed)
CALLED PATIENT TO REMIND OF SIM APPT. ON 04-22-15 @ 1 PM, SPOKE WITH PATIENT AND HE IS AWARE OF THIS APPT.

## 2015-04-22 ENCOUNTER — Ambulatory Visit
Admission: RE | Admit: 2015-04-22 | Discharge: 2015-04-22 | Disposition: A | Discharge status: Home / Self Care | Payer: Commercial Managed Care - HMO | Source: Ambulatory Visit | Attending: Radiation Oncology | Admitting: Radiation Oncology

## 2015-04-22 DIAGNOSIS — Z952 Presence of prosthetic heart valve: Secondary | ICD-10-CM | POA: Diagnosis not present

## 2015-04-22 DIAGNOSIS — D509 Iron deficiency anemia, unspecified: Secondary | ICD-10-CM | POA: Diagnosis not present

## 2015-04-22 DIAGNOSIS — C61 Malignant neoplasm of prostate: Secondary | ICD-10-CM | POA: Diagnosis not present

## 2015-04-22 DIAGNOSIS — I679 Cerebrovascular disease, unspecified: Secondary | ICD-10-CM | POA: Diagnosis not present

## 2015-04-22 DIAGNOSIS — J449 Chronic obstructive pulmonary disease, unspecified: Secondary | ICD-10-CM | POA: Diagnosis not present

## 2015-04-22 DIAGNOSIS — R918 Other nonspecific abnormal finding of lung field: Secondary | ICD-10-CM | POA: Diagnosis not present

## 2015-04-22 DIAGNOSIS — I4891 Unspecified atrial fibrillation: Secondary | ICD-10-CM | POA: Diagnosis not present

## 2015-04-22 DIAGNOSIS — I10 Essential (primary) hypertension: Secondary | ICD-10-CM | POA: Diagnosis not present

## 2015-04-22 DIAGNOSIS — Z510 Encounter for antineoplastic radiation therapy: Secondary | ICD-10-CM | POA: Diagnosis not present

## 2015-04-22 NOTE — Progress Notes (Signed)
  Radiation Oncology         [phone] ________________________________  Name: Jeffery Copeland MRN: [id number]  Date: 04/22/2015  DOB: [birth year]  SIMULATION AND TREATMENT PLANNING NOTE    ICD-9-CM ICD-10-CM   1. Malignant neoplasm of prostate 185 C61     DIAGNOSIS: 69 y.o. gentleman with stage T1c adenocarcinoma of the prostate with a Gleason's score of 3+4 and a PSA of 3.62  NARRATIVE:  The patient was brought to the [facility].  Identity was confirmed.  All relevant records and images related to the planned course of therapy were reviewed.  The patient freely provided informed written consent to proceed with treatment after reviewing the details related to the planned course of therapy. The consent form was witnessed and verified by the simulation staff.  Then, the patient was set-up in a stable reproducible supine position for radiation therapy.  A vacuum lock pillow device was custom fabricated to position his legs in a reproducible immobilized position.  Then, I performed a urethrogram under sterile conditions to identify the prostatic apex.  CT images were obtained.  Surface markings were placed.  The CT images were loaded into the planning software.  Then the prostate target and avoidance structures including the rectum, bladder, bowel and hips were contoured.  Treatment planning then occurred.  The radiation prescription was entered and confirmed.  A total of 1 complex treatment devices were fabricated. I have requested : Intensity Modulated Radiotherapy (IMRT) is medically necessary for this case for the following reason:  Rectal sparing.Marland Kitchen  PLAN:  The patient will receive 78 Gy in 40 fractions.  This document serves as a record of services personally performed by [name], MD. It was created on his behalf by Darcus Austin, a trained medical scribe. The creation of this record is based on the scribe's personal observations and the provider's statements to them. This  document has been checked and approved by the attending provider.     ________________________________  Sheral Apley. Tammi Klippel, M.D.

## 2015-04-25 ENCOUNTER — Ambulatory Visit (INDEPENDENT_AMBULATORY_CARE_PROVIDER_SITE_OTHER): Payer: Commercial Managed Care - HMO | Admitting: *Deleted

## 2015-04-25 DIAGNOSIS — Z7901 Long term (current) use of anticoagulants: Secondary | ICD-10-CM | POA: Diagnosis not present

## 2015-04-25 DIAGNOSIS — I4891 Unspecified atrial fibrillation: Secondary | ICD-10-CM | POA: Diagnosis not present

## 2015-04-25 DIAGNOSIS — Z5181 Encounter for therapeutic drug level monitoring: Secondary | ICD-10-CM

## 2015-04-25 LAB — POCT INR: INR: 1.3

## 2015-04-25 MED ORDER — ENOXAPARIN SODIUM 80 MG/0.8ML ~~LOC~~ SOLN: 80.0000 mg | Freq: Two times a day (BID) | SUBCUTANEOUS | Status: DC

## 2015-04-26 ENCOUNTER — Telehealth: Payer: Self-pay | Admitting: Cardiology

## 2015-04-26 NOTE — Telephone Encounter (Signed)
Prior authorization for Lovenox has been approved for patient on today.  A copy has been placed in the Medical Records basket to be scanned into the chart.  Called the pharmacy to verify that the will fill medication today so patient can pick it up.

## 2015-04-26 NOTE — Telephone Encounter (Signed)
New message      Calling to get prior approval on enoxaparin 80mg s.  Please call

## 2015-04-29 ENCOUNTER — Ambulatory Visit (INDEPENDENT_AMBULATORY_CARE_PROVIDER_SITE_OTHER): Payer: Commercial Managed Care - HMO | Admitting: *Deleted

## 2015-04-29 DIAGNOSIS — Z7901 Long term (current) use of anticoagulants: Secondary | ICD-10-CM | POA: Diagnosis not present

## 2015-04-29 DIAGNOSIS — Z5181 Encounter for therapeutic drug level monitoring: Secondary | ICD-10-CM

## 2015-04-29 DIAGNOSIS — I4891 Unspecified atrial fibrillation: Secondary | ICD-10-CM | POA: Diagnosis not present

## 2015-04-29 LAB — POCT INR: INR: 3.2

## 2015-05-01 DIAGNOSIS — C61 Malignant neoplasm of prostate: Secondary | ICD-10-CM | POA: Diagnosis not present

## 2015-05-02 DIAGNOSIS — R918 Other nonspecific abnormal finding of lung field: Secondary | ICD-10-CM | POA: Diagnosis not present

## 2015-05-02 DIAGNOSIS — I679 Cerebrovascular disease, unspecified: Secondary | ICD-10-CM | POA: Diagnosis not present

## 2015-05-02 DIAGNOSIS — Z952 Presence of prosthetic heart valve: Secondary | ICD-10-CM | POA: Diagnosis not present

## 2015-05-02 DIAGNOSIS — I10 Essential (primary) hypertension: Secondary | ICD-10-CM | POA: Diagnosis not present

## 2015-05-02 DIAGNOSIS — C61 Malignant neoplasm of prostate: Secondary | ICD-10-CM | POA: Diagnosis not present

## 2015-05-02 DIAGNOSIS — D509 Iron deficiency anemia, unspecified: Secondary | ICD-10-CM | POA: Diagnosis not present

## 2015-05-02 DIAGNOSIS — Z510 Encounter for antineoplastic radiation therapy: Secondary | ICD-10-CM | POA: Diagnosis not present

## 2015-05-02 DIAGNOSIS — J449 Chronic obstructive pulmonary disease, unspecified: Secondary | ICD-10-CM | POA: Diagnosis not present

## 2015-05-02 DIAGNOSIS — I4891 Unspecified atrial fibrillation: Secondary | ICD-10-CM | POA: Diagnosis not present

## 2015-05-04 ENCOUNTER — Ambulatory Visit (INDEPENDENT_AMBULATORY_CARE_PROVIDER_SITE_OTHER): Payer: Commercial Managed Care - HMO | Admitting: Pharmacist

## 2015-05-04 ENCOUNTER — Ambulatory Visit
Admission: RE | Admit: 2015-05-04 | Discharge: 2015-05-04 | Disposition: A | Discharge status: Home / Self Care | Payer: Commercial Managed Care - HMO | Source: Ambulatory Visit | Attending: Radiation Oncology | Admitting: Radiation Oncology

## 2015-05-04 ENCOUNTER — Ambulatory Visit (INDEPENDENT_AMBULATORY_CARE_PROVIDER_SITE_OTHER): Payer: Commercial Managed Care - HMO | Admitting: Adult Health

## 2015-05-04 ENCOUNTER — Encounter: Payer: Self-pay | Admitting: Adult Health

## 2015-05-04 VITALS — BP 167/72 | HR 67 | Ht 69.0 in | Wt 165.0 lb

## 2015-05-04 DIAGNOSIS — Z5181 Encounter for therapeutic drug level monitoring: Secondary | ICD-10-CM

## 2015-05-04 DIAGNOSIS — I4891 Unspecified atrial fibrillation: Secondary | ICD-10-CM | POA: Diagnosis not present

## 2015-05-04 DIAGNOSIS — J449 Chronic obstructive pulmonary disease, unspecified: Secondary | ICD-10-CM | POA: Diagnosis not present

## 2015-05-04 DIAGNOSIS — Z79899 Other long term (current) drug therapy: Secondary | ICD-10-CM | POA: Diagnosis not present

## 2015-05-04 DIAGNOSIS — D509 Iron deficiency anemia, unspecified: Secondary | ICD-10-CM | POA: Diagnosis not present

## 2015-05-04 DIAGNOSIS — I679 Cerebrovascular disease, unspecified: Secondary | ICD-10-CM | POA: Diagnosis not present

## 2015-05-04 DIAGNOSIS — Z7901 Long term (current) use of anticoagulants: Secondary | ICD-10-CM | POA: Diagnosis not present

## 2015-05-04 DIAGNOSIS — I10 Essential (primary) hypertension: Secondary | ICD-10-CM | POA: Diagnosis not present

## 2015-05-04 DIAGNOSIS — R569 Unspecified convulsions: Secondary | ICD-10-CM | POA: Diagnosis not present

## 2015-05-04 DIAGNOSIS — C61 Malignant neoplasm of prostate: Secondary | ICD-10-CM | POA: Diagnosis not present

## 2015-05-04 DIAGNOSIS — R918 Other nonspecific abnormal finding of lung field: Secondary | ICD-10-CM | POA: Diagnosis not present

## 2015-05-04 DIAGNOSIS — Z952 Presence of prosthetic heart valve: Secondary | ICD-10-CM | POA: Diagnosis not present

## 2015-05-04 DIAGNOSIS — Z510 Encounter for antineoplastic radiation therapy: Secondary | ICD-10-CM | POA: Diagnosis not present

## 2015-05-04 LAB — POCT INR: INR: 2.6

## 2015-05-04 NOTE — Progress Notes (Signed)
I reviewed above note and agree with the assessment and plan.  Rosalin Hawking, MD PhD Stroke Neurology 05/04/2015 9:13 AM
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
## 2015-05-04 NOTE — Patient Instructions (Signed)
Continue Dilantin 300 mg daily Please come back in the next 1-2 weeks to have blood work. Do not take Dilantin before blood work. If you have any seizure events please let us know.

## 2015-05-05 ENCOUNTER — Encounter: Payer: Self-pay | Admitting: Radiation Oncology

## 2015-05-05 ENCOUNTER — Ambulatory Visit
Admission: RE | Admit: 2015-05-05 | Discharge: 2015-05-05 | Disposition: A | Discharge status: Home / Self Care | Payer: Commercial Managed Care - HMO | Source: Ambulatory Visit | Attending: Radiation Oncology | Admitting: Radiation Oncology

## 2015-05-05 DIAGNOSIS — J449 Chronic obstructive pulmonary disease, unspecified: Secondary | ICD-10-CM | POA: Diagnosis not present

## 2015-05-05 DIAGNOSIS — I10 Essential (primary) hypertension: Secondary | ICD-10-CM | POA: Diagnosis not present

## 2015-05-05 DIAGNOSIS — Z510 Encounter for antineoplastic radiation therapy: Secondary | ICD-10-CM | POA: Diagnosis not present

## 2015-05-05 DIAGNOSIS — C61 Malignant neoplasm of prostate: Secondary | ICD-10-CM | POA: Diagnosis not present

## 2015-05-05 DIAGNOSIS — I679 Cerebrovascular disease, unspecified: Secondary | ICD-10-CM | POA: Diagnosis not present

## 2015-05-05 DIAGNOSIS — I4891 Unspecified atrial fibrillation: Secondary | ICD-10-CM | POA: Diagnosis not present

## 2015-05-05 DIAGNOSIS — Z952 Presence of prosthetic heart valve: Secondary | ICD-10-CM | POA: Diagnosis not present

## 2015-05-05 DIAGNOSIS — R918 Other nonspecific abnormal finding of lung field: Secondary | ICD-10-CM | POA: Diagnosis not present

## 2015-05-05 DIAGNOSIS — D509 Iron deficiency anemia, unspecified: Secondary | ICD-10-CM | POA: Diagnosis not present

## 2015-05-06 ENCOUNTER — Ambulatory Visit
Admission: RE | Admit: 2015-05-06 | Discharge: 2015-05-06 | Disposition: A | Discharge status: Home / Self Care | Payer: Commercial Managed Care - HMO | Source: Ambulatory Visit | Attending: Radiation Oncology | Admitting: Radiation Oncology

## 2015-05-06 ENCOUNTER — Encounter: Payer: Self-pay | Admitting: Radiation Oncology

## 2015-05-06 VITALS — BP 159/104 | HR 63 | Temp 97.9°F | Resp 16 | Ht 69.0 in | Wt 168.1 lb

## 2015-05-06 DIAGNOSIS — C61 Malignant neoplasm of prostate: Secondary | ICD-10-CM | POA: Diagnosis not present

## 2015-05-06 DIAGNOSIS — I4891 Unspecified atrial fibrillation: Secondary | ICD-10-CM | POA: Diagnosis not present

## 2015-05-06 DIAGNOSIS — I679 Cerebrovascular disease, unspecified: Secondary | ICD-10-CM | POA: Diagnosis not present

## 2015-05-06 DIAGNOSIS — Z510 Encounter for antineoplastic radiation therapy: Secondary | ICD-10-CM | POA: Diagnosis not present

## 2015-05-06 DIAGNOSIS — I10 Essential (primary) hypertension: Secondary | ICD-10-CM | POA: Diagnosis not present

## 2015-05-06 DIAGNOSIS — J449 Chronic obstructive pulmonary disease, unspecified: Secondary | ICD-10-CM | POA: Diagnosis not present

## 2015-05-06 DIAGNOSIS — Z952 Presence of prosthetic heart valve: Secondary | ICD-10-CM | POA: Diagnosis not present

## 2015-05-06 DIAGNOSIS — R918 Other nonspecific abnormal finding of lung field: Secondary | ICD-10-CM | POA: Diagnosis not present

## 2015-05-06 DIAGNOSIS — D509 Iron deficiency anemia, unspecified: Secondary | ICD-10-CM | POA: Diagnosis not present

## 2015-05-06 NOTE — Progress Notes (Signed)
Department of Radiation Oncology  Phone:  [phone] Fax:        [phone]  Weekly Treatment Note    Name: [name] Date: 05/06/2015 MRN: [id number] DOB: [birth year]   Current dose: 5.85 Gy  Current fraction: 3   MEDICATIONS: Current Outpatient Prescriptions  Medication Sig Dispense Refill  . Cholecalciferol (VITAMIN D3) 1000 UNITS CAPS Take 1 capsule by mouth daily.     Marland Kitchen ezetimibe (ZETIA) 10 MG tablet Take 1 tablet (10 mg total) by mouth daily. 30 tablet 6  . methocarbamol (ROBAXIN) 500 MG tablet Take 500 mg by mouth 2 (two) times daily.    . metoprolol tartrate (LOPRESSOR) 25 MG tablet TAKE 1/2 TABLET BY MOUTH TWICE A DAY 30 tablet 4  . oxyCODONE (OXY IR/ROXICODONE) 5 MG immediate release tablet Take 1 tablet by mouth every 6 (six) hours as needed.    . phenytoin (DILANTIN) 100 MG ER capsule TAKE 3 CAPSULES EVERY DAY 270 capsule 0  . ramipril (ALTACE) 5 MG capsule TAKE ONE CAPSULE BY MOUTH EVERY DAY 30 capsule 4  . tiotropium (SPIRIVA HANDIHALER) 18 MCG inhalation capsule PLACE 1 CAPSULE INTO INHALER & INHALE DAILY 90 capsule 4  . warfarin (COUMADIN) 5 MG tablet TAKE AS DIRECTED BY ANTICOAGULATION CLINIC 100 tablet 0  . zolpidem (AMBIEN) 5 MG tablet Take 5 mg by mouth at bedtime as needed. For insomnia.     No current facility-administered medications for this encounter.     ALLERGIES: Rosuvastatin   LABORATORY DATA:  Lab Results  Component Value Date   WBC 5.9 08/05/2014   HGB 13.4 08/05/2014   HCT 40.4 08/05/2014   MCV 94 08/05/2014   PLT 194 08/05/2014   Lab Results  Component Value Date   NA 136 03/30/2015   K 4.2 03/30/2015   CL 101 03/30/2015   CO2 29 03/30/2015   Lab Results  Component Value Date   ALT 11 02/28/2014   AST 17 02/28/2014   ALKPHOS 98 02/28/2014   BILITOT 0.8 02/28/2014     NARRATIVE: Jeffery Copeland was seen today for weekly treatment management. He reports always having urinary frquency.  He usually gets up 0-3 times  per night to urinate.  He reports that he had blood and clots in his stool about 2 weeks ago. " It come Saturday night and quit on Saturday evening, like having diarrhea," the patient stated. His blood pressure was high today at 159/104, but he stated he has not taken his blood pressure medications yet. The patient was provided with a Radiation Therapy and You booklet with the appropriate pages folded down for his review. He presents with a heavy dialect. The charts were checked and scans reviewed. The patient projected a healthy mental status and was not accompanied by family for today's radiation oncology appointment. He reports no new symptoms of pain and is otherwise without complaint.  PHYSICAL EXAMINATION: height is 5\' 9"  (1.753 m) and weight is 168 lb 1.6 oz (76.25 kg). His oral temperature is 97.9 F (36.6 C). His blood pressure is 159/104 and his pulse is 63. His respiration is 16.    ASSESSMENT: Jeffery Copeland is a 69 year old male presenting to clinic in regards to his prostate cancer. The patient is doing satisfactorily with treatment. He is managing reported symptoms appropriately. He understands that if his symptoms of reported blood in the stool returns or worsens, to contact medical services immediatly. He was pleased with the Radiation Therapy and You booklet  provided at today's appointment. The patient understands that he can access his appointments and medical records via [facility].    PLAN: Healthy methods of management in regards to reported symptoms were reviewed in detail. The patient is advised to continue radiation treatment as planned. The patient is advised of his follow-up appointment with radiation oncology to take place as scheduled. All vocalized questions and concerns have been addressed. If the patient develops any further questions or concerns in regards to his treatment and recovery, he has been encouraged to contact Dr. Lisbeth Renshaw, MD.    This document serves as a record of  services personally performed by Kyung Rudd, MD. It was created on his behalf by Lenn Cal, a trained medical scribe. The creation of this record is based on the scribe's personal observations and the provider's statements to them. This document has been checked and approved by the attending provider.  ------------------------------------------------  Jodelle Gross, MD, PhD

## 2015-05-06 NOTE — Progress Notes (Addendum)
Jeffery Copeland has completed 3 fractions to his prostate.  He denies having any new pain.  He reports always having urinary frquency.  He usually gets up 0-3 times per night to urinate.  He reports that he had blood and clots in his urine 2 weeks ago.  He called his urologist and was told this was normal.  He said he last saw blood last Saturday.  He reports his bowels are moving ok.  His bp was high today at 159/104.  He said he has not taken his bp medications yet today.  He has been given the Radiation Therapy and You book with the appropriate pages folded down for his review.  BP 159/104 mmHg  Pulse 63  Temp(Src) 97.9 F (36.6 C) (Oral)  Resp 16  Ht 5\' 9"  (1.753 m)  Wt 168 lb 1.6 oz (76.25 kg)  BMI 24.81 kg/m2

## 2015-05-09 ENCOUNTER — Ambulatory Visit
Admission: RE | Admit: 2015-05-09 | Discharge: 2015-05-09 | Disposition: A | Discharge status: Home / Self Care | Payer: Commercial Managed Care - HMO | Source: Ambulatory Visit | Attending: Radiation Oncology | Admitting: Radiation Oncology

## 2015-05-09 DIAGNOSIS — R918 Other nonspecific abnormal finding of lung field: Secondary | ICD-10-CM | POA: Diagnosis not present

## 2015-05-09 DIAGNOSIS — I4891 Unspecified atrial fibrillation: Secondary | ICD-10-CM | POA: Diagnosis not present

## 2015-05-09 DIAGNOSIS — J449 Chronic obstructive pulmonary disease, unspecified: Secondary | ICD-10-CM | POA: Diagnosis not present

## 2015-05-09 DIAGNOSIS — Z510 Encounter for antineoplastic radiation therapy: Secondary | ICD-10-CM | POA: Diagnosis not present

## 2015-05-09 DIAGNOSIS — Z952 Presence of prosthetic heart valve: Secondary | ICD-10-CM | POA: Diagnosis not present

## 2015-05-09 DIAGNOSIS — D509 Iron deficiency anemia, unspecified: Secondary | ICD-10-CM | POA: Diagnosis not present

## 2015-05-09 DIAGNOSIS — I10 Essential (primary) hypertension: Secondary | ICD-10-CM | POA: Diagnosis not present

## 2015-05-09 DIAGNOSIS — I679 Cerebrovascular disease, unspecified: Secondary | ICD-10-CM | POA: Diagnosis not present

## 2015-05-09 DIAGNOSIS — C61 Malignant neoplasm of prostate: Secondary | ICD-10-CM | POA: Diagnosis not present

## 2015-05-10 ENCOUNTER — Ambulatory Visit
Admission: RE | Admit: 2015-05-10 | Discharge: 2015-05-10 | Disposition: A | Discharge status: Home / Self Care | Payer: Commercial Managed Care - HMO | Source: Ambulatory Visit | Attending: Radiation Oncology | Admitting: Radiation Oncology

## 2015-05-10 DIAGNOSIS — I4891 Unspecified atrial fibrillation: Secondary | ICD-10-CM | POA: Diagnosis not present

## 2015-05-10 DIAGNOSIS — Z952 Presence of prosthetic heart valve: Secondary | ICD-10-CM | POA: Diagnosis not present

## 2015-05-10 DIAGNOSIS — R918 Other nonspecific abnormal finding of lung field: Secondary | ICD-10-CM | POA: Diagnosis not present

## 2015-05-10 DIAGNOSIS — I679 Cerebrovascular disease, unspecified: Secondary | ICD-10-CM | POA: Diagnosis not present

## 2015-05-10 DIAGNOSIS — D509 Iron deficiency anemia, unspecified: Secondary | ICD-10-CM | POA: Diagnosis not present

## 2015-05-10 DIAGNOSIS — I10 Essential (primary) hypertension: Secondary | ICD-10-CM | POA: Diagnosis not present

## 2015-05-10 DIAGNOSIS — J449 Chronic obstructive pulmonary disease, unspecified: Secondary | ICD-10-CM | POA: Diagnosis not present

## 2015-05-10 DIAGNOSIS — Z510 Encounter for antineoplastic radiation therapy: Secondary | ICD-10-CM | POA: Diagnosis not present

## 2015-05-10 DIAGNOSIS — C61 Malignant neoplasm of prostate: Secondary | ICD-10-CM | POA: Diagnosis not present

## 2015-05-11 ENCOUNTER — Ambulatory Visit
Admission: RE | Admit: 2015-05-11 | Discharge: 2015-05-11 | Disposition: A | Discharge status: Home / Self Care | Payer: Commercial Managed Care - HMO | Source: Ambulatory Visit | Attending: Radiation Oncology | Admitting: Radiation Oncology

## 2015-05-11 DIAGNOSIS — Z510 Encounter for antineoplastic radiation therapy: Secondary | ICD-10-CM | POA: Diagnosis not present

## 2015-05-11 DIAGNOSIS — D509 Iron deficiency anemia, unspecified: Secondary | ICD-10-CM | POA: Diagnosis not present

## 2015-05-11 DIAGNOSIS — C61 Malignant neoplasm of prostate: Secondary | ICD-10-CM | POA: Diagnosis not present

## 2015-05-11 DIAGNOSIS — R918 Other nonspecific abnormal finding of lung field: Secondary | ICD-10-CM | POA: Diagnosis not present

## 2015-05-11 DIAGNOSIS — I679 Cerebrovascular disease, unspecified: Secondary | ICD-10-CM | POA: Diagnosis not present

## 2015-05-11 DIAGNOSIS — Z952 Presence of prosthetic heart valve: Secondary | ICD-10-CM | POA: Diagnosis not present

## 2015-05-11 DIAGNOSIS — I10 Essential (primary) hypertension: Secondary | ICD-10-CM | POA: Diagnosis not present

## 2015-05-11 DIAGNOSIS — J449 Chronic obstructive pulmonary disease, unspecified: Secondary | ICD-10-CM | POA: Diagnosis not present

## 2015-05-11 DIAGNOSIS — I4891 Unspecified atrial fibrillation: Secondary | ICD-10-CM | POA: Diagnosis not present

## 2015-05-12 ENCOUNTER — Encounter: Payer: Self-pay | Admitting: Radiation Oncology

## 2015-05-12 ENCOUNTER — Ambulatory Visit
Admission: RE | Admit: 2015-05-12 | Discharge: 2015-05-12 | Disposition: A | Discharge status: Home / Self Care | Payer: Commercial Managed Care - HMO | Source: Ambulatory Visit | Attending: Radiation Oncology | Admitting: Radiation Oncology

## 2015-05-12 VITALS — BP 166/85 | HR 80 | Resp 16 | Wt 167.2 lb

## 2015-05-12 DIAGNOSIS — R918 Other nonspecific abnormal finding of lung field: Secondary | ICD-10-CM | POA: Diagnosis not present

## 2015-05-12 DIAGNOSIS — I679 Cerebrovascular disease, unspecified: Secondary | ICD-10-CM | POA: Diagnosis not present

## 2015-05-12 DIAGNOSIS — Z952 Presence of prosthetic heart valve: Secondary | ICD-10-CM | POA: Diagnosis not present

## 2015-05-12 DIAGNOSIS — J449 Chronic obstructive pulmonary disease, unspecified: Secondary | ICD-10-CM | POA: Diagnosis not present

## 2015-05-12 DIAGNOSIS — I4891 Unspecified atrial fibrillation: Secondary | ICD-10-CM | POA: Diagnosis not present

## 2015-05-12 DIAGNOSIS — C61 Malignant neoplasm of prostate: Secondary | ICD-10-CM | POA: Diagnosis not present

## 2015-05-12 DIAGNOSIS — I10 Essential (primary) hypertension: Secondary | ICD-10-CM | POA: Diagnosis not present

## 2015-05-12 DIAGNOSIS — Z510 Encounter for antineoplastic radiation therapy: Secondary | ICD-10-CM | POA: Diagnosis not present

## 2015-05-12 DIAGNOSIS — D509 Iron deficiency anemia, unspecified: Secondary | ICD-10-CM | POA: Diagnosis not present

## 2015-05-12 NOTE — Progress Notes (Signed)
Weight and vitals stable. Denies pain. Reports nocturia x 2. Denies dysuria or hematuria. Denies diarrhea. Denies incontinence or urgency. Reports fatigue.  BP 166/85 mmHg  Pulse 80  Resp 16  Wt 167 lb 3.2 oz (75.841 kg)  SpO2 100% Wt Readings from Last 3 Encounters:  05/12/15 167 lb 3.2 oz (75.841 kg)  05/06/15 168 lb 1.6 oz (76.25 kg)  05/04/15 165 lb (74.844 kg)

## 2015-05-12 NOTE — Progress Notes (Signed)
  Radiation Oncology         [phone]   Name: Jeffery Copeland MRN: [id number]   Date: 05/12/2015  DOB: 09-Jul-1946     Weekly Radiation Therapy Management    ICD-9-CM ICD-10-CM   1. Malignant neoplasm of prostate (HCC) 185 C61     Current Dose: 13.65 Gy  Planned Dose:  78 Gy  Narrative The patient presents for routine under treatment assessment.  Weight and vitals stable. Denies pain. Reports nocturia x 2. Denies dysuria or hematuria. Denies diarrhea. Denies incontinence or urgency. Reports fatigue.  The patient is without complaint. Set-up films were reviewed. The chart was checked.  Physical Findings  weight is 167 lb 3.2 oz (75.841 kg). His blood pressure is 166/85 and his pulse is 80. His respiration is 16 and oxygen saturation is 100%. . Weight essentially stable.  No significant changes.  Impression The patient is tolerating radiation.  Plan Continue treatment as planned.    This document serves as a record of services personally performed by [name], MD. It was created on his behalf by Arlyce Harman, a trained medical scribe. The creation of this record is based on the scribe's personal observations and the provider's statements to them. This document has been checked and approved by the attending provider.       Sheral Apley Tammi Klippel, M.D.

## 2015-05-13 ENCOUNTER — Ambulatory Visit
Admission: RE | Admit: 2015-05-13 | Discharge: 2015-05-13 | Disposition: A | Discharge status: Home / Self Care | Payer: Commercial Managed Care - HMO | Source: Ambulatory Visit | Attending: Radiation Oncology | Admitting: Radiation Oncology

## 2015-05-13 ENCOUNTER — Other Ambulatory Visit: Payer: Self-pay | Admitting: *Deleted

## 2015-05-13 DIAGNOSIS — Z510 Encounter for antineoplastic radiation therapy: Secondary | ICD-10-CM | POA: Diagnosis not present

## 2015-05-13 DIAGNOSIS — R918 Other nonspecific abnormal finding of lung field: Secondary | ICD-10-CM | POA: Diagnosis not present

## 2015-05-13 DIAGNOSIS — D509 Iron deficiency anemia, unspecified: Secondary | ICD-10-CM | POA: Diagnosis not present

## 2015-05-13 DIAGNOSIS — Z952 Presence of prosthetic heart valve: Secondary | ICD-10-CM | POA: Diagnosis not present

## 2015-05-13 DIAGNOSIS — I10 Essential (primary) hypertension: Secondary | ICD-10-CM | POA: Diagnosis not present

## 2015-05-13 DIAGNOSIS — C61 Malignant neoplasm of prostate: Secondary | ICD-10-CM | POA: Diagnosis not present

## 2015-05-13 DIAGNOSIS — J449 Chronic obstructive pulmonary disease, unspecified: Secondary | ICD-10-CM | POA: Diagnosis not present

## 2015-05-13 DIAGNOSIS — I4891 Unspecified atrial fibrillation: Secondary | ICD-10-CM | POA: Diagnosis not present

## 2015-05-13 DIAGNOSIS — I679 Cerebrovascular disease, unspecified: Secondary | ICD-10-CM | POA: Diagnosis not present

## 2015-05-13 MED ORDER — WARFARIN SODIUM 5 MG PO TABS: ORAL_TABLET | ORAL | Status: DC

## 2015-05-16 ENCOUNTER — Ambulatory Visit
Admission: RE | Admit: 2015-05-16 | Discharge: 2015-05-16 | Disposition: A | Discharge status: Home / Self Care | Payer: Commercial Managed Care - HMO | Source: Ambulatory Visit | Attending: Radiation Oncology | Admitting: Radiation Oncology

## 2015-05-16 DIAGNOSIS — Z510 Encounter for antineoplastic radiation therapy: Secondary | ICD-10-CM | POA: Diagnosis not present

## 2015-05-16 DIAGNOSIS — I4891 Unspecified atrial fibrillation: Secondary | ICD-10-CM | POA: Diagnosis not present

## 2015-05-16 DIAGNOSIS — I679 Cerebrovascular disease, unspecified: Secondary | ICD-10-CM | POA: Diagnosis not present

## 2015-05-16 DIAGNOSIS — J449 Chronic obstructive pulmonary disease, unspecified: Secondary | ICD-10-CM | POA: Diagnosis not present

## 2015-05-16 DIAGNOSIS — Z952 Presence of prosthetic heart valve: Secondary | ICD-10-CM | POA: Diagnosis not present

## 2015-05-16 DIAGNOSIS — C61 Malignant neoplasm of prostate: Secondary | ICD-10-CM | POA: Diagnosis not present

## 2015-05-16 DIAGNOSIS — I10 Essential (primary) hypertension: Secondary | ICD-10-CM | POA: Diagnosis not present

## 2015-05-16 DIAGNOSIS — R918 Other nonspecific abnormal finding of lung field: Secondary | ICD-10-CM | POA: Diagnosis not present

## 2015-05-16 DIAGNOSIS — D509 Iron deficiency anemia, unspecified: Secondary | ICD-10-CM | POA: Diagnosis not present

## 2015-05-17 ENCOUNTER — Ambulatory Visit
Admission: RE | Admit: 2015-05-17 | Discharge: 2015-05-17 | Disposition: A | Discharge status: Home / Self Care | Payer: Commercial Managed Care - HMO | Source: Ambulatory Visit | Attending: Radiation Oncology | Admitting: Radiation Oncology

## 2015-05-17 DIAGNOSIS — Z952 Presence of prosthetic heart valve: Secondary | ICD-10-CM | POA: Diagnosis not present

## 2015-05-17 DIAGNOSIS — I679 Cerebrovascular disease, unspecified: Secondary | ICD-10-CM | POA: Diagnosis not present

## 2015-05-17 DIAGNOSIS — D509 Iron deficiency anemia, unspecified: Secondary | ICD-10-CM | POA: Diagnosis not present

## 2015-05-17 DIAGNOSIS — Z510 Encounter for antineoplastic radiation therapy: Secondary | ICD-10-CM | POA: Diagnosis not present

## 2015-05-17 DIAGNOSIS — R918 Other nonspecific abnormal finding of lung field: Secondary | ICD-10-CM | POA: Diagnosis not present

## 2015-05-17 DIAGNOSIS — C61 Malignant neoplasm of prostate: Secondary | ICD-10-CM | POA: Diagnosis not present

## 2015-05-17 DIAGNOSIS — J449 Chronic obstructive pulmonary disease, unspecified: Secondary | ICD-10-CM | POA: Diagnosis not present

## 2015-05-17 DIAGNOSIS — I4891 Unspecified atrial fibrillation: Secondary | ICD-10-CM | POA: Diagnosis not present

## 2015-05-17 DIAGNOSIS — I10 Essential (primary) hypertension: Secondary | ICD-10-CM | POA: Diagnosis not present

## 2015-05-18 ENCOUNTER — Telehealth: Payer: Self-pay | Admitting: Adult Health

## 2015-05-18 ENCOUNTER — Ambulatory Visit
Admission: RE | Admit: 2015-05-18 | Discharge: 2015-05-18 | Disposition: A | Discharge status: Home / Self Care | Payer: Commercial Managed Care - HMO | Source: Ambulatory Visit | Attending: Radiation Oncology | Admitting: Radiation Oncology

## 2015-05-18 DIAGNOSIS — D509 Iron deficiency anemia, unspecified: Secondary | ICD-10-CM | POA: Diagnosis not present

## 2015-05-18 DIAGNOSIS — J449 Chronic obstructive pulmonary disease, unspecified: Secondary | ICD-10-CM | POA: Diagnosis not present

## 2015-05-18 DIAGNOSIS — I10 Essential (primary) hypertension: Secondary | ICD-10-CM | POA: Diagnosis not present

## 2015-05-18 DIAGNOSIS — Z510 Encounter for antineoplastic radiation therapy: Secondary | ICD-10-CM | POA: Diagnosis not present

## 2015-05-18 DIAGNOSIS — C61 Malignant neoplasm of prostate: Secondary | ICD-10-CM | POA: Diagnosis not present

## 2015-05-18 DIAGNOSIS — I4891 Unspecified atrial fibrillation: Secondary | ICD-10-CM | POA: Diagnosis not present

## 2015-05-18 DIAGNOSIS — R918 Other nonspecific abnormal finding of lung field: Secondary | ICD-10-CM | POA: Diagnosis not present

## 2015-05-18 DIAGNOSIS — I679 Cerebrovascular disease, unspecified: Secondary | ICD-10-CM | POA: Diagnosis not present

## 2015-05-18 DIAGNOSIS — Z952 Presence of prosthetic heart valve: Secondary | ICD-10-CM | POA: Diagnosis not present

## 2015-05-18 NOTE — Telephone Encounter (Signed)
Called patient. No answer. Will try later.  

## 2015-05-18 NOTE — Telephone Encounter (Signed)
Please call the patient and remind him to come in to have his lab work completed. Remind him to have the lab work prior to him taking his dilantin. Orders are placed.

## 2015-05-18 NOTE — Telephone Encounter (Deleted)
-----   Message from Ward Givens, NP sent at 05/04/2015  4:23 PM EDT ----- Regarding: dilantin  Orders placed for dilantin lab work. Check to see if patient completed.

## 2015-05-19 ENCOUNTER — Ambulatory Visit
Admission: RE | Admit: 2015-05-19 | Discharge: 2015-05-19 | Disposition: A | Discharge status: Home / Self Care | Payer: Commercial Managed Care - HMO | Source: Ambulatory Visit | Attending: Radiation Oncology | Admitting: Radiation Oncology

## 2015-05-19 DIAGNOSIS — I679 Cerebrovascular disease, unspecified: Secondary | ICD-10-CM | POA: Diagnosis not present

## 2015-05-19 DIAGNOSIS — C61 Malignant neoplasm of prostate: Secondary | ICD-10-CM | POA: Diagnosis not present

## 2015-05-19 DIAGNOSIS — D509 Iron deficiency anemia, unspecified: Secondary | ICD-10-CM | POA: Diagnosis not present

## 2015-05-19 DIAGNOSIS — I4891 Unspecified atrial fibrillation: Secondary | ICD-10-CM | POA: Diagnosis not present

## 2015-05-19 DIAGNOSIS — J449 Chronic obstructive pulmonary disease, unspecified: Secondary | ICD-10-CM | POA: Diagnosis not present

## 2015-05-19 DIAGNOSIS — R918 Other nonspecific abnormal finding of lung field: Secondary | ICD-10-CM | POA: Diagnosis not present

## 2015-05-19 DIAGNOSIS — Z952 Presence of prosthetic heart valve: Secondary | ICD-10-CM | POA: Diagnosis not present

## 2015-05-19 DIAGNOSIS — Z510 Encounter for antineoplastic radiation therapy: Secondary | ICD-10-CM | POA: Diagnosis not present

## 2015-05-19 DIAGNOSIS — I10 Essential (primary) hypertension: Secondary | ICD-10-CM | POA: Diagnosis not present

## 2015-05-19 NOTE — Telephone Encounter (Signed)
Spoke to patient. Gave instructions per previous note. Patient verbalized understanding.  Patient stated he would be in 05/25/15 b/c he has another appt in the area the same day.

## 2015-05-20 ENCOUNTER — Encounter: Payer: Self-pay | Admitting: Radiation Oncology

## 2015-05-20 ENCOUNTER — Ambulatory Visit
Admission: RE | Admit: 2015-05-20 | Discharge: 2015-05-20 | Disposition: A | Discharge status: Home / Self Care | Payer: Commercial Managed Care - HMO | Source: Ambulatory Visit | Attending: Radiation Oncology | Admitting: Radiation Oncology

## 2015-05-20 VITALS — BP 132/60 | HR 62 | Temp 98.2°F | Resp 16 | Wt 163.0 lb

## 2015-05-20 DIAGNOSIS — Z952 Presence of prosthetic heart valve: Secondary | ICD-10-CM | POA: Diagnosis not present

## 2015-05-20 DIAGNOSIS — I679 Cerebrovascular disease, unspecified: Secondary | ICD-10-CM | POA: Diagnosis not present

## 2015-05-20 DIAGNOSIS — D509 Iron deficiency anemia, unspecified: Secondary | ICD-10-CM | POA: Diagnosis not present

## 2015-05-20 DIAGNOSIS — C61 Malignant neoplasm of prostate: Secondary | ICD-10-CM | POA: Diagnosis not present

## 2015-05-20 DIAGNOSIS — I10 Essential (primary) hypertension: Secondary | ICD-10-CM | POA: Diagnosis not present

## 2015-05-20 DIAGNOSIS — R918 Other nonspecific abnormal finding of lung field: Secondary | ICD-10-CM | POA: Diagnosis not present

## 2015-05-20 DIAGNOSIS — J449 Chronic obstructive pulmonary disease, unspecified: Secondary | ICD-10-CM | POA: Diagnosis not present

## 2015-05-20 DIAGNOSIS — I4891 Unspecified atrial fibrillation: Secondary | ICD-10-CM | POA: Diagnosis not present

## 2015-05-20 DIAGNOSIS — Z510 Encounter for antineoplastic radiation therapy: Secondary | ICD-10-CM | POA: Diagnosis not present

## 2015-05-20 NOTE — Progress Notes (Signed)
Weekly rad txs prostate 13/40 completed, frequency and urgency voiding, nno hematuria, regular bowels , nocturia 2-4x night, appetite good,  Get tired at times get sob at times , oxygen level on room air=100% 9:28 AM BP 132/60 mmHg  Pulse 62  Temp(Src) 98.2 F (36.8 C) (Oral)  Resp 16  Wt 163 lb (73.936 kg)  Wt Readings from Last 3 Encounters:  05/20/15 163 lb (73.936 kg)  05/12/15 167 lb 3.2 oz (75.841 kg)  05/06/15 168 lb 1.6 oz (76.25 kg)

## 2015-05-20 NOTE — Progress Notes (Signed)
   Department of Radiation Oncology  Phone:  [phone] Fax:        [phone]  Weekly Treatment Note    Name: [name] Date: 05/20/2015 MRN: [id number] DOB: Oct 12, 1945   Current dose: 23.35 Gy  Current fraction:13   MEDICATIONS: Current Outpatient Prescriptions  Medication Sig Dispense Refill  . Cholecalciferol (VITAMIN D3) 1000 UNITS CAPS Take 1 capsule by mouth daily.     Marland Kitchen ezetimibe (ZETIA) 10 MG tablet Take 1 tablet (10 mg total) by mouth daily. 30 tablet 6  . methocarbamol (ROBAXIN) 500 MG tablet Take 500 mg by mouth 2 (two) times daily.    . metoprolol tartrate (LOPRESSOR) 25 MG tablet TAKE 1/2 TABLET BY MOUTH TWICE A DAY 30 tablet 4  . oxyCODONE (OXY IR/ROXICODONE) 5 MG immediate release tablet Take 1 tablet by mouth every 6 (six) hours as needed.    . phenytoin (DILANTIN) 100 MG ER capsule TAKE 3 CAPSULES EVERY DAY 270 capsule 0  . ramipril (ALTACE) 5 MG capsule TAKE ONE CAPSULE BY MOUTH EVERY DAY 30 capsule 4  . tiotropium (SPIRIVA HANDIHALER) 18 MCG inhalation capsule PLACE 1 CAPSULE INTO INHALER & INHALE DAILY 90 capsule 4  . warfarin (COUMADIN) 5 MG tablet TAKE AS DIRECTED BY ANTICOAGULATION CLINIC 100 tablet 1  . zolpidem (AMBIEN) 5 MG tablet Take 5 mg by mouth at bedtime as needed. For insomnia.     No current facility-administered medications for this encounter.     ALLERGIES: Rosuvastatin   LABORATORY DATA:  Lab Results  Component Value Date   WBC 5.9 08/05/2014   HGB 13.4 08/05/2014   HCT 40.4 08/05/2014   MCV 94 08/05/2014   PLT 194 08/05/2014   Lab Results  Component Value Date   NA 136 03/30/2015   K 4.2 03/30/2015   CL 101 03/30/2015   CO2 29 03/30/2015   Lab Results  Component Value Date   ALT 11 02/28/2014   AST 17 02/28/2014   ALKPHOS 98 02/28/2014   BILITOT 0.8 02/28/2014     NARRATIVE: Jeffery Copeland was seen today for weekly treatment management. The chart was checked and the patient's films were reviewed.  Weekly  radiation treatments to prostate, 13/40 completed. Reports frequency and urgency voiding. No hematuria. Regular bowels. Nocturia 2-4x night. Appetite good, gets tired at times. Reports shortness of breath at times, oxygen level on room air=100%.    PHYSICAL EXAMINATION: weight is 163 lb (73.936 kg). His oral temperature is 98.2 F (36.8 C). His blood pressure is 132/60 and his pulse is 62. His respiration is 16 and oxygen saturation is 100%.        ASSESSMENT: The patient is doing satisfactorily with treatment.  PLAN: We will continue with the patient's radiation treatment as planned.   This document serves as a record of services personally performed by Kyung Rudd, MD. It was created on his behalf by Arlyce Harman, a trained medical scribe. The creation of this record is based on the scribe's personal observations and the provider's statements to them. This document has been checked and approved by the attending provider. ------------------------------------------------  Jodelle Gross, MD, PhD

## 2015-05-23 ENCOUNTER — Ambulatory Visit
Admission: RE | Admit: 2015-05-23 | Discharge: 2015-05-23 | Disposition: A | Discharge status: Home / Self Care | Payer: Commercial Managed Care - HMO | Source: Ambulatory Visit | Attending: Radiation Oncology | Admitting: Radiation Oncology

## 2015-05-23 DIAGNOSIS — I10 Essential (primary) hypertension: Secondary | ICD-10-CM | POA: Diagnosis not present

## 2015-05-23 DIAGNOSIS — R918 Other nonspecific abnormal finding of lung field: Secondary | ICD-10-CM | POA: Diagnosis not present

## 2015-05-23 DIAGNOSIS — Z952 Presence of prosthetic heart valve: Secondary | ICD-10-CM | POA: Diagnosis not present

## 2015-05-23 DIAGNOSIS — J449 Chronic obstructive pulmonary disease, unspecified: Secondary | ICD-10-CM | POA: Diagnosis not present

## 2015-05-23 DIAGNOSIS — C61 Malignant neoplasm of prostate: Secondary | ICD-10-CM | POA: Diagnosis not present

## 2015-05-23 DIAGNOSIS — I4891 Unspecified atrial fibrillation: Secondary | ICD-10-CM | POA: Diagnosis not present

## 2015-05-23 DIAGNOSIS — D509 Iron deficiency anemia, unspecified: Secondary | ICD-10-CM | POA: Diagnosis not present

## 2015-05-23 DIAGNOSIS — I679 Cerebrovascular disease, unspecified: Secondary | ICD-10-CM | POA: Diagnosis not present

## 2015-05-23 DIAGNOSIS — Z510 Encounter for antineoplastic radiation therapy: Secondary | ICD-10-CM | POA: Diagnosis not present

## 2015-05-24 ENCOUNTER — Ambulatory Visit
Admission: RE | Admit: 2015-05-24 | Discharge: 2015-05-24 | Disposition: A | Discharge status: Home / Self Care | Payer: Commercial Managed Care - HMO | Source: Ambulatory Visit | Attending: Radiation Oncology | Admitting: Radiation Oncology

## 2015-05-24 DIAGNOSIS — I4891 Unspecified atrial fibrillation: Secondary | ICD-10-CM | POA: Diagnosis not present

## 2015-05-24 DIAGNOSIS — R918 Other nonspecific abnormal finding of lung field: Secondary | ICD-10-CM | POA: Diagnosis not present

## 2015-05-24 DIAGNOSIS — Z952 Presence of prosthetic heart valve: Secondary | ICD-10-CM | POA: Diagnosis not present

## 2015-05-24 DIAGNOSIS — C61 Malignant neoplasm of prostate: Secondary | ICD-10-CM | POA: Diagnosis not present

## 2015-05-24 DIAGNOSIS — D509 Iron deficiency anemia, unspecified: Secondary | ICD-10-CM | POA: Diagnosis not present

## 2015-05-24 DIAGNOSIS — I679 Cerebrovascular disease, unspecified: Secondary | ICD-10-CM | POA: Diagnosis not present

## 2015-05-24 DIAGNOSIS — J449 Chronic obstructive pulmonary disease, unspecified: Secondary | ICD-10-CM | POA: Diagnosis not present

## 2015-05-24 DIAGNOSIS — Z510 Encounter for antineoplastic radiation therapy: Secondary | ICD-10-CM | POA: Diagnosis not present

## 2015-05-24 DIAGNOSIS — I10 Essential (primary) hypertension: Secondary | ICD-10-CM | POA: Diagnosis not present

## 2015-05-25 ENCOUNTER — Ambulatory Visit
Admission: RE | Admit: 2015-05-25 | Discharge: 2015-05-25 | Disposition: A | Discharge status: Home / Self Care | Payer: Commercial Managed Care - HMO | Source: Ambulatory Visit | Attending: Radiation Oncology | Admitting: Radiation Oncology

## 2015-05-25 ENCOUNTER — Other Ambulatory Visit (INDEPENDENT_AMBULATORY_CARE_PROVIDER_SITE_OTHER): Payer: Self-pay

## 2015-05-25 ENCOUNTER — Ambulatory Visit (INDEPENDENT_AMBULATORY_CARE_PROVIDER_SITE_OTHER): Payer: Commercial Managed Care - HMO | Admitting: *Deleted

## 2015-05-25 DIAGNOSIS — R918 Other nonspecific abnormal finding of lung field: Secondary | ICD-10-CM | POA: Diagnosis not present

## 2015-05-25 DIAGNOSIS — R569 Unspecified convulsions: Secondary | ICD-10-CM

## 2015-05-25 DIAGNOSIS — D509 Iron deficiency anemia, unspecified: Secondary | ICD-10-CM | POA: Diagnosis not present

## 2015-05-25 DIAGNOSIS — C61 Malignant neoplasm of prostate: Secondary | ICD-10-CM | POA: Diagnosis not present

## 2015-05-25 DIAGNOSIS — Z79899 Other long term (current) drug therapy: Secondary | ICD-10-CM | POA: Diagnosis not present

## 2015-05-25 DIAGNOSIS — Z952 Presence of prosthetic heart valve: Secondary | ICD-10-CM | POA: Diagnosis not present

## 2015-05-25 DIAGNOSIS — Z7901 Long term (current) use of anticoagulants: Secondary | ICD-10-CM

## 2015-05-25 DIAGNOSIS — Z5181 Encounter for therapeutic drug level monitoring: Secondary | ICD-10-CM | POA: Diagnosis not present

## 2015-05-25 DIAGNOSIS — J449 Chronic obstructive pulmonary disease, unspecified: Secondary | ICD-10-CM | POA: Diagnosis not present

## 2015-05-25 DIAGNOSIS — Z510 Encounter for antineoplastic radiation therapy: Secondary | ICD-10-CM | POA: Diagnosis not present

## 2015-05-25 DIAGNOSIS — I4891 Unspecified atrial fibrillation: Secondary | ICD-10-CM

## 2015-05-25 DIAGNOSIS — Z0289 Encounter for other administrative examinations: Secondary | ICD-10-CM

## 2015-05-25 DIAGNOSIS — I679 Cerebrovascular disease, unspecified: Secondary | ICD-10-CM | POA: Diagnosis not present

## 2015-05-25 DIAGNOSIS — I10 Essential (primary) hypertension: Secondary | ICD-10-CM | POA: Diagnosis not present

## 2015-05-25 LAB — POCT INR: INR: 4.1

## 2015-05-26 ENCOUNTER — Telehealth: Payer: Self-pay

## 2015-05-26 ENCOUNTER — Ambulatory Visit
Admission: RE | Admit: 2015-05-26 | Discharge: 2015-05-26 | Disposition: A | Discharge status: Home / Self Care | Payer: Commercial Managed Care - HMO | Source: Ambulatory Visit | Attending: Radiation Oncology | Admitting: Radiation Oncology

## 2015-05-26 DIAGNOSIS — I679 Cerebrovascular disease, unspecified: Secondary | ICD-10-CM | POA: Diagnosis not present

## 2015-05-26 DIAGNOSIS — Z952 Presence of prosthetic heart valve: Secondary | ICD-10-CM | POA: Diagnosis not present

## 2015-05-26 DIAGNOSIS — I4891 Unspecified atrial fibrillation: Secondary | ICD-10-CM | POA: Diagnosis not present

## 2015-05-26 DIAGNOSIS — R918 Other nonspecific abnormal finding of lung field: Secondary | ICD-10-CM | POA: Diagnosis not present

## 2015-05-26 DIAGNOSIS — J449 Chronic obstructive pulmonary disease, unspecified: Secondary | ICD-10-CM | POA: Diagnosis not present

## 2015-05-26 DIAGNOSIS — M545 Low back pain: Secondary | ICD-10-CM | POA: Diagnosis not present

## 2015-05-26 DIAGNOSIS — D509 Iron deficiency anemia, unspecified: Secondary | ICD-10-CM | POA: Diagnosis not present

## 2015-05-26 DIAGNOSIS — Z510 Encounter for antineoplastic radiation therapy: Secondary | ICD-10-CM | POA: Diagnosis not present

## 2015-05-26 DIAGNOSIS — M542 Cervicalgia: Secondary | ICD-10-CM | POA: Diagnosis not present

## 2015-05-26 DIAGNOSIS — C61 Malignant neoplasm of prostate: Secondary | ICD-10-CM | POA: Diagnosis not present

## 2015-05-26 DIAGNOSIS — I10 Essential (primary) hypertension: Secondary | ICD-10-CM | POA: Diagnosis not present

## 2015-05-26 LAB — CBC WITH DIFFERENTIAL/PLATELET
BASOS ABS: 0 10*3/uL (ref 0.0–0.2)
Basos: 1 %
EOS (ABSOLUTE): 0.2 10*3/uL (ref 0.0–0.4)
Eos: 4 %
HEMATOCRIT: 37.8 % (ref 37.5–51.0)
Hemoglobin: 12.5 g/dL — ABNORMAL LOW (ref 12.6–17.7)
Immature Grans (Abs): 0 10*3/uL (ref 0.0–0.1)
Immature Granulocytes: 0 %
LYMPHS ABS: 1 10*3/uL (ref 0.7–3.1)
Lymphs: 16 %
MCH: 31.7 pg (ref 26.6–33.0)
MCHC: 33.1 g/dL (ref 31.5–35.7)
MCV: 96 fL (ref 79–97)
MONOS ABS: 1 10*3/uL — AB (ref 0.1–0.9)
Monocytes: 16 %
Neutrophils Absolute: 3.9 10*3/uL (ref 1.4–7.0)
Neutrophils: 63 %
PLATELETS: 217 10*3/uL (ref 150–379)
RBC: 3.94 x10E6/uL — AB (ref 4.14–5.80)
RDW: 13.2 % (ref 12.3–15.4)
WBC: 6.1 10*3/uL (ref 3.4–10.8)

## 2015-05-26 LAB — COMPREHENSIVE METABOLIC PANEL
A/G RATIO: 1.8 (ref 1.1–2.5)
ALT: 13 IU/L (ref 0–44)
AST: 16 IU/L (ref 0–40)
Albumin: 4.5 g/dL (ref 3.6–4.8)
Alkaline Phosphatase: 118 IU/L — ABNORMAL HIGH (ref 39–117)
BUN/Creatinine Ratio: 6 — ABNORMAL LOW (ref 10–22)
BUN: 6 mg/dL — ABNORMAL LOW (ref 8–27)
Bilirubin Total: 0.5 mg/dL (ref 0.0–1.2)
CHLORIDE: 97 mmol/L (ref 97–106)
CO2: 22 mmol/L (ref 18–29)
Calcium: 9.2 mg/dL (ref 8.6–10.2)
Creatinine, Ser: 1 mg/dL (ref 0.76–1.27)
GFR calc Af Amer: 88 mL/min/{1.73_m2} (ref >59)
GFR calc non Af Amer: 76 mL/min/{1.73_m2} (ref >59)
GLOBULIN, TOTAL: 2.5 g/dL (ref 1.5–4.5)
Glucose: 96 mg/dL (ref 65–99)
POTASSIUM: 4.6 mmol/L (ref 3.5–5.2)
SODIUM: 139 mmol/L (ref 136–144)
Total Protein: 7 g/dL (ref 6.0–8.5)

## 2015-05-26 LAB — PHENYTOIN LEVEL, TOTAL: PHENYTOIN (DILANTIN), SERUM: 11.2 ug/mL (ref 10.0–20.0)

## 2015-05-26 NOTE — Telephone Encounter (Signed)
Called patient to give lab results. No answer. Left vmail on HP# per DPR.

## 2015-05-26 NOTE — Telephone Encounter (Signed)
-----   Message from Ward Givens, NP sent at 05/26/2015  4:54 PM EDT ----- Lab work is ok. Please call the patient.

## 2015-05-27 ENCOUNTER — Ambulatory Visit
Admission: RE | Admit: 2015-05-27 | Discharge: 2015-05-27 | Disposition: A | Discharge status: Home / Self Care | Payer: Commercial Managed Care - HMO | Source: Ambulatory Visit | Attending: Radiation Oncology | Admitting: Radiation Oncology

## 2015-05-27 ENCOUNTER — Encounter: Payer: Self-pay | Admitting: Radiation Oncology

## 2015-05-27 VITALS — BP 150/64 | HR 66 | Resp 16 | Wt 161.7 lb

## 2015-05-27 DIAGNOSIS — C61 Malignant neoplasm of prostate: Secondary | ICD-10-CM

## 2015-05-27 DIAGNOSIS — R918 Other nonspecific abnormal finding of lung field: Secondary | ICD-10-CM | POA: Diagnosis not present

## 2015-05-27 DIAGNOSIS — I679 Cerebrovascular disease, unspecified: Secondary | ICD-10-CM | POA: Diagnosis not present

## 2015-05-27 DIAGNOSIS — I10 Essential (primary) hypertension: Secondary | ICD-10-CM | POA: Diagnosis not present

## 2015-05-27 DIAGNOSIS — Z510 Encounter for antineoplastic radiation therapy: Secondary | ICD-10-CM | POA: Diagnosis not present

## 2015-05-27 DIAGNOSIS — J449 Chronic obstructive pulmonary disease, unspecified: Secondary | ICD-10-CM | POA: Diagnosis not present

## 2015-05-27 DIAGNOSIS — D509 Iron deficiency anemia, unspecified: Secondary | ICD-10-CM | POA: Diagnosis not present

## 2015-05-27 DIAGNOSIS — Z952 Presence of prosthetic heart valve: Secondary | ICD-10-CM | POA: Diagnosis not present

## 2015-05-27 DIAGNOSIS — I4891 Unspecified atrial fibrillation: Secondary | ICD-10-CM | POA: Diagnosis not present

## 2015-05-27 NOTE — Progress Notes (Signed)
Weight and vitals stable. Denies pain. Reports frequency and urgency associated with urination. Denies hematuria or dysuria. Denies diarrhea. Reports nocturia x 2. Reports fatigue.   BP 150/64 mmHg  Pulse 66  Resp 16  Wt 161 lb 11.2 oz (73.347 kg)  SpO2 100% Wt Readings from Last 3 Encounters:  05/27/15 161 lb 11.2 oz (73.347 kg)  05/20/15 163 lb (73.936 kg)  05/12/15 167 lb 3.2 oz (75.841 kg)

## 2015-05-27 NOTE — Progress Notes (Signed)
   Department of Radiation Oncology  Phone:  [phone] Fax:        [phone]  Weekly Treatment Note    Name: Jeffery Copeland Date: 05/27/2015 MRN: [id number] DOB: 08-12-1945   Current dose: 35.1 Gy  Current fraction: 18   MEDICATIONS: Current Outpatient Prescriptions  Medication Sig Dispense Refill  . Cholecalciferol (VITAMIN D3) 1000 UNITS CAPS Take 1 capsule by mouth daily.     Marland Kitchen ezetimibe (ZETIA) 10 MG tablet Take 1 tablet (10 mg total) by mouth daily. 30 tablet 6  . methocarbamol (ROBAXIN) 500 MG tablet Take 500 mg by mouth 2 (two) times daily.    . metoprolol tartrate (LOPRESSOR) 25 MG tablet TAKE 1/2 TABLET BY MOUTH TWICE A DAY 30 tablet 4  . oxyCODONE (OXY IR/ROXICODONE) 5 MG immediate release tablet Take 1 tablet by mouth every 6 (six) hours as needed.    . phenytoin (DILANTIN) 100 MG ER capsule TAKE 3 CAPSULES EVERY DAY 270 capsule 0  . ramipril (ALTACE) 5 MG capsule TAKE ONE CAPSULE BY MOUTH EVERY DAY 30 capsule 4  . tiotropium (SPIRIVA HANDIHALER) 18 MCG inhalation capsule PLACE 1 CAPSULE INTO INHALER & INHALE DAILY 90 capsule 4  . warfarin (COUMADIN) 5 MG tablet TAKE AS DIRECTED BY ANTICOAGULATION CLINIC 100 tablet 1  . zolpidem (AMBIEN) 5 MG tablet Take 5 mg by mouth at bedtime as needed. For insomnia.     No current facility-administered medications for this encounter.     ALLERGIES: Rosuvastatin   LABORATORY DATA:  Lab Results  Component Value Date   WBC 6.1 05/25/2015   HGB 13.4 08/05/2014   HCT 37.8 05/25/2015   MCV 94 08/05/2014   PLT 194 08/05/2014   Lab Results  Component Value Date   NA 139 05/25/2015   K 4.6 05/25/2015   CL 97 05/25/2015   CO2 22 05/25/2015   Lab Results  Component Value Date   ALT 13 05/25/2015   AST 16 05/25/2015   ALKPHOS 118* 05/25/2015   BILITOT 0.5 05/25/2015     NARRATIVE: Jeffery Copeland was seen today for weekly treatment management. The chart was checked and the patient's films were reviewed.  Weight  and vitals stable. Denies pain. Reports frequency and urgency associated with urination. Denies hematuria or dysuria. Denies diarrhea. Reports nocturia x 2. Reports fatigue.   BP 150/64 mmHg  Pulse 66  Resp 16  Wt 161 lb 11.2 oz (73.347 kg)  SpO2 100% Wt Readings from Last 3 Encounters:  05/27/15 161 lb 11.2 oz (73.347 kg)  05/20/15 163 lb (73.936 kg)  05/12/15 167 lb 3.2 oz (75.841 kg)     PHYSICAL EXAMINATION: weight is 161 lb 11.2 oz (73.347 kg). His blood pressure is 150/64 and his pulse is 66. His respiration is 16 and oxygen saturation is 100%.        ASSESSMENT: The patient is doing satisfactorily with treatment.  No significant new complaints today. He is pleased with how he is doing so far.  PLAN: We will continue with the patient's radiation treatment as planned.

## 2015-05-30 ENCOUNTER — Ambulatory Visit
Admission: RE | Admit: 2015-05-30 | Discharge: 2015-05-30 | Disposition: A | Discharge status: Home / Self Care | Payer: Commercial Managed Care - HMO | Source: Ambulatory Visit | Attending: Radiation Oncology | Admitting: Radiation Oncology

## 2015-05-30 DIAGNOSIS — R918 Other nonspecific abnormal finding of lung field: Secondary | ICD-10-CM | POA: Diagnosis not present

## 2015-05-30 DIAGNOSIS — I679 Cerebrovascular disease, unspecified: Secondary | ICD-10-CM | POA: Diagnosis not present

## 2015-05-30 DIAGNOSIS — D509 Iron deficiency anemia, unspecified: Secondary | ICD-10-CM | POA: Diagnosis not present

## 2015-05-30 DIAGNOSIS — J449 Chronic obstructive pulmonary disease, unspecified: Secondary | ICD-10-CM | POA: Diagnosis not present

## 2015-05-30 DIAGNOSIS — I4891 Unspecified atrial fibrillation: Secondary | ICD-10-CM | POA: Diagnosis not present

## 2015-05-30 DIAGNOSIS — Z952 Presence of prosthetic heart valve: Secondary | ICD-10-CM | POA: Diagnosis not present

## 2015-05-30 DIAGNOSIS — I10 Essential (primary) hypertension: Secondary | ICD-10-CM | POA: Diagnosis not present

## 2015-05-30 DIAGNOSIS — Z510 Encounter for antineoplastic radiation therapy: Secondary | ICD-10-CM | POA: Diagnosis not present

## 2015-05-30 DIAGNOSIS — C61 Malignant neoplasm of prostate: Secondary | ICD-10-CM | POA: Diagnosis not present

## 2015-05-31 ENCOUNTER — Ambulatory Visit
Admission: RE | Admit: 2015-05-31 | Discharge: 2015-05-31 | Disposition: A | Discharge status: Home / Self Care | Payer: Commercial Managed Care - HMO | Source: Ambulatory Visit | Attending: Radiation Oncology | Admitting: Radiation Oncology

## 2015-05-31 DIAGNOSIS — Z952 Presence of prosthetic heart valve: Secondary | ICD-10-CM | POA: Diagnosis not present

## 2015-05-31 DIAGNOSIS — R918 Other nonspecific abnormal finding of lung field: Secondary | ICD-10-CM | POA: Diagnosis not present

## 2015-05-31 DIAGNOSIS — C61 Malignant neoplasm of prostate: Secondary | ICD-10-CM | POA: Diagnosis not present

## 2015-05-31 DIAGNOSIS — I679 Cerebrovascular disease, unspecified: Secondary | ICD-10-CM | POA: Diagnosis not present

## 2015-05-31 DIAGNOSIS — I10 Essential (primary) hypertension: Secondary | ICD-10-CM | POA: Diagnosis not present

## 2015-05-31 DIAGNOSIS — D509 Iron deficiency anemia, unspecified: Secondary | ICD-10-CM | POA: Diagnosis not present

## 2015-05-31 DIAGNOSIS — J449 Chronic obstructive pulmonary disease, unspecified: Secondary | ICD-10-CM | POA: Diagnosis not present

## 2015-05-31 DIAGNOSIS — I4891 Unspecified atrial fibrillation: Secondary | ICD-10-CM | POA: Diagnosis not present

## 2015-05-31 DIAGNOSIS — Z510 Encounter for antineoplastic radiation therapy: Secondary | ICD-10-CM | POA: Diagnosis not present

## 2015-05-31 NOTE — Telephone Encounter (Signed)
This encounter was created in error - please disregard.

## 2015-06-01 ENCOUNTER — Ambulatory Visit
Admission: RE | Admit: 2015-06-01 | Discharge: 2015-06-01 | Disposition: A | Discharge status: Home / Self Care | Payer: Commercial Managed Care - HMO | Source: Ambulatory Visit | Attending: Radiation Oncology | Admitting: Radiation Oncology

## 2015-06-01 DIAGNOSIS — R918 Other nonspecific abnormal finding of lung field: Secondary | ICD-10-CM | POA: Diagnosis not present

## 2015-06-01 DIAGNOSIS — D509 Iron deficiency anemia, unspecified: Secondary | ICD-10-CM | POA: Diagnosis not present

## 2015-06-01 DIAGNOSIS — J449 Chronic obstructive pulmonary disease, unspecified: Secondary | ICD-10-CM | POA: Diagnosis not present

## 2015-06-01 DIAGNOSIS — I679 Cerebrovascular disease, unspecified: Secondary | ICD-10-CM | POA: Diagnosis not present

## 2015-06-01 DIAGNOSIS — I10 Essential (primary) hypertension: Secondary | ICD-10-CM | POA: Diagnosis not present

## 2015-06-01 DIAGNOSIS — I4891 Unspecified atrial fibrillation: Secondary | ICD-10-CM | POA: Diagnosis not present

## 2015-06-01 DIAGNOSIS — C61 Malignant neoplasm of prostate: Secondary | ICD-10-CM | POA: Diagnosis not present

## 2015-06-01 DIAGNOSIS — Z952 Presence of prosthetic heart valve: Secondary | ICD-10-CM | POA: Diagnosis not present

## 2015-06-01 DIAGNOSIS — Z510 Encounter for antineoplastic radiation therapy: Secondary | ICD-10-CM | POA: Diagnosis not present

## 2015-06-02 ENCOUNTER — Ambulatory Visit
Admission: RE | Admit: 2015-06-02 | Discharge: 2015-06-02 | Disposition: A | Discharge status: Home / Self Care | Payer: Commercial Managed Care - HMO | Source: Ambulatory Visit | Attending: Radiation Oncology | Admitting: Radiation Oncology

## 2015-06-02 DIAGNOSIS — R918 Other nonspecific abnormal finding of lung field: Secondary | ICD-10-CM | POA: Diagnosis not present

## 2015-06-02 DIAGNOSIS — C61 Malignant neoplasm of prostate: Secondary | ICD-10-CM | POA: Diagnosis not present

## 2015-06-02 DIAGNOSIS — J449 Chronic obstructive pulmonary disease, unspecified: Secondary | ICD-10-CM | POA: Diagnosis not present

## 2015-06-02 DIAGNOSIS — Z952 Presence of prosthetic heart valve: Secondary | ICD-10-CM | POA: Diagnosis not present

## 2015-06-02 DIAGNOSIS — I4891 Unspecified atrial fibrillation: Secondary | ICD-10-CM | POA: Diagnosis not present

## 2015-06-02 DIAGNOSIS — I10 Essential (primary) hypertension: Secondary | ICD-10-CM | POA: Diagnosis not present

## 2015-06-02 DIAGNOSIS — D509 Iron deficiency anemia, unspecified: Secondary | ICD-10-CM | POA: Diagnosis not present

## 2015-06-02 DIAGNOSIS — Z510 Encounter for antineoplastic radiation therapy: Secondary | ICD-10-CM | POA: Diagnosis not present

## 2015-06-02 DIAGNOSIS — I679 Cerebrovascular disease, unspecified: Secondary | ICD-10-CM | POA: Diagnosis not present

## 2015-06-03 ENCOUNTER — Ambulatory Visit
Admission: RE | Admit: 2015-06-03 | Discharge: 2015-06-03 | Disposition: A | Discharge status: Home / Self Care | Payer: Commercial Managed Care - HMO | Source: Ambulatory Visit | Attending: Radiation Oncology | Admitting: Radiation Oncology

## 2015-06-03 ENCOUNTER — Encounter: Payer: Self-pay | Admitting: Radiation Oncology

## 2015-06-03 VITALS — BP 157/78 | HR 70 | Resp 16 | Wt 162.9 lb

## 2015-06-03 DIAGNOSIS — I4891 Unspecified atrial fibrillation: Secondary | ICD-10-CM | POA: Diagnosis not present

## 2015-06-03 DIAGNOSIS — Z510 Encounter for antineoplastic radiation therapy: Secondary | ICD-10-CM | POA: Diagnosis not present

## 2015-06-03 DIAGNOSIS — I679 Cerebrovascular disease, unspecified: Secondary | ICD-10-CM | POA: Diagnosis not present

## 2015-06-03 DIAGNOSIS — D509 Iron deficiency anemia, unspecified: Secondary | ICD-10-CM | POA: Diagnosis not present

## 2015-06-03 DIAGNOSIS — J449 Chronic obstructive pulmonary disease, unspecified: Secondary | ICD-10-CM | POA: Diagnosis not present

## 2015-06-03 DIAGNOSIS — C61 Malignant neoplasm of prostate: Secondary | ICD-10-CM

## 2015-06-03 DIAGNOSIS — I10 Essential (primary) hypertension: Secondary | ICD-10-CM | POA: Diagnosis not present

## 2015-06-03 DIAGNOSIS — Z952 Presence of prosthetic heart valve: Secondary | ICD-10-CM | POA: Diagnosis not present

## 2015-06-03 DIAGNOSIS — R918 Other nonspecific abnormal finding of lung field: Secondary | ICD-10-CM | POA: Diagnosis not present

## 2015-06-03 NOTE — Progress Notes (Signed)
Weight stable. BP elevated. Reports chronic back pain continues. Reports nocturia x 3. Reports urinary frequency and urgency. Reports only one episode of dysuria. Denies hematuria. Denies diarrhea. Reports fatigue.   BP 157/78 mmHg  Pulse 70  Resp 16  Wt 162 lb 14.4 oz (73.891 kg) Wt Readings from Last 3 Encounters:  06/03/15 162 lb 14.4 oz (73.891 kg)  05/27/15 161 lb 11.2 oz (73.347 kg)  05/20/15 163 lb (73.936 kg)

## 2015-06-03 NOTE — Progress Notes (Signed)
  Radiation Oncology         [phone]   Name: Jeffery Copeland MRN: [id number]   Date: 06/03/2015  DOB: May 26, 1946     Weekly Radiation Therapy Management    ICD-9-CM ICD-10-CM   1. Malignant neoplasm of prostate (HCC) 185 C61     Current Dose: 44.85 Gy  Planned Dose:  78 Gy  Narrative The patient presents for routine under treatment assessment.  Weight stable. BP elevated. Reports chronic back pain continues. Reports nocturia x 3. Reports urinary frequency and urgency. Reports only one episode of dysuria. Denies hematuria. Denies diarrhea. Reports fatigue.  The patient is without complaint. Set-up films were reviewed. The chart was checked.  Physical Findings  weight is 162 lb 14.4 oz (73.891 kg). His blood pressure is 157/78 and his pulse is 70. His respiration is 16. . Weight essentially stable.  No significant changes.  Impression The patient is tolerating radiation.  Plan Continue treatment as planned.      Sheral Apley Tammi Klippel, M.D.  This document serves as a record of services personally performed by [name], MD. It was created on his behalf by Arlyce Harman, a trained medical scribe. The creation of this record is based on the scribe's personal observations and the provider's statements to them. This document has been checked and approved by the attending provider.

## 2015-06-06 ENCOUNTER — Ambulatory Visit
Admission: RE | Admit: 2015-06-06 | Discharge: 2015-06-06 | Disposition: A | Discharge status: Home / Self Care | Payer: Commercial Managed Care - HMO | Source: Ambulatory Visit | Attending: Radiation Oncology | Admitting: Radiation Oncology

## 2015-06-06 DIAGNOSIS — Z510 Encounter for antineoplastic radiation therapy: Secondary | ICD-10-CM | POA: Diagnosis not present

## 2015-06-06 DIAGNOSIS — R918 Other nonspecific abnormal finding of lung field: Secondary | ICD-10-CM | POA: Diagnosis not present

## 2015-06-06 DIAGNOSIS — C61 Malignant neoplasm of prostate: Secondary | ICD-10-CM | POA: Diagnosis not present

## 2015-06-06 DIAGNOSIS — D509 Iron deficiency anemia, unspecified: Secondary | ICD-10-CM | POA: Diagnosis not present

## 2015-06-06 DIAGNOSIS — J449 Chronic obstructive pulmonary disease, unspecified: Secondary | ICD-10-CM | POA: Diagnosis not present

## 2015-06-06 DIAGNOSIS — Z952 Presence of prosthetic heart valve: Secondary | ICD-10-CM | POA: Diagnosis not present

## 2015-06-06 DIAGNOSIS — I679 Cerebrovascular disease, unspecified: Secondary | ICD-10-CM | POA: Diagnosis not present

## 2015-06-06 DIAGNOSIS — I10 Essential (primary) hypertension: Secondary | ICD-10-CM | POA: Diagnosis not present

## 2015-06-06 DIAGNOSIS — I4891 Unspecified atrial fibrillation: Secondary | ICD-10-CM | POA: Diagnosis not present

## 2015-06-07 ENCOUNTER — Ambulatory Visit
Admission: RE | Admit: 2015-06-07 | Discharge: 2015-06-07 | Disposition: A | Discharge status: Home / Self Care | Payer: Commercial Managed Care - HMO | Source: Ambulatory Visit | Attending: Radiation Oncology | Admitting: Radiation Oncology

## 2015-06-07 DIAGNOSIS — D509 Iron deficiency anemia, unspecified: Secondary | ICD-10-CM | POA: Diagnosis not present

## 2015-06-07 DIAGNOSIS — I4891 Unspecified atrial fibrillation: Secondary | ICD-10-CM | POA: Diagnosis not present

## 2015-06-07 DIAGNOSIS — Z510 Encounter for antineoplastic radiation therapy: Secondary | ICD-10-CM | POA: Diagnosis not present

## 2015-06-07 DIAGNOSIS — R918 Other nonspecific abnormal finding of lung field: Secondary | ICD-10-CM | POA: Diagnosis not present

## 2015-06-07 DIAGNOSIS — I10 Essential (primary) hypertension: Secondary | ICD-10-CM | POA: Diagnosis not present

## 2015-06-07 DIAGNOSIS — I679 Cerebrovascular disease, unspecified: Secondary | ICD-10-CM | POA: Diagnosis not present

## 2015-06-07 DIAGNOSIS — J449 Chronic obstructive pulmonary disease, unspecified: Secondary | ICD-10-CM | POA: Diagnosis not present

## 2015-06-07 DIAGNOSIS — Z952 Presence of prosthetic heart valve: Secondary | ICD-10-CM | POA: Diagnosis not present

## 2015-06-07 DIAGNOSIS — C61 Malignant neoplasm of prostate: Secondary | ICD-10-CM | POA: Diagnosis not present

## 2015-06-08 ENCOUNTER — Ambulatory Visit (INDEPENDENT_AMBULATORY_CARE_PROVIDER_SITE_OTHER): Payer: Commercial Managed Care - HMO | Admitting: Surgery

## 2015-06-08 ENCOUNTER — Ambulatory Visit
Admission: RE | Admit: 2015-06-08 | Discharge: 2015-06-08 | Disposition: A | Discharge status: Home / Self Care | Payer: Commercial Managed Care - HMO | Source: Ambulatory Visit | Attending: Radiation Oncology | Admitting: Radiation Oncology

## 2015-06-08 DIAGNOSIS — R918 Other nonspecific abnormal finding of lung field: Secondary | ICD-10-CM | POA: Diagnosis not present

## 2015-06-08 DIAGNOSIS — Z952 Presence of prosthetic heart valve: Secondary | ICD-10-CM | POA: Diagnosis not present

## 2015-06-08 DIAGNOSIS — Z7901 Long term (current) use of anticoagulants: Secondary | ICD-10-CM

## 2015-06-08 DIAGNOSIS — Z510 Encounter for antineoplastic radiation therapy: Secondary | ICD-10-CM | POA: Diagnosis not present

## 2015-06-08 DIAGNOSIS — I10 Essential (primary) hypertension: Secondary | ICD-10-CM | POA: Diagnosis not present

## 2015-06-08 DIAGNOSIS — I679 Cerebrovascular disease, unspecified: Secondary | ICD-10-CM | POA: Diagnosis not present

## 2015-06-08 DIAGNOSIS — I4891 Unspecified atrial fibrillation: Secondary | ICD-10-CM | POA: Diagnosis not present

## 2015-06-08 DIAGNOSIS — C61 Malignant neoplasm of prostate: Secondary | ICD-10-CM | POA: Diagnosis not present

## 2015-06-08 DIAGNOSIS — Z5181 Encounter for therapeutic drug level monitoring: Secondary | ICD-10-CM

## 2015-06-08 DIAGNOSIS — J449 Chronic obstructive pulmonary disease, unspecified: Secondary | ICD-10-CM | POA: Diagnosis not present

## 2015-06-08 DIAGNOSIS — D509 Iron deficiency anemia, unspecified: Secondary | ICD-10-CM | POA: Diagnosis not present

## 2015-06-08 LAB — POCT INR: INR: 2.5

## 2015-06-09 ENCOUNTER — Ambulatory Visit
Admission: RE | Admit: 2015-06-09 | Discharge: 2015-06-09 | Disposition: A | Discharge status: Home / Self Care | Payer: Commercial Managed Care - HMO | Source: Ambulatory Visit | Attending: Radiation Oncology | Admitting: Radiation Oncology

## 2015-06-09 ENCOUNTER — Encounter: Payer: Self-pay | Admitting: Radiation Oncology

## 2015-06-09 VITALS — BP 164/72 | HR 62 | Resp 16 | Wt 163.5 lb

## 2015-06-09 DIAGNOSIS — Z510 Encounter for antineoplastic radiation therapy: Secondary | ICD-10-CM | POA: Diagnosis not present

## 2015-06-09 DIAGNOSIS — Z952 Presence of prosthetic heart valve: Secondary | ICD-10-CM | POA: Diagnosis not present

## 2015-06-09 DIAGNOSIS — C61 Malignant neoplasm of prostate: Secondary | ICD-10-CM

## 2015-06-09 DIAGNOSIS — R918 Other nonspecific abnormal finding of lung field: Secondary | ICD-10-CM | POA: Diagnosis not present

## 2015-06-09 DIAGNOSIS — I679 Cerebrovascular disease, unspecified: Secondary | ICD-10-CM | POA: Diagnosis not present

## 2015-06-09 DIAGNOSIS — J449 Chronic obstructive pulmonary disease, unspecified: Secondary | ICD-10-CM | POA: Diagnosis not present

## 2015-06-09 DIAGNOSIS — D509 Iron deficiency anemia, unspecified: Secondary | ICD-10-CM | POA: Diagnosis not present

## 2015-06-09 DIAGNOSIS — I10 Essential (primary) hypertension: Secondary | ICD-10-CM | POA: Diagnosis not present

## 2015-06-09 DIAGNOSIS — I4891 Unspecified atrial fibrillation: Secondary | ICD-10-CM | POA: Diagnosis not present

## 2015-06-09 NOTE — Progress Notes (Signed)
  Radiation Oncology         [phone]   Name: Jeffery Copeland MRN: [id number]   Date: 06/09/2015  DOB: 09-Jun-1946     Weekly Radiation Therapy Management    ICD-9-CM ICD-10-CM   1. Malignant neoplasm of prostate (HCC) 185 C61     Current Dose: 52.65 Gy  Planned Dose:  78 Gy  Narrative The patient presents for routine under treatment assessment.  Weight stable. BP elevated again this week. Reports chronic low back pain continues. Reports nocturia x 4. Reports urinary frequency and urgency continue. Reports dysuria yesterday morning. Denies hematuria. Reports intermittent diarrhea. Denies fatigue.  The patient is without complaint. Set-up films were reviewed. The chart was checked.  Physical Findings  weight is 163 lb 8 oz (74.163 kg). His blood pressure is 164/72 and his pulse is 62. His respiration is 16. . Weight essentially stable.  No significant changes.  Impression The patient is tolerating radiation.  Plan Continue treatment as planned.      Sheral Apley Tammi Klippel, M.D.  This document serves as a record of services personally performed by [name], MD. It was created on his behalf by Arlyce Harman, a trained medical scribe. The creation of this record is based on the scribe's personal observations and the provider's statements to them. This document has been checked and approved by the attending provider.

## 2015-06-09 NOTE — Progress Notes (Signed)
Weight stable. BP elevated again this week. Reports chronic low back pain continues. Reports nocturia x 4. Reports urinary frequency and urgency continue. Reports dysuria yesterday morning. Denies hematuria. Reports intermittent diarrhea. Denies fatigue.   BP 164/72 mmHg  Pulse 62  Resp 16  Wt 163 lb 8 oz (74.163 kg) Wt Readings from Last 3 Encounters:  06/09/15 163 lb 8 oz (74.163 kg)  06/03/15 162 lb 14.4 oz (73.891 kg)  05/27/15 161 lb 11.2 oz (73.347 kg)

## 2015-06-10 ENCOUNTER — Ambulatory Visit
Admission: RE | Admit: 2015-06-10 | Discharge: 2015-06-10 | Disposition: A | Discharge status: Home / Self Care | Payer: Commercial Managed Care - HMO | Source: Ambulatory Visit | Attending: Radiation Oncology | Admitting: Radiation Oncology

## 2015-06-10 DIAGNOSIS — D509 Iron deficiency anemia, unspecified: Secondary | ICD-10-CM | POA: Diagnosis not present

## 2015-06-10 DIAGNOSIS — I679 Cerebrovascular disease, unspecified: Secondary | ICD-10-CM | POA: Diagnosis not present

## 2015-06-10 DIAGNOSIS — R918 Other nonspecific abnormal finding of lung field: Secondary | ICD-10-CM | POA: Diagnosis not present

## 2015-06-10 DIAGNOSIS — C61 Malignant neoplasm of prostate: Secondary | ICD-10-CM | POA: Diagnosis not present

## 2015-06-10 DIAGNOSIS — Z510 Encounter for antineoplastic radiation therapy: Secondary | ICD-10-CM | POA: Diagnosis not present

## 2015-06-10 DIAGNOSIS — J449 Chronic obstructive pulmonary disease, unspecified: Secondary | ICD-10-CM | POA: Diagnosis not present

## 2015-06-10 DIAGNOSIS — I4891 Unspecified atrial fibrillation: Secondary | ICD-10-CM | POA: Diagnosis not present

## 2015-06-10 DIAGNOSIS — I10 Essential (primary) hypertension: Secondary | ICD-10-CM | POA: Diagnosis not present

## 2015-06-10 DIAGNOSIS — Z952 Presence of prosthetic heart valve: Secondary | ICD-10-CM | POA: Diagnosis not present

## 2015-06-12 DIAGNOSIS — I679 Cerebrovascular disease, unspecified: Secondary | ICD-10-CM | POA: Insufficient documentation

## 2015-06-12 DIAGNOSIS — J449 Chronic obstructive pulmonary disease, unspecified: Secondary | ICD-10-CM | POA: Insufficient documentation

## 2015-06-12 DIAGNOSIS — I4891 Unspecified atrial fibrillation: Secondary | ICD-10-CM | POA: Insufficient documentation

## 2015-06-12 DIAGNOSIS — Z510 Encounter for antineoplastic radiation therapy: Secondary | ICD-10-CM | POA: Insufficient documentation

## 2015-06-12 DIAGNOSIS — C61 Malignant neoplasm of prostate: Secondary | ICD-10-CM | POA: Insufficient documentation

## 2015-06-12 DIAGNOSIS — I10 Essential (primary) hypertension: Secondary | ICD-10-CM | POA: Insufficient documentation

## 2015-06-12 DIAGNOSIS — D509 Iron deficiency anemia, unspecified: Secondary | ICD-10-CM | POA: Insufficient documentation

## 2015-06-12 DIAGNOSIS — R918 Other nonspecific abnormal finding of lung field: Secondary | ICD-10-CM | POA: Insufficient documentation

## 2015-06-12 DIAGNOSIS — Z952 Presence of prosthetic heart valve: Secondary | ICD-10-CM | POA: Insufficient documentation

## 2015-06-13 ENCOUNTER — Ambulatory Visit
Admission: RE | Admit: 2015-06-13 | Discharge: 2015-06-13 | Disposition: A | Discharge status: Home / Self Care | Payer: Commercial Managed Care - HMO | Source: Ambulatory Visit | Attending: Radiation Oncology | Admitting: Radiation Oncology

## 2015-06-13 DIAGNOSIS — I10 Essential (primary) hypertension: Secondary | ICD-10-CM | POA: Diagnosis not present

## 2015-06-13 DIAGNOSIS — D509 Iron deficiency anemia, unspecified: Secondary | ICD-10-CM | POA: Diagnosis not present

## 2015-06-13 DIAGNOSIS — Z952 Presence of prosthetic heart valve: Secondary | ICD-10-CM | POA: Diagnosis not present

## 2015-06-13 DIAGNOSIS — C61 Malignant neoplasm of prostate: Secondary | ICD-10-CM | POA: Diagnosis not present

## 2015-06-13 DIAGNOSIS — J449 Chronic obstructive pulmonary disease, unspecified: Secondary | ICD-10-CM | POA: Diagnosis not present

## 2015-06-13 DIAGNOSIS — R918 Other nonspecific abnormal finding of lung field: Secondary | ICD-10-CM | POA: Diagnosis not present

## 2015-06-13 DIAGNOSIS — I4891 Unspecified atrial fibrillation: Secondary | ICD-10-CM | POA: Diagnosis not present

## 2015-06-13 DIAGNOSIS — I679 Cerebrovascular disease, unspecified: Secondary | ICD-10-CM | POA: Diagnosis not present

## 2015-06-13 DIAGNOSIS — Z510 Encounter for antineoplastic radiation therapy: Secondary | ICD-10-CM | POA: Diagnosis not present

## 2015-06-14 ENCOUNTER — Ambulatory Visit
Admission: RE | Admit: 2015-06-14 | Discharge: 2015-06-14 | Disposition: A | Discharge status: Home / Self Care | Payer: Commercial Managed Care - HMO | Source: Ambulatory Visit | Attending: Radiation Oncology | Admitting: Radiation Oncology

## 2015-06-14 DIAGNOSIS — D509 Iron deficiency anemia, unspecified: Secondary | ICD-10-CM | POA: Diagnosis not present

## 2015-06-14 DIAGNOSIS — J449 Chronic obstructive pulmonary disease, unspecified: Secondary | ICD-10-CM | POA: Diagnosis not present

## 2015-06-14 DIAGNOSIS — Z952 Presence of prosthetic heart valve: Secondary | ICD-10-CM | POA: Diagnosis not present

## 2015-06-14 DIAGNOSIS — I679 Cerebrovascular disease, unspecified: Secondary | ICD-10-CM | POA: Diagnosis not present

## 2015-06-14 DIAGNOSIS — I10 Essential (primary) hypertension: Secondary | ICD-10-CM | POA: Diagnosis not present

## 2015-06-14 DIAGNOSIS — I4891 Unspecified atrial fibrillation: Secondary | ICD-10-CM | POA: Diagnosis not present

## 2015-06-14 DIAGNOSIS — R918 Other nonspecific abnormal finding of lung field: Secondary | ICD-10-CM | POA: Diagnosis not present

## 2015-06-14 DIAGNOSIS — Z510 Encounter for antineoplastic radiation therapy: Secondary | ICD-10-CM | POA: Diagnosis not present

## 2015-06-14 DIAGNOSIS — C61 Malignant neoplasm of prostate: Secondary | ICD-10-CM | POA: Diagnosis not present

## 2015-06-15 ENCOUNTER — Ambulatory Visit
Admission: RE | Admit: 2015-06-15 | Discharge: 2015-06-15 | Disposition: A | Discharge status: Home / Self Care | Payer: Commercial Managed Care - HMO | Source: Ambulatory Visit | Attending: Radiation Oncology | Admitting: Radiation Oncology

## 2015-06-15 DIAGNOSIS — Z510 Encounter for antineoplastic radiation therapy: Secondary | ICD-10-CM | POA: Diagnosis not present

## 2015-06-15 DIAGNOSIS — I4891 Unspecified atrial fibrillation: Secondary | ICD-10-CM | POA: Diagnosis not present

## 2015-06-15 DIAGNOSIS — D509 Iron deficiency anemia, unspecified: Secondary | ICD-10-CM | POA: Diagnosis not present

## 2015-06-15 DIAGNOSIS — I10 Essential (primary) hypertension: Secondary | ICD-10-CM | POA: Diagnosis not present

## 2015-06-15 DIAGNOSIS — Z952 Presence of prosthetic heart valve: Secondary | ICD-10-CM | POA: Diagnosis not present

## 2015-06-15 DIAGNOSIS — I679 Cerebrovascular disease, unspecified: Secondary | ICD-10-CM | POA: Diagnosis not present

## 2015-06-15 DIAGNOSIS — J449 Chronic obstructive pulmonary disease, unspecified: Secondary | ICD-10-CM | POA: Diagnosis not present

## 2015-06-15 DIAGNOSIS — R918 Other nonspecific abnormal finding of lung field: Secondary | ICD-10-CM | POA: Diagnosis not present

## 2015-06-15 DIAGNOSIS — C61 Malignant neoplasm of prostate: Secondary | ICD-10-CM | POA: Diagnosis not present

## 2015-06-16 ENCOUNTER — Ambulatory Visit
Admission: RE | Admit: 2015-06-16 | Discharge: 2015-06-16 | Disposition: A | Discharge status: Home / Self Care | Payer: Commercial Managed Care - HMO | Source: Ambulatory Visit | Attending: Radiation Oncology | Admitting: Radiation Oncology

## 2015-06-16 DIAGNOSIS — Z510 Encounter for antineoplastic radiation therapy: Secondary | ICD-10-CM | POA: Diagnosis not present

## 2015-06-16 DIAGNOSIS — D509 Iron deficiency anemia, unspecified: Secondary | ICD-10-CM | POA: Diagnosis not present

## 2015-06-16 DIAGNOSIS — Z952 Presence of prosthetic heart valve: Secondary | ICD-10-CM | POA: Diagnosis not present

## 2015-06-16 DIAGNOSIS — I679 Cerebrovascular disease, unspecified: Secondary | ICD-10-CM | POA: Diagnosis not present

## 2015-06-16 DIAGNOSIS — I10 Essential (primary) hypertension: Secondary | ICD-10-CM | POA: Diagnosis not present

## 2015-06-16 DIAGNOSIS — J449 Chronic obstructive pulmonary disease, unspecified: Secondary | ICD-10-CM | POA: Diagnosis not present

## 2015-06-16 DIAGNOSIS — C61 Malignant neoplasm of prostate: Secondary | ICD-10-CM | POA: Diagnosis not present

## 2015-06-16 DIAGNOSIS — I4891 Unspecified atrial fibrillation: Secondary | ICD-10-CM | POA: Diagnosis not present

## 2015-06-16 DIAGNOSIS — R918 Other nonspecific abnormal finding of lung field: Secondary | ICD-10-CM | POA: Diagnosis not present

## 2015-06-17 ENCOUNTER — Ambulatory Visit
Admission: RE | Admit: 2015-06-17 | Discharge: 2015-06-17 | Disposition: A | Discharge status: Home / Self Care | Payer: Commercial Managed Care - HMO | Source: Ambulatory Visit | Attending: Radiation Oncology | Admitting: Radiation Oncology

## 2015-06-17 ENCOUNTER — Encounter: Payer: Self-pay | Admitting: Radiation Oncology

## 2015-06-17 VITALS — BP 142/84 | HR 70 | Temp 98.0°F | Ht 69.0 in | Wt 162.0 lb

## 2015-06-17 DIAGNOSIS — C61 Malignant neoplasm of prostate: Secondary | ICD-10-CM

## 2015-06-17 DIAGNOSIS — I10 Essential (primary) hypertension: Secondary | ICD-10-CM | POA: Diagnosis not present

## 2015-06-17 DIAGNOSIS — I4891 Unspecified atrial fibrillation: Secondary | ICD-10-CM | POA: Diagnosis not present

## 2015-06-17 DIAGNOSIS — Z510 Encounter for antineoplastic radiation therapy: Secondary | ICD-10-CM | POA: Diagnosis not present

## 2015-06-17 DIAGNOSIS — J449 Chronic obstructive pulmonary disease, unspecified: Secondary | ICD-10-CM | POA: Diagnosis not present

## 2015-06-17 DIAGNOSIS — R918 Other nonspecific abnormal finding of lung field: Secondary | ICD-10-CM | POA: Diagnosis not present

## 2015-06-17 DIAGNOSIS — Z952 Presence of prosthetic heart valve: Secondary | ICD-10-CM | POA: Diagnosis not present

## 2015-06-17 DIAGNOSIS — I679 Cerebrovascular disease, unspecified: Secondary | ICD-10-CM | POA: Diagnosis not present

## 2015-06-17 DIAGNOSIS — D509 Iron deficiency anemia, unspecified: Secondary | ICD-10-CM | POA: Diagnosis not present

## 2015-06-17 NOTE — Progress Notes (Signed)
  Radiation Oncology         [phone]   Name: Jeffery Copeland MRN: [id number]   Date: 06/17/2015  DOB: 1945-12-31     Weekly Radiation Therapy Management    ICD-9-CM ICD-10-CM   1. Malignant neoplasm of prostate (HCC) 185 C61     Current Dose: 64.35 Gy  Planned Dose:  78 Gy  Narrative The patient presents for routine under treatment assessment.  Mr. Hawken has received 33 fractions to hsi pelvis for prostate cancer. He reports that he empyties completely when voiding with occasional stinging. He denies any dribbling. Nocturia 1-4. Notes normal bowel pattern without any proctitis.  The patient is without complaint. Set-up films were reviewed. The chart was checked.  Physical Findings  height is 5\' 9"  (1.753 m) and weight is 162 lb (73.483 kg). His temperature is 98 F (36.7 C). His blood pressure is 142/84 and his pulse is 70. . Weight essentially stable.  No significant changes.  Impression The patient is tolerating radiation.  Plan Continue treatment as planned. The patient will finish treatment the week after Thanksgiving and will follow up in one month.       Sheral Apley Tammi Klippel, M.D.  This document serves as a record of services personally performed by [name], MD. It was created on his behalf by Arlyce Harman, a trained medical scribe. The creation of this record is based on the scribe's personal observations and the provider's statements to them. This document has been checked and approved by the attending provider.

## 2015-06-17 NOTE — Progress Notes (Signed)
Jeffery Copeland has received 33 fractions to hsi pelvis for prostate cancer.  He reports that he empyties completely when voiding with occasional stinging.  He denies any dribbling. Nocturia 1-4.  Notes normal bowel pattern without any proctitis.

## 2015-06-19 ENCOUNTER — Ambulatory Visit
Admission: RE | Admit: 2015-06-19 | Discharge: 2015-06-19 | Disposition: A | Discharge status: Home / Self Care | Payer: Commercial Managed Care - HMO | Source: Ambulatory Visit | Attending: Radiation Oncology | Admitting: Radiation Oncology

## 2015-06-19 DIAGNOSIS — I10 Essential (primary) hypertension: Secondary | ICD-10-CM | POA: Diagnosis not present

## 2015-06-19 DIAGNOSIS — Z952 Presence of prosthetic heart valve: Secondary | ICD-10-CM | POA: Diagnosis not present

## 2015-06-19 DIAGNOSIS — Z510 Encounter for antineoplastic radiation therapy: Secondary | ICD-10-CM | POA: Diagnosis not present

## 2015-06-19 DIAGNOSIS — J449 Chronic obstructive pulmonary disease, unspecified: Secondary | ICD-10-CM | POA: Diagnosis not present

## 2015-06-19 DIAGNOSIS — R918 Other nonspecific abnormal finding of lung field: Secondary | ICD-10-CM | POA: Diagnosis not present

## 2015-06-19 DIAGNOSIS — C61 Malignant neoplasm of prostate: Secondary | ICD-10-CM | POA: Diagnosis not present

## 2015-06-19 DIAGNOSIS — D509 Iron deficiency anemia, unspecified: Secondary | ICD-10-CM | POA: Diagnosis not present

## 2015-06-19 DIAGNOSIS — I679 Cerebrovascular disease, unspecified: Secondary | ICD-10-CM | POA: Diagnosis not present

## 2015-06-19 DIAGNOSIS — I4891 Unspecified atrial fibrillation: Secondary | ICD-10-CM | POA: Diagnosis not present

## 2015-06-20 ENCOUNTER — Ambulatory Visit
Admission: RE | Admit: 2015-06-20 | Discharge: 2015-06-20 | Disposition: A | Discharge status: Home / Self Care | Payer: Commercial Managed Care - HMO | Source: Ambulatory Visit | Attending: Radiation Oncology | Admitting: Radiation Oncology

## 2015-06-20 DIAGNOSIS — R918 Other nonspecific abnormal finding of lung field: Secondary | ICD-10-CM | POA: Diagnosis not present

## 2015-06-20 DIAGNOSIS — J449 Chronic obstructive pulmonary disease, unspecified: Secondary | ICD-10-CM | POA: Diagnosis not present

## 2015-06-20 DIAGNOSIS — Z952 Presence of prosthetic heart valve: Secondary | ICD-10-CM | POA: Diagnosis not present

## 2015-06-20 DIAGNOSIS — I679 Cerebrovascular disease, unspecified: Secondary | ICD-10-CM | POA: Diagnosis not present

## 2015-06-20 DIAGNOSIS — I4891 Unspecified atrial fibrillation: Secondary | ICD-10-CM | POA: Diagnosis not present

## 2015-06-20 DIAGNOSIS — Z510 Encounter for antineoplastic radiation therapy: Secondary | ICD-10-CM | POA: Diagnosis not present

## 2015-06-20 DIAGNOSIS — I10 Essential (primary) hypertension: Secondary | ICD-10-CM | POA: Diagnosis not present

## 2015-06-20 DIAGNOSIS — D509 Iron deficiency anemia, unspecified: Secondary | ICD-10-CM | POA: Diagnosis not present

## 2015-06-20 DIAGNOSIS — C61 Malignant neoplasm of prostate: Secondary | ICD-10-CM | POA: Diagnosis not present

## 2015-06-21 ENCOUNTER — Ambulatory Visit
Admission: RE | Admit: 2015-06-21 | Discharge: 2015-06-21 | Disposition: A | Discharge status: Home / Self Care | Payer: Commercial Managed Care - HMO | Source: Ambulatory Visit | Attending: Radiation Oncology | Admitting: Radiation Oncology

## 2015-06-21 DIAGNOSIS — I4891 Unspecified atrial fibrillation: Secondary | ICD-10-CM | POA: Diagnosis not present

## 2015-06-21 DIAGNOSIS — I10 Essential (primary) hypertension: Secondary | ICD-10-CM | POA: Diagnosis not present

## 2015-06-21 DIAGNOSIS — Z510 Encounter for antineoplastic radiation therapy: Secondary | ICD-10-CM | POA: Diagnosis not present

## 2015-06-21 DIAGNOSIS — Z952 Presence of prosthetic heart valve: Secondary | ICD-10-CM | POA: Diagnosis not present

## 2015-06-21 DIAGNOSIS — I679 Cerebrovascular disease, unspecified: Secondary | ICD-10-CM | POA: Diagnosis not present

## 2015-06-21 DIAGNOSIS — J449 Chronic obstructive pulmonary disease, unspecified: Secondary | ICD-10-CM | POA: Diagnosis not present

## 2015-06-21 DIAGNOSIS — D509 Iron deficiency anemia, unspecified: Secondary | ICD-10-CM | POA: Diagnosis not present

## 2015-06-21 DIAGNOSIS — C61 Malignant neoplasm of prostate: Secondary | ICD-10-CM | POA: Diagnosis not present

## 2015-06-21 DIAGNOSIS — R918 Other nonspecific abnormal finding of lung field: Secondary | ICD-10-CM | POA: Diagnosis not present

## 2015-06-21 NOTE — Progress Notes (Signed)
BP elevated. Weight stable. Patient refused to stay for an evaluation with the physician today.  He explains his ride is here and he is afraid he will get left.

## 2015-06-22 ENCOUNTER — Ambulatory Visit
Admission: RE | Admit: 2015-06-22 | Discharge: 2015-06-22 | Disposition: A | Discharge status: Home / Self Care | Payer: Commercial Managed Care - HMO | Source: Ambulatory Visit | Attending: Radiation Oncology | Admitting: Radiation Oncology

## 2015-06-22 ENCOUNTER — Encounter: Payer: Self-pay | Admitting: Radiation Oncology

## 2015-06-22 VITALS — BP 159/74 | HR 56 | Temp 97.6°F | Resp 16 | Ht 69.0 in | Wt 161.1 lb

## 2015-06-22 DIAGNOSIS — Z952 Presence of prosthetic heart valve: Secondary | ICD-10-CM | POA: Diagnosis not present

## 2015-06-22 DIAGNOSIS — R918 Other nonspecific abnormal finding of lung field: Secondary | ICD-10-CM | POA: Diagnosis not present

## 2015-06-22 DIAGNOSIS — C61 Malignant neoplasm of prostate: Secondary | ICD-10-CM

## 2015-06-22 DIAGNOSIS — I10 Essential (primary) hypertension: Secondary | ICD-10-CM | POA: Diagnosis not present

## 2015-06-22 DIAGNOSIS — I4891 Unspecified atrial fibrillation: Secondary | ICD-10-CM | POA: Diagnosis not present

## 2015-06-22 DIAGNOSIS — Z510 Encounter for antineoplastic radiation therapy: Secondary | ICD-10-CM | POA: Diagnosis not present

## 2015-06-22 DIAGNOSIS — J449 Chronic obstructive pulmonary disease, unspecified: Secondary | ICD-10-CM | POA: Diagnosis not present

## 2015-06-22 DIAGNOSIS — I679 Cerebrovascular disease, unspecified: Secondary | ICD-10-CM | POA: Diagnosis not present

## 2015-06-22 DIAGNOSIS — D509 Iron deficiency anemia, unspecified: Secondary | ICD-10-CM | POA: Diagnosis not present

## 2015-06-22 NOTE — Progress Notes (Signed)
Patient given one month follow up card.

## 2015-06-22 NOTE — Progress Notes (Signed)
   Department of Radiation Oncology  Phone:  [phone] Fax:        [phone]  Weekly Treatment Note    Name: [name] Date: 06/22/2015 MRN: [id number] DOB: 05-03-1946   Current dose: 72.15 Gy  Current fraction: 37   MEDICATIONS: Current Outpatient Prescriptions  Medication Sig Dispense Refill  . Cholecalciferol (VITAMIN D3) 1000 UNITS CAPS Take 1 capsule by mouth daily.     Marland Kitchen ezetimibe (ZETIA) 10 MG tablet Take 1 tablet (10 mg total) by mouth daily. 30 tablet 6  . methocarbamol (ROBAXIN) 500 MG tablet Take 500 mg by mouth 2 (two) times daily.    . metoprolol tartrate (LOPRESSOR) 25 MG tablet TAKE 1/2 TABLET BY MOUTH TWICE A DAY 30 tablet 4  . oxyCODONE (OXY IR/ROXICODONE) 5 MG immediate release tablet Take 1 tablet by mouth every 6 (six) hours as needed.    . phenytoin (DILANTIN) 100 MG ER capsule TAKE 3 CAPSULES EVERY DAY 270 capsule 0  . ramipril (ALTACE) 5 MG capsule TAKE ONE CAPSULE BY MOUTH EVERY DAY 30 capsule 4  . tiotropium (SPIRIVA HANDIHALER) 18 MCG inhalation capsule PLACE 1 CAPSULE INTO INHALER & INHALE DAILY 90 capsule 4  . warfarin (COUMADIN) 5 MG tablet TAKE AS DIRECTED BY ANTICOAGULATION CLINIC 100 tablet 1  . zolpidem (AMBIEN) 5 MG tablet Take 5 mg by mouth at bedtime as needed. For insomnia.     No current facility-administered medications for this encounter.     ALLERGIES: Rosuvastatin   LABORATORY DATA:  Lab Results  Component Value Date   WBC 6.1 05/25/2015   HGB 13.4 08/05/2014   HCT 37.8 05/25/2015   MCV 94 08/05/2014   PLT 194 08/05/2014   Lab Results  Component Value Date   NA 139 05/25/2015   K 4.6 05/25/2015   CL 97 05/25/2015   CO2 22 05/25/2015   Lab Results  Component Value Date   ALT 13 05/25/2015   AST 16 05/25/2015   ALKPHOS 118* 05/25/2015   BILITOT 0.5 05/25/2015     NARRATIVE: Jeffery Copeland was seen today for weekly treatment management. The chart was checked and the patient's films were reviewed.  The  patient clinically is stable. He denies any change in nocturia.  PHYSICAL EXAMINATION: height is 5\' 9"  (1.753 m) and weight is 161 lb 1.6 oz (73.074 kg). His oral temperature is 97.6 F (36.4 C). His blood pressure is 159/74 and his pulse is 56. His respiration is 16.        ASSESSMENT: The patient is doing satisfactorily with treatment. No new complaints today.   PLAN: We will continue with the patient's radiation treatment as planned. The patient will follow-up with Dr. Tammi Klippel in one month.

## 2015-06-24 ENCOUNTER — Ambulatory Visit: Payer: Commercial Managed Care - HMO

## 2015-06-27 ENCOUNTER — Ambulatory Visit
Admission: RE | Admit: 2015-06-27 | Discharge: 2015-06-27 | Disposition: A | Discharge status: Home / Self Care | Payer: Commercial Managed Care - HMO | Source: Ambulatory Visit | Attending: Radiation Oncology | Admitting: Radiation Oncology

## 2015-06-27 DIAGNOSIS — I10 Essential (primary) hypertension: Secondary | ICD-10-CM | POA: Diagnosis not present

## 2015-06-27 DIAGNOSIS — C61 Malignant neoplasm of prostate: Secondary | ICD-10-CM | POA: Diagnosis not present

## 2015-06-27 DIAGNOSIS — J449 Chronic obstructive pulmonary disease, unspecified: Secondary | ICD-10-CM | POA: Diagnosis not present

## 2015-06-27 DIAGNOSIS — Z79899 Other long term (current) drug therapy: Secondary | ICD-10-CM | POA: Diagnosis not present

## 2015-06-27 DIAGNOSIS — D509 Iron deficiency anemia, unspecified: Secondary | ICD-10-CM | POA: Diagnosis not present

## 2015-06-27 DIAGNOSIS — I4891 Unspecified atrial fibrillation: Secondary | ICD-10-CM | POA: Diagnosis not present

## 2015-06-27 DIAGNOSIS — Z952 Presence of prosthetic heart valve: Secondary | ICD-10-CM | POA: Diagnosis not present

## 2015-06-27 DIAGNOSIS — I679 Cerebrovascular disease, unspecified: Secondary | ICD-10-CM | POA: Diagnosis not present

## 2015-06-27 DIAGNOSIS — E784 Other hyperlipidemia: Secondary | ICD-10-CM | POA: Diagnosis not present

## 2015-06-27 DIAGNOSIS — H269 Unspecified cataract: Secondary | ICD-10-CM | POA: Diagnosis not present

## 2015-06-27 DIAGNOSIS — R918 Other nonspecific abnormal finding of lung field: Secondary | ICD-10-CM | POA: Diagnosis not present

## 2015-06-27 DIAGNOSIS — M545 Low back pain: Secondary | ICD-10-CM | POA: Diagnosis not present

## 2015-06-27 DIAGNOSIS — Z510 Encounter for antineoplastic radiation therapy: Secondary | ICD-10-CM | POA: Diagnosis not present

## 2015-06-28 ENCOUNTER — Ambulatory Visit
Admission: RE | Admit: 2015-06-28 | Discharge: 2015-06-28 | Disposition: A | Discharge status: Home / Self Care | Payer: Commercial Managed Care - HMO | Source: Ambulatory Visit | Attending: Radiation Oncology | Admitting: Radiation Oncology

## 2015-06-28 DIAGNOSIS — I10 Essential (primary) hypertension: Secondary | ICD-10-CM | POA: Diagnosis not present

## 2015-06-28 DIAGNOSIS — Z952 Presence of prosthetic heart valve: Secondary | ICD-10-CM | POA: Diagnosis not present

## 2015-06-28 DIAGNOSIS — I679 Cerebrovascular disease, unspecified: Secondary | ICD-10-CM | POA: Diagnosis not present

## 2015-06-28 DIAGNOSIS — Z510 Encounter for antineoplastic radiation therapy: Secondary | ICD-10-CM | POA: Diagnosis not present

## 2015-06-28 DIAGNOSIS — J449 Chronic obstructive pulmonary disease, unspecified: Secondary | ICD-10-CM | POA: Diagnosis not present

## 2015-06-28 DIAGNOSIS — R918 Other nonspecific abnormal finding of lung field: Secondary | ICD-10-CM | POA: Diagnosis not present

## 2015-06-28 DIAGNOSIS — D509 Iron deficiency anemia, unspecified: Secondary | ICD-10-CM | POA: Diagnosis not present

## 2015-06-28 DIAGNOSIS — C61 Malignant neoplasm of prostate: Secondary | ICD-10-CM | POA: Diagnosis not present

## 2015-06-28 DIAGNOSIS — I4891 Unspecified atrial fibrillation: Secondary | ICD-10-CM | POA: Diagnosis not present

## 2015-06-29 ENCOUNTER — Ambulatory Visit (INDEPENDENT_AMBULATORY_CARE_PROVIDER_SITE_OTHER): Payer: Commercial Managed Care - HMO | Admitting: *Deleted

## 2015-06-29 ENCOUNTER — Ambulatory Visit
Admission: RE | Admit: 2015-06-29 | Discharge: 2015-06-29 | Disposition: A | Discharge status: Home / Self Care | Payer: Commercial Managed Care - HMO | Source: Ambulatory Visit | Attending: Radiation Oncology | Admitting: Radiation Oncology

## 2015-06-29 ENCOUNTER — Encounter: Payer: Self-pay | Admitting: Radiation Oncology

## 2015-06-29 ENCOUNTER — Ambulatory Visit: Payer: Commercial Managed Care - HMO

## 2015-06-29 DIAGNOSIS — I10 Essential (primary) hypertension: Secondary | ICD-10-CM | POA: Diagnosis not present

## 2015-06-29 DIAGNOSIS — R918 Other nonspecific abnormal finding of lung field: Secondary | ICD-10-CM | POA: Diagnosis not present

## 2015-06-29 DIAGNOSIS — Z5181 Encounter for therapeutic drug level monitoring: Secondary | ICD-10-CM | POA: Diagnosis not present

## 2015-06-29 DIAGNOSIS — I4891 Unspecified atrial fibrillation: Secondary | ICD-10-CM | POA: Diagnosis not present

## 2015-06-29 DIAGNOSIS — Z7901 Long term (current) use of anticoagulants: Secondary | ICD-10-CM | POA: Diagnosis not present

## 2015-06-29 DIAGNOSIS — J449 Chronic obstructive pulmonary disease, unspecified: Secondary | ICD-10-CM | POA: Diagnosis not present

## 2015-06-29 DIAGNOSIS — Z952 Presence of prosthetic heart valve: Secondary | ICD-10-CM | POA: Diagnosis not present

## 2015-06-29 DIAGNOSIS — C61 Malignant neoplasm of prostate: Secondary | ICD-10-CM | POA: Diagnosis not present

## 2015-06-29 DIAGNOSIS — Z510 Encounter for antineoplastic radiation therapy: Secondary | ICD-10-CM | POA: Diagnosis not present

## 2015-06-29 DIAGNOSIS — D509 Iron deficiency anemia, unspecified: Secondary | ICD-10-CM | POA: Diagnosis not present

## 2015-06-29 DIAGNOSIS — I679 Cerebrovascular disease, unspecified: Secondary | ICD-10-CM | POA: Diagnosis not present

## 2015-06-29 LAB — POCT INR: INR: 3

## 2015-06-30 ENCOUNTER — Ambulatory Visit: Payer: Commercial Managed Care - HMO

## 2015-07-11 DIAGNOSIS — H2513 Age-related nuclear cataract, bilateral: Secondary | ICD-10-CM | POA: Diagnosis not present

## 2015-07-20 NOTE — Progress Notes (Signed)
  Radiation Oncology         [phone]) [phone] ________________________________  Name: Jeffery Copeland MRN: [id number]  Date: 06/29/2015  DOB: 04-27-1946  End of Treatment Note  Diagnosis:   69 y.o. 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  Image guidance was performed with daily cone beam CT prior to each fraction to align to gold markers in the prostate and assure proper bladder and rectal fill volumes.  Immobilization was achieved with Body[employer] custom mold.  Narrative: The patient tolerated radiation treatment relatively well.   The patient experienced some minor urinary irritation and modest fatigue.  He noted voiding with occasional stinging. He experienced nocturia 1-4. Noted normal bowel pattern without any proctitis  Plan: The patient has completed radiation treatment. He will return to radiation oncology clinic for routine followup in one month. I advised him to call or return sooner if he has any questions or concerns related to his recovery or treatment. ________________________________  Sheral Apley. Tammi Klippel, M.D.

## 2015-07-27 ENCOUNTER — Ambulatory Visit (INDEPENDENT_AMBULATORY_CARE_PROVIDER_SITE_OTHER): Payer: Commercial Managed Care - HMO | Admitting: *Deleted

## 2015-07-27 DIAGNOSIS — Z7901 Long term (current) use of anticoagulants: Secondary | ICD-10-CM | POA: Diagnosis not present

## 2015-07-27 DIAGNOSIS — Z5181 Encounter for therapeutic drug level monitoring: Secondary | ICD-10-CM

## 2015-07-27 DIAGNOSIS — I4891 Unspecified atrial fibrillation: Secondary | ICD-10-CM | POA: Diagnosis not present

## 2015-07-27 LAB — POCT INR: INR: 3.9

## 2015-07-28 ENCOUNTER — Ambulatory Visit
Admission: RE | Admit: 2015-07-28 | Discharge: 2015-07-28 | Disposition: A | Discharge status: Home / Self Care | Payer: Commercial Managed Care - HMO | Source: Ambulatory Visit | Attending: Radiation Oncology | Admitting: Radiation Oncology

## 2015-07-28 ENCOUNTER — Telehealth: Payer: Self-pay | Admitting: Radiation Oncology

## 2015-07-28 DIAGNOSIS — M545 Low back pain: Secondary | ICD-10-CM | POA: Diagnosis not present

## 2015-07-28 DIAGNOSIS — C61 Malignant neoplasm of prostate: Secondary | ICD-10-CM | POA: Diagnosis not present

## 2015-07-28 NOTE — Telephone Encounter (Signed)
Patient did not show for one month follow up. Phoned his home to inquire. Patient reports he forgot. Patient's only complaint is a back ache. Transferred patient to Jeffery Copeland to reschedule.

## 2015-08-11 DIAGNOSIS — H2511 Age-related nuclear cataract, right eye: Secondary | ICD-10-CM | POA: Diagnosis not present

## 2015-08-12 ENCOUNTER — Ambulatory Visit (INDEPENDENT_AMBULATORY_CARE_PROVIDER_SITE_OTHER): Payer: Commercial Managed Care - HMO | Admitting: *Deleted

## 2015-08-12 ENCOUNTER — Encounter: Payer: Self-pay | Admitting: Cardiology

## 2015-08-12 ENCOUNTER — Ambulatory Visit (INDEPENDENT_AMBULATORY_CARE_PROVIDER_SITE_OTHER): Payer: Commercial Managed Care - HMO | Admitting: Cardiology

## 2015-08-12 VITALS — BP 130/84 | HR 62 | Ht 69.0 in | Wt 158.0 lb

## 2015-08-12 DIAGNOSIS — Z5181 Encounter for therapeutic drug level monitoring: Secondary | ICD-10-CM

## 2015-08-12 DIAGNOSIS — Z954 Presence of other heart-valve replacement: Secondary | ICD-10-CM | POA: Diagnosis not present

## 2015-08-12 DIAGNOSIS — I509 Heart failure, unspecified: Secondary | ICD-10-CM | POA: Diagnosis not present

## 2015-08-12 DIAGNOSIS — I059 Rheumatic mitral valve disease, unspecified: Secondary | ICD-10-CM | POA: Diagnosis not present

## 2015-08-12 DIAGNOSIS — J438 Other emphysema: Secondary | ICD-10-CM

## 2015-08-12 DIAGNOSIS — I4891 Unspecified atrial fibrillation: Secondary | ICD-10-CM

## 2015-08-12 DIAGNOSIS — Z7901 Long term (current) use of anticoagulants: Secondary | ICD-10-CM

## 2015-08-12 DIAGNOSIS — I5032 Chronic diastolic (congestive) heart failure: Secondary | ICD-10-CM | POA: Diagnosis not present

## 2015-08-12 DIAGNOSIS — I348 Other nonrheumatic mitral valve disorders: Secondary | ICD-10-CM | POA: Diagnosis not present

## 2015-08-12 DIAGNOSIS — Z952 Presence of prosthetic heart valve: Secondary | ICD-10-CM

## 2015-08-12 LAB — POCT INR: INR: 2.8

## 2015-08-12 LAB — LIPID PANEL
Cholesterol: 133 mg/dL (ref 125–200)
HDL: 51 mg/dL (ref >=40)
LDL CALC: 58 mg/dL (ref <130)
TRIGLYCERIDES: 122 mg/dL (ref <150)
Total CHOL/HDL Ratio: 2.6 Ratio (ref <=5.0)
VLDL: 24 mg/dL (ref <30)

## 2015-08-12 LAB — BASIC METABOLIC PANEL
BUN: 8 mg/dL (ref 7–25)
CHLORIDE: 101 mmol/L (ref 98–110)
CO2: 29 mmol/L (ref 20–31)
Calcium: 9 mg/dL (ref 8.6–10.3)
Creat: 1.07 mg/dL (ref 0.70–1.25)
Glucose, Bld: 70 mg/dL (ref 65–99)
POTASSIUM: 4.5 mmol/L (ref 3.5–5.3)
Sodium: 138 mmol/L (ref 135–146)

## 2015-08-12 NOTE — Patient Instructions (Signed)
Medication Instructions:  No changes today  Labwork: Lipid profile/CBCd/BMET today.  Testing/Procedures: None.  Follow-Up: Your physician wants you to follow-up in: 6 months with Dr [name]. (July 2017). You will receive a reminder letter in the mail two months in advance. If you don't receive a letter, please call our office to schedule the follow-up appointment.   .     If you need a refill on your cardiac medications before your next appointment, please call your pharmacy.

## 2015-08-13 LAB — CBC WITH DIFFERENTIAL/PLATELET
BASOS ABS: 0.1 10*3/uL (ref 0.0–0.1)
Basophils Relative: 1 % (ref 0–1)
EOS ABS: 0.2 10*3/uL (ref 0.0–0.7)
EOS PCT: 3 % (ref 0–5)
HEMATOCRIT: 37.6 % — AB (ref 39.0–52.0)
Hemoglobin: 12.6 g/dL — ABNORMAL LOW (ref 13.0–17.0)
LYMPHS PCT: 16 % (ref 12–46)
Lymphs Abs: 0.9 10*3/uL (ref 0.7–4.0)
MCH: 32 pg (ref 26.0–34.0)
MCHC: 33.5 g/dL (ref 30.0–36.0)
MCV: 95.4 fL (ref 78.0–100.0)
MPV: 10.3 fL (ref 8.6–12.4)
Monocytes Absolute: 1 10*3/uL (ref 0.1–1.0)
Monocytes Relative: 17 % — ABNORMAL HIGH (ref 3–12)
NEUTROS PCT: 63 % (ref 43–77)
Neutro Abs: 3.7 10*3/uL (ref 1.7–7.7)
PLATELETS: 178 10*3/uL (ref 150–400)
RBC: 3.94 MIL/uL — AB (ref 4.22–5.81)
RDW: 13.7 % (ref 11.5–15.5)
WBC: 5.9 10*3/uL (ref 4.0–10.5)

## 2015-08-13 NOTE — Progress Notes (Addendum)
Patient ID: Jeffery Copeland, male   DOB: 1945/11/11, 70 y.o.   MRN: [id number] PCP: Dr. 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  He seems to have done well since his redo MVR.  Echo in 4/16 showed EF 50-55% with normal bioprosthetic mitral valve.  He is breathing better than before surgery.  He is short of breath after walking about 200-250 feet, this is stable/chronic and seems to be primarily due to COPD.  No chest pain, lightheadedness, or syncope.  No orthopnea/PND.  No melena or BRBPR. He had one seizure in 8/15 with unremarkable workup.  He is now on Dilantin and has had no recurrence.   Labs (4/14): K 4.1, creatinine 1.3 Labs (10/14): K 4.5, creatinine 1.3 Labs (7/15): LDL 64, HDL 44 Labs (8/15): K 4.5, creatinine 1.19 Labs (1/16): HCT 40.4 Labs (10/16): K 4.6, creatinine 1.0, HCT 37.8  PMH: 1. Fe deficiency anemia 2. 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  Echo (2/14) with EF 55-60%, bioprosthetic mitral valve with mean gradient 8 mmHg, no definite MR noted though there was significant shadowing, severe LAE.  TEE after 2/14 echo showed severe mitral regurgitation involving the bioprosthetic MV.  Patient had right mini-thoracotomy with redo MVR (bioprosthetic) in 8/14.  Echo (10/14) showed EF 55-60% with normal bioprosthetic mitral valve and PA systolic pressure 32 mmHg. Echo (4/16) with EF 50-55%, normal bioprosthetic MV with trivial MR.  3. COPD 4. GERD 5. Lung nodules: LLL nodule removed in 2008 by left lower lobectomy.  RLL nodule, currently observing.  6. Atrial fibrillation: Chronic, on coumadin. 7. Chronic diastolic CHF 8. LHC (7/14) with no significant CAD.  9. Seizure disorder: 8/15 had a single seizure, workup  unremarkable, now on Dilantin 9. Prostate cancer: Treated with radiation.   SH: Divorced, lives alone in [place].  Quit smoking in 2006.    FH: No premature CAD.  ROS: All systems reviewed and negative except as per HPI.   Current Outpatient Prescriptions  Medication Sig Dispense Refill  . Cholecalciferol (VITAMIN D3) 1000 UNITS CAPS Take 1 capsule by mouth daily.     Marland Kitchen ezetimibe (ZETIA) 10 MG tablet Take 1 tablet (10 mg total) by mouth daily. 30 tablet 6  . methocarbamol (ROBAXIN) 500 MG tablet Take 500 mg by mouth 2 (two) times daily.    . metoprolol tartrate (LOPRESSOR) 25 MG tablet TAKE 1/2 TABLET BY MOUTH TWICE A DAY 30 tablet 4  . oxyCODONE (OXY IR/ROXICODONE) 5 MG immediate release tablet Take 1 tablet by mouth every 6 (six) hours as needed for moderate pain or severe pain.     . phenytoin (DILANTIN) 100 MG ER capsule Take 300 mg by mouth daily.    . ramipril (ALTACE) 5 MG capsule TAKE ONE CAPSULE BY MOUTH EVERY DAY 30 capsule 4  . tiotropium (SPIRIVA HANDIHALER) 18 MCG inhalation capsule PLACE 1 CAPSULE INTO INHALER & INHALE DAILY 90 capsule 4  . warfarin (COUMADIN) 5 MG tablet TAKE AS DIRECTED BY ANTICOAGULATION CLINIC 100 tablet 1  . zolpidem (AMBIEN) 5 MG tablet Take 5 mg by mouth at bedtime as needed. For insomnia.     No current facility-administered medications for this visit.    BP 130/84 mmHg  Pulse 62  Ht 5\' 9"  (1.753 m)  Wt 158  lb (71.668 kg)  BMI 23.32 kg/m2 General: NAD Neck: No JVD, no thyromegaly or thyroid nodule.  Lungs: Clear to auscultation bilaterally with normal respiratory effort. CV: Nondisplaced PMI.  Heart regular S1/S2, 1/6 SEM RUSB.  No peripheral edema.  No carotid bruit.  Normal pedal pulses.  Abdomen: Soft, nontender, no hepatosplenomegaly, no distention.  Skin: Intact without lesions or rashes.  Neurologic: Alert and oriented x 3.  Psych: Normal affect. Extremities: No clubbing or cyanosis.   Assessment/Plan:  1. Bioprosthetic mitral  valve:  Redo MVR in 8/14.  Valve looked good on 4/16 echo.  He seems to be stable.  2. Atrial fibrillation: Chronic, good rate control on current dose of metoprolol.  Continue anticoagulation with coumadin given valvular atrial fibrillation. CBC today.   3. Chronic diastolic CHF: He is not volume overloaded on exam. He is now off Lasix.   4. COPD: Stable symptoms, he is on Spiriva.  He no longer smokes.  5. Check lipids today.   Loralie Champagne 08/13/2015

## 2015-08-23 ENCOUNTER — Other Ambulatory Visit: Payer: Self-pay | Admitting: Critical Care Medicine

## 2015-08-26 DIAGNOSIS — I10 Essential (primary) hypertension: Secondary | ICD-10-CM | POA: Diagnosis not present

## 2015-08-26 DIAGNOSIS — M545 Low back pain: Secondary | ICD-10-CM | POA: Diagnosis not present

## 2015-08-28 ENCOUNTER — Telehealth: Payer: Self-pay | Admitting: Pulmonary Disease

## 2015-08-28 NOTE — Telephone Encounter (Signed)
PFT 03/03/13: FVC 2.85 L (76%) FEV1 1.78 L (64%) FEV1/FVC 0.63 no bronchodilator response TLC 5.50 L (91%) RV 110% (unable to obtain DLCO)  CARDIAC TTE (11/08/14):  LV w/ mild LVH. EF 50-55%. No regional wall motion abnormalities. Diastolic dysfunction present. LA moderately dilated & LA normal in size. RV normal in size & function. PASP 41 mmHg. Trivial aortic regurg w/o stenosis. Trivial mitral regurg w/o stenosis. Trivial pulmonic regurg. Trivial tricuspid regurg. No pericardial effusion.   LABS 08/12/15 CBC:  5.9 / 12.6 / 37.6 / 178 BMP:  138 / 4.5 / 101 / 29 / 8 / 1.07 / 70 / 9.0

## 2015-08-29 ENCOUNTER — Other Ambulatory Visit: Payer: Commercial Managed Care - HMO

## 2015-08-29 ENCOUNTER — Encounter: Payer: Self-pay | Admitting: Pulmonary Disease

## 2015-08-29 ENCOUNTER — Ambulatory Visit (INDEPENDENT_AMBULATORY_CARE_PROVIDER_SITE_OTHER): Payer: Commercial Managed Care - HMO | Admitting: Pulmonary Disease

## 2015-08-29 VITALS — BP 132/78 | HR 75 | Ht 69.0 in | Wt 165.4 lb

## 2015-08-29 DIAGNOSIS — J438 Other emphysema: Secondary | ICD-10-CM | POA: Diagnosis not present

## 2015-08-29 DIAGNOSIS — K219 Gastro-esophageal reflux disease without esophagitis: Secondary | ICD-10-CM

## 2015-08-29 DIAGNOSIS — J449 Chronic obstructive pulmonary disease, unspecified: Secondary | ICD-10-CM

## 2015-08-29 DIAGNOSIS — R911 Solitary pulmonary nodule: Secondary | ICD-10-CM | POA: Diagnosis not present

## 2015-08-29 NOTE — Progress Notes (Signed)
Subjective:    Patient ID: Jeffery Copeland, male    DOB: 05/26/1946, 70 y.o.   MRN: [id number]  C.C.:  Follow-up for Moderate COPD w/ Emphysema, 28mm Right Nodule, & GERD.  HPI Moderate COPD w/ Emphysema:  Reports he uses Spiriva daily. Denies any rescue inhaler. No exacerbations since last appointment. He reports intermittent coughing & wheezing. No nocturnal awakenings with coughing or wheezing. Reports he has trouble walking 200-400 feet without stopping due to dyspnea.   Right Lung Nodule:  Patient's last CT was in 2014. Right nodule noted at 47mm.  GERD:  Denies any medication. Reports symptoms are only rare. Denies any morning brash water taste or abdominal pain.   Review of Systems No chest pain, pressure, or tightness. No fever, chills, or sweats.   Allergies  Allergen Reactions  . Rosuvastatin Other (See Comments)    Muscle aches    Current Outpatient Prescriptions on File Prior to Visit  Medication Sig Dispense Refill  . Cholecalciferol (VITAMIN D3) 1000 UNITS CAPS Take 1 capsule by mouth daily.     Marland Kitchen ezetimibe (ZETIA) 10 MG tablet Take 1 tablet (10 mg total) by mouth daily. 30 tablet 6  . methocarbamol (ROBAXIN) 500 MG tablet Take 500 mg by mouth 2 (two) times daily.    . metoprolol tartrate (LOPRESSOR) 25 MG tablet TAKE 1/2 TABLET BY MOUTH TWICE A DAY 30 tablet 4  . oxyCODONE (OXY IR/ROXICODONE) 5 MG immediate release tablet Take 1 tablet by mouth every 6 (six) hours as needed for moderate pain or severe pain.     . phenytoin (DILANTIN) 100 MG ER capsule Take 300 mg by mouth daily.    . ramipril (ALTACE) 5 MG capsule TAKE ONE CAPSULE BY MOUTH EVERY DAY 30 capsule 4  . SPIRIVA HANDIHALER 18 MCG inhalation capsule INHALE THE CONTENTS OF 1 CAPSULE EVERY DAY  VIA  HANDIHALER 90 capsule 0  . warfarin (COUMADIN) 5 MG tablet TAKE AS DIRECTED BY ANTICOAGULATION CLINIC 100 tablet 1  . zolpidem (AMBIEN) 5 MG tablet Take 5 mg by mouth at bedtime as needed. For insomnia.     No current  facility-administered medications on file prior to visit.    Past Medical History  Diagnosis Date  . 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 Cerebrovascular disease, unspecified   . Esophageal reflux   . Generalized osteoarthrosis, unspecified site   . Hypertension   . COPD (chronic obstructive pulmonary disease) ([place]) 1999  . Lung nodules 2008, 2014    LLL nodule removed 2008 with LLL lobectomy,  RLL nodule 21mm observation  . Atrial fibrillation ([place]) 11/11/2008    Qualifier: Diagnosis of  By: Lia Foyer, MD, Jaquelyn Bitter   . CARDIOMYOPATHY 10/20/2008    Qualifier: Diagnosis of  By: Owens Shark, RN, BSN, Lauren    . ISCHEMIC COLITIS, HX OF 10/20/2008    Qualifier: Diagnosis of  By: Owens Shark, RN, BSN, Lauren    . S/P mitral valve replacement 02/01/1998    Carpentier-Edwards porcine bioprosthetic tissue valve, size 29mm Placed for acute and subacute bacterial endocarditis (group G Streptococcus) with pre-existing mitral valve prolapse   . HYPERTENSION, BENIGN 10/20/2008    Qualifier: Diagnosis of  By: Owens Shark, RN, BSN, Lauren    . Epidural hematoma ([place]) 03/01/2011    Recent fall 2012   . Severe mitral regurgitation 02/20/2013  . Iron deficiency anemia, unspecified   . S/P mitral valve replacement with bioprosthetic valve 03/24/2013  Redo mitral valve replacement using 21mm Edwards Magna mitral bovine bioprosthetic tissue valve performed via right mini thoracotomy  . Seizures ([place])   . Prostate cancer [facility])     Past Surgical History  Procedure Laterality Date  . Mitral valve replacement  02/01/1998    Carpentier-Edwards porcine bioprosthetic tissue valve, size 61mm, placed for complicated bacterial endocarditis  . Video assisted thoracoscopy  04/29/2007    Left VATS w/ mini thoracotomy for Left Lower Lobectomy for benign lung nodules  . Tee without cardioversion N/A 02/12/2013    Procedure: TRANSESOPHAGEAL ECHOCARDIOGRAM (TEE);  Surgeon: Fay Records, MD;   Location: [facility] ENDOSCOPY;  Service: Cardiovascular;  Laterality: N/A;  . Mitral valve replacement Right 03/24/2013    Procedure: MINIMALLY INVASIVE REDO MITRAL VALVE (MV) REPLACEMENT;  Surgeon: Rexene Alberts, MD;  Location: [place];  Service: Open Heart Surgery;  Laterality: Right;  . Minimally invasive maze procedure N/A 03/24/2013    Procedure: MINIMALLY INVASIVE MAZE PROCEDURE;  Surgeon: Rexene Alberts, MD;  Location: [place];  Service: Open Heart Surgery;  Laterality: N/A;  . Intraoperative transesophageal echocardiogram N/A 03/24/2013    Procedure: INTRAOPERATIVE TRANSESOPHAGEAL ECHOCARDIOGRAM;  Surgeon: Rexene Alberts, MD;  Location: [place];  Service: Open Heart Surgery;  Laterality: N/A;  . Prostate biopsy      Family History  Problem Relation Age of Onset  . Cancer Father     prostate cancer  . Tuberculosis Father   . Heart attack Father   . Cancer Brother     prostate cancer  . Lung disease Neg Hx     Social History   Social History  . Marital Status: Divorced    Spouse Name: N/A  . Number of Children: 2  . Years of Education: 9   Occupational History  . disabled    Social History Main Topics  . Smoking status: Former Smoker -- 2.00 packs/day for 45 years    Types: Cigarettes    Quit date: 07/30/2006  . Smokeless tobacco: Former Systems developer    Quit date: 07/31/2007  . Alcohol Use: No     Comment: previous alcohol use & home-made alcohol consumption  . Drug Use: No     Comment: previous marijuana  . Sexual Activity: Not Currently   Other Topics Concern  . None   Social History Narrative   Patient is divorced and has 2 children.   Patient is right handed.   Patient has 9 th grade education.    Patient drinks diet sodas.      [facility]:   From [place] originally. Always lived in [place]. 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 Previously traveled to [place], [place], & [place]. No pets currently. Remote pet owl and parakeet exposure. No mold exposure.  Enjoys fishing.       Objective:   Physical Exam BP 132/78 mmHg  Pulse 75  Ht 5\' 9"  (1.753 m)  Wt 165 lb 6.4 oz (75.025 kg)  BMI 24.41 kg/m2  SpO2 98% General:  No distress. Awake. Alert. Integument:  Warm & dry. No rash on exposed skin. HEENT:  Moist mucus membranes. No oral ulcers. No nasal turbinate swelling. Lymphatics:  No appreciated cervical or supraclavicular LAD. Cardiovascular:  Regular rate. No edema. No appreciable JVD. Pulmonary:  Good aeration & clear to auscultation bilaterally. Normal work of breathing on room air. Abdomen:  Soft. Nondistended. Nontender. Musculoskeletal:  Normal bulk and tone. No joint effusion appreciated.   PFT 03/03/13: FVC 2.85 L (  76%) FEV1 1.78 L (64%) FEV1/FVC 0.63 no bronchodilator response TLC 5.50 L (91%) RV 110% (unable to obtain DLCO)   IMAGING CHEST CT W/O 09/19/12 (per radiologist):  106mm nodule along right major fissure. Moderate centrilobular emphysema. Left lower lobectomy noted. Heart mildly enlarged w/ prosthetic mitral valve. No pathologic mediastinal lymph nodes.  CARDIAC TTE (11/08/14): LV w/ mild LVH. EF 50-55%. No regional wall motion abnormalities. Diastolic dysfunction present. LA moderately dilated & LA normal in size. RV normal in size & function. PASP 41 mmHg. Trivial aortic regurg w/o stenosis. Trivial mitral regurg w/o stenosis. Trivial pulmonic regurg. Trivial tricuspid regurg. No pericardial effusion.   LABS 08/12/15 CBC: 5.9 / 12.6 / 37.6 / 178 BMP: 138 / 4.5 / 101 / 29 / 8 / 1.07 / 70 / 9.0    Assessment & Plan:  70 year old male with known moderate COPD & emphysema.  I reviewed his prior spirometry which did show moderate obstruction but they were unable to obtain a DLCO. Patient continues to have ongoing dyspnea on exertion which may indicate an oxygen requirement. He has not been screened for alpha-1 antitrypsin yet. He had a right lung nodule in 2014 that needs followed up as well given his long history of  tobacco use.  He doesn't seem to have any regular symptoms from reflux. I instructed the patient to contact my office if he had any new breathing problems before his next appointment.  1. Moderate COPD w/ Emphysema:  Continue Spiriva. Screening for alpha-1 antitrypsin. Checking full PFTs and 6MWT at next appointment. 2. Right Lung Nodule:  Checking CT scan of the chest w/o contrast. 3. GERD:  Asymptomatic off medications. 4. Follow-up:  Return to clinic in 4-6 months or sooner if needed.

## 2015-08-29 NOTE — Patient Instructions (Addendum)
1. Continue taking your Spiriva as prescribed. 2. I'm doing a CT scan of your chest to follow that spot in your lung to make sure it isn't growing. 3. I'm going to have them do a breathing test and walking test at your next appointment. 4. I'm checking you for the genetic form of COPD & Emphysema today with a blood test. 5. Call me if you have any questions or breathing problems before your next appointment. 6. I will see you back in 4-6 months or sooner if needed.  TESTS ORDERED: 1. Alpha-1 Antitryspin Phenotype today 2. Full PFT at next appointment 3. 6MWT on room air at next apointment

## 2015-09-06 LAB — ALPHA-1 ANTITRYPSIN PHENOTYPE: A1 ANTITRYPSIN: 120 mg/dL (ref 83–199)

## 2015-09-09 ENCOUNTER — Ambulatory Visit (INDEPENDENT_AMBULATORY_CARE_PROVIDER_SITE_OTHER)
Admission: RE | Admit: 2015-09-09 | Discharge: 2015-09-09 | Disposition: A | Discharge status: Home / Self Care | Payer: Commercial Managed Care - HMO | Source: Ambulatory Visit | Attending: Pulmonary Disease | Admitting: Pulmonary Disease

## 2015-09-09 ENCOUNTER — Inpatient Hospital Stay: Admission: RE | Admit: 2015-09-09 | Payer: Commercial Managed Care - HMO | Source: Ambulatory Visit

## 2015-09-09 ENCOUNTER — Ambulatory Visit (INDEPENDENT_AMBULATORY_CARE_PROVIDER_SITE_OTHER): Payer: Commercial Managed Care - HMO | Admitting: *Deleted

## 2015-09-09 DIAGNOSIS — Z5181 Encounter for therapeutic drug level monitoring: Secondary | ICD-10-CM

## 2015-09-09 DIAGNOSIS — Z7901 Long term (current) use of anticoagulants: Secondary | ICD-10-CM

## 2015-09-09 DIAGNOSIS — J449 Chronic obstructive pulmonary disease, unspecified: Secondary | ICD-10-CM | POA: Diagnosis not present

## 2015-09-09 DIAGNOSIS — I4891 Unspecified atrial fibrillation: Secondary | ICD-10-CM

## 2015-09-09 DIAGNOSIS — R911 Solitary pulmonary nodule: Secondary | ICD-10-CM

## 2015-09-09 LAB — POCT INR: INR: 1.4

## 2015-09-15 ENCOUNTER — Encounter: Payer: Self-pay | Admitting: Radiation Oncology

## 2015-09-15 ENCOUNTER — Ambulatory Visit
Admission: RE | Admit: 2015-09-15 | Discharge: 2015-09-15 | Disposition: A | Discharge status: Home / Self Care | Payer: Commercial Managed Care - HMO | Source: Ambulatory Visit | Attending: Radiation Oncology | Admitting: Radiation Oncology

## 2015-09-15 VITALS — BP 129/68 | HR 68 | Resp 16 | Wt 163.2 lb

## 2015-09-15 DIAGNOSIS — C61 Malignant neoplasm of prostate: Secondary | ICD-10-CM

## 2015-09-15 NOTE — Progress Notes (Signed)
Radiation Oncology         [phone] ________________________________  Name: Jeffery Copeland MRN: [id number]  Date: 09/15/2015  DOB: Feb 02, 1946  Follow-Up Visit Note  CC: Maximino Greenland, MD  Ladell Pier, MD  Diagnosis: 70 y.o. gentleman with stage T1c adenocarcinoma of the prostate with a Gleason's score of 3+4 and a PSA of 3.62       ICD-9-CM ICD-10-CM   1. Malignant neoplasm of prostate ([place]) 185 C61     Interval Since Last Radiation: 3 months   05/04/15-06/29/15: The prostate was treated to 78 Gy in 40 fractions of 1.95 Gy  Narrative:  The patient returns today for routine follow-up. Denies diarrhea, rectal irritation, or pain. Reports he has increased his fluid intake and therefore continues to have nocturia x 4. Reports dysuria has resolved. Denies hematuria. Describes a strong steady urine stream without difficulty emptying. Reports his energy level has returned to normal. Has not seen his urologist since completion and no appointment set for follow up. This RN will contact [facility] and phone patient later today with appointment date and time.   ALLERGIES:  is allergic to rosuvastatin.  Meds: Current Outpatient Prescriptions  Medication Sig Dispense Refill  . Cholecalciferol (VITAMIN D3) 1000 UNITS CAPS Take 1 capsule by mouth daily.     Marland Kitchen ezetimibe (ZETIA) 10 MG tablet Take 1 tablet (10 mg total) by mouth daily. 30 tablet 6  . methocarbamol (ROBAXIN) 500 MG tablet Take 500 mg by mouth 2 (two) times daily.    . metoprolol tartrate (LOPRESSOR) 25 MG tablet TAKE 1/2 TABLET BY MOUTH TWICE A DAY 30 tablet 4  . oxyCODONE (OXY IR/ROXICODONE) 5 MG immediate release tablet Take 1 tablet by mouth every 6 (six) hours as needed for moderate pain or severe pain.     . phenytoin (DILANTIN) 100 MG ER capsule Take 300 mg by mouth daily.    . ramipril (ALTACE) 5 MG capsule TAKE ONE CAPSULE BY MOUTH EVERY DAY 30 capsule 4  . SPIRIVA HANDIHALER 18 MCG inhalation capsule INHALE THE  CONTENTS OF 1 CAPSULE EVERY DAY  VIA  HANDIHALER 90 capsule 0  . warfarin (COUMADIN) 5 MG tablet TAKE AS DIRECTED BY ANTICOAGULATION CLINIC 100 tablet 1  . zolpidem (AMBIEN) 5 MG tablet Take 5 mg by mouth at bedtime as needed. For insomnia.     No current facility-administered medications for this encounter.    Physical Findings: The patient is in no acute distress. Patient is alert and oriented.  weight is 163 lb 3.2 oz (74.027 kg). His blood pressure is 129/68 and his pulse is 68. His respiration is 16 and oxygen saturation is 100%. .  No significant changes.  Lab Findings: Lab Results  Component Value Date   WBC 5.9 08/12/2015   WBC 6.1 05/25/2015   HGB 12.6* 08/12/2015   HCT 37.6* 08/12/2015   HCT 37.8 05/25/2015   PLT 178 08/12/2015   PLT 217 05/25/2015    Lab Results  Component Value Date   NA 138 08/12/2015   NA 139 05/25/2015   K 4.5 08/12/2015   CO2 29 08/12/2015   GLUCOSE 70 08/12/2015   GLUCOSE 96 05/25/2015   BUN 8 08/12/2015   BUN 6* 05/25/2015   CREATININE 1.07 08/12/2015   CREATININE 1.00 05/25/2015   BILITOT 0.5 05/25/2015   BILITOT 0.8 02/28/2014   ALKPHOS 118* 05/25/2015   AST 16 05/25/2015   ALT 13 05/25/2015   PROT 7.0 05/25/2015   PROT 6.4  02/28/2014   ALBUMIN 4.5 05/25/2015   ALBUMIN 3.8 02/28/2014   CALCIUM 9.0 08/12/2015   ANIONGAP 13 02/28/2014    Radiographic Findings: Ct Chest Wo Contrast  09/09/2015  CLINICAL DATA:  Lung nodule follow-up. History of COPD, shortness of breath, cough, and prostate cancer. EXAM: CT CHEST WITHOUT CONTRAST TECHNIQUE: Multidetector CT imaging of the chest was performed following the standard protocol without IV contrast. COMPARISON:  12/16/2013 FINDINGS: No enlarged axillary, mediastinal, or hilar lymph nodes are identified. Sequelae of prior median sternotomy and mitral valve replacement are again identified. Minimal right coronary artery calcification and mild aortic atherosclerotic calcification are noted.  The heart remains mildly enlarged. There is no pericardial effusion or right pleural effusion. Chronic left lung volume loss is again seen related to prior left lower lobectomy. Left basilar atelectasis/scarring and left basilar pleural thickening/ complex loculated small volume pleural fluid are unchanged. Moderate centrilobular emphysema and minimal biapical scarring are again seen. A 4 mm right middle lobe nodule abutting the minor fissure is unchanged from 03/13/2013 (series 3, image 31). No other nodules are identified. Interpolar left renal cyst is partially visualized, measuring approximately 2.8 cm. Numerous, moderate to severe compression fractures are again seen throughout the thoracic spine. A mild T12 superior endplate compression fracture is new from 2015 but may be chronic without evidence of significant thickened paravertebral hematoma or clearly visible fracture line. Exaggerated thoracic kyphosis and postsurgical changes involving multiple posterior left ribs are again noted. IMPRESSION: 1. Stable postsurgical changes in the left lung. 2. 4 mm right middle lobe nodule, unchanged from 2014 and considered benign. 3. Mild T12 compression fracture, new from 2015 but favored to be chronic. Numerous other old compression fractures throughout the thoracic spine. Electronically Signed   By: Logan Bores M.D.   On: 09/09/2015 14:25   Impression:  The patient is recovering from the effects of radiation.  Plan: He will continue to follow-up with urology for ongoing PSA determinations.  I will look forward to following his response through their correspondence, and be happy to participate in care if clinically indicated.  I talked to the patient about what to expect in the future, including his risk for erectile dysfunction and rectal bleeding.  I encouraged him to call or return to the office if he has any question about his previous radiation or possible radiation effects.  He was comfortable with this  plan.  He is scheduled to see Dr. Louis Meckel on March 14. _____________________________________  Sheral Apley. Tammi Klippel, M.D.  This document serves as a record of services personally performed by [name], MD. It was created on his behalf by Darcus Austin, a trained medical scribe. The creation of this record is based on the scribe's personal observations and the provider's statements to them. This document has been checked and approved by the attending provider.

## 2015-09-15 NOTE — Progress Notes (Signed)
Weight and vitals stable. Denies pain. Denies diarrhea or rectal irritation. Reports he has increased his fluid intake thus continues to have nocturia x 4. Reports dysuria has resolved. Denies hematuria. Describes a strong steady urine stream without difficulty emptying. Reports his energy level has returned to normal. Has not seen urologist since completion and no appointment set for follow up. This RN will contact [facility] and phone patient later today with appointment date and time.   BP 129/68 mmHg  Pulse 68  Resp 16  Wt 163 lb 3.2 oz (74.027 kg)  SpO2 100% Wt Readings from Last 3 Encounters:  09/15/15 163 lb 3.2 oz (74.027 kg)  08/29/15 165 lb 6.4 oz (75.025 kg)  08/12/15 158 lb (71.668 kg)

## 2015-09-19 DIAGNOSIS — H2512 Age-related nuclear cataract, left eye: Secondary | ICD-10-CM | POA: Diagnosis not present

## 2015-09-22 DIAGNOSIS — H2512 Age-related nuclear cataract, left eye: Secondary | ICD-10-CM | POA: Diagnosis not present

## 2015-09-23 ENCOUNTER — Ambulatory Visit (INDEPENDENT_AMBULATORY_CARE_PROVIDER_SITE_OTHER): Payer: Commercial Managed Care - HMO | Admitting: Surgery

## 2015-09-23 DIAGNOSIS — I4891 Unspecified atrial fibrillation: Secondary | ICD-10-CM | POA: Diagnosis not present

## 2015-09-23 DIAGNOSIS — Z7901 Long term (current) use of anticoagulants: Secondary | ICD-10-CM

## 2015-09-23 DIAGNOSIS — Z5181 Encounter for therapeutic drug level monitoring: Secondary | ICD-10-CM

## 2015-09-23 LAB — POCT INR: INR: 1.8

## 2015-10-06 ENCOUNTER — Telehealth: Payer: Self-pay | Admitting: Adult Health

## 2015-10-06 MED ORDER — PHENYTOIN SODIUM EXTENDED 100 MG PO CAPS: 100.0000 mg | ORAL_CAPSULE | Freq: Every day | ORAL | Status: DC

## 2015-10-06 NOTE — Telephone Encounter (Signed)
Pt needs refill on phenytoin (DILANTIN) 100 MG ER capsule. Thank you

## 2015-10-07 ENCOUNTER — Ambulatory Visit (INDEPENDENT_AMBULATORY_CARE_PROVIDER_SITE_OTHER): Payer: Commercial Managed Care - HMO | Admitting: Surgery

## 2015-10-07 DIAGNOSIS — Z7901 Long term (current) use of anticoagulants: Secondary | ICD-10-CM | POA: Diagnosis not present

## 2015-10-07 DIAGNOSIS — Z5181 Encounter for therapeutic drug level monitoring: Secondary | ICD-10-CM | POA: Diagnosis not present

## 2015-10-07 DIAGNOSIS — I4891 Unspecified atrial fibrillation: Secondary | ICD-10-CM

## 2015-10-07 LAB — POCT INR: INR: 2

## 2015-10-11 DIAGNOSIS — Z0000 Encounter for general adult medical examination without abnormal findings: Secondary | ICD-10-CM | POA: Diagnosis not present

## 2015-10-11 DIAGNOSIS — C61 Malignant neoplasm of prostate: Secondary | ICD-10-CM | POA: Diagnosis not present

## 2015-10-17 DIAGNOSIS — G894 Chronic pain syndrome: Secondary | ICD-10-CM | POA: Diagnosis not present

## 2015-10-17 DIAGNOSIS — M533 Sacrococcygeal disorders, not elsewhere classified: Secondary | ICD-10-CM | POA: Diagnosis not present

## 2015-10-24 ENCOUNTER — Emergency Department (HOSPITAL_COMMUNITY): Payer: Commercial Managed Care - HMO

## 2015-10-24 ENCOUNTER — Encounter (HOSPITAL_COMMUNITY): Payer: Self-pay

## 2015-10-24 ENCOUNTER — Observation Stay (HOSPITAL_COMMUNITY)
Admission: EM | Admit: 2015-10-24 | Discharge: 2015-10-26 | Disposition: A | Discharge status: Skilled Nursing Facility | Payer: Commercial Managed Care - HMO | Attending: Internal Medicine | Admitting: Internal Medicine

## 2015-10-24 DIAGNOSIS — S0990XA Unspecified injury of head, initial encounter: Secondary | ICD-10-CM | POA: Diagnosis not present

## 2015-10-24 DIAGNOSIS — Z952 Presence of prosthetic heart valve: Secondary | ICD-10-CM

## 2015-10-24 DIAGNOSIS — J439 Emphysema, unspecified: Secondary | ICD-10-CM | POA: Diagnosis present

## 2015-10-24 DIAGNOSIS — N499 Inflammatory disorder of unspecified male genital organ: Secondary | ICD-10-CM | POA: Diagnosis not present

## 2015-10-24 DIAGNOSIS — I10 Essential (primary) hypertension: Secondary | ICD-10-CM | POA: Insufficient documentation

## 2015-10-24 DIAGNOSIS — S79911A Unspecified injury of right hip, initial encounter: Secondary | ICD-10-CM | POA: Diagnosis not present

## 2015-10-24 DIAGNOSIS — Z87891 Personal history of nicotine dependence: Secondary | ICD-10-CM

## 2015-10-24 DIAGNOSIS — Y92512 Supermarket, store or market as the place of occurrence of the external cause: Secondary | ICD-10-CM | POA: Insufficient documentation

## 2015-10-24 DIAGNOSIS — W010XXA Fall on same level from slipping, tripping and stumbling without subsequent striking against object, initial encounter: Secondary | ICD-10-CM | POA: Insufficient documentation

## 2015-10-24 DIAGNOSIS — Z7901 Long term (current) use of anticoagulants: Secondary | ICD-10-CM | POA: Diagnosis not present

## 2015-10-24 DIAGNOSIS — R1031 Right lower quadrant pain: Secondary | ICD-10-CM | POA: Diagnosis not present

## 2015-10-24 DIAGNOSIS — M159 Polyosteoarthritis, unspecified: Secondary | ICD-10-CM | POA: Diagnosis not present

## 2015-10-24 DIAGNOSIS — R569 Unspecified convulsions: Secondary | ICD-10-CM | POA: Insufficient documentation

## 2015-10-24 DIAGNOSIS — K219 Gastro-esophageal reflux disease without esophagitis: Secondary | ICD-10-CM | POA: Insufficient documentation

## 2015-10-24 DIAGNOSIS — S32471A Displaced fracture of medial wall of right acetabulum, initial encounter for closed fracture: Secondary | ICD-10-CM | POA: Diagnosis not present

## 2015-10-24 DIAGNOSIS — I4891 Unspecified atrial fibrillation: Secondary | ICD-10-CM | POA: Diagnosis not present

## 2015-10-24 DIAGNOSIS — Z79899 Other long term (current) drug therapy: Secondary | ICD-10-CM | POA: Insufficient documentation

## 2015-10-24 DIAGNOSIS — Z954 Presence of other heart-valve replacement: Secondary | ICD-10-CM | POA: Diagnosis not present

## 2015-10-24 DIAGNOSIS — Y9389 Activity, other specified: Secondary | ICD-10-CM | POA: Insufficient documentation

## 2015-10-24 DIAGNOSIS — I429 Cardiomyopathy, unspecified: Secondary | ICD-10-CM | POA: Diagnosis not present

## 2015-10-24 DIAGNOSIS — M25551 Pain in right hip: Secondary | ICD-10-CM | POA: Diagnosis not present

## 2015-10-24 DIAGNOSIS — J438 Other emphysema: Secondary | ICD-10-CM | POA: Diagnosis not present

## 2015-10-24 DIAGNOSIS — R079 Chest pain, unspecified: Secondary | ICD-10-CM | POA: Diagnosis not present

## 2015-10-24 DIAGNOSIS — Z8546 Personal history of malignant neoplasm of prostate: Secondary | ICD-10-CM | POA: Insufficient documentation

## 2015-10-24 DIAGNOSIS — R0602 Shortness of breath: Secondary | ICD-10-CM | POA: Diagnosis not present

## 2015-10-24 DIAGNOSIS — S32401A Unspecified fracture of right acetabulum, initial encounter for closed fracture: Secondary | ICD-10-CM | POA: Diagnosis not present

## 2015-10-24 DIAGNOSIS — R52 Pain, unspecified: Secondary | ICD-10-CM | POA: Diagnosis not present

## 2015-10-24 LAB — CBC WITH DIFFERENTIAL/PLATELET
BASOS ABS: 0 10*3/uL (ref 0.0–0.1)
Basophils Relative: 0 %
Eosinophils Absolute: 0 10*3/uL (ref 0.0–0.7)
Eosinophils Relative: 0 %
HEMATOCRIT: 34.3 % — AB (ref 39.0–52.0)
Hemoglobin: 11.8 g/dL — ABNORMAL LOW (ref 13.0–17.0)
LYMPHS ABS: 1.1 10*3/uL (ref 0.7–4.0)
LYMPHS PCT: 10 %
MCH: 32.5 pg (ref 26.0–34.0)
MCHC: 34.4 g/dL (ref 30.0–36.0)
MCV: 94.5 fL (ref 78.0–100.0)
Monocytes Absolute: 0.9 10*3/uL (ref 0.1–1.0)
Monocytes Relative: 8 %
NEUTROS ABS: 8.7 10*3/uL — AB (ref 1.7–7.7)
Neutrophils Relative %: 82 %
Platelets: 209 10*3/uL (ref 150–400)
RBC: 3.63 MIL/uL — AB (ref 4.22–5.81)
RDW: 12.5 % (ref 11.5–15.5)
WBC: 10.7 10*3/uL — AB (ref 4.0–10.5)

## 2015-10-24 LAB — COMPREHENSIVE METABOLIC PANEL
ALK PHOS: 88 U/L (ref 38–126)
ALT: 29 U/L (ref 17–63)
AST: 27 U/L (ref 15–41)
Albumin: 4.1 g/dL (ref 3.5–5.0)
Anion gap: 8 (ref 5–15)
BILIRUBIN TOTAL: 0.5 mg/dL (ref 0.3–1.2)
BUN: 9 mg/dL (ref 6–20)
CHLORIDE: 97 mmol/L — AB (ref 101–111)
CO2: 25 mmol/L (ref 22–32)
Calcium: 8.7 mg/dL — ABNORMAL LOW (ref 8.9–10.3)
Creatinine, Ser: 0.9 mg/dL (ref 0.61–1.24)
GFR calc Af Amer: >60 mL/min (ref >60)
GLUCOSE: 139 mg/dL — AB (ref 65–99)
POTASSIUM: 4.5 mmol/L (ref 3.5–5.1)
Sodium: 130 mmol/L — ABNORMAL LOW (ref 135–145)
Total Protein: 6.6 g/dL (ref 6.5–8.1)

## 2015-10-24 LAB — I-STAT TROPONIN, ED
TROPONIN I, POC: 0.01 ng/mL (ref 0.00–0.08)
TROPONIN I, POC: 0.01 ng/mL (ref 0.00–0.08)
Troponin i, poc: 0 ng/mL (ref 0.00–0.08)

## 2015-10-24 LAB — PROTIME-INR
INR: 3.65 — AB (ref 0.00–1.49)
PROTHROMBIN TIME: 34.4 s — AB (ref 11.6–15.2)

## 2015-10-24 LAB — PHENYTOIN LEVEL, TOTAL: Phenytoin Lvl: <2.5 ug/mL — ABNORMAL LOW (ref 10.0–20.0)

## 2015-10-24 LAB — LIPASE, BLOOD: Lipase: 28 U/L (ref 11–51)

## 2015-10-24 MED ORDER — FENTANYL CITRATE (PF) 100 MCG/2ML IJ SOLN
25.0000 ug | Freq: Once | INTRAMUSCULAR | Status: AC
  Administered 2015-10-24: 25 ug via INTRAVENOUS
  Filled 2015-10-24: qty 2

## 2015-10-24 MED ORDER — ACETAMINOPHEN 650 MG RE SUPP: 650.0000 mg | Freq: Four times a day (QID) | RECTAL | Status: DC | PRN

## 2015-10-24 MED ORDER — OXYCODONE HCL 5 MG PO TABS
5.0000 mg | ORAL_TABLET | ORAL | Status: DC | PRN
  Administered 2015-10-25 – 2015-10-26 (×7): 10 mg via ORAL
  Filled 2015-10-24 (×8): qty 2

## 2015-10-24 MED ORDER — WARFARIN - PHARMACIST DOSING INPATIENT: Freq: Every day | Status: DC

## 2015-10-24 MED ORDER — FENTANYL CITRATE (PF) 100 MCG/2ML IJ SOLN
50.0000 ug | Freq: Once | INTRAMUSCULAR | Status: AC
  Administered 2015-10-24: 50 ug via INTRAVENOUS
  Filled 2015-10-24: qty 2

## 2015-10-24 MED ORDER — HYDROMORPHONE HCL 1 MG/ML IJ SOLN
0.5000 mg | INTRAMUSCULAR | Status: DC | PRN
  Administered 2015-10-25: 0.5 mg via INTRAVENOUS
  Filled 2015-10-24: qty 1

## 2015-10-24 MED ORDER — RAMIPRIL 5 MG PO CAPS
5.0000 mg | ORAL_CAPSULE | Freq: Every day | ORAL | Status: DC
  Administered 2015-10-25 – 2015-10-26 (×3): 5 mg via ORAL
  Filled 2015-10-24 (×3): qty 1

## 2015-10-24 MED ORDER — SODIUM CHLORIDE 0.9 % IV SOLN
700.0000 mg | Freq: Once | INTRAVENOUS | Status: AC
  Administered 2015-10-24: 700 mg via INTRAVENOUS
  Filled 2015-10-24: qty 4

## 2015-10-24 MED ORDER — OXYCODONE-ACETAMINOPHEN 5-325 MG PO TABS
1.0000 | ORAL_TABLET | Freq: Once | ORAL | Status: AC
  Administered 2015-10-24: 1 via ORAL
  Filled 2015-10-24: qty 1

## 2015-10-24 MED ORDER — IOPAMIDOL (ISOVUE-300) INJECTION 61%
100.0000 mL | Freq: Once | INTRAVENOUS | Status: AC | PRN
  Administered 2015-10-24: 100 mL via INTRAVENOUS

## 2015-10-24 MED ORDER — ONDANSETRON HCL 4 MG PO TABS: 4.0000 mg | ORAL_TABLET | Freq: Four times a day (QID) | ORAL | Status: DC | PRN

## 2015-10-24 MED ORDER — ACETAMINOPHEN 325 MG PO TABS: 650.0000 mg | ORAL_TABLET | Freq: Four times a day (QID) | ORAL | Status: DC | PRN

## 2015-10-24 MED ORDER — VITAMIN D 1000 UNITS PO TABS
1000.0000 [IU] | ORAL_TABLET | Freq: Every day | ORAL | Status: DC
  Administered 2015-10-24 – 2015-10-26 (×3): 1000 [IU] via ORAL
  Filled 2015-10-24 (×3): qty 1

## 2015-10-24 MED ORDER — LORAZEPAM 2 MG/ML IJ SOLN: 1.0000 mg | Freq: Once | INTRAMUSCULAR | Status: DC

## 2015-10-24 MED ORDER — FENTANYL CITRATE (PF) 100 MCG/2ML IJ SOLN: 50.0000 ug | Freq: Once | INTRAMUSCULAR | Status: DC

## 2015-10-24 MED ORDER — IOPAMIDOL (ISOVUE-370) INJECTION 76%
100.0000 mL | Freq: Once | INTRAVENOUS | Status: AC | PRN
  Administered 2015-10-24: 100 mL via INTRAVENOUS

## 2015-10-24 MED ORDER — ACETAMINOPHEN 325 MG PO TABS
650.0000 mg | ORAL_TABLET | Freq: Once | ORAL | Status: AC
  Administered 2015-10-24: 650 mg via ORAL
  Filled 2015-10-24: qty 2

## 2015-10-24 MED ORDER — METHOCARBAMOL 500 MG PO TABS
500.0000 mg | ORAL_TABLET | Freq: Two times a day (BID) | ORAL | Status: DC
  Administered 2015-10-24 – 2015-10-26 (×4): 500 mg via ORAL
  Filled 2015-10-24 (×5): qty 1

## 2015-10-24 MED ORDER — ONDANSETRON HCL 4 MG/2ML IJ SOLN: 4.0000 mg | Freq: Four times a day (QID) | INTRAMUSCULAR | Status: DC | PRN

## 2015-10-24 MED ORDER — TIOTROPIUM BROMIDE MONOHYDRATE 18 MCG IN CAPS
18.0000 ug | ORAL_CAPSULE | Freq: Every day | RESPIRATORY_TRACT | Status: DC
  Administered 2015-10-26: 18 ug via RESPIRATORY_TRACT
  Filled 2015-10-24: qty 5

## 2015-10-24 MED ORDER — POLYETHYLENE GLYCOL 3350 17 G PO PACK
17.0000 g | PACK | Freq: Every day | ORAL | Status: DC | PRN
Start: 2015-10-24 — End: 2015-10-26
  Administered 2015-10-25 – 2015-10-26 (×2): 17 g via ORAL
  Filled 2015-10-24 (×2): qty 1

## 2015-10-24 MED ORDER — LORAZEPAM 2 MG/ML IJ SOLN: 1.0000 mg | INTRAMUSCULAR | Status: DC | PRN

## 2015-10-24 MED ORDER — EZETIMIBE 10 MG PO TABS
10.0000 mg | ORAL_TABLET | Freq: Every day | ORAL | Status: DC
  Administered 2015-10-24 – 2015-10-26 (×3): 10 mg via ORAL
  Filled 2015-10-24 (×3): qty 1

## 2015-10-24 MED ORDER — SODIUM CHLORIDE 0.9 % IV BOLUS (SEPSIS)
500.0000 mL | Freq: Once | INTRAVENOUS | Status: AC
Start: 2015-10-24 — End: 2015-10-24
  Administered 2015-10-24: 500 mL via INTRAVENOUS

## 2015-10-24 MED ORDER — IOHEXOL 300 MG/ML  SOLN
50.0000 mL | Freq: Once | INTRAMUSCULAR | Status: AC | PRN
  Administered 2015-10-24: 50 mL via ORAL

## 2015-10-24 MED ORDER — PHENYTOIN SODIUM EXTENDED 100 MG PO CAPS
300.0000 mg | ORAL_CAPSULE | Freq: Every day | ORAL | Status: DC
  Administered 2015-10-24 – 2015-10-25 (×2): 300 mg via ORAL
  Filled 2015-10-24 (×3): qty 3

## 2015-10-24 MED ORDER — MORPHINE SULFATE (PF) 4 MG/ML IV SOLN
4.0000 mg | Freq: Once | INTRAVENOUS | Status: AC
  Administered 2015-10-24: 4 mg via INTRAVENOUS
  Filled 2015-10-24: qty 1

## 2015-10-24 MED ORDER — METOPROLOL TARTRATE 25 MG PO TABS
12.5000 mg | ORAL_TABLET | Freq: Two times a day (BID) | ORAL | Status: DC
  Administered 2015-10-24 – 2015-10-25 (×2): 12.5 mg via ORAL
  Filled 2015-10-24 (×2): qty 1

## 2015-10-24 MED ORDER — PHENYTOIN SODIUM EXTENDED 100 MG PO CAPS
100.0000 mg | ORAL_CAPSULE | Freq: Every day | ORAL | Status: DC
  Filled 2015-10-24 (×2): qty 1

## 2015-10-24 NOTE — Progress Notes (Addendum)
Called by RN because pt had a seizure. According to staff, pt was heard yelling out and gasping for breath. Upon entering room, staff noted pt was drawn up and shaking slightly. This only lasted for about one minute. Upon my arrival, pt opens eyes slightly to tactile stimulation. Moans. No airway compromise. Respirations even and unlabored. PERRL. No extremity shaking or head shaking. Slowly, pt continues to awaken. After 5 minutes, he opens his eyes and follows some commands. Per chart, pt takes Dilantin for seizures and hasn't had dose today. Will check total and free Dilantin level. NPO for now until fully awake. Have him take po Dilantin tonight when awake. EEG ordered and when drug level back, touch base with neuro.  Clance Boll, NP [facility] Update: Dilantin total level < 2.5. Reviewed pt's chart. He saw neuro in October 2016 and reported Dilantin dose at that time was 300mg  ER qhs. Labs were drawn and level was normal. However, when pt admitted, med rec says he is taking 100mg  ER daily. Pt likely non compliant. Spoke to neurology on call, Dr Leonel Ramsay. Will one time dose Fosphenytoin 700mg  IV and start 300mg  Dilantin ER qhs.  KJKG, NP [facility]

## 2015-10-24 NOTE — ED Notes (Signed)
Bed: [id number] Expected date:  Expected time:  Means of arrival:  Comments: EMS 36M abd pain

## 2015-10-24 NOTE — Progress Notes (Signed)
ED RN inquired about status to Home health and DME  Spoke with [facility] staff, [facility] and [place]  Pt has RW in room Cm verified his address for HHPT and pt is ready for d/c  ED RN updated

## 2015-10-24 NOTE — ED Notes (Signed)
Pt transported to CT ?

## 2015-10-24 NOTE — ED Notes (Signed)
[facility] aware of pt response to pain and trending VS.

## 2015-10-24 NOTE — Progress Notes (Signed)
Updated [facility] of [facility] to contact sister about services for pt  Cm confirmed pt address, ans sister contact information to be correct in EPIC with sister

## 2015-10-24 NOTE — Progress Notes (Signed)
Pt returned from imaging Cm called by RN   Spoke with pt who states on Saturday and Sunday he drove "over one 52 miles"  Interested in rolling walker if no or low cost. He is aware that his sister has offered to assist with RW from her church. 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 Cm had to speak in loud tones  Son no longer present CM reviewed Home health, DME, senior resources of [place] and private duty nursing services and copies of resources provided  Pt noted to be rubbing his forehead at intervals   Cm called [facility] about referral for RW to be delivered to [facility] ED pending response  Spoke with Santiago Glad of [facility] about HHPT services - to visit pt

## 2015-10-24 NOTE — Progress Notes (Signed)
Pt 's ED Rn called Cm to speak with pt family member Reports pt unable to tell family what was reviewed with him by home health or Cm Pt awaking when Cm walked in the room Pt states he had pain medication and needs more Cm reviewed resources with sister and gave her the copy of forms on bedside table Sister prefers to be called vs pt He agrees to this Pt states he has issues hearing if they were to call

## 2015-10-24 NOTE — Progress Notes (Signed)
ED RN EDP updated  Sent pcp [facility] an in basket message to updated on visit, pelvic fx and RW & HHPT ordered

## 2015-10-24 NOTE — H&P (Signed)
[facility] History and Physical  Jeffery Copeland [id number] DOB: 02/13/1946 DOA: 10/24/2015  Referring physician: er PCP: Maximino Greenland, MD   Chief Complaint: fall and hip pain  HPI: Jeffery Copeland is a 70 y.o. male  With PMHx of COPD, GERD, HTN and osteoarthritis comes to the ER with c/o pain in his groin that have left him unable to ambulate.  He states Saturday he went to the grocery store and as he was walking out the cart tipped over and he tripped over the back wheel.   In the ER, he has a CT scan that showed a displaced/comminuted fractures of the right acetabulum, involving the anterior and posterior acetabulum, with upwards extension into the right iliac bone just anterior to the right SI joint.  ER doctor spoke with Dr. Lorin Mercy who wanted NBW and to ambulate with walker- no surgical intervention.  Patient was unable to do this in the ER due to pain (given norco 5 prior to ambulation).  Home Health had been arrange but patient lives alone.    Hospitalist were asked to admit for PT eval, pain control and possible placement.  ER doc said Dr. Lorin Mercy would see as inpatient.   Review of Systems:  All systems reviewed, negative unless stated above    Past Medical History  Diagnosis Date  . 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 Cerebrovascular disease, unspecified   . Esophageal reflux   . Generalized osteoarthrosis, unspecified site   . Hypertension   . COPD (chronic obstructive pulmonary disease) ([place]) 1999  . Lung nodules 2008, 2014    LLL nodule removed 2008 with LLL lobectomy,  RLL nodule 39mm observation  . Atrial fibrillation ([place]) 11/11/2008    Qualifier: Diagnosis of  By: Lia Foyer, MD, Jaquelyn Bitter   . CARDIOMYOPATHY 10/20/2008    Qualifier: Diagnosis of  By: Owens Shark, RN, BSN, Lauren    . ISCHEMIC COLITIS, HX OF 10/20/2008    Qualifier: Diagnosis of  By: Owens Shark, RN, BSN, Lauren    . S/P mitral valve replacement 02/01/1998   Carpentier-Edwards porcine bioprosthetic tissue valve, size 54mm Placed for acute and subacute bacterial endocarditis (group G Streptococcus) with pre-existing mitral valve prolapse   . HYPERTENSION, BENIGN 10/20/2008    Qualifier: Diagnosis of  By: Owens Shark, RN, BSN, Lauren    . Epidural hematoma ([place]) 03/01/2011    Recent fall 2012   . Severe mitral regurgitation 02/20/2013  . Iron deficiency anemia, unspecified   . S/P mitral valve replacement with bioprosthetic valve 03/24/2013    Redo mitral valve replacement using 53mm [facility] mitral bovine bioprosthetic tissue valve performed via right mini thoracotomy  . Seizures ([place])   . Prostate cancer [facility])    Past Surgical History  Procedure Laterality Date  . Mitral valve replacement  02/01/1998    Carpentier-Edwards porcine bioprosthetic tissue valve, size 66mm, placed for complicated bacterial endocarditis  . Video assisted thoracoscopy  04/29/2007    Left VATS w/ mini thoracotomy for Left Lower Lobectomy for benign lung nodules  . Tee without cardioversion N/A 02/12/2013    Procedure: TRANSESOPHAGEAL ECHOCARDIOGRAM (TEE);  Surgeon: Fay Records, MD;  Location[facility] ENDOSCOPY;  Service: Cardiovascular;  Laterality: N/A;  . Mitral valve replacement Right 03/24/2013    Procedure: MINIMALLY INVASIVE REDO MITRAL VALVE (MV) REPLACEMENT;  Surgeon: Rexene Alberts, MD;  Location: [place];  Service: Open Heart Surgery;  Laterality: Right;  . Minimally invasive maze procedure N/A 03/24/2013  Procedure: MINIMALLY INVASIVE MAZE PROCEDURE;  Surgeon: Rexene Alberts, MD;  Location: [place];  Service: Open Heart Surgery;  Laterality: N/A;  . Intraoperative transesophageal echocardiogram N/A 03/24/2013    Procedure: INTRAOPERATIVE TRANSESOPHAGEAL ECHOCARDIOGRAM;  Surgeon: Rexene Alberts, MD;  Location: [place];  Service: Open Heart Surgery;  Laterality: N/A;  . Prostate biopsy     Social History:  reports that he quit smoking about 9 years ago. His smoking use  included Cigarettes. He has a 90 pack-year smoking history. He quit smokeless tobacco use about 8 years ago. He reports that he does not drink alcohol or use illicit drugs.  Allergies  Allergen Reactions  . Rosuvastatin Other (See Comments)    Muscle aches    Family History  Problem Relation Age of Onset  . Cancer Father     prostate cancer  . Tuberculosis Father   . Heart attack Father   . Cancer Brother     prostate cancer  . Lung disease Neg Hx     Prior to Admission medications   Medication Sig Start Date End Date Taking? Authorizing Provider  Cholecalciferol (VITAMIN D3) 1000 UNITS CAPS Take 1 capsule by mouth daily.    Yes Historical Provider, MD  ezetimibe (ZETIA) 10 MG tablet Take 1 tablet (10 mg total) by mouth daily. 10/30/12  Yes Hillary Bow, MD  methocarbamol (ROBAXIN) 500 MG tablet Take 500 mg by mouth 2 (two) times daily.   Yes Historical Provider, MD  metoprolol tartrate (LOPRESSOR) 25 MG tablet TAKE 1/2 TABLET BY MOUTH TWICE A DAY Patient taking differently: Take 0.5 tablet by mouth two times a day 03/25/14  Yes Larey Dresser, MD  oxyCODONE (OXY IR/ROXICODONE) 5 MG immediate release tablet Take 1 tablet by mouth every 6 (six) hours as needed for moderate pain or severe pain.    Yes Historical Provider, MD  phenytoin (DILANTIN) 100 MG ER capsule Take 1 capsule (100 mg total) by mouth daily. 10/06/15  Yes Garvin Fila, MD  ramipril (ALTACE) 5 MG capsule TAKE ONE CAPSULE BY MOUTH EVERY DAY Patient taking differently: Take 1 capsule by mouth every day 03/25/14  Yes Larey Dresser, MD  SPIRIVA HANDIHALER 18 MCG inhalation capsule INHALE THE CONTENTS OF 1 CAPSULE EVERY DAY  VIA  HANDIHALER Patient taking differently: Inhale the contents of 1 capsule each day using Handihaler 08/25/15  Yes Javier Glazier, MD  warfarin (COUMADIN) 5 MG tablet TAKE AS [place] Patient taking differently: Take 5-7.5 mg by mouth every evening. Take 7.5mg  on Monday  and Friday. Take 5mg  every other day of the week. 05/13/15  Yes Larey Dresser, MD  zolpidem (AMBIEN) 5 MG tablet Take 5 mg by mouth at bedtime as needed for sleep. For insomnia.   Yes Historical Provider, MD   Physical Exam: Filed Vitals:   10/24/15 1223 10/24/15 1331 10/24/15 1519 10/24/15 1719  BP: 120/72 165/85 136/91 134/83  Pulse: 85 114 81 82  Temp: 99.9 F (37.7 C) 99 F (37.2 C) 99.2 F (37.3 C) 98.9 F (37.2 C)  TempSrc: Oral Oral Oral Oral  Resp: 20 28 22 22   Height:      Weight:      SpO2: 94% 92% 92% 92%    Wt Readings from Last 3 Encounters:  10/24/15 72.576 kg (160 lb)  09/15/15 74.027 kg (163 lb 3.2 oz)  08/29/15 75.025 kg (165 lb 6.4 oz)    General:  Appears calm and comfortable- HARD  OF HEARING Eyes: PERRL, normal lids, irises & conjunctiva ENT: grossly normal hearing, lips & tongue Neck: no LAD, masses or thyromegaly Cardiovascular: irregular Respiratory: no wheezing, prolonged expiratory phase Abdomen: soft, ntnd Skin: right elbow with redness, skin mostly intact Musculoskeletal: pain at right hip,did not want to move Psychiatric: grossly normal mood and affect, speech fluent and appropriate Neurologic: grossly non-focal.          Labs on Admission:  Basic Metabolic Panel:  Recent Labs Lab 10/24/15 0746  NA 130*  K 4.5  CL 97*  CO2 25  GLUCOSE 139*  BUN 9  CREATININE 0.90  CALCIUM 8.7*   Liver Function Tests:  Recent Labs Lab 10/24/15 0746  AST 27  ALT 29  ALKPHOS 88  BILITOT 0.5  PROT 6.6  ALBUMIN 4.1    Recent Labs Lab 10/24/15 0746  LIPASE 28   No results for input(s): AMMONIA in the last 168 hours. CBC:  Recent Labs Lab 10/24/15 0746  WBC 10.7*  NEUTROABS 8.7*  HGB 11.8*  HCT 34.3*  MCV 94.5  PLT 209   Cardiac Enzymes: No results for input(s): CKTOTAL, CKMB, CKMBINDEX, TROPONINI in the last 168 hours.  BNP (last 3 results) No results for input(s): BNP in the last 8760 hours.  ProBNP (last 3  results) No results for input(s): PROBNP in the last 8760 hours.  CBG: No results for input(s): GLUCAP in the last 168 hours.  Radiological Exams on Admission: Dg Chest 2 View  10/24/2015  CLINICAL DATA:  Shortness of breath.  Patient fell 3 days ago. EXAM: CHEST  2 VIEW COMPARISON:  CT 09/09/2015.  Radiographs 04/06/2013. FINDINGS: The heart size and mediastinal contours are stable status post median sternotomy and mitral valve replacement. Chronic volume loss and opacity inferiorly in the left hemithorax are grossly stable. There is stable mild atelectasis at the right lung base. No significant pleural effusion or pneumothorax identified. Numerous thoracic compression deformities are grossly stable. IMPRESSION: Stable postoperative chest with chronic findings in the left hemithorax. No acute findings seen. Electronically Signed   By: Richardean Sale M.D.   On: 10/24/2015 08:31   Ct Head Wo Contrast  10/24/2015  CLINICAL DATA:  The patient fell 1 week ago. The patient is on Coumadin. EXAM: CT HEAD WITHOUT CONTRAST TECHNIQUE: Contiguous axial images were obtained from the base of the skull through the vertex without intravenous contrast. COMPARISON:  CT scan dated 02/28/2014 and brain MR dated 07/14/2014 FINDINGS: There is no acute intracranial hemorrhage, acute infarction, or intracranial mass lesion. Diffuse slight atrophy. Slight periventricular white matter small vessel ischemic changes. Tiny old lacunar infarct in the head of the left caudate nucleus. Since the prior brain MR the patient is has developed extensive mucosal changes in the ethmoid and maxillary sinuses with air-fluid levels in both maxillary sinuses. There is also slight mucosal thickening in the base of the frontal sinus. IMPRESSION: No acute abnormality of the brain. Chronic small vessel ischemic changes. Possible acute on chronic sinusitis. Electronically Signed   By: Lorriane Shire M.D.   On: 10/24/2015 11:05   Ct Angio Pelvis  W/cm &/or Wo/cm  10/24/2015  CLINICAL DATA:  Right hip pain after falling 1 week ago. Right acetabular fracture with pelvic hemorrhage on CT. Evaluate for vascular or bladder injury. EXAM: CT ANGIOGRAPHY OF PELVIS TECHNIQUE: Multidetector CT imaging of the pelvis was performed before and during bolus injection of intravenous contrast. Multiplanar CT angiographic image reconstructions including MIPs were generated to evaluate the vascular  anatomy. CONTRAST:  100 ml Omnipaque 300. COMPARISON:  Abdominal pelvic CT performed earlier same date. FINDINGS: Urinary Tract: The ureters and bladder are filled with contrast from the preceding CT. No evidence of ureteral injury. The bladder demonstrates no evidence of urothelial lesion. As before, there is hemorrhage within the pre-vascular space. No evidence of leakage of contrast from the bladder lumen. Bowel: No bowel wall thickening, distention or surrounding inflammation identified within the pelvis. No evidence of bowel or mesenteric injury. Vascular/Lymphatic: Iliac artery atherosclerosis noted. No evidence of active contrast extravasation. No adenopathy. Reproductive: Radioactive seeds are noted within the prostate gland. No acute findings. Other: Perivesical hemorrhage is again noted with hemorrhage extending into the perirectal fat. There is no significant free pelvic fluid. Small umbilical hernia containing only fat noted. Musculoskeletal: Again demonstrated is a comminuted and mildly displaced fracture involving the right acetabulum and superior pubic ramus. The right femoral head is intact. No acute left pelvic findings identified. There is an old posttraumatic deformity of the left pubic rami. The sacroiliac joints are intact. Review of the MIP images confirms the above findings. IMPRESSION: 1. No evidence of acute vascular injury or active extravasation into the pelvis. 2. Pelvic hemorrhage within the perivesical space. No significant free peritoneal fluid. No  evidence of bladder or ureteral injury. 3. Comminuted right pelvic fractures involving the acetabulum. Electronically Signed   By: Richardean Sale M.D.   On: 10/24/2015 11:23   Ct Abdomen Pelvis W Contrast  10/24/2015  CLINICAL DATA:  Right groin pain with right leg swelling, unable to ambulate since March 26th at 11 a.m. History of prostate cancer status post radiation therapy completed in December of 2016. History of left lower lobectomy for pulmonary nodules. History of right middle lobe nodule. EXAM: CT ABDOMEN AND PELVIS WITH CONTRAST TECHNIQUE: Multidetector CT imaging of the abdomen and pelvis was performed using the standard protocol following bolus administration of intravenous contrast. CONTRAST:  112mL ISOVUE-300 IOPAMIDOL (ISOVUE-300) INJECTION 61% COMPARISON:  Chest CT dated 09/09/2015, CT abdomen dated 03/01/2005. FINDINGS: Lower chest: Lung bases difficult to characterize due to patient motion artifact. Small consolidation at the right base is new, most likely atelectasis. Stable dense consolidation the left lung base is likely atelectasis. Hepatobiliary: No masses or other significant abnormality. Pancreas: No mass, inflammatory changes, or other significant abnormality. Spleen: Within normal limits in size and appearance. Adrenals/Urinary Tract: Bilateral renal cysts Stomach/Bowel: Bowel is normal in caliber. No bowel wall thickening or evidence of bowel wall inflammation seen. Appendix is normal. Vascular/Lymphatic: Scattered atherosclerotic changes throughout the normal- caliber abdominal aorta and pelvic vasculature. No acute- appearing vascular abnormality identified. Reproductive: No acute findings. Other: Hyperdense free fluid within the pelvis, likely hemorrhage, small to moderate in amount, seen most prominently along the anterior and lateral margins of the bladder suggesting extraperitoneal hemorrhage. No active hemorrhage/contrast extravasation identified. Musculoskeletal:  Displaced/comminuted fractures of the right acetabulum extending superiorly into the right iliac bone just anterior to the right sacroiliac joint space. There is involvement of the anterior and posterior acetabulum. There is medial displacement of the central acetabular fracture fragment with associated femoral head protrusio. No convincing evidence of pathologic fracture. No fracture identified within the right femoral head or right femoral neck. No additional fracture seen within the pelvis. There is an old healed fracture adjacent to the left symphysis pubis. There is a small periumbilical abdominal wall hernia which contains mesenteric fat only. Superficial soft tissues otherwise unremarkable. IMPRESSION: 1. Displaced/comminuted fractures of the right acetabulum, involving the  anterior and posterior acetabulum, with upwards extension into the right iliac bone just anterior to the right SI joint. Medial displacement of the central acetabular fracture fragment with associated femoral head protrusio. No convincing evidence of pathologic fracture. No fracture seen within the adjacent sacrum. SI joints remain normally aligned. No fracture seen within the right femoral head or right femoral neck. 2. Moderate amount of free fluid, presumed hemorrhage, within the extraperitoneal pelvis. Questionable small amount of intraperitoneal free fluid/hemorrhage immediately adjacent to the bladder. No active hemorrhage/contrast extravasation identified on this exam but would consider CT angiogram of the pelvis for more definitive exclusion of active hemorrhage and/or bladder disruption (contrast from this exam should be within the bladder on any subsequent imaging). 3. Additional chronic/incidental findings detailed above. These results and recommendations were called by telephone at the time of interpretation on 10/24/2015 at 9:30 am to Dr. Gareth Morgan , who verbally acknowledged these results. Electronically Signed   By:  Franki Cabot M.D.   On: 10/24/2015 09:43   Dg Hip Unilat With Pelvis 2-3 Views Right  10/24/2015  CLINICAL DATA:  Status post fall March 24th with persistent pain involving the right hip with inability to bear weight. EXAM: DG HIP (WITH OR WITHOUT PELVIS) 2-3V RIGHT COMPARISON:  AP view of the abdomen of March 02, 2007. FINDINGS: The bones are osteopenic. The iliac bone appears preserved. The pubic rami are intact. There is subtle cortical irregularity on 1 of the AP views involving the medial wall of the acetabulum. There is mild narrowing of the right hip joint though this is less conspicuous than the narrowing on the left. The articular surface of the acetabulum and femoral head remains smoothly rounded. The femoral neck and intertrochanteric regions are normal. IMPRESSION: Possible minimally displaced fracture of the medial wall of the acetabulum. No definite fracture observed elsewhere. There is moderate degenerative joint space loss of the right hip. Incidentally noted is moderate to severe joint space loss on the left. CT scanning of the right hip is recommended. Electronically Signed   By: David  Martinique M.D.   On: 10/24/2015 08:31      Assessment/Plan Active Problems:   ATRIAL FIBRILLATION   COPD with emphysema (HCC)   TOBACCO ABUSE, HX OF   S/P mitral valve replacement   Acetabular fracture, right, closed, initial encounter   Pain due to acetabular fracture -moderate amount of free fluid/hemmorhage-- no current bleeding, recheck CBC in AM NWB on right -PT eval PO oxycodone (IV ordered but would like to limit to when PO does not work) -miralx for bowel regimen  COPD -resume home meds  H/o mitral valve replacement/a fib -on coumadin -on BB    [place] by ER doc  Code Status: full DVT Prophylaxis: Family Communication: sister at bedside Disposition Plan:   Time spent: 34 min  [facility] Pager [phone]

## 2015-10-24 NOTE — Significant Event (Signed)
Rapid Response Event Note  Overview: Time Called: 1955 Arrival Time: 1958 Event Type: Neurologic  Initial Focused Assessment: PT had sudden respiratory changes with possible seizure activity. Staff heard him yell out. Upon my arrival respirations  were labored, patient unresponsive, PEARL but sluggish. 02 sats 100% on 3 L. . Pt has history of seizures.  Gradually patient woke and respirations became normal. Pt able to follow commands and answer questions by 2020.  Interventions:Monitored V/S, stat dilantin level and dilantin dose to be given. Ativan ordered prn.   Event Summary: Name of Physician Notified: Forrest Moron NP at 2000, came to bedside by 2010.    at    Outcome: Stayed in room and stabalized     Pricilla Riffle

## 2015-10-24 NOTE — ED Notes (Signed)
Pt transported to DG.  

## 2015-10-24 NOTE — ED Notes (Signed)
Patient was requesting pain medication.  Explained to him that Tylenol was given less than 30 minutes ago.

## 2015-10-24 NOTE — ED Provider Notes (Signed)
CSN: [id number]     Arrival date & time 10/24/15  [id number] History   First MD Initiated Contact with Patient 10/24/15 [phone]     Chief Complaint  Patient presents with  . Groin Pain     (Consider location/radiation/quality/duration/timing/severity/associated sxs/prior Treatment) HPI Comments: Right groin pain, began 3 weeks ago, was mild until last night around 11PM became worse Worse with walking Constant pain Worse with moving Also chest pain and abdominal pain "feels like a cramp". Was last night and this AM, took oxycodone.  When cough has CP and when cough has pain in RLQ.  Chronic SOB, not worsened now.  No nausea/vomiting/diaphoresis.  Had flu for 5 days last week.   No fevers since that time.  Fell Saturday night but able to ambulate after this.    Past Medical History  Diagnosis Date  . 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 Cerebrovascular disease, unspecified   . Esophageal reflux   . Generalized osteoarthrosis, unspecified site   . Hypertension   . COPD (chronic obstructive pulmonary disease) ([place]) 1999  . Lung nodules 2008, 2014    LLL nodule removed 2008 with LLL lobectomy,  RLL nodule 39mm observation  . Atrial fibrillation ([place]) 11/11/2008    Qualifier: Diagnosis of  By: Lia Foyer, MD, Jaquelyn Bitter   . CARDIOMYOPATHY 10/20/2008    Qualifier: Diagnosis of  By: Owens Shark, RN, BSN, Lauren    . ISCHEMIC COLITIS, HX OF 10/20/2008    Qualifier: Diagnosis of  By: Owens Shark, RN, BSN, Lauren    . S/P mitral valve replacement 02/01/1998    Carpentier-Edwards porcine bioprosthetic tissue valve, size 26mm Placed for acute and subacute bacterial endocarditis (group G Streptococcus) with pre-existing mitral valve prolapse   . HYPERTENSION, BENIGN 10/20/2008    Qualifier: Diagnosis of  By: Owens Shark, RN, BSN, Lauren    . Epidural hematoma ([place]) 03/01/2011    Recent fall 2012   . Severe mitral regurgitation 02/20/2013  . Iron deficiency anemia, unspecified   .  S/P mitral valve replacement with bioprosthetic valve 03/24/2013    Redo mitral valve replacement using 54mm Edwards [facility] mitral bovine bioprosthetic tissue valve performed via right mini thoracotomy  . Seizures ([place])   . Prostate cancer [facility])    Past Surgical History  Procedure Laterality Date  . Mitral valve replacement  02/01/1998    Carpentier-Edwards porcine bioprosthetic tissue valve, size 86mm, placed for complicated bacterial endocarditis  . Video assisted thoracoscopy  04/29/2007    Left VATS w/ mini thoracotomy for Left Lower Lobectomy for benign lung nodules  . Tee without cardioversion N/A 02/12/2013    Procedure: TRANSESOPHAGEAL ECHOCARDIOGRAM (TEE);  Surgeon: Fay Records, MD;  Location: [facility] ENDOSCOPY;  Service: Cardiovascular;  Laterality: N/A;  . Mitral valve replacement Right 03/24/2013    Procedure: MINIMALLY INVASIVE REDO MITRAL VALVE (MV) REPLACEMENT;  Surgeon: Rexene Alberts, MD;  Location: [place];  Service: Open Heart Surgery;  Laterality: Right;  . Minimally invasive maze procedure N/A 03/24/2013    Procedure: MINIMALLY INVASIVE MAZE PROCEDURE;  Surgeon: Rexene Alberts, MD;  Location: [place];  Service: Open Heart Surgery;  Laterality: N/A;  . Intraoperative transesophageal echocardiogram N/A 03/24/2013    Procedure: INTRAOPERATIVE TRANSESOPHAGEAL ECHOCARDIOGRAM;  Surgeon: Rexene Alberts, MD;  Location: [place];  Service: Open Heart Surgery;  Laterality: N/A;  . Prostate biopsy     Family History  Problem Relation Age of Onset  . Cancer Father  prostate cancer  . Tuberculosis Father   . Heart attack Father   . Cancer Brother     prostate cancer  . Lung disease Neg Hx    Social History  Substance Use Topics  . Smoking status: Former Smoker -- 2.00 packs/day for 45 years    Types: Cigarettes    Quit date: 07/30/2006  . Smokeless tobacco: Former Systems developer    Quit date: 07/31/2007  . Alcohol Use: No     Comment: previous alcohol use & home-made alcohol consumption     Review of Systems  Constitutional: Negative for fever.  HENT: Negative for sore throat.   Eyes: Negative for visual disturbance.  Respiratory: Positive for shortness of breath (chronic).   Cardiovascular: Positive for chest pain.  Gastrointestinal: Positive for abdominal pain. Negative for vomiting, diarrhea and constipation.  Genitourinary: Negative for difficulty urinating (no change).  Musculoskeletal: Positive for arthralgias and gait problem. Negative for back pain, neck pain and neck stiffness.  Skin: Negative for rash.  Neurological: Negative for syncope, weakness and headaches.      Allergies  Rosuvastatin  Home Medications   Prior to Admission medications   Medication Sig Start Date End Date Taking? Authorizing Provider  Cholecalciferol (VITAMIN D3) 1000 UNITS CAPS Take 1 capsule by mouth daily.    Yes Historical Provider, MD  ezetimibe (ZETIA) 10 MG tablet Take 1 tablet (10 mg total) by mouth daily. 10/30/12  Yes Hillary Bow, MD  methocarbamol (ROBAXIN) 500 MG tablet Take 500 mg by mouth 2 (two) times daily.   Yes Historical Provider, MD  metoprolol tartrate (LOPRESSOR) 25 MG tablet TAKE 1/2 TABLET BY MOUTH TWICE A DAY Patient taking differently: Take 0.5 tablet by mouth two times a day 03/25/14  Yes Larey Dresser, MD  oxyCODONE (OXY IR/ROXICODONE) 5 MG immediate release tablet Take 1 tablet by mouth every 6 (six) hours as needed for moderate pain or severe pain.    Yes Historical Provider, MD  phenytoin (DILANTIN) 100 MG ER capsule Take 1 capsule (100 mg total) by mouth daily. 10/06/15  Yes Garvin Fila, MD  ramipril (ALTACE) 5 MG capsule TAKE ONE CAPSULE BY MOUTH EVERY DAY Patient taking differently: Take 1 capsule by mouth every day 03/25/14  Yes Larey Dresser, MD  SPIRIVA HANDIHALER 18 MCG inhalation capsule INHALE THE CONTENTS OF 1 CAPSULE EVERY DAY  VIA  HANDIHALER Patient taking differently: Inhale the contents of 1 capsule each day using Handihaler 08/25/15   Yes Javier Glazier, MD  warfarin (COUMADIN) 5 MG tablet TAKE AS [place] Patient taking differently: Take 5-7.5 mg by mouth every evening. Take 7.5mg  on Monday and Friday. Take 5mg  every other day of the week. 05/13/15  Yes Larey Dresser, MD  zolpidem (AMBIEN) 5 MG tablet Take 5 mg by mouth at bedtime as needed for sleep. For insomnia.   Yes Historical Provider, MD   BP 134/83 mmHg  Pulse 82  Temp(Src) 98.9 F (37.2 C) (Oral)  Resp 22  Ht 5\' 9"  (1.753 m)  Wt 160 lb (72.576 kg)  BMI 23.62 kg/m2  SpO2 92% Physical Exam  Constitutional: He is oriented to person, place, and time. He appears well-developed and well-nourished. No distress.  HENT:  Head: Normocephalic and atraumatic.  Eyes: Conjunctivae and EOM are normal.  Neck: Normal range of motion.  Cardiovascular: Normal rate, regular rhythm, normal heart sounds and intact distal pulses.  Exam reveals no gallop and no friction rub.   No  murmur heard. Pulmonary/Chest: Effort normal and breath sounds normal. No respiratory distress. He has no wheezes. He has no rales.  Abdominal: Soft. He exhibits no distension. There is tenderness (RLQ). There is guarding (RLQ).  Musculoskeletal: He exhibits no edema.       Right hip: He exhibits decreased range of motion and bony tenderness. He exhibits no laceration.       Right ankle: He exhibits normal pulse.  Neurological: He is alert and oriented to person, place, and time.  Skin: Skin is warm and dry. He is not diaphoretic.  Nursing note and vitals reviewed.   ED Course  Procedures (including critical care time) Labs Review Labs Reviewed  CBC WITH DIFFERENTIAL/PLATELET - Abnormal; Notable for the following:    WBC 10.7 (*)    RBC 3.63 (*)    Hemoglobin 11.8 (*)    HCT 34.3 (*)    Neutro Abs 8.7 (*)    All other components within normal limits  COMPREHENSIVE METABOLIC PANEL - Abnormal; Notable for the following:    Sodium 130 (*)    Chloride 97 (*)     Glucose, Bld 139 (*)    Calcium 8.7 (*)    All other components within normal limits  PROTIME-INR - Abnormal; Notable for the following:    Prothrombin Time 34.4 (*)    INR 3.65 (*)    All other components within normal limits  LIPASE, BLOOD  I-STAT TROPOININ, ED  I-STAT TROPOININ, ED  Randolm Idol, ED    Imaging Review Dg Chest 2 View  10/24/2015  CLINICAL DATA:  Shortness of breath.  Patient fell 3 days ago. EXAM: CHEST  2 VIEW COMPARISON:  CT 09/09/2015.  Radiographs 04/06/2013. FINDINGS: The heart size and mediastinal contours are stable status post median sternotomy and mitral valve replacement. Chronic volume loss and opacity inferiorly in the left hemithorax are grossly stable. There is stable mild atelectasis at the right lung base. No significant pleural effusion or pneumothorax identified. Numerous thoracic compression deformities are grossly stable. IMPRESSION: Stable postoperative chest with chronic findings in the left hemithorax. No acute findings seen. Electronically Signed   By: Richardean Sale M.D.   On: 10/24/2015 08:31   Ct Head Wo Contrast  10/24/2015  CLINICAL DATA:  The patient fell 1 week ago. The patient is on Coumadin. EXAM: CT HEAD WITHOUT CONTRAST TECHNIQUE: Contiguous axial images were obtained from the base of the skull through the vertex without intravenous contrast. COMPARISON:  CT scan dated 02/28/2014 and brain MR dated 07/14/2014 FINDINGS: There is no acute intracranial hemorrhage, acute infarction, or intracranial mass lesion. Diffuse slight atrophy. Slight periventricular white matter small vessel ischemic changes. Tiny old lacunar infarct in the head of the left caudate nucleus. Since the prior brain MR the patient is has developed extensive mucosal changes in the ethmoid and maxillary sinuses with air-fluid levels in both maxillary sinuses. There is also slight mucosal thickening in the base of the frontal sinus. IMPRESSION: No acute abnormality of the  brain. Chronic small vessel ischemic changes. Possible acute on chronic sinusitis. Electronically Signed   By: Lorriane Shire M.D.   On: 10/24/2015 11:05   Ct Angio Pelvis W/cm &/or Wo/cm  10/24/2015  CLINICAL DATA:  Right hip pain after falling 1 week ago. Right acetabular fracture with pelvic hemorrhage on CT. Evaluate for vascular or bladder injury. EXAM: CT ANGIOGRAPHY OF PELVIS TECHNIQUE: Multidetector CT imaging of the pelvis was performed before and during bolus injection of intravenous contrast. Multiplanar CT  angiographic image reconstructions including MIPs were generated to evaluate the vascular anatomy. CONTRAST:  100 ml Omnipaque 300. COMPARISON:  Abdominal pelvic CT performed earlier same date. FINDINGS: Urinary Tract: The ureters and bladder are filled with contrast from the preceding CT. No evidence of ureteral injury. The bladder demonstrates no evidence of urothelial lesion. As before, there is hemorrhage within the pre-vascular space. No evidence of leakage of contrast from the bladder lumen. Bowel: No bowel wall thickening, distention or surrounding inflammation identified within the pelvis. No evidence of bowel or mesenteric injury. Vascular/Lymphatic: Iliac artery atherosclerosis noted. No evidence of active contrast extravasation. No adenopathy. Reproductive: Radioactive seeds are noted within the prostate gland. No acute findings. Other: Perivesical hemorrhage is again noted with hemorrhage extending into the perirectal fat. There is no significant free pelvic fluid. Small umbilical hernia containing only fat noted. Musculoskeletal: Again demonstrated is a comminuted and mildly displaced fracture involving the right acetabulum and superior pubic ramus. The right femoral head is intact. No acute left pelvic findings identified. There is an old posttraumatic deformity of the left pubic rami. The sacroiliac joints are intact. Review of the MIP images confirms the above findings. IMPRESSION:  1. No evidence of acute vascular injury or active extravasation into the pelvis. 2. Pelvic hemorrhage within the perivesical space. No significant free peritoneal fluid. No evidence of bladder or ureteral injury. 3. Comminuted right pelvic fractures involving the acetabulum. Electronically Signed   By: Richardean Sale M.D.   On: 10/24/2015 11:23   Ct Abdomen Pelvis W Contrast  10/24/2015  CLINICAL DATA:  Right groin pain with right leg swelling, unable to ambulate since March 26th at 11 a.m. History of prostate cancer status post radiation therapy completed in December of 2016. History of left lower lobectomy for pulmonary nodules. History of right middle lobe nodule. EXAM: CT ABDOMEN AND PELVIS WITH CONTRAST TECHNIQUE: Multidetector CT imaging of the abdomen and pelvis was performed using the standard protocol following bolus administration of intravenous contrast. CONTRAST:  161mL ISOVUE-300 IOPAMIDOL (ISOVUE-300) INJECTION 61% COMPARISON:  Chest CT dated 09/09/2015, CT abdomen dated 03/01/2005. FINDINGS: Lower chest: Lung bases difficult to characterize due to patient motion artifact. Small consolidation at the right base is new, most likely atelectasis. Stable dense consolidation the left lung base is likely atelectasis. Hepatobiliary: No masses or other significant abnormality. Pancreas: No mass, inflammatory changes, or other significant abnormality. Spleen: Within normal limits in size and appearance. Adrenals/Urinary Tract: Bilateral renal cysts Stomach/Bowel: Bowel is normal in caliber. No bowel wall thickening or evidence of bowel wall inflammation seen. Appendix is normal. Vascular/Lymphatic: Scattered atherosclerotic changes throughout the normal- caliber abdominal aorta and pelvic vasculature. No acute- appearing vascular abnormality identified. Reproductive: No acute findings. Other: Hyperdense free fluid within the pelvis, likely hemorrhage, small to moderate in amount, seen most prominently along  the anterior and lateral margins of the bladder suggesting extraperitoneal hemorrhage. No active hemorrhage/contrast extravasation identified. Musculoskeletal: Displaced/comminuted fractures of the right acetabulum extending superiorly into the right iliac bone just anterior to the right sacroiliac joint space. There is involvement of the anterior and posterior acetabulum. There is medial displacement of the central acetabular fracture fragment with associated femoral head protrusio. No convincing evidence of pathologic fracture. No fracture identified within the right femoral head or right femoral neck. No additional fracture seen within the pelvis. There is an old healed fracture adjacent to the left symphysis pubis. There is a small periumbilical abdominal wall hernia which contains mesenteric fat only. Superficial soft tissues otherwise  unremarkable. IMPRESSION: 1. Displaced/comminuted fractures of the right acetabulum, involving the anterior and posterior acetabulum, with upwards extension into the right iliac bone just anterior to the right SI joint. Medial displacement of the central acetabular fracture fragment with associated femoral head protrusio. No convincing evidence of pathologic fracture. No fracture seen within the adjacent sacrum. SI joints remain normally aligned. No fracture seen within the right femoral head or right femoral neck. 2. Moderate amount of free fluid, presumed hemorrhage, within the extraperitoneal pelvis. Questionable small amount of intraperitoneal free fluid/hemorrhage immediately adjacent to the bladder. No active hemorrhage/contrast extravasation identified on this exam but would consider CT angiogram of the pelvis for more definitive exclusion of active hemorrhage and/or bladder disruption (contrast from this exam should be within the bladder on any subsequent imaging). 3. Additional chronic/incidental findings detailed above. These results and recommendations were called by  telephone at the time of interpretation on 10/24/2015 at 9:30 am to Dr. Gareth Morgan , who verbally acknowledged these results. Electronically Signed   By: Franki Cabot M.D.   On: 10/24/2015 09:43   Dg Hip Unilat With Pelvis 2-3 Views Right  10/24/2015  CLINICAL DATA:  Status post fall March 24th with persistent pain involving the right hip with inability to bear weight. EXAM: DG HIP (WITH OR WITHOUT PELVIS) 2-3V RIGHT COMPARISON:  AP view of the abdomen of March 02, 2007. FINDINGS: The bones are osteopenic. The iliac bone appears preserved. The pubic rami are intact. There is subtle cortical irregularity on 1 of the AP views involving the medial wall of the acetabulum. There is mild narrowing of the right hip joint though this is less conspicuous than the narrowing on the left. The articular surface of the acetabulum and femoral head remains smoothly rounded. The femoral neck and intertrochanteric regions are normal. IMPRESSION: Possible minimally displaced fracture of the medial wall of the acetabulum. No definite fracture observed elsewhere. There is moderate degenerative joint space loss of the right hip. Incidentally noted is moderate to severe joint space loss on the left. CT scanning of the right hip is recommended. Electronically Signed   By: David  Martinique M.D.   On: 10/24/2015 08:31   I have personally reviewed and evaluated these images and lab results as part of my medical decision-making.   EKG Interpretation   Date/Time:  Monday October 24 2015 06:53:26 EDT Ventricular Rate:  95 PR Interval:    QRS Duration: 100 QT Interval:  363 QTC Calculation: 456 R Axis:   100 Text Interpretation:  Atrial fibrillation Right axis deviation RSR' in V1  or V2, probably normal variant Borderline repolarization abnormality No  significant change since last tracing Confirmed by [facility] MD, Holli Rengel  ([id number]) on 10/24/2015 7:01:34 AM      MDM   Final diagnoses:  Right groin pain  Acetabular  fracture, right, closed, initial encounter    70 year old male with a history of endocarditis necessitating mitral valve replacement in 1999 (and redo 2014), atrial fibrillation on coumadin, chronic diastolic CHF, COPD, seizure disorder on dilantin, prostate cancer presents with concern for right groin pain.  DDx includes hip fracture, septic arthritis, muscular strain, intraabdominal etiology.  No fevers, erythema or warmth and doubt septic arthritis.  XR hip shows question of right acetabular fx.   Patient also with significant RLQ tenderness on exam. CT abdomen pelvis shows comminuted right acetabular fracture with surrounding hemorrhage. CT angiogram done given one BP reading was decreased, which showed no signs of active extravasation.  Patient hemodynamically  stable following this.  He also mentions CP, present with coughing. CXR shows no acute findings. EKG shows atrial fibrillation unchanged from prior.  Troponin x3 WNL.  Pain with coughing and have low suspicion for cardiac etiology.  Coumadin slightly supratherapeutic  Patient with mild waxing/waning mental status, likely delirium secondary to pain medications. CT head WNL.  No infectious symptoms.  Discussed with Dr. Lorin Mercy, Orthopedics, who recommends patient remain nonweightbearing with a walker. Discussed this case management, we're able to obtain a walker at bedside, however when we attempted to ambulate patient after having oral medications, he was unable to do so.  Given patient lives alone, has significant pain which is not controlled by oral medications, and is unable to ambulate nonweightbearing with walker, will admit for pain control and physical therapy.  Dr. Lorin Mercy will follow patient however no operative plan at this time.       Gareth Morgan, MD 10/24/15 2121

## 2015-10-24 NOTE — Care Management Obs Status (Signed)
[place] NOTIFICATION   Patient Details  Name: Jeffery Copeland MRN: [id number] Date of Birth: 11/04/1945   Medicare Observation Status Notification Given:  Yes (intractable pain, pelvic fracture)  Sister assisted with signing form  Robbie Lis, RN 10/24/2015, 5:40 PM

## 2015-10-24 NOTE — ED Notes (Signed)
Pt BIB EMS from home. C/o R inguinal pain causing him to not be able to ambulate. EMS reported R leg swollen. Hx COPD, afib and pneumonia.   Vitals: 129/65, 72

## 2015-10-24 NOTE — Progress Notes (Signed)
70 yr male with [place] access coverage living in [place] in an apartment complex independently until recent fall CM consult in [employer] for DME- noted in chart review pt listed with only a sister for support system, living in an apt, unable to find that pt has been assisted with Home health set up in last year by a [facility] staff and pt noted to have a Displaced/comminuted fractures of the right acetabulum, involving the anterior and posterior acetabulum, with upwards extension into the right iliac bone just anterior to the right SI joint per imaging  ED CM went to assess pt but he is in CT per his son Jeffery Copeland at bedside Cm reviewed home health for DME and home services, senior resources and senior transportation. Cm left copies of these resources on the bedside table. [name] on cell phone with someone when Cm entered but concluded his call and informed CM his father had fallen he believes on "saturday" and did not let him know until he arrived to see pt here in [facility] ED.  Jeffery Copeland reports pt is "stubborn"  Reports he has been informed pt needs a "rolling walker" and aware that pt does not have DME at home Jeffery Copeland reports pt's sister reported she "may be able to get a walker from her church"  Jeffery Copeland provided his cell number for Cm to enter in [facility] as [phone]

## 2015-10-24 NOTE — ED Notes (Signed)
17:45 PT CAN GO UP.

## 2015-10-24 NOTE — Progress Notes (Addendum)
ANTICOAGULATION CONSULT NOTE - Initial Consult  Pharmacy Consult for warfarin Indication: atrial fibrillation  Allergies  Allergen Reactions  . Rosuvastatin Other (See Comments)    Muscle aches    Patient Measurements: Height: 5\' 9"  (175.3 cm) Weight: 157 lb 6.4 oz (71.396 kg) IBW/kg (Calculated) : 70.7  Vital Signs: Temp: 98 F (36.7 C) (03/27 1812) Temp Source: Oral (03/27 1812) BP: 128/87 mmHg (03/27 1812) Pulse Rate: 102 (03/27 1812)  Labs:  Recent Labs  10/24/15 0746  HGB 11.8*  HCT 34.3*  PLT 209  LABPROT 34.4*  INR 3.65*  CREATININE 0.90    Estimated Creatinine Clearance: 77.5 mL/min (by C-G formula based on Cr of 0.9).   Medical History: Past Medical History  Diagnosis Date  . 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 Cerebrovascular disease, unspecified   . Esophageal reflux   . Generalized osteoarthrosis, unspecified site   . Hypertension   . COPD (chronic obstructive pulmonary disease) ([place]) 1999  . Lung nodules 2008, 2014    LLL nodule removed 2008 with LLL lobectomy,  RLL nodule 73mm observation  . Atrial fibrillation ([place]) 11/11/2008    Qualifier: Diagnosis of  By: Lia Foyer, MD, Jaquelyn Bitter   . CARDIOMYOPATHY 10/20/2008    Qualifier: Diagnosis of  By: Owens Shark, RN, BSN, Lauren    . ISCHEMIC COLITIS, HX OF 10/20/2008    Qualifier: Diagnosis of  By: Owens Shark, RN, BSN, Lauren    . S/P mitral valve replacement 02/01/1998    Carpentier-Edwards porcine bioprosthetic tissue valve, size 61mm Placed for acute and subacute bacterial endocarditis (group G Streptococcus) with pre-existing mitral valve prolapse   . HYPERTENSION, BENIGN 10/20/2008    Qualifier: Diagnosis of  By: Owens Shark, RN, BSN, Lauren    . Epidural hematoma ([place]) 03/01/2011    Recent fall 2012   . Severe mitral regurgitation 02/20/2013  . Iron deficiency anemia, unspecified   . S/P mitral valve replacement with bioprosthetic valve 03/24/2013    Redo mitral valve  replacement using 34mm [facility] mitral bovine bioprosthetic tissue valve performed via right mini thoracotomy  . Seizures ([place])   . Prostate cancer ([employer])     Medications:  Prescriptions prior to admission  Medication Sig Dispense Refill Last Dose  . Cholecalciferol (VITAMIN D3) 1000 UNITS CAPS Take 1 capsule by mouth daily.    10/23/2015 at Unknown time  . ezetimibe (ZETIA) 10 MG tablet Take 1 tablet (10 mg total) by mouth daily. 30 tablet 6 10/23/2015 at Unknown time  . methocarbamol (ROBAXIN) 500 MG tablet Take 500 mg by mouth 2 (two) times daily.   10/23/2015 at Unknown time  . metoprolol tartrate (LOPRESSOR) 25 MG tablet TAKE 1/2 TABLET BY MOUTH TWICE A DAY (Patient taking differently: Take 0.5 tablet by mouth two times a day) 30 tablet 4 10/23/2015 at 2000  . oxyCODONE (OXY IR/ROXICODONE) 5 MG immediate release tablet Take 1 tablet by mouth every 6 (six) hours as needed for moderate pain or severe pain.    10/23/2015 at 2000  . phenytoin (DILANTIN) 100 MG ER capsule Take 1 capsule (100 mg total) by mouth daily. 270 capsule 3 10/23/2015 at Unknown time  . ramipril (ALTACE) 5 MG capsule TAKE ONE CAPSULE BY MOUTH EVERY DAY (Patient taking differently: Take 1 capsule by mouth every day) 30 capsule 4 10/23/2015 at 2000  . SPIRIVA HANDIHALER 18 MCG inhalation capsule INHALE THE CONTENTS OF 1 CAPSULE EVERY DAY  VIA  HANDIHALER (Patient taking differently:  Inhale the contents of 1 capsule each day using Handihaler) 90 capsule 0 10/23/2015 at Unknown time  . warfarin (COUMADIN) 5 MG tablet TAKE AS DIRECTED BY ANTICOAGULATION CLINIC (Patient taking differently: Take 5-7.5 mg by mouth every evening. Take 7.5mg  on Monday and Friday. Take 5mg  every other day of the week.) 100 tablet 1 10/23/2015 at 2000  . zolpidem (AMBIEN) 5 MG tablet Take 5 mg by mouth at bedtime as needed for sleep. For insomnia.   Past Week at Unknown time    Assessment: 83 yoM admitted 3/27 after fall with fracture of R acetabulum.  On  warfarin PTA for Afib; to continue per pharmacy while inpatient.  PMH Bioprosthetic MVR placed 2014, COPD, GERD, HTN, OA.   Baseline INR elevated on admission  Prior anticoagulation: warfarin 5 mg daily except 7.5 mg Mon and Fri; LD 3/26   Today, 10/24/2015:  CBC: hgb sl low; Plt wnl  INR supratherapeutic  Major drug interactions: phenytoin (on prior to admit)  No bleeding issues per nursing; moderate hemorrhage in fracture site, but no continued bleeding  Ordered heart-healthy diet  Goal of Therapy: INR 2-3  Plan:  Hold warfarin tonight  Daily INR  CBC at least q72 hr while on warfarin  Monitor for signs of bleeding or thrombosis   Reuel Boom, PharmD Pager: [phone] 10/24/2015, 6:21 PM

## 2015-10-24 NOTE — ED Notes (Signed)
Pt states same right hip pain 3 weeks ago but subsided with continued walking; fall resulting in abrasion to right elbow on Friday; denies other injury; new onset right hip pain this morning.

## 2015-10-25 DIAGNOSIS — Z954 Presence of other heart-valve replacement: Secondary | ICD-10-CM | POA: Diagnosis not present

## 2015-10-25 DIAGNOSIS — S32421A Displaced fracture of posterior wall of right acetabulum, initial encounter for closed fracture: Secondary | ICD-10-CM | POA: Diagnosis not present

## 2015-10-25 DIAGNOSIS — S32511A Fracture of superior rim of right pubis, initial encounter for closed fracture: Secondary | ICD-10-CM | POA: Diagnosis not present

## 2015-10-25 DIAGNOSIS — I4891 Unspecified atrial fibrillation: Secondary | ICD-10-CM | POA: Diagnosis not present

## 2015-10-25 DIAGNOSIS — J438 Other emphysema: Secondary | ICD-10-CM | POA: Diagnosis not present

## 2015-10-25 DIAGNOSIS — S32401A Unspecified fracture of right acetabulum, initial encounter for closed fracture: Secondary | ICD-10-CM | POA: Diagnosis not present

## 2015-10-25 LAB — BASIC METABOLIC PANEL
Anion gap: 7 (ref 5–15)
BUN: 7 mg/dL (ref 6–20)
CALCIUM: 8.3 mg/dL — AB (ref 8.9–10.3)
CO2: 25 mmol/L (ref 22–32)
CREATININE: 0.84 mg/dL (ref 0.61–1.24)
Chloride: 98 mmol/L — ABNORMAL LOW (ref 101–111)
GFR calc non Af Amer: >60 mL/min (ref >60)
Glucose, Bld: 128 mg/dL — ABNORMAL HIGH (ref 65–99)
Potassium: 4.5 mmol/L (ref 3.5–5.1)
Sodium: 130 mmol/L — ABNORMAL LOW (ref 135–145)

## 2015-10-25 LAB — CBC
HCT: 29.5 % — ABNORMAL LOW (ref 39.0–52.0)
Hemoglobin: 10.3 g/dL — ABNORMAL LOW (ref 13.0–17.0)
MCH: 33 pg (ref 26.0–34.0)
MCHC: 34.9 g/dL (ref 30.0–36.0)
MCV: 94.6 fL (ref 78.0–100.0)
PLATELETS: 209 10*3/uL (ref 150–400)
RBC: 3.12 MIL/uL — AB (ref 4.22–5.81)
RDW: 12.8 % (ref 11.5–15.5)
WBC: 10.2 10*3/uL (ref 4.0–10.5)

## 2015-10-25 LAB — PROTIME-INR
INR: 4.09 — ABNORMAL HIGH (ref 0.00–1.49)
Prothrombin Time: 37.5 seconds — ABNORMAL HIGH (ref 11.6–15.2)

## 2015-10-25 MED ORDER — METOPROLOL TARTRATE 25 MG PO TABS
12.5000 mg | ORAL_TABLET | Freq: Two times a day (BID) | ORAL | Status: DC
  Administered 2015-10-25 – 2015-10-26 (×2): 12.5 mg via ORAL
  Filled 2015-10-25 (×3): qty 1

## 2015-10-25 NOTE — Care Management Note (Signed)
Case Management Note  Patient Details  Name: Jeffery Copeland MRN: [id number] Date of Birth: 1946/01/18  Subjective/Objective:  Per attending likely need SNF. PT cons in will await recc.CSW notified.                  Action/Plan:d/c plan SNF.   Expected Discharge Date:   (unknown)               Expected Discharge Plan:  Skilled Nursing Facility  In-House Referral:  Clinical Social Work  Discharge planning Services  CM Consult  Post Acute Care Choice:    Choice offered to:     DME Arranged:    DME Agency:     HH Arranged:    [place] Agency:     Status of Service:  In process, will continue to follow  Medicare Important Message Given:    Date Medicare IM Given:    Medicare IM give by:    Date Additional Medicare IM Given:    Additional Medicare Important Message give by:     If discussed at [place] of Stay Meetings, dates discussed:    Additional Comments:  Dessa Phi, RN 10/25/2015, 10:23 AM

## 2015-10-25 NOTE — NC FL2 (Signed)
[place] LEVEL OF CARE SCREENING TOOL     IDENTIFICATION  Patient Name: Jeffery Copeland Birthdate: 06/30/1946 Sex: male Admission Date (Current Location): 10/24/2015  [facility] and [place] Number:  Herbalist and Address:  [facility],  [address] [place], [place]      Provider Number: [id number]  Attending Physician Name and Address:  Geradine Girt, DO  Relative Name and Phone Number:       Current Level of Care: Hospital Recommended Level of Care: [place] Prior Approval Number:    Date Approved/Denied:   PASRR Number: [id number]  Discharge Plan: SNF    Current Diagnoses: Patient Active Problem List   Diagnosis Date Noted  . Acetabular fracture, right, closed, initial encounter 10/24/2015  . Malignant neoplasm of prostate ([place]) 03/14/2015  . Ventral hernia without obstruction or gangrene 03/09/2014  . Umbilical hernia [id number]  . Encounter for therapeutic drug monitoring 08/20/2013  . Chronic diastolic CHF (congestive heart failure) ([place]) 07/31/2013  . 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 Pulmonary nodule, right 09/29/2012  . Dyspnea 09/26/2012  . Epidural hematoma ([place]) 03/01/2011  . Long term (current) use of anticoagulants 10/19/2010  . CAROTID ARTERY STENOSIS 05/26/2009  . ATRIAL FIBRILLATION 11/11/2008  . ATRIAL FLUTTER 11/11/2008  . ANEMIA, IRON DEFICIENCY 10/20/2008  . HYPERTENSION, BENIGN 10/20/2008  . CARDIOMYOPATHY 10/20/2008  . CEREBROVASCULAR DISEASE 10/20/2008  . 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 GERD 10/20/2008  . DEGENERATIVE JOINT DISEASE, GENERALIZED 10/20/2008  . ATRIAL FIBRILLATION, CHRONIC, HX OF 10/20/2008  . ISCHEMIC COLITIS, HX OF 10/20/2008  . MITRAL VALVE REPLACEMENT, HX OF 10/20/2008  . TOBACCO ABUSE, HX OF 10/20/2008  . S/P mitral valve replacement 02/01/1998    Orientation RESPIRATION BLADDER Height & Weight      Self, Time, Place, Situation  O2 (3L) Continent Weight: 157 lb 6.4 oz (71.396 kg) Height:  5\' 9"  (175.3 cm)  BEHAVIORAL SYMPTOMS/MOOD NEUROLOGICAL BOWEL NUTRITION STATUS      Continent Diet (Regular)  AMBULATORY STATUS COMMUNICATION OF NEEDS Skin   Extensive Assist Verbally Normal                       Personal Care Assistance Level of Assistance  Bathing, Dressing Bathing Assistance: Limited assistance   Dressing Assistance: Limited assistance     Functional Limitations Info             SPECIAL CARE FACTORS FREQUENCY  PT (By licensed PT), OT (By licensed OT)     PT Frequency: 5 OT Frequency: 5            Contractures      Additional Factors Info  Code Status, Allergies Code Status Info: Fullcode Allergies Info: Rosuvastatin           Current Medications (10/25/2015):  This is the current hospital active medication list Current Facility-Administered Medications  Medication Dose Route Frequency Provider Last Rate Last Dose  . acetaminophen (TYLENOL) tablet 650 mg  650 mg Oral Q6H PRN Geradine Girt, DO       Or  . acetaminophen (TYLENOL) suppository 650 mg  650 mg Rectal Q6H PRN Geradine Girt, DO      . cholecalciferol (VITAMIN D) tablet 1,000 Units  1,000 Units Oral Daily Geradine Girt, DO   1,000 Units at 10/25/15 0916  . ezetimibe (ZETIA) tablet 10 mg  10 mg Oral Daily Geradine Girt,  DO   10 mg at 10/25/15 0915  . HYDROmorphone (DILAUDID) injection 0.5 mg  0.5 mg Intravenous Q4H PRN Geradine Girt, DO      . LORazepam (ATIVAN) injection 1 mg  1 mg Intravenous Q4H PRN Gardiner Barefoot, NP      . methocarbamol (ROBAXIN) tablet 500 mg  500 mg Oral BID Geradine Girt, DO   500 mg at 10/25/15 0915  . metoprolol tartrate (LOPRESSOR) tablet 12.5 mg  12.5 mg Oral BID Geradine Girt, DO      . ondansetron [facility]) tablet 4 mg  4 mg Oral Q6H PRN Geradine Girt, DO       Or  . ondansetron (ZOFRAN) injection 4 mg  4 mg Intravenous Q6H PRN Geradine Girt, DO      . oxyCODONE (Oxy IR/ROXICODONE) immediate release tablet 5-10 mg  5-10 mg Oral Q4H PRN Geradine Girt, DO   10 mg at 10/25/15 1151  . phenytoin (DILANTIN) ER capsule 300 mg  300 mg Oral QHS Gardiner Barefoot, NP   300 mg at 10/24/15 2207  . polyethylene glycol (MIRALAX / GLYCOLAX) packet 17 g  17 g Oral Daily PRN Geradine Girt, DO      . ramipril (ALTACE) capsule 5 mg  5 mg Oral Daily Jessica U Vann, DO   5 mg at 10/25/15 1151  . tiotropium (SPIRIVA) inhalation capsule 18 mcg  18 mcg Inhalation Daily Geradine Girt, DO      . Warfarin - Pharmacist Dosing Inpatient   Does not apply [place], [place] at 10/24/15 1844     Discharge Medications: Please see discharge summary for a list of discharge medications.  Relevant Imaging Results:  Relevant Lab Results:   Additional Information SSN: [id number]  Standley Brooking, LCSW

## 2015-10-25 NOTE — Progress Notes (Signed)
Occupational Therapy Evaluation Patient Details Name: Jeffery Copeland MRN: [id number] DOB: [birth year] Today's Date: 10/25/2015    History of Present Illness  70 yo male adm through [facility] ER with c/o pain in his groin that have left him unable to ambulate. He states Saturday he went to the grocery store and as he was walking out the cart tipped over and he tripped over the back wheel.xray= R acetabular fx       PMHx of COPD, GERD, HTN and osteoarthritis     Clinical Impression   Patient presents to OT with decreased ADL independence and safety due to the diagnosis above and the below functional limitations. He will benefit from skilled OT to maximize function and to facilitate a safe discharge. OT will follow.     Follow Up Recommendations  SNF;Supervision/Assistance - 24 hour    Equipment Recommendations  Other (comment) (tbd at next venue of care)    Recommendations for Other Services       Precautions / Restrictions Precautions Precautions: Fall Precaution Comments: also seizure precautions Restrictions Weight Bearing Restrictions: Yes RLE Weight Bearing: Non weight bearing      Mobility Bed Mobility Overal bed mobility: Needs Assistance Bed Mobility: Supine to Sit     Supine to sit: Min assist;+2 for physical assistance;+2 for safety/equipment     General bed mobility comments: assist with RLE, bed pad used to scoot laterally, multi-modal cues for technique, self assist  Transfers Overall transfer level: Needs assistance Equipment used: Rolling walker (2 wheeled) Transfers: Sit to/from [facility] Sit to Stand: +2 physical assistance;Min assist Stand pivot transfers: Min assist;+2 physical assistance;+2 safety/equipment       General transfer comment: assist for anterior-superior wt shift, cues for technique, sequencing and NWB RLE    Balance                                       ADL Overall ADL's : Needs  assistance/impaired Eating/Feeding: Independent;Bed level;Sitting   Grooming: Wash/dry hands;Wash/dry face;Set up   Upper Body Bathing: Set up;Sitting   Lower Body Bathing: Maximal assistance;Sit to/from stand           Toilet Transfer: Minimal assistance;+2 for physical assistance;+2 for safety/equipment;Stand-pivot;BSC;RW           Functional mobility during ADLs: Minimal assistance;+2 for physical assistance;+2 for safety/equipment;Rolling walker General ADL Comments: Patient had difficulty sitting on R hip/pelvis at EOB. Educated on NWB status RLE and patient was able to maintain NWB with RW to get bed to chair with assistance, but then was very fatigued.     Vision     Perception     Praxis      Pertinent Vitals/Pain Pain Assessment: 0-10 Pain Score: 8  Pain Location: R hip/groin/pelvic area Pain Descriptors / Indicators: Shooting Pain Intervention(s): Limited activity within patient's tolerance;Monitored during session;Repositioned     Hand Dominance Right   Extremity/Trunk Assessment Upper Extremity Assessment Upper Extremity Assessment: Overall WFL for tasks assessed   Lower Extremity Assessment Lower Extremity Assessment: Defer to PT evaluation        Communication Communication Communication: HOH   Cognition Arousal/Alertness: Awake/alert Behavior During Therapy: WFL for tasks assessed/performed Overall Cognitive Status: Within Functional Limits for tasks assessed                     General Comments       Exercises  Shoulder Instructions      Home Living Family/patient expects to be discharged to:: Unsure Living Arrangements: Alone                                      Prior Functioning/Environment Level of Independence: Independent        Comments: drives, does own meal prep, cleans house, laundry    OT Diagnosis: Acute pain   OT Problem List: Decreased strength;Decreased activity  tolerance;Impaired balance (sitting and/or standing);Decreased safety awareness;Decreased knowledge of use of DME or AE;Decreased knowledge of precautions;Pain   OT Treatment/Interventions: Self-care/ADL training;DME and/or AE instruction;Therapeutic activities;Patient/family education    OT Goals(Current goals can be found in the care plan section) Acute Rehab OT Goals Patient Stated Goal: less pain OT Goal Formulation: With patient Time For Goal Achievement: 11/08/15 Potential to Achieve Goals: Good ADL Goals Pt Will Perform Lower Body Bathing: with min assist;with adaptive equipment;sit to/from stand Pt Will Perform Lower Body Dressing: with min assist;with adaptive equipment;sit to/from stand Pt Will Transfer to Toilet: with supervision;stand pivot transfer;bedside commode Pt Will Perform Toileting - Clothing Manipulation and hygiene: with min assist;sit to/from stand  OT Frequency: Min 2X/week   Barriers to D/C: Decreased caregiver support  lives alone       Co-evaluation PT/OT/SLP Co-Evaluation/Treatment: Yes Reason for Co-Treatment: For patient/therapist safety PT goals addressed during session: Mobility/safety with mobility OT goals addressed during session: ADL's and self-care      End of Session Equipment Utilized During Treatment: Gait belt;Rolling walker Nurse Communication: Mobility status  Activity Tolerance: Patient tolerated treatment well Patient left: in chair;with call bell/phone within reach;with chair alarm set   Time: [phone] OT Time Calculation (min): 20 min Charges:  OT General Charges $OT Visit: 1 Procedure OT Evaluation $OT Eval Moderate Complexity: 1 Procedure G-Codes: OT G-codes **NOT FOR INPATIENT CLASS** Functional Assessment Tool Used: clinical judgment Functional Limitation: Self care Self Care Current Status [id number]): At least 60 percent but less than 80 percent impaired, limited or restricted Self Care Goal Status [id number]): At least 1  percent but less than 20 percent impaired, limited or restricted  Jeffery Copeland A 10/25/2015, 12:41 PM

## 2015-10-25 NOTE — Progress Notes (Signed)
Patient states unable to void. Feeling lots of pressure. Bladder scan showed >200cc in bladder. Text paged Dr Eliseo Squires. Order obtained to catheterize patient. Catheter to be left in as indwelling if large amount of urine obtained or patient has continued difficulty voiding.  Greater than 600cc clear yellow urine obtained upon insertion and slight resistance met during advancement so foley left in place as per order. Eulas Post, RN

## 2015-10-25 NOTE — Consult Note (Signed)
Reason for Consult:fall at [employer] and right acetabular Fx Referring Physician:Jessica  Eliseo Squires MD  Jeffery Copeland is an 70 y.o. male.  HPI: 70 yo lives alone , fell at grocery store Saturday. Was able to get up ambulated home  But over the next 2 days had increased right groin and lateral hip pain and by Monday states he could barely walk.  xrays acetabular Fx with small hematoma, he is on coumadin.    Hard of hearing , no hearing aids. Hx of prostate cancer , old L2 Fx ( no change from 2012 CT scan ). No pathologic changes in pelvic to suggest bone pelvis involvement. On percocet for pain. COPD, redo MVR.  S/p Maze procedure.   Past Medical History  Diagnosis Date  . 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 Cerebrovascular disease, unspecified   . Esophageal reflux   . Generalized osteoarthrosis, unspecified site   . Hypertension   . COPD (chronic obstructive pulmonary disease) ([place]) 1999  . Lung nodules 2008, 2014    LLL nodule removed 2008 with LLL lobectomy,  RLL nodule 6m observation  . Atrial fibrillation ([place] 11/11/2008    Qualifier: Diagnosis of  By: SLia Foyer MD, FJaquelyn Bitter  . CARDIOMYOPATHY 10/20/2008    Qualifier: Diagnosis of  By: BOwens Shark RN, BSN, Lauren    . ISCHEMIC COLITIS, HX OF 10/20/2008    Qualifier: Diagnosis of  By: BOwens Shark RN, BSN, Lauren    . 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 HYPERTENSION, BENIGN 10/20/2008    Qualifier: Diagnosis of  By: BrOwens SharkRN, BSN, Lauren    . Epidural hematoma ([place]8/08/2010    Recent fall 2012   . Severe mitral regurgitation 02/20/2013  . Iron deficiency anemia, unspecified   . S/P mitral valve replacement with bioprosthetic valve 03/24/2013    Redo mitral valve replacement using 2919mdwards [facility] bovine  bioprosthetic tissue valve performed via right mini thoracotomy  . Seizures ([facility] . [facility]   Past Surgical History  Procedure Laterality Date  . Mitral valve replacement  02/01/1998    Carpentier-Edwards porcine bioprosthetic tissue valve, size 72m49mlaced for complicated bacterial endocarditis  . Video assisted thoracoscopy  04/29/2007    Left VATS w/ mini thoracotomy for Left Lower Lobectomy for benign lung nodules  . Tee without cardioversion N/A 02/12/2013    Procedure: TRANSESOPHAGEAL ECHOCARDIOGRAM (TEE);  Surgeon: PaulFay Records;  [facility];  Service: Cardiovascular;  Laterality: N/A;  . Mitral valve replacement Right 03/24/2013    Procedure: MINIMALLY INVASIVE REDO MITRAL VALVE (MV) REPLACEMENT;  Surgeon: ClarRexene Alberts;  [facility] [place] [facility];  Laterality: Right;  . Minimally invasive maze procedure N/A 03/24/2013    Procedure: MINIMALLY INVASIVE MAZE PROCEDURE;  Surgeon: ClarRexene Alberts;  [facility] [place] [facility];  Laterality: N/A;  . Intraoperative transesophageal echocardiogram N/A 03/24/2013    Procedure: INTRAOPERATIVE TRANSESOPHAGEAL ECHOCARDIOGRAM;  Surgeon: ClarRexene Alberts;  [facility] [place];  Laterality: N/A;  . Prostate biopsy      Family History  Problem Relation Age of Onset  . Cancer Father     prostate cancer  . Tuberculosis Father   . Heart  attack Father   . Cancer Brother     prostate cancer  . Lung disease Neg Hx     Social History:  reports that he quit smoking about 9 years ago. His smoking use included Cigarettes. He has a 90 pack-year smoking history. He quit smokeless tobacco use about 8 years ago. He reports that he does not drink alcohol or use illicit drugs.  Allergies:  Allergies  Allergen Reactions  . Rosuvastatin Other (See Comments)    Muscle aches    Medications: I have reviewed the patient's current  medications.  Results for orders placed or performed during the hospital encounter of 10/24/15 (from the past 48 hour(s))  CBC with Differential     Status: Abnormal   Collection Time: 10/24/15  7:46 AM  Result Value Ref Range   WBC 10.7 (H) 4.0 - 10.5 K/uL   RBC 3.63 (L) 4.22 - 5.81 MIL/uL   Hemoglobin 11.8 (L) 13.0 - 17.0 g/dL   HCT 34.3 (L) 39.0 - 52.0 %   MCV 94.5 78.0 - 100.0 fL   MCH 32.5 26.0 - 34.0 pg   MCHC 34.4 30.0 - 36.0 g/dL   RDW 12.5 11.5 - 15.5 %   Platelets 209 150 - 400 K/uL   Neutrophils Relative % 82 %   Neutro Abs 8.7 (H) 1.7 - 7.7 K/uL   Lymphocytes Relative 10 %   Lymphs Abs 1.1 0.7 - 4.0 K/uL   Monocytes Relative 8 %   Monocytes Absolute 0.9 0.1 - 1.0 K/uL   Eosinophils Relative 0 %   Eosinophils Absolute 0.0 0.0 - 0.7 K/uL   Basophils Relative 0 %   Basophils Absolute 0.0 0.0 - 0.1 K/uL  Comprehensive metabolic panel     Status: Abnormal   Collection Time: 10/24/15  7:46 AM  Result Value Ref Range   Sodium 130 (L) 135 - 145 mmol/L   Potassium 4.5 3.5 - 5.1 mmol/L   Chloride 97 (L) 101 - 111 mmol/L   CO2 25 22 - 32 mmol/L   Glucose, Bld 139 (H) 65 - 99 mg/dL   BUN 9 6 - 20 mg/dL   Creatinine, Ser 0.90 0.61 - 1.24 mg/dL   Calcium 8.7 (L) 8.9 - 10.3 mg/dL   Total Protein 6.6 6.5 - 8.1 g/dL   Albumin 4.1 3.5 - 5.0 g/dL   AST 27 15 - 41 U/L   ALT 29 17 - 63 U/L   Alkaline Phosphatase 88 38 - 126 U/L   Total Bilirubin 0.5 0.3 - 1.2 mg/dL   GFR calc non Af Amer >60 >60 mL/min   GFR calc Af Amer >60 >60 mL/min    Comment: (NOTE) The eGFR has been calculated using the CKD EPI equation. This calculation has not been validated in all clinical situations. eGFR's persistently <60 mL/min signify possible Chronic Kidney Disease.    Anion gap 8 5 - 15  Lipase, blood     Status: None   Collection Time: 10/24/15  7:46 AM  Result Value Ref Range   Lipase 28 11 - 51 U/L  Protime-INR     Status: Abnormal   Collection Time: 10/24/15  7:46 AM  Result Value  Ref Range   Prothrombin Time 34.4 (H) 11.6 - 15.2 seconds   INR 3.65 (H) 0.00 - 1.49  I-Stat Troponin, ED - 0, 3, 6 hours (not at [facility])     Status: None   Collection Time: 10/24/15  7:59 AM  Result Value Ref Range  Troponin i, poc 0.01 0.00 - 0.08 ng/mL   Comment 3            Comment: Due to the release kinetics of cTnI, a negative result within the first hours of the onset of symptoms does not rule out myocardial infarction with certainty. If myocardial infarction is still suspected, repeat the test at appropriate intervals.   I-Stat Troponin, ED - 0, 3, 6 hours (not at [facility])     Status: None   Collection Time: 10/24/15 11:28 AM  Result Value Ref Range   Troponin i, poc 0.01 0.00 - 0.08 ng/mL   Comment 3            Comment: Due to the release kinetics of cTnI, a negative result within the first hours of the onset of symptoms does not rule out myocardial infarction with certainty. If myocardial infarction is still suspected, repeat the test at appropriate intervals.   I-Stat Troponin, ED - 0, 3, 6 hours (not at [facility])     Status: None   Collection Time: 10/24/15  2:12 PM  Result Value Ref Range   Troponin i, poc 0.00 0.00 - 0.08 ng/mL   Comment 3            Comment: Due to the release kinetics of cTnI, a negative result within the first hours of the onset of symptoms does not rule out myocardial infarction with certainty. If myocardial infarction is still suspected, repeat the test at appropriate intervals.   Phenytoin level, total     Status: Abnormal   Collection Time: 10/24/15  8:17 PM  Result Value Ref Range   Phenytoin Lvl <2.5 (L) 10.0 - 20.0 ug/mL  Basic metabolic panel     Status: Abnormal   Collection Time: 10/25/15  5:14 AM  Result Value Ref Range   Sodium 130 (L) 135 - 145 mmol/L   Potassium 4.5 3.5 - 5.1 mmol/L   Chloride 98 (L) 101 - 111 mmol/L   CO2 25 22 - 32 mmol/L   Glucose, Bld 128 (H) 65 - 99 mg/dL   BUN 7 6 - 20 mg/dL   Creatinine, Ser 0.84 0.61 -  1.24 mg/dL   Calcium 8.3 (L) 8.9 - 10.3 mg/dL   GFR calc non Af Amer >60 >60 mL/min   GFR calc Af Amer >60 >60 mL/min    Comment: (NOTE) The eGFR has been calculated using the CKD EPI equation. This calculation has not been validated in all clinical situations. eGFR's persistently <60 mL/min signify possible Chronic Kidney Disease.    Anion gap 7 5 - 15  CBC     Status: Abnormal   Collection Time: 10/25/15  5:14 AM  Result Value Ref Range   WBC 10.2 4.0 - 10.5 K/uL   RBC 3.12 (L) 4.22 - 5.81 MIL/uL   Hemoglobin 10.3 (L) 13.0 - 17.0 g/dL   HCT 29.5 (L) 39.0 - 52.0 %   MCV 94.6 78.0 - 100.0 fL   MCH 33.0 26.0 - 34.0 pg   MCHC 34.9 30.0 - 36.0 g/dL   RDW 12.8 11.5 - 15.5 %   Platelets 209 150 - 400 K/uL  Protime-INR     Status: Abnormal   Collection Time: 10/25/15  5:14 AM  Result Value Ref Range   Prothrombin Time 37.5 (H) 11.6 - 15.2 seconds   INR 4.09 (H) 0.00 - 1.49    Dg Chest 2 View  10/24/2015  CLINICAL DATA:  Shortness of breath.  Patient fell  3 days ago. EXAM: CHEST  2 VIEW COMPARISON:  CT 09/09/2015.  Radiographs 04/06/2013. FINDINGS: The heart size and mediastinal contours are stable status post median sternotomy and mitral valve replacement. Chronic volume loss and opacity inferiorly in the left hemithorax are grossly stable. There is stable mild atelectasis at the right lung base. No significant pleural effusion or pneumothorax identified. Numerous thoracic compression deformities are grossly stable. IMPRESSION: Stable postoperative chest with chronic findings in the left hemithorax. No acute findings seen. Electronically Signed   By: Richardean Sale M.D.   On: 10/24/2015 08:31   Ct Head Wo Contrast  10/24/2015  CLINICAL DATA:  The patient fell 1 week ago. The patient is on Coumadin. EXAM: CT HEAD WITHOUT CONTRAST TECHNIQUE: Contiguous axial images were obtained from the base of the skull through the vertex without intravenous contrast. COMPARISON:  CT scan dated  02/28/2014 and brain MR dated 07/14/2014 FINDINGS: There is no acute intracranial hemorrhage, acute infarction, or intracranial mass lesion. Diffuse slight atrophy. Slight periventricular white matter small vessel ischemic changes. Tiny old lacunar infarct in the head of the left caudate nucleus. Since the prior brain MR the patient is has developed extensive mucosal changes in the ethmoid and maxillary sinuses with air-fluid levels in both maxillary sinuses. There is also slight mucosal thickening in the base of the frontal sinus. IMPRESSION: No acute abnormality of the brain. Chronic small vessel ischemic changes. Possible acute on chronic sinusitis. Electronically Signed   By: Lorriane Shire M.D.   On: 10/24/2015 11:05   Ct Angio Pelvis W/cm &/or Wo/cm  10/24/2015  CLINICAL DATA:  Right hip pain after falling 1 week ago. Right acetabular fracture with pelvic hemorrhage on CT. Evaluate for vascular or bladder injury. EXAM: CT ANGIOGRAPHY OF PELVIS TECHNIQUE: Multidetector CT imaging of the pelvis was performed before and during bolus injection of intravenous contrast. Multiplanar CT angiographic image reconstructions including MIPs were generated to evaluate the vascular anatomy. CONTRAST:  100 ml Omnipaque 300. COMPARISON:  Abdominal pelvic CT performed earlier same date. FINDINGS: Urinary Tract: The ureters and bladder are filled with contrast from the preceding CT. No evidence of ureteral injury. The bladder demonstrates no evidence of urothelial lesion. As before, there is hemorrhage within the pre-vascular space. No evidence of leakage of contrast from the bladder lumen. Bowel: No bowel wall thickening, distention or surrounding inflammation identified within the pelvis. No evidence of bowel or mesenteric injury. Vascular/Lymphatic: Iliac artery atherosclerosis noted. No evidence of active contrast extravasation. No adenopathy. Reproductive: Radioactive seeds are noted within the prostate gland. No acute  findings. Other: Perivesical hemorrhage is again noted with hemorrhage extending into the perirectal fat. There is no significant free pelvic fluid. Small umbilical hernia containing only fat noted. Musculoskeletal: Again demonstrated is a comminuted and mildly displaced fracture involving the right acetabulum and superior pubic ramus. The right femoral head is intact. No acute left pelvic findings identified. There is an old posttraumatic deformity of the left pubic rami. The sacroiliac joints are intact. Review of the MIP images confirms the above findings. IMPRESSION: 1. No evidence of acute vascular injury or active extravasation into the pelvis. 2. Pelvic hemorrhage within the perivesical space. No significant free peritoneal fluid. No evidence of bladder or ureteral injury. 3. Comminuted right pelvic fractures involving the acetabulum. Electronically Signed   By: Richardean Sale M.D.   On: 10/24/2015 11:23   Ct Abdomen Pelvis W Contrast  10/24/2015  CLINICAL DATA:  Right groin pain with right leg swelling, unable  to ambulate since March 26th at 11 a.m. History of prostate cancer status post radiation therapy completed in December of 2016. History of left lower lobectomy for pulmonary nodules. History of right middle lobe nodule. EXAM: CT ABDOMEN AND PELVIS WITH CONTRAST TECHNIQUE: Multidetector CT imaging of the abdomen and pelvis was performed using the standard protocol following bolus administration of intravenous contrast. CONTRAST:  180m ISOVUE-300 IOPAMIDOL (ISOVUE-300) INJECTION 61% COMPARISON:  Chest CT dated 09/09/2015, CT abdomen dated 03/01/2005. FINDINGS: Lower chest: Lung bases difficult to characterize due to patient motion artifact. Small consolidation at the right base is new, most likely atelectasis. Stable dense consolidation the left lung base is likely atelectasis. Hepatobiliary: No masses or other significant abnormality. Pancreas: No mass, inflammatory changes, or other significant  abnormality. Spleen: Within normal limits in size and appearance. Adrenals/Urinary Tract: Bilateral renal cysts Stomach/Bowel: Bowel is normal in caliber. No bowel wall thickening or evidence of bowel wall inflammation seen. Appendix is normal. Vascular/Lymphatic: Scattered atherosclerotic changes throughout the normal- caliber abdominal aorta and pelvic vasculature. No acute- appearing vascular abnormality identified. Reproductive: No acute findings. Other: Hyperdense free fluid within the pelvis, likely hemorrhage, small to moderate in amount, seen most prominently along the anterior and lateral margins of the bladder suggesting extraperitoneal hemorrhage. No active hemorrhage/contrast extravasation identified. Musculoskeletal: Displaced/comminuted fractures of the right acetabulum extending superiorly into the right iliac bone just anterior to the right sacroiliac joint space. There is involvement of the anterior and posterior acetabulum. There is medial displacement of the central acetabular fracture fragment with associated femoral head protrusio. No convincing evidence of pathologic fracture. No fracture identified within the right femoral head or right femoral neck. No additional fracture seen within the pelvis. There is an old healed fracture adjacent to the left symphysis pubis. There is a small periumbilical abdominal wall hernia which contains mesenteric fat only. Superficial soft tissues otherwise unremarkable. IMPRESSION: 1. Displaced/comminuted fractures of the right acetabulum, involving the anterior and posterior acetabulum, with upwards extension into the right iliac bone just anterior to the right SI joint. Medial displacement of the central acetabular fracture fragment with associated femoral head protrusio. No convincing evidence of pathologic fracture. No fracture seen within the adjacent sacrum. SI joints remain normally aligned. No fracture seen within the right femoral head or right femoral  neck. 2. Moderate amount of free fluid, presumed hemorrhage, within the extraperitoneal pelvis. Questionable small amount of intraperitoneal free fluid/hemorrhage immediately adjacent to the bladder. No active hemorrhage/contrast extravasation identified on this exam but would consider CT angiogram of the pelvis for more definitive exclusion of active hemorrhage and/or bladder disruption (contrast from this exam should be within the bladder on any subsequent imaging). 3. Additional chronic/incidental findings detailed above. These results and recommendations were called by telephone at the time of interpretation on 10/24/2015 at 9:30 am to Dr. EGareth Morgan, who verbally acknowledged these results. Electronically Signed   By: SFranki CabotM.D.   On: 10/24/2015 09:43   Dg Hip Unilat With Pelvis 2-3 Views Right  10/24/2015  CLINICAL DATA:  Status post fall March 24th with persistent pain involving the right hip with inability to bear weight. EXAM: DG HIP (WITH OR WITHOUT PELVIS) 2-3V RIGHT COMPARISON:  AP view of the abdomen of March 02, 2007. FINDINGS: The bones are osteopenic. The iliac bone appears preserved. The pubic rami are intact. There is subtle cortical irregularity on 1 of the AP views involving the medial wall of the acetabulum. There is mild narrowing of the right  hip joint though this is less conspicuous than the narrowing on the left. The articular surface of the acetabulum and femoral head remains smoothly rounded. The femoral neck and intertrochanteric regions are normal. IMPRESSION: Possible minimally displaced fracture of the medial wall of the acetabulum. No definite fracture observed elsewhere. There is moderate degenerative joint space loss of the right hip. Incidentally noted is moderate to severe joint space loss on the left. CT scanning of the right hip is recommended. Electronically Signed   By: David  Martinique M.D.   On: 10/24/2015 08:31    Review of Systems  Constitutional: Negative  for fever, chills, weight loss and diaphoresis.  HENT: Positive for hearing loss.   Eyes: Negative for double vision.  Respiratory: Positive for cough, shortness of breath and wheezing.   Cardiovascular: Negative for palpitations.  Gastrointestinal: Positive for heartburn.  Genitourinary: Negative for hematuria.  Musculoskeletal: Positive for falls.  Neurological: Negative for headaches.  Endo/Heme/Allergies: Bruises/bleeds easily.       On coumadin chronically  Psychiatric/Behavioral: Negative for hallucinations.   Blood pressure 102/56, pulse 76, temperature 98.4 F (36.9 C), temperature source Oral, resp. rate 22, height '5\' 9"'$  (1.753 m), weight 71.396 kg (157 lb 6.4 oz), SpO2 99 %. Physical Exam  Constitutional: He is oriented to person, place, and time. He appears well-developed.  HENT:  Decreased hearing, slightly better in right ear.   Eyes: Conjunctivae are normal. Pupils are equal, round, and reactive to light.  Neck: Normal range of motion. Neck supple.  Cardiovascular: Normal rate.   Respiratory: Effort normal. He has wheezes.  GI: Soft.  Musculoskeletal:  Pain with right hip ROM.   Neurological: He is alert and oriented to person, place, and time.  Skin: Skin is warm and dry. No rash noted. No erythema.    Assessment/Plan: Right comminuted acetabular fx with weight bearing joint in satisfactory position. Will discuss with Dr. Winifred Olive Trauma specialist. Will proceed with non -op Tx unless he feels surgery needed.  Will need NWB right LE with transfers, walker.  Likely short term SNF since he lives alone.    [phone]  Annete Ayuso C 10/25/2015, 7:36 AM   Will follow with you.  Hematoma in pelvis as expected for fx with coumadin and ambulation. Slight drop in H and H. Continue to follow.

## 2015-10-25 NOTE — Progress Notes (Signed)
Patient ID: Jeffery Copeland, male   DOB: 07-27-1946, 70 y.o.   MRN: [id number] Dr. Marcelino Scot reviewed films and agrees with plan for conservative Tx.  NWB times 8 wks. With walker. Once Fx heals he should be able to WB again progressing from walker. For now he is NWB for 8 wks. Since he lives alone SNF is likely discharge plan.    My phone [phone]

## 2015-10-25 NOTE — Clinical Social Work Note (Signed)
Clinical Social Work Assessment  Patient Details  Name: Jeffery Copeland MRN: [id number] Date of Birth: October 10, 1945  Date of referral:  10/25/15               Reason for consult:  Facility Placement                Permission sought to share information with:  Facility Sport and exercise psychologist, Family Supports Permission granted to share information::  Yes, Verbal Permission Granted  Name::     Marcia Brash  Agency::  [facility] SNF search  Relationship::  Sister  Contact Information:     Housing/Transportation Living arrangements for the past 2 months:  Single Family Home Source of Information:  Patient Patient Interpreter Needed:  None Criminal Activity/Legal Involvement Pertinent to Current Situation/Hospitalization:  No - Comment as needed Significant Relationships:  Siblings Lives with:  Self Do you feel safe going back to the place where you live?  Yes Need for family participation in patient care:  No (Coment)  Care giving concerns:  Pt admitted from home alone. PT recommending short-term rehab at a SNF.   Social Worker assessment / plan:  CSW received PT recommendation for short-term rehab. CSW received word from [facility], Juliann Pulse that pt and pt family are interested in rehab. BSW Intern met with pt and pt sister, Altha Harm at bedside. Pt states he lives at home alone. Pt agreeable to [facility] search. Pt and pt sister not aware of any facilities.   CSW completed FL2 and faxed pt information via Epic hub.  BSW Intern to proved bed offers.  CSW continuing to follow.   Employment status:  Retired Nurse, adult PT Recommendations:  [place] / Referral to community resources:  [place]  Patient/Family's Response to care:  Pt alert and oriented x4. Pt sister discussed care giving concerns with pt if he were to go home. Pt sister does no feel she can care for pt at home at this time.    Patient/Family's Understanding of and Emotional Response to Diagnosis, Current Treatment, and Prognosis:  Pt and pt sister report no further questions at this time.  Emotional Assessment Appearance:  Appears stated age Attitude/Demeanor/Rapport:  Other (Appropriate) Affect (typically observed):  Accepting, Calm Orientation:  Oriented to Self, Oriented to Place, Oriented to  Time, Oriented to Situation Alcohol / Substance use:  Not Applicable Psych involvement (Current and /or in the community):  No (Comment)  Discharge Needs  Concerns to be addressed:  Care Coordination Readmission within the last 30 days:  No Current discharge risk:  None Barriers to Discharge:  Continued Medical Work up   [employer], Student-SW 10/25/2015, 2:08 PM

## 2015-10-25 NOTE — Progress Notes (Signed)
ANTICOAGULATION CONSULT NOTE - Initial Consult  Pharmacy Consult for warfarin Indication: atrial fibrillation  Allergies  Allergen Reactions  . Rosuvastatin Other (See Comments)    Muscle aches    Patient Measurements: Height: 5\' 9"  (175.3 cm) Weight: 157 lb 6.4 oz (71.396 kg) IBW/kg (Calculated) : 70.7  Vital Signs: Temp: 98.4 F (36.9 C) (03/28 0515) Temp Source: Oral (03/28 0515) BP: 102/56 mmHg (03/28 0515) Pulse Rate: 76 (03/28 0515)  Labs:  Recent Labs  10/24/15 0746 10/25/15 0514  HGB 11.8* 10.3*  HCT 34.3* 29.5*  PLT 209 209  LABPROT 34.4* 37.5*  INR 3.65* 4.09*  CREATININE 0.90 0.84    Estimated Creatinine Clearance: 83 mL/min (by C-G formula based on Cr of 0.84).   Medical History: Past Medical History  Diagnosis Date  . 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 Cerebrovascular disease, unspecified   . Esophageal reflux   . Generalized osteoarthrosis, unspecified site   . Hypertension   . COPD (chronic obstructive pulmonary disease) ([place]) 1999  . Lung nodules 2008, 2014    LLL nodule removed 2008 with LLL lobectomy,  RLL nodule 59mm observation  . Atrial fibrillation ([place]) 11/11/2008    Qualifier: Diagnosis of  By: Lia Foyer, MD, Jaquelyn Bitter   . CARDIOMYOPATHY 10/20/2008    Qualifier: Diagnosis of  By: Owens Shark, RN, BSN, Lauren    . ISCHEMIC COLITIS, HX OF 10/20/2008    Qualifier: Diagnosis of  By: Owens Shark, RN, BSN, Lauren    . S/P mitral valve replacement 02/01/1998    C[facility]entier-Edwards porcine bioprosthetic tissue valve, size 41mm Placed for acute and subacute bacterial endocarditis (group G Streptococcus) with pre-existing mitral valve prolapse   . HYPERTENSION, BENIGN 10/20/2008    Qualifier: Diagnosis of  By: Owens Shark, RN, BSN, Lauren    . Epidural hematoma ([place]) 03/01/2011    Recent fall 2012   . Severe mitral regurgitation 02/20/2013  . Iron deficiency anemia, unspecified   . S/P mitral valve replacement with  bioprosthetic valve 03/24/2013    Redo mitral valve replacement using 59mm [facility] mitral bovine bioprosthetic tissue valve performed via right mini thoracotomy  . Seizures ([place])   . Prostate cancer ([place])     Medications:  Prescriptions prior to admission  Medication Sig Dispense Refill Last Dose  . Cholecalciferol (VITAMIN D3) 1000 UNITS CAPS Take 1 capsule by mouth daily.    10/23/2015 at Unknown time  . ezetimibe (ZETIA) 10 MG tablet Take 1 tablet (10 mg total) by mouth daily. 30 tablet 6 10/23/2015 at Unknown time  . methocarbamol (ROBAXIN) 500 MG tablet Take 500 mg by mouth 2 (two) times daily.   10/23/2015 at Unknown time  . metoprolol tartrate (LOPRESSOR) 25 MG tablet TAKE 1/2 TABLET BY MOUTH TWICE A DAY (Patient taking differently: Take 0.5 tablet by mouth two times a day) 30 tablet 4 10/23/2015 at 2000  . oxyCODONE (OXY IR/ROXICODONE) 5 MG immediate release tablet Take 1 tablet by mouth every 6 (six) hours as needed for moderate pain or severe pain.    10/23/2015 at 2000  . phenytoin (DILANTIN) 100 MG ER capsule Take 1 capsule (100 mg total) by mouth daily. 270 capsule 3 10/23/2015 at Unknown time  . ramipril (ALTACE) 5 MG capsule TAKE ONE CAPSULE BY MOUTH EVERY DAY (Patient taking differently: Take 1 capsule by mouth every day) 30 capsule 4 10/23/2015 at 2000  . SPIRIVA HANDIHALER 18 MCG inhalation capsule INHALE THE CONTENTS OF 1 CAPSULE EVERY  DAY  VIA  HANDIHALER (Patient taking differently: Inhale the contents of 1 capsule each day using Handihaler) 90 capsule 0 10/23/2015 at Unknown time  . warfarin (COUMADIN) 5 MG tablet TAKE AS DIRECTED BY ANTICOAGULATION CLINIC (Patient taking differently: Take 5-7.5 mg by mouth every evening. Take 7.5mg  on Monday and Friday. Take 5mg  every other day of the week.) 100 tablet 1 10/23/2015 at 2000  . zolpidem (AMBIEN) 5 MG tablet Take 5 mg by mouth at bedtime as needed for sleep. For insomnia.   Past Week at Unknown time    Assessment: 67 yoM  admitted 3/27 after fall with fracture of R acetabulum.  On warfarin PTA for Afib; to continue per pharmacy while inpatient.  PMH Bioprosthetic MVR placed 2014, COPD, GERD, HTN, OA.   Baseline INR elevated on admission  Prior anticoagulation: warfarin 5 mg daily except 7.5 mg Mon and Fri; LD 3/26   Today, 10/25/2015:  CBC: hgb sl low, small drop today; Plt wnl  INR supratherapeutic, today increased to 4/09  Major drug interactions: phenytoin (on prior to admit), However, pt given load of fosphenytoin and dose increased to phenytoin 300 mg QHS. Level was <2.5. It is expected that phenytoin dose increase will initially will cause INR increase, but over time ( about 2 weeks) will cause INR levels to decrease.   No bleeding issues per nursing; moderate hemorrhage in fracture site, but no continued bleeding  Ordered heart-healthy diet  Goal of Therapy: INR 2-3  Plan:  Hold warfarin tonight  Daily INR  CBC at least q72 hr while on warfarin  Monitor for signs of bleeding or thrombosis   Royetta Asal, PharmD, BCPS Pager [phone] 10/25/2015 8:17 AM

## 2015-10-25 NOTE — Progress Notes (Signed)
[facility]  PROGRESS NOTE  Jeffery Copeland [id number] DOB: October 26, 1945 DOA: 10/24/2015 PCP: Maximino Greenland, MD  Assessment/Plan: Pain due to acetabular fracture -moderate amount of free fluid/hemmorhage-- no current bleeding- CBC in AM -monitor NWB on right -PT eval-SNF PO oxycodone (IV ordered but would like to limit to when PO does not work) -miralx for bowel regimen -ortho consult- Dr. Lorin Mercy following  COPD -resume home meds  H/o mitral valve replacement/a fib -on coumadin -on BB  Seizures -had an episode after coming from ER when he did not get his medications -patient says he has been taking -loading dose and then resume home 300mg  daily  Code Status: full Family Communication: sister- no answer Disposition Plan:    Consultants:  ortho  Procedures:      HPI/Subjective: No recollection of seizure last PM Says he has been taking his meds  Objective: Filed Vitals:   10/24/15 2027 10/25/15 0515  BP: 142/74 102/56  Pulse:  76  Temp:  98.4 F (36.9 C)  Resp: 23 22    Intake/Output Summary (Last 24 hours) at 10/25/15 1122 Last data filed at 10/25/15 0859  Gross per 24 hour  Intake      0 ml  Output    525 ml  Net   -525 ml   Filed Weights   10/24/15 0642 10/24/15 1812  Weight: 72.576 kg (160 lb) 71.396 kg (157 lb 6.4 oz)    Exam:   General:  Awake, mild pain (just finished working with PT)  Cardiovascular: rrr  Respiratory: prolonged expiratory phase, no wheezing  Abdomen: +BS, soft  Musculoskeletal: no edema   Data Reviewed: Basic Metabolic Panel:  Recent Labs Lab 10/24/15 0746 10/25/15 0514  NA 130* 130*  K 4.5 4.5  CL 97* 98*  CO2 25 25  GLUCOSE 139* 128*  BUN 9 7  CREATININE 0.90 0.84  CALCIUM 8.7* 8.3*   Liver Function Tests:  Recent Labs Lab 10/24/15 0746  AST 27  ALT 29  ALKPHOS 88  BILITOT 0.5  PROT 6.6  ALBUMIN 4.1    Recent Labs Lab 10/24/15 0746  LIPASE 28   No results for input(s): AMMONIA in the last  168 hours. CBC:  Recent Labs Lab 10/24/15 0746 10/25/15 0514  WBC 10.7* 10.2  NEUTROABS 8.7*  --   HGB 11.8* 10.3*  HCT 34.3* 29.5*  MCV 94.5 94.6  PLT 209 209   Cardiac Enzymes: No results for input(s): CKTOTAL, CKMB, CKMBINDEX, TROPONINI in the last 168 hours. BNP (last 3 results) No results for input(s): BNP in the last 8760 hours.  ProBNP (last 3 results) No results for input(s): PROBNP in the last 8760 hours.  CBG: No results for input(s): GLUCAP in the last 168 hours.  No results found for this or any previous visit (from the past 240 hour(s)).   Studies: Dg Chest 2 View  10/24/2015  CLINICAL DATA:  Shortness of breath.  Patient fell 3 days ago. EXAM: CHEST  2 VIEW COMPARISON:  CT 09/09/2015.  Radiographs 04/06/2013. FINDINGS: The heart size and mediastinal contours are stable status post median sternotomy and mitral valve replacement. Chronic volume loss and opacity inferiorly in the left hemithorax are grossly stable. There is stable mild atelectasis at the right lung base. No significant pleural effusion or pneumothorax identified. Numerous thoracic compression deformities are grossly stable. IMPRESSION: Stable postoperative chest with chronic findings in the left hemithorax. No acute findings seen. Electronically Signed   By: Richardean Sale M.D.   On:  10/24/2015 08:31   Ct Head Wo Contrast  10/24/2015  CLINICAL DATA:  The patient fell 1 week ago. The patient is on Coumadin. EXAM: CT HEAD WITHOUT CONTRAST TECHNIQUE: Contiguous axial images were obtained from the base of the skull through the vertex without intravenous contrast. COMPARISON:  CT scan dated 02/28/2014 and brain MR dated 07/14/2014 FINDINGS: There is no acute intracranial hemorrhage, acute infarction, or intracranial mass lesion. Diffuse slight atrophy. Slight periventricular white matter small vessel ischemic changes. Tiny old lacunar infarct in the head of the left caudate nucleus. Since the prior brain MR the  patient is has developed extensive mucosal changes in the ethmoid and maxillary sinuses with air-fluid levels in both maxillary sinuses. There is also slight mucosal thickening in the base of the frontal sinus. IMPRESSION: No acute abnormality of the brain. Chronic small vessel ischemic changes. Possible acute on chronic sinusitis. Electronically Signed   By: Lorriane Shire M.D.   On: 10/24/2015 11:05   Ct Angio Pelvis W/cm &/or Wo/cm  10/24/2015  CLINICAL DATA:  Right hip pain after falling 1 week ago. Right acetabular fracture with pelvic hemorrhage on CT. Evaluate for vascular or bladder injury. EXAM: CT ANGIOGRAPHY OF PELVIS TECHNIQUE: Multidetector CT imaging of the pelvis was performed before and during bolus injection of intravenous contrast. Multiplanar CT angiographic image reconstructions including MIPs were generated to evaluate the vascular anatomy. CONTRAST:  100 ml Omnipaque 300. COMPARISON:  Abdominal pelvic CT performed earlier same date. FINDINGS: Urinary Tract: The ureters and bladder are filled with contrast from the preceding CT. No evidence of ureteral injury. The bladder demonstrates no evidence of urothelial lesion. As before, there is hemorrhage within the pre-vascular space. No evidence of leakage of contrast from the bladder lumen. Bowel: No bowel wall thickening, distention or surrounding inflammation identified within the pelvis. No evidence of bowel or mesenteric injury. Vascular/Lymphatic: Iliac artery atherosclerosis noted. No evidence of active contrast extravasation. No adenopathy. Reproductive: Radioactive seeds are noted within the prostate gland. No acute findings. Other: Perivesical hemorrhage is again noted with hemorrhage extending into the perirectal fat. There is no significant free pelvic fluid. Small umbilical hernia containing only fat noted. Musculoskeletal: Again demonstrated is a comminuted and mildly displaced fracture involving the right acetabulum and superior  pubic ramus. The right femoral head is intact. No acute left pelvic findings identified. There is an old posttraumatic deformity of the left pubic rami. The sacroiliac joints are intact. Review of the MIP images confirms the above findings. IMPRESSION: 1. No evidence of acute vascular injury or active extravasation into the pelvis. 2. Pelvic hemorrhage within the perivesical space. No significant free peritoneal fluid. No evidence of bladder or ureteral injury. 3. Comminuted right pelvic fractures involving the acetabulum. Electronically Signed   By: Richardean Sale M.D.   On: 10/24/2015 11:23   Ct Abdomen Pelvis W Contrast  10/24/2015  CLINICAL DATA:  Right groin pain with right leg swelling, unable to ambulate since March 26th at 11 a.m. History of prostate cancer status post radiation therapy completed in December of 2016. History of left lower lobectomy for pulmonary nodules. History of right middle lobe nodule. EXAM: CT ABDOMEN AND PELVIS WITH CONTRAST TECHNIQUE: Multidetector CT imaging of the abdomen and pelvis was performed using the standard protocol following bolus administration of intravenous contrast. CONTRAST:  154mL ISOVUE-300 IOPAMIDOL (ISOVUE-300) INJECTION 61% COMPARISON:  Chest CT dated 09/09/2015, CT abdomen dated 03/01/2005. FINDINGS: Lower chest: Lung bases difficult to characterize due to patient motion artifact. Small consolidation  at the right base is new, most likely atelectasis. Stable dense consolidation the left lung base is likely atelectasis. Hepatobiliary: No masses or other significant abnormality. Pancreas: No mass, inflammatory changes, or other significant abnormality. Spleen: Within normal limits in size and appearance. Adrenals/Urinary Tract: Bilateral renal cysts Stomach/Bowel: Bowel is normal in caliber. No bowel wall thickening or evidence of bowel wall inflammation seen. Appendix is normal. Vascular/Lymphatic: Scattered atherosclerotic changes throughout the normal-  caliber abdominal aorta and pelvic vasculature. No acute- appearing vascular abnormality identified. Reproductive: No acute findings. Other: Hyperdense free fluid within the pelvis, likely hemorrhage, small to moderate in amount, seen most prominently along the anterior and lateral margins of the bladder suggesting extraperitoneal hemorrhage. No active hemorrhage/contrast extravasation identified. Musculoskeletal: Displaced/comminuted fractures of the right acetabulum extending superiorly into the right iliac bone just anterior to the right sacroiliac joint space. There is involvement of the anterior and posterior acetabulum. There is medial displacement of the central acetabular fracture fragment with associated femoral head protrusio. No convincing evidence of pathologic fracture. No fracture identified within the right femoral head or right femoral neck. No additional fracture seen within the pelvis. There is an old healed fracture adjacent to the left symphysis pubis. There is a small periumbilical abdominal wall hernia which contains mesenteric fat only. Superficial soft tissues otherwise unremarkable. IMPRESSION: 1. Displaced/comminuted fractures of the right acetabulum, involving the anterior and posterior acetabulum, with upwards extension into the right iliac bone just anterior to the right SI joint. Medial displacement of the central acetabular fracture fragment with associated femoral head protrusio. No convincing evidence of pathologic fracture. No fracture seen within the adjacent sacrum. SI joints remain normally aligned. No fracture seen within the right femoral head or right femoral neck. 2. Moderate amount of free fluid, presumed hemorrhage, within the extraperitoneal pelvis. Questionable small amount of intraperitoneal free fluid/hemorrhage immediately adjacent to the bladder. No active hemorrhage/contrast extravasation identified on this exam but would consider CT angiogram of the pelvis for more  definitive exclusion of active hemorrhage and/or bladder disruption (contrast from this exam should be within the bladder on any subsequent imaging). 3. Additional chronic/incidental findings detailed above. These results and recommendations were called by telephone at the time of interpretation on 10/24/2015 at 9:30 am to Dr. Gareth Morgan , who verbally acknowledged these results. Electronically Signed   By: Franki Cabot M.D.   On: 10/24/2015 09:43   Dg Hip Unilat With Pelvis 2-3 Views Right  10/24/2015  CLINICAL DATA:  Status post fall March 24th with persistent pain involving the right hip with inability to bear weight. EXAM: DG HIP (WITH OR WITHOUT PELVIS) 2-3V RIGHT COMPARISON:  AP view of the abdomen of March 02, 2007. FINDINGS: The bones are osteopenic. The iliac bone appears preserved. The pubic rami are intact. There is subtle cortical irregularity on 1 of the AP views involving the medial wall of the acetabulum. There is mild narrowing of the right hip joint though this is less conspicuous than the narrowing on the left. The articular surface of the acetabulum and femoral head remains smoothly rounded. The femoral neck and intertrochanteric regions are normal. IMPRESSION: Possible minimally displaced fracture of the medial wall of the acetabulum. No definite fracture observed elsewhere. There is moderate degenerative joint space loss of the right hip. Incidentally noted is moderate to severe joint space loss on the left. CT scanning of the right hip is recommended. Electronically Signed   By: David  Martinique M.D.   On: 10/24/2015 08:31  Scheduled Meds: . cholecalciferol  1,000 Units Oral Daily  . ezetimibe  10 mg Oral Daily  . methocarbamol  500 mg Oral BID  . metoprolol tartrate  12.5 mg Oral BID  . phenytoin  300 mg Oral QHS  . ramipril  5 mg Oral Daily  . tiotropium  18 mcg Inhalation Daily  . Warfarin - Pharmacist Dosing Inpatient   Does not apply q1800   Continuous Infusions:    Antibiotics Given (last 72 hours)    None      Active Problems:   ATRIAL FIBRILLATION   COPD with emphysema (HCC)   TOBACCO ABUSE, HX OF   S/P mitral valve replacement   Acetabular fracture, right, closed, initial encounter    Time spent: 25 min    [facility] Pager [phone] If 7PM-7AM, please contact night-coverage at [email or web address], password [facility] 10/25/2015, 11:22 AM

## 2015-10-25 NOTE — Clinical Social Work Placement (Signed)
   CLINICAL SOCIAL WORK PLACEMENT  NOTE  Date:  10/25/2015  Patient Details  Name: Jeffery Copeland MRN: [id number] Date of Birth: 1946-07-05  Clinical Social Work is seeking post-discharge placement for this patient at the [place] level of care (*CSW will initial, date and re-position this form in  chart as items are completed):  Yes   Patient/family provided with [facility] Work Department's list of facilities offering this level of care within the geographic area requested by the patient (or if unable, by the patient's family).  Yes   Patient/family informed of their freedom to choose among providers that offer the needed level of care, that participate in Medicare, Medicaid or managed care program needed by the patient, have an available bed and are willing to accept the patient.  Yes   Patient/family informed of [facility]'s ownership interest in [facility] and [facility], as well as of the fact that they are under no obligation to receive care at these facilities.  PASRR submitted to EDS on 10/25/15     PASRR number received on 10/25/15     Existing PASRR number confirmed on       FL2 transmitted to all facilities in geographic area requested by pt/family on 10/25/15     FL2 transmitted to all facilities within larger geographic area on 10/25/15     Patient informed that his/her managed care company has contracts with or will negotiate with certain facilities, including the following:        Yes   Patient/family informed of bed offers received.  Patient chooses bed at       Physician recommends and patient chooses bed at      Patient to be transferred to   on  .  Patient to be transferred to facility by       Patient family notified on   of transfer.  Name of family member notified:        PHYSICIAN       Additional Comment:    _______________________________________________ Harlon Flor, Student-SW 10/25/2015, 2:08  PM

## 2015-10-25 NOTE — Evaluation (Signed)
Physical Therapy Evaluation Patient Details Name: Jeffery Copeland MRN: [id number] DOB: [birth year] Today's Date: 10/25/2015   History of Present Illness   70 yo male adm through [facility] ER with c/o pain in his groin that have left him unable to ambulate. He states Saturday he went to the grocery store and as he was walking out the cart tipped over and he tripped over the back wheel.xray= R acetabular fx       PMHx of COPD, GERD, HTN and osteoarthritis    Clinical Impression  Pt admitted with above diagnosis. Pt currently with functional limitations due to the deficits listed below (see PT Problem List).  Pt will benefit from skilled PT to increase their independence and safety with mobility to allow discharge to the venue listed below.   Pt HR 90-91, fatigues easily, will need SNF     Follow Up Recommendations SNF    Equipment Recommendations  None recommended by PT    Recommendations for Other Services       Precautions / Restrictions Precautions Precautions: Fall Precaution Comments: also seizure precautions Restrictions Weight Bearing Restrictions: Yes RLE Weight Bearing: Non weight bearing      Mobility  Bed Mobility Overal bed mobility: Needs Assistance Bed Mobility: Supine to Sit     Supine to sit: Min assist;+2 for physical assistance;+2 for safety/equipment     General bed mobility comments: assist with RLE, bed pad used to scoot laterally, multi-modal cues for technique, self assist  Transfers Overall transfer level: Needs assistance Equipment used: Rolling walker (2 wheeled) Transfers: Sit to/from [employer] Transfers Sit to Stand: +2 physical assistance;Min assist         General transfer comment: assist for anterior-superior wt shift, cues for technique, sequencing and NWB RLE  Ambulation/Gait Ambulation/Gait assistance: Min assist;+2 physical assistance;+2 safety/equipment Ambulation Distance (Feet): 4 Feet (pivotal steps to chair) Assistive device:  Rolling walker (2 wheeled)       General Gait Details: multi-modal cues for technique, sequence, NWB-- pt is able to maintain NWB   Stairs            Wheelchair Mobility    Modified Rankin (Stroke Patients Only)       Balance Overall balance assessment: Needs assistance;History of Falls   Sitting balance-Leahy Scale: Fair Sitting balance - Comments: pt is very painful in sitting, laterally shifted to L d/t pain with WBing on R hip in sitting position     Standing balance-Leahy Scale: Poor Standing balance comment: reliant on UEs                             Pertinent Vitals/Pain Pain Assessment: 0-10 Pain Score: 8  Pain Location: R hip/groin Pain Descriptors / Indicators: Shooting    Home Living Family/patient expects to be discharged to:: Unsure Living Arrangements: Alone                    Prior Function Level of Independence: Independent         Comments: drives, does own meal prep, cleans house, laundry     Hand Dominance   Dominant Hand: Right    Extremity/Trunk Assessment   Upper Extremity Assessment: Defer to OT evaluation;Overall WFL for tasks assessed           Lower Extremity Assessment: RLE deficits/detail;LLE deficits/detail   LLE Deficits / Details: 3 to 3+/5, limited by pain RLE with LLE testing     Communication  Communication: HOH  Cognition Arousal/Alertness: Awake/alert Behavior During Therapy: WFL for tasks assessed/performed Overall Cognitive Status: Within Functional Limits for tasks assessed                      General Comments      Exercises        Assessment/Plan    PT Assessment    PT Diagnosis Generalized weakness;Difficulty walking   PT Problem List    PT Treatment Interventions     PT Goals (Current goals can be found in the Care Plan section) Acute Rehab PT Goals Patient Stated Goal: less pain PT Goal Formulation: With patient Time For Goal Achievement:  11/01/15 Potential to Achieve Goals: Good    Frequency     Barriers to discharge        Co-evaluation PT/OT/SLP Co-Evaluation/Treatment: Yes Reason for Co-Treatment: For patient/therapist safety PT goals addressed during session: Mobility/safety with mobility         End of Session Equipment Utilized During Treatment: Gait belt Activity Tolerance: Patient tolerated treatment well;Patient limited by fatigue Patient left: in chair;with call bell/phone within reach;with chair alarm set      Functional Assessment Tool Used: clinical judgement Functional Limitation: Mobility: Walking and moving around Mobility: Walking and Moving Around Current Status [id number]): At least 20 percent but less than 40 percent impaired, limited or restricted Mobility: Walking and Moving Around Goal Status [phone]): At least 1 percent but less than 20 percent impaired, limited or restricted    Time: 0949-1009 PT Time Calculation (min) (ACUTE ONLY): 20 min   Charges:   PT Evaluation $PT Eval Moderate Complexity: 1 Procedure     PT G Codes:   PT G-Codes **NOT FOR INPATIENT CLASS** Functional Assessment Tool Used: clinical judgement Functional Limitation: Mobility: Walking and moving around Mobility: Walking and Moving Around Current Status [id number]): At least 20 percent but less than 40 percent impaired, limited or restricted Mobility: Walking and Moving Around Goal Status [phone]): At least 1 percent but less than 20 percent impaired, limited or restricted    [facility] 10/25/2015, 10:41 AM

## 2015-10-25 NOTE — Progress Notes (Signed)
CSW continuing to follow.  BSW [facility] met with pt and pt sister at bedside to provide bed offers. Pt chooses bed at [facility].  CSW notified [place] of pt decision.  CSW to continue to follow and provide disposition needs when appropriate.  Harlon Flor, [place] Intern Clinical Social Work Department  [phone]

## 2015-10-26 DIAGNOSIS — Z79899 Other long term (current) drug therapy: Secondary | ICD-10-CM | POA: Diagnosis not present

## 2015-10-26 DIAGNOSIS — M47816 Spondylosis without myelopathy or radiculopathy, lumbar region: Secondary | ICD-10-CM | POA: Diagnosis not present

## 2015-10-26 DIAGNOSIS — G40909 Epilepsy, unspecified, not intractable, without status epilepticus: Secondary | ICD-10-CM | POA: Diagnosis not present

## 2015-10-26 DIAGNOSIS — D649 Anemia, unspecified: Secondary | ICD-10-CM | POA: Diagnosis not present

## 2015-10-26 DIAGNOSIS — J449 Chronic obstructive pulmonary disease, unspecified: Secondary | ICD-10-CM | POA: Diagnosis not present

## 2015-10-26 DIAGNOSIS — I10 Essential (primary) hypertension: Secondary | ICD-10-CM | POA: Diagnosis not present

## 2015-10-26 DIAGNOSIS — J438 Other emphysema: Secondary | ICD-10-CM | POA: Diagnosis not present

## 2015-10-26 DIAGNOSIS — J439 Emphysema, unspecified: Secondary | ICD-10-CM | POA: Diagnosis not present

## 2015-10-26 DIAGNOSIS — M25551 Pain in right hip: Secondary | ICD-10-CM | POA: Diagnosis not present

## 2015-10-26 DIAGNOSIS — K5901 Slow transit constipation: Secondary | ICD-10-CM | POA: Diagnosis not present

## 2015-10-26 DIAGNOSIS — I4891 Unspecified atrial fibrillation: Secondary | ICD-10-CM | POA: Diagnosis not present

## 2015-10-26 DIAGNOSIS — Z954 Presence of other heart-valve replacement: Secondary | ICD-10-CM

## 2015-10-26 DIAGNOSIS — S32401A Unspecified fracture of right acetabulum, initial encounter for closed fracture: Secondary | ICD-10-CM

## 2015-10-26 DIAGNOSIS — M25579 Pain in unspecified ankle and joints of unspecified foot: Secondary | ICD-10-CM | POA: Diagnosis not present

## 2015-10-26 DIAGNOSIS — R278 Other lack of coordination: Secondary | ICD-10-CM | POA: Diagnosis not present

## 2015-10-26 DIAGNOSIS — S32401S Unspecified fracture of right acetabulum, sequela: Secondary | ICD-10-CM | POA: Diagnosis not present

## 2015-10-26 DIAGNOSIS — Z952 Presence of prosthetic heart valve: Secondary | ICD-10-CM | POA: Diagnosis not present

## 2015-10-26 DIAGNOSIS — R600 Localized edema: Secondary | ICD-10-CM | POA: Diagnosis not present

## 2015-10-26 DIAGNOSIS — G40901 Epilepsy, unspecified, not intractable, with status epilepticus: Secondary | ICD-10-CM | POA: Diagnosis not present

## 2015-10-26 DIAGNOSIS — R2681 Unsteadiness on feet: Secondary | ICD-10-CM | POA: Diagnosis not present

## 2015-10-26 DIAGNOSIS — M159 Polyosteoarthritis, unspecified: Secondary | ICD-10-CM | POA: Diagnosis not present

## 2015-10-26 DIAGNOSIS — G4700 Insomnia, unspecified: Secondary | ICD-10-CM | POA: Diagnosis not present

## 2015-10-26 DIAGNOSIS — E612 Magnesium deficiency: Secondary | ICD-10-CM | POA: Diagnosis not present

## 2015-10-26 DIAGNOSIS — Z7901 Long term (current) use of anticoagulants: Secondary | ICD-10-CM | POA: Diagnosis not present

## 2015-10-26 DIAGNOSIS — R569 Unspecified convulsions: Secondary | ICD-10-CM | POA: Diagnosis not present

## 2015-10-26 DIAGNOSIS — R262 Difficulty in walking, not elsewhere classified: Secondary | ICD-10-CM | POA: Diagnosis not present

## 2015-10-26 DIAGNOSIS — S32511D Fracture of superior rim of right pubis, subsequent encounter for fracture with routine healing: Secondary | ICD-10-CM | POA: Diagnosis not present

## 2015-10-26 DIAGNOSIS — I429 Cardiomyopathy, unspecified: Secondary | ICD-10-CM | POA: Diagnosis not present

## 2015-10-26 DIAGNOSIS — E871 Hypo-osmolality and hyponatremia: Secondary | ICD-10-CM | POA: Diagnosis not present

## 2015-10-26 DIAGNOSIS — Z5189 Encounter for other specified aftercare: Secondary | ICD-10-CM | POA: Diagnosis not present

## 2015-10-26 DIAGNOSIS — S32471A Displaced fracture of medial wall of right acetabulum, initial encounter for closed fracture: Secondary | ICD-10-CM | POA: Diagnosis not present

## 2015-10-26 DIAGNOSIS — S32421D Displaced fracture of posterior wall of right acetabulum, subsequent encounter for fracture with routine healing: Secondary | ICD-10-CM | POA: Diagnosis not present

## 2015-10-26 DIAGNOSIS — R12 Heartburn: Secondary | ICD-10-CM | POA: Diagnosis not present

## 2015-10-26 DIAGNOSIS — R0989 Other specified symptoms and signs involving the circulatory and respiratory systems: Secondary | ICD-10-CM | POA: Diagnosis not present

## 2015-10-26 DIAGNOSIS — D509 Iron deficiency anemia, unspecified: Secondary | ICD-10-CM | POA: Diagnosis not present

## 2015-10-26 DIAGNOSIS — R5381 Other malaise: Secondary | ICD-10-CM | POA: Diagnosis not present

## 2015-10-26 DIAGNOSIS — Z9889 Other specified postprocedural states: Secondary | ICD-10-CM | POA: Diagnosis not present

## 2015-10-26 DIAGNOSIS — S72009A Fracture of unspecified part of neck of unspecified femur, initial encounter for closed fracture: Secondary | ICD-10-CM | POA: Diagnosis not present

## 2015-10-26 DIAGNOSIS — E559 Vitamin D deficiency, unspecified: Secondary | ICD-10-CM | POA: Diagnosis not present

## 2015-10-26 DIAGNOSIS — M5126 Other intervertebral disc displacement, lumbar region: Secondary | ICD-10-CM | POA: Diagnosis not present

## 2015-10-26 DIAGNOSIS — M6281 Muscle weakness (generalized): Secondary | ICD-10-CM | POA: Diagnosis not present

## 2015-10-26 DIAGNOSIS — K219 Gastro-esophageal reflux disease without esophagitis: Secondary | ICD-10-CM | POA: Diagnosis not present

## 2015-10-26 LAB — BASIC METABOLIC PANEL
Anion gap: 10 (ref 5–15)
BUN: 9 mg/dL (ref 4–21)
BUN: 9 mg/dL (ref 6–20)
CHLORIDE: 97 mmol/L — AB (ref 101–111)
CO2: 24 mmol/L (ref 22–32)
CREATININE: 0.9 mg/dL (ref 0.6–1.3)
CREATININE: 0.92 mg/dL (ref 0.61–1.24)
Calcium: 8.6 mg/dL — ABNORMAL LOW (ref 8.9–10.3)
GFR calc Af Amer: >60 mL/min (ref >60)
GLUCOSE: 107 mg/dL — AB (ref 65–99)
Glucose: 107 mg/dL
POTASSIUM: 4.5 mmol/L (ref 3.5–5.1)
SODIUM: 131 mmol/L — AB (ref 137–147)
Sodium: 131 mmol/L — ABNORMAL LOW (ref 135–145)

## 2015-10-26 LAB — PHENYTOIN LEVEL, FREE AND TOTAL
Phenytoin, Free: NOT DETECTED ug/mL (ref 1.0–2.0)
Phenytoin, Total: 1.3 ug/mL — ABNORMAL LOW (ref 10.0–20.0)

## 2015-10-26 LAB — CBC
HEMATOCRIT: 26.6 % — AB (ref 39.0–52.0)
HEMOGLOBIN: 9.1 g/dL — AB (ref 13.0–17.0)
MCH: 31.6 pg (ref 26.0–34.0)
MCHC: 34.2 g/dL (ref 30.0–36.0)
MCV: 92.4 fL (ref 78.0–100.0)
Platelets: 193 10*3/uL (ref 150–400)
RBC: 2.88 MIL/uL — AB (ref 4.22–5.81)
RDW: 12.4 % (ref 11.5–15.5)
WBC: 9.1 10*3/uL (ref 4.0–10.5)

## 2015-10-26 LAB — PROTIME-INR
INR: 3.2 — ABNORMAL HIGH (ref 0.00–1.49)
PROTHROMBIN TIME: 32.1 s — AB (ref 11.6–15.2)

## 2015-10-26 LAB — CBC AND DIFFERENTIAL: WBC: 9.1 10^3/mL

## 2015-10-26 MED ORDER — WARFARIN SODIUM 5 MG PO TABS
5.0000 mg | ORAL_TABLET | Freq: Once | ORAL | Status: DC
  Filled 2015-10-26: qty 1

## 2015-10-26 MED ORDER — OXYCODONE HCL 5 MG PO TABS: 5.0000 mg | ORAL_TABLET | Freq: Four times a day (QID) | ORAL | Status: DC | PRN

## 2015-10-26 NOTE — Progress Notes (Signed)
ANTICOAGULATION CONSULT NOTE - Follow-Up  Pharmacy Consult for warfarin Indication: atrial fibrillation/bioprosthetic mitral valve replacement   Allergies  Allergen Reactions  . Rosuvastatin Other (See Comments)    Muscle aches    Patient Measurements: Height: 5\' 9"  (175.3 cm) Weight: 157 lb 6.4 oz (71.396 kg) IBW/kg (Calculated) : 70.7  Vital Signs: Temp: 98.2 F (36.8 C) (03/29 0533) Temp Source: Oral (03/29 0533) BP: 109/50 mmHg (03/29 0533) Pulse Rate: 89 (03/29 0533)  Labs:  Recent Labs  10/24/15 0746 10/25/15 0514 10/26/15 0454  HGB 11.8* 10.3* 9.1*  HCT 34.3* 29.5* 26.6*  PLT 209 209 193  LABPROT 34.4* 37.5* 32.1*  INR 3.65* 4.09* 3.20*  CREATININE 0.90 0.84 0.92    Estimated Creatinine Clearance: 75.8 mL/min (by C-G formula based on Cr of 0.92).   Medical History: Past Medical History  Diagnosis Date  . 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 Cerebrovascular disease, unspecified   . Esophageal reflux   . Generalized osteoarthrosis, unspecified site   . Hypertension   . COPD (chronic obstructive pulmonary disease) ([place]) 1999  . Lung nodules 2008, 2014    LLL nodule removed 2008 with LLL lobectomy,  RLL nodule 20mm observation  . Atrial fibrillation ([place]) 11/11/2008    Qualifier: Diagnosis of  By: Lia Foyer, MD, Jaquelyn Bitter   . CARDIOMYOPATHY 10/20/2008    Qualifier: Diagnosis of  By: Owens Shark, RN, BSN, Lauren    . ISCHEMIC COLITIS, HX OF 10/20/2008    Qualifier: Diagnosis of  By: Owens Shark, RN, BSN, Lauren    . 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 HYPERTENSION, BENIGN 10/20/2008    Qualifier: Diagnosis of  By: Owens Shark, RN, BSN, Lauren    . Epidural hematoma ([place]) 03/01/2011    Recent fall 2012   . Severe mitral regurgitation 02/20/2013   . Iron deficiency anemia, unspecified   . S/P mitral valve replacement with bioprosthetic valve 03/24/2013    Redo mitral valve replacement using 60mm [facility] mitral bovine bioprosthetic tissue valve performed via right mini thoracotomy  . Seizures ([place])   . Prostate cancer ([place])     Medications:  Prescriptions prior to admission  Medication Sig Dispense Refill Last Dose  . Cholecalciferol (VITAMIN D3) 1000 UNITS CAPS Take 1 capsule by mouth daily.    10/23/2015 at Unknown time  . ezetimibe (ZETIA) 10 MG tablet Take 1 tablet (10 mg total) by mouth daily. 30 tablet 6 10/23/2015 at Unknown time  . methocarbamol (ROBAXIN) 500 MG tablet Take 500 mg by mouth 2 (two) times daily.   10/23/2015 at Unknown time  . metoprolol tartrate (LOPRESSOR) 25 MG tablet TAKE 1/2 TABLET BY MOUTH TWICE A DAY (Patient taking differently: Take 0.5 tablet by mouth two times a day) 30 tablet 4 10/23/2015 at 2000  . oxyCODONE (OXY IR/ROXICODONE) 5 MG immediate release tablet Take 1 tablet by mouth every 6 (six) hours as needed for moderate pain or severe pain.    10/23/2015 at 2000  . phenytoin (DILANTIN) 100 MG ER capsule Take 1 capsule (100 mg total) by mouth daily. 270 capsule 3 10/23/2015 at Unknown time  . ramipril (ALTACE) 5 MG capsule TAKE ONE CAPSULE BY MOUTH EVERY DAY (Patient taking differently: Take 1 capsule by mouth every day) 30 capsule 4 10/23/2015 at 2000  . SPIRIVA HANDIHALER  18 MCG inhalation capsule INHALE THE CONTENTS OF 1 CAPSULE EVERY DAY  VIA  HANDIHALER (Patient taking differently: Inhale the contents of 1 capsule each day using Handihaler) 90 capsule 0 10/23/2015 at Unknown time  . warfarin (COUMADIN) 5 MG tablet TAKE AS DIRECTED BY ANTICOAGULATION CLINIC (Patient taking differently: Take 5-7.5 mg by mouth every evening. Take 7.5mg  on Monday and Friday. Take 5mg  every other day of the week.) 100 tablet 1 10/23/2015 at 2000  . zolpidem (AMBIEN) 5 MG tablet Take 5 mg by mouth at bedtime as needed for  sleep. For insomnia.   Past Week at Unknown time    Assessment: 27 yoM admitted 3/27 after fall with fracture of R acetabulum.  On warfarin PTA for Afib; to continue per pharmacy while inpatient.  PMH Bioprosthetic MVR placed 2014, COPD, GERD, HTN, OA.   Baseline INR elevated on admission  Prior anticoagulation: warfarin 5 mg daily except 7.5 mg Mon and Fri; LD 3/26   Today, 10/26/2015:  CBC: hgb sl low, small drop today; Plt wnl  INR therapeutic, INR 3.20  Major drug interactions: phenytoin (on prior to admit), However, pt given load of fosphenytoin and dose increased to phenytoin 300 mg QHS. Level was <2.5. It is expected that phenytoin dose increase will initially will cause INR increase, but over time ( about 2 weeks) will cause INR levels to decrease.   No bleeding issues per nursing; moderate hemorrhage in fracture site, but no continued bleeding  Ordered heart-healthy diet  Goal of Therapy: INR 2.5-3.5  Plan:  Restart warfarin tonight, give 5 mg PO x 1.   Daily INR  CBC at least q72 hr while on warfarin  Monitor for signs of bleeding or thrombosis   Royetta Asal, PharmD, BCPS Pager [phone] 10/26/2015 9:01 AM

## 2015-10-26 NOTE — Progress Notes (Signed)
Report called to Caryl Asp, Therapist, sports at [facility].  All questions answered.  [facility] called for transport.

## 2015-10-26 NOTE — Discharge Summary (Signed)
Physician Discharge Summary  Jeffery Copeland [id number] DOB: September 01, 1945 DOA: 10/24/2015  PCP: Maximino Greenland, MD  Admit date: 10/24/2015 Discharge date: 10/26/2015  Time spent: 35 minutes  Recommendations for Outpatient Follow-up:  1. Follow-up with orthopedic surgery as an outpatient. 2. Check INRs weekly for goal 2.5-3.5. 3. [facility].   Discharge Diagnoses:  Active Problems:   ATRIAL FIBRILLATION   COPD with emphysema (HCC)   TOBACCO ABUSE, HX OF   S/P mitral valve replacement   Acetabular fracture, right, closed, initial encounter   Discharge Condition: stable  Diet recommendation:  Heart healthy  Filed Weights   10/24/15 0642 10/24/15 1812  Weight: 72.576 kg (160 lb) 71.396 kg (157 lb 6.4 oz)    History of present illness:  70 year old with past medical history of COPD as urethritis that comes in complaining of groin pain and unable to ambulate. In the ER CT scan showed a displaced comminuted fracture of the right acetabulum  Hospital Course:  Acetabular fracture: Orthopedic surgery was consulted who recommended conservative management. There was moderate amount of free fluid in the CT but his hemoglobin remained stable with INR. Physical therapy was consulted who evaluated the patient and recommended short-term rehabilitation at a facility. His pain was controlled and he will continue oxycodone at the rehabilitation facility. And MiraLAX when necessary. Follow-up with orthopedic surgery in 2-4 weeks.  COPD: Stable no changes were made to his medication  History of mitral valve replacement/atrial fibrillation: Rate controlled INR remained therapeutic.  Seizures: No changes made to his regimen.  Procedures:  CT hip  Consultations:  Orthopedics.  Discharge Exam: Filed Vitals:   10/25/15 2142 10/26/15 0533  BP: 117/68 109/50  Pulse: 96 89  Temp: 98.7 F (37.1 C) 98.2 F (36.8 C)  Resp: 19 20    General: A&O  x3 Cardiovascular: RRR Respiratory: good air movement CTA B/L  Discharge Instructions   Discharge Instructions    Diet - low sodium heart healthy    Complete by:  As directed      Increase activity slowly    Complete by:  As directed      Non weight bearing    Complete by:  As directed   Laterality:  right  Extremity:  Lower          Current Discharge Medication List    CONTINUE these medications which have CHANGED   Details  oxyCODONE (OXY IR/ROXICODONE) 5 MG immediate release tablet Take 1 tablet (5 mg total) by mouth every 6 (six) hours as needed for moderate pain or severe pain. Qty: 30 tablet, Refills: 0      CONTINUE these medications which have NOT CHANGED   Details  Cholecalciferol (VITAMIN D3) 1000 UNITS CAPS Take 1 capsule by mouth daily.     ezetimibe (ZETIA) 10 MG tablet Take 1 tablet (10 mg total) by mouth daily. Qty: 30 tablet, Refills: 6    methocarbamol (ROBAXIN) 500 MG tablet Take 500 mg by mouth 2 (two) times daily.    metoprolol tartrate (LOPRESSOR) 25 MG tablet TAKE 1/2 TABLET BY MOUTH TWICE A DAY Qty: 30 tablet, Refills: 4    phenytoin (DILANTIN) 100 MG ER capsule Take 1 capsule (100 mg total) by mouth daily. Qty: 270 capsule, Refills: 3    ramipril (ALTACE) 5 MG capsule TAKE ONE CAPSULE BY MOUTH EVERY DAY Qty: 30 capsule, Refills: 4    SPIRIVA HANDIHALER 18 MCG inhalation capsule INHALE THE CONTENTS OF 1 CAPSULE EVERY DAY  VIA  HANDIHALER Qty: 90 capsule, Refills: 0    warfarin (COUMADIN) 5 MG tablet TAKE AS DIRECTED BY ANTICOAGULATION CLINIC Qty: 100 tablet, Refills: 1    zolpidem (AMBIEN) 5 MG tablet Take 5 mg by mouth at bedtime as needed for sleep. For insomnia.       Allergies  Allergen Reactions  . Rosuvastatin Other (See Comments)    Muscle aches   Follow-up Information    Call [facility].   Why:  As needed [facility] been called to assist you with getting a rolling walker You will be contacted  today about the details but may call them if you have questions   Contact information:   [address] [place] [address] [phone]       Call [facility].   Why:  As needed [facility] been called to assist you with providing you home health physical therapy in your home You will be contacted today about the details but may call them if you have questions   Contact information:   [address] [place] [address] [phone]       Follow up with Marybelle Killings, MD In 3 weeks.   Specialty:  Orthopedic Surgery   Contact information:   [place] [address] [phone]       Follow up with [facility] .   Specialty:  Skilled Nursing Facility   Contact information:   [place] [phone]       The results of significant diagnostics from this hospitalization (including imaging, microbiology, ancillary and laboratory) are listed below for reference.    Significant Diagnostic Studies: Dg Chest 2 View  10/24/2015  CLINICAL DATA:  Shortness of breath.  Patient fell 3 days ago. EXAM: CHEST  2 VIEW COMPARISON:  CT 09/09/2015.  Radiographs 04/06/2013. FINDINGS: The heart size and mediastinal contours are stable status post median sternotomy and mitral valve replacement. Chronic volume loss and opacity inferiorly in the left hemithorax are grossly stable. There is stable mild atelectasis at the right lung base. No significant pleural effusion or pneumothorax identified. Numerous thoracic compression deformities are grossly stable. IMPRESSION: Stable postoperative chest with chronic findings in the left hemithorax. No acute findings seen. Electronically Signed   By: Richardean Sale M.D.   On: 10/24/2015 08:31   Ct Head Wo Contrast  10/24/2015  CLINICAL DATA:  The patient fell 1 week ago. The patient is on Coumadin. EXAM: CT HEAD WITHOUT CONTRAST TECHNIQUE: Contiguous axial  images were obtained from the base of the skull through the vertex without intravenous contrast. COMPARISON:  CT scan dated 02/28/2014 and brain MR dated 07/14/2014 FINDINGS: There is no acute intracranial hemorrhage, acute infarction, or intracranial mass lesion. Diffuse slight atrophy. Slight periventricular white matter small vessel ischemic changes. Tiny old lacunar infarct in the head of the left caudate nucleus. Since the prior brain MR the patient is has developed extensive mucosal changes in the ethmoid and maxillary sinuses with air-fluid levels in both maxillary sinuses. There is also slight mucosal thickening in the base of the frontal sinus. IMPRESSION: No acute abnormality of the brain. Chronic small vessel ischemic changes. Possible acute on chronic sinusitis. Electronically Signed   By: Lorriane Shire M.D.   On: 10/24/2015 11:05   Ct Angio Pelvis W/cm &/or Wo/cm  10/24/2015  CLINICAL DATA:  Right hip pain after falling 1 week ago. Right acetabular  fracture with pelvic hemorrhage on CT. Evaluate for vascular or bladder injury. EXAM: CT ANGIOGRAPHY OF PELVIS TECHNIQUE: Multidetector CT imaging of the pelvis was performed before and during bolus injection of intravenous contrast. Multiplanar CT angiographic image reconstructions including MIPs were generated to evaluate the vascular anatomy. CONTRAST:  100 ml Omnipaque 300. COMPARISON:  Abdominal pelvic CT performed earlier same date. FINDINGS: Urinary Tract: The ureters and bladder are filled with contrast from the preceding CT. No evidence of ureteral injury. The bladder demonstrates no evidence of urothelial lesion. As before, there is hemorrhage within the pre-vascular space. No evidence of leakage of contrast from the bladder lumen. Bowel: No bowel wall thickening, distention or surrounding inflammation identified within the pelvis. No evidence of bowel or mesenteric injury. Vascular/Lymphatic: Iliac artery atherosclerosis noted. No evidence of  active contrast extravasation. No adenopathy. Reproductive: Radioactive seeds are noted within the prostate gland. No acute findings. Other: Perivesical hemorrhage is again noted with hemorrhage extending into the perirectal fat. There is no significant free pelvic fluid. Small umbilical hernia containing only fat noted. Musculoskeletal: Again demonstrated is a comminuted and mildly displaced fracture involving the right acetabulum and superior pubic ramus. The right femoral head is intact. No acute left pelvic findings identified. There is an old posttraumatic deformity of the left pubic rami. The sacroiliac joints are intact. Review of the MIP images confirms the above findings. IMPRESSION: 1. No evidence of acute vascular injury or active extravasation into the pelvis. 2. Pelvic hemorrhage within the perivesical space. No significant free peritoneal fluid. No evidence of bladder or ureteral injury. 3. Comminuted right pelvic fractures involving the acetabulum. Electronically Signed   By: Richardean Sale M.D.   On: 10/24/2015 11:23   Ct Abdomen Pelvis W Contrast  10/24/2015  CLINICAL DATA:  Right groin pain with right leg swelling, unable to ambulate since March 26th at 11 a.m. History of prostate cancer status post radiation therapy completed in December of 2016. History of left lower lobectomy for pulmonary nodules. History of right middle lobe nodule. EXAM: CT ABDOMEN AND PELVIS WITH CONTRAST TECHNIQUE: Multidetector CT imaging of the abdomen and pelvis was performed using the standard protocol following bolus administration of intravenous contrast. CONTRAST:  163mL ISOVUE-300 IOPAMIDOL (ISOVUE-300) INJECTION 61% COMPARISON:  Chest CT dated 09/09/2015, CT abdomen dated 03/01/2005. FINDINGS: Lower chest: Lung bases difficult to characterize due to patient motion artifact. Small consolidation at the right base is new, most likely atelectasis. Stable dense consolidation the left lung base is likely atelectasis.  Hepatobiliary: No masses or other significant abnormality. Pancreas: No mass, inflammatory changes, or other significant abnormality. Spleen: Within normal limits in size and appearance. Adrenals/Urinary Tract: Bilateral renal cysts Stomach/Bowel: Bowel is normal in caliber. No bowel wall thickening or evidence of bowel wall inflammation seen. Appendix is normal. Vascular/Lymphatic: Scattered atherosclerotic changes throughout the normal- caliber abdominal aorta and pelvic vasculature. No acute- appearing vascular abnormality identified. Reproductive: No acute findings. Other: Hyperdense free fluid within the pelvis, likely hemorrhage, small to moderate in amount, seen most prominently along the anterior and lateral margins of the bladder suggesting extraperitoneal hemorrhage. No active hemorrhage/contrast extravasation identified. Musculoskeletal: Displaced/comminuted fractures of the right acetabulum extending superiorly into the right iliac bone just anterior to the right sacroiliac joint space. There is involvement of the anterior and posterior acetabulum. There is medial displacement of the central acetabular fracture fragment with associated femoral head protrusio. No convincing evidence of pathologic fracture. No fracture identified within the right femoral head or right femoral neck.  No additional fracture seen within the pelvis. There is an old healed fracture adjacent to the left symphysis pubis. There is a small periumbilical abdominal wall hernia which contains mesenteric fat only. Superficial soft tissues otherwise unremarkable. IMPRESSION: 1. Displaced/comminuted fractures of the right acetabulum, involving the anterior and posterior acetabulum, with upwards extension into the right iliac bone just anterior to the right SI joint. Medial displacement of the central acetabular fracture fragment with associated femoral head protrusio. No convincing evidence of pathologic fracture. No fracture seen within  the adjacent sacrum. SI joints remain normally aligned. No fracture seen within the right femoral head or right femoral neck. 2. Moderate amount of free fluid, presumed hemorrhage, within the extraperitoneal pelvis. Questionable small amount of intraperitoneal free fluid/hemorrhage immediately adjacent to the bladder. No active hemorrhage/contrast extravasation identified on this exam but would consider CT angiogram of the pelvis for more definitive exclusion of active hemorrhage and/or bladder disruption (contrast from this exam should be within the bladder on any subsequent imaging). 3. Additional chronic/incidental findings detailed above. These results and recommendations were called by telephone at the time of interpretation on 10/24/2015 at 9:30 am to Dr. Gareth Morgan , who verbally acknowledged these results. Electronically Signed   By: Franki Cabot M.D.   On: 10/24/2015 09:43   Dg Hip Unilat With Pelvis 2-3 Views Right  10/24/2015  CLINICAL DATA:  Status post fall March 24th with persistent pain involving the right hip with inability to bear weight. EXAM: DG HIP (WITH OR WITHOUT PELVIS) 2-3V RIGHT COMPARISON:  AP view of the abdomen of March 02, 2007. FINDINGS: The bones are osteopenic. The iliac bone appears preserved. The pubic rami are intact. There is subtle cortical irregularity on 1 of the AP views involving the medial wall of the acetabulum. There is mild narrowing of the right hip joint though this is less conspicuous than the narrowing on the left. The articular surface of the acetabulum and femoral head remains smoothly rounded. The femoral neck and intertrochanteric regions are normal. IMPRESSION: Possible minimally displaced fracture of the medial wall of the acetabulum. No definite fracture observed elsewhere. There is moderate degenerative joint space loss of the right hip. Incidentally noted is moderate to severe joint space loss on the left. CT scanning of the right hip is recommended.  Electronically Signed   By: David  Martinique M.D.   On: 10/24/2015 08:31    Microbiology: No results found for this or any previous visit (from the past 240 hour(s)).   Labs: Basic Metabolic Panel:  Recent Labs Lab 10/24/15 0746 10/25/15 0514 10/26/15 0454  NA 130* 130* 131*  K 4.5 4.5 4.5  CL 97* 98* 97*  CO2 25 25 24   GLUCOSE 139* 128* 107*  BUN 9 7 9   CREATININE 0.90 0.84 0.92  CALCIUM 8.7* 8.3* 8.6*   Liver Function Tests:  Recent Labs Lab 10/24/15 0746  AST 27  ALT 29  ALKPHOS 88  BILITOT 0.5  PROT 6.6  ALBUMIN 4.1    Recent Labs Lab 10/24/15 0746  LIPASE 28   No results for input(s): AMMONIA in the last 168 hours. CBC:  Recent Labs Lab 10/24/15 0746 10/25/15 0514 10/26/15 0454  WBC 10.7* 10.2 9.1  NEUTROABS 8.7*  --   --   HGB 11.8* 10.3* 9.1*  HCT 34.3* 29.5* 26.6*  MCV 94.5 94.6 92.4  PLT 209 209 193   Cardiac Enzymes: No results for input(s): CKTOTAL, CKMB, CKMBINDEX, TROPONINI in the last 168 hours. BNP: BNP (  last 3 results) No results for input(s): BNP in the last 8760 hours.  ProBNP (last 3 results) No results for input(s): PROBNP in the last 8760 hours.  CBG: No results for input(s): GLUCAP in the last 168 hours.   Signed:  Charlynne Cousins MD.  [facility] 10/26/2015, 12:29 PM

## 2015-10-26 NOTE — Progress Notes (Signed)
Foley catheter removed at 1140 for voiding trial.

## 2015-10-26 NOTE — Care Management Note (Signed)
Case Management Note  Patient Details  Name: Jeffery Copeland MRN: [id number] Date of Birth: 06-15-1946  Subjective/Objective:                    Action/Plan:d/c SNF.   Expected Discharge Date:   (unknown)               Expected Discharge Plan:  Skilled Nursing Facility  In-House Referral:  Clinical Social Work  Discharge planning Services  CM Consult  Post Acute Care Choice:    Choice offered to:     DME Arranged:    DME Agency:     HH Arranged:    [place] Agency:     Status of Service:  Completed, signed off  Medicare Important Message Given:    Date Medicare IM Given:    Medicare IM give by:    Date Additional Medicare IM Given:    Additional Medicare Important Message give by:     If discussed at [place] of Stay Meetings, dates discussed:    Additional Comments:  Dessa Phi, RN 10/26/2015, 12:39 PM

## 2015-10-26 NOTE — Progress Notes (Signed)
[facility] called for transport.     Ajaya Crutchfield, LCSW [facility] Clinical Social Worker cell #: [phone]  

## 2015-10-26 NOTE — Clinical Social Work Placement (Signed)
Patient is set to discharge to [facility] today. Patient aware & CSW left voicemail informing patient's sister, Altha Harm. Discharge packet given to RN, Joellen Jersey.  [facility] will be called for transport when ready.     Raynaldo Opitz, [facility] Clinical Social Worker cell #: [phone]    CLINICAL SOCIAL WORK PLACEMENT  NOTE  Date:  10/26/2015  Patient Details  Name: Jeffery Copeland MRN: [id number] Date of Birth: 08-25-1945  Clinical Social Work is seeking post-discharge placement for this patient at the [place] level of care (*CSW will initial, date and re-position this form in  chart as items are completed):  Yes   Patient/family provided with [place] Work Department's list of facilities offering this level of care within the geographic area requested by the patient (or if unable, by the patient's family).  Yes   Patient/family informed of their freedom to choose among providers that offer the needed level of care, that participate in Medicare, Medicaid or managed care program needed by the patient, have an available bed and are willing to accept the patient.  Yes   Patient/family informed of [facility]'s ownership interest in [facility] and [facility], as well as of the fact that they are under no obligation to receive care at these facilities.  PASRR submitted to E[employer] on 10/25/15     PASRR number received on 10/25/15     Existing PASRR number confirmed on       FL2 transmitted to all facilities in geographic area requested by pt/family on 10/25/15     FL2 transmitted to all facilities within larger geographic area on 10/25/15     Patient informed that his/her managed care company has contracts with or will negotiate with certain facilities, including the following:        Yes   Patient/family informed of bed offers received.  Patient [facility] bed at [facility]     Physician recommends and patient  chooses bed at      Patient to be transferred to [facility] on 10/26/15.  Patient to be transferred to facility by [facility]     Patient family notified on 10/26/15 of transfer.  Name of family member notified:  patient's sister, Altha Harm via phone     PHYSICIAN       Additional Comment:    _______________________________________________ Standley Brooking, LCSW 10/26/2015, 2:29 PM

## 2015-10-27 ENCOUNTER — Encounter: Payer: Self-pay | Admitting: Adult Health

## 2015-10-27 ENCOUNTER — Non-Acute Institutional Stay (SKILLED_NURSING_FACILITY): Payer: Commercial Managed Care - HMO | Admitting: Adult Health

## 2015-10-27 DIAGNOSIS — D509 Iron deficiency anemia, unspecified: Secondary | ICD-10-CM

## 2015-10-27 DIAGNOSIS — R569 Unspecified convulsions: Secondary | ICD-10-CM | POA: Diagnosis not present

## 2015-10-27 DIAGNOSIS — Z9889 Other specified postprocedural states: Secondary | ICD-10-CM | POA: Diagnosis not present

## 2015-10-27 DIAGNOSIS — I10 Essential (primary) hypertension: Secondary | ICD-10-CM

## 2015-10-27 DIAGNOSIS — I4891 Unspecified atrial fibrillation: Secondary | ICD-10-CM | POA: Diagnosis not present

## 2015-10-27 DIAGNOSIS — J438 Other emphysema: Secondary | ICD-10-CM | POA: Diagnosis not present

## 2015-10-27 DIAGNOSIS — E559 Vitamin D deficiency, unspecified: Secondary | ICD-10-CM | POA: Diagnosis not present

## 2015-10-27 DIAGNOSIS — S32401A Unspecified fracture of right acetabulum, initial encounter for closed fracture: Secondary | ICD-10-CM

## 2015-10-27 DIAGNOSIS — G4700 Insomnia, unspecified: Secondary | ICD-10-CM

## 2015-10-27 NOTE — Progress Notes (Addendum)
Patient ID: Jeffery Copeland, male   DOB: Jan 28, 1946, 70 y.o.   MRN: [id number]    DATE:  10/27/2015   MRN:  [id number]  BIRTHDAY: [birth year]  Facility:  [facility] Home Location:  [facility] Room Number: [id number]  LEVEL OF CARE:  SNF (31)  Contact Information    Name Relation Home Work [place] Sister [phone]     Tyjah, Schull[place] [phone]         Code Status History    Date Active Date Inactive Code Status Order ID Comments User Context   10/24/2015  6:11 PM 10/26/2015  7:18 PM Full Code [id number]  Geradine Girt, DO Inpatient   03/26/2013  8:17 AM 03/31/2013  3:27 PM Full Code [id number]  Rexene Alberts, MD Inpatient   03/24/2013  4:48 PM 03/26/2013  8:17 AM Full Code [id number]  Rexene Alberts, MD Inpatient    Advance Directive Documentation        Most Recent Value   Type of Advance Directive  Out of facility DNR (pink MOST or yellow form)   Pre-existing out of facility DNR order (yellow form or pink MOST form)     "MOST" Form in Place?         Chief Complaint  Patient presents with  . Hospitalization Follow-up    HISTORY OF PRESENT ILLNESS:  This is a 70 year old male who has been admitted to [facility] on 10/26/15 from [place] has PMH of esophageal reflux, hypertension, COPD, atrial fibrillation, anemia, seizure and prostate cancer. He complained of pain in the groin making him unable to ambulate. CT scan  showed a displaced/comminuted fractures of the right acetabulum, involving the anterior and posterior acetabulum, with upwards extension into the right iliac bone just anterior to the right SI joint. Orthopedic surgery was consulted who recommended conservative management.  He has been admitted for a short-term rehabilitation.  PAST MEDICAL HISTORY:  Past Medical History  Diagnosis Date  . 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 Cerebrovascular disease, unspecified   .  Esophageal reflux   . Generalized osteoarthrosis, unspecified site   . Hypertension   . COPD (chronic obstructive pulmonary disease) ([place]) 1999  . Lung nodules 2008, 2014    LLL nodule removed 2008 with LLL lobectomy,  RLL nodule 59mm observation  . Atrial fibrillation ([place]) 11/11/2008    Qualifier: Diagnosis of  By: Lia Foyer, MD, Jaquelyn Bitter   . CARDIOMYOPATHY 10/20/2008    Qualifier: Diagnosis of  By: Owens Shark, RN, BSN, Lauren    . ISCHEMIC COLITIS, HX OF 10/20/2008    Qualifier: Diagnosis of  By: Owens Shark, RN, BSN, Lauren    . S/P mitral valve replacement 02/01/1998    Carpentier-Edwards porcine bioprosthetic tissue valve, size 54mm Placed for acute and subacute bacterial endocarditis (group G Streptococcus) with pre-existing mitral valve prolapse   . HYPERTENSION, BENIGN 10/20/2008    Qualifier: Diagnosis of  By: Owens Shark, RN, BSN, Lauren    . Epidural hematoma ([place]) 03/01/2011    Recent fall 2012   . Severe mitral regurgitation 02/20/2013  . Iron deficiency anemia, unspecified   . S/P mitral valve replacement with bioprosthetic valve 03/24/2013    Redo mitral valve replacement using 43mm [facility] mitral bovine bioprosthetic tissue valve performed via right mini thoracotomy  . Seizures ([place])   . Prostate cancer ([place])   . History of tobacco abuse   . Closed  right acetabular fracture ([facility])      CURRENT MEDICATIONS: Reviewed  Patient's Medications  New Prescriptions   No medications on file  Previous Medications   CHOLECALCIFEROL (VITAMIN D3) 1000 UNITS CAPS    Take 1 capsule by mouth daily.    EZETIMIBE (ZETIA) 10 MG TABLET    Take 1 tablet (10 mg total) by mouth daily.   METHOCARBAMOL (ROBAXIN) 500 MG TABLET    Take 500 mg by mouth 2 (two) times daily.   METOPROLOL TARTRATE (LOPRESSOR) 25 MG TABLET    TAKE 1/2 TABLET BY MOUTH TWICE A DAY   OXYCODONE (OXY IR/ROXICODONE) 5 MG IMMEDIATE RELEASE TABLET    Take 1 tablet (5 mg total) by mouth every 6 (six) hours as needed for moderate pain  or severe pain.   PHENYTOIN (DILANTIN) 100 MG ER CAPSULE    Take 1 capsule (100 mg total) by mouth daily.   RAMIPRIL (ALTACE) 5 MG CAPSULE    TAKE ONE CAPSULE BY MOUTH EVERY DAY   SPIRIVA HANDIHALER 18 MCG INHALATION CAPSULE    INHALE THE CONTENTS OF 1 CAPSULE EVERY DAY  VIA  HANDIHALER   ZOLPIDEM (AMBIEN) 5 MG TABLET    Take 5 mg by mouth at bedtime as needed for sleep. For insomnia.  Modified Medications   No medications on file  Discontinued Medications   WARFARIN (COUMADIN) 5 MG TABLET    TAKE AS DIRECTED BY ANTICOAGULATION CLINIC     Allergies  Allergen Reactions  . Rosuvastatin Other (See Comments)    Muscle aches     REVIEW OF SYSTEMS:  GENERAL: no change in appetite, no fatigue, no weight changes, no fever, chills or weakness EYES: Denies change in vision, dry eyes, eye pain, itching or discharge EARS: Denies change in hearing, ringing in ears, or earache NOSE: Denies nasal congestion or epistaxis MOUTH and THROAT: Denies oral discomfort, gingival pain or bleeding, pain from teeth or hoarseness   RESPIRATORY: no cough, SOB, DOE, wheezing, hemoptysis CARDIAC: no chest pain, edema or palpitations GI: no abdominal pain, diarrhea, constipation, heart burn, nausea or vomiting GU: Denies dysuria, frequency, hematuria, incontinence, or discharge PSYCHIATRIC: Denies feeling of depression or anxiety. No report of hallucinations, insomnia, paranoia, or agitation    PHYSICAL EXAMINATION  GENERAL APPEARANCE: Well nourished. In no acute distress. Normal body habitus SKIN:  Skin is warm and dry.  HEAD: Normal in size and contour. No evidence of trauma EYES: Lids open and close normally. No blepharitis, entropion or ectropion. PERRL. Conjunctivae are clear and sclerae are white. Lenses are without opacity EARS: Pinnae are normal. Patient hears normal voice tunes of the examiner MOUTH and THROAT: Lips are without lesions. Oral mucosa is moist and without lesions. Tongue is normal in  shape, size, and color and without lesions NECK: supple, trachea midline, no neck masses, no thyroid tenderness, no thyromegaly LYMPHATICS: no LAN in the neck, no supraclavicular LAN RESPIRATORY: breathing is even & unlabored, BS CTAB CARDIAC: RRR, no murmur,no extra heart sounds, no edema GI: abdomen soft, normal BS, no masses, no tenderness, no hepatomegaly, no splenomegaly EXTREMITIES:  Able to move BUE and LLE; unable to move RLE PSYCHIATRIC: Alert and oriented X 3. Affect and behavior are appropriate  LABS/RADIOLOGY: Labs reviewed: Basic Metabolic Panel:  Recent Labs  10/24/15 0746 10/25/15 0514 10/26/15 0454  NA 130* 130* 131*  K 4.5 4.5 4.5  CL 97* 98* 97*  CO2 25 25 24   GLUCOSE 139* 128* 107*  BUN 9 7 9  CREATININE 0.90 0.84 0.92  CALCIUM 8.7* 8.3* 8.6*   Liver Function Tests:  Recent Labs  05/25/15 1304 10/24/15 0746  AST 16 27  ALT 13 29  ALKPHOS 118* 88  BILITOT 0.5 0.5  PROT 7.0 6.6  ALBUMIN 4.5 4.1    Recent Labs  10/24/15 0746  LIPASE 28   CBC:  Recent Labs  05/25/15 1304  08/12/15 1500 10/24/15 0746 10/25/15 0514 10/26/15 0454  WBC 6.1  < > 5.9 10.7* 10.2 9.1  NEUTROABS 3.9  --  3.7 8.7*  --   --   HGB  --   < > 12.6* 11.8* 10.3* 9.1*  HCT 37.8  < > 37.6* 34.3* 29.5* 26.6*  MCV 96  < > 95.4 94.5 94.6 92.4  PLT 217  < > 178 209 209 193  < > = values in this interval not displayed.  Lipid Panel:  Recent Labs  08/12/15 1500  HDL 51     Dg Chest 2 View  10/24/2015  CLINICAL DATA:  Shortness of breath.  Patient fell 3 days ago. EXAM: CHEST  2 VIEW COMPARISON:  CT 09/09/2015.  Radiographs 04/06/2013. FINDINGS: The heart size and mediastinal contours are stable status post median sternotomy and mitral valve replacement. Chronic volume loss and opacity inferiorly in the left hemithorax are grossly stable. There is stable mild atelectasis at the right lung base. No significant pleural effusion or pneumothorax identified. Numerous thoracic  compression deformities are grossly stable. IMPRESSION: Stable postoperative chest with chronic findings in the left hemithorax. No acute findings seen. Electronically Signed   By: Richardean Sale M.D.   On: 10/24/2015 08:31   Ct Head Wo Contrast  10/24/2015  CLINICAL DATA:  The patient fell 1 week ago. The patient is on Coumadin. EXAM: CT HEAD WITHOUT CONTRAST TECHNIQUE: Contiguous axial images were obtained from the base of the skull through the vertex without intravenous contrast. COMPARISON:  CT scan dated 02/28/2014 and brain MR dated 07/14/2014 FINDINGS: There is no acute intracranial hemorrhage, acute infarction, or intracranial mass lesion. Diffuse slight atrophy. Slight periventricular white matter small vessel ischemic changes. Tiny old lacunar infarct in the head of the left caudate nucleus. Since the prior brain MR the patient is has developed extensive mucosal changes in the ethmoid and maxillary sinuses with air-fluid levels in both maxillary sinuses. There is also slight mucosal thickening in the base of the frontal sinus. IMPRESSION: No acute abnormality of the brain. Chronic small vessel ischemic changes. Possible acute on chronic sinusitis. Electronically Signed   By: Lorriane Shire M.D.   On: 10/24/2015 11:05   Ct Angio Pelvis W/cm &/or Wo/cm  10/24/2015  CLINICAL DATA:  Right hip pain after falling 1 week ago. Right acetabular fracture with pelvic hemorrhage on CT. Evaluate for vascular or bladder injury. EXAM: CT ANGIOGRAPHY OF PELVIS TECHNIQUE: Multidetector CT imaging of the pelvis was performed before and during bolus injection of intravenous contrast. Multiplanar CT angiographic image reconstructions including MIPs were generated to evaluate the vascular anatomy. CONTRAST:  100 ml Omnipaque 300. COMPARISON:  Abdominal pelvic CT performed earlier same date. FINDINGS: Urinary Tract: The ureters and bladder are filled with contrast from the preceding CT. No evidence of ureteral injury.  The bladder demonstrates no evidence of urothelial lesion. As before, there is hemorrhage within the pre-vascular space. No evidence of leakage of contrast from the bladder lumen. Bowel: No bowel wall thickening, distention or surrounding inflammation identified within the pelvis. No evidence of bowel or mesenteric injury.  Vascular/Lymphatic: Iliac artery atherosclerosis noted. No evidence of active contrast extravasation. No adenopathy. Reproductive: Radioactive seeds are noted within the prostate gland. No acute findings. Other: Perivesical hemorrhage is again noted with hemorrhage extending into the perirectal fat. There is no significant free pelvic fluid. Small umbilical hernia containing only fat noted. Musculoskeletal: Again demonstrated is a comminuted and mildly displaced fracture involving the right acetabulum and superior pubic ramus. The right femoral head is intact. No acute left pelvic findings identified. There is an old posttraumatic deformity of the left pubic rami. The sacroiliac joints are intact. Review of the MIP images confirms the above findings. IMPRESSION: 1. No evidence of acute vascular injury or active extravasation into the pelvis. 2. Pelvic hemorrhage within the perivesical space. No significant free peritoneal fluid. No evidence of bladder or ureteral injury. 3. Comminuted right pelvic fractures involving the acetabulum. Electronically Signed   By: Richardean Sale M.D.   On: 10/24/2015 11:23   Ct Abdomen Pelvis W Contrast  10/24/2015  CLINICAL DATA:  Right groin pain with right leg swelling, unable to ambulate since March 26th at 11 a.m. History of prostate cancer status post radiation therapy completed in December of 2016. History of left lower lobectomy for pulmonary nodules. History of right middle lobe nodule. EXAM: CT ABDOMEN AND PELVIS WITH CONTRAST TECHNIQUE: Multidetector CT imaging of the abdomen and pelvis was performed using the standard protocol following bolus  administration of intravenous contrast. CONTRAST:  129mL ISOVUE-300 IOPAMIDOL (ISOVUE-300) INJECTION 61% COMPARISON:  Chest CT dated 09/09/2015, CT abdomen dated 03/01/2005. FINDINGS: Lower chest: Lung bases difficult to characterize due to patient motion artifact. Small consolidation at the right base is new, most likely atelectasis. Stable dense consolidation the left lung base is likely atelectasis. Hepatobiliary: No masses or other significant abnormality. Pancreas: No mass, inflammatory changes, or other significant abnormality. Spleen: Within normal limits in size and appearance. Adrenals/Urinary Tract: Bilateral renal cysts Stomach/Bowel: Bowel is normal in caliber. No bowel wall thickening or evidence of bowel wall inflammation seen. Appendix is normal. Vascular/Lymphatic: Scattered atherosclerotic changes throughout the normal- caliber abdominal aorta and pelvic vasculature. No acute- appearing vascular abnormality identified. Reproductive: No acute findings. Other: Hyperdense free fluid within the pelvis, likely hemorrhage, small to moderate in amount, seen most prominently along the anterior and lateral margins of the bladder suggesting extraperitoneal hemorrhage. No active hemorrhage/contrast extravasation identified. Musculoskeletal: Displaced/comminuted fractures of the right acetabulum extending superiorly into the right iliac bone just anterior to the right sacroiliac joint space. There is involvement of the anterior and posterior acetabulum. There is medial displacement of the central acetabular fracture fragment with associated femoral head protrusio. No convincing evidence of pathologic fracture. No fracture identified within the right femoral head or right femoral neck. No additional fracture seen within the pelvis. There is an old healed fracture adjacent to the left symphysis pubis. There is a small periumbilical abdominal wall hernia which contains mesenteric fat only. Superficial soft tissues  otherwise unremarkable. IMPRESSION: 1. Displaced/comminuted fractures of the right acetabulum, involving the anterior and posterior acetabulum, with upwards extension into the right iliac bone just anterior to the right SI joint. Medial displacement of the central acetabular fracture fragment with associated femoral head protrusio. No convincing evidence of pathologic fracture. No fracture seen within the adjacent sacrum. SI joints remain normally aligned. No fracture seen within the right femoral head or right femoral neck. 2. Moderate amount of free fluid, presumed hemorrhage, within the extraperitoneal pelvis. Questionable small amount of intraperitoneal free fluid/hemorrhage immediately adjacent  to the bladder. No active hemorrhage/contrast extravasation identified on this exam but would consider CT angiogram of the pelvis for more definitive exclusion of active hemorrhage and/or bladder disruption (contrast from this exam should be within the bladder on any subsequent imaging). 3. Additional chronic/incidental findings detailed above. These results and recommendations were called by telephone at the time of interpretation on 10/24/2015 at 9:30 am to Dr. Gareth Morgan , who verbally acknowledged these results. Electronically Signed   By: Franki Cabot M.D.   On: 10/24/2015 09:43   Dg Hip Unilat With Pelvis 2-3 Views Right  10/24/2015  CLINICAL DATA:  Status post fall March 24th with persistent pain involving the right hip with inability to bear weight. EXAM: DG HIP (WITH OR WITHOUT PELVIS) 2-3V RIGHT COMPARISON:  AP view of the abdomen of March 02, 2007. FINDINGS: The bones are osteopenic. The iliac bone appears preserved. The pubic rami are intact. There is subtle cortical irregularity on 1 of the AP views involving the medial wall of the acetabulum. There is mild narrowing of the right hip joint though this is less conspicuous than the narrowing on the left. The articular surface of the acetabulum and  femoral head remains smoothly rounded. The femoral neck and intertrochanteric regions are normal. IMPRESSION: Possible minimally displaced fracture of the medial wall of the acetabulum. No definite fracture observed elsewhere. There is moderate degenerative joint space loss of the right hip. Incidentally noted is moderate to severe joint space loss on the left. CT scanning of the right hip is recommended. Electronically Signed   By: David  Martinique M.D.   On: 10/24/2015 08:31    ASSESSMENT/PLAN:  Right acetabular fracture - orthopedic recommended conservative management; for rehabilitation; continue oxycodone 5 mg 1 tab by mouth every 6 hours when necessary for pain; Robaxin 500 mg 1 tab by mouth twice a day for muscle spasm; follow-up with orthopedic surgery, Dr Lorin Mercy, 3 weeks  Anemia, iron deficiency - hemoglobin 9.1; check CBC  Hyponatremia - sodium 131; check BMP  COPD - no SOB; continue Spiriva 18 g inhale 1 capsule daily via [employer]  Hypertension - continue Altace 5 mg 1 capsule by mouth daily and Lopressor 25 mg 1/2 tab = the 0.5 mg 1 tab by mouth twice a day  Seizure - continue Dilantin 100 mg 1 capsule by mouth daily  Vitamin D deficiency - continue vitamin D3 1000 units 1 capsule by mouth daily  History of mitral valve replacement last atrial fibrillation - continue Coumadin and Lopressor 25 mg 1/2 tab = 12.5 mg BID  Insomnia - continue Ambien 5 mg 1 tab by mouth daily at bedtime when necessary     Goals of care:  Short-term rehabilitation     [facility], [facility] [phone]

## 2015-10-28 ENCOUNTER — Encounter: Payer: Self-pay | Admitting: Internal Medicine

## 2015-10-28 ENCOUNTER — Non-Acute Institutional Stay: Payer: Commercial Managed Care - HMO | Admitting: Internal Medicine

## 2015-10-28 DIAGNOSIS — G40909 Epilepsy, unspecified, not intractable, without status epilepticus: Secondary | ICD-10-CM | POA: Diagnosis not present

## 2015-10-28 DIAGNOSIS — I10 Essential (primary) hypertension: Secondary | ICD-10-CM | POA: Diagnosis not present

## 2015-10-28 DIAGNOSIS — G4700 Insomnia, unspecified: Secondary | ICD-10-CM

## 2015-10-28 DIAGNOSIS — I4891 Unspecified atrial fibrillation: Secondary | ICD-10-CM

## 2015-10-28 DIAGNOSIS — R2681 Unsteadiness on feet: Secondary | ICD-10-CM

## 2015-10-28 DIAGNOSIS — E871 Hypo-osmolality and hyponatremia: Secondary | ICD-10-CM | POA: Diagnosis not present

## 2015-10-28 DIAGNOSIS — D649 Anemia, unspecified: Secondary | ICD-10-CM

## 2015-10-28 DIAGNOSIS — Z7901 Long term (current) use of anticoagulants: Secondary | ICD-10-CM | POA: Diagnosis not present

## 2015-10-28 DIAGNOSIS — Z954 Presence of other heart-valve replacement: Secondary | ICD-10-CM

## 2015-10-28 DIAGNOSIS — S32401S Unspecified fracture of right acetabulum, sequela: Secondary | ICD-10-CM | POA: Diagnosis not present

## 2015-10-28 DIAGNOSIS — R12 Heartburn: Secondary | ICD-10-CM | POA: Diagnosis not present

## 2015-10-28 DIAGNOSIS — Z952 Presence of prosthetic heart valve: Secondary | ICD-10-CM

## 2015-10-28 DIAGNOSIS — J449 Chronic obstructive pulmonary disease, unspecified: Secondary | ICD-10-CM | POA: Diagnosis not present

## 2015-10-28 LAB — CBC AND DIFFERENTIAL
HEMATOCRIT: 28 % — AB (ref 41–53)
HEMOGLOBIN: 9.4 g/dL — AB (ref 13.5–17.5)
Neutrophils Absolute: 6 /uL
Platelets: 252 10*3/uL (ref 150–399)
WBC: 9.1 10*3/mL

## 2015-10-28 LAB — BASIC METABOLIC PANEL
BUN: 12 mg/dL (ref 4–21)
Creatinine: 0.9 mg/dL (ref 0.6–1.3)
Glucose: 99 mg/dL
POTASSIUM: 4.4 mmol/L (ref 3.4–5.3)
SODIUM: 130 mmol/L — AB (ref 137–147)

## 2015-10-28 NOTE — Progress Notes (Signed)
LOCATION: [place]  PCP: Maximino Greenland, MD   Code Status: DNR  Goals of care: Advanced Directive information Advanced Directives 10/27/2015  Does patient have an advance directive? Yes  Type of Advance Directive Out of facility DNR (pink MOST or yellow form)  Does patient want to make changes to advanced directive? No - Patient declined  Copy of advanced directive(s) in chart? Yes  Would patient like information on creating an advanced directive? -       Extended Emergency Contact Information Primary Emergency Contact: Whitmire,Christine Address: [phone] - [address]          [place], [place] Phone: [phone] Relation: Sister Secondary Emergency Contact: Princess Perna [place] Phone: [phone] Relation: Son   Allergies  Allergen Reactions  . Rosuvastatin Other (See Comments)    Muscle aches    Chief Complaint  Patient presents with  . New Admit To SNF    New Admission     HPI:  Patient is a 70 y.o. male seen today for short term rehabilitation post hospital admission from 10/24/15-10/26/15 post fall with right acetabular fracture. Orthopedic was consulted and recommended conservative management. He has PMH of COPD, seizures, mitral valve replacement, afib. He is seen in his room today. He is hard of hearing. He complaints of pain.   Review of Systems:  Constitutional: Negative for fever, chills, diaphoresis. Energy is slowly coming back. HENT: Negative for headache, congestion, nasal discharg. Positive for difficulty swallowing.   Eyes: Negative for blurred vision, double vision and discharge.  Respiratory: Negative for wheezing. Positive for shortness of breath with exertion and rest. Also positive for cough with white phlegm.  Cardiovascular: Negative for chest pain, palpitations, leg swelling.  Gastrointestinal: Negative for nausea, vomiting, abdominal pain, loss of appetite. Appetite is  approving. Last bowel movement was today.  Genitourinary: Negative for dysuria and flank pain.  Musculoskeletal: Negative for back pain, fall in the facility.  Skin: Negative for itching, rash.  Neurological: Negative for dizziness. Psychiatric/Behavioral: Negative for depression.    Past Medical History  Diagnosis Date  . 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 Cerebrovascular disease, unspecified   . Esophageal reflux   . Generalized osteoarthrosis, unspecified site   . Hypertension   . COPD (chronic obstructive pulmonary disease) ([place]) 1999  . Lung nodules 2008, 2014    LLL nodule removed 2008 with LLL lobectomy,  RLL nodule 71mm observation  . Atrial fibrillation ([place]) 11/11/2008    Qualifier: Diagnosis of  By: Lia Foyer, MD, Jaquelyn Bitter   . CARDIOMYOPATHY 10/20/2008    Qualifier: Diagnosis of  By: Owens Shark, RN, BSN, Lauren    . ISCHEMIC COLITIS, HX OF 10/20/2008    Qualifier: Diagnosis of  By: Owens Shark, RN, BSN, Lauren    . S/P mitral valve replacement 02/01/1998    Carpentier-Edwards porcine bioprosthetic tissue valve, size 44mm Placed for acute and subacute bacterial endocarditis (group G Streptococcus) with pre-existing mitral valve prolapse   . HYPERTENSION, BENIGN 10/20/2008    Qualifier: Diagnosis of  By: Owens Shark, RN, BSN, Lauren    . Epidural hematoma ([place]) 03/01/2011    Recent fall 2012   . Severe mitral regurgitation 02/20/2013  . Iron deficiency anemia, unspecified   . S/P mitral valve replacement with bioprosthetic valve 03/24/2013    Redo mitral valve replacement using 34mm [facility] mitral bovine bioprosthetic tissue valve performed via right mini  thoracotomy  . Seizures ([place])   . Prostate cancer ([place])   . History of tobacco abuse   . Closed right acetabular fracture [facility])    Past Surgical History  Procedure Laterality Date  . Mitral valve replacement  02/01/1998    Carpentier-Edwards porcine bioprosthetic tissue valve, size 43mm,  placed for complicated bacterial endocarditis  . Video assisted thoracoscopy  04/29/2007    Left VATS w/ mini thoracotomy for Left Lower Lobectomy for benign lung nodules  . Tee without cardioversion N/A 02/12/2013    Procedure: TRANSESOPHAGEAL ECHOCARDIOGRAM (TEE);  Surgeon: Fay Records, MD;  Location: [facility] ENDOSCOPY;  Service: Cardiovascular;  Laterality: N/A;  . Mitral valve replacement Right 03/24/2013    Procedure: MINIMALLY INVASIVE REDO MITRAL VALVE (MV) REPLACEMENT;  Surgeon: Rexene Alberts, MD;  Location: [place];  Service: Open Heart Surgery;  Laterality: Right;  . Minimally invasive maze procedure N/A 03/24/2013    Procedure: MINIMALLY INVASIVE MAZE PROCEDURE;  Surgeon: Rexene Alberts, MD;  Location: [place];  Service: Open Heart Surgery;  Laterality: N/A;  . Intraoperative transesophageal echocardiogram N/A 03/24/2013    Procedure: INTRAOPERATIVE TRANSESOPHAGEAL ECHOCARDIOGRAM;  Surgeon: Rexene Alberts, MD;  Location: [place];  Service: Open Heart Surgery;  Laterality: N/A;  . Prostate biopsy     Social History:   reports that he quit smoking about 9 years ago. His smoking use included Cigarettes. He has a 90 pack-year smoking history. He quit smokeless tobacco use about 8 years ago. He reports that he does not drink alcohol or use illicit drugs.  Family History  Problem Relation Age of Onset  . Cancer Father     prostate cancer  . Tuberculosis Father   . Heart attack Father   . Cancer Brother     prostate cancer  . Lung disease Neg Hx     Medications:   Medication List       This list is accurate as of: 10/28/15  1:48 PM.  Always use your most recent med list.               ezetimibe 10 MG tablet  Commonly known as:  ZETIA  Take 1 tablet (10 mg total) by mouth daily.     methocarbamol 500 MG tablet  Commonly known as:  ROBAXIN  Take 500 mg by mouth 2 (two) times daily.     metoprolol tartrate 25 MG tablet  Commonly known as:  LOPRESSOR  TAKE 1/2 TABLET BY MOUTH  TWICE A DAY     oxyCODONE 5 MG immediate release tablet  Commonly known as:  Oxy IR/ROXICODONE  Take 1 tablet (5 mg total) by mouth every 6 (six) hours as needed for moderate pain or severe pain.     phenytoin 100 MG ER capsule  Commonly known as:  DILANTIN  Take 1 capsule (100 mg total) by mouth daily.     ramipril 5 MG capsule  Commonly known as:  ALTACE  TAKE ONE CAPSULE BY MOUTH EVERY DAY     SPIRIVA HANDIHALER 18 MCG inhalation capsule  Generic drug:  tiotropium  INHALE THE CONTENTS OF 1 CAPSULE EVERY DAY  VIA  HANDIHALER     Vitamin D3 1000 units Caps  Take 1 capsule by mouth daily.     zolpidem 5 MG tablet  Commonly known as:  AMBIEN  Take 5 mg by mouth at bedtime as needed for sleep. For insomnia.        Immunizations: Immunization History  Administered Date(s)  Administered  . Influenza Split 03/30/2012, 06/15/2015  . Influenza Whole 07/02/2011  . Influenza-Unspecified 03/30/2014, 06/30/2015  . PPD Test 10/26/2015  . Tdap 12/22/2012     Physical Exam: Filed Vitals:   10/28/15 1341  BP: 123/69  Pulse: 78  Temp: 98 F (36.7 C)  TempSrc: Oral  Resp: 16  Height: 5\' 9"  (1.753 m)  Weight: 155 lb (70.308 kg)  SpO2: 93%   Body mass index is 22.88 kg/(m^2).  General- elderly male, thin built, in no acute distress Head- normocephalic, atraumatic Nose- no maxillary or frontal sinus tenderness, no nasal discharge Throat- moist mucus membrane , wears dentures Eyes- PERRLA, EOMI, no pallor, no icterus, no discharge, normal conjunctiva, normal sclera Neck- no cervical lymphadenopathy Cardiovascular- normal s1,s2, no murmur, no leg edema Respiratory- bilateral poor air entry, no wheeze, no rhonchi, no crackles, no use of accessory muscles, on o2 Abdomen- bowel sounds present, soft, non tender Musculoskeletal- able to move all 4 extremities, limited right leg ROM at hip, generalized weakness, has scoliosis and kyphosis Neurological- alert and oriented to person,  place and time Skin- warm and dry Psychiatry- normal mood and affect    Labs reviewed: Basic Metabolic Panel:  Recent Labs  10/24/15 0746 10/25/15 0514 10/26/15 10/26/15 0454  NA 130* 130* 131* 131*  K 4.5 4.5  --  4.5  CL 97* 98*  --  97*  CO2 25 25  --  24  GLUCOSE 139* 128*  --  107*  BUN 9 7 9 9   CREATININE 0.90 0.84 0.9 0.92  CALCIUM 8.7* 8.3*  --  8.6*   Liver Function Tests:  Recent Labs  05/25/15 1304 10/24/15 0746  AST 16 27  ALT 13 29  ALKPHOS 118* 88  BILITOT 0.5 0.5  PROT 7.0 6.6  ALBUMIN 4.5 4.1    Recent Labs  10/24/15 0746  LIPASE 28   No results for input(s): AMMONIA in the last 8760 hours. CBC:  Recent Labs  05/25/15 1304  08/12/15 1500 10/24/15 0746 10/25/15 0514 10/26/15 10/26/15 0454  WBC 6.1  < > 5.9 10.7* 10.2 9.1 9.1  NEUTROABS 3.9  --  3.7 8.7*  --   --   --   HGB  --   < > 12.6* 11.8* 10.3*  --  9.1*  HCT 37.8  < > 37.6* 34.3* 29.5*  --  26.6*  MCV 96  < > 95.4 94.5 94.6  --  92.4  PLT 217  < > 178 209 209  --  193  < > = values in this interval not displayed.   Radiological Exams: Dg Chest 2 View  10/24/2015  CLINICAL DATA:  Shortness of breath.  Patient fell 3 days ago. EXAM: CHEST  2 VIEW COMPARISON:  CT 09/09/2015.  Radiographs 04/06/2013. FINDINGS: The heart size and mediastinal contours are stable status post median sternotomy and mitral valve replacement. Chronic volume loss and opacity inferiorly in the left hemithorax are grossly stable. There is stable mild atelectasis at the right lung base. No significant pleural effusion or pneumothorax identified. Numerous thoracic compression deformities are grossly stable. IMPRESSION: Stable postoperative chest with chronic findings in the left hemithorax. No acute findings seen. Electronically Signed   By: Richardean Sale M.D.   On: 10/24/2015 08:31   Ct Head Wo Contrast  10/24/2015  CLINICAL DATA:  The patient fell 1 week ago. The patient is on Coumadin. EXAM: CT HEAD WITHOUT  CONTRAST TECHNIQUE: Contiguous axial images were obtained from the base of  the skull through the vertex without intravenous contrast. COMPARISON:  CT scan dated 02/28/2014 and brain MR dated 07/14/2014 FINDINGS: There is no acute intracranial hemorrhage, acute infarction, or intracranial mass lesion. Diffuse slight atrophy. Slight periventricular white matter small vessel ischemic changes. Tiny old lacunar infarct in the head of the left caudate nucleus. Since the prior brain MR the patient is has developed extensive mucosal changes in the ethmoid and maxillary sinuses with air-fluid levels in both maxillary sinuses. There is also slight mucosal thickening in the base of the frontal sinus. IMPRESSION: No acute abnormality of the brain. Chronic small vessel ischemic changes. Possible acute on chronic sinusitis. Electronically Signed   By: Lorriane Shire M.D.   On: 10/24/2015 11:05   Ct Angio Pelvis W/cm &/or Wo/cm  10/24/2015  CLINICAL DATA:  Right hip pain after falling 1 week ago. Right acetabular fracture with pelvic hemorrhage on CT. Evaluate for vascular or bladder injury. EXAM: CT ANGIOGRAPHY OF PELVIS TECHNIQUE: Multidetector CT imaging of the pelvis was performed before and during bolus injection of intravenous contrast. Multiplanar CT angiographic image reconstructions including MIPs were generated to evaluate the vascular anatomy. CONTRAST:  100 ml Omnipaque 300. COMPARISON:  Abdominal pelvic CT performed earlier same date. FINDINGS: Urinary Tract: The ureters and bladder are filled with contrast from the preceding CT. No evidence of ureteral injury. The bladder demonstrates no evidence of urothelial lesion. As before, there is hemorrhage within the pre-vascular space. No evidence of leakage of contrast from the bladder lumen. Bowel: No bowel wall thickening, distention or surrounding inflammation identified within the pelvis. No evidence of bowel or mesenteric injury. Vascular/Lymphatic: Iliac artery  atherosclerosis noted. No evidence of active contrast extravasation. No adenopathy. Reproductive: Radioactive seeds are noted within the prostate gland. No acute findings. Other: Perivesical hemorrhage is again noted with hemorrhage extending into the perirectal fat. There is no significant free pelvic fluid. Small umbilical hernia containing only fat noted. Musculoskeletal: Again demonstrated is a comminuted and mildly displaced fracture involving the right acetabulum and superior pubic ramus. The right femoral head is intact. No acute left pelvic findings identified. There is an old posttraumatic deformity of the left pubic rami. The sacroiliac joints are intact. Review of the MIP images confirms the above findings. IMPRESSION: 1. No evidence of acute vascular injury or active extravasation into the pelvis. 2. Pelvic hemorrhage within the perivesical space. No significant free peritoneal fluid. No evidence of bladder or ureteral injury. 3. Comminuted right pelvic fractures involving the acetabulum. Electronically Signed   By: Richardean Sale M.D.   On: 10/24/2015 11:23   Ct Abdomen Pelvis W Contrast  10/24/2015  CLINICAL DATA:  Right groin pain with right leg swelling, unable to ambulate since March 26th at 11 a.m. History of prostate cancer status post radiation therapy completed in December of 2016. History of left lower lobectomy for pulmonary nodules. History of right middle lobe nodule. EXAM: CT ABDOMEN AND PELVIS WITH CONTRAST TECHNIQUE: Multidetector CT imaging of the abdomen and pelvis was performed using the standard protocol following bolus administration of intravenous contrast. CONTRAST:  173mL ISOVUE-300 IOPAMIDOL (ISOVUE-300) INJECTION 61% COMPARISON:  Chest CT dated 09/09/2015, CT abdomen dated 03/01/2005. FINDINGS: Lower chest: Lung bases difficult to characterize due to patient motion artifact. Small consolidation at the right base is new, most likely atelectasis. Stable dense consolidation the  left lung base is likely atelectasis. Hepatobiliary: No masses or other significant abnormality. Pancreas: No mass, inflammatory changes, or other significant abnormality. Spleen: Within normal limits in  size and appearance. Adrenals/Urinary Tract: Bilateral renal cysts Stomach/Bowel: Bowel is normal in caliber. No bowel wall thickening or evidence of bowel wall inflammation seen. Appendix is normal. Vascular/Lymphatic: Scattered atherosclerotic changes throughout the normal- caliber abdominal aorta and pelvic vasculature. No acute- appearing vascular abnormality identified. Reproductive: No acute findings. Other: Hyperdense free fluid within the pelvis, likely hemorrhage, small to moderate in amount, seen most prominently along the anterior and lateral margins of the bladder suggesting extraperitoneal hemorrhage. No active hemorrhage/contrast extravasation identified. Musculoskeletal: Displaced/comminuted fractures of the right acetabulum extending superiorly into the right iliac bone just anterior to the right sacroiliac joint space. There is involvement of the anterior and posterior acetabulum. There is medial displacement of the central acetabular fracture fragment with associated femoral head protrusio. No convincing evidence of pathologic fracture. No fracture identified within the right femoral head or right femoral neck. No additional fracture seen within the pelvis. There is an old healed fracture adjacent to the left symphysis pubis. There is a small periumbilical abdominal wall hernia which contains mesenteric fat only. Superficial soft tissues otherwise unremarkable. IMPRESSION: 1. Displaced/comminuted fractures of the right acetabulum, involving the anterior and posterior acetabulum, with upwards extension into the right iliac bone just anterior to the right SI joint. Medial displacement of the central acetabular fracture fragment with associated femoral head protrusio. No convincing evidence of  pathologic fracture. No fracture seen within the adjacent sacrum. SI joints remain normally aligned. No fracture seen within the right femoral head or right femoral neck. 2. Moderate amount of free fluid, presumed hemorrhage, within the extraperitoneal pelvis. Questionable small amount of intraperitoneal free fluid/hemorrhage immediately adjacent to the bladder. No active hemorrhage/contrast extravasation identified on this exam but would consider CT angiogram of the pelvis for more definitive exclusion of active hemorrhage and/or bladder disruption (contrast from this exam should be within the bladder on any subsequent imaging). 3. Additional chronic/incidental findings detailed above. These results and recommendations were called by telephone at the time of interpretation on 10/24/2015 at 9:30 am to Dr. Gareth Morgan , who verbally acknowledged these results. Electronically Signed   By: Franki Cabot M.D.   On: 10/24/2015 09:43   Dg Hip Unilat With Pelvis 2-3 Views Right  10/24/2015  CLINICAL DATA:  Status post fall March 24th with persistent pain involving the right hip with inability to bear weight. EXAM: DG HIP (WITH OR WITHOUT PELVIS) 2-3V RIGHT COMPARISON:  AP view of the abdomen of March 02, 2007. FINDINGS: The bones are osteopenic. The iliac bone appears preserved. The pubic rami are intact. There is subtle cortical irregularity on 1 of the AP views involving the medial wall of the acetabulum. There is mild narrowing of the right hip joint though this is less conspicuous than the narrowing on the left. The articular surface of the acetabulum and femoral head remains smoothly rounded. The femoral neck and intertrochanteric regions are normal. IMPRESSION: Possible minimally displaced fracture of the medial wall of the acetabulum. No definite fracture observed elsewhere. There is moderate degenerative joint space loss of the right hip. Incidentally noted is moderate to severe joint space loss on the left.  CT scanning of the right hip is recommended. Electronically Signed   By: David  Martinique M.D.   On: 10/24/2015 08:31    Assessment/Plan  Unsteady gait Post right acetabular fracture. Will have patient work with PT/OT as tolerated to regain strength and restore function.  Fall precautions are in place.  Right acetabular fracture Continue with conservative management. continue oxycodone  5 mg 1 tab q6h prn pain and robaxin 500 mg bid for muscle spasm. Has f/u with orthopedics. Continue vitamin d supplement. Continue coumadin for DVT prophylaxis  HTN Stable bp, monitor, continue lopressor and ramipril  Copd Breathing stable, wean off o2 as tolerated and continue spiriva  dysphagua Get SLP consult  Anemia check CBC  heartburn Start ranitidine 75 mg daily and monitor. Advised to sit upright after meals.   Hyponatremia check BMP  Seizure continue Dilantin 100 mg daily  History of mitral valve replacement  Continue couamdin with goal inr 2.5-3.5. inr today 1.8. Start coumadin 5 mg daily and check inr 10/31/15  afib Rate controlled. Continue lopressor and coumadin. Had supratherapeutic inr due to which coumadin has been on hold. inr today checked. Start coumadin as above  Long term anticoagulation Continue coumadin as above  Insomnia continue Ambien 5 mg daily as needed        Goals of care: short term rehabilitation   Labs/tests ordered:  Family/ staff Communication: reviewed care plan with patient and nursing supervisor    Blanchie Serve, MD Internal Medicine [place], [place] [address] Cell Phone (Monday-Friday 8 am - 5 pm): [phone] On Call: [phone] and follow prompts after 5 pm and on weekends Office Phone: [phone] Office Fax: [phone]

## 2015-10-31 ENCOUNTER — Non-Acute Institutional Stay: Payer: Commercial Managed Care - HMO | Admitting: Adult Health

## 2015-10-31 ENCOUNTER — Encounter: Payer: Self-pay | Admitting: Adult Health

## 2015-10-31 DIAGNOSIS — E871 Hypo-osmolality and hyponatremia: Secondary | ICD-10-CM | POA: Diagnosis not present

## 2015-10-31 DIAGNOSIS — I4891 Unspecified atrial fibrillation: Secondary | ICD-10-CM | POA: Diagnosis not present

## 2015-10-31 DIAGNOSIS — K5901 Slow transit constipation: Secondary | ICD-10-CM | POA: Diagnosis not present

## 2015-10-31 DIAGNOSIS — Z7901 Long term (current) use of anticoagulants: Secondary | ICD-10-CM

## 2015-10-31 NOTE — Progress Notes (Signed)
Patient ID: Jeffery Copeland, male   DOB: 1945/10/29, 70 y.o.   MRN: [id number]    DATE:    10/31/15  MRN:  [id number]  BIRTHDAY: [birth year]  Facility:  Nursing Home Location:  [place] Room Number: [id number]  LEVEL OF CARE:  SNF (31)  Contact Information    Name Relation Home Work [place] Sister [phone]     [place], [place] [phone]         Code Status History    Date Active Date Inactive Code Status Order ID Comments User Context   10/24/2015  6:11 PM 10/26/2015  7:18 PM Full Code [id number]  Geradine Girt, DO Inpatient   03/26/2013  8:17 AM 03/31/2013  3:27 PM Full Code [id number]  Rexene Alberts, MD Inpatient   03/24/2013  4:48 PM 03/26/2013  8:17 AM Full Code [id number]  Rexene Alberts, MD Inpatient    Advance Directive Documentation        Most Recent Value   Type of Advance Directive  Out of facility DNR (pink MOST or yellow form)   Pre-existing out of facility DNR order (yellow form or pink MOST form)     "MOST" Form in Place?       Chief Complaint  Patient presents with  . Acute Visit    Coumadin therapy, hyponatremia, constipation     HISTORY OF PRESENT ILLNESS:  This is a 70 year old male who is being seen due to  INR 1.3, subtherapeutic. He is currently taking Coumadin for his Atrial Fibrillation. He complained of having constipation and noted NA 130, low.   He has been admitted to [facility] on 10/26/15 from [facility] has PMH of esophageal reflux, hypertension, COPD, atrial fibrillation, anemia, seizure and prostate cancer. He complained of pain in the groin making him unable to ambulate. CT scan  showed a displaced/comminuted fractures of the right acetabulum, involving the anterior and posterior acetabulum, with upwards extension into the right iliac bone just anterior to the right SI joint. Orthopedic surgery was consulted who recommended conservative management.  He has been admitted for a  short-term rehabilitation.  PAST MEDICAL HISTORY:  Past Medical History  Diagnosis Date  . 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 Cerebrovascular disease, unspecified   . Esophageal reflux   . Generalized osteoarthrosis, unspecified site   . Hypertension   . COPD (chronic obstructive pulmonary disease) ([place]) 1999  . Lung nodules 2008, 2014    LLL nodule removed 2008 with LLL lobectomy,  RLL nodule 62mm observation  . Atrial fibrillation ([place]) 11/11/2008    Qualifier: Diagnosis of  By: Lia Foyer, MD, Jaquelyn Bitter   . CARDIOMYOPATHY 10/20/2008    Qualifier: Diagnosis of  By: Owens Shark, RN, BSN, Lauren    . ISCHEMIC COLITIS, HX OF 10/20/2008    Qualifier: Diagnosis of  By: Owens Shark, RN, BSN, Lauren    . S/P mitral valve replacement 02/01/1998    Carpentier-Edwards porcine bioprosthetic tissue valve, size 22mm Placed for acute and subacute bacterial endocarditis (group G Streptococcus) with pre-existing mitral valve prolapse   . HYPERTENSION, BENIGN 10/20/2008    Qualifier: Diagnosis of  By: Owens Shark, RN, BSN, Lauren    . Epidural hematoma ([place]) 03/01/2011    Recent fall 2012   . Severe mitral regurgitation 02/20/2013  . Iron deficiency anemia, unspecified   . S/P mitral valve replacement with bioprosthetic valve 03/24/2013    Redo  mitral valve replacement using 43mm Edwards Magna mitral bovine bioprosthetic tissue valve performed via right mini thoracotomy  . Seizures ([place])   . Prostate cancer ([place])   . History of tobacco abuse   . Closed right acetabular fracture ([place])   . Unsteady gait   . Hyponatremia   . Insomnia      CURRENT MEDICATIONS: Reviewed  Patient's Medications  New Prescriptions   No medications on file  Previous Medications   CHOLECALCIFEROL (VITAMIN D3) 1000 UNITS CAPS    Take 1 capsule by mouth daily.    EZETIMIBE (ZETIA) 10 MG TABLET    Take 1 tablet (10 mg total) by mouth daily.   METHOCARBAMOL (ROBAXIN) 500 MG TABLET    Take 500 mg by  mouth 2 (two) times daily.   METOPROLOL TARTRATE (LOPRESSOR) 25 MG TABLET    TAKE 1/2 TABLET BY MOUTH TWICE A DAY   PHENYTOIN (DILANTIN) 100 MG ER CAPSULE    Take 1 capsule (100 mg total) by mouth daily.   POLYETHYLENE GLYCOL (MIRALAX / GLYCOLAX) PACKET    Take 17 g by mouth. Give 17 grams BID x3 days, then change to once a day.   RAMIPRIL (ALTACE) 5 MG CAPSULE    TAKE ONE CAPSULE BY MOUTH EVERY DAY   RANITIDINE (ZANTAC) 75 MG TABLET    Take 75 mg by mouth daily.   SENNOSIDES-DOCUSATE SODIUM (SENOKOT-S) 8.6-50 MG TABLET    Take 2 tablets by mouth 2 (two) times daily.   SPIRIVA HANDIHALER 18 MCG INHALATION CAPSULE    INHALE THE CONTENTS OF 1 CAPSULE EVERY DAY  VIA  HANDIHALER   WARFARIN (COUMADIN) 6 MG TABLET    Take 6 mg by mouth daily.   ZOLPIDEM (AMBIEN) 5 MG TABLET    Take 5 mg by mouth at bedtime as needed for sleep. For insomnia.  Modified Medications   Modified Medication Previous Medication   OXYCODONE (OXY IR/ROXICODONE) 5 MG IMMEDIATE RELEASE TABLET oxyCODONE (OXY IR/ROXICODONE) 5 MG immediate release tablet      1 tablet by mouth every 4 hours as needed for moderate to severe pain    Take 1 tablet (5 mg total) by mouth every 6 (six) hours as needed for moderate pain or severe pain.  Discontinued Medications   MAGNESIUM HYDROXIDE (MILK OF MAGNESIA PO)    Take 45 mLs by mouth daily as needed. x2 days per S/O   WARFARIN (COUMADIN) 5 MG TABLET    Take 5 mg by mouth daily.     Allergies  Allergen Reactions  . Rosuvastatin Other (See Comments)    Muscle aches     REVIEW OF SYSTEMS:  GENERAL: no change in appetite, no fatigue, no weight changes, no fever, chills or weakness EYES: Denies change in vision, dry eyes, eye pain, itching or discharge EARS: Denies change in hearing, ringing in ears, or earache NOSE: Denies nasal congestion or epistaxis MOUTH and THROAT: Denies oral discomfort, gingival pain or bleeding, pain from teeth or hoarseness   RESPIRATORY: no cough, SOB, DOE,  wheezing, hemoptysis CARDIAC: no chest pain, edema or palpitations GI: no abdominal pain, diarrhea, heart burn, nausea or vomiting, + constipation GU: Denies dysuria, frequency, hematuria, incontinence, or discharge PSYCHIATRIC: Denies feeling of depression or anxiety. No report of hallucinations, insomnia, paranoia, or agitation    PHYSICAL EXAMINATION  GENERAL APPEARANCE: Well nourished. In no acute distress. Normal body habitus SKIN:  Skin is warm and dry.  HEAD: Normal in size and contour. No evidence of trauma  EYES: Lids open and close normally. No blepharitis, entropion or ectropion. PERRL. Conjunctivae are clear and sclerae are white. Lenses are without opacity EARS: Pinnae are normal. Patient hears normal voice tunes of the examiner MOUTH and THROAT: Lips are without lesions. Oral mucosa is moist and without lesions. Tongue is normal in shape, size, and color and without lesions NECK: supple, trachea midline, no neck masses, no thyroid tenderness, no thyromegaly LYMPHATICS: no LAN in the neck, no supraclavicular LAN RESPIRATORY: breathing is even & unlabored, BS CTAB CARDIAC: RRR, no murmur,no extra heart sounds, no edema GI: abdomen soft, normal BS, no masses, no tenderness, no hepatomegaly, no splenomegaly EXTREMITIES:  Able to move BUE and LLE; unable to move LLE PSYCHIATRIC: Alert and oriented X 3. Affect and behavior are appropriate  LABS/RADIOLOGY: Labs reviewed: Basic Metabolic Panel:  Recent Labs  10/24/15 0746 10/25/15 0514 10/26/15 10/26/15 0454 10/28/15  NA 130* 130* 131* 131* 130*  K 4.5 4.5  --  4.5 4.4  CL 97* 98*  --  97*  --   CO2 25 25  --  24  --   GLUCOSE 139* 128*  --  107*  --   BUN 9 7 9 9 12   CREATININE 0.90 0.84 0.9 0.92 0.9  CALCIUM 8.7* 8.3*  --  8.6*  --    Liver Function Tests:  Recent Labs  05/25/15 1304 10/24/15 0746  AST 16 27  ALT 13 29  ALKPHOS 118* 88  BILITOT 0.5 0.5  PROT 7.0 6.6  ALBUMIN 4.5 4.1    Recent Labs   10/24/15 0746  LIPASE 28   CBC:  Recent Labs  08/12/15 1500 10/24/15 0746 10/25/15 0514 10/26/15 10/26/15 0454 10/28/15  WBC 5.9 10.7* 10.2 9.1 9.1 9.1  NEUTROABS 3.7 8.7*  --   --   --  6  HGB 12.6* 11.8* 10.3*  --  9.1* 9.4*  HCT 37.6* 34.3* 29.5*  --  26.6* 28*  MCV 95.4 94.5 94.6  --  92.4  --   PLT 178 209 209  --  193 252    Lipid Panel:  Recent Labs  08/12/15 1500  HDL 51     Dg Chest 2 View  10/24/2015  CLINICAL DATA:  Shortness of breath.  Patient fell 3 days ago. EXAM: CHEST  2 VIEW COMPARISON:  CT 09/09/2015.  Radiographs 04/06/2013. FINDINGS: The heart size and mediastinal contours are stable status post median sternotomy and mitral valve replacement. Chronic volume loss and opacity inferiorly in the left hemithorax are grossly stable. There is stable mild atelectasis at the right lung base. No significant pleural effusion or pneumothorax identified. Numerous thoracic compression deformities are grossly stable. IMPRESSION: Stable postoperative chest with chronic findings in the left hemithorax. No acute findings seen. Electronically Signed   By: Richardean Sale M.D.   On: 10/24/2015 08:31   Ct Head Wo Contrast  10/24/2015  CLINICAL DATA:  The patient fell 1 week ago. The patient is on Coumadin. EXAM: CT HEAD WITHOUT CONTRAST TECHNIQUE: Contiguous axial images were obtained from the base of the skull through the vertex without intravenous contrast. COMPARISON:  CT scan dated 02/28/2014 and brain MR dated 07/14/2014 FINDINGS: There is no acute intracranial hemorrhage, acute infarction, or intracranial mass lesion. Diffuse slight atrophy. Slight periventricular white matter small vessel ischemic changes. Tiny old lacunar infarct in the head of the left caudate nucleus. Since the prior brain MR the patient is has developed extensive mucosal changes in the ethmoid and maxillary  sinuses with air-fluid levels in both maxillary sinuses. There is also slight mucosal thickening in  the base of the frontal sinus. IMPRESSION: No acute abnormality of the brain. Chronic small vessel ischemic changes. Possible acute on chronic sinusitis. Electronically Signed   By: Lorriane Shire M.D.   On: 10/24/2015 11:05   Ct Angio Pelvis W/cm &/or Wo/cm  10/24/2015  CLINICAL DATA:  Right hip pain after falling 1 week ago. Right acetabular fracture with pelvic hemorrhage on CT. Evaluate for vascular or bladder injury. EXAM: CT ANGIOGRAPHY OF PELVIS TECHNIQUE: Multidetector CT imaging of the pelvis was performed before and during bolus injection of intravenous contrast. Multiplanar CT angiographic image reconstructions including MIPs were generated to evaluate the vascular anatomy. CONTRAST:  100 ml Omnipaque 300. COMPARISON:  Abdominal pelvic CT performed earlier same date. FINDINGS: Urinary Tract: The ureters and bladder are filled with contrast from the preceding CT. No evidence of ureteral injury. The bladder demonstrates no evidence of urothelial lesion. As before, there is hemorrhage within the pre-vascular space. No evidence of leakage of contrast from the bladder lumen. Bowel: No bowel wall thickening, distention or surrounding inflammation identified within the pelvis. No evidence of bowel or mesenteric injury. Vascular/Lymphatic: Iliac artery atherosclerosis noted. No evidence of active contrast extravasation. No adenopathy. Reproductive: Radioactive seeds are noted within the prostate gland. No acute findings. Other: Perivesical hemorrhage is again noted with hemorrhage extending into the perirectal fat. There is no significant free pelvic fluid. Small umbilical hernia containing only fat noted. Musculoskeletal: Again demonstrated is a comminuted and mildly displaced fracture involving the right acetabulum and superior pubic ramus. The right femoral head is intact. No acute left pelvic findings identified. There is an old posttraumatic deformity of the left pubic rami. The sacroiliac joints are  intact. Review of the MIP images confirms the above findings. IMPRESSION: 1. No evidence of acute vascular injury or active extravasation into the pelvis. 2. Pelvic hemorrhage within the perivesical space. No significant free peritoneal fluid. No evidence of bladder or ureteral injury. 3. Comminuted right pelvic fractures involving the acetabulum. Electronically Signed   By: Richardean Sale M.D.   On: 10/24/2015 11:23   Ct Abdomen Pelvis W Contrast  10/24/2015  CLINICAL DATA:  Right groin pain with right leg swelling, unable to ambulate since March 26th at 11 a.m. History of prostate cancer status post radiation therapy completed in December of 2016. History of left lower lobectomy for pulmonary nodules. History of right middle lobe nodule. EXAM: CT ABDOMEN AND PELVIS WITH CONTRAST TECHNIQUE: Multidetector CT imaging of the abdomen and pelvis was performed using the standard protocol following bolus administration of intravenous contrast. CONTRAST:  180mL ISOVUE-300 IOPAMIDOL (ISOVUE-300) INJECTION 61% COMPARISON:  Chest CT dated 09/09/2015, CT abdomen dated 03/01/2005. FINDINGS: Lower chest: Lung bases difficult to characterize due to patient motion artifact. Small consolidation at the right base is new, most likely atelectasis. Stable dense consolidation the left lung base is likely atelectasis. Hepatobiliary: No masses or other significant abnormality. Pancreas: No mass, inflammatory changes, or other significant abnormality. Spleen: Within normal limits in size and appearance. Adrenals/Urinary Tract: Bilateral renal cysts Stomach/Bowel: Bowel is normal in caliber. No bowel wall thickening or evidence of bowel wall inflammation seen. Appendix is normal. Vascular/Lymphatic: Scattered atherosclerotic changes throughout the normal- caliber abdominal aorta and pelvic vasculature. No acute- appearing vascular abnormality identified. Reproductive: No acute findings. Other: Hyperdense free fluid within the pelvis,  likely hemorrhage, small to moderate in amount, seen most prominently along the anterior and  lateral margins of the bladder suggesting extraperitoneal hemorrhage. No active hemorrhage/contrast extravasation identified. Musculoskeletal: Displaced/comminuted fractures of the right acetabulum extending superiorly into the right iliac bone just anterior to the right sacroiliac joint space. There is involvement of the anterior and posterior acetabulum. There is medial displacement of the central acetabular fracture fragment with associated femoral head protrusio. No convincing evidence of pathologic fracture. No fracture identified within the right femoral head or right femoral neck. No additional fracture seen within the pelvis. There is an old healed fracture adjacent to the left symphysis pubis. There is a small periumbilical abdominal wall hernia which contains mesenteric fat only. Superficial soft tissues otherwise unremarkable. IMPRESSION: 1. Displaced/comminuted fractures of the right acetabulum, involving the anterior and posterior acetabulum, with upwards extension into the right iliac bone just anterior to the right SI joint. Medial displacement of the central acetabular fracture fragment with associated femoral head protrusio. No convincing evidence of pathologic fracture. No fracture seen within the adjacent sacrum. SI joints remain normally aligned. No fracture seen within the right femoral head or right femoral neck. 2. Moderate amount of free fluid, presumed hemorrhage, within the extraperitoneal pelvis. Questionable small amount of intraperitoneal free fluid/hemorrhage immediately adjacent to the bladder. No active hemorrhage/contrast extravasation identified on this exam but would consider CT angiogram of the pelvis for more definitive exclusion of active hemorrhage and/or bladder disruption (contrast from this exam should be within the bladder on any subsequent imaging). 3. Additional chronic/incidental  findings detailed above. These results and recommendations were called by telephone at the time of interpretation on 10/24/2015 at 9:30 am to Dr. Gareth Morgan , who verbally acknowledged these results. Electronically Signed   By: Franki Cabot M.D.   On: 10/24/2015 09:43   Dg Hip Unilat With Pelvis 2-3 Views Right  10/24/2015  CLINICAL DATA:  Status post fall March 24th with persistent pain involving the right hip with inability to bear weight. EXAM: DG HIP (WITH OR WITHOUT PELVIS) 2-3V RIGHT COMPARISON:  AP view of the abdomen of March 02, 2007. FINDINGS: The bones are osteopenic. The iliac bone appears preserved. The pubic rami are intact. There is subtle cortical irregularity on 1 of the AP views involving the medial wall of the acetabulum. There is mild narrowing of the right hip joint though this is less conspicuous than the narrowing on the left. The articular surface of the acetabulum and femoral head remains smoothly rounded. The femoral neck and intertrochanteric regions are normal. IMPRESSION: Possible minimally displaced fracture of the medial wall of the acetabulum. No definite fracture observed elsewhere. There is moderate degenerative joint space loss of the right hip. Incidentally noted is moderate to severe joint space loss on the left. CT scanning of the right hip is recommended. Electronically Signed   By: David  Martinique M.D.   On: 10/24/2015 08:31    ASSESSMENT/PLAN:  Hyponatremia - sodium 130; check BMP  Long-term use of anticoagulant - INR 1.3; increase Coumadin to 6 mg by mouth daily and 3 check INR on 11/03/15  Constipation - start senna S2 tabs by mouth twice a day and MiraLAX 17 g by mouth twice a day 3 days then daily  Atrial fibrillation - rate controlled; continue Elana Alm, NP [employer] [phone]

## 2015-11-01 ENCOUNTER — Other Ambulatory Visit: Payer: Self-pay

## 2015-11-01 MED ORDER — OXYCODONE HCL 5 MG PO TABS: ORAL_TABLET | ORAL | Status: DC

## 2015-11-01 NOTE — Telephone Encounter (Signed)
Rx faxed to [facility] @ [phone], phone number [phone]  

## 2015-11-03 ENCOUNTER — Non-Acute Institutional Stay: Payer: Commercial Managed Care - HMO | Admitting: Adult Health

## 2015-11-03 ENCOUNTER — Encounter: Payer: Self-pay | Admitting: Adult Health

## 2015-11-03 DIAGNOSIS — I4891 Unspecified atrial fibrillation: Secondary | ICD-10-CM

## 2015-11-03 DIAGNOSIS — Z7901 Long term (current) use of anticoagulants: Secondary | ICD-10-CM

## 2015-11-03 NOTE — Progress Notes (Signed)
Patient ID: Jeffery Copeland, male   DOB: [birth year], 70 y.o.   MRN: [id number] Subjective:     Indication: atrial fibrillation Bleeding signs/symptoms: None Thromboembolic signs/symptoms: None  Missed Coumadin doses: None Medication changes: no Dietary changes: no Bacterial/viral infection: no Other concerns: no  The following portions of the patient's history were reviewed and updated as appropriate: allergies, current medications, past family history, past medical history, past social history, past surgical history and problem list.  Review of Systems A comprehensive review of systems was negative.   Objective:    INR Today: 1.3 Current dose: Coumadin 6 mg daily     Assessment:    Subtherapeutic INR for goal of 2-3   Plan:    1. New dose: Give Coumadin 10 mg by mouth 1 today then Coumadin 7.5 mg by mouth daily   2. Next INR: 11/07/15

## 2015-11-07 ENCOUNTER — Encounter: Payer: Self-pay | Admitting: Neurology

## 2015-11-07 ENCOUNTER — Ambulatory Visit (INDEPENDENT_AMBULATORY_CARE_PROVIDER_SITE_OTHER): Payer: Commercial Managed Care - HMO | Admitting: Neurology

## 2015-11-07 VITALS — BP 112/65 | HR 75 | Ht 69.0 in

## 2015-11-07 DIAGNOSIS — R0989 Other specified symptoms and signs involving the circulatory and respiratory systems: Secondary | ICD-10-CM

## 2015-11-07 MED ORDER — PHENYTOIN SODIUM EXTENDED 100 MG PO CAPS: 300.0000 mg | ORAL_CAPSULE | Freq: Every day | ORAL | Status: DC
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
## 2015-11-07 NOTE — Patient Instructions (Signed)
I had a long discussion the patient with regards to his seizures and phenytoin dose which for unclear reason has been reduced to 100 mg at night. His phenytoin level on 10/24/15 was nondetectable and hence I feel is at increased risk for recurrent seizures and we need to increase the dose back to 300 mg daily. Continue warfarin for stroke prevention and strict control of hypertension with blood pressure goal below 130/90 and lipids with LDL cholesterol goal below 70 mg percent. He'll return for follow-up in 6 months with Ward Givens, NP or call earlier if necessary.

## 2015-11-14 DIAGNOSIS — M5126 Other intervertebral disc displacement, lumbar region: Secondary | ICD-10-CM | POA: Diagnosis not present

## 2015-11-14 DIAGNOSIS — M47816 Spondylosis without myelopathy or radiculopathy, lumbar region: Secondary | ICD-10-CM | POA: Diagnosis not present

## 2015-11-15 DIAGNOSIS — S32421D Displaced fracture of posterior wall of right acetabulum, subsequent encounter for fracture with routine healing: Secondary | ICD-10-CM | POA: Diagnosis not present

## 2015-11-15 DIAGNOSIS — R5381 Other malaise: Secondary | ICD-10-CM | POA: Diagnosis not present

## 2015-11-15 DIAGNOSIS — M25551 Pain in right hip: Secondary | ICD-10-CM | POA: Diagnosis not present

## 2015-11-15 DIAGNOSIS — S32401S Unspecified fracture of right acetabulum, sequela: Secondary | ICD-10-CM | POA: Diagnosis not present

## 2015-11-15 DIAGNOSIS — R262 Difficulty in walking, not elsewhere classified: Secondary | ICD-10-CM | POA: Diagnosis not present

## 2015-11-15 DIAGNOSIS — S32511D Fracture of superior rim of right pubis, subsequent encounter for fracture with routine healing: Secondary | ICD-10-CM | POA: Diagnosis not present

## 2015-11-16 DIAGNOSIS — R5381 Other malaise: Secondary | ICD-10-CM | POA: Diagnosis not present

## 2015-11-16 DIAGNOSIS — R262 Difficulty in walking, not elsewhere classified: Secondary | ICD-10-CM | POA: Diagnosis not present

## 2015-11-16 DIAGNOSIS — M25551 Pain in right hip: Secondary | ICD-10-CM | POA: Diagnosis not present

## 2015-11-16 DIAGNOSIS — S32401S Unspecified fracture of right acetabulum, sequela: Secondary | ICD-10-CM | POA: Diagnosis not present

## 2015-11-17 DIAGNOSIS — R262 Difficulty in walking, not elsewhere classified: Secondary | ICD-10-CM | POA: Diagnosis not present

## 2015-11-17 DIAGNOSIS — M25551 Pain in right hip: Secondary | ICD-10-CM | POA: Diagnosis not present

## 2015-11-17 DIAGNOSIS — S32401S Unspecified fracture of right acetabulum, sequela: Secondary | ICD-10-CM | POA: Diagnosis not present

## 2015-11-17 DIAGNOSIS — R5381 Other malaise: Secondary | ICD-10-CM | POA: Diagnosis not present

## 2015-11-18 DIAGNOSIS — M25551 Pain in right hip: Secondary | ICD-10-CM | POA: Diagnosis not present

## 2015-11-18 DIAGNOSIS — R262 Difficulty in walking, not elsewhere classified: Secondary | ICD-10-CM | POA: Diagnosis not present

## 2015-11-18 DIAGNOSIS — R5381 Other malaise: Secondary | ICD-10-CM | POA: Diagnosis not present

## 2015-11-18 DIAGNOSIS — S32401S Unspecified fracture of right acetabulum, sequela: Secondary | ICD-10-CM | POA: Diagnosis not present

## 2015-11-21 DIAGNOSIS — S32401S Unspecified fracture of right acetabulum, sequela: Secondary | ICD-10-CM | POA: Diagnosis not present

## 2015-11-21 DIAGNOSIS — R262 Difficulty in walking, not elsewhere classified: Secondary | ICD-10-CM | POA: Diagnosis not present

## 2015-11-21 DIAGNOSIS — M25551 Pain in right hip: Secondary | ICD-10-CM | POA: Diagnosis not present

## 2015-11-21 DIAGNOSIS — R5381 Other malaise: Secondary | ICD-10-CM | POA: Diagnosis not present

## 2015-11-22 DIAGNOSIS — M25551 Pain in right hip: Secondary | ICD-10-CM | POA: Diagnosis not present

## 2015-11-22 DIAGNOSIS — R5381 Other malaise: Secondary | ICD-10-CM | POA: Diagnosis not present

## 2015-11-22 DIAGNOSIS — S32401S Unspecified fracture of right acetabulum, sequela: Secondary | ICD-10-CM | POA: Diagnosis not present

## 2015-11-22 DIAGNOSIS — R262 Difficulty in walking, not elsewhere classified: Secondary | ICD-10-CM | POA: Diagnosis not present

## 2015-11-23 ENCOUNTER — Encounter: Payer: Self-pay | Admitting: Adult Health

## 2015-11-23 ENCOUNTER — Non-Acute Institutional Stay: Payer: Commercial Managed Care - HMO | Admitting: Adult Health

## 2015-11-23 DIAGNOSIS — R12 Heartburn: Secondary | ICD-10-CM | POA: Diagnosis not present

## 2015-11-23 DIAGNOSIS — E559 Vitamin D deficiency, unspecified: Secondary | ICD-10-CM | POA: Diagnosis not present

## 2015-11-23 DIAGNOSIS — E871 Hypo-osmolality and hyponatremia: Secondary | ICD-10-CM

## 2015-11-23 DIAGNOSIS — I4891 Unspecified atrial fibrillation: Secondary | ICD-10-CM | POA: Diagnosis not present

## 2015-11-23 DIAGNOSIS — G4700 Insomnia, unspecified: Secondary | ICD-10-CM

## 2015-11-23 DIAGNOSIS — I10 Essential (primary) hypertension: Secondary | ICD-10-CM | POA: Diagnosis not present

## 2015-11-23 DIAGNOSIS — D509 Iron deficiency anemia, unspecified: Secondary | ICD-10-CM

## 2015-11-23 DIAGNOSIS — G40909 Epilepsy, unspecified, not intractable, without status epilepticus: Secondary | ICD-10-CM | POA: Diagnosis not present

## 2015-11-23 DIAGNOSIS — J438 Other emphysema: Secondary | ICD-10-CM | POA: Diagnosis not present

## 2015-11-23 DIAGNOSIS — S32401S Unspecified fracture of right acetabulum, sequela: Secondary | ICD-10-CM

## 2015-11-23 NOTE — Progress Notes (Signed)
Patient ID: Jeffery Copeland, male   DOB: 07-19-1946, 70 y.o.   MRN: [id number]     DATE:  11/23/15  MRN:  [id number]  BIRTHDAY: 03/17/1946  Facility:  [facility] Location:  Jeffery Copeland and Jeffery Copeland Room Number: [id number]  LEVEL OF CARE:  SNF (31)  Contact Information    Name Relation Home Work Jeffery Copeland Sister [phone]     Jeffery, Copeland [phone]         Code Status History    Date Active Date Inactive Code Status Order ID Comments User Context   10/24/2015  6:11 PM 10/26/2015  7:18 PM Full Code [id number]  Jeffery Girt, DO Inpatient   03/26/2013  8:17 AM 03/31/2013  3:27 PM Full Code [id number]  Jeffery Alberts, MD Inpatient   03/24/2013  4:48 PM 03/26/2013  8:17 AM Full Code [id number]  Jeffery Alberts, MD Inpatient    Advance Directive Documentation        Most Recent Value   Type of Advance Directive  Out of facility DNR (pink MOST or yellow form)   Pre-existing out of facility DNR order (yellow form or pink MOST form)     "MOST" Form in Place?         Chief Complaint  Patient presents with  . Discharge Note    HISTORY OF PRESENT ILLNESS:  This is a 70 year old male who is for discharge home with Home health PT, OT, CNA, SW and Nursing. DME:  Tub transfer bench, standard wheelchair with elevating leg rests, cushion and anti-tippers.  He has been admitted to [facility] on 10/26/15 from [facility] has PMH of esophageal reflux, hypertension, COPD, atrial fibrillation, anemia, seizure and prostate cancer. He complained of pain in the groin making him unable to ambulate. CT scan  showed a displaced/comminuted fractures of the right acetabulum, involving the anterior and posterior acetabulum, with upwards extension into the right iliac bone just anterior to the right SI joint. Orthopedic surgery was consulted who recommended conservative management.  Patient was admitted to this facility for short-term rehabilitation after the  patient's recent hospitalization.  Patient has completed SNF rehabilitation and therapy has cleared the patient for discharge.   PAST MEDICAL HISTORY:  Past Medical History  Diagnosis Date  . 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 Cerebrovascular disease, unspecified   . Esophageal reflux   . Generalized osteoarthrosis, unspecified site   . Hypertension   . COPD (chronic obstructive pulmonary disease) ([place]) 1999  . Lung nodules 2008, 2014    LLL nodule removed 2008 with LLL lobectomy,  RLL nodule 68mm observation  . Atrial fibrillation (Jeffery Copeland) 11/11/2008    Qualifier: Diagnosis of  By: Jeffery Foyer, MD, Jeffery Copeland   . CARDIOMYOPATHY 10/20/2008    Qualifier: Diagnosis of  By: Jeffery Shark, RN, BSN, Jeffery Copeland    . ISCHEMIC COLITIS, HX OF 10/20/2008    Qualifier: Diagnosis of  By: Jeffery Shark, RN, BSN, Jeffery Copeland    . S/P mitral valve replacement 02/01/1998    Carpentier-Edwards porcine bioprosthetic tissue valve, size 47mm Placed for acute and subacute bacterial endocarditis (group G Streptococcus) with pre-existing mitral valve prolapse   . HYPERTENSION, BENIGN 10/20/2008    Qualifier: Diagnosis of  By: Jeffery Shark, RN, BSN, Jeffery Copeland    . Epidural hematoma (Jeffery Copeland) 03/01/2011    Recent fall 2012   . Severe mitral regurgitation 02/20/2013  . Iron deficiency anemia, unspecified   .  S/P mitral valve replacement with bioprosthetic valve 03/24/2013    Redo mitral valve replacement using 77mm [facility] mitral bovine bioprosthetic tissue valve performed via right mini thoracotomy  . Seizures (Jeffery Copeland)   . Prostate cancer (Jeffery Copeland)   . History of tobacco abuse   . Closed right acetabular fracture (Jeffery Copeland)   . Unsteady gait   . Hyponatremia   . Insomnia      CURRENT MEDICATIONS: Reviewed  Patient's Medications  New Prescriptions   No medications on file  Previous Medications   ACETAMINOPHEN (TYLENOL) 500 MG TABLET    Take 500 mg by mouth every 6 (six) hours as needed. Keep maximum dose of  acetaminophen from all sources less than 3000 mg   CHOLECALCIFEROL (VITAMIN D3) 1000 UNITS CAPS    Take 1 capsule by mouth daily.    EZETIMIBE (ZETIA) 10 MG TABLET    Take 1 tablet (10 mg total) by mouth daily.   LISINOPRIL (PRINIVIL,ZESTRIL) 20 MG TABLET    Take 20 mg by mouth daily.   METHOCARBAMOL (ROBAXIN) 500 MG TABLET    Take 500 mg by mouth 2 (two) times daily.   METOPROLOL TARTRATE (LOPRESSOR) 25 MG TABLET    TAKE 1/2 TABLET BY MOUTH TWICE A DAY   OXYCODONE (OXY IR/ROXICODONE) 5 MG IMMEDIATE RELEASE TABLET    1 tablet by mouth every 4 hours as needed for moderate to severe pain   PHENYTOIN (DILANTIN) 100 MG ER CAPSULE    Take 200 mg by mouth 2 (two) times daily.   POLYETHYLENE GLYCOL (MIRALAX / GLYCOLAX) PACKET    Take 17 g by mouth daily.    RANITIDINE (ZANTAC) 75 MG TABLET    Take 75 mg by mouth daily.   SENNOSIDES-DOCUSATE SODIUM (SENOKOT-S) 8.6-50 MG TABLET    Take 2 tablets by mouth 2 (two) times daily.   SPIRIVA HANDIHALER 18 MCG INHALATION CAPSULE    INHALE THE CONTENTS OF 1 CAPSULE EVERY DAY  VIA  HANDIHALER   WARFARIN (COUMADIN) 6 MG TABLET    Take 6 mg by mouth daily.   ZOLPIDEM (AMBIEN) 5 MG TABLET    Take 5 mg by mouth at bedtime as needed for sleep. For insomnia.  Modified Medications   No medications on file  Discontinued Medications   PHENYTOIN (DILANTIN) 100 MG ER CAPSULE    Take 3 capsules (300 mg total) by mouth at bedtime.   RAMIPRIL (ALTACE) 5 MG CAPSULE    TAKE ONE CAPSULE BY MOUTH EVERY DAY     Allergies  Allergen Reactions  . Rosuvastatin Other (See Comments)    Muscle aches     REVIEW OF SYSTEMS:  GENERAL: no change in appetite, no fatigue, no weight changes, no fever, chills or weakness EYES: Denies change in vision, dry eyes, eye pain, itching or discharge EARS: Denies change in hearing, ringing in ears, or earache NOSE: Denies nasal congestion or epistaxis MOUTH and THROAT: Denies oral discomfort, gingival pain or bleeding, pain from teeth or  hoarseness   RESPIRATORY: no cough, SOB, DOE, wheezing, hemoptysis CARDIAC: no chest pain, or palpitations GI: no abdominal pain, diarrhea, constipation, heart burn, nausea or vomiting GU: Denies dysuria, frequency, hematuria, incontinence, or discharge PSYCHIATRIC: Denies feeling of depression or anxiety. No report of hallucinations, insomnia, paranoia, or agitation    PHYSICAL EXAMINATION  GENERAL APPEARANCE: Well nourished. In no acute distress. Normal body habitus SKIN:  Skin is warm and dry.  HEAD: Normal in size and contour. No evidence of trauma EYES: Lids  open and close normally. No blepharitis, entropion or ectropion. PERRL. Conjunctivae are clear and sclerae are white. Lenses are without opacity EARS: Pinnae are normal. Patient hears normal voice tunes of the examiner MOUTH and THROAT: Lips are without lesions. Oral mucosa is moist and without lesions. Tongue is normal in shape, size, and color and without lesions NECK: supple, trachea midline, no neck masses, no thyroid tenderness, no thyromegaly LYMPHATICS: no LAN in the neck, no supraclavicular LAN RESPIRATORY: breathing is even & unlabored, BS CTAB CARDIAC: Irregularly irregular, no murmur,no extra heart sounds, BLE edema 1+ GI: abdomen soft, normal BS, no masses, no tenderness, no hepatomegaly, no splenomegaly EXTREMITIES:  Able to move X 4 extremities PSYCHIATRIC: Alert and oriented X 3. Affect and behavior are appropriate  LABS/RADIOLOGY: Labs reviewed: 11/18/15  Phenytoin 7.2  ;  X-ray of left ankle is negative for fracture WBC 6.1 hemoglobin 10.5 hematocrit 33.1 MCV 102.8 platelet 217 uric acid 5.8 11/09/15   x-ray of right foot is negative for fracture Basic Metabolic Panel:  Recent Labs  10/24/15 0746 10/25/15 0514 10/26/15 10/26/15 0454 10/28/15  NA 130* 130* 131* 131* 130*  K 4.5 4.5  --  4.5 4.4  CL 97* 98*  --  97*  --   CO2 25 25  --  24  --   GLUCOSE 139* 128*  --  107*  --   BUN 9 7 9 9 12    CREATININE 0.90 0.84 0.9 0.92 0.9  CALCIUM 8.7* 8.3*  --  8.6*  --    Liver Function Tests:  Recent Labs  05/25/15 1304 10/24/15 0746  AST 16 27  ALT 13 29  ALKPHOS 118* 88  BILITOT 0.5 0.5  PROT 7.0 6.6  ALBUMIN 4.5 4.1    Recent Labs  10/24/15 0746  LIPASE 28   CBC:  Recent Labs  08/12/15 1500 10/24/15 0746 10/25/15 0514 10/26/15 10/26/15 0454 10/28/15  WBC 5.9 10.7* 10.2 9.1 9.1 9.1  NEUTROABS 3.7 8.7*  --   --   --  6  HGB 12.6* 11.8* 10.3*  --  9.1* 9.4*  HCT 37.6* 34.3* 29.5*  --  26.6* 28*  MCV 95.4 94.5 94.6  --  92.4  --   PLT 178 209 209  --  193 252    Lipid Panel:  Recent Labs  08/12/15 1500  HDL 51     ASSESSMENT/PLAN:  Right acetabular fracture - orthopedic recommended conservative management; for Home health PT, OT, CNA, SW and Nursing; continue oxycodone 5 mg 1 tab by mouth every 6 hours when necessary and Tylenol 500 mg 1 tab PO Q 6 hours  for pain; Robaxin 500 mg 1 tab by mouth twice a day for muscle spasm; follow-up with orthopedic surgery, Dr Lorin Mercy  Anemia, iron deficiency - hemoglobin 9.1; re-check hgb 10.5, improved  Hyponatremia - sodium 131; re-check NA 135, improved  COPD - no SOB; continue Spiriva 18 g inhale 1 capsule daily via HandiHaler  Hypertension - continue Lisinopril 20 mg 1 tab PO daily and Lopressor 25 mg 1/2 tab = the 0.5 mg 1 tab by mouth twice a day  Seizure - continue Dilantin 100 mg 2 capsules = 200 mg by mouth BID  Vitamin D deficiency - continue vitamin D3 1000 units 1 capsule by mouth daily  Atrial fibrillation - continue Coumadin and Lopressor 25 mg 1/2 tab = 12.5 mg BID; re-check INR on 11/29/15  Insomnia - continue Ambien 5 mg 1 tab by mouth daily at  bedtime when necessary  Heartburn - stable; continue Ranitidine 75 mg 1 tab PO daily      I have filled out patient's discharge paperwork and written prescriptions.  Patient will receive home health PT, OT, SW, Nursing and CNA.  DME provided:  Tub  transfer bench, standard wheelchair with elevating leg rests, cushion and anti-tippers  Total discharge time: Greater than 30 minutes  Discharge time involved coordination of the discharge process with social worker, nursing staff and therapy department. Medical justification for home health services/DME verified.     [facility], NP [employer] [phone]

## 2015-11-24 DIAGNOSIS — R262 Difficulty in walking, not elsewhere classified: Secondary | ICD-10-CM | POA: Diagnosis not present

## 2015-11-24 DIAGNOSIS — S32401S Unspecified fracture of right acetabulum, sequela: Secondary | ICD-10-CM | POA: Diagnosis not present

## 2015-11-24 DIAGNOSIS — R5381 Other malaise: Secondary | ICD-10-CM | POA: Diagnosis not present

## 2015-11-24 DIAGNOSIS — M25551 Pain in right hip: Secondary | ICD-10-CM | POA: Diagnosis not present

## 2015-11-25 ENCOUNTER — Encounter: Payer: Self-pay | Admitting: Adult Health

## 2015-11-25 DIAGNOSIS — S32401S Unspecified fracture of right acetabulum, sequela: Secondary | ICD-10-CM | POA: Diagnosis not present

## 2015-11-25 DIAGNOSIS — R262 Difficulty in walking, not elsewhere classified: Secondary | ICD-10-CM | POA: Diagnosis not present

## 2015-11-25 DIAGNOSIS — M25551 Pain in right hip: Secondary | ICD-10-CM | POA: Diagnosis not present

## 2015-11-25 DIAGNOSIS — R5381 Other malaise: Secondary | ICD-10-CM | POA: Diagnosis not present

## 2015-11-25 NOTE — Progress Notes (Signed)
This encounter was created in error - please disregard.

## 2015-11-27 DIAGNOSIS — I509 Heart failure, unspecified: Secondary | ICD-10-CM | POA: Diagnosis not present

## 2015-11-27 DIAGNOSIS — I4891 Unspecified atrial fibrillation: Secondary | ICD-10-CM | POA: Diagnosis not present

## 2015-11-27 DIAGNOSIS — S32401D Unspecified fracture of right acetabulum, subsequent encounter for fracture with routine healing: Secondary | ICD-10-CM | POA: Diagnosis not present

## 2015-11-27 DIAGNOSIS — J449 Chronic obstructive pulmonary disease, unspecified: Secondary | ICD-10-CM | POA: Diagnosis not present

## 2015-11-27 DIAGNOSIS — Z8546 Personal history of malignant neoplasm of prostate: Secondary | ICD-10-CM | POA: Diagnosis not present

## 2015-11-27 DIAGNOSIS — K219 Gastro-esophageal reflux disease without esophagitis: Secondary | ICD-10-CM | POA: Diagnosis not present

## 2015-11-27 DIAGNOSIS — Z5181 Encounter for therapeutic drug level monitoring: Secondary | ICD-10-CM | POA: Diagnosis not present

## 2015-11-27 DIAGNOSIS — Z7901 Long term (current) use of anticoagulants: Secondary | ICD-10-CM | POA: Diagnosis not present

## 2015-11-27 DIAGNOSIS — I429 Cardiomyopathy, unspecified: Secondary | ICD-10-CM | POA: Diagnosis not present

## 2015-11-28 DIAGNOSIS — Z7901 Long term (current) use of anticoagulants: Secondary | ICD-10-CM | POA: Diagnosis not present

## 2015-11-28 DIAGNOSIS — J449 Chronic obstructive pulmonary disease, unspecified: Secondary | ICD-10-CM | POA: Diagnosis not present

## 2015-11-28 DIAGNOSIS — I429 Cardiomyopathy, unspecified: Secondary | ICD-10-CM | POA: Diagnosis not present

## 2015-11-28 DIAGNOSIS — S32401D Unspecified fracture of right acetabulum, subsequent encounter for fracture with routine healing: Secondary | ICD-10-CM | POA: Diagnosis not present

## 2015-11-28 DIAGNOSIS — I4891 Unspecified atrial fibrillation: Secondary | ICD-10-CM | POA: Diagnosis not present

## 2015-11-28 DIAGNOSIS — Z5181 Encounter for therapeutic drug level monitoring: Secondary | ICD-10-CM | POA: Diagnosis not present

## 2015-11-28 DIAGNOSIS — K219 Gastro-esophageal reflux disease without esophagitis: Secondary | ICD-10-CM | POA: Diagnosis not present

## 2015-11-28 DIAGNOSIS — S32401A Unspecified fracture of right acetabulum, initial encounter for closed fracture: Secondary | ICD-10-CM | POA: Diagnosis not present

## 2015-11-28 DIAGNOSIS — R262 Difficulty in walking, not elsewhere classified: Secondary | ICD-10-CM | POA: Diagnosis not present

## 2015-11-28 DIAGNOSIS — Z8546 Personal history of malignant neoplasm of prostate: Secondary | ICD-10-CM | POA: Diagnosis not present

## 2015-11-28 DIAGNOSIS — I509 Heart failure, unspecified: Secondary | ICD-10-CM | POA: Diagnosis not present

## 2015-11-28 DIAGNOSIS — M6281 Muscle weakness (generalized): Secondary | ICD-10-CM | POA: Diagnosis not present

## 2015-11-29 DIAGNOSIS — K219 Gastro-esophageal reflux disease without esophagitis: Secondary | ICD-10-CM | POA: Diagnosis not present

## 2015-11-29 DIAGNOSIS — I4891 Unspecified atrial fibrillation: Secondary | ICD-10-CM | POA: Diagnosis not present

## 2015-11-29 DIAGNOSIS — S32401D Unspecified fracture of right acetabulum, subsequent encounter for fracture with routine healing: Secondary | ICD-10-CM | POA: Diagnosis not present

## 2015-11-29 DIAGNOSIS — Z7901 Long term (current) use of anticoagulants: Secondary | ICD-10-CM | POA: Diagnosis not present

## 2015-11-29 DIAGNOSIS — J449 Chronic obstructive pulmonary disease, unspecified: Secondary | ICD-10-CM | POA: Diagnosis not present

## 2015-11-29 DIAGNOSIS — I509 Heart failure, unspecified: Secondary | ICD-10-CM | POA: Diagnosis not present

## 2015-11-29 DIAGNOSIS — I429 Cardiomyopathy, unspecified: Secondary | ICD-10-CM | POA: Diagnosis not present

## 2015-11-29 DIAGNOSIS — Z5181 Encounter for therapeutic drug level monitoring: Secondary | ICD-10-CM | POA: Diagnosis not present

## 2015-11-29 DIAGNOSIS — Z8546 Personal history of malignant neoplasm of prostate: Secondary | ICD-10-CM | POA: Diagnosis not present

## 2015-12-01 ENCOUNTER — Telehealth: Payer: Self-pay | Admitting: Neurology

## 2015-12-01 ENCOUNTER — Ambulatory Visit (INDEPENDENT_AMBULATORY_CARE_PROVIDER_SITE_OTHER): Payer: Commercial Managed Care - HMO | Admitting: Cardiology

## 2015-12-01 DIAGNOSIS — Z8546 Personal history of malignant neoplasm of prostate: Secondary | ICD-10-CM | POA: Diagnosis not present

## 2015-12-01 DIAGNOSIS — Z5181 Encounter for therapeutic drug level monitoring: Secondary | ICD-10-CM

## 2015-12-01 DIAGNOSIS — J449 Chronic obstructive pulmonary disease, unspecified: Secondary | ICD-10-CM | POA: Diagnosis not present

## 2015-12-01 DIAGNOSIS — I509 Heart failure, unspecified: Secondary | ICD-10-CM | POA: Diagnosis not present

## 2015-12-01 DIAGNOSIS — S32401D Unspecified fracture of right acetabulum, subsequent encounter for fracture with routine healing: Secondary | ICD-10-CM | POA: Diagnosis not present

## 2015-12-01 DIAGNOSIS — Z7901 Long term (current) use of anticoagulants: Secondary | ICD-10-CM | POA: Diagnosis not present

## 2015-12-01 DIAGNOSIS — I4891 Unspecified atrial fibrillation: Secondary | ICD-10-CM | POA: Diagnosis not present

## 2015-12-01 DIAGNOSIS — K219 Gastro-esophageal reflux disease without esophagitis: Secondary | ICD-10-CM | POA: Diagnosis not present

## 2015-12-01 DIAGNOSIS — I429 Cardiomyopathy, unspecified: Secondary | ICD-10-CM | POA: Diagnosis not present

## 2015-12-01 LAB — POCT INR: INR: 2.7

## 2015-12-01 NOTE — Telephone Encounter (Signed)
Patient called to advise, while in Nursing Home[facility] changed phenytoin (DILANTIN)  from 300 mg. To 200 mg., states "Dr. Leonie Man had increased to 300 mg and that no one was to change this medication but Dr. Leonie Man". Please call to advise [phone].

## 2015-12-01 NOTE — Telephone Encounter (Signed)
Rn call patient back at [facility]. Pt stated he saw Dr. Leonie Man in April and his dilantin was change to 300 mg daily. PT stated the [facility] place medical team change it back to 200mg . Pt stated Dr.[name] increase his dosage. Pt stated "Dr. Leonie Man told me no one is to change the dilantin dosage except him". Rn stated that a phone call will be made to [place] and medical team. Rn stated he will be given a call back once its clarified. Pt verbalized understanding.

## 2015-12-02 DIAGNOSIS — Z5181 Encounter for therapeutic drug level monitoring: Secondary | ICD-10-CM | POA: Diagnosis not present

## 2015-12-02 DIAGNOSIS — J449 Chronic obstructive pulmonary disease, unspecified: Secondary | ICD-10-CM | POA: Diagnosis not present

## 2015-12-02 DIAGNOSIS — Z7901 Long term (current) use of anticoagulants: Secondary | ICD-10-CM | POA: Diagnosis not present

## 2015-12-02 DIAGNOSIS — K219 Gastro-esophageal reflux disease without esophagitis: Secondary | ICD-10-CM | POA: Diagnosis not present

## 2015-12-02 DIAGNOSIS — S32401D Unspecified fracture of right acetabulum, subsequent encounter for fracture with routine healing: Secondary | ICD-10-CM | POA: Diagnosis not present

## 2015-12-02 DIAGNOSIS — Z8546 Personal history of malignant neoplasm of prostate: Secondary | ICD-10-CM | POA: Diagnosis not present

## 2015-12-02 DIAGNOSIS — I429 Cardiomyopathy, unspecified: Secondary | ICD-10-CM | POA: Diagnosis not present

## 2015-12-02 DIAGNOSIS — I4891 Unspecified atrial fibrillation: Secondary | ICD-10-CM | POA: Diagnosis not present

## 2015-12-02 DIAGNOSIS — I509 Heart failure, unspecified: Secondary | ICD-10-CM | POA: Diagnosis not present

## 2015-12-05 DIAGNOSIS — I4891 Unspecified atrial fibrillation: Secondary | ICD-10-CM | POA: Diagnosis not present

## 2015-12-05 DIAGNOSIS — S72001A Fracture of unspecified part of neck of right femur, initial encounter for closed fracture: Secondary | ICD-10-CM | POA: Diagnosis not present

## 2015-12-05 DIAGNOSIS — R569 Unspecified convulsions: Secondary | ICD-10-CM | POA: Diagnosis not present

## 2015-12-05 DIAGNOSIS — E784 Other hyperlipidemia: Secondary | ICD-10-CM | POA: Diagnosis not present

## 2015-12-05 DIAGNOSIS — I10 Essential (primary) hypertension: Secondary | ICD-10-CM | POA: Diagnosis not present

## 2015-12-05 DIAGNOSIS — S72001S Fracture of unspecified part of neck of right femur, sequela: Secondary | ICD-10-CM | POA: Diagnosis not present

## 2015-12-06 DIAGNOSIS — Z7901 Long term (current) use of anticoagulants: Secondary | ICD-10-CM | POA: Diagnosis not present

## 2015-12-06 DIAGNOSIS — Z5181 Encounter for therapeutic drug level monitoring: Secondary | ICD-10-CM | POA: Diagnosis not present

## 2015-12-06 DIAGNOSIS — S32401D Unspecified fracture of right acetabulum, subsequent encounter for fracture with routine healing: Secondary | ICD-10-CM | POA: Diagnosis not present

## 2015-12-06 DIAGNOSIS — Z8546 Personal history of malignant neoplasm of prostate: Secondary | ICD-10-CM | POA: Diagnosis not present

## 2015-12-06 DIAGNOSIS — J449 Chronic obstructive pulmonary disease, unspecified: Secondary | ICD-10-CM | POA: Diagnosis not present

## 2015-12-06 DIAGNOSIS — I429 Cardiomyopathy, unspecified: Secondary | ICD-10-CM | POA: Diagnosis not present

## 2015-12-06 DIAGNOSIS — I509 Heart failure, unspecified: Secondary | ICD-10-CM | POA: Diagnosis not present

## 2015-12-06 DIAGNOSIS — I4891 Unspecified atrial fibrillation: Secondary | ICD-10-CM | POA: Diagnosis not present

## 2015-12-06 DIAGNOSIS — K219 Gastro-esophageal reflux disease without esophagitis: Secondary | ICD-10-CM | POA: Diagnosis not present

## 2015-12-06 NOTE — Telephone Encounter (Signed)
Rn call patient back. Pt was discharge from [facility] on 11/25/2015.Pt is back at home now. Pt stated he was discharge with taking 200mg . Rn stated Dr.[name] can change him back to 300 mg daily and will send it to [employer] [facility].

## 2015-12-07 ENCOUNTER — Other Ambulatory Visit: Payer: Self-pay | Admitting: Neurology

## 2015-12-07 MED ORDER — PHENYTOIN SODIUM EXTENDED 100 MG PO CAPS: 300.0000 mg | ORAL_CAPSULE | Freq: Every day | ORAL | Status: DC

## 2015-12-07 NOTE — Telephone Encounter (Signed)
Ok I changed it in the medication list

## 2015-12-07 NOTE — Telephone Encounter (Signed)
Dilantin prescription fax to [facility] at  [phone]. Fax receive and confirm.Marland Kitchen

## 2015-12-07 NOTE — Telephone Encounter (Signed)
Rn call patient to let him know that Jeffery Copeland has the new prescription for 100 dilantin, and he is to take 3 pills at night. Pt stated he will call the pharmacy.

## 2015-12-08 ENCOUNTER — Ambulatory Visit (INDEPENDENT_AMBULATORY_CARE_PROVIDER_SITE_OTHER): Payer: Commercial Managed Care - HMO | Admitting: Cardiovascular Disease

## 2015-12-08 DIAGNOSIS — J449 Chronic obstructive pulmonary disease, unspecified: Secondary | ICD-10-CM | POA: Diagnosis not present

## 2015-12-08 DIAGNOSIS — Z7901 Long term (current) use of anticoagulants: Secondary | ICD-10-CM | POA: Diagnosis not present

## 2015-12-08 DIAGNOSIS — I429 Cardiomyopathy, unspecified: Secondary | ICD-10-CM | POA: Diagnosis not present

## 2015-12-08 DIAGNOSIS — I509 Heart failure, unspecified: Secondary | ICD-10-CM | POA: Diagnosis not present

## 2015-12-08 DIAGNOSIS — Z8546 Personal history of malignant neoplasm of prostate: Secondary | ICD-10-CM | POA: Diagnosis not present

## 2015-12-08 DIAGNOSIS — S32401D Unspecified fracture of right acetabulum, subsequent encounter for fracture with routine healing: Secondary | ICD-10-CM | POA: Diagnosis not present

## 2015-12-08 DIAGNOSIS — Z5181 Encounter for therapeutic drug level monitoring: Secondary | ICD-10-CM | POA: Diagnosis not present

## 2015-12-08 DIAGNOSIS — I4891 Unspecified atrial fibrillation: Secondary | ICD-10-CM | POA: Diagnosis not present

## 2015-12-08 DIAGNOSIS — K219 Gastro-esophageal reflux disease without esophagitis: Secondary | ICD-10-CM | POA: Diagnosis not present

## 2015-12-08 LAB — POCT INR: INR: 3.8

## 2015-12-09 ENCOUNTER — Telehealth: Payer: Self-pay | Admitting: Pharmacist

## 2015-12-09 DIAGNOSIS — I429 Cardiomyopathy, unspecified: Secondary | ICD-10-CM | POA: Diagnosis not present

## 2015-12-09 DIAGNOSIS — Z8546 Personal history of malignant neoplasm of prostate: Secondary | ICD-10-CM | POA: Diagnosis not present

## 2015-12-09 DIAGNOSIS — J449 Chronic obstructive pulmonary disease, unspecified: Secondary | ICD-10-CM | POA: Diagnosis not present

## 2015-12-09 DIAGNOSIS — Z5181 Encounter for therapeutic drug level monitoring: Secondary | ICD-10-CM | POA: Diagnosis not present

## 2015-12-09 DIAGNOSIS — K219 Gastro-esophageal reflux disease without esophagitis: Secondary | ICD-10-CM | POA: Diagnosis not present

## 2015-12-09 DIAGNOSIS — Z7901 Long term (current) use of anticoagulants: Secondary | ICD-10-CM | POA: Diagnosis not present

## 2015-12-09 DIAGNOSIS — S32401D Unspecified fracture of right acetabulum, subsequent encounter for fracture with routine healing: Secondary | ICD-10-CM | POA: Diagnosis not present

## 2015-12-09 DIAGNOSIS — I4891 Unspecified atrial fibrillation: Secondary | ICD-10-CM | POA: Diagnosis not present

## 2015-12-09 DIAGNOSIS — I509 Heart failure, unspecified: Secondary | ICD-10-CM | POA: Diagnosis not present

## 2015-12-09 MED ORDER — WARFARIN SODIUM 1 MG PO TABS: 1.0000 mg | ORAL_TABLET | Freq: Every day | ORAL | Status: DC

## 2015-12-09 NOTE — Telephone Encounter (Signed)
Received call from [place] with [facility].  She is at the home with the patient and checking his medications.  He has a lot of 5mg  warfarin tablets but none of the 6mg  tablets we have listed in the chart.  I asked Jeffery Copeland if he would like me to send in a new Rx for the 6mg  tablets or switch his dose to the 5mg  tablet and 1mg  tablet until he gets rid of all the 5mg  tablets.  He would like the 1mg  tablet.  Will send Rx to [facility] per pt's request.

## 2015-12-13 DIAGNOSIS — S32401D Unspecified fracture of right acetabulum, subsequent encounter for fracture with routine healing: Secondary | ICD-10-CM | POA: Diagnosis not present

## 2015-12-13 DIAGNOSIS — J449 Chronic obstructive pulmonary disease, unspecified: Secondary | ICD-10-CM | POA: Diagnosis not present

## 2015-12-13 DIAGNOSIS — K219 Gastro-esophageal reflux disease without esophagitis: Secondary | ICD-10-CM | POA: Diagnosis not present

## 2015-12-13 DIAGNOSIS — I509 Heart failure, unspecified: Secondary | ICD-10-CM | POA: Diagnosis not present

## 2015-12-13 DIAGNOSIS — Z7901 Long term (current) use of anticoagulants: Secondary | ICD-10-CM | POA: Diagnosis not present

## 2015-12-13 DIAGNOSIS — Z5181 Encounter for therapeutic drug level monitoring: Secondary | ICD-10-CM | POA: Diagnosis not present

## 2015-12-13 DIAGNOSIS — I4891 Unspecified atrial fibrillation: Secondary | ICD-10-CM | POA: Diagnosis not present

## 2015-12-13 DIAGNOSIS — I429 Cardiomyopathy, unspecified: Secondary | ICD-10-CM | POA: Diagnosis not present

## 2015-12-13 DIAGNOSIS — Z8546 Personal history of malignant neoplasm of prostate: Secondary | ICD-10-CM | POA: Diagnosis not present

## 2015-12-14 ENCOUNTER — Other Ambulatory Visit: Payer: Self-pay | Admitting: Adult Health

## 2015-12-15 ENCOUNTER — Ambulatory Visit (INDEPENDENT_AMBULATORY_CARE_PROVIDER_SITE_OTHER): Payer: Commercial Managed Care - HMO | Admitting: Cardiology

## 2015-12-15 DIAGNOSIS — I429 Cardiomyopathy, unspecified: Secondary | ICD-10-CM | POA: Diagnosis not present

## 2015-12-15 DIAGNOSIS — Z8546 Personal history of malignant neoplasm of prostate: Secondary | ICD-10-CM | POA: Diagnosis not present

## 2015-12-15 DIAGNOSIS — Z5181 Encounter for therapeutic drug level monitoring: Secondary | ICD-10-CM | POA: Diagnosis not present

## 2015-12-15 DIAGNOSIS — I4891 Unspecified atrial fibrillation: Secondary | ICD-10-CM

## 2015-12-15 DIAGNOSIS — I509 Heart failure, unspecified: Secondary | ICD-10-CM | POA: Diagnosis not present

## 2015-12-15 DIAGNOSIS — J449 Chronic obstructive pulmonary disease, unspecified: Secondary | ICD-10-CM | POA: Diagnosis not present

## 2015-12-15 DIAGNOSIS — Z7901 Long term (current) use of anticoagulants: Secondary | ICD-10-CM | POA: Diagnosis not present

## 2015-12-15 DIAGNOSIS — K219 Gastro-esophageal reflux disease without esophagitis: Secondary | ICD-10-CM | POA: Diagnosis not present

## 2015-12-15 DIAGNOSIS — S32401D Unspecified fracture of right acetabulum, subsequent encounter for fracture with routine healing: Secondary | ICD-10-CM | POA: Diagnosis not present

## 2015-12-15 LAB — POCT INR: INR: 3.4

## 2015-12-15 MED ORDER — WARFARIN SODIUM 6 MG PO TABS: 6.0000 mg | ORAL_TABLET | Freq: Every day | ORAL | Status: DC

## 2015-12-16 DIAGNOSIS — J449 Chronic obstructive pulmonary disease, unspecified: Secondary | ICD-10-CM | POA: Diagnosis not present

## 2015-12-16 DIAGNOSIS — S32401D Unspecified fracture of right acetabulum, subsequent encounter for fracture with routine healing: Secondary | ICD-10-CM | POA: Diagnosis not present

## 2015-12-16 DIAGNOSIS — I509 Heart failure, unspecified: Secondary | ICD-10-CM | POA: Diagnosis not present

## 2015-12-16 DIAGNOSIS — I429 Cardiomyopathy, unspecified: Secondary | ICD-10-CM | POA: Diagnosis not present

## 2015-12-16 DIAGNOSIS — K219 Gastro-esophageal reflux disease without esophagitis: Secondary | ICD-10-CM | POA: Diagnosis not present

## 2015-12-16 DIAGNOSIS — Z8546 Personal history of malignant neoplasm of prostate: Secondary | ICD-10-CM | POA: Diagnosis not present

## 2015-12-16 DIAGNOSIS — Z5181 Encounter for therapeutic drug level monitoring: Secondary | ICD-10-CM | POA: Diagnosis not present

## 2015-12-16 DIAGNOSIS — Z7901 Long term (current) use of anticoagulants: Secondary | ICD-10-CM | POA: Diagnosis not present

## 2015-12-16 DIAGNOSIS — I4891 Unspecified atrial fibrillation: Secondary | ICD-10-CM | POA: Diagnosis not present

## 2015-12-19 DIAGNOSIS — I4891 Unspecified atrial fibrillation: Secondary | ICD-10-CM | POA: Diagnosis not present

## 2015-12-19 DIAGNOSIS — S32401D Unspecified fracture of right acetabulum, subsequent encounter for fracture with routine healing: Secondary | ICD-10-CM | POA: Diagnosis not present

## 2015-12-19 DIAGNOSIS — Z8546 Personal history of malignant neoplasm of prostate: Secondary | ICD-10-CM | POA: Diagnosis not present

## 2015-12-19 DIAGNOSIS — J449 Chronic obstructive pulmonary disease, unspecified: Secondary | ICD-10-CM | POA: Diagnosis not present

## 2015-12-19 DIAGNOSIS — Z5181 Encounter for therapeutic drug level monitoring: Secondary | ICD-10-CM | POA: Diagnosis not present

## 2015-12-19 DIAGNOSIS — K219 Gastro-esophageal reflux disease without esophagitis: Secondary | ICD-10-CM | POA: Diagnosis not present

## 2015-12-19 DIAGNOSIS — I429 Cardiomyopathy, unspecified: Secondary | ICD-10-CM | POA: Diagnosis not present

## 2015-12-19 DIAGNOSIS — Z7901 Long term (current) use of anticoagulants: Secondary | ICD-10-CM | POA: Diagnosis not present

## 2015-12-19 DIAGNOSIS — I509 Heart failure, unspecified: Secondary | ICD-10-CM | POA: Diagnosis not present

## 2015-12-20 DIAGNOSIS — S32421D Displaced fracture of posterior wall of right acetabulum, subsequent encounter for fracture with routine healing: Secondary | ICD-10-CM | POA: Diagnosis not present

## 2015-12-21 DIAGNOSIS — Z7901 Long term (current) use of anticoagulants: Secondary | ICD-10-CM | POA: Diagnosis not present

## 2015-12-21 DIAGNOSIS — I429 Cardiomyopathy, unspecified: Secondary | ICD-10-CM | POA: Diagnosis not present

## 2015-12-21 DIAGNOSIS — I509 Heart failure, unspecified: Secondary | ICD-10-CM | POA: Diagnosis not present

## 2015-12-21 DIAGNOSIS — J449 Chronic obstructive pulmonary disease, unspecified: Secondary | ICD-10-CM | POA: Diagnosis not present

## 2015-12-21 DIAGNOSIS — Z8546 Personal history of malignant neoplasm of prostate: Secondary | ICD-10-CM | POA: Diagnosis not present

## 2015-12-21 DIAGNOSIS — Z5181 Encounter for therapeutic drug level monitoring: Secondary | ICD-10-CM | POA: Diagnosis not present

## 2015-12-21 DIAGNOSIS — K219 Gastro-esophageal reflux disease without esophagitis: Secondary | ICD-10-CM | POA: Diagnosis not present

## 2015-12-21 DIAGNOSIS — I4891 Unspecified atrial fibrillation: Secondary | ICD-10-CM | POA: Diagnosis not present

## 2015-12-21 DIAGNOSIS — S32401D Unspecified fracture of right acetabulum, subsequent encounter for fracture with routine healing: Secondary | ICD-10-CM | POA: Diagnosis not present

## 2015-12-22 ENCOUNTER — Ambulatory Visit (INDEPENDENT_AMBULATORY_CARE_PROVIDER_SITE_OTHER): Payer: Commercial Managed Care - HMO | Admitting: Cardiovascular Disease

## 2015-12-22 ENCOUNTER — Other Ambulatory Visit: Payer: Self-pay | Admitting: Pharmacist

## 2015-12-22 DIAGNOSIS — I4891 Unspecified atrial fibrillation: Secondary | ICD-10-CM | POA: Diagnosis not present

## 2015-12-22 DIAGNOSIS — Z5181 Encounter for therapeutic drug level monitoring: Secondary | ICD-10-CM

## 2015-12-22 DIAGNOSIS — I509 Heart failure, unspecified: Secondary | ICD-10-CM | POA: Diagnosis not present

## 2015-12-22 DIAGNOSIS — Z7901 Long term (current) use of anticoagulants: Secondary | ICD-10-CM | POA: Diagnosis not present

## 2015-12-22 DIAGNOSIS — S32401D Unspecified fracture of right acetabulum, subsequent encounter for fracture with routine healing: Secondary | ICD-10-CM | POA: Diagnosis not present

## 2015-12-22 DIAGNOSIS — I429 Cardiomyopathy, unspecified: Secondary | ICD-10-CM | POA: Diagnosis not present

## 2015-12-22 DIAGNOSIS — J449 Chronic obstructive pulmonary disease, unspecified: Secondary | ICD-10-CM | POA: Diagnosis not present

## 2015-12-22 DIAGNOSIS — Z8546 Personal history of malignant neoplasm of prostate: Secondary | ICD-10-CM | POA: Diagnosis not present

## 2015-12-22 DIAGNOSIS — K219 Gastro-esophageal reflux disease without esophagitis: Secondary | ICD-10-CM | POA: Diagnosis not present

## 2015-12-22 LAB — POCT INR: INR: 4.1

## 2015-12-22 MED ORDER — WARFARIN SODIUM 6 MG PO TABS: 6.0000 mg | ORAL_TABLET | Freq: Every day | ORAL | Status: DC

## 2015-12-27 ENCOUNTER — Inpatient Hospital Stay (HOSPITAL_COMMUNITY)
Admission: EM | Admit: 2015-12-27 | Discharge: 2016-01-03 | DRG: 100 | Disposition: A | Discharge status: Skilled Nursing Facility | Payer: Commercial Managed Care - HMO | Attending: Family Medicine | Admitting: Family Medicine

## 2015-12-27 ENCOUNTER — Emergency Department (HOSPITAL_COMMUNITY): Payer: Commercial Managed Care - HMO

## 2015-12-27 ENCOUNTER — Encounter (HOSPITAL_COMMUNITY): Payer: Self-pay | Admitting: Emergency Medicine

## 2015-12-27 DIAGNOSIS — Z953 Presence of xenogenic heart valve: Secondary | ICD-10-CM | POA: Diagnosis not present

## 2015-12-27 DIAGNOSIS — S299XXA Unspecified injury of thorax, initial encounter: Secondary | ICD-10-CM | POA: Diagnosis not present

## 2015-12-27 DIAGNOSIS — C61 Malignant neoplasm of prostate: Secondary | ICD-10-CM | POA: Diagnosis present

## 2015-12-27 DIAGNOSIS — F918 Other conduct disorders: Secondary | ICD-10-CM | POA: Diagnosis present

## 2015-12-27 DIAGNOSIS — G9340 Encephalopathy, unspecified: Secondary | ICD-10-CM | POA: Diagnosis not present

## 2015-12-27 DIAGNOSIS — E785 Hyperlipidemia, unspecified: Secondary | ICD-10-CM | POA: Diagnosis present

## 2015-12-27 DIAGNOSIS — I471 Supraventricular tachycardia: Secondary | ICD-10-CM | POA: Diagnosis not present

## 2015-12-27 DIAGNOSIS — Z23 Encounter for immunization: Secondary | ICD-10-CM

## 2015-12-27 DIAGNOSIS — I5032 Chronic diastolic (congestive) heart failure: Secondary | ICD-10-CM | POA: Diagnosis present

## 2015-12-27 DIAGNOSIS — D751 Secondary polycythemia: Secondary | ICD-10-CM | POA: Diagnosis not present

## 2015-12-27 DIAGNOSIS — R569 Unspecified convulsions: Secondary | ICD-10-CM | POA: Diagnosis present

## 2015-12-27 DIAGNOSIS — I472 Ventricular tachycardia: Secondary | ICD-10-CM | POA: Diagnosis not present

## 2015-12-27 DIAGNOSIS — R262 Difficulty in walking, not elsewhere classified: Secondary | ICD-10-CM | POA: Diagnosis not present

## 2015-12-27 DIAGNOSIS — J439 Emphysema, unspecified: Secondary | ICD-10-CM | POA: Diagnosis present

## 2015-12-27 DIAGNOSIS — Z87891 Personal history of nicotine dependence: Secondary | ICD-10-CM

## 2015-12-27 DIAGNOSIS — Z8249 Family history of ischemic heart disease and other diseases of the circulatory system: Secondary | ICD-10-CM

## 2015-12-27 DIAGNOSIS — T402X5A Adverse effect of other opioids, initial encounter: Secondary | ICD-10-CM | POA: Diagnosis present

## 2015-12-27 DIAGNOSIS — R4182 Altered mental status, unspecified: Secondary | ICD-10-CM | POA: Diagnosis not present

## 2015-12-27 DIAGNOSIS — I482 Chronic atrial fibrillation: Secondary | ICD-10-CM | POA: Diagnosis present

## 2015-12-27 DIAGNOSIS — T420X6A Underdosing of hydantoin derivatives, initial encounter: Secondary | ICD-10-CM | POA: Diagnosis present

## 2015-12-27 DIAGNOSIS — R339 Retention of urine, unspecified: Secondary | ICD-10-CM

## 2015-12-27 DIAGNOSIS — S32401A Unspecified fracture of right acetabulum, initial encounter for closed fracture: Secondary | ICD-10-CM | POA: Diagnosis not present

## 2015-12-27 DIAGNOSIS — E876 Hypokalemia: Secondary | ICD-10-CM | POA: Diagnosis not present

## 2015-12-27 DIAGNOSIS — Z8042 Family history of malignant neoplasm of prostate: Secondary | ICD-10-CM | POA: Diagnosis not present

## 2015-12-27 DIAGNOSIS — S0990XA Unspecified injury of head, initial encounter: Secondary | ICD-10-CM | POA: Diagnosis not present

## 2015-12-27 DIAGNOSIS — S32401D Unspecified fracture of right acetabulum, subsequent encounter for fracture with routine healing: Secondary | ICD-10-CM

## 2015-12-27 DIAGNOSIS — E871 Hypo-osmolality and hyponatremia: Secondary | ICD-10-CM | POA: Diagnosis present

## 2015-12-27 DIAGNOSIS — S199XXA Unspecified injury of neck, initial encounter: Secondary | ICD-10-CM | POA: Diagnosis not present

## 2015-12-27 DIAGNOSIS — Y92410 Unspecified street and highway as the place of occurrence of the external cause: Secondary | ICD-10-CM

## 2015-12-27 DIAGNOSIS — D696 Thrombocytopenia, unspecified: Secondary | ICD-10-CM | POA: Diagnosis not present

## 2015-12-27 DIAGNOSIS — Z781 Physical restraint status: Secondary | ICD-10-CM

## 2015-12-27 DIAGNOSIS — K5903 Drug induced constipation: Secondary | ICD-10-CM | POA: Diagnosis not present

## 2015-12-27 DIAGNOSIS — G40909 Epilepsy, unspecified, not intractable, without status epilepticus: Principal | ICD-10-CM | POA: Diagnosis present

## 2015-12-27 DIAGNOSIS — F1290 Cannabis use, unspecified, uncomplicated: Secondary | ICD-10-CM | POA: Diagnosis present

## 2015-12-27 DIAGNOSIS — I429 Cardiomyopathy, unspecified: Secondary | ICD-10-CM | POA: Diagnosis not present

## 2015-12-27 DIAGNOSIS — M159 Polyosteoarthritis, unspecified: Secondary | ICD-10-CM | POA: Diagnosis present

## 2015-12-27 DIAGNOSIS — J441 Chronic obstructive pulmonary disease with (acute) exacerbation: Secondary | ICD-10-CM | POA: Diagnosis not present

## 2015-12-27 DIAGNOSIS — R0789 Other chest pain: Secondary | ICD-10-CM | POA: Diagnosis not present

## 2015-12-27 DIAGNOSIS — R2681 Unsteadiness on feet: Secondary | ICD-10-CM | POA: Diagnosis not present

## 2015-12-27 DIAGNOSIS — S3991XA Unspecified injury of abdomen, initial encounter: Secondary | ICD-10-CM | POA: Diagnosis not present

## 2015-12-27 DIAGNOSIS — I484 Atypical atrial flutter: Secondary | ICD-10-CM | POA: Diagnosis not present

## 2015-12-27 DIAGNOSIS — N281 Cyst of kidney, acquired: Secondary | ICD-10-CM | POA: Diagnosis present

## 2015-12-27 DIAGNOSIS — R6889 Other general symptoms and signs: Secondary | ICD-10-CM | POA: Diagnosis not present

## 2015-12-27 DIAGNOSIS — I4891 Unspecified atrial fibrillation: Secondary | ICD-10-CM | POA: Diagnosis present

## 2015-12-27 DIAGNOSIS — Z888 Allergy status to other drugs, medicaments and biological substances status: Secondary | ICD-10-CM | POA: Diagnosis not present

## 2015-12-27 DIAGNOSIS — M6281 Muscle weakness (generalized): Secondary | ICD-10-CM | POA: Diagnosis not present

## 2015-12-27 DIAGNOSIS — Z9119 Patient's noncompliance with other medical treatment and regimen: Secondary | ICD-10-CM | POA: Diagnosis not present

## 2015-12-27 DIAGNOSIS — R269 Unspecified abnormalities of gait and mobility: Secondary | ICD-10-CM | POA: Diagnosis not present

## 2015-12-27 DIAGNOSIS — Z91128 Patient's intentional underdosing of medication regimen for other reason: Secondary | ICD-10-CM

## 2015-12-27 DIAGNOSIS — R402411 Glasgow coma scale score 13-15, in the field [EMT or ambulance]: Secondary | ICD-10-CM | POA: Diagnosis not present

## 2015-12-27 DIAGNOSIS — Z7901 Long term (current) use of anticoagulants: Secondary | ICD-10-CM | POA: Diagnosis not present

## 2015-12-27 DIAGNOSIS — I110 Hypertensive heart disease with heart failure: Secondary | ICD-10-CM | POA: Diagnosis present

## 2015-12-27 DIAGNOSIS — J438 Other emphysema: Secondary | ICD-10-CM | POA: Diagnosis not present

## 2015-12-27 DIAGNOSIS — M4856XA Collapsed vertebra, not elsewhere classified, lumbar region, initial encounter for fracture: Secondary | ICD-10-CM | POA: Diagnosis present

## 2015-12-27 DIAGNOSIS — Z923 Personal history of irradiation: Secondary | ICD-10-CM | POA: Diagnosis not present

## 2015-12-27 DIAGNOSIS — R0602 Shortness of breath: Secondary | ICD-10-CM

## 2015-12-27 DIAGNOSIS — Z9889 Other specified postprocedural states: Secondary | ICD-10-CM

## 2015-12-27 DIAGNOSIS — I10 Essential (primary) hypertension: Secondary | ICD-10-CM | POA: Diagnosis present

## 2015-12-27 DIAGNOSIS — I4892 Unspecified atrial flutter: Secondary | ICD-10-CM | POA: Diagnosis present

## 2015-12-27 DIAGNOSIS — R278 Other lack of coordination: Secondary | ICD-10-CM | POA: Diagnosis not present

## 2015-12-27 DIAGNOSIS — I517 Cardiomegaly: Secondary | ICD-10-CM | POA: Diagnosis not present

## 2015-12-27 DIAGNOSIS — Z5189 Encounter for other specified aftercare: Secondary | ICD-10-CM | POA: Diagnosis not present

## 2015-12-27 LAB — SAMPLE TO BLOOD BANK

## 2015-12-27 LAB — COMPREHENSIVE METABOLIC PANEL
ALT: 25 U/L (ref 17–63)
ANION GAP: 13 (ref 5–15)
AST: 29 U/L (ref 15–41)
Albumin: 3.9 g/dL (ref 3.5–5.0)
Alkaline Phosphatase: 133 U/L — ABNORMAL HIGH (ref 38–126)
BUN: 5 mg/dL — ABNORMAL LOW (ref 6–20)
CHLORIDE: 89 mmol/L — AB (ref 101–111)
CO2: 20 mmol/L — AB (ref 22–32)
CREATININE: 1.07 mg/dL (ref 0.61–1.24)
Calcium: 8.9 mg/dL (ref 8.9–10.3)
Glucose, Bld: 145 mg/dL — ABNORMAL HIGH (ref 65–99)
POTASSIUM: 3.7 mmol/L (ref 3.5–5.1)
SODIUM: 122 mmol/L — AB (ref 135–145)
Total Bilirubin: 1.1 mg/dL (ref 0.3–1.2)
Total Protein: 6.5 g/dL (ref 6.5–8.1)

## 2015-12-27 LAB — CBC
HCT: 35.5 % — ABNORMAL LOW (ref 39.0–52.0)
HEMOGLOBIN: 12 g/dL — AB (ref 13.0–17.0)
MCH: 31.1 pg (ref 26.0–34.0)
MCHC: 33.8 g/dL (ref 30.0–36.0)
MCV: 92 fL (ref 78.0–100.0)
PLATELETS: 175 10*3/uL (ref 150–400)
RBC: 3.86 MIL/uL — AB (ref 4.22–5.81)
RDW: 12.4 % (ref 11.5–15.5)
WBC: 16 10*3/uL — AB (ref 4.0–10.5)

## 2015-12-27 LAB — I-STAT TROPONIN, ED: Troponin i, poc: 0.01 ng/mL (ref 0.00–0.08)

## 2015-12-27 LAB — I-STAT CHEM 8, ED
BUN: 6 mg/dL (ref 6–20)
CALCIUM ION: 1.06 mmol/L — AB (ref 1.13–1.30)
CREATININE: 0.9 mg/dL (ref 0.61–1.24)
Chloride: 88 mmol/L — ABNORMAL LOW (ref 101–111)
GLUCOSE: 142 mg/dL — AB (ref 65–99)
HCT: 41 % (ref 39.0–52.0)
HEMOGLOBIN: 13.9 g/dL (ref 13.0–17.0)
Potassium: 3.8 mmol/L (ref 3.5–5.1)
Sodium: 123 mmol/L — ABNORMAL LOW (ref 135–145)
TCO2: 21 mmol/L (ref 0–100)

## 2015-12-27 LAB — I-STAT CG4 LACTIC ACID, ED: Lactic Acid, Venous: 2.66 mmol/L (ref 0.5–2.0)

## 2015-12-27 LAB — ETHANOL: Alcohol, Ethyl (B): <5 mg/dL (ref <5)

## 2015-12-27 LAB — PROTIME-INR
INR: 3.59 — AB (ref 0.00–1.49)
PROTHROMBIN TIME: 35 s — AB (ref 11.6–15.2)

## 2015-12-27 LAB — CBG MONITORING, ED: Glucose-Capillary: 154 mg/dL — ABNORMAL HIGH (ref 65–99)

## 2015-12-27 LAB — CDS SEROLOGY

## 2015-12-27 LAB — TROPONIN I: TROPONIN I: 0.03 ng/mL (ref <0.031)

## 2015-12-27 LAB — LACTIC ACID, PLASMA: LACTIC ACID, VENOUS: 1.8 mmol/L (ref 0.5–2.0)

## 2015-12-27 MED ORDER — ACETAMINOPHEN 650 MG RE SUPP: 650.0000 mg | Freq: Four times a day (QID) | RECTAL | Status: DC | PRN

## 2015-12-27 MED ORDER — IPRATROPIUM-ALBUTEROL 0.5-2.5 (3) MG/3ML IN SOLN: 3.0000 mL | Freq: Four times a day (QID) | RESPIRATORY_TRACT | Status: DC | PRN

## 2015-12-27 MED ORDER — WARFARIN - PHARMACIST DOSING INPATIENT
Freq: Every day | Status: DC
  Administered 2016-01-01: 18:00:00

## 2015-12-27 MED ORDER — LORAZEPAM 2 MG/ML IJ SOLN
INTRAMUSCULAR | Status: AC
  Administered 2015-12-27: 1 mg
  Filled 2015-12-27: qty 1

## 2015-12-27 MED ORDER — GI COCKTAIL ~~LOC~~
30.0000 mL | Freq: Once | ORAL | Status: DC
  Filled 2015-12-27: qty 30

## 2015-12-27 MED ORDER — LORAZEPAM 2 MG/ML IJ SOLN
1.0000 mg | Freq: Once | INTRAMUSCULAR | Status: AC
  Administered 2015-12-27: 1 mg via INTRAVENOUS
  Filled 2015-12-27: qty 1

## 2015-12-27 MED ORDER — TETANUS-DIPHTHERIA TOXOIDS TD 5-2 LFU IM INJ
0.5000 mL | INJECTION | Freq: Once | INTRAMUSCULAR | Status: AC
  Administered 2015-12-27: 0.5 mL via INTRAMUSCULAR
  Filled 2015-12-27: qty 0.5

## 2015-12-27 MED ORDER — POLYETHYLENE GLYCOL 3350 17 G PO PACK
17.0000 g | PACK | Freq: Every day | ORAL | Status: DC | PRN
  Administered 2016-01-02: 17 g via ORAL
  Filled 2015-12-27: qty 1

## 2015-12-27 MED ORDER — SODIUM CHLORIDE 0.9 % IV BOLUS (SEPSIS)
1000.0000 mL | Freq: Once | INTRAVENOUS | Status: AC
  Administered 2015-12-27: 1000 mL via INTRAVENOUS

## 2015-12-27 MED ORDER — WARFARIN SODIUM 3 MG PO TABS
3.0000 mg | ORAL_TABLET | Freq: Once | ORAL | Status: AC
  Administered 2015-12-27: 3 mg via ORAL
  Filled 2015-12-27 (×2): qty 1

## 2015-12-27 MED ORDER — LISINOPRIL 20 MG PO TABS
20.0000 mg | ORAL_TABLET | Freq: Every day | ORAL | Status: DC
  Administered 2015-12-27 – 2016-01-03 (×8): 20 mg via ORAL
  Filled 2015-12-27 (×8): qty 1

## 2015-12-27 MED ORDER — SODIUM CHLORIDE 0.9 % IV SOLN
1250.0000 mg | Freq: Once | INTRAVENOUS | Status: AC
  Administered 2015-12-27: 1250 mg via INTRAVENOUS
  Filled 2015-12-27: qty 25

## 2015-12-27 MED ORDER — TIOTROPIUM BROMIDE MONOHYDRATE 18 MCG IN CAPS
18.0000 ug | ORAL_CAPSULE | Freq: Every day | RESPIRATORY_TRACT | Status: DC
  Administered 2015-12-28 – 2016-01-03 (×4): 18 ug via RESPIRATORY_TRACT
  Filled 2015-12-27 (×3): qty 5

## 2015-12-27 MED ORDER — SODIUM CHLORIDE 0.9 % IV SOLN
INTRAVENOUS | Status: AC
  Administered 2015-12-27 – 2015-12-28 (×2): via INTRAVENOUS

## 2015-12-27 MED ORDER — METOPROLOL TARTRATE 12.5 MG HALF TABLET
12.5000 mg | ORAL_TABLET | Freq: Two times a day (BID) | ORAL | Status: DC
  Administered 2015-12-27 – 2016-01-03 (×14): 12.5 mg via ORAL
  Filled 2015-12-27 (×14): qty 1

## 2015-12-27 MED ORDER — SODIUM CHLORIDE 0.9 % IV SOLN
500.0000 mg | Freq: Two times a day (BID) | INTRAVENOUS | Status: DC
  Administered 2015-12-27 – 2015-12-28 (×2): 500 mg via INTRAVENOUS
  Filled 2015-12-27 (×4): qty 5

## 2015-12-27 MED ORDER — EZETIMIBE 10 MG PO TABS
10.0000 mg | ORAL_TABLET | Freq: Every day | ORAL | Status: DC
  Administered 2015-12-27 – 2016-01-03 (×8): 10 mg via ORAL
  Filled 2015-12-27 (×8): qty 1

## 2015-12-27 MED ORDER — ACETAMINOPHEN 325 MG PO TABS
650.0000 mg | ORAL_TABLET | Freq: Four times a day (QID) | ORAL | Status: DC | PRN
  Administered 2015-12-28 – 2015-12-29 (×5): 650 mg via ORAL
  Filled 2015-12-27 (×5): qty 2

## 2015-12-27 MED ORDER — IOPAMIDOL (ISOVUE-300) INJECTION 61%
INTRAVENOUS | Status: AC
  Administered 2015-12-27: 80 mL via INTRAVENOUS
  Filled 2015-12-27: qty 100

## 2015-12-27 NOTE — ED Provider Notes (Signed)
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 me loading him with Dilantin upon his arrival. Discussed the case with Dr. Silverio Decamp, will come and eval patient. Recommend step down admission.   The patients results and plan were reviewed and discussed.   Any x-rays performed were independently reviewed by myself.   Differential diagnosis were considered with the presenting HPI.  Medications  tetanus & diphtheria toxoids (adult) (TENIVAC) injection 0.5 mL (not administered)  sodium chloride 0.9 % bolus 1,000 mL (0 mLs Intravenous Stopped 12/27/15 1405)  iopamidol (ISOVUE-300) 61 % injection (80 mLs Intravenous Contrast Given 12/27/15 1343)  phenytoin (DILANTIN) 1,250 mg in sodium chloride 0.9 % 250 mL IVPB (0 mg Intravenous Stopped 12/27/15 1519)  LORazepam (ATIVAN) 2 MG/ML injection (1 mg  Given 12/27/15 1404)  LORazepam (ATIVAN) injection 1 mg (1 mg Intravenous Given 12/27/15 1503)    Filed Vitals:   12/27/15 1330 12/27/15 1400 12/27/15 1500 12/27/15 1515  BP: 117/75 115/61 140/71 115/61  Pulse:  102 104 120  Temp:      TempSrc:      Resp:  23 24 25   SpO2:  89% 79% 100%    Final diagnoses:  Seizure ([place])    Admission/ observation were discussed with the admitting physician, patient and/or family and they are comfortable with the  plan.      Deno Etienne, DO 12/27/15 1625

## 2015-12-27 NOTE — ED Notes (Signed)
Pt was found sitting on the end of his bed. Pt with eyes closed and not answering questions. Pt assisted to lay in bed. HR 120. Pts LOC continued to improve. Pt able to open eyes and follow commands. MD at bedside.

## 2015-12-27 NOTE — Progress Notes (Signed)
Pt is agitated and grimacing. Pt states he is having heart burn. His mentation is questionable. He is able to say who is,but does not answer any other questions. He has been following commands even though he has been agitated up to this point. MD called and GI cocktail order given. Pt refuses to to take GI cocktail at this time. Will continue to monitor.
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
## 2015-12-27 NOTE — Procedures (Signed)
History: Jeffery Copeland is an 70 y.o. male patient with altered mental status and seizures. Routine inpatient EEG was performed for further evaluation.   Patient Active Problem List   Diagnosis Date Noted  . Seizure ([place]) 12/27/2015  . Acetabular fracture, right, closed, initial encounter 10/24/2015  . Malignant neoplasm of prostate ([place]) 03/14/2015  . Ventral hernia without obstruction or gangrene 03/09/2014  . Umbilical hernia [id number]  . Encounter for therapeutic drug monitoring 08/20/2013  . Chronic diastolic CHF (congestive heart failure) ([place]) 07/31/2013  . 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 Pulmonary nodule, right 09/29/2012  . Dyspnea 09/26/2012  . Epidural hematoma ([place]) 03/01/2011  . Long term (current) use of anticoagulants 10/19/2010  . CAROTID ARTERY STENOSIS 05/26/2009  . ATRIAL FIBRILLATION 11/11/2008  . ATRIAL FLUTTER 11/11/2008  . ANEMIA, IRON DEFICIENCY 10/20/2008  . HYPERTENSION, BENIGN 10/20/2008  . CARDIOMYOPATHY 10/20/2008  . CEREBROVASCULAR DISEASE 10/20/2008  . 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 GERD 10/20/2008  . DEGENERATIVE JOINT DISEASE, GENERALIZED 10/20/2008  . ATRIAL FIBRILLATION, CHRONIC, HX OF 10/20/2008  . ISCHEMIC COLITIS, HX OF 10/20/2008  . MITRAL VALVE REPLACEMENT, HX OF 10/20/2008  . TOBACCO ABUSE, HX OF 10/20/2008  . S/P mitral valve replacement 02/01/1998     Current facility-administered medications:  .  0.9 %  sodium chloride infusion, , Intravenous, Continuous, Sela Hua, MD .  acetaminophen (TYLENOL) tablet 650 mg, 650 mg, Oral, Q6H PRN **OR** acetaminophen (TYLENOL) suppository 650 mg, 650 mg, Rectal, Q6H PRN, Sela Hua, MD .  ezetimibe (ZETIA) tablet 10 mg, 10 mg, Oral, Daily, Sela Hua, MD .  ipratropium-albuterol (DUONEB) 0.5-2.5 (3) MG/3ML nebulizer solution 3 mL, 3 mL, Nebulization, Q6H PRN, Sela Hua, MD .  levETIRAcetam (KEPPRA) 500 mg in  sodium chloride 0.9 % 100 mL IVPB, 500 mg, Intravenous, Q12H, Irine Heminger Fuller Mandril, MD .  lisinopril (PRINIVIL,ZESTRIL) tablet 20 mg, 20 mg, Oral, Daily, Sela Hua, MD .  metoprolol tartrate (LOPRESSOR) tablet 12.5 mg, 12.5 mg, Oral, BID, Sela Hua, MD .  polyethylene glycol (MIRALAX / GLYCOLAX) packet 17 g, 17 g, Oral, Daily PRN, Sela Hua, MD .  Derrill Memo ON 12/28/2015] tiotropium [facility]) inhalation capsule 18 mcg, 18 mcg, Inhalation, Daily, Sela Hua, MD .  warfarin (COUMADIN) tablet 3 mg, 3 mg, Oral, Once, Para March, RPH .  [START ON 12/28/2015] Warfarin - Pharmacist Dosing Inpatient, , Does not apply, q1800, Para March, RPH   Introduction:  This is a 19 channel routine scalp EEG performed at the bedside with bipolar and monopolar montages arranged in accordance to the international 10/20 system of electrode placement. One channel was dedicated to EKG recording.   Findings:  The background rhythm was normal 9-10 Hz alpha . Intermittent faster 12 Hz background frequencies were seen, commonly associated with benzodiazepine use. No definite evidence of abnormal epileptiform discharges or electrographic seizures were noted during this recording.   Impression:  Unremarkable routine inpatient EEG. No evidence of seizures noted during this EEG recording. Clinical correlation is recommended .

## 2015-12-27 NOTE — ED Notes (Signed)
Attempted report 

## 2015-12-27 NOTE — Progress Notes (Signed)
STAT EEG completed; results pending. Dr Silverio Decamp to read.
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
## 2015-12-27 NOTE — ED Notes (Signed)
Patient transported to CT 

## 2015-12-27 NOTE — ED Notes (Signed)
Pt had a seizure while in ct scan , pt placed on NRb and 1 mg ativan given ,
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
## 2015-12-28 ENCOUNTER — Observation Stay (HOSPITAL_COMMUNITY): Payer: Commercial Managed Care - HMO

## 2015-12-28 DIAGNOSIS — M4856XA Collapsed vertebra, not elsewhere classified, lumbar region, initial encounter for fracture: Secondary | ICD-10-CM | POA: Diagnosis present

## 2015-12-28 DIAGNOSIS — Z7901 Long term (current) use of anticoagulants: Secondary | ICD-10-CM | POA: Diagnosis not present

## 2015-12-28 DIAGNOSIS — G40909 Epilepsy, unspecified, not intractable, without status epilepticus: Secondary | ICD-10-CM | POA: Diagnosis present

## 2015-12-28 DIAGNOSIS — J438 Other emphysema: Secondary | ICD-10-CM | POA: Diagnosis not present

## 2015-12-28 DIAGNOSIS — I472 Ventricular tachycardia: Secondary | ICD-10-CM | POA: Diagnosis not present

## 2015-12-28 DIAGNOSIS — Y92410 Unspecified street and highway as the place of occurrence of the external cause: Secondary | ICD-10-CM | POA: Diagnosis not present

## 2015-12-28 DIAGNOSIS — I5032 Chronic diastolic (congestive) heart failure: Secondary | ICD-10-CM

## 2015-12-28 DIAGNOSIS — I110 Hypertensive heart disease with heart failure: Secondary | ICD-10-CM | POA: Diagnosis present

## 2015-12-28 DIAGNOSIS — R569 Unspecified convulsions: Secondary | ICD-10-CM

## 2015-12-28 DIAGNOSIS — I10 Essential (primary) hypertension: Secondary | ICD-10-CM

## 2015-12-28 DIAGNOSIS — G9340 Encephalopathy, unspecified: Secondary | ICD-10-CM | POA: Diagnosis present

## 2015-12-28 DIAGNOSIS — D696 Thrombocytopenia, unspecified: Secondary | ICD-10-CM | POA: Diagnosis not present

## 2015-12-28 DIAGNOSIS — I482 Chronic atrial fibrillation: Secondary | ICD-10-CM | POA: Diagnosis present

## 2015-12-28 DIAGNOSIS — R339 Retention of urine, unspecified: Secondary | ICD-10-CM | POA: Insufficient documentation

## 2015-12-28 DIAGNOSIS — R4182 Altered mental status, unspecified: Secondary | ICD-10-CM | POA: Diagnosis not present

## 2015-12-28 DIAGNOSIS — I4891 Unspecified atrial fibrillation: Secondary | ICD-10-CM

## 2015-12-28 DIAGNOSIS — I484 Atypical atrial flutter: Secondary | ICD-10-CM | POA: Diagnosis not present

## 2015-12-28 DIAGNOSIS — C61 Malignant neoplasm of prostate: Secondary | ICD-10-CM | POA: Diagnosis present

## 2015-12-28 DIAGNOSIS — Z9119 Patient's noncompliance with other medical treatment and regimen: Secondary | ICD-10-CM | POA: Diagnosis not present

## 2015-12-28 DIAGNOSIS — J441 Chronic obstructive pulmonary disease with (acute) exacerbation: Secondary | ICD-10-CM | POA: Diagnosis not present

## 2015-12-28 DIAGNOSIS — I471 Supraventricular tachycardia: Secondary | ICD-10-CM | POA: Diagnosis not present

## 2015-12-28 DIAGNOSIS — N281 Cyst of kidney, acquired: Secondary | ICD-10-CM | POA: Diagnosis not present

## 2015-12-28 DIAGNOSIS — Z923 Personal history of irradiation: Secondary | ICD-10-CM | POA: Diagnosis not present

## 2015-12-28 DIAGNOSIS — E871 Hypo-osmolality and hyponatremia: Secondary | ICD-10-CM | POA: Diagnosis present

## 2015-12-28 DIAGNOSIS — I429 Cardiomyopathy, unspecified: Secondary | ICD-10-CM | POA: Diagnosis present

## 2015-12-28 DIAGNOSIS — F918 Other conduct disorders: Secondary | ICD-10-CM | POA: Diagnosis present

## 2015-12-28 DIAGNOSIS — Z23 Encounter for immunization: Secondary | ICD-10-CM | POA: Diagnosis not present

## 2015-12-28 LAB — BASIC METABOLIC PANEL
Anion gap: 7 (ref 5–15)
CHLORIDE: 95 mmol/L — AB (ref 101–111)
CO2: 22 mmol/L (ref 22–32)
Calcium: 8.1 mg/dL — ABNORMAL LOW (ref 8.9–10.3)
Creatinine, Ser: 0.84 mg/dL (ref 0.61–1.24)
GFR calc Af Amer: >60 mL/min (ref >60)
GFR calc non Af Amer: >60 mL/min (ref >60)
Glucose, Bld: 124 mg/dL — ABNORMAL HIGH (ref 65–99)
POTASSIUM: 3.9 mmol/L (ref 3.5–5.1)
SODIUM: 124 mmol/L — AB (ref 135–145)

## 2015-12-28 LAB — PHENYTOIN LEVEL, TOTAL: Phenytoin Lvl: 16.7 ug/mL (ref 10.0–20.0)

## 2015-12-28 LAB — RAPID URINE DRUG SCREEN, HOSP PERFORMED
AMPHETAMINES: NOT DETECTED
BENZODIAZEPINES: POSITIVE — AB
Barbiturates: NOT DETECTED
COCAINE: NOT DETECTED
OPIATES: POSITIVE — AB
TETRAHYDROCANNABINOL: POSITIVE — AB

## 2015-12-28 LAB — CBC
HEMATOCRIT: 30.8 % — AB (ref 39.0–52.0)
HEMOGLOBIN: 10.4 g/dL — AB (ref 13.0–17.0)
MCH: 31.1 pg (ref 26.0–34.0)
MCHC: 33.8 g/dL (ref 30.0–36.0)
MCV: 92.2 fL (ref 78.0–100.0)
Platelets: 120 10*3/uL — ABNORMAL LOW (ref 150–400)
RBC: 3.34 MIL/uL — AB (ref 4.22–5.81)
RDW: 12.8 % (ref 11.5–15.5)
WBC: 11.9 10*3/uL — AB (ref 4.0–10.5)

## 2015-12-28 LAB — TROPONIN I: Troponin I: <0.03 ng/mL (ref <0.031)

## 2015-12-28 LAB — PROTIME-INR
INR: 5.58 (ref 0.00–1.49)
Prothrombin Time: 48.8 seconds — ABNORMAL HIGH (ref 11.6–15.2)

## 2015-12-28 LAB — MRSA PCR SCREENING: MRSA by PCR: NEGATIVE

## 2015-12-28 LAB — LACTIC ACID, PLASMA: Lactic Acid, Venous: 1 mmol/L (ref 0.5–2.0)

## 2015-12-28 MED ORDER — CYCLOBENZAPRINE HCL 5 MG PO TABS
5.0000 mg | ORAL_TABLET | Freq: Once | ORAL | Status: AC
  Administered 2015-12-28: 5 mg via ORAL
  Filled 2015-12-28: qty 1

## 2015-12-28 MED ORDER — TAMSULOSIN HCL 0.4 MG PO CAPS
0.4000 mg | ORAL_CAPSULE | Freq: Every day | ORAL | Status: DC
  Administered 2015-12-28 – 2016-01-03 (×7): 0.4 mg via ORAL
  Filled 2015-12-28 (×7): qty 1

## 2015-12-28 MED ORDER — LEVETIRACETAM 500 MG PO TABS
500.0000 mg | ORAL_TABLET | Freq: Two times a day (BID) | ORAL | Status: DC
  Administered 2015-12-28 – 2016-01-03 (×12): 500 mg via ORAL
  Filled 2015-12-28 (×12): qty 1

## 2015-12-28 MED ORDER — SODIUM CHLORIDE 0.9 % IV SOLN
INTRAVENOUS | Status: AC
  Administered 2015-12-28 (×2): via INTRAVENOUS

## 2015-12-28 NOTE — Progress Notes (Signed)
ANTICOAGULATION CONSULT NOTE - Follow Up Consult  Pharmacy Consult for Coumadin Indication: hx afib, bioprosthetic MVR  Allergies  Allergen Reactions  . Rosuvastatin Other (See Comments)    Muscle aches    Patient Measurements: Height: 5\' 9"  (175.3 cm) Weight: 113 lb 12.1 oz (51.6 kg) IBW/kg (Calculated) : 70.7  Vital Signs: Temp: 98.1 F (36.7 C) (05/31 1142) Temp Source: Axillary (05/31 1142) BP: 123/67 mmHg (05/31 1142) Pulse Rate: 78 (05/31 1142)  Labs:  Recent Labs  12/27/15 1222 12/27/15 1231 12/27/15 2110 12/28/15 0208  HGB 12.0* 13.9  --  10.4*  HCT 35.5* 41.0  --  30.8*  PLT 175  --   --  120*  LABPROT 35.0*  --   --  48.8*  INR 3.59*  --   --  5.58*  CREATININE 1.07 0.90  --  0.84  TROPONINI  --   --  0.03 <0.03    Estimated Creatinine Clearance: 60.6 mL/min (by C-G formula based on Cr of 0.84).   Medications:  Prescriptions prior to admission  Medication Sig Dispense Refill Last Dose  . Cholecalciferol (VITAMIN D3) 1000 UNITS CAPS Take 1 capsule by mouth daily.    12/26/2015 at Unknown time  . ezetimibe (ZETIA) 10 MG tablet Take 1 tablet (10 mg total) by mouth daily. 30 tablet 6 12/26/2015 at Unknown time  . HYDROcodone-acetaminophen (NORCO/VICODIN) 5-325 MG tablet Take 1 tablet by mouth every 4 (four) hours as needed for moderate pain.   0 12/27/2015 at Unknown time  . lisinopril (PRINIVIL,ZESTRIL) 20 MG tablet Take 20 mg by mouth daily.   12/26/2015 at Unknown time  . methocarbamol (ROBAXIN) 500 MG tablet Take 500 mg by mouth 2 (two) times daily.   12/26/2015 at Unknown time  . metoprolol tartrate (LOPRESSOR) 25 MG tablet TAKE 1/2 TABLET BY MOUTH TWICE A DAY 30 tablet 4 12/27/2015 at 0730  . oxyCODONE (OXY IR/ROXICODONE) 5 MG immediate release tablet 1 tablet by mouth every 4 hours as needed for moderate to severe pain 180 tablet 0 12/27/2015 at Unknown time  . phenytoin (DILANTIN) 100 MG ER capsule Take 3 capsules (300 mg total) by mouth at bedtime. 90  capsule 3 12/26/2015 at Unknown time  . polyethylene glycol (MIRALAX / GLYCOLAX) packet Take 17 g by mouth daily.    12/27/2015 at Unknown time  . sennosides-docusate sodium (SENOKOT-S) 8.6-50 MG tablet Take 2 tablets by mouth 2 (two) times daily.   Past Week at Unknown time  . SPIRIVA HANDIHALER 18 MCG inhalation capsule INHALE THE CONTENTS OF 1 CAPSULE EVERY DAY  VIA  HANDIHALER 90 capsule 0 12/27/2015 at Unknown time  . warfarin (COUMADIN) 6 MG tablet Take 1 tablet (6 mg total) by mouth daily. 15 tablet 0 12/26/2015 at Unknown time  . zolpidem (AMBIEN) 5 MG tablet Take 5 mg by mouth at bedtime as needed for sleep. For insomnia.   Past Week at Unknown time    Assessment: 70 yo M on Coumadin PTA for hx afib and bioprosthetic MVR.  INR is trending up in the setting of acute illness.  INR 3.59 > 5.58.  No bleeding noted.  Goal of Therapy:  INR 2.5-3.5 per [facility] notes Monitor platelets by anticoagulation protocol: Yes   Plan:  No Coumadin tonight (elevated INR) Continue daily INR  [employer], Pharm.D., BCPS Clinical Pharmacist Pager [phone] 12/28/2015 12:50 PM
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
## 2015-12-28 NOTE — Progress Notes (Signed)
Pt bladder scanned resulting in greater than 900 mls. Dr.Mikell of family medicine notified. Order given to in and out cath.

## 2015-12-28 NOTE — Progress Notes (Signed)
Pt has critical INR of 5.58. Dr.Karamalegos is informed and he states that pharmacy will follow up in the morning with coumadin therapy.
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
## 2015-12-28 NOTE — Progress Notes (Signed)
Patient unable to void. Bladder scan showing 519. Patient has been I&O cathed 2x since admission. Pt has not voided spontaneously since admission. MD called. Order received to place urinary catheter.  Peri-care performed prior to insertion of catheter. Urinary catheter placed. Patient tolerated procedure well. Amber colored urine returned.
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
## 2015-12-29 DIAGNOSIS — R4182 Altered mental status, unspecified: Secondary | ICD-10-CM

## 2015-12-29 LAB — TROPONIN I: Troponin I: 0.03 ng/mL (ref <0.031)

## 2015-12-29 LAB — COMPREHENSIVE METABOLIC PANEL
ALK PHOS: 88 U/L (ref 38–126)
ALT: 21 U/L (ref 17–63)
AST: 24 U/L (ref 15–41)
Albumin: 3 g/dL — ABNORMAL LOW (ref 3.5–5.0)
Anion gap: 8 (ref 5–15)
BILIRUBIN TOTAL: 0.8 mg/dL (ref 0.3–1.2)
CALCIUM: 8.2 mg/dL — AB (ref 8.9–10.3)
CHLORIDE: 98 mmol/L — AB (ref 101–111)
CO2: 21 mmol/L — ABNORMAL LOW (ref 22–32)
CREATININE: 0.87 mg/dL (ref 0.61–1.24)
Glucose, Bld: 104 mg/dL — ABNORMAL HIGH (ref 65–99)
Potassium: 3.7 mmol/L (ref 3.5–5.1)
Sodium: 127 mmol/L — ABNORMAL LOW (ref 135–145)
Total Protein: 5 g/dL — ABNORMAL LOW (ref 6.5–8.1)

## 2015-12-29 LAB — CBC
HEMATOCRIT: 29.2 % — AB (ref 39.0–52.0)
HEMATOCRIT: 30.2 % — AB (ref 39.0–52.0)
HEMOGLOBIN: 9.9 g/dL — AB (ref 13.0–17.0)
Hemoglobin: 10.2 g/dL — ABNORMAL LOW (ref 13.0–17.0)
MCH: 31.4 pg (ref 26.0–34.0)
MCH: 31.1 pg (ref 26.0–34.0)
MCHC: 33.9 g/dL (ref 30.0–36.0)
MCHC: 33.8 g/dL (ref 30.0–36.0)
MCV: 92.7 fL (ref 78.0–100.0)
MCV: 92.1 fL (ref 78.0–100.0)
PLATELETS: 88 10*3/uL — AB (ref 150–400)
PLATELETS: 99 10*3/uL — AB (ref 150–400)
RBC: 3.15 MIL/uL — AB (ref 4.22–5.81)
RBC: 3.28 MIL/uL — ABNORMAL LOW (ref 4.22–5.81)
RDW: 13.2 % (ref 11.5–15.5)
RDW: 12.9 % (ref 11.5–15.5)
WBC: 9.8 10*3/uL (ref 4.0–10.5)
WBC: 12.5 10*3/uL — ABNORMAL HIGH (ref 4.0–10.5)

## 2015-12-29 LAB — PROTIME-INR
INR: 5.72 (ref 0.00–1.49)
Prothrombin Time: 47.3 seconds — ABNORMAL HIGH (ref 11.6–15.2)

## 2015-12-29 LAB — MAGNESIUM: MAGNESIUM: 1.7 mg/dL (ref 1.7–2.4)

## 2015-12-29 MED ORDER — SODIUM CHLORIDE 0.9 % IV SOLN: INTRAVENOUS | Status: AC

## 2015-12-29 MED ORDER — AMOXICILLIN 500 MG PO CAPS
500.0000 mg | ORAL_CAPSULE | Freq: Three times a day (TID) | ORAL | Status: DC
  Administered 2015-12-29 – 2016-01-03 (×16): 500 mg via ORAL
  Filled 2015-12-29 (×18): qty 1

## 2015-12-29 MED ORDER — AZITHROMYCIN 500 MG PO TABS
500.0000 mg | ORAL_TABLET | Freq: Every day | ORAL | Status: DC
  Filled 2015-12-29: qty 1

## 2015-12-29 MED ORDER — SODIUM CHLORIDE 0.9 % IV SOLN
INTRAVENOUS | Status: AC
  Administered 2015-12-29 (×2): via INTRAVENOUS

## 2015-12-29 MED ORDER — AZITHROMYCIN 250 MG PO TABS: 250.0000 mg | ORAL_TABLET | Freq: Every day | ORAL | Status: DC

## 2015-12-29 MED ORDER — IPRATROPIUM-ALBUTEROL 0.5-2.5 (3) MG/3ML IN SOLN: 3.0000 mL | Freq: Four times a day (QID) | RESPIRATORY_TRACT | Status: DC

## 2015-12-29 MED ORDER — IPRATROPIUM-ALBUTEROL 0.5-2.5 (3) MG/3ML IN SOLN: 3.0000 mL | Freq: Four times a day (QID) | RESPIRATORY_TRACT | Status: DC | PRN

## 2015-12-29 MED ORDER — PREDNISONE 20 MG PO TABS
40.0000 mg | ORAL_TABLET | Freq: Every day | ORAL | Status: AC
Start: 2015-12-30 — End: 2016-01-03
  Administered 2015-12-30 – 2016-01-03 (×5): 40 mg via ORAL
  Filled 2015-12-29 (×6): qty 2

## 2015-12-29 MED ORDER — ALBUTEROL SULFATE (2.5 MG/3ML) 0.083% IN NEBU
2.5000 mg | INHALATION_SOLUTION | Freq: Four times a day (QID) | RESPIRATORY_TRACT | Status: DC | PRN
  Administered 2015-12-29: 2.5 mg via RESPIRATORY_TRACT
  Filled 2015-12-29: qty 3

## 2015-12-29 MED ORDER — OXYCODONE HCL 5 MG PO TABS
5.0000 mg | ORAL_TABLET | ORAL | Status: DC | PRN
  Administered 2015-12-29 – 2016-01-03 (×19): 5 mg via ORAL
  Filled 2015-12-29 (×21): qty 1

## 2015-12-29 NOTE — Progress Notes (Signed)
ANTICOAGULATION CONSULT NOTE - Follow Up Consult  Pharmacy Consult for Coumadin Indication: hx afib, bioprosthetic MVR  Allergies  Allergen Reactions  . Rosuvastatin Other (See Comments)    Muscle aches    Patient Measurements: Height: 5\' 9"  (175.3 cm) Weight: 113 lb 12.1 oz (51.6 kg) IBW/kg (Calculated) : 70.7  Vital Signs: Temp: 98.7 F (37.1 C) (06/01 0816) Temp Source: Oral (06/01 0816) BP: 129/67 mmHg (06/01 0816) Pulse Rate: 84 (06/01 0816)  Labs:  Recent Labs  12/27/15 1222 12/27/15 1231 12/27/15 2110 12/28/15 0208 12/29/15 0553  HGB 12.0* 13.9  --  10.4* 9.9*  HCT 35.5* 41.0  --  30.8* 29.2*  PLT 175  --   --  120* 88*  LABPROT 35.0*  --   --  48.8* 47.3*  INR 3.59*  --   --  5.58* 5.72*  CREATININE 1.07 0.90  --  0.84 0.87  TROPONINI  --   --  0.03 <0.03  --     Estimated Creatinine Clearance: 58.5 mL/min (by C-G formula based on Cr of 0.87).   Medications:  Prescriptions prior to admission  Medication Sig Dispense Refill Last Dose  . Cholecalciferol (VITAMIN D3) 1000 UNITS CAPS Take 1 capsule by mouth daily.    12/26/2015 at Unknown time  . ezetimibe (ZETIA) 10 MG tablet Take 1 tablet (10 mg total) by mouth daily. 30 tablet 6 12/26/2015 at Unknown time  . HYDROcodone-acetaminophen (NORCO/VICODIN) 5-325 MG tablet Take 1 tablet by mouth every 4 (four) hours as needed for moderate pain.   0 12/27/2015 at Unknown time  . lisinopril (PRINIVIL,ZESTRIL) 20 MG tablet Take 20 mg by mouth daily.   12/26/2015 at Unknown time  . methocarbamol (ROBAXIN) 500 MG tablet Take 500 mg by mouth 2 (two) times daily.   12/26/2015 at Unknown time  . metoprolol tartrate (LOPRESSOR) 25 MG tablet TAKE 1/2 TABLET BY MOUTH TWICE A DAY 30 tablet 4 12/27/2015 at 0730  . oxyCODONE (OXY IR/ROXICODONE) 5 MG immediate release tablet 1 tablet by mouth every 4 hours as needed for moderate to severe pain 180 tablet 0 12/27/2015 at Unknown time  . phenytoin (DILANTIN) 100 MG ER capsule Take 3  capsules (300 mg total) by mouth at bedtime. 90 capsule 3 12/26/2015 at Unknown time  . polyethylene glycol (MIRALAX / GLYCOLAX) packet Take 17 g by mouth daily.    12/27/2015 at Unknown time  . sennosides-docusate sodium (SENOKOT-S) 8.6-50 MG tablet Take 2 tablets by mouth 2 (two) times daily.   Past Week at Unknown time  . SPIRIVA HANDIHALER 18 MCG inhalation capsule INHALE THE CONTENTS OF 1 CAPSULE EVERY DAY  VIA  HANDIHALER 90 capsule 0 12/27/2015 at Unknown time  . warfarin (COUMADIN) 6 MG tablet Take 1 tablet (6 mg total) by mouth daily. 15 tablet 0 12/26/2015 at Unknown time  . zolpidem (AMBIEN) 5 MG tablet Take 5 mg by mouth at bedtime as needed for sleep. For insomnia.   Past Week at Unknown time    Assessment: 70 yo M on Coumadin PTA for hx afib and bioprosthetic MVR.  INR is trending up in the setting of acute illness and limited PO intake.  INR 3.59 > 5.58 > 5.72.  No bleeding noted.  PLTC has trended down 175 > 120 > 88 - no heparin exposure identified.  Goal of Therapy:  INR 2.5-3.5 per [facility] notes Monitor platelets by anticoagulation protocol: Yes   Plan:  No Coumadin tonight (elevated INR) Continue daily INR  Joelene Millin  Dailah Opperman, Pharm.D., BCPS Clinical Pharmacist Pager [phone] 12/29/2015 10:36 AM

## 2015-12-29 NOTE — Progress Notes (Signed)
Patient and patients family concerned regarding patients chest. Chest looks swollen and is tender between the nipple line. Tenderness and swelling has been the same since 7am. Notified MD with patients concern.  No orders received. Will continue to monitor.

## 2015-12-29 NOTE — Progress Notes (Signed)
@  0207 pt had assymmptomatic 7 beat run of VTach. Pt resting comfortably and VSS. MD on-call (Dr. Lajuana Ripple) for FMTS paged and orders received. Will continue to monitor.

## 2015-12-29 NOTE — Progress Notes (Signed)
Paged by RN that patient is having persistent chest pain. Pt states that he has had chest pain the whole time he has been in the hospital. The chest pain has not worsened. The chest pain feels "like it's just there". He says the chest pain feels like "pressure". When asked if the chest pain feels like someone is sitting on his chest, he says "yes". No diaphoresis, no nausea. No radiation of the pain.  On exam, chest is tender to palpation. Currently in A-fib with rate in the 80s.   Pt had a CT chest on admission that showed old fracture from sternotomy, but no new fractures.   Given that his chest pain is reproducible with palpation and the chest pain has not worsened throughout his whole hospitalization, I have a low suspicion that this is cardiac-related. However, because he says the pain feels like someone is sitting on his chest, order placed for stat EKG and troponin x 1 to rule out cardiac etiology of chest pain.  Hyman Bible, MD PGY-1
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
## 2015-12-29 NOTE — Progress Notes (Signed)
Received a call from lab regarding patients inr=5.72. Notified MD concerning result. No orders received. Will continue to monitor.

## 2015-12-30 ENCOUNTER — Inpatient Hospital Stay (HOSPITAL_COMMUNITY): Payer: Commercial Managed Care - HMO

## 2015-12-30 DIAGNOSIS — J438 Other emphysema: Secondary | ICD-10-CM

## 2015-12-30 LAB — BASIC METABOLIC PANEL
ANION GAP: 5 (ref 5–15)
BUN: <5 mg/dL — ABNORMAL LOW (ref 6–20)
CHLORIDE: 101 mmol/L (ref 101–111)
CO2: 24 mmol/L (ref 22–32)
Calcium: 8 mg/dL — ABNORMAL LOW (ref 8.9–10.3)
Creatinine, Ser: 0.62 mg/dL (ref 0.61–1.24)
GFR calc non Af Amer: >60 mL/min (ref >60)
Glucose, Bld: 113 mg/dL — ABNORMAL HIGH (ref 65–99)
POTASSIUM: 3.3 mmol/L — AB (ref 3.5–5.1)
Sodium: 130 mmol/L — ABNORMAL LOW (ref 135–145)

## 2015-12-30 LAB — HEMOGLOBIN AND HEMATOCRIT, BLOOD
HEMATOCRIT: 28.2 % — AB (ref 39.0–52.0)
HEMOGLOBIN: 9.5 g/dL — AB (ref 13.0–17.0)

## 2015-12-30 LAB — PROTIME-INR
INR: 3.89 — AB (ref 0.00–1.49)
Prothrombin Time: 37.2 seconds — ABNORMAL HIGH (ref 11.6–15.2)

## 2015-12-30 LAB — CBC
HEMATOCRIT: 26.2 % — AB (ref 39.0–52.0)
HEMOGLOBIN: 8.8 g/dL — AB (ref 13.0–17.0)
MCH: 31.4 pg (ref 26.0–34.0)
MCHC: 33.6 g/dL (ref 30.0–36.0)
MCV: 93.6 fL (ref 78.0–100.0)
Platelets: 95 10*3/uL — ABNORMAL LOW (ref 150–400)
RBC: 2.8 MIL/uL — ABNORMAL LOW (ref 4.22–5.81)
RDW: 13.1 % (ref 11.5–15.5)
WBC: 6.3 10*3/uL (ref 4.0–10.5)

## 2015-12-30 LAB — BRAIN NATRIURETIC PEPTIDE: B Natriuretic Peptide: 167.5 pg/mL — ABNORMAL HIGH (ref 0.0–100.0)

## 2015-12-30 LAB — MAGNESIUM: Magnesium: 1.7 mg/dL (ref 1.7–2.4)

## 2015-12-30 MED ORDER — DICLOFENAC SODIUM 1 % TD GEL
2.0000 g | Freq: Four times a day (QID) | TRANSDERMAL | Status: DC | PRN
  Administered 2016-01-03: 2 g via TOPICAL
  Filled 2015-12-30: qty 100

## 2015-12-30 MED ORDER — WARFARIN SODIUM 1 MG PO TABS
1.0000 mg | ORAL_TABLET | Freq: Once | ORAL | Status: AC
  Administered 2015-12-30: 1 mg via ORAL
  Filled 2015-12-30 (×2): qty 1

## 2015-12-30 MED ORDER — MAGNESIUM OXIDE 400 (241.3 MG) MG PO TABS
400.0000 mg | ORAL_TABLET | Freq: Once | ORAL | Status: AC
  Administered 2015-12-30: 400 mg via ORAL
  Filled 2015-12-30: qty 1

## 2015-12-30 MED ORDER — POTASSIUM CHLORIDE CRYS ER 20 MEQ PO TBCR
40.0000 meq | EXTENDED_RELEASE_TABLET | Freq: Once | ORAL | Status: AC
  Administered 2015-12-30: 40 meq via ORAL
  Filled 2015-12-30: qty 2

## 2015-12-30 MED ORDER — IPRATROPIUM-ALBUTEROL 0.5-2.5 (3) MG/3ML IN SOLN
3.0000 mL | Freq: Two times a day (BID) | RESPIRATORY_TRACT | Status: DC
  Administered 2015-12-31: 3 mL via RESPIRATORY_TRACT
  Filled 2015-12-30: qty 3

## 2015-12-30 MED ORDER — IPRATROPIUM-ALBUTEROL 0.5-2.5 (3) MG/3ML IN SOLN
3.0000 mL | Freq: Four times a day (QID) | RESPIRATORY_TRACT | Status: DC
  Administered 2015-12-30 (×2): 3 mL via RESPIRATORY_TRACT
  Filled 2015-12-30: qty 3

## 2015-12-30 NOTE — Progress Notes (Signed)
Asked by nursing to evaluate respiratory status.   Patient reports he does have increased work of breathing but overall improved from yesterday. Still reports chest soreness that has not changed from when he was first admitted. Sister at the bedside. Reports he lives with himself and usually makes medical decisions. Patient is agreeable to go to SNF.  On exam, vitals are stable. On 2.5L  with normal saturations. CTAB, good breath sounds, no wheezing.   Per chart review, patient has diastolic heart failure on problem list. Most recent ECHO 10/2014 with normal EF and GIDD.   - CXR with no significant changes from prior  - will get BNP and repeat weight  - will still proceed with transfer to floor
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
## 2015-12-30 NOTE — Progress Notes (Signed)
Spoke with Dr. [name] (over the phone), Mr. Medine Cardiologist, who reports that patient's INR goal should be between 2-3.

## 2015-12-30 NOTE — Clinical Social Work Note (Signed)
CSW met with patient to discuss SNF recommendation. Patient declined, stating that he had just left a SNF [employer] [employer]) and is more interested in home health PT. Patient lives in an apartment but does not have to use stairs to access it. Family is involved but he lives alone. When asked what he would do if he fell at home and no one was there, patient stated he would call an ambulance. CSW asked patient if he always had a phone on him and he stated that he did. CSW will notify RNCM.  CSW signing off. Consult if any other social work needs arise.  Jeffery Copeland, [place]

## 2015-12-30 NOTE — Progress Notes (Signed)
ANTICOAGULATION CONSULT NOTE - Follow Up Consult  Pharmacy Consult for Coumadin Indication: hx afib, bioprosthetic MVR  Allergies  Allergen Reactions  . Rosuvastatin Other (See Comments)    Muscle aches    Patient Measurements: Height: 5\' 9"  (175.3 cm) Weight: 113 lb 12.1 oz (51.6 kg) IBW/kg (Calculated) : 70.7  Vital Signs: Temp: 97.7 F (36.5 C) (06/02 1213) Temp Source: Oral (06/02 1213) BP: 132/65 mmHg (06/02 0912) Pulse Rate: 90 (06/02 0912)  Labs:  Recent Labs  12/27/15 2110 12/28/15 0208 12/29/15 0553 12/29/15 1448 12/29/15 1501 12/30/15 0556  HGB  --  10.4* 9.9*  --  10.2* 8.8*  HCT  --  30.8* 29.2*  --  30.2* 26.2*  PLT  --  120* 88*  --  99* 95*  LABPROT  --  48.8* 47.3*  --   --  37.2*  INR  --  5.58* 5.72*  --   --  3.89*  CREATININE  --  0.84 0.87  --   --  0.62  TROPONINI 0.03 <0.03  --  0.03  --   --     Estimated Creatinine Clearance: 63.6 mL/min (by C-G formula based on Cr of 0.62).   Medications:  Prescriptions prior to admission  Medication Sig Dispense Refill Last Dose  . Cholecalciferol (VITAMIN D3) 1000 UNITS CAPS Take 1 capsule by mouth daily.    12/26/2015 at Unknown time  . ezetimibe (ZETIA) 10 MG tablet Take 1 tablet (10 mg total) by mouth daily. 30 tablet 6 12/26/2015 at Unknown time  . HYDROcodone-acetaminophen (NORCO/VICODIN) 5-325 MG tablet Take 1 tablet by mouth every 4 (four) hours as needed for moderate pain.   0 12/27/2015 at Unknown time  . lisinopril (PRINIVIL,ZESTRIL) 20 MG tablet Take 20 mg by mouth daily.   12/26/2015 at Unknown time  . methocarbamol (ROBAXIN) 500 MG tablet Take 500 mg by mouth 2 (two) times daily.   12/26/2015 at Unknown time  . metoprolol tartrate (LOPRESSOR) 25 MG tablet TAKE 1/2 TABLET BY MOUTH TWICE A DAY 30 tablet 4 12/27/2015 at 0730  . oxyCODONE (OXY IR/ROXICODONE) 5 MG immediate release tablet 1 tablet by mouth every 4 hours as needed for moderate to severe pain 180 tablet 0 12/27/2015 at Unknown time  .  phenytoin (DILANTIN) 100 MG ER capsule Take 3 capsules (300 mg total) by mouth at bedtime. 90 capsule 3 12/26/2015 at Unknown time  . polyethylene glycol (MIRALAX / GLYCOLAX) packet Take 17 g by mouth daily.    12/27/2015 at Unknown time  . sennosides-docusate sodium (SENOKOT-S) 8.6-50 MG tablet Take 2 tablets by mouth 2 (two) times daily.   Past Week at Unknown time  . SPIRIVA HANDIHALER 18 MCG inhalation capsule INHALE THE CONTENTS OF 1 CAPSULE EVERY DAY  VIA  HANDIHALER 90 capsule 0 12/27/2015 at Unknown time  . warfarin (COUMADIN) 6 MG tablet Take 1 tablet (6 mg total) by mouth daily. 15 tablet 0 12/26/2015 at Unknown time  . zolpidem (AMBIEN) 5 MG tablet Take 5 mg by mouth at bedtime as needed for sleep. For insomnia.   Past Week at Unknown time    Assessment: 70 yo M on Coumadin PTA for hx afib and bioprosthetic MVR.  INR is trending up in the setting of acute illness and limited PO intake.  INR 3.59 > 5.58 after 3mg  x1> 5.72 > 3.89 with holding warfarin.  No bleeding noted.  PLTC has trended down 175 > 120 > 88> 90s - no heparin exposure identified. H/h  also fell 10/30 > 8.8/26 watch   Goal of Therapy:  INR 2.5-3.5 per [facility] notes Monitor platelets by anticoagulation protocol: Yes   Plan: Warfarin 1mg  x1 Continue daily INR  Bonnita Nasuti Pharm.D. CPP, BCPS Clinical Pharmacist [phone] 12/30/2015 1:02 PM

## 2015-12-30 NOTE — Progress Notes (Signed)
Jeffery Copeland [id number] Admission Data: 12/30/2015 5:37 PM Attending Provider: Lupita Dawn, MD  [id number] N, MD Consults/ Treatment Team:    Jeffery Copeland is a 70 y.o. male patient admitted from ED awake, alert  & orientated  X 3,  Full Code, VSS - Blood pressure 115/79, pulse 90, temperature 98.7 F (37.1 C), temperature source Oral, resp. rate 18, height 5\' 9"  (1.753 m), weight 51.6 kg (113 lb 12.1 oz), SpO2 92 %., O2   1 L nasal cannular, no c/o shortness of breath, no c/o chest pain, no distress noted. Tele # [id number] placed.   IV site WDL:  SL at this time.  Allergies:   Allergies  Allergen Reactions  . Rosuvastatin Other (See Comments)    Muscle aches     Past Medical History  Diagnosis Date  . 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 Cerebrovascular disease, unspecified   . Esophageal reflux   . Generalized osteoarthrosis, unspecified site   . Hypertension   . COPD (chronic obstructive pulmonary disease) ([place]) 1999  . Lung nodules 2008, 2014    LLL nodule removed 2008 with LLL lobectomy,  RLL nodule 38mm observation  . Atrial fibrillation ([place]) 11/11/2008    Qualifier: Diagnosis of  By: Lia Foyer, MD, Jaquelyn Bitter   . CARDIOMYOPATHY 10/20/2008    Qualifier: Diagnosis of  By: Owens Shark, RN, BSN, Lauren    . ISCHEMIC COLITIS, HX OF 10/20/2008    Qualifier: Diagnosis of  By: Owens Shark, RN, BSN, Lauren    . S/P mitral valve replacement 02/01/1998    [place]-Edwards porcine bioprosthetic tissue valve, size 51mm Placed for acute and subacute bacterial endocarditis (group G Streptococcus) with pre-existing mitral valve prolapse   . HYPERTENSION, BENIGN 10/20/2008    Qualifier: Diagnosis of  By: Owens Shark, RN, BSN, Lauren    . Epidural hematoma ([place]) 03/01/2011    Recent fall 2012   . Severe mitral regurgitation 02/20/2013  . Iron deficiency anemia, unspecified   . S/P mitral valve replacement with bioprosthetic valve 03/24/2013    Redo mitral valve  replacement using 30mm Edwards [employer] [place] mitral bovine bioprosthetic tissue valve performed via right mini thoracotomy  . Seizures ([place])   . Prostate cancer ([place])   . History of tobacco abuse   . Closed right acetabular fracture ([place])   . Unsteady gait   . Hyponatremia   . Insomnia       Pt orientation to unit, room and routine. Information packet given to patient/family and safety video watched.  Admission INP armband ID verified with patient/family, and in place. SR up x 2, fall risk assessment complete with Patient and family verbalizing understanding of risks associated with falls. Pt verbalizes an understanding of how to use the call bell and to call for help before getting out of bed.  Skin, clean-dry- intact without evidence of bruising, or skin tears.   No evidence of skin break down noted on exam. Abrasion to knees and nose noted, open to air.     Will cont to monitor and assist as needed.  Dayle Points, RN 12/30/2015 5:37 PM

## 2015-12-30 NOTE — Care Management Important Message (Signed)
Important Message  Patient Details  Name: Jeffery Copeland MRN: [id number] Date of Birth: 07/30/1946   Medicare Important Message Given:  Yes    Loann Quill 12/30/2015, 9:38 AM

## 2015-12-31 LAB — HEMOGLOBIN AND HEMATOCRIT, BLOOD
HCT: 29.5 % — ABNORMAL LOW (ref 39.0–52.0)
Hemoglobin: 9.7 g/dL — ABNORMAL LOW (ref 13.0–17.0)

## 2015-12-31 LAB — BASIC METABOLIC PANEL
Anion gap: 6 (ref 5–15)
BUN: <5 mg/dL — ABNORMAL LOW (ref 6–20)
CALCIUM: 8.4 mg/dL — AB (ref 8.9–10.3)
CO2: 27 mmol/L (ref 22–32)
CREATININE: 0.74 mg/dL (ref 0.61–1.24)
Chloride: 101 mmol/L (ref 101–111)
Glucose, Bld: 104 mg/dL — ABNORMAL HIGH (ref 65–99)
Potassium: 3.6 mmol/L (ref 3.5–5.1)
Sodium: 134 mmol/L — ABNORMAL LOW (ref 135–145)

## 2015-12-31 LAB — CBC
HEMATOCRIT: 25.2 % — AB (ref 39.0–52.0)
Hemoglobin: 8.3 g/dL — ABNORMAL LOW (ref 13.0–17.0)
MCH: 31 pg (ref 26.0–34.0)
MCHC: 32.9 g/dL (ref 30.0–36.0)
MCV: 94 fL (ref 78.0–100.0)
PLATELETS: 126 10*3/uL — AB (ref 150–400)
RBC: 2.68 MIL/uL — ABNORMAL LOW (ref 4.22–5.81)
RDW: 13.4 % (ref 11.5–15.5)
WBC: 6.7 10*3/uL (ref 4.0–10.5)

## 2015-12-31 LAB — PREPARE RBC (CROSSMATCH)

## 2015-12-31 LAB — PROTIME-INR
INR: 1.68 — AB (ref 0.00–1.49)
INR: 1.34 (ref 0.00–1.49)
PROTHROMBIN TIME: 19.8 s — AB (ref 11.6–15.2)
Prothrombin Time: 16.7 seconds — ABNORMAL HIGH (ref 11.6–15.2)

## 2015-12-31 MED ORDER — WARFARIN SODIUM 5 MG PO TABS
5.0000 mg | ORAL_TABLET | Freq: Once | ORAL | Status: AC
  Administered 2015-12-31: 5 mg via ORAL
  Filled 2015-12-31: qty 1

## 2015-12-31 MED ORDER — IPRATROPIUM-ALBUTEROL 0.5-2.5 (3) MG/3ML IN SOLN: 3.0000 mL | Freq: Two times a day (BID) | RESPIRATORY_TRACT | Status: DC | PRN

## 2015-12-31 MED ORDER — WARFARIN SODIUM 5 MG PO TABS: 5.0000 mg | ORAL_TABLET | Freq: Once | ORAL | Status: DC

## 2015-12-31 MED ORDER — SODIUM CHLORIDE 0.9 % IV SOLN: Freq: Once | INTRAVENOUS | Status: DC
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
## 2016-01-01 DIAGNOSIS — R569 Unspecified convulsions: Secondary | ICD-10-CM | POA: Insufficient documentation

## 2016-01-01 LAB — TYPE AND SCREEN
ABO/RH(D): A POS
Antibody Screen: NEGATIVE
UNIT DIVISION: 0

## 2016-01-01 LAB — CBC
HCT: 29.1 % — ABNORMAL LOW (ref 39.0–52.0)
HEMOGLOBIN: 9.5 g/dL — AB (ref 13.0–17.0)
MCH: 30.5 pg (ref 26.0–34.0)
MCHC: 32.6 g/dL (ref 30.0–36.0)
MCV: 93.6 fL (ref 78.0–100.0)
Platelets: 154 10*3/uL (ref 150–400)
RBC: 3.11 MIL/uL — ABNORMAL LOW (ref 4.22–5.81)
RDW: 14 % (ref 11.5–15.5)
WBC: 6.2 10*3/uL (ref 4.0–10.5)

## 2016-01-01 LAB — BASIC METABOLIC PANEL
Anion gap: 6 (ref 5–15)
CHLORIDE: 100 mmol/L — AB (ref 101–111)
CO2: 28 mmol/L (ref 22–32)
CREATININE: 0.78 mg/dL (ref 0.61–1.24)
Calcium: 8.6 mg/dL — ABNORMAL LOW (ref 8.9–10.3)
GFR calc Af Amer: >60 mL/min (ref >60)
GFR calc non Af Amer: >60 mL/min (ref >60)
Glucose, Bld: 89 mg/dL (ref 65–99)
Potassium: 3.6 mmol/L (ref 3.5–5.1)
SODIUM: 134 mmol/L — AB (ref 135–145)

## 2016-01-01 LAB — PROTIME-INR
INR: 1.42 (ref 0.00–1.49)
PROTHROMBIN TIME: 17.4 s — AB (ref 11.6–15.2)

## 2016-01-01 MED ORDER — WARFARIN SODIUM 6 MG PO TABS
6.0000 mg | ORAL_TABLET | Freq: Once | ORAL | Status: AC
  Administered 2016-01-01: 6 mg via ORAL
  Filled 2016-01-01: qty 1

## 2016-01-01 NOTE — Progress Notes (Signed)
Patient had a run of SVT, asymptomatic. Dr. Text paged will continue to monitor.
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
## 2016-01-01 NOTE — Progress Notes (Signed)
ANTICOAGULATION CONSULT NOTE - Follow Up Consult  Pharmacy Consult for Coumadin Indication: hx afib, bioprosthetic MVR  Allergies  Allergen Reactions  . Rosuvastatin Other (See Comments)    Muscle aches    Patient Measurements: Height: 5\' 9"  (175.3 cm) Weight:  (bed not zeroed, weight inaccurate 75.1 kg) IBW/kg (Calculated) : 70.7  Vital Signs: Temp: 97.8 F (36.6 C) (06/04 1444) Temp Source: Oral (06/04 1444) BP: 127/65 mmHg (06/04 1444) Pulse Rate: 74 (06/04 1444)  Labs:  Recent Labs  12/29/15 1448  12/30/15 0556  12/31/15 0456 12/31/15 2200 01/01/16 0516  HGB  --   < > 8.8*  < > 8.3* 9.7* 9.5*  HCT  --   < > 26.2*  < > 25.2* 29.5* 29.1*  PLT  --   < > 95*  --  126*  --  154  LABPROT  --   < > 37.2*  --  19.8* 16.7* 17.4*  INR  --   < > 3.89*  --  1.68* 1.34 1.42  CREATININE  --   --  0.62  --  0.74  --  0.78  TROPONINI 0.03  --   --   --   --   --   --   < > = values in this interval not displayed.  Estimated Creatinine Clearance: 63.6 mL/min (by C-G formula based on Cr of 0.78).   Medications:  Prescriptions prior to admission  Medication Sig Dispense Refill Last Dose  . Cholecalciferol (VITAMIN D3) 1000 UNITS CAPS Take 1 capsule by mouth daily.    12/26/2015 at Unknown time  . ezetimibe (ZETIA) 10 MG tablet Take 1 tablet (10 mg total) by mouth daily. 30 tablet 6 12/26/2015 at Unknown time  . HYDROcodone-acetaminophen (NORCO/VICODIN) 5-325 MG tablet Take 1 tablet by mouth every 4 (four) hours as needed for moderate pain.   0 12/27/2015 at Unknown time  . lisinopril (PRINIVIL,ZESTRIL) 20 MG tablet Take 20 mg by mouth daily.   12/26/2015 at Unknown time  . methocarbamol (ROBAXIN) 500 MG tablet Take 500 mg by mouth 2 (two) times daily.   12/26/2015 at Unknown time  . metoprolol tartrate (LOPRESSOR) 25 MG tablet TAKE 1/2 TABLET BY MOUTH TWICE A DAY 30 tablet 4 12/27/2015 at 0730  . oxyCODONE (OXY IR/ROXICODONE) 5 MG immediate release tablet 1 tablet by mouth every 4  hours as needed for moderate to severe pain 180 tablet 0 12/27/2015 at Unknown time  . phenytoin (DILANTIN) 100 MG ER capsule Take 3 capsules (300 mg total) by mouth at bedtime. 90 capsule 3 12/26/2015 at Unknown time  . polyethylene glycol (MIRALAX / GLYCOLAX) packet Take 17 g by mouth daily.    12/27/2015 at Unknown time  . sennosides-docusate sodium (SENOKOT-S) 8.6-50 MG tablet Take 2 tablets by mouth 2 (two) times daily.   Past Week at Unknown time  . SPIRIVA HANDIHALER 18 MCG inhalation capsule INHALE THE CONTENTS OF 1 CAPSULE EVERY DAY  VIA  HANDIHALER 90 capsule 0 12/27/2015 at Unknown time  . warfarin (COUMADIN) 6 MG tablet Take 1 tablet (6 mg total) by mouth daily. 15 tablet 0 12/26/2015 at Unknown time  . zolpidem (AMBIEN) 5 MG tablet Take 5 mg by mouth at bedtime as needed for sleep. For insomnia.   Past Week at Unknown time    Assessment: 70 yo M on Coumadin PTA for hx afib and bioprosthetic MVR.  INR  = 1.42 today, SUBtherapeutic but increasing toward goal. INR trend 3.59 > 5.58 after  3mg  > 5.72>3.89>1.68>1.34>1.42 Coumadin was held until 6/2, resumed at low dose 1mg  on 6/2 and gave higher dose 5mg  yesterday.  PLTC had trended down a few days ago, 175 > 120 > 88> 90s but  Has improved, increasing >126>154. No heparin exposure identified.  H/H  fell 10/30 > 8.8/26, now up to 9.5/29.1  No bleeding noted. Amoxicillin (Day#4 of 7 day course, end date 6/7)- possibly increase warfarin effect but may not be significant. -watch  Per discsussion with patient on 6/3 he stated he started eating better on 6/2. (meals eaten not charted)  Home dose = 6mg  daily (confirmed w/pt)  Goal of Therapy:  INR 2.5-3.5 per [facility] notes  FMTS note indicates Goal INR 2-3 following discussion with Cardiology.  Monitor platelets by anticoagulation protocol: Yes   Plan: Warfarin 6 mg x1 Continue daily INR  Nicole Cella, RPh Clinical Pharmacist Pager: [phone] 01/01/2016 2:45 PM

## 2016-01-01 NOTE — Clinical Social Work Note (Signed)
Clinical Social Worker met with patient with second encounter from [facility] staff in regards to SNF placement. Patient stated he was recently at [facility] and [facility] and does not wish to return to any SNF. Patient prefers to return home with home health services.   Disposition: Patient has declined SNF placement. Prefers home health care.   Clinical Social Worker will sign off for now as social work intervention is no longer needed. Please consult [place] again if new need arises.  Glendon Axe, MSW, LCSWA [phone] 01/01/2016 12:16 PM

## 2016-01-01 NOTE — Progress Notes (Signed)
Interval History:                                                                                                                      Jeffery Copeland is an 70 y.o. male patient with resolved encephalopathy, at baseline mental status now. No evidence of any further seizures since admission. Recent EEG was negative for any seizures. He is currently on Keppra 500 twice a day, tolerating well. No new neurological symptoms.  Past Medical History: Past Medical History  Diagnosis Date  . 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 Cerebrovascular disease, unspecified   . Esophageal reflux   . Generalized osteoarthrosis, unspecified site   . Hypertension   . COPD (chronic obstructive pulmonary disease) ([place]) 1999  . Lung nodules 2008, 2014    LLL nodule removed 2008 with LLL lobectomy,  RLL nodule 63mm observation  . Atrial fibrillation ([place]) 11/11/2008    Qualifier: Diagnosis of  By: Lia Foyer, MD, Jaquelyn Bitter   . CARDIOMYOPATHY 10/20/2008    Qualifier: Diagnosis of  By: Owens Shark, RN, BSN, Lauren    . ISCHEMIC COLITIS, HX OF 10/20/2008    Qualifier: Diagnosis of  By: Owens Shark, RN, BSN, Lauren    . S/P mitral valve replacement 02/01/1998    Carpentier-Edwards porcine bioprosthetic tissue valve, size 87mm Placed for acute and subacute bacterial endocarditis (group G Streptococcus) with pre-existing mitral valve prolapse   . HYPERTENSION, BENIGN 10/20/2008    Qualifier: Diagnosis of  By: Owens Shark, RN, BSN, Lauren    . Epidural hematoma ([place]) 03/01/2011    Recent fall 2012   . Severe mitral regurgitation 02/20/2013  . Iron deficiency anemia, unspecified   . S/P mitral valve replacement with bioprosthetic valve 03/24/2013    Redo mitral valve replacement using 19mm Edwards [facility] mitral bovine bioprosthetic tissue valve performed via right mini thoracotomy  . Seizures ([place])   . Prostate cancer ([place])   . History of tobacco abuse   . Closed right acetabular fracture ([place])   .  Unsteady gait   . Hyponatremia   . Insomnia     Past Surgical History  Procedure Laterality Date  . Mitral valve replacement  02/01/1998    Carpentier-Edwards porcine bioprosthetic tissue valve, size 67mm, placed for complicated bacterial endocarditis  . Video assisted thoracoscopy  04/29/2007    Left VATS w/ mini thoracotomy for Left Lower Lobectomy for benign lung nodules  . Tee without cardioversion N/A 02/12/2013    Procedure: TRANSESOPHAGEAL ECHOCARDIOGRAM (TEE);  Surgeon: Fay Records, MD;  Location: [facility] ENDOSCOPY;  Service: Cardiovascular;  Laterality: N/A;  . Mitral valve replacement Right 03/24/2013    Procedure: MINIMALLY INVASIVE REDO MITRAL VALVE (MV) REPLACEMENT;  Surgeon: Rexene Alberts, MD;  Location: [facility];  Service: Open Heart Surgery;  Laterality: Right;  . Minimally invasive maze procedure N/A 03/24/2013    Procedure: MINIMALLY INVASIVE MAZE PROCEDURE;  Surgeon: Rexene Alberts, MD;  Location: [facility];  Service: Open  Heart Surgery;  Laterality: N/A;  . Intraoperative transesophageal echocardiogram N/A 03/24/2013    Procedure: INTRAOPERATIVE TRANSESOPHAGEAL ECHOCARDIOGRAM;  Surgeon: Rexene Alberts, MD;  Location: [facility];  Service: Open Heart Surgery;  Laterality: N/A;  . Prostate biopsy      Family History: Family History  Problem Relation Age of Onset  . Cancer Father     prostate cancer  . Tuberculosis Father   . Heart attack Father   . Cancer Brother     prostate cancer  . Lung disease Neg Hx     Social History:   reports that he quit smoking about 9 years ago. His smoking use included Cigarettes. He has a 90 pack-year smoking history. He quit smokeless tobacco use about 8 years ago. He reports that he uses illicit drugs (Marijuana). He reports that he does not drink alcohol.  Allergies:  Allergies  Allergen Reactions  . Rosuvastatin Other (See Comments)    Muscle aches     Medications:                                                                                                                          Current facility-administered medications:  .  0.9 %  sodium chloride infusion, , Intravenous, Once, [employer], DO .  acetaminophen (TYLENOL) tablet 650 mg, 650 mg, Oral, Q6H PRN, 650 mg at 12/29/15 1803 **OR** acetaminophen (TYLENOL) suppository 650 mg, 650 mg, Rectal, Q6H PRN, Sela Hua, MD .  amoxicillin (AMOXIL) capsule 500 mg, 500 mg, Oral, Q8H, Sela Hua, MD, 500 mg at 01/01/16 0546 .  diclofenac sodium (VOLTAREN) 1 % transdermal gel 2 g, 2 g, Topical, QID PRN, Rogue Bussing, MD .  ezetimibe (ZETIA) tablet 10 mg, 10 mg, Oral, Daily, Sela Hua, MD, 10 mg at 01/01/16 1029 .  gi cocktail (Maalox,Lidocaine,Donnatal), 30 mL, Oral, Once, Asiyah Cletis Media, MD, Stopped at 12/27/15 2315 .  ipratropium-albuterol (DUONEB) 0.5-2.5 (3) MG/3ML nebulizer solution 3 mL, 3 mL, Nebulization, BID PRN, Walcott N Rumley, DO .  levETIRAcetam (KEPPRA) tablet 500 mg, 500 mg, Oral, BID, Ashly M Gottschalk, DO, 500 mg at 01/01/16 1030 .  lisinopril (PRINIVIL,ZESTRIL) tablet 20 mg, 20 mg, Oral, Daily, Sela Hua, MD, 20 mg at 01/01/16 1029 .  metoprolol tartrate (LOPRESSOR) tablet 12.5 mg, 12.5 mg, Oral, BID, Sela Hua, MD, 12.5 mg at 01/01/16 1030 .  oxyCODONE (Oxy IR/ROXICODONE) immediate release tablet 5 mg, 5 mg, Oral, Q4H PRN, Smiley Houseman, MD, 5 mg at 01/01/16 1035 .  polyethylene glycol (MIRALAX / GLYCOLAX) packet 17 g, 17 g, Oral, Daily PRN, Sela Hua, MD .  predniSONE (DELTASONE) tablet 40 mg, 40 mg, Oral, Q breakfast, Sela Hua, MD, 40 mg at 01/01/16 0759 .  tamsulosin (FLOMAX) capsule 0.4 mg, 0.4 mg, Oral, QPC breakfast, Ashly M Gottschalk, DO, 0.4 mg at 01/01/16 0759 .  tiotropium (SPIRIVA) inhalation capsule 18 mcg, 18 mcg, Inhalation, Daily, [employer],  MD, 18 mcg at 12/30/15 0850 .  Warfarin - Pharmacist Dosing Inpatient, , Does not apply, q1800, Para March, RPH, Stopped at 12/28/15  1800   Neurologic Examination:                                                                                                     Today's Vitals   01/01/16 1030 01/01/16 1038 01/01/16 1135 01/01/16 1444  BP: 122/70   127/65  Pulse: 80   74  Temp:    97.8 F (36.6 C)  TempSrc:    Oral  Resp:    18  Height:      Weight:      SpO2:    90%  PainSc:  8  0-No pain    His alert, seated in chair. Following simple commands, is able to elevate upper and lower extremities to command. Speech fluent.  Lab Results: Basic Metabolic Panel:  Recent Labs Lab 12/28/15 0208 12/29/15 0553 12/30/15 0556 12/30/15 1043 12/31/15 0456 01/01/16 0516  NA 124* 127* 130*  --  134* 134*  K 3.9 3.7 3.3*  --  3.6 3.6  CL 95* 98* 101  --  101 100*  CO2 22 21* 24  --  27 28  GLUCOSE 124* 104* 113*  --  104* 89  BUN <5* <5* <5*  --  <5* <5*  CREATININE 0.84 0.87 0.62  --  0.74 0.78  CALCIUM 8.1* 8.2* 8.0*  --  8.4* 8.6*  MG  --  1.7  --  1.7  --   --     Liver Function Tests:  Recent Labs Lab 12/27/15 1222 12/29/15 0553  AST 29 24  ALT 25 21  ALKPHOS 133* 88  BILITOT 1.1 0.8  PROT 6.5 5.0*  ALBUMIN 3.9 3.0*   No results for input(s): LIPASE, AMYLASE in the last 168 hours. No results for input(s): AMMONIA in the last 168 hours.  CBC:  Recent Labs Lab 12/29/15 0553 12/29/15 1501 12/30/15 0556 12/30/15 1454 12/31/15 0456 12/31/15 2200 01/01/16 0516  WBC 9.8 12.5* 6.3  --  6.7  --  6.2  HGB 9.9* 10.2* 8.8* 9.5* 8.3* 9.7* 9.5*  HCT 29.2* 30.2* 26.2* 28.2* 25.2* 29.5* 29.1*  MCV 92.7 92.1 93.6  --  94.0  --  93.6  PLT 88* 99* 95*  --  126*  --  154    Cardiac Enzymes:  Recent Labs Lab 12/27/15 2110 12/28/15 0208 12/29/15 1448  TROPONINI 0.03 <0.03 0.03    Lipid Panel: No results for input(s): CHOL, TRIG, HDL, CHOLHDL, VLDL, LDLCALC in the last 168 hours.  CBG:  Recent Labs Lab 12/27/15 [place]*    Microbiology: Results for orders placed or performed  during the hospital encounter of 12/27/15  MRSA PCR Screening     Status: None   Collection Time: 12/27/15  9:29 PM  Result Value Ref Range Status   MRSA by PCR NEGATIVE NEGATIVE Final    Comment:        The GeneXpert MRSA Assay (FDA approved for NASAL specimens  only), is one component of a comprehensive MRSA colonization surveillance program. It is not intended to diagnose MRSA infection nor to guide or monitor treatment for MRSA infections.     Imaging: Dg Chest Port 1 View  12/30/2015  CLINICAL DATA:  Shortness of breath EXAM: PORTABLE CHEST 1 VIEW COMPARISON:  10/24/2015 FINDINGS: Prior median sternotomy and valve replacement. Mild cardiomegaly. Patchy bilateral airspace opacities in the right upper lobe, patchy bilateral airspace opacities in the right upper lobe and both lung bases. No visible effusions. No pneumothorax. No acute bony abnormality. IMPRESSION: Mild cardiomegaly. Patchy bilateral airspace opacities could represent asymmetric edema or infection. Electronically Signed   By: Rolm Baptise M.D.   On: 12/30/2015 14:39    Assessment and plan:   Jeffery Copeland is an 70 y.o. male patient with resolved encephalopathy, at baseline mental status now. No evidence of any further seizures since admission. Recent EEG was negative for any seizures. He is currently on Keppra 500 twice a day, tolerating well. No new neurological symptoms. No other new  recommendations from neurology service. We'll sign off.Marland Kitchen

## 2016-01-02 ENCOUNTER — Ambulatory Visit: Payer: Commercial Managed Care - HMO | Admitting: Pulmonary Disease

## 2016-01-02 ENCOUNTER — Ambulatory Visit: Payer: Commercial Managed Care - HMO

## 2016-01-02 DIAGNOSIS — I484 Atypical atrial flutter: Secondary | ICD-10-CM

## 2016-01-02 DIAGNOSIS — Z9889 Other specified postprocedural states: Secondary | ICD-10-CM

## 2016-01-02 DIAGNOSIS — Z87891 Personal history of nicotine dependence: Secondary | ICD-10-CM

## 2016-01-02 LAB — CBC
HCT: 30 % — ABNORMAL LOW (ref 39.0–52.0)
Hemoglobin: 9.7 g/dL — ABNORMAL LOW (ref 13.0–17.0)
MCH: 30.3 pg (ref 26.0–34.0)
MCHC: 32.3 g/dL (ref 30.0–36.0)
MCV: 93.8 fL (ref 78.0–100.0)
PLATELETS: 187 10*3/uL (ref 150–400)
RBC: 3.2 MIL/uL — ABNORMAL LOW (ref 4.22–5.81)
RDW: 13.7 % (ref 11.5–15.5)
WBC: 6.4 10*3/uL (ref 4.0–10.5)

## 2016-01-02 LAB — PROTIME-INR
INR: 1.22 (ref 0.00–1.49)
Prothrombin Time: 15.6 seconds — ABNORMAL HIGH (ref 11.6–15.2)

## 2016-01-02 LAB — BASIC METABOLIC PANEL
ANION GAP: 6 (ref 5–15)
BUN: 9 mg/dL (ref 6–20)
CALCIUM: 8.5 mg/dL — AB (ref 8.9–10.3)
CO2: 29 mmol/L (ref 22–32)
CREATININE: 0.78 mg/dL (ref 0.61–1.24)
Chloride: 100 mmol/L — ABNORMAL LOW (ref 101–111)
GFR calc Af Amer: >60 mL/min (ref >60)
GLUCOSE: 93 mg/dL (ref 65–99)
Potassium: 3.7 mmol/L (ref 3.5–5.1)
Sodium: 135 mmol/L (ref 135–145)

## 2016-01-02 MED ORDER — WARFARIN SODIUM 6 MG PO TABS
6.0000 mg | ORAL_TABLET | Freq: Once | ORAL | Status: AC
  Administered 2016-01-02: 6 mg via ORAL
  Filled 2016-01-02: qty 1

## 2016-01-02 NOTE — Progress Notes (Signed)
Thank you for consult on Jeffery Copeland. Chart reviewed and note that patient is unsafe for discharge to home at this time. He does not have diagnosis to justify CIR stay and would recommend SNF for further therapies.

## 2016-01-02 NOTE — Clinical Social Work Note (Signed)
Clinical Social Work Assessment  Patient Details  Name: Jeffery Copeland MRN: [id number] Date of Birth: [birth year]  Date of referral:  01/02/16               Reason for consult:  Facility Placement                Permission sought to share information with:  Facility Sport and exercise psychologist, Family Supports Permission granted to share information::  Yes, Verbal Permission Granted  Name::     Architectural technologist::     Relationship::  Sister  Contact Information:     Housing/Transportation Living arrangements for the past 2 months:  Apartment Source of Information:  Patient Patient Interpreter Needed:  None Criminal Activity/Legal Involvement Pertinent to Current Situation/Hospitalization:  No - Comment as needed Significant Relationships:  Siblings Lives with:  Self Do you feel safe going back to the place where you live?  No Need for family participation in patient care:  No (Coment)  Care giving concerns:  CSW received referral for possible SNF placement at time of discharge. CSW met with patient at bedside regarding PT recommendation of SNF placement at time of discharge. Patient lives alone and cannot take care of himself at home given patient's current physical needs and fall risk. Patient expressed understanding of PT recommendation and is agreeable to SNF placement at time of discharge. CSW to continue to follow and assist with discharge planning needs.   Social Worker assessment / plan:  CSW spoke with patient concerning possibility of rehab at [facility] before returning home.  Employment status:  Retired Nurse, adult PT Recommendations:  [place] / Referral to community resources:  [place]  Patient/Family's Response to care:  Patient recognizes need for rehab before returning home and is agreeable to a SNF in [place]. He would like [facility] to transport him to SNF.  Patient/Family's Understanding of and  Emotional Response to Diagnosis, Current Treatment, and Prognosis:  Patient is realistic regarding therapy needs. No questions/concerns about plan or treatment.    Emotional Assessment Appearance:  Appears stated age Attitude/Demeanor/Rapport:   (Appropriate) Affect (typically observed):  Accepting, Appropriate Orientation:  Oriented to Self, Oriented to Place, Oriented to  Time, Oriented to Situation Alcohol / Substance use:  Not Applicable Psych involvement (Current and /or in the community):  No (Comment)  Discharge Needs  Concerns to be addressed:  Care Coordination Readmission within the last 30 days:  No Current discharge risk:  None Barriers to Discharge:  Continued Medical Work up   [employer], [place] 01/02/2016, 11:35 AM

## 2016-01-02 NOTE — Clinical Social Work Placement (Signed)
   CLINICAL SOCIAL WORK PLACEMENT  NOTE  Date:  01/02/2016  Patient Details  Name: Jeffery Copeland MRN: [id number] Date of Birth: [birth year]  Clinical Social Work is seeking post-discharge placement for this patient at the [place] level of care (*CSW will initial, date and re-position this form in  chart as items are completed):      Patient/family provided with [facility] Work Department's list of facilities offering this level of care within the geographic area requested by the patient (or if unable, by the patient's family).      Patient/family informed of their freedom to choose among providers that offer the needed level of care, that participate in Medicare, Medicaid or managed care program needed by the patient, have an available bed and are willing to accept the patient.      Patient/family informed of [facility]'s ownership interest in [facility] and [facility], as well as of the fact that they are under no obligation to receive care at these facilities.  PASRR submitted to EDS on       PASRR number received on       Existing PASRR number confirmed on 01/02/16     FL2 transmitted to all facilities in geographic area requested by pt/family on 01/02/16     FL2 transmitted to all facilities within larger geographic area on       Patient informed that his/her managed care company has contracts with or will negotiate with certain facilities, including the following:            Patient/family informed of bed offers received.  Patient chooses bed at       Physician recommends and patient chooses bed at      Patient to be transferred to   on  .  Patient to be transferred to facility by       Patient family notified on   of transfer.  Name of family member notified:        PHYSICIAN Please sign FL2     Additional Comment:    _______________________________________________ Benard Halsted, [place] 01/02/2016, 11:33 AM
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
## 2016-01-02 NOTE — Care Management Important Message (Signed)
Important Message  Patient Details  Name: GLEEN NORTZ MRN: [id number] Date of Birth: 23-Oct-1945   Medicare Important Message Given:  Yes    Nathen May 01/02/2016, 12:25 PM

## 2016-01-02 NOTE — NC FL2 (Signed)
[place] LEVEL OF CARE SCREENING TOOL     IDENTIFICATION  Patient Name: Jeffery Copeland Birthdate: 19-Jun-1946 Sex: male Admission Date (Current Location): 12/27/2015  [facility] and [place] Number:  Herbalist and Address:  [facility] [place]. [facility], [place] [address] [place], [place] [address]      Provider Number: [id number]  Attending Physician Name and Address:  Lupita Dawn, MD  Relative Name and Phone Number:  Altha Harm sister [phone]    Current Level of Care: Hospital Recommended Level of Care: [place] Prior Approval Number:    Date Approved/Denied:   PASRR Number: [id number]  Discharge Plan: SNF    Current Diagnoses: Patient Active Problem List   Diagnosis Date Noted  . Seizures ([place])   . Urinary retention   . Seizure ([place]) 12/27/2015  . Altered mental status   . Acetabular fracture, right, closed, initial encounter 10/24/2015  . Malignant neoplasm of prostate ([place]) 03/14/2015  . Ventral hernia without obstruction or gangrene 03/09/2014  . Umbilical hernia [id number]  . Encounter for therapeutic drug monitoring 08/20/2013  . Chronic diastolic CHF (congestive heart failure) ([place]) 07/31/2013  . 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 Pulmonary nodule, right 09/29/2012  . Dyspnea 09/26/2012  . Epidural hematoma ([place]) 03/01/2011  . Long term (current) use of anticoagulants 10/19/2010  . CAROTID ARTERY STENOSIS 05/26/2009  . ATRIAL FIBRILLATION 11/11/2008  . Atrial flutter ([place]) 11/11/2008  . ANEMIA, IRON DEFICIENCY 10/20/2008  . HYPERTENSION, BENIGN 10/20/2008  . CARDIOMYOPATHY 10/20/2008  . CEREBROVASCULAR DISEASE 10/20/2008  . COPD with emphysema ([place]) 10/20/2008  . GERD 10/20/2008  . DEGENERATIVE JOINT DISEASE, GENERALIZED 10/20/2008  . ATRIAL FIBRILLATION, CHRONIC, HX OF 10/20/2008  . ISCHEMIC COLITIS, HX OF 10/20/2008  . MITRAL VALVE REPLACEMENT,  HX OF 10/20/2008  . TOBACCO ABUSE, HX OF 10/20/2008  . S/P mitral valve replacement 02/01/1998    Orientation RESPIRATION BLADDER Height & Weight     Self, Time, Situation, Place  O2 (Nasal cannula 2L/min) Continent Weight: 73.1 kg (161 lb 2.5 oz) Height:  5\' 9"  (175.3 cm)  BEHAVIORAL SYMPTOMS/MOOD NEUROLOGICAL BOWEL NUTRITION STATUS    Convulsions/Seizures Continent Diet (Please see DC Summary)  AMBULATORY STATUS COMMUNICATION OF NEEDS Skin   Extensive Assist Verbally Normal                       Personal Care Assistance Level of Assistance  Bathing, Feeding, Dressing   Feeding assistance: Independent Dressing Assistance: Limited assistance     Functional Limitations Info             SPECIAL CARE FACTORS FREQUENCY  PT (By licensed PT)     PT Frequency: min 2x/week              Contractures      Additional Factors Info  Code Status, Allergies Code Status Info: Full Allergies Info: Rosuvastatin           Current Medications (01/02/2016):  This is the current hospital active medication list Current Facility-Administered Medications  Medication Dose Route Frequency Provider Last Rate Last Dose  . 0.9 %  sodium chloride infusion   Intravenous Once [employer], DO      . acetaminophen (TYLENOL) tablet 650 mg  650 mg Oral Q6H PRN Sela Hua, MD   650 mg at 12/29/15 1803   Or  . acetaminophen (TYLENOL) suppository 650 mg  650 mg Rectal  Q6H PRN Sela Hua, MD      . amoxicillin (AMOXIL) capsule 500 mg  500 mg Oral Q8H Sela Hua, MD   500 mg at 01/02/16 [phone]  . diclofenac sodium (VOLTAREN) 1 % transdermal gel 2 g  2 g Topical QID PRN Rogue Bussing, MD      . ezetimibe (ZETIA) tablet 10 mg  10 mg Oral Daily Sela Hua, MD   10 mg at 01/02/16 1012  . gi cocktail (Maalox,Lidocaine,Donnatal)  30 mL Oral Once Asiyah Cletis Media, MD   Stopped at 12/27/15 2315  . ipratropium-albuterol (DUONEB) 0.5-2.5 (3) MG/3ML nebulizer solution 3 mL   3 mL Nebulization BID PRN Burna Cash Rumley, DO      . levETIRAcetam (KEPPRA) tablet 500 mg  500 mg Oral BID Ashly M Gottschalk, DO   500 mg at 01/02/16 1012  . lisinopril (PRINIVIL,ZESTRIL) tablet 20 mg  20 mg Oral Daily Sela Hua, MD   20 mg at 01/02/16 1012  . metoprolol tartrate (LOPRESSOR) tablet 12.5 mg  12.5 mg Oral BID Sela Hua, MD   12.5 mg at 01/02/16 1012  . oxyCODONE (Oxy IR/ROXICODONE) immediate release tablet 5 mg  5 mg Oral Q4H PRN Smiley Houseman, MD   5 mg at 01/02/16 1108  . polyethylene glycol (MIRALAX / GLYCOLAX) packet 17 g  17 g Oral Daily PRN Sela Hua, MD      . predniSONE (DELTASONE) tablet 40 mg  40 mg Oral Q breakfast Sela Hua, MD   40 mg at 01/02/16 [phone]  . tamsulosin (FLOMAX) capsule 0.4 mg  0.4 mg Oral QPC breakfast Ashly M Gottschalk, DO   0.4 mg at 01/02/16 1012  . tiotropium (SPIRIVA) inhalation capsule 18 mcg  18 mcg Inhalation Daily Sela Hua, MD   18 mcg at 12/30/15 0850  . Warfarin - Pharmacist Dosing Inpatient   Does not apply [place], [facility]         Discharge Medications: Please see discharge summary for a list of discharge medications.  Relevant Imaging Results:  Relevant Lab Results:   Additional Information SSN: [id number]  Benard Halsted, LCSWA

## 2016-01-02 NOTE — Progress Notes (Signed)
ANTICOAGULATION CONSULT NOTE - Follow Up Consult  Pharmacy Consult for Coumadin Indication: atrial fibrillation and bioprosthetic MVR  Allergies  Allergen Reactions  . Rosuvastatin Other (See Comments)    Muscle aches    Patient Measurements: Height: 5\' 9"  (175.3 cm) Weight: 161 lb 2.5 oz (73.1 kg) IBW/kg (Calculated) : 70.7   Vital Signs: Temp: 98 F (36.7 C) (06/05 0618) Temp Source: Oral (06/05 0618) BP: 124/60 mmHg (06/05 1012) Pulse Rate: 74 (06/05 1012)  Labs:  Recent Labs  12/31/15 0456 12/31/15 2200 01/01/16 0516 01/02/16 0537  HGB 8.3* 9.7* 9.5* 9.7*  HCT 25.2* 29.5* 29.1* 30.0*  PLT 126*  --  154 187  LABPROT 19.8* 16.7* 17.4* 15.6*  INR 1.68* 1.34 1.42 1.22  CREATININE 0.74  --  0.78 0.78    Estimated Creatinine Clearance: 87.1 mL/min (by C-G formula based on Cr of 0.78).   Medications:  Scheduled:  . sodium chloride   Intravenous Once  . amoxicillin  500 mg Oral Q8H  . ezetimibe  10 mg Oral Daily  . gi cocktail  30 mL Oral Once  . levETIRAcetam  500 mg Oral BID  . lisinopril  20 mg Oral Daily  . metoprolol tartrate  12.5 mg Oral BID  . predniSONE  40 mg Oral Q breakfast  . tamsulosin  0.4 mg Oral QPC breakfast  . tiotropium  18 mcg Inhalation Daily  . Warfarin - Pharmacist Dosing Inpatient   Does not apply q1800    Assessment: 70yo male admitted with supratherapeutic INR requiring held doses x 2; dosing was resumed 6/2.  INR is down again this AM.  Hg is stable (s/p transfusion 6/3) and pltc now wnl.  No bleeding noted.  Goal per outpatient anticoagulant clinic is 2.5-3.5, but per Cards d/w FMTS this admit has been adjusted.  Possible d/c to SNF today- have d/w FMTS that while he is receiving Coumadin 6mg  to push INR, I do not expect him to need 6mg  daily and recommended an INR be repeated within 2-3 days after discharge  Goal of Therapy:  INR 2-3 Monitor platelets by anticoagulation protocol: Yes   Plan:  Repeat Coumadin 6mg  Watch for  s/s of bleeding  Gracy Bruins, PharmD Clinical Pharmacist [facility]

## 2016-01-03 DIAGNOSIS — R600 Localized edema: Secondary | ICD-10-CM | POA: Diagnosis not present

## 2016-01-03 DIAGNOSIS — R0602 Shortness of breath: Secondary | ICD-10-CM | POA: Insufficient documentation

## 2016-01-03 DIAGNOSIS — M6249 Contracture of muscle, multiple sites: Secondary | ICD-10-CM | POA: Diagnosis not present

## 2016-01-03 DIAGNOSIS — I482 Chronic atrial fibrillation: Secondary | ICD-10-CM | POA: Diagnosis not present

## 2016-01-03 DIAGNOSIS — I4891 Unspecified atrial fibrillation: Secondary | ICD-10-CM | POA: Diagnosis not present

## 2016-01-03 DIAGNOSIS — M6281 Muscle weakness (generalized): Secondary | ICD-10-CM | POA: Diagnosis not present

## 2016-01-03 DIAGNOSIS — I5032 Chronic diastolic (congestive) heart failure: Secondary | ICD-10-CM | POA: Diagnosis not present

## 2016-01-03 DIAGNOSIS — S32401A Unspecified fracture of right acetabulum, initial encounter for closed fracture: Secondary | ICD-10-CM | POA: Diagnosis not present

## 2016-01-03 DIAGNOSIS — R339 Retention of urine, unspecified: Secondary | ICD-10-CM | POA: Diagnosis not present

## 2016-01-03 DIAGNOSIS — R262 Difficulty in walking, not elsewhere classified: Secondary | ICD-10-CM | POA: Diagnosis not present

## 2016-01-03 DIAGNOSIS — R531 Weakness: Secondary | ICD-10-CM | POA: Diagnosis not present

## 2016-01-03 DIAGNOSIS — M1990 Unspecified osteoarthritis, unspecified site: Secondary | ICD-10-CM | POA: Diagnosis not present

## 2016-01-03 DIAGNOSIS — R4182 Altered mental status, unspecified: Secondary | ICD-10-CM | POA: Diagnosis not present

## 2016-01-03 DIAGNOSIS — R569 Unspecified convulsions: Secondary | ICD-10-CM | POA: Diagnosis not present

## 2016-01-03 DIAGNOSIS — Z9889 Other specified postprocedural states: Secondary | ICD-10-CM | POA: Diagnosis not present

## 2016-01-03 DIAGNOSIS — S32421D Displaced fracture of posterior wall of right acetabulum, subsequent encounter for fracture with routine healing: Secondary | ICD-10-CM | POA: Diagnosis not present

## 2016-01-03 DIAGNOSIS — G8929 Other chronic pain: Secondary | ICD-10-CM | POA: Diagnosis not present

## 2016-01-03 DIAGNOSIS — R2681 Unsteadiness on feet: Secondary | ICD-10-CM | POA: Diagnosis not present

## 2016-01-03 DIAGNOSIS — E784 Other hyperlipidemia: Secondary | ICD-10-CM | POA: Diagnosis not present

## 2016-01-03 DIAGNOSIS — Y92410 Unspecified street and highway as the place of occurrence of the external cause: Secondary | ICD-10-CM | POA: Diagnosis not present

## 2016-01-03 DIAGNOSIS — K5900 Constipation, unspecified: Secondary | ICD-10-CM | POA: Diagnosis not present

## 2016-01-03 DIAGNOSIS — G4700 Insomnia, unspecified: Secondary | ICD-10-CM | POA: Diagnosis not present

## 2016-01-03 DIAGNOSIS — J441 Chronic obstructive pulmonary disease with (acute) exacerbation: Secondary | ICD-10-CM | POA: Diagnosis not present

## 2016-01-03 DIAGNOSIS — R0989 Other specified symptoms and signs involving the circulatory and respiratory systems: Secondary | ICD-10-CM | POA: Diagnosis not present

## 2016-01-03 DIAGNOSIS — Z23 Encounter for immunization: Secondary | ICD-10-CM | POA: Diagnosis not present

## 2016-01-03 DIAGNOSIS — R5381 Other malaise: Secondary | ICD-10-CM | POA: Diagnosis not present

## 2016-01-03 DIAGNOSIS — Z5189 Encounter for other specified aftercare: Secondary | ICD-10-CM | POA: Diagnosis not present

## 2016-01-03 DIAGNOSIS — G40909 Epilepsy, unspecified, not intractable, without status epilepticus: Secondary | ICD-10-CM | POA: Diagnosis not present

## 2016-01-03 DIAGNOSIS — R269 Unspecified abnormalities of gait and mobility: Secondary | ICD-10-CM | POA: Diagnosis not present

## 2016-01-03 DIAGNOSIS — I10 Essential (primary) hypertension: Secondary | ICD-10-CM | POA: Diagnosis not present

## 2016-01-03 DIAGNOSIS — Z7901 Long term (current) use of anticoagulants: Secondary | ICD-10-CM | POA: Diagnosis not present

## 2016-01-03 DIAGNOSIS — R0781 Pleurodynia: Secondary | ICD-10-CM | POA: Diagnosis not present

## 2016-01-03 DIAGNOSIS — D638 Anemia in other chronic diseases classified elsewhere: Secondary | ICD-10-CM | POA: Diagnosis not present

## 2016-01-03 DIAGNOSIS — I484 Atypical atrial flutter: Secondary | ICD-10-CM | POA: Diagnosis not present

## 2016-01-03 DIAGNOSIS — R278 Other lack of coordination: Secondary | ICD-10-CM | POA: Diagnosis not present

## 2016-01-03 DIAGNOSIS — J438 Other emphysema: Secondary | ICD-10-CM | POA: Diagnosis not present

## 2016-01-03 LAB — BASIC METABOLIC PANEL
ANION GAP: 6 (ref 5–15)
BUN: 13 mg/dL (ref 6–20)
CALCIUM: 8.6 mg/dL — AB (ref 8.9–10.3)
CO2: 29 mmol/L (ref 22–32)
Chloride: 100 mmol/L — ABNORMAL LOW (ref 101–111)
Creatinine, Ser: 0.87 mg/dL (ref 0.61–1.24)
GFR calc non Af Amer: >60 mL/min (ref >60)
Glucose, Bld: 85 mg/dL (ref 65–99)
Potassium: 4 mmol/L (ref 3.5–5.1)
Sodium: 135 mmol/L (ref 135–145)

## 2016-01-03 LAB — PROTIME-INR
INR: 1.35 (ref 0.00–1.49)
Prothrombin Time: 16.8 seconds — ABNORMAL HIGH (ref 11.6–15.2)

## 2016-01-03 LAB — CBC
HEMATOCRIT: 32 % — AB (ref 39.0–52.0)
Hemoglobin: 10.3 g/dL — ABNORMAL LOW (ref 13.0–17.0)
MCH: 30.7 pg (ref 26.0–34.0)
MCHC: 32.2 g/dL (ref 30.0–36.0)
MCV: 95.2 fL (ref 78.0–100.0)
Platelets: 220 10*3/uL (ref 150–400)
RBC: 3.36 MIL/uL — AB (ref 4.22–5.81)
RDW: 13.7 % (ref 11.5–15.5)
WBC: 6.7 10*3/uL (ref 4.0–10.5)

## 2016-01-03 MED ORDER — OXYCODONE HCL 5 MG PO TABS: ORAL_TABLET | ORAL | Status: DC

## 2016-01-03 MED ORDER — TAMSULOSIN HCL 0.4 MG PO CAPS: 0.4000 mg | ORAL_CAPSULE | Freq: Every day | ORAL | Status: DC

## 2016-01-03 MED ORDER — WARFARIN SODIUM 5 MG PO TABS: ORAL_TABLET | ORAL | Status: DC

## 2016-01-03 MED ORDER — LEVETIRACETAM 500 MG PO TABS: 500.0000 mg | ORAL_TABLET | Freq: Two times a day (BID) | ORAL | Status: DC

## 2016-01-03 MED ORDER — OXYCODONE HCL 5 MG PO TABS
5.0000 mg | ORAL_TABLET | Freq: Four times a day (QID) | ORAL | Status: DC | PRN
  Administered 2016-01-03: 5 mg via ORAL
  Filled 2016-01-03: qty 1

## 2016-01-03 MED ORDER — ZOLPIDEM TARTRATE 5 MG PO TABS: 5.0000 mg | ORAL_TABLET | Freq: Every evening | ORAL | Status: DC | PRN

## 2016-01-03 MED ORDER — AMOXICILLIN 500 MG PO CAPS: 500.0000 mg | ORAL_CAPSULE | Freq: Three times a day (TID) | ORAL | Status: AC

## 2016-01-03 MED ORDER — WARFARIN SODIUM 6 MG PO TABS
6.0000 mg | ORAL_TABLET | Freq: Once | ORAL | Status: AC
  Administered 2016-01-03: 6 mg via ORAL
  Filled 2016-01-03: qty 1

## 2016-01-03 MED ORDER — POLYETHYLENE GLYCOL 3350 17 G PO PACK
17.0000 g | PACK | Freq: Every day | ORAL | Status: DC
  Administered 2016-01-03: 17 g via ORAL
  Filled 2016-01-03: qty 1
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
## 2016-01-03 NOTE — Progress Notes (Signed)
Patient will DC to: [facility] Anticipated DC date: 01/03/16 Family notified: N/A per patient Transport by: Corey Harold   Per MD patient ready for DC to [facility]. RN, patient, patient's family, and facility notified of DC. RN given number for report. DC packet on chart. Ambulance transport requested for patient.   CSW signing off.  Cedric Fishman, [place] Social Worker [phone]

## 2016-01-03 NOTE — Consult Note (Signed)
   [facility] Inpatient Consult   01/03/2016  Jeffery Copeland 1946-01-04 [id number]  Patient screened for potential [place] Management services. Patient is eligible for [place]. Electronic medical record reveals patient's discharge plan is [place].  Met with patient to assess for needs at this time.  Patient endorses Dr. Glendale Chard as his primary care provider.  Patient is hard of hearing.  Patient states he plans to work at rehab for a couple of weeks and hope to have [place] when he returns home if he needs further therapy after 2 weeks in the rehab.  Explained [facility] Care Management and he states he didn't think he would need anything after [place] with him.  Explained better regarding services.  He was reluctant to sign for consent at this time stating he will not know what he'll need.  Patient did accept the brochure and contact information and encouraged to call.   For questions please contact:   Natividad Brood, RN BSN [facility] Liaison  [phone] business mobile phone Toll free office [phone]

## 2016-01-03 NOTE — Progress Notes (Signed)
Pt prepared for d/c to SNF. IV d/c'd. Skin intact except as charted in most recent assessments. Vitals are stable. Report called to receiving facility. Pt to be transported by ambulance service. 

## 2016-01-03 NOTE — Clinical Social Work Placement (Signed)
   CLINICAL SOCIAL WORK PLACEMENT  NOTE  Date:  01/03/2016  Patient Details  Name: Jeffery Copeland MRN: [id number] Date of Birth: 02-Jan-1946  Clinical Social Work is seeking post-discharge placement for this patient at the [place] level of care (*CSW will initial, date and re-position this form in  chart as items are completed):  Yes   Patient/family provided with [place] Work Department's list of facilities offering this level of care within the geographic area requested by the patient (or if unable, by the patient's family).  Yes   Patient/family informed of their freedom to choose among providers that offer the needed level of care, that participate in Medicare, Medicaid or managed care program needed by the patient, have an available bed and are willing to accept the patient.  Yes   Patient/family informed of [facility] [place]'s ownership interest in [facility] and [facility], as well as of the fact that they are under no obligation to receive care at these facilities.  PASRR submitted to EDS on       PASRR number received on       Existing PASRR number confirmed on 01/02/16     FL2 transmitted to all facilities in geographic area requested by pt/family on 01/02/16     FL2 transmitted to all facilities within larger geographic area on       Patient informed that his/her managed care company has contracts with or will negotiate with certain facilities, including the following:        Yes   Patient/family informed of bed offers received.  Patient [facility] bed at [facility]     Physician recommends and patient chooses bed at      Patient to be transferred to [facility] on 01/03/16.  Patient to be transferred to facility by [facility]     Patient family notified on 01/03/16 of transfer.  Name of family member notified:  N/A per patient request     PHYSICIAN       Additional Comment:     _______________________________________________ Benard Halsted, [place] 01/03/2016, 2:09 PM
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
## 2016-01-04 ENCOUNTER — Encounter: Payer: Self-pay | Admitting: Internal Medicine

## 2016-01-04 ENCOUNTER — Non-Acute Institutional Stay (SKILLED_NURSING_FACILITY): Payer: Commercial Managed Care - HMO | Admitting: Internal Medicine

## 2016-01-04 DIAGNOSIS — I482 Chronic atrial fibrillation, unspecified: Secondary | ICD-10-CM

## 2016-01-04 DIAGNOSIS — I10 Essential (primary) hypertension: Secondary | ICD-10-CM | POA: Diagnosis not present

## 2016-01-04 DIAGNOSIS — R339 Retention of urine, unspecified: Secondary | ICD-10-CM

## 2016-01-04 DIAGNOSIS — K5900 Constipation, unspecified: Secondary | ICD-10-CM

## 2016-01-04 DIAGNOSIS — G40909 Epilepsy, unspecified, not intractable, without status epilepticus: Secondary | ICD-10-CM

## 2016-01-04 DIAGNOSIS — S32401S Unspecified fracture of right acetabulum, sequela: Secondary | ICD-10-CM

## 2016-01-04 DIAGNOSIS — Z9889 Other specified postprocedural states: Secondary | ICD-10-CM | POA: Diagnosis not present

## 2016-01-04 DIAGNOSIS — D638 Anemia in other chronic diseases classified elsewhere: Secondary | ICD-10-CM | POA: Diagnosis not present

## 2016-01-04 DIAGNOSIS — G4700 Insomnia, unspecified: Secondary | ICD-10-CM

## 2016-01-04 DIAGNOSIS — R531 Weakness: Secondary | ICD-10-CM

## 2016-01-04 DIAGNOSIS — J441 Chronic obstructive pulmonary disease with (acute) exacerbation: Secondary | ICD-10-CM
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
## 2016-01-05 DIAGNOSIS — R2681 Unsteadiness on feet: Secondary | ICD-10-CM | POA: Diagnosis not present

## 2016-01-05 DIAGNOSIS — R262 Difficulty in walking, not elsewhere classified: Secondary | ICD-10-CM | POA: Diagnosis not present

## 2016-01-05 DIAGNOSIS — G8929 Other chronic pain: Secondary | ICD-10-CM | POA: Diagnosis not present

## 2016-01-05 DIAGNOSIS — R531 Weakness: Secondary | ICD-10-CM | POA: Diagnosis not present

## 2016-01-05 DIAGNOSIS — M1990 Unspecified osteoarthritis, unspecified site: Secondary | ICD-10-CM | POA: Diagnosis not present

## 2016-01-05 DIAGNOSIS — R5381 Other malaise: Secondary | ICD-10-CM | POA: Diagnosis not present

## 2016-01-05 DIAGNOSIS — M6281 Muscle weakness (generalized): Secondary | ICD-10-CM | POA: Diagnosis not present

## 2016-01-05 DIAGNOSIS — M6249 Contracture of muscle, multiple sites: Secondary | ICD-10-CM | POA: Diagnosis not present

## 2016-01-05 DIAGNOSIS — R600 Localized edema: Secondary | ICD-10-CM | POA: Diagnosis not present

## 2016-01-09 DIAGNOSIS — R2681 Unsteadiness on feet: Secondary | ICD-10-CM | POA: Diagnosis not present

## 2016-01-09 DIAGNOSIS — M6281 Muscle weakness (generalized): Secondary | ICD-10-CM | POA: Diagnosis not present

## 2016-01-09 DIAGNOSIS — R5381 Other malaise: Secondary | ICD-10-CM | POA: Diagnosis not present

## 2016-01-09 DIAGNOSIS — R262 Difficulty in walking, not elsewhere classified: Secondary | ICD-10-CM | POA: Diagnosis not present

## 2016-01-09 DIAGNOSIS — G8929 Other chronic pain: Secondary | ICD-10-CM | POA: Diagnosis not present

## 2016-01-09 DIAGNOSIS — M6249 Contracture of muscle, multiple sites: Secondary | ICD-10-CM | POA: Diagnosis not present

## 2016-01-09 DIAGNOSIS — R600 Localized edema: Secondary | ICD-10-CM | POA: Diagnosis not present

## 2016-01-09 DIAGNOSIS — M1990 Unspecified osteoarthritis, unspecified site: Secondary | ICD-10-CM | POA: Diagnosis not present

## 2016-01-09 DIAGNOSIS — R531 Weakness: Secondary | ICD-10-CM | POA: Diagnosis not present

## 2016-01-09 LAB — BASIC METABOLIC PANEL
BUN: 9 mg/dL (ref 4–21)
CREATININE: 0.9 mg/dL (ref 0.6–1.3)
Glucose: 87 mg/dL
Potassium: 4.3 mmol/L (ref 3.4–5.3)
Sodium: 140 mmol/L (ref 137–147)

## 2016-01-09 LAB — HEPATIC FUNCTION PANEL
ALK PHOS: 207 U/L — AB (ref 25–125)
ALT: 12 U/L (ref 10–40)
AST: 8 U/L — AB (ref 14–40)
BILIRUBIN, TOTAL: 0.7 mg/dL

## 2016-01-09 LAB — CBC AND DIFFERENTIAL
HCT: 35 % — AB (ref 41–53)
Hemoglobin: 10.9 g/dL — AB (ref 13.5–17.5)
NEUTROS ABS: 4 /uL
PLATELETS: 270 10*3/uL (ref 150–399)
WBC: 5.2 10*3/mL

## 2016-01-10 DIAGNOSIS — R2681 Unsteadiness on feet: Secondary | ICD-10-CM | POA: Diagnosis not present

## 2016-01-10 DIAGNOSIS — R5381 Other malaise: Secondary | ICD-10-CM | POA: Diagnosis not present

## 2016-01-10 DIAGNOSIS — R600 Localized edema: Secondary | ICD-10-CM | POA: Diagnosis not present

## 2016-01-10 DIAGNOSIS — R262 Difficulty in walking, not elsewhere classified: Secondary | ICD-10-CM | POA: Diagnosis not present

## 2016-01-10 DIAGNOSIS — M6281 Muscle weakness (generalized): Secondary | ICD-10-CM | POA: Diagnosis not present

## 2016-01-10 DIAGNOSIS — R531 Weakness: Secondary | ICD-10-CM | POA: Diagnosis not present

## 2016-01-10 DIAGNOSIS — M1990 Unspecified osteoarthritis, unspecified site: Secondary | ICD-10-CM | POA: Diagnosis not present

## 2016-01-10 DIAGNOSIS — G8929 Other chronic pain: Secondary | ICD-10-CM | POA: Diagnosis not present

## 2016-01-10 DIAGNOSIS — M6249 Contracture of muscle, multiple sites: Secondary | ICD-10-CM | POA: Diagnosis not present

## 2016-01-11 DIAGNOSIS — R5381 Other malaise: Secondary | ICD-10-CM | POA: Diagnosis not present

## 2016-01-11 DIAGNOSIS — R531 Weakness: Secondary | ICD-10-CM | POA: Diagnosis not present

## 2016-01-11 DIAGNOSIS — R600 Localized edema: Secondary | ICD-10-CM | POA: Diagnosis not present

## 2016-01-11 DIAGNOSIS — M6249 Contracture of muscle, multiple sites: Secondary | ICD-10-CM | POA: Diagnosis not present

## 2016-01-11 DIAGNOSIS — M6281 Muscle weakness (generalized): Secondary | ICD-10-CM | POA: Diagnosis not present

## 2016-01-11 DIAGNOSIS — M1990 Unspecified osteoarthritis, unspecified site: Secondary | ICD-10-CM | POA: Diagnosis not present

## 2016-01-11 DIAGNOSIS — R2681 Unsteadiness on feet: Secondary | ICD-10-CM | POA: Diagnosis not present

## 2016-01-11 DIAGNOSIS — R262 Difficulty in walking, not elsewhere classified: Secondary | ICD-10-CM | POA: Diagnosis not present

## 2016-01-11 DIAGNOSIS — G8929 Other chronic pain: Secondary | ICD-10-CM | POA: Diagnosis not present

## 2016-01-12 DIAGNOSIS — M1990 Unspecified osteoarthritis, unspecified site: Secondary | ICD-10-CM | POA: Diagnosis not present

## 2016-01-12 DIAGNOSIS — R5381 Other malaise: Secondary | ICD-10-CM | POA: Diagnosis not present

## 2016-01-12 DIAGNOSIS — M6249 Contracture of muscle, multiple sites: Secondary | ICD-10-CM | POA: Diagnosis not present

## 2016-01-12 DIAGNOSIS — M6281 Muscle weakness (generalized): Secondary | ICD-10-CM | POA: Diagnosis not present

## 2016-01-12 DIAGNOSIS — R600 Localized edema: Secondary | ICD-10-CM | POA: Diagnosis not present

## 2016-01-12 DIAGNOSIS — G8929 Other chronic pain: Secondary | ICD-10-CM | POA: Diagnosis not present

## 2016-01-12 DIAGNOSIS — R531 Weakness: Secondary | ICD-10-CM | POA: Diagnosis not present

## 2016-01-12 DIAGNOSIS — R2681 Unsteadiness on feet: Secondary | ICD-10-CM | POA: Diagnosis not present

## 2016-01-12 DIAGNOSIS — R262 Difficulty in walking, not elsewhere classified: Secondary | ICD-10-CM | POA: Diagnosis not present

## 2016-01-13 DIAGNOSIS — M6249 Contracture of muscle, multiple sites: Secondary | ICD-10-CM | POA: Diagnosis not present

## 2016-01-13 DIAGNOSIS — R600 Localized edema: Secondary | ICD-10-CM | POA: Diagnosis not present

## 2016-01-13 DIAGNOSIS — R2681 Unsteadiness on feet: Secondary | ICD-10-CM | POA: Diagnosis not present

## 2016-01-13 DIAGNOSIS — R5381 Other malaise: Secondary | ICD-10-CM | POA: Diagnosis not present

## 2016-01-13 DIAGNOSIS — R262 Difficulty in walking, not elsewhere classified: Secondary | ICD-10-CM | POA: Diagnosis not present

## 2016-01-13 DIAGNOSIS — G8929 Other chronic pain: Secondary | ICD-10-CM | POA: Diagnosis not present

## 2016-01-13 DIAGNOSIS — M6281 Muscle weakness (generalized): Secondary | ICD-10-CM | POA: Diagnosis not present

## 2016-01-13 DIAGNOSIS — R531 Weakness: Secondary | ICD-10-CM | POA: Diagnosis not present

## 2016-01-13 DIAGNOSIS — M1990 Unspecified osteoarthritis, unspecified site: Secondary | ICD-10-CM | POA: Diagnosis not present

## 2016-01-16 DIAGNOSIS — M6281 Muscle weakness (generalized): Secondary | ICD-10-CM | POA: Diagnosis not present

## 2016-01-16 DIAGNOSIS — R2681 Unsteadiness on feet: Secondary | ICD-10-CM | POA: Diagnosis not present

## 2016-01-16 DIAGNOSIS — M1990 Unspecified osteoarthritis, unspecified site: Secondary | ICD-10-CM | POA: Diagnosis not present

## 2016-01-16 DIAGNOSIS — R262 Difficulty in walking, not elsewhere classified: Secondary | ICD-10-CM | POA: Diagnosis not present

## 2016-01-16 DIAGNOSIS — G8929 Other chronic pain: Secondary | ICD-10-CM | POA: Diagnosis not present

## 2016-01-16 DIAGNOSIS — M6249 Contracture of muscle, multiple sites: Secondary | ICD-10-CM | POA: Diagnosis not present

## 2016-01-16 DIAGNOSIS — R5381 Other malaise: Secondary | ICD-10-CM | POA: Diagnosis not present

## 2016-01-16 DIAGNOSIS — R531 Weakness: Secondary | ICD-10-CM | POA: Diagnosis not present

## 2016-01-16 DIAGNOSIS — R600 Localized edema: Secondary | ICD-10-CM | POA: Diagnosis not present

## 2016-01-17 ENCOUNTER — Non-Acute Institutional Stay (SKILLED_NURSING_FACILITY): Payer: Commercial Managed Care - HMO | Admitting: Adult Health

## 2016-01-17 ENCOUNTER — Encounter: Payer: Self-pay | Admitting: Adult Health

## 2016-01-17 DIAGNOSIS — R600 Localized edema: Secondary | ICD-10-CM | POA: Diagnosis not present

## 2016-01-17 DIAGNOSIS — M1990 Unspecified osteoarthritis, unspecified site: Secondary | ICD-10-CM | POA: Diagnosis not present

## 2016-01-17 DIAGNOSIS — I482 Chronic atrial fibrillation, unspecified: Secondary | ICD-10-CM

## 2016-01-17 DIAGNOSIS — M6249 Contracture of muscle, multiple sites: Secondary | ICD-10-CM | POA: Diagnosis not present

## 2016-01-17 DIAGNOSIS — R2681 Unsteadiness on feet: Secondary | ICD-10-CM | POA: Diagnosis not present

## 2016-01-17 DIAGNOSIS — R5381 Other malaise: Secondary | ICD-10-CM | POA: Diagnosis not present

## 2016-01-17 DIAGNOSIS — R262 Difficulty in walking, not elsewhere classified: Secondary | ICD-10-CM | POA: Diagnosis not present

## 2016-01-17 DIAGNOSIS — Z7901 Long term (current) use of anticoagulants: Secondary | ICD-10-CM

## 2016-01-17 DIAGNOSIS — R531 Weakness: Secondary | ICD-10-CM | POA: Diagnosis not present

## 2016-01-17 DIAGNOSIS — M6281 Muscle weakness (generalized): Secondary | ICD-10-CM | POA: Diagnosis not present

## 2016-01-17 DIAGNOSIS — G8929 Other chronic pain: Secondary | ICD-10-CM | POA: Diagnosis not present

## 2016-01-17 NOTE — Progress Notes (Signed)
Patient ID: Jeffery Copeland, male   DOB: November 15, 1945, 70 y.o.   MRN: [id number] Subjective:     Indication: atrial fibrillation Bleeding signs/symptoms: None Thromboembolic signs/symptoms: None  Missed Coumadin doses: None Medication changes: no Dietary changes: no Bacterial/viral infection: no Other concerns: no  The following portions of the patient's history were reviewed and updated as appropriate: allergies, current medications, past family history, past medical history, past social history, past surgical history and problem list.  Review of Systems A comprehensive review of systems was negative.   Objective:    INR Today: 3.1 Current dose: Coumadin 6 mg daily     Assessment:    Supratherapeutic INR for goal of 2-3   Plan:    1. New dose: Discontinue Coumadin 6 mg and start Coumadin 5.5 mg daily   2. Next INR: 01/20/16

## 2016-01-18 ENCOUNTER — Telehealth: Payer: Self-pay | Admitting: Neurology

## 2016-01-18 DIAGNOSIS — M6281 Muscle weakness (generalized): Secondary | ICD-10-CM | POA: Diagnosis not present

## 2016-01-18 DIAGNOSIS — R262 Difficulty in walking, not elsewhere classified: Secondary | ICD-10-CM | POA: Diagnosis not present

## 2016-01-18 DIAGNOSIS — G8929 Other chronic pain: Secondary | ICD-10-CM | POA: Diagnosis not present

## 2016-01-18 DIAGNOSIS — R600 Localized edema: Secondary | ICD-10-CM | POA: Diagnosis not present

## 2016-01-18 DIAGNOSIS — M1990 Unspecified osteoarthritis, unspecified site: Secondary | ICD-10-CM | POA: Diagnosis not present

## 2016-01-18 DIAGNOSIS — R2681 Unsteadiness on feet: Secondary | ICD-10-CM | POA: Diagnosis not present

## 2016-01-18 DIAGNOSIS — M6249 Contracture of muscle, multiple sites: Secondary | ICD-10-CM | POA: Diagnosis not present

## 2016-01-18 DIAGNOSIS — R5381 Other malaise: Secondary | ICD-10-CM | POA: Diagnosis not present

## 2016-01-18 DIAGNOSIS — R531 Weakness: Secondary | ICD-10-CM | POA: Diagnosis not present
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
## 2016-01-20 ENCOUNTER — Encounter: Payer: Self-pay | Admitting: Adult Health

## 2016-01-20 ENCOUNTER — Non-Acute Institutional Stay (SKILLED_NURSING_FACILITY): Payer: Commercial Managed Care - HMO | Admitting: Adult Health

## 2016-01-20 DIAGNOSIS — Z7901 Long term (current) use of anticoagulants: Secondary | ICD-10-CM | POA: Diagnosis not present

## 2016-01-20 DIAGNOSIS — J438 Other emphysema: Secondary | ICD-10-CM

## 2016-01-20 DIAGNOSIS — D638 Anemia in other chronic diseases classified elsewhere: Secondary | ICD-10-CM

## 2016-01-20 DIAGNOSIS — G40909 Epilepsy, unspecified, not intractable, without status epilepticus: Secondary | ICD-10-CM

## 2016-01-20 DIAGNOSIS — S32401S Unspecified fracture of right acetabulum, sequela: Secondary | ICD-10-CM | POA: Diagnosis not present

## 2016-01-20 DIAGNOSIS — G4700 Insomnia, unspecified: Secondary | ICD-10-CM

## 2016-01-20 DIAGNOSIS — I482 Chronic atrial fibrillation, unspecified: Secondary | ICD-10-CM

## 2016-01-20 DIAGNOSIS — K5900 Constipation, unspecified: Secondary | ICD-10-CM | POA: Diagnosis not present

## 2016-01-20 DIAGNOSIS — Z9889 Other specified postprocedural states: Secondary | ICD-10-CM

## 2016-01-20 DIAGNOSIS — E785 Hyperlipidemia, unspecified: Secondary | ICD-10-CM

## 2016-01-20 DIAGNOSIS — R339 Retention of urine, unspecified: Secondary | ICD-10-CM

## 2016-01-20 DIAGNOSIS — S32421D Displaced fracture of posterior wall of right acetabulum, subsequent encounter for fracture with routine healing: Secondary | ICD-10-CM | POA: Diagnosis not present

## 2016-01-20 DIAGNOSIS — R531 Weakness: Secondary | ICD-10-CM | POA: Diagnosis not present

## 2016-01-20 DIAGNOSIS — I10 Essential (primary) hypertension: Secondary | ICD-10-CM

## 2016-01-20 NOTE — Progress Notes (Signed)
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 continue oxycodone 5 mg 1 tab by mouth every 6 hours when necessary and change Robaxin 500 mg 1 tab by mouth every 8 hours when necessary   Atrial fibrillation - rate controlled; continue metoprolol tartrate 25 mg 1/2 tab = 12.5 mg by mouth twice a day  Constipation - continue senna S 2 tabs by mouth twice a day and MiraLAX 17 g by mouth twice a day  Anemia of chronic disease - stable Lab Results  Component Value Date   WBC 5.2 01/09/2016   HGB 10.9* 01/09/2016   HCT 35* 01/09/2016   MCV 95.2 01/03/2016  PLT 270 01/09/2016   Long-term use of anticoagulant - INR 3.5, supratherapeutic; Hold Coumadin and re-check INR 01/21/16  History of mitral valve replacement - hold Coumadin today due to supratherapeutic INR; re-check INR 01/21/16      I have filled out patient's discharge paperwork and written prescriptions.  Patient will receive home health PT, OT and CNA.  DME provided:  None  Total discharge time: Greater than 30 minutes  Discharge time involved coordination of the discharge process with social worker, nursing staff and therapy department.  Medical justification for home health services verified.    Durenda Age, NP [employer] [phone]

## 2016-01-23 ENCOUNTER — Other Ambulatory Visit: Payer: Self-pay | Admitting: Adult Health

## 2016-01-23 ENCOUNTER — Telehealth: Payer: Self-pay | Admitting: Cardiology

## 2016-01-23 NOTE — Telephone Encounter (Signed)
New Message  Pt call requesting to speak with RN. Pt is confused if needs to make a coumadin appt. Please call back to discuss
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
## 2016-01-24 DIAGNOSIS — R079 Chest pain, unspecified: Secondary | ICD-10-CM | POA: Diagnosis not present

## 2016-01-27 ENCOUNTER — Ambulatory Visit (INDEPENDENT_AMBULATORY_CARE_PROVIDER_SITE_OTHER): Payer: Commercial Managed Care - HMO | Admitting: Pharmacist

## 2016-01-27 DIAGNOSIS — I4891 Unspecified atrial fibrillation: Secondary | ICD-10-CM

## 2016-01-27 DIAGNOSIS — Z7901 Long term (current) use of anticoagulants: Secondary | ICD-10-CM

## 2016-01-27 DIAGNOSIS — I482 Chronic atrial fibrillation, unspecified: Secondary | ICD-10-CM

## 2016-01-27 DIAGNOSIS — Z5181 Encounter for therapeutic drug level monitoring: Secondary | ICD-10-CM | POA: Diagnosis not present

## 2016-01-27 LAB — POCT INR: INR: 1.9

## 2016-02-01 ENCOUNTER — Telehealth: Payer: Self-pay | Admitting: Cardiovascular Disease

## 2016-02-01 ENCOUNTER — Ambulatory Visit (INDEPENDENT_AMBULATORY_CARE_PROVIDER_SITE_OTHER): Payer: Commercial Managed Care - HMO | Admitting: Adult Health

## 2016-02-01 ENCOUNTER — Encounter: Payer: Self-pay | Admitting: Adult Health

## 2016-02-01 VITALS — BP 138/80 | HR 82 | Resp 24 | Ht 69.0 in | Wt 150.0 lb

## 2016-02-01 DIAGNOSIS — Z5181 Encounter for therapeutic drug level monitoring: Secondary | ICD-10-CM | POA: Diagnosis not present

## 2016-02-01 DIAGNOSIS — R079 Chest pain, unspecified: Secondary | ICD-10-CM | POA: Diagnosis not present

## 2016-02-01 DIAGNOSIS — R569 Unspecified convulsions: Secondary | ICD-10-CM

## 2016-02-01 DIAGNOSIS — Z8673 Personal history of transient ischemic attack (TIA), and cerebral infarction without residual deficits: Secondary | ICD-10-CM | POA: Diagnosis not present

## 2016-02-01 DIAGNOSIS — M2550 Pain in unspecified joint: Secondary | ICD-10-CM | POA: Diagnosis not present

## 2016-02-01 DIAGNOSIS — Z79899 Other long term (current) drug therapy: Secondary | ICD-10-CM | POA: Diagnosis not present

## 2016-02-01 DIAGNOSIS — G4700 Insomnia, unspecified: Secondary | ICD-10-CM | POA: Diagnosis not present

## 2016-02-01 MED ORDER — LEVETIRACETAM 500 MG PO TABS: 500.0000 mg | ORAL_TABLET | Freq: Two times a day (BID) | ORAL | Status: DC

## 2016-02-01 MED ORDER — WARFARIN SODIUM 6 MG PO TABS: ORAL_TABLET | ORAL | Status: DC

## 2016-02-01 NOTE — Patient Instructions (Signed)
Continue Dilantin Start Keppra 500 mg twice a day Blood work today If your symptoms worsen or you develop new symptoms please let us know.
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
## 2016-02-01 NOTE — Telephone Encounter (Signed)
NEW MESSAGE   *STAT* If patient is at the pharmacy, call can be transferred to refill team.   1. Which medications need to be refilled? (please list name of each medication and dose if known) COUMADIN  2. Which pharmacy/location (including street and city if local pharmacy) is medication to be sent to? [facility] [place] [address] [place] [place]  3. Do they need a 30 day or 90 day supply?

## 2016-02-01 NOTE — Telephone Encounter (Signed)
Spoke with pt and instructed that his refill of warfarin 6mg  was sent to [facility] as requested

## 2016-02-02 ENCOUNTER — Telehealth: Payer: Self-pay

## 2016-02-02 LAB — PHENYTOIN LEVEL, TOTAL: Phenytoin (Dilantin), Serum: 2.5 ug/mL — ABNORMAL LOW (ref 10.0–20.0)

## 2016-02-02 NOTE — Telephone Encounter (Signed)
I spoke to pt and advised him that his dilantin level is low. Pt states that he is currently taking 3 capsules of the dilantin 100mg  daily and he is also taking keppra twice daily. (He started the keppra yesterday.) I advised him that we will recheck his blood work at his next office visit. Pt verbalized understanding.

## 2016-02-02 NOTE — Telephone Encounter (Signed)
-----   Message from Ward Givens, NP sent at 02/02/2016 11:49 AM EDT ----- Dilantin level is low- He should be taking 300 mg daily (3 tablets). I started him on Keppra- please ensure that he has started this. We will recheck blood work at the next office visit. Please call patient.

## 2016-02-03 ENCOUNTER — Encounter (HOSPITAL_COMMUNITY): Payer: Self-pay | Admitting: Emergency Medicine

## 2016-02-03 ENCOUNTER — Telehealth: Payer: Self-pay | Admitting: Cardiology

## 2016-02-03 ENCOUNTER — Emergency Department (HOSPITAL_COMMUNITY): Payer: Commercial Managed Care - HMO

## 2016-02-03 ENCOUNTER — Emergency Department (HOSPITAL_COMMUNITY)
Admission: EM | Admit: 2016-02-03 | Discharge: 2016-02-03 | Disposition: A | Discharge status: Home / Self Care | Payer: Commercial Managed Care - HMO | Attending: Emergency Medicine | Admitting: Emergency Medicine

## 2016-02-03 DIAGNOSIS — Z8679 Personal history of other diseases of the circulatory system: Secondary | ICD-10-CM | POA: Insufficient documentation

## 2016-02-03 DIAGNOSIS — I4891 Unspecified atrial fibrillation: Secondary | ICD-10-CM | POA: Insufficient documentation

## 2016-02-03 DIAGNOSIS — J449 Chronic obstructive pulmonary disease, unspecified: Secondary | ICD-10-CM | POA: Insufficient documentation

## 2016-02-03 DIAGNOSIS — R221 Localized swelling, mass and lump, neck: Secondary | ICD-10-CM | POA: Diagnosis not present

## 2016-02-03 DIAGNOSIS — T783XXA Angioneurotic edema, initial encounter: Secondary | ICD-10-CM | POA: Insufficient documentation

## 2016-02-03 DIAGNOSIS — Z8673 Personal history of transient ischemic attack (TIA), and cerebral infarction without residual deficits: Secondary | ICD-10-CM | POA: Diagnosis not present

## 2016-02-03 DIAGNOSIS — Z952 Presence of prosthetic heart valve: Secondary | ICD-10-CM | POA: Diagnosis not present

## 2016-02-03 DIAGNOSIS — Z7901 Long term (current) use of anticoagulants: Secondary | ICD-10-CM | POA: Insufficient documentation

## 2016-02-03 DIAGNOSIS — Z8546 Personal history of malignant neoplasm of prostate: Secondary | ICD-10-CM | POA: Insufficient documentation

## 2016-02-03 DIAGNOSIS — I10 Essential (primary) hypertension: Secondary | ICD-10-CM | POA: Diagnosis not present

## 2016-02-03 DIAGNOSIS — Z8669 Personal history of other diseases of the nervous system and sense organs: Secondary | ICD-10-CM | POA: Insufficient documentation

## 2016-02-03 DIAGNOSIS — K0889 Other specified disorders of teeth and supporting structures: Secondary | ICD-10-CM | POA: Diagnosis present

## 2016-02-03 DIAGNOSIS — Z87891 Personal history of nicotine dependence: Secondary | ICD-10-CM | POA: Insufficient documentation

## 2016-02-03 DIAGNOSIS — Z79899 Other long term (current) drug therapy: Secondary | ICD-10-CM | POA: Insufficient documentation

## 2016-02-03 LAB — CBC WITH DIFFERENTIAL/PLATELET
Basophils Absolute: 0 10*3/uL (ref 0.0–0.1)
Basophils Relative: 0 %
Eosinophils Absolute: 0 10*3/uL (ref 0.0–0.7)
Eosinophils Relative: 0 %
HCT: 33.3 % — ABNORMAL LOW (ref 39.0–52.0)
Hemoglobin: 11.4 g/dL — ABNORMAL LOW (ref 13.0–17.0)
Lymphocytes Relative: 11 %
Lymphs Abs: 1.3 10*3/uL (ref 0.7–4.0)
MCH: 31.2 pg (ref 26.0–34.0)
MCHC: 34.2 g/dL (ref 30.0–36.0)
MCV: 91.2 fL (ref 78.0–100.0)
Monocytes Absolute: 1 10*3/uL (ref 0.1–1.0)
Monocytes Relative: 9 %
Neutro Abs: 9.1 10*3/uL — ABNORMAL HIGH (ref 1.7–7.7)
Neutrophils Relative %: 80 %
Platelets: 218 10*3/uL (ref 150–400)
RBC: 3.65 MIL/uL — ABNORMAL LOW (ref 4.22–5.81)
RDW: 13.1 % (ref 11.5–15.5)
WBC: 11.4 10*3/uL — ABNORMAL HIGH (ref 4.0–10.5)

## 2016-02-03 LAB — PROTIME-INR
INR: 5.92 (ref 0.00–1.49)
Prothrombin Time: 51.1 seconds — ABNORMAL HIGH (ref 11.6–15.2)

## 2016-02-03 LAB — BASIC METABOLIC PANEL
Anion gap: 8 (ref 5–15)
BUN: 8 mg/dL (ref 6–20)
CO2: 25 mmol/L (ref 22–32)
Calcium: 8.5 mg/dL — ABNORMAL LOW (ref 8.9–10.3)
Chloride: 96 mmol/L — ABNORMAL LOW (ref 101–111)
Creatinine, Ser: 0.92 mg/dL (ref 0.61–1.24)
GFR calc Af Amer: >60 mL/min (ref >60)
GFR calc non Af Amer: >60 mL/min (ref >60)
Glucose, Bld: 93 mg/dL (ref 65–99)
Potassium: 3.5 mmol/L (ref 3.5–5.1)
Sodium: 129 mmol/L — ABNORMAL LOW (ref 135–145)

## 2016-02-03 MED ORDER — IOPAMIDOL (ISOVUE-300) INJECTION 61%
75.0000 mL | Freq: Once | INTRAVENOUS | Status: AC | PRN
  Administered 2016-02-03: 75 mL via INTRAVENOUS

## 2016-02-03 NOTE — Telephone Encounter (Signed)
Patient should go to the emergency room for evaluation thanks. Please call sister and let her know.

## 2016-02-03 NOTE — Telephone Encounter (Signed)
Dr Ahern- please advise 

## 2016-02-03 NOTE — Discharge Instructions (Signed)
You need to stop your Lisinopril and Altace and call your doctor to advise a change in your medications as it can cause this swelling. You also need to skip your Coumadin doses for the next 3 days and have your INR rechecked Monday.

## 2016-02-03 NOTE — Telephone Encounter (Signed)
Pt's sister called in after speaking with pt on the phone who was complaining of mouth and tongue swelling. She says he did not seem to be having a hard time breathing. I suggested that pt be seen at an Urgent Care or go to ER. She expressed understanding and said she would take him to ER. I told her our office would also call. Carloyn Jaeger- [phone]

## 2016-02-03 NOTE — Telephone Encounter (Signed)
I have spoken with Jeffery Copeland and per AA, advised she has brother eval in the ER.  She verbalized understanding of same, sts. they just arrived in the ER/fim

## 2016-02-03 NOTE — ED Notes (Signed)
PA at bedside.

## 2016-02-03 NOTE — ED Notes (Addendum)
Pt c/o bleeding and swollen gums that started yesterday. Pt states he takes coumadin and was also started on a new medication this week for seizures but does not remember the name.

## 2016-02-03 NOTE — ED Provider Notes (Signed)
CSN: [id number]     Arrival date & time 02/03/16  1056 History   First MD Initiated Contact with Patient 02/03/16 1117     Chief Complaint  Patient presents with  . Dental Pain     (Consider location/radiation/quality/duration/timing/severity/associated sxs/prior Treatment) HPI The patient presents to the ER with gum pain and swelling under his tongue for the past day. The patient states that the swelling has improved somewhat. The patient states that he did not take any new medications. The patient states that he did not take any medications for the swelling. The patient advised that he did not see his PCP.  Past Medical History  Diagnosis Date  . 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 Cerebrovascular disease, unspecified   . Esophageal reflux   . Generalized osteoarthrosis, unspecified site   . Hypertension   . COPD (chronic obstructive pulmonary disease) ([place]) 1999  . Lung nodules 2008, 2014    LLL nodule removed 2008 with LLL lobectomy,  RLL nodule 29mm observation  . Atrial fibrillation ([place]) 11/11/2008    Qualifier: Diagnosis of  By: Lia Foyer, MD, Jaquelyn Bitter   . CARDIOMYOPATHY 10/20/2008    Qualifier: Diagnosis of  By: Owens Shark, RN, BSN, Lauren    . ISCHEMIC COLITIS, HX OF 10/20/2008    Qualifier: Diagnosis of  By: Owens Shark, RN, BSN, Lauren    . S/P mitral valve replacement 02/01/1998    Carpentier-Edwards porcine bioprosthetic tissue valve, size 41mm Placed for acute and subacute bacterial endocarditis (group G Streptococcus) with pre-existing mitral valve prolapse   . HYPERTENSION, BENIGN 10/20/2008    Qualifier: Diagnosis of  By: Owens Shark, RN, BSN, Lauren    . Epidural hematoma ([place]) 03/01/2011    Recent fall 2012   . Severe mitral regurgitation 02/20/2013  . Iron deficiency anemia, unspecified   . S/P mitral valve replacement with bioprosthetic valve 03/24/2013    Redo mitral valve replacement using 52mm [facility] mitral bovine bioprosthetic  tissue valve performed via right mini thoracotomy  . Seizures ([place])   . Prostate cancer ([place])   . History of tobacco abuse   . Closed right acetabular fracture ([place])   . Unsteady gait   . Hyponatremia   . Insomnia    Past Surgical History  Procedure Laterality Date  . Mitral valve replacement  02/01/1998    Carpentier-Edwards porcine bioprosthetic tissue valve, size 68mm, placed for complicated bacterial endocarditis  . Video assisted thoracoscopy  04/29/2007    Left VATS w/ mini thoracotomy for Left Lower Lobectomy for benign lung nodules  . Tee without cardioversion N/A 02/12/2013    Procedure: TRANSESOPHAGEAL ECHOCARDIOGRAM (TEE);  Surgeon: Fay Records, MD;  Location: [facility] ENDOSCOPY;  Service: Cardiovascular;  Laterality: N/A;  . Mitral valve replacement Right 03/24/2013    Procedure: MINIMALLY INVASIVE REDO MITRAL VALVE (MV) REPLACEMENT;  Surgeon: Rexene Alberts, MD;  Location: [place];  Service: Open Heart Surgery;  Laterality: Right;  . Minimally invasive maze procedure N/A 03/24/2013    Procedure: MINIMALLY INVASIVE MAZE PROCEDURE;  Surgeon: Rexene Alberts, MD;  Location: [place];  Service: Open Heart Surgery;  Laterality: N/A;  . Intraoperative transesophageal echocardiogram N/A 03/24/2013    Procedure: INTRAOPERATIVE TRANSESOPHAGEAL ECHOCARDIOGRAM;  Surgeon: Rexene Alberts, MD;  Location: [place];  Service: Open Heart Surgery;  Laterality: N/A;  . Prostate biopsy     Family History  Problem Relation Age of [place]  . Cancer Father     prostate  cancer  . Tuberculosis Father   . Heart attack Father   . Cancer Brother     prostate cancer  . Lung disease Neg Hx    Social History  Substance Use Topics  . Smoking status: Former Smoker -- 2.00 packs/day for 45 years    Types: Cigarettes    Quit date: 07/30/2006  . Smokeless tobacco: Former Systems developer    Quit date: 07/31/2007  . Alcohol Use: No     Comment: previous alcohol use & home-made alcohol consumption    Review of Systems    Constitutional: Negative for fever, chills and diaphoresis.  HENT: Positive for dental problem. Negative for congestion, drooling, facial swelling, mouth sores and nosebleeds.   Respiratory: Negative for cough, choking, chest tightness, shortness of breath and stridor.   Cardiovascular: Negative for chest pain.  Gastrointestinal: Negative for nausea, vomiting and abdominal pain.  Endocrine: Negative for polydipsia and polyphagia.  Genitourinary: Negative for dysuria and difficulty urinating.  Musculoskeletal: Negative for back pain, joint swelling, neck pain and neck stiffness.  Skin: Negative for rash.  Allergic/Immunologic: Negative for environmental allergies and food allergies.  Neurological: Negative for dizziness, speech difficulty, weakness and headaches.      Allergies  Rosuvastatin  Home Medications   Prior to Admission medications   Medication Sig Start Date End Date Taking? Authorizing Provider  Cholecalciferol (VITAMIN D3) 1000 UNITS CAPS Take 1 capsule by mouth daily.    Yes Historical Provider, MD  ezetimibe (ZETIA) 10 MG tablet Take 1 tablet (10 mg total) by mouth daily. 10/30/12  Yes Hillary Bow, MD  levETIRAcetam (KEPPRA) 500 MG tablet Take 1 tablet (500 mg total) by mouth 2 (two) times daily. 02/01/16  Yes Ward Givens, NP  lisinopril (PRINIVIL,ZESTRIL) 20 MG tablet Take 20 mg by mouth daily.   Yes Historical Provider, MD  Menthol, Topical Analgesic, (BIOFREEZE) 4 % GEL Apply 1 application topically every 4 (four) hours as needed. Apply to chest wall   Yes Historical Provider, MD  methocarbamol (ROBAXIN) 500 MG tablet Take 500 mg by mouth every 8 (eight) hours as needed for muscle spasms.    Yes Historical Provider, MD  metoprolol tartrate (LOPRESSOR) 25 MG tablet TAKE 1/2 TABLET BY MOUTH TWICE A DAY Patient taking differently: TAKE 112.5mg   BY MOUTH TWICE A DAY 03/25/14  Yes Larey Dresser, MD  oxyCODONE (OXY IR/ROXICODONE) 5 MG immediate release tablet 1 tablet  by mouth every 6 hours as needed for moderate to severe pain 01/03/16  Yes Smiley Houseman, MD  phenytoin (DILANTIN) 100 MG ER capsule Take 300 mg by mouth daily.   Yes Historical Provider, MD  ramipril (ALTACE) 5 MG capsule Take 5 mg by mouth daily.   Yes Historical Provider, MD  sennosides-docusate sodium (SENOKOT-S) 8.6-50 MG tablet Take 2 tablets by mouth 2 (two) times daily.   Yes Historical Provider, MD  SPIRIVA HANDIHALER 18 MCG inhalation capsule INHALE THE CONTENTS OF 1 CAPSULE EVERY DAY  VIA  HANDIHALER 08/25/15  Yes Javier Glazier, MD  temazepam (RESTORIL) 15 MG capsule Take 15 mg by mouth at bedtime as needed. sleep 02/01/16  Yes Historical Provider, MD  warfarin (COUMADIN) 6 MG tablet Take as directed by coumadin clinic Patient taking differently: Take 6 mg by mouth daily. Take as directed by coumadin clinic 02/01/16  Yes Larey Dresser, MD  tamsulosin [facility]) 0.4 MG CAPS capsule Take 1 capsule (0.4 mg total) by mouth daily after breakfast. Patient not taking: Reported on 02/03/2016 01/03/16  Smiley Houseman, MD  zolpidem (AMBIEN) 5 MG tablet Take 1 tablet (5 mg total) by mouth at bedtime as needed for sleep. For insomnia. Patient not taking: Reported on 02/03/2016 01/03/16   Smiley Houseman, MD   BP 115/72 mmHg  Pulse 82  Temp(Src) 98.4 F (36.9 C) (Oral)  Resp 16  SpO2 98% Physical Exam  Constitutional: He is oriented to person, place, and time. He appears well-developed and well-nourished. No distress.  HENT:  Head: Normocephalic and atraumatic.  Mouth/Throat: Uvula is midline and oropharynx is clear and moist. No oral lesions. No trismus in the jaw. No dental abscesses or uvula swelling. No posterior oropharyngeal edema or posterior oropharyngeal erythema.    Eyes: Pupils are equal, round, and reactive to light.  Neck: Normal range of motion. Neck supple.  Cardiovascular: Normal rate, regular rhythm and normal heart sounds.  Exam reveals no gallop and no friction rub.    No murmur heard. Pulmonary/Chest: Effort normal and breath sounds normal. No respiratory distress. He has no wheezes.  Neurological: He is alert and oriented to person, place, and time. He exhibits normal muscle tone. Coordination normal.  Skin: Skin is warm and dry. No rash noted. No erythema.  Psychiatric: He has a normal mood and affect. His behavior is normal.  Nursing note and vitals reviewed.   ED Course  Procedures (including critical care time) Labs Review Labs Reviewed  BASIC METABOLIC PANEL - Abnormal; Notable for the following:    Sodium 129 (*)    Chloride 96 (*)    Calcium 8.5 (*)    All other components within normal limits  CBC WITH DIFFERENTIAL/PLATELET - Abnormal; Notable for the following:    WBC 11.4 (*)    RBC 3.65 (*)    Hemoglobin 11.4 (*)    HCT 33.3 (*)    Neutro Abs 9.1 (*)    All other components within normal limits  PROTIME-INR - Abnormal; Notable for the following:    Prothrombin Time 51.1 (*)    INR 5.92 (*)    All other components within normal limits    Imaging Review Ct Soft Tissue Neck W Contrast  02/03/2016  CLINICAL DATA:  70 year old male with throat pain and swelling since yesterday. Swelling under the tongue with pain. Bleeding and swollen gums. Recently started a new medication for seizures. Initial encounter. EXAM: CT NECK WITH CONTRAST TECHNIQUE: Multidetector CT imaging of the neck was performed using the standard protocol following the bolus administration of intravenous contrast. CONTRAST:  37mL ISOVUE-300 IOPAMIDOL (ISOVUE-300) INJECTION 61% COMPARISON:  Cervical spine CT 12/27/2015. FINDINGS: Pharynx and larynx: Normal larynx. The epiglottis is normal. Likewise, pharyngeal soft tissue contours appear normal. Negative parapharyngeal and retropharyngeal spaces. There appears to be perioral soft tissue swelling, primarily affecting the upper lip. The degree of oral tongue swelling is difficult to discern. There does appear to be mild  generalized swelling at the floor of mouth. There is no focal inflammation evident in the sublingual space. The tongue base appears normal. Salivary glands: Submandibular glands and parotid glands appear normal. Thyroid: Negative. Lymph nodes: Negative.  No cervical lymphadenopathy. Vascular: Major vascular structures in the neck and at the skullbase are patent. There is bilateral carotid atherosclerosis, and there is hemodynamically significant stenosis suspected in the proximal right ICA bulb as seen on series 3, image 54 and sagittal image 28. Limited intracranial: Negative. Visualized orbits: Negative. Mastoids and visualized paranasal sinuses: Visualized paranasal sinuses and mastoids are stable and well pneumatized. Skeleton: Absent  dentition. Stable visualized cervical and upper thoracic spine remarkable for numerous upper thoracic compression fractures, exaggerated upper thoracic kyphosis and cervical spine degeneration. No acute or suspicious osseous lesion identified. Upper chest: No superior mediastinal lymphadenopathy. Pulmonary emphysema. IMPRESSION: 1. Soft-tissue swelling appears limited to the oral tongue and lips/perioral soft tissues. Favor drug related angioedema in this setting. 2. The base of the tongue, pharynx, and larynx are spared. And no other acute soft tissue finding identified in the neck. 3. Calcified atherosclerosis of the aorta and carotid arteries with hemodynamically significant proximal right ICA stenosis suspected. 4. Emphysema. 5. Osteopenia with numerous chronic upper thoracic compression fractures. Electronically Signed   By: Genevie Ann M.D.   On: 02/03/2016 14:09   I have personally reviewed and evaluated these images and lab results as part of my medical decision-making.   Patient is treated for angioedema. The patient is advised to stop his ACE inhibitors and lisinopril. Pt is advised to follow up with PCP on new BP meds. Told to return here as needed.  Dalia Heading, PA-C 02/06/16 [place], DO 02/06/16 1457

## 2016-02-06 ENCOUNTER — Ambulatory Visit (INDEPENDENT_AMBULATORY_CARE_PROVIDER_SITE_OTHER): Payer: Commercial Managed Care - HMO | Admitting: Pharmacist

## 2016-02-06 DIAGNOSIS — I482 Chronic atrial fibrillation, unspecified: Secondary | ICD-10-CM

## 2016-02-06 DIAGNOSIS — Z5181 Encounter for therapeutic drug level monitoring: Secondary | ICD-10-CM

## 2016-02-06 LAB — POCT INR: INR: 2.8

## 2016-02-07 DIAGNOSIS — I110 Hypertensive heart disease with heart failure: Secondary | ICD-10-CM | POA: Diagnosis not present

## 2016-02-07 DIAGNOSIS — G40909 Epilepsy, unspecified, not intractable, without status epilepticus: Secondary | ICD-10-CM | POA: Diagnosis not present

## 2016-02-07 DIAGNOSIS — K219 Gastro-esophageal reflux disease without esophagitis: Secondary | ICD-10-CM | POA: Diagnosis not present

## 2016-02-07 DIAGNOSIS — D509 Iron deficiency anemia, unspecified: Secondary | ICD-10-CM | POA: Diagnosis not present

## 2016-02-07 DIAGNOSIS — J449 Chronic obstructive pulmonary disease, unspecified: Secondary | ICD-10-CM | POA: Diagnosis not present

## 2016-02-07 DIAGNOSIS — I5032 Chronic diastolic (congestive) heart failure: Secondary | ICD-10-CM | POA: Diagnosis not present

## 2016-02-07 DIAGNOSIS — I429 Cardiomyopathy, unspecified: Secondary | ICD-10-CM | POA: Diagnosis not present

## 2016-02-07 DIAGNOSIS — M160 Bilateral primary osteoarthritis of hip: Secondary | ICD-10-CM | POA: Diagnosis not present

## 2016-02-07 DIAGNOSIS — I482 Chronic atrial fibrillation: Secondary | ICD-10-CM | POA: Diagnosis not present

## 2016-02-07 NOTE — Progress Notes (Signed)
I agree with the above plan 

## 2016-02-08 ENCOUNTER — Other Ambulatory Visit: Payer: Self-pay | Admitting: Neurology

## 2016-02-08 ENCOUNTER — Other Ambulatory Visit: Payer: Self-pay | Admitting: Cardiology

## 2016-02-08 MED ORDER — WARFARIN SODIUM 6 MG PO TABS: ORAL_TABLET | ORAL | Status: DC

## 2016-02-13 ENCOUNTER — Other Ambulatory Visit: Payer: Self-pay | Admitting: Adult Health

## 2016-02-13 DIAGNOSIS — I482 Chronic atrial fibrillation: Secondary | ICD-10-CM | POA: Diagnosis not present

## 2016-02-13 DIAGNOSIS — I5032 Chronic diastolic (congestive) heart failure: Secondary | ICD-10-CM | POA: Diagnosis not present

## 2016-02-13 DIAGNOSIS — D509 Iron deficiency anemia, unspecified: Secondary | ICD-10-CM | POA: Diagnosis not present

## 2016-02-13 DIAGNOSIS — J449 Chronic obstructive pulmonary disease, unspecified: Secondary | ICD-10-CM | POA: Diagnosis not present

## 2016-02-13 DIAGNOSIS — K219 Gastro-esophageal reflux disease without esophagitis: Secondary | ICD-10-CM | POA: Diagnosis not present

## 2016-02-13 DIAGNOSIS — I110 Hypertensive heart disease with heart failure: Secondary | ICD-10-CM | POA: Diagnosis not present

## 2016-02-13 DIAGNOSIS — M160 Bilateral primary osteoarthritis of hip: Secondary | ICD-10-CM | POA: Diagnosis not present

## 2016-02-13 DIAGNOSIS — I429 Cardiomyopathy, unspecified: Secondary | ICD-10-CM | POA: Diagnosis not present

## 2016-02-13 DIAGNOSIS — G40909 Epilepsy, unspecified, not intractable, without status epilepticus: Secondary | ICD-10-CM | POA: Diagnosis not present

## 2016-02-16 DIAGNOSIS — D509 Iron deficiency anemia, unspecified: Secondary | ICD-10-CM | POA: Diagnosis not present

## 2016-02-16 DIAGNOSIS — I482 Chronic atrial fibrillation: Secondary | ICD-10-CM | POA: Diagnosis not present

## 2016-02-16 DIAGNOSIS — M160 Bilateral primary osteoarthritis of hip: Secondary | ICD-10-CM | POA: Diagnosis not present

## 2016-02-16 DIAGNOSIS — I429 Cardiomyopathy, unspecified: Secondary | ICD-10-CM | POA: Diagnosis not present

## 2016-02-16 DIAGNOSIS — J449 Chronic obstructive pulmonary disease, unspecified: Secondary | ICD-10-CM | POA: Diagnosis not present

## 2016-02-16 DIAGNOSIS — G40909 Epilepsy, unspecified, not intractable, without status epilepticus: Secondary | ICD-10-CM | POA: Diagnosis not present

## 2016-02-16 DIAGNOSIS — I110 Hypertensive heart disease with heart failure: Secondary | ICD-10-CM | POA: Diagnosis not present

## 2016-02-16 DIAGNOSIS — I5032 Chronic diastolic (congestive) heart failure: Secondary | ICD-10-CM | POA: Diagnosis not present

## 2016-02-16 DIAGNOSIS — K219 Gastro-esophageal reflux disease without esophagitis: Secondary | ICD-10-CM | POA: Diagnosis not present

## 2016-02-17 ENCOUNTER — Other Ambulatory Visit: Payer: Self-pay

## 2016-02-17 NOTE — Patient Outreach (Signed)
[facility]) Care Management  02/17/2016  [name] 08-04-1945 [id number]  REFERRAL SOURCE: [facility] REFERRAL REASON: Disease and symptom management. [place] diagnosis, unspecified convulsions.  Telephone call to patient regarding regarding [facility] referral.  Unable to reach patient. HIPAA compliant voice message left with call back phone number.    PLAN;  RNCM will attempt 2nd telephone outreach to patient within 1 week.  Quinn Plowman RN,BSN,CCM [facility] Telephonic  [phone]

## 2016-02-20 ENCOUNTER — Encounter (INDEPENDENT_AMBULATORY_CARE_PROVIDER_SITE_OTHER): Payer: Self-pay

## 2016-02-20 ENCOUNTER — Ambulatory Visit (INDEPENDENT_AMBULATORY_CARE_PROVIDER_SITE_OTHER): Payer: Commercial Managed Care - HMO | Admitting: *Deleted

## 2016-02-20 ENCOUNTER — Other Ambulatory Visit: Payer: Self-pay

## 2016-02-20 DIAGNOSIS — Z7901 Long term (current) use of anticoagulants: Secondary | ICD-10-CM | POA: Diagnosis not present

## 2016-02-20 DIAGNOSIS — I4891 Unspecified atrial fibrillation: Secondary | ICD-10-CM | POA: Diagnosis not present

## 2016-02-20 DIAGNOSIS — Z5181 Encounter for therapeutic drug level monitoring: Secondary | ICD-10-CM

## 2016-02-20 LAB — POCT INR: INR: 5.8

## 2016-02-21 DIAGNOSIS — K219 Gastro-esophageal reflux disease without esophagitis: Secondary | ICD-10-CM | POA: Diagnosis not present

## 2016-02-21 DIAGNOSIS — D509 Iron deficiency anemia, unspecified: Secondary | ICD-10-CM | POA: Diagnosis not present

## 2016-02-21 DIAGNOSIS — I5032 Chronic diastolic (congestive) heart failure: Secondary | ICD-10-CM | POA: Diagnosis not present

## 2016-02-21 DIAGNOSIS — J449 Chronic obstructive pulmonary disease, unspecified: Secondary | ICD-10-CM | POA: Diagnosis not present

## 2016-02-21 DIAGNOSIS — S32421D Displaced fracture of posterior wall of right acetabulum, subsequent encounter for fracture with routine healing: Secondary | ICD-10-CM | POA: Diagnosis not present

## 2016-02-21 DIAGNOSIS — M160 Bilateral primary osteoarthritis of hip: Secondary | ICD-10-CM | POA: Diagnosis not present

## 2016-02-21 DIAGNOSIS — G40909 Epilepsy, unspecified, not intractable, without status epilepticus: Secondary | ICD-10-CM | POA: Diagnosis not present

## 2016-02-21 DIAGNOSIS — I110 Hypertensive heart disease with heart failure: Secondary | ICD-10-CM | POA: Diagnosis not present

## 2016-02-21 DIAGNOSIS — I429 Cardiomyopathy, unspecified: Secondary | ICD-10-CM | POA: Diagnosis not present

## 2016-02-21 DIAGNOSIS — I482 Chronic atrial fibrillation: Secondary | ICD-10-CM | POA: Diagnosis not present

## 2016-02-22 ENCOUNTER — Other Ambulatory Visit: Payer: Self-pay

## 2016-02-22 NOTE — Patient Outreach (Signed)
[facility]) Care Management  02/22/2016  Jeffery Copeland 12/04/1945 [id number]  Late entry for 02/20/16: Second telephone attempt to contact patient regarding [facility] referral. Unable to reach patient. HIPAA compliant voice message left with call back phone number.   PLAN;  RNCM will attempt 3rd telephone outreach within 1 week.  Quinn Plowman RN,BSN,CCM [facility] [address] Telephonic  [phone]

## 2016-02-22 NOTE — Patient Outreach (Addendum)
[place] [facility]) Care Management  02/22/2016  Jeffery Copeland [birth year] [id number]   REFERRAL SOURCE: [facility] REFERRAL REASON: Disease and symptom management. [place] diagnosis, unspecified convulsions.  Third telephone call to patient regarding [facility] referral. Unable to reach patient. HIPAA compliant voice message left with call back phone number.  PLAN; RNCM will send patient outreach letter to attempt engagement RNCM will contact referral source/ [facility] and request assistance with engagement.   Called made to Jettie Booze, clinical social worker.  Voice message left requesting return call and requesting assistance with engaging patient to [facility] care management services.   Quinn Plowman RN,BSN,CCM [facility] Telephonic  [phone]

## 2016-02-23 DIAGNOSIS — K219 Gastro-esophageal reflux disease without esophagitis: Secondary | ICD-10-CM | POA: Diagnosis not present

## 2016-02-23 DIAGNOSIS — I110 Hypertensive heart disease with heart failure: Secondary | ICD-10-CM | POA: Diagnosis not present

## 2016-02-23 DIAGNOSIS — I5032 Chronic diastolic (congestive) heart failure: Secondary | ICD-10-CM | POA: Diagnosis not present

## 2016-02-23 DIAGNOSIS — J449 Chronic obstructive pulmonary disease, unspecified: Secondary | ICD-10-CM | POA: Diagnosis not present

## 2016-02-23 DIAGNOSIS — D509 Iron deficiency anemia, unspecified: Secondary | ICD-10-CM | POA: Diagnosis not present

## 2016-02-23 DIAGNOSIS — I482 Chronic atrial fibrillation: Secondary | ICD-10-CM | POA: Diagnosis not present

## 2016-02-23 DIAGNOSIS — M160 Bilateral primary osteoarthritis of hip: Secondary | ICD-10-CM | POA: Diagnosis not present

## 2016-02-23 DIAGNOSIS — I429 Cardiomyopathy, unspecified: Secondary | ICD-10-CM | POA: Diagnosis not present

## 2016-02-23 DIAGNOSIS — G40909 Epilepsy, unspecified, not intractable, without status epilepticus: Secondary | ICD-10-CM | POA: Diagnosis not present

## 2016-02-24 ENCOUNTER — Other Ambulatory Visit: Payer: Self-pay

## 2016-02-24 ENCOUNTER — Ambulatory Visit: Payer: Self-pay

## 2016-02-24 NOTE — Patient Outreach (Signed)
[place] [facility] [facility]) Care Management  02/24/2016  Jeffery Copeland 11-Jul-1946 [id number]   Voice mail message left from Jettie Booze, clinical social worker from [place] on 02/23/16.  Ms. Jeffery Copeland  stated she spoke with patient regarding  Engaging [facility] care management services. States patient will contact [place].   PLAN: If no return call from patient RNCM will attempt 3rd telephone outreach call within 1 week.   Quinn Plowman RN,BSN,CCM [facility] Telephonic  [phone]

## 2016-02-27 ENCOUNTER — Ambulatory Visit (INDEPENDENT_AMBULATORY_CARE_PROVIDER_SITE_OTHER): Payer: Commercial Managed Care - HMO | Admitting: Pharmacist

## 2016-02-27 DIAGNOSIS — Z5181 Encounter for therapeutic drug level monitoring: Secondary | ICD-10-CM

## 2016-02-27 DIAGNOSIS — I4891 Unspecified atrial fibrillation: Secondary | ICD-10-CM | POA: Diagnosis not present

## 2016-02-27 LAB — POCT INR: INR: 3.4

## 2016-02-28 DIAGNOSIS — M160 Bilateral primary osteoarthritis of hip: Secondary | ICD-10-CM | POA: Diagnosis not present

## 2016-02-28 DIAGNOSIS — I482 Chronic atrial fibrillation: Secondary | ICD-10-CM | POA: Diagnosis not present

## 2016-02-28 DIAGNOSIS — I4891 Unspecified atrial fibrillation: Secondary | ICD-10-CM | POA: Diagnosis not present

## 2016-02-28 DIAGNOSIS — M6281 Muscle weakness (generalized): Secondary | ICD-10-CM | POA: Diagnosis not present

## 2016-02-28 DIAGNOSIS — I110 Hypertensive heart disease with heart failure: Secondary | ICD-10-CM | POA: Diagnosis not present

## 2016-02-28 DIAGNOSIS — I429 Cardiomyopathy, unspecified: Secondary | ICD-10-CM | POA: Diagnosis not present

## 2016-02-28 DIAGNOSIS — S32401A Unspecified fracture of right acetabulum, initial encounter for closed fracture: Secondary | ICD-10-CM | POA: Diagnosis not present

## 2016-02-28 DIAGNOSIS — G40909 Epilepsy, unspecified, not intractable, without status epilepticus: Secondary | ICD-10-CM | POA: Diagnosis not present

## 2016-02-28 DIAGNOSIS — J449 Chronic obstructive pulmonary disease, unspecified: Secondary | ICD-10-CM | POA: Diagnosis not present

## 2016-02-28 DIAGNOSIS — D509 Iron deficiency anemia, unspecified: Secondary | ICD-10-CM | POA: Diagnosis not present

## 2016-02-28 DIAGNOSIS — I5032 Chronic diastolic (congestive) heart failure: Secondary | ICD-10-CM | POA: Diagnosis not present

## 2016-02-28 DIAGNOSIS — K219 Gastro-esophageal reflux disease without esophagitis: Secondary | ICD-10-CM | POA: Diagnosis not present

## 2016-02-28 DIAGNOSIS — R262 Difficulty in walking, not elsewhere classified: Secondary | ICD-10-CM | POA: Diagnosis not present

## 2016-03-01 ENCOUNTER — Other Ambulatory Visit: Payer: Self-pay

## 2016-03-01 DIAGNOSIS — I429 Cardiomyopathy, unspecified: Secondary | ICD-10-CM | POA: Diagnosis not present

## 2016-03-01 DIAGNOSIS — I482 Chronic atrial fibrillation: Secondary | ICD-10-CM | POA: Diagnosis not present

## 2016-03-01 DIAGNOSIS — J449 Chronic obstructive pulmonary disease, unspecified: Secondary | ICD-10-CM | POA: Diagnosis not present

## 2016-03-01 DIAGNOSIS — I110 Hypertensive heart disease with heart failure: Secondary | ICD-10-CM | POA: Diagnosis not present

## 2016-03-01 DIAGNOSIS — I5032 Chronic diastolic (congestive) heart failure: Secondary | ICD-10-CM | POA: Diagnosis not present

## 2016-03-01 DIAGNOSIS — M160 Bilateral primary osteoarthritis of hip: Secondary | ICD-10-CM | POA: Diagnosis not present

## 2016-03-01 DIAGNOSIS — G40909 Epilepsy, unspecified, not intractable, without status epilepticus: Secondary | ICD-10-CM | POA: Diagnosis not present

## 2016-03-01 DIAGNOSIS — K219 Gastro-esophageal reflux disease without esophagitis: Secondary | ICD-10-CM | POA: Diagnosis not present

## 2016-03-01 DIAGNOSIS — D509 Iron deficiency anemia, unspecified: Secondary | ICD-10-CM | POA: Diagnosis not present

## 2016-03-01 NOTE — Patient Outreach (Signed)
[place] [facility]) Care Management  03/01/2016  Jeffery Copeland 1945/12/12 [id number]  Attempted Telephone call to patient regarding [facility] referral.  Unable to reach patient or leave message due to phone ringing busy.    PLAN; Outreach letter was sent to patient on 02/24/16. If no response in 10 business day will close case.   Quinn Plowman RN,BSN,CCM [facility] Telephonic  [phone]

## 2016-03-02 ENCOUNTER — Other Ambulatory Visit: Payer: Self-pay | Admitting: Pharmacist

## 2016-03-02 ENCOUNTER — Other Ambulatory Visit: Payer: Self-pay

## 2016-03-02 DIAGNOSIS — R569 Unspecified convulsions: Secondary | ICD-10-CM

## 2016-03-02 DIAGNOSIS — J441 Chronic obstructive pulmonary disease with (acute) exacerbation: Secondary | ICD-10-CM
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
## 2016-03-05 ENCOUNTER — Other Ambulatory Visit: Payer: Self-pay | Admitting: *Deleted

## 2016-03-05 NOTE — Patient Outreach (Signed)
[facility]) Care Management  03/05/2016  Jeffery Copeland [birth year] [id number]   RN spoke with pt and introduced the [facility] program and available services. Pt states [facility] is involved with weekly visits however feels he continues to need assistance. RN inquired further as pt states he needs a new provider and would like assistance. RN offered to mail him a list of local providers in the area for him to choose from as pt very receptive. RN inquired on other possible needs as pt states he is aware of his medications and understands the purpose of all his medications, reports using [facility] for transportation to all his medication appointments with no missed appointments and states he is aware of HTN and how to manage this medication condition. RN offered further education on some of his medication issues and pt was receptive. Will inquire further on his needs upon the initial home visit that was scheduled for next week. Will inquire further on possible assistance. Will also request offer to sent out mailing list of available primary providers in the area and review with pt if he has not chosen a provider by the home visit date.  Stress the importance of prevention and living alone safe in his home. Plan of care generated and discussed with pt for adherence with medical appointments, medication and safety in the home due to his seizure activity.  Raina Mina, RN Care Management Coordinator [place] Office [phone]

## 2016-03-06 DIAGNOSIS — I482 Chronic atrial fibrillation: Secondary | ICD-10-CM | POA: Diagnosis not present

## 2016-03-06 DIAGNOSIS — I5032 Chronic diastolic (congestive) heart failure: Secondary | ICD-10-CM | POA: Diagnosis not present

## 2016-03-06 DIAGNOSIS — G40909 Epilepsy, unspecified, not intractable, without status epilepticus: Secondary | ICD-10-CM | POA: Diagnosis not present

## 2016-03-06 DIAGNOSIS — K219 Gastro-esophageal reflux disease without esophagitis: Secondary | ICD-10-CM | POA: Diagnosis not present

## 2016-03-06 DIAGNOSIS — M160 Bilateral primary osteoarthritis of hip: Secondary | ICD-10-CM | POA: Diagnosis not present

## 2016-03-06 DIAGNOSIS — J449 Chronic obstructive pulmonary disease, unspecified: Secondary | ICD-10-CM | POA: Diagnosis not present

## 2016-03-06 DIAGNOSIS — D509 Iron deficiency anemia, unspecified: Secondary | ICD-10-CM | POA: Diagnosis not present

## 2016-03-06 DIAGNOSIS — I110 Hypertensive heart disease with heart failure: Secondary | ICD-10-CM | POA: Diagnosis not present

## 2016-03-06 DIAGNOSIS — I429 Cardiomyopathy, unspecified: Secondary | ICD-10-CM | POA: Diagnosis not present

## 2016-03-09 DIAGNOSIS — I110 Hypertensive heart disease with heart failure: Secondary | ICD-10-CM | POA: Diagnosis not present

## 2016-03-09 DIAGNOSIS — J449 Chronic obstructive pulmonary disease, unspecified: Secondary | ICD-10-CM | POA: Diagnosis not present

## 2016-03-09 DIAGNOSIS — I482 Chronic atrial fibrillation: Secondary | ICD-10-CM | POA: Diagnosis not present

## 2016-03-09 DIAGNOSIS — G40909 Epilepsy, unspecified, not intractable, without status epilepticus: Secondary | ICD-10-CM | POA: Diagnosis not present

## 2016-03-09 DIAGNOSIS — D509 Iron deficiency anemia, unspecified: Secondary | ICD-10-CM | POA: Diagnosis not present

## 2016-03-09 DIAGNOSIS — I429 Cardiomyopathy, unspecified: Secondary | ICD-10-CM | POA: Diagnosis not present

## 2016-03-09 DIAGNOSIS — K219 Gastro-esophageal reflux disease without esophagitis: Secondary | ICD-10-CM | POA: Diagnosis not present

## 2016-03-09 DIAGNOSIS — I5032 Chronic diastolic (congestive) heart failure: Secondary | ICD-10-CM | POA: Diagnosis not present

## 2016-03-09 DIAGNOSIS — M160 Bilateral primary osteoarthritis of hip: Secondary | ICD-10-CM | POA: Diagnosis not present

## 2016-03-12 ENCOUNTER — Ambulatory Visit (INDEPENDENT_AMBULATORY_CARE_PROVIDER_SITE_OTHER): Payer: Commercial Managed Care - HMO | Admitting: Pharmacist

## 2016-03-12 DIAGNOSIS — I4891 Unspecified atrial fibrillation: Secondary | ICD-10-CM

## 2016-03-12 DIAGNOSIS — Z5181 Encounter for therapeutic drug level monitoring: Secondary | ICD-10-CM

## 2016-03-12 LAB — POCT INR: INR: 3.8

## 2016-03-13 DIAGNOSIS — I482 Chronic atrial fibrillation: Secondary | ICD-10-CM | POA: Diagnosis not present

## 2016-03-13 DIAGNOSIS — G40909 Epilepsy, unspecified, not intractable, without status epilepticus: Secondary | ICD-10-CM | POA: Diagnosis not present

## 2016-03-13 DIAGNOSIS — D509 Iron deficiency anemia, unspecified: Secondary | ICD-10-CM | POA: Diagnosis not present

## 2016-03-13 DIAGNOSIS — I429 Cardiomyopathy, unspecified: Secondary | ICD-10-CM | POA: Diagnosis not present

## 2016-03-13 DIAGNOSIS — J449 Chronic obstructive pulmonary disease, unspecified: Secondary | ICD-10-CM | POA: Diagnosis not present

## 2016-03-13 DIAGNOSIS — K219 Gastro-esophageal reflux disease without esophagitis: Secondary | ICD-10-CM | POA: Diagnosis not present

## 2016-03-13 DIAGNOSIS — M160 Bilateral primary osteoarthritis of hip: Secondary | ICD-10-CM | POA: Diagnosis not present

## 2016-03-13 DIAGNOSIS — I110 Hypertensive heart disease with heart failure: Secondary | ICD-10-CM | POA: Diagnosis not present

## 2016-03-13 DIAGNOSIS — I5032 Chronic diastolic (congestive) heart failure: Secondary | ICD-10-CM | POA: Diagnosis not present

## 2016-03-15 ENCOUNTER — Encounter: Payer: Self-pay | Admitting: *Deleted

## 2016-03-15 ENCOUNTER — Other Ambulatory Visit: Payer: Self-pay | Admitting: *Deleted

## 2016-03-15 NOTE — Patient Outreach (Signed)
[facility]) Care Management   03/15/2016  Jeffery Copeland 1946/06/17 [id number]  Jeffery Copeland is an 70 y.o. male  Subjective:  SAFETY: Pt states no falls since his discharge with no issues reported. Pt uses his rolling walker daily both inside and outside the home with no incidents reported. Pt's sister in the home and indicated pt home remains safe with no throw rugs throughout as RN requested to completed a safety evaluation in the home.  Pt reports one cord going across the living room floor however refuse to move indicating this has been in place for two years with no incidents.  COPD:Pt not aware of his COPD but reports he takes his inhaler as prescribed with no reported problems.  MEDICAL APPOINTMENTS: Pt reports he attends all his medical appointments however request assistance to change his primary care provider. Pt states he is willing to go to any provider in network within the [facility] area.   MEDICATIONS: Pt states he takes all his medications and has a sufficient supply of refills with no assistance needed at this time. Pt reports he uses [employer] for transportation for all his medical appointments.   Objective:   Review of Systems  Constitutional: Negative.   HENT: Negative.   Eyes: Negative.   Respiratory: Negative.   Cardiovascular: Negative.   Gastrointestinal: Negative.   Genitourinary: Negative.   Musculoskeletal: Positive for neck pain.  Skin: Negative.   Neurological: Negative.   Endo/Heme/Allergies: Negative.   Psychiatric/Behavioral: Negative.     Physical Exam  Constitutional: He is oriented to person, place, and time. He appears well-developed and well-nourished.  HENT:  Right Ear: External ear normal.  Left Ear: External ear normal.  Eyes: EOM are normal.  Neck: Normal range of motion.  Cardiovascular: Normal heart sounds.   Respiratory: Effort normal and breath sounds normal.  GI: Soft. Bowel sounds are normal.  Musculoskeletal:  Normal range of motion.  Neurological: He is alert and oriented to person, place, and time.  Skin: Skin is warm and dry.  Psychiatric: He has a normal mood and affect. His behavior is normal. Judgment and thought content normal.    Encounter Medications:   Outpatient Encounter Prescriptions as of 03/15/2016  Medication Sig  . Cholecalciferol (VITAMIN D3) 1000 UNITS CAPS Take 1 capsule by mouth daily.   Marland Kitchen ezetimibe (ZETIA) 10 MG tablet Take 1 tablet (10 mg total) by mouth daily.  Marland Kitchen levETIRAcetam (KEPPRA) 500 MG tablet Take 1 tablet (500 mg total) by mouth 2 (two) times daily.  . Menthol, Topical Analgesic, (BIOFREEZE) 4 % GEL Apply 1 application topically every 4 (four) hours as needed. Apply to chest wall  . methocarbamol (ROBAXIN) 500 MG tablet Take 500 mg by mouth every 8 (eight) hours as needed for muscle spasms.   . metoprolol tartrate (LOPRESSOR) 25 MG tablet TAKE 1/2 TABLET BY MOUTH TWICE A DAY (Patient taking differently: TAKE 112.5mg   BY MOUTH TWICE A DAY)  . oxyCODONE (OXY IR/ROXICODONE) 5 MG immediate release tablet 1 tablet by mouth every 6 hours as needed for moderate to severe pain  . phenytoin (DILANTIN) 100 MG ER capsule TAKE 3 CAPSULES  (300MG ) AT BEDTIME  . SPIRIVA HANDIHALER 18 MCG inhalation capsule INHALE THE CONTENTS OF 1 CAPSULE EVERY DAY  VIA  HANDIHALER  . tamsulosin (FLOMAX) 0.4 MG CAPS capsule Take 1 capsule (0.4 mg total) by mouth daily after breakfast.  . temazepam (RESTORIL) 15 MG capsule Take 15 mg by mouth at bedtime as  needed. sleep  . warfarin (COUMADIN) 6 MG tablet Take as directed by coumadin clinic  . sennosides-docusate sodium (SENOKOT-S) 8.6-50 MG tablet Take 2 tablets by mouth 2 (two) times daily.  Marland Kitchen zolpidem (AMBIEN) 5 MG tablet Take 1 tablet (5 mg total) by mouth at bedtime as needed for sleep. For insomnia. (Patient not taking: Reported on 03/15/2016)   No facility-administered encounter medications on file as of 03/15/2016.     Functional Status:    In your present state of health, do you have any difficulty performing the following activities: 03/15/2016 12/28/2015  Hearing? N Y  Vision? N N  Difficulty concentrating or making decisions? N Y  Walking or climbing stairs? Y Y  Dressing or bathing? N N  Doing errands, shopping? N N  Preparing Food and eating ? N -  Using the Toilet? N -  In the past six months, have you accidently leaked urine? N -  Do you have problems with loss of bowel control? N -  Managing your Medications? N -  Managing your Finances? N -  Housekeeping or managing your Housekeeping? N -  Some recent data might be hidden  BP 102/62 (BP Location: Left Arm, Patient Position: Sitting, Cuff Size: Normal)   Pulse 78   Resp 20   Ht 1.753 m (5\' 9" )   Wt 165 lb (74.8 kg)   SpO2 97%   BMI 24.37 kg/m    Fall/Depression Screening:    PHQ 2/9 Scores 03/15/2016 03/02/2016 06/09/2015 06/03/2015 05/27/2015 05/12/2015 07/15/2013  PHQ - 2 Score 0 1 0 0 0 0 0  PHQ- 9 Score - - - - - - 1    Assessment:   Introduction to the [facility] program and services Safety related to falls and seizure history COPD related to pt's medical history Adherence related to  Appointments Adherence related to Medications  Plan:  Will complete a physical assessment and obtained a signed consent based upon pt's enrollment into the [facility] program and community case management.  Will verify pt is using all provided and recommended DME to prevent falls and/or other related injuries. Will encouraged pt to use his rolling walker at all times both inside the outside the home.  Will verify pt has not had any seizures since his discharge from the SNF.  Safety evaluation completed within the home with no high risk areas in the home found today. Pt aware to clear hallways pathways and have lighted area throughout the home to avoid risk of falls. Educational EMMI material provided for "Preventing Falls". Will provide other information on COPD based upon pt's history  with this medical condition. Will review and discussed the COPD action Plan ( pt with understanding) Will verified pt has attended all appointments and assist pt with seeking another primary provider. RN provide pt a list of provider offices and encouraged to contact to seek first available with a provide who is taking new patients. Currently awaiting on a call back from the provider's office to verify if pt's insurance is acceptable (Dr. Marisue Humble at [facility]).  Will verify pt is taking all his medications as provider to avoid seizures and prevent COPD exacerbation episodes from occurring. Will continue to encouraged adherence. Plan of care discussed along with goals that are in place. Will verify pt's understanding of the information discussed today and follow up next month (pt requested telephone call) to see if he has established a primary provider and ongoing adherence with medications and medical appointments.     Lattie Haw  Zigmund Daniel, RN Care Management Coordinator [place] Office [phone]

## 2016-03-16 DIAGNOSIS — I5032 Chronic diastolic (congestive) heart failure: Secondary | ICD-10-CM | POA: Diagnosis not present

## 2016-03-16 DIAGNOSIS — I482 Chronic atrial fibrillation: Secondary | ICD-10-CM | POA: Diagnosis not present

## 2016-03-16 DIAGNOSIS — J449 Chronic obstructive pulmonary disease, unspecified: Secondary | ICD-10-CM | POA: Diagnosis not present

## 2016-03-16 DIAGNOSIS — G40909 Epilepsy, unspecified, not intractable, without status epilepticus: Secondary | ICD-10-CM | POA: Diagnosis not present

## 2016-03-16 DIAGNOSIS — I429 Cardiomyopathy, unspecified: Secondary | ICD-10-CM | POA: Diagnosis not present

## 2016-03-16 DIAGNOSIS — M160 Bilateral primary osteoarthritis of hip: Secondary | ICD-10-CM | POA: Diagnosis not present

## 2016-03-16 DIAGNOSIS — D509 Iron deficiency anemia, unspecified: Secondary | ICD-10-CM | POA: Diagnosis not present

## 2016-03-16 DIAGNOSIS — I110 Hypertensive heart disease with heart failure: Secondary | ICD-10-CM | POA: Diagnosis not present

## 2016-03-16 DIAGNOSIS — K219 Gastro-esophageal reflux disease without esophagitis: Secondary | ICD-10-CM | POA: Diagnosis not present

## 2016-03-20 DIAGNOSIS — I482 Chronic atrial fibrillation: Secondary | ICD-10-CM | POA: Diagnosis not present

## 2016-03-20 DIAGNOSIS — K219 Gastro-esophageal reflux disease without esophagitis: Secondary | ICD-10-CM | POA: Diagnosis not present

## 2016-03-20 DIAGNOSIS — I429 Cardiomyopathy, unspecified: Secondary | ICD-10-CM | POA: Diagnosis not present

## 2016-03-20 DIAGNOSIS — J449 Chronic obstructive pulmonary disease, unspecified: Secondary | ICD-10-CM | POA: Diagnosis not present

## 2016-03-20 DIAGNOSIS — I110 Hypertensive heart disease with heart failure: Secondary | ICD-10-CM | POA: Diagnosis not present

## 2016-03-20 DIAGNOSIS — M160 Bilateral primary osteoarthritis of hip: Secondary | ICD-10-CM | POA: Diagnosis not present

## 2016-03-20 DIAGNOSIS — D509 Iron deficiency anemia, unspecified: Secondary | ICD-10-CM | POA: Diagnosis not present

## 2016-03-20 DIAGNOSIS — I5032 Chronic diastolic (congestive) heart failure: Secondary | ICD-10-CM | POA: Diagnosis not present

## 2016-03-20 DIAGNOSIS — G40909 Epilepsy, unspecified, not intractable, without status epilepticus: Secondary | ICD-10-CM | POA: Diagnosis not present

## 2016-03-21 ENCOUNTER — Other Ambulatory Visit: Payer: Self-pay | Admitting: *Deleted

## 2016-03-21 MED ORDER — LEVETIRACETAM 500 MG PO TABS: 500.0000 mg | ORAL_TABLET | Freq: Two times a day (BID) | ORAL | 1 refills | Status: DC

## 2016-03-21 NOTE — Telephone Encounter (Signed)
Done

## 2016-03-22 DIAGNOSIS — I429 Cardiomyopathy, unspecified: Secondary | ICD-10-CM | POA: Diagnosis not present

## 2016-03-22 DIAGNOSIS — G40909 Epilepsy, unspecified, not intractable, without status epilepticus: Secondary | ICD-10-CM | POA: Diagnosis not present

## 2016-03-22 DIAGNOSIS — I110 Hypertensive heart disease with heart failure: Secondary | ICD-10-CM | POA: Diagnosis not present

## 2016-03-22 DIAGNOSIS — K219 Gastro-esophageal reflux disease without esophagitis: Secondary | ICD-10-CM | POA: Diagnosis not present

## 2016-03-22 DIAGNOSIS — I5032 Chronic diastolic (congestive) heart failure: Secondary | ICD-10-CM | POA: Diagnosis not present

## 2016-03-22 DIAGNOSIS — M160 Bilateral primary osteoarthritis of hip: Secondary | ICD-10-CM | POA: Diagnosis not present

## 2016-03-22 DIAGNOSIS — I482 Chronic atrial fibrillation: Secondary | ICD-10-CM | POA: Diagnosis not present

## 2016-03-22 DIAGNOSIS — J449 Chronic obstructive pulmonary disease, unspecified: Secondary | ICD-10-CM | POA: Diagnosis not present

## 2016-03-22 DIAGNOSIS — D509 Iron deficiency anemia, unspecified: Secondary | ICD-10-CM | POA: Diagnosis not present

## 2016-03-23 DIAGNOSIS — I10 Essential (primary) hypertension: Secondary | ICD-10-CM | POA: Diagnosis not present

## 2016-03-23 DIAGNOSIS — G40909 Epilepsy, unspecified, not intractable, without status epilepticus: Secondary | ICD-10-CM | POA: Diagnosis not present

## 2016-03-23 DIAGNOSIS — M62838 Other muscle spasm: Secondary | ICD-10-CM | POA: Diagnosis not present

## 2016-03-23 DIAGNOSIS — M150 Primary generalized (osteo)arthritis: Secondary | ICD-10-CM | POA: Diagnosis not present

## 2016-03-23 DIAGNOSIS — Z79899 Other long term (current) drug therapy: Secondary | ICD-10-CM | POA: Diagnosis not present

## 2016-03-23 DIAGNOSIS — Z7901 Long term (current) use of anticoagulants: Secondary | ICD-10-CM | POA: Diagnosis not present

## 2016-03-26 DIAGNOSIS — G8929 Other chronic pain: Secondary | ICD-10-CM | POA: Diagnosis not present

## 2016-03-26 DIAGNOSIS — M791 Myalgia: Secondary | ICD-10-CM | POA: Diagnosis not present

## 2016-03-26 DIAGNOSIS — M533 Sacrococcygeal disorders, not elsewhere classified: Secondary | ICD-10-CM | POA: Diagnosis not present

## 2016-03-26 DIAGNOSIS — M5442 Lumbago with sciatica, left side: Secondary | ICD-10-CM | POA: Diagnosis not present

## 2016-03-26 DIAGNOSIS — G894 Chronic pain syndrome: Secondary | ICD-10-CM | POA: Diagnosis not present

## 2016-03-28 ENCOUNTER — Ambulatory Visit (INDEPENDENT_AMBULATORY_CARE_PROVIDER_SITE_OTHER): Payer: Commercial Managed Care - HMO | Admitting: *Deleted

## 2016-03-28 DIAGNOSIS — Z5181 Encounter for therapeutic drug level monitoring: Secondary | ICD-10-CM

## 2016-03-28 DIAGNOSIS — I4891 Unspecified atrial fibrillation: Secondary | ICD-10-CM

## 2016-03-28 LAB — POCT INR: INR: 3.3

## 2016-03-30 DIAGNOSIS — S32401A Unspecified fracture of right acetabulum, initial encounter for closed fracture: Secondary | ICD-10-CM | POA: Diagnosis not present

## 2016-03-30 DIAGNOSIS — M6281 Muscle weakness (generalized): Secondary | ICD-10-CM | POA: Diagnosis not present

## 2016-03-30 DIAGNOSIS — R262 Difficulty in walking, not elsewhere classified: Secondary | ICD-10-CM | POA: Diagnosis not present

## 2016-03-30 DIAGNOSIS — I4891 Unspecified atrial fibrillation: Secondary | ICD-10-CM | POA: Diagnosis not present

## 2016-04-09 DIAGNOSIS — C61 Malignant neoplasm of prostate: Secondary | ICD-10-CM | POA: Diagnosis not present

## 2016-04-13 ENCOUNTER — Other Ambulatory Visit: Payer: Self-pay | Admitting: *Deleted

## 2016-04-13 NOTE — Patient Outreach (Signed)
[facility]) Care Management  04/13/2016  Jeffery Copeland Jun 05, 1946 [id number]   RN attempted outreach call today however unsuccessful. Will rescheduled another follow up contact. RN also left a HIPAA approved voice message requesting a call back of inquire. Will at that time inquire on pt's ongoing management of care.  Will reschedule another follow up call and await a possible call back to inquire further.   Raina Mina, RN Care Management Coordinator [place] Office [phone]

## 2016-04-16 ENCOUNTER — Other Ambulatory Visit: Payer: Self-pay | Admitting: *Deleted

## 2016-04-16 NOTE — Patient Outreach (Addendum)
[facility]) Care Management  04/16/2016  JOSHEPH NICHOLES February 27, 1946 [id number]   RN spoke with pt today who indicates he has established a new primary care doctor however Mcarthur Rossetti has requested certain paperwork. Pt reports the new doctor is Dr. Antony Blackbird with [facility]. Pt states he is doing "great" with his sister's assistance with no encountered problem. Denies any falls or injuries as pt remains safe using his assisted device when ambulating both inside and outside the home. Denies any issues with his breathing (COPD) and continues to take all her prescribed medications with no delays or changes at this time. RN offered a home visit however pt opt to decline indicating he is doing well. RN offered to follow up with another call next month with possible discharge if he continues to do well in managing his care (pt receptive) will schedule a follow up call accordingly. No other request or inquires at this time.  RN also contacted Dr. Chapman Fitch office and confirmed pt is an active pt with this establishment.  Raina Mina, RN Care Management Coordinator [place] Office [phone]

## 2016-04-18 ENCOUNTER — Ambulatory Visit (INDEPENDENT_AMBULATORY_CARE_PROVIDER_SITE_OTHER): Payer: Commercial Managed Care - HMO | Admitting: Adult Health

## 2016-04-18 ENCOUNTER — Encounter: Payer: Self-pay | Admitting: Adult Health

## 2016-04-18 VITALS — BP 140/80 | HR 74 | Ht 69.0 in | Wt 145.8 lb

## 2016-04-18 DIAGNOSIS — R569 Unspecified convulsions: Secondary | ICD-10-CM | POA: Diagnosis not present

## 2016-04-18 DIAGNOSIS — Z5181 Encounter for therapeutic drug level monitoring: Secondary | ICD-10-CM

## 2016-04-18 NOTE — Patient Instructions (Signed)
Continue Keppra and Dilantin Blood work today If your symptoms worsen or you develop new symptoms please let us know.

## 2016-04-18 NOTE — Progress Notes (Signed)
I have read the note, and I agree with the clinical assessment and plan.  WILLIS,CHARLES KEITH   
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
## 2016-04-19 LAB — COMPREHENSIVE METABOLIC PANEL
ALT: 8 IU/L (ref 0–44)
AST: 8 IU/L (ref 0–40)
Albumin/Globulin Ratio: 2 (ref 1.2–2.2)
Albumin: 4.2 g/dL (ref 3.5–4.8)
Alkaline Phosphatase: 161 IU/L — ABNORMAL HIGH (ref 39–117)
BILIRUBIN TOTAL: 0.4 mg/dL (ref 0.0–1.2)
BUN / CREAT RATIO: 8 — AB (ref 10–24)
BUN: 8 mg/dL (ref 8–27)
CHLORIDE: 103 mmol/L (ref 96–106)
CO2: 26 mmol/L (ref 18–29)
CREATININE: 1.04 mg/dL (ref 0.76–1.27)
Calcium: 9.1 mg/dL (ref 8.6–10.2)
GFR, EST AFRICAN AMERICAN: 84 mL/min/{1.73_m2} (ref >59)
GFR, EST NON AFRICAN AMERICAN: 72 mL/min/{1.73_m2} (ref >59)
Globulin, Total: 2.1 g/dL (ref 1.5–4.5)
Glucose: 89 mg/dL (ref 65–99)
Potassium: 4.1 mmol/L (ref 3.5–5.2)
Sodium: 142 mmol/L (ref 134–144)
TOTAL PROTEIN: 6.3 g/dL (ref 6.0–8.5)

## 2016-04-19 LAB — CBC WITH DIFFERENTIAL/PLATELET
BASOS: 1 %
Basophils Absolute: 0 10*3/uL (ref 0.0–0.2)
EOS (ABSOLUTE): 0.2 10*3/uL (ref 0.0–0.4)
EOS: 3 %
HEMATOCRIT: 39.6 % (ref 37.5–51.0)
HEMOGLOBIN: 12.8 g/dL (ref 12.6–17.7)
IMMATURE GRANS (ABS): 0 10*3/uL (ref 0.0–0.1)
Immature Granulocytes: 0 %
LYMPHS ABS: 0.7 10*3/uL (ref 0.7–3.1)
LYMPHS: 13 %
MCH: 31.3 pg (ref 26.6–33.0)
MCHC: 32.3 g/dL (ref 31.5–35.7)
MCV: 97 fL (ref 79–97)
MONOCYTES: 14 %
Monocytes Absolute: 0.8 10*3/uL (ref 0.1–0.9)
NEUTROS ABS: 3.8 10*3/uL (ref 1.4–7.0)
Neutrophils: 69 %
Platelets: 176 10*3/uL (ref 150–379)
RBC: 4.09 x10E6/uL — ABNORMAL LOW (ref 4.14–5.80)
RDW: 14.1 % (ref 12.3–15.4)
WBC: 5.4 10*3/uL (ref 3.4–10.8)

## 2016-04-19 LAB — PHENYTOIN LEVEL, TOTAL: Phenytoin (Dilantin), Serum: 6.7 ug/mL — ABNORMAL LOW (ref 10.0–20.0)

## 2016-04-20 ENCOUNTER — Telehealth: Payer: Self-pay | Admitting: *Deleted

## 2016-04-20 NOTE — Telephone Encounter (Signed)
-----   Message from Ward Givens, NP sent at 04/19/2016  8:30 AM EDT ----- Lab work ok. Alkaline phosphatase is slightly elevated but it is actually decreased from blood work 3 months ago. Please call patient.

## 2016-04-20 NOTE — Telephone Encounter (Signed)
Spoke to pt and let him know lab results.   He verbalized understanding. He had no questions.

## 2016-04-25 ENCOUNTER — Ambulatory Visit (INDEPENDENT_AMBULATORY_CARE_PROVIDER_SITE_OTHER): Payer: Commercial Managed Care - HMO | Admitting: Pharmacist

## 2016-04-25 DIAGNOSIS — Z5181 Encounter for therapeutic drug level monitoring: Secondary | ICD-10-CM | POA: Diagnosis not present

## 2016-04-25 DIAGNOSIS — I4891 Unspecified atrial fibrillation: Secondary | ICD-10-CM

## 2016-04-25 LAB — POCT INR: INR: 2.3

## 2016-04-29 DIAGNOSIS — I4891 Unspecified atrial fibrillation: Secondary | ICD-10-CM | POA: Diagnosis not present

## 2016-04-29 DIAGNOSIS — M6281 Muscle weakness (generalized): Secondary | ICD-10-CM | POA: Diagnosis not present

## 2016-04-29 DIAGNOSIS — S32401A Unspecified fracture of right acetabulum, initial encounter for closed fracture: Secondary | ICD-10-CM | POA: Diagnosis not present

## 2016-04-29 DIAGNOSIS — R262 Difficulty in walking, not elsewhere classified: Secondary | ICD-10-CM | POA: Diagnosis not present

## 2016-05-07 DIAGNOSIS — Z961 Presence of intraocular lens: Secondary | ICD-10-CM | POA: Diagnosis not present

## 2016-05-07 DIAGNOSIS — H353131 Nonexudative age-related macular degeneration, bilateral, early dry stage: Secondary | ICD-10-CM | POA: Diagnosis not present

## 2016-05-10 ENCOUNTER — Ambulatory Visit: Payer: Commercial Managed Care - HMO | Admitting: Adult Health

## 2016-05-14 ENCOUNTER — Other Ambulatory Visit: Payer: Self-pay | Admitting: *Deleted

## 2016-05-14 ENCOUNTER — Encounter: Payer: Self-pay | Admitting: *Deleted

## 2016-05-14 NOTE — Patient Outreach (Signed)
[facility]) Care Management  05/14/2016  Jeffery Copeland 1945/12/07 [id number]  RN outreached to pt today who continues to do well. RN confirms pt remains in the GREEN zone with no acute events related to his COPD however pt mentioned having a "knot" on his elbow. Denies any swelling or pain and reported it's been a week  Since it has appeared. RN strongly encouraged pt to see his medical provider for an office visit for more intervention (pt indicated he would call). No other reported issues at this time. RN once again extended offer if needed in the future for case management services with [facility]  (pt with understanding). Based upon today's telephonic assessment all goals have been met via plan of care for pt in managing hid COPD. Pt taking all prescribed medications with no problems and attending all scheduled appointments with sufficient transportation. No other needs at this time as RN will close this case from further [facility] services. Note pt has also declined a Engineer, maintenance and feels he is able to manage his ongoing medical issues independently along with his supportive family. Will notify pt's provider and [place] office of pt's case closure.  Raina Mina, RN Care Management Coordinator [place] Office [phone]

## 2016-05-16 ENCOUNTER — Ambulatory Visit (INDEPENDENT_AMBULATORY_CARE_PROVIDER_SITE_OTHER): Payer: Commercial Managed Care - HMO | Admitting: *Deleted

## 2016-05-16 DIAGNOSIS — Z5181 Encounter for therapeutic drug level monitoring: Secondary | ICD-10-CM | POA: Diagnosis not present

## 2016-05-16 DIAGNOSIS — I4891 Unspecified atrial fibrillation: Secondary | ICD-10-CM

## 2016-05-16 LAB — POCT INR: INR: 3.2

## 2016-05-21 DIAGNOSIS — Z23 Encounter for immunization: Secondary | ICD-10-CM | POA: Diagnosis not present

## 2016-05-21 DIAGNOSIS — G8929 Other chronic pain: Secondary | ICD-10-CM | POA: Diagnosis not present

## 2016-05-21 DIAGNOSIS — M25521 Pain in right elbow: Secondary | ICD-10-CM | POA: Diagnosis not present

## 2016-05-21 DIAGNOSIS — M2550 Pain in unspecified joint: Secondary | ICD-10-CM | POA: Diagnosis not present

## 2016-05-22 DIAGNOSIS — Z8546 Personal history of malignant neoplasm of prostate: Secondary | ICD-10-CM | POA: Diagnosis not present

## 2016-05-30 DIAGNOSIS — R262 Difficulty in walking, not elsewhere classified: Secondary | ICD-10-CM | POA: Diagnosis not present

## 2016-05-30 DIAGNOSIS — M6281 Muscle weakness (generalized): Secondary | ICD-10-CM | POA: Diagnosis not present

## 2016-05-30 DIAGNOSIS — S32401A Unspecified fracture of right acetabulum, initial encounter for closed fracture: Secondary | ICD-10-CM | POA: Diagnosis not present

## 2016-05-30 DIAGNOSIS — I4891 Unspecified atrial fibrillation: Secondary | ICD-10-CM | POA: Diagnosis not present

## 2016-06-26 ENCOUNTER — Ambulatory Visit (INDEPENDENT_AMBULATORY_CARE_PROVIDER_SITE_OTHER): Payer: Commercial Managed Care - HMO | Admitting: Orthopaedic Surgery

## 2016-06-27 ENCOUNTER — Telehealth: Payer: Self-pay | Admitting: Pulmonary Disease

## 2016-06-27 MED ORDER — TIOTROPIUM BROMIDE MONOHYDRATE 18 MCG IN CAPS: ORAL_CAPSULE | RESPIRATORY_TRACT | 3 refills | Status: DC

## 2016-06-27 NOTE — Telephone Encounter (Signed)
Spoke with pt who is requesting refills on spiriva. Rx has been sent to preferred pharmacy. Pt aware & voiced understanding. Nothing further needed.

## 2016-06-29 DIAGNOSIS — M6281 Muscle weakness (generalized): Secondary | ICD-10-CM | POA: Diagnosis not present

## 2016-06-29 DIAGNOSIS — I4891 Unspecified atrial fibrillation: Secondary | ICD-10-CM | POA: Diagnosis not present

## 2016-06-29 DIAGNOSIS — R262 Difficulty in walking, not elsewhere classified: Secondary | ICD-10-CM | POA: Diagnosis not present

## 2016-06-29 DIAGNOSIS — S32401A Unspecified fracture of right acetabulum, initial encounter for closed fracture: Secondary | ICD-10-CM | POA: Diagnosis not present

## 2016-07-02 ENCOUNTER — Telehealth: Payer: Self-pay | Admitting: Neurology

## 2016-07-02 NOTE — Telephone Encounter (Signed)
Patient called and requested a refill his Levetireacetam rx. He states that he will be out of the medication tomorrow. He would also like for it to go to [facility] listed on file.

## 2016-07-02 NOTE — Telephone Encounter (Signed)
LVM on patient mobile number. Advised unable to reach on home number. Gave [facility] phone number for call back.

## 2016-07-02 NOTE — Telephone Encounter (Signed)
Tried calling patient back. Got busy signal. Unable to LVM.  Checked and Dr Leonie Man last sent rx to [facility] on 03/21/16 for 3 month supply with one refill. He should have refills left.

## 2016-07-03 MED ORDER — LEVETIRACETAM 500 MG PO TABS: 500.0000 mg | ORAL_TABLET | Freq: Two times a day (BID) | ORAL | 0 refills | Status: DC

## 2016-07-03 NOTE — Telephone Encounter (Addendum)
I called and spoke to Boykins, at [facility].  There is a refill of 180tabs remaining for the keppra.  She will go ahead and fill this for pt .  Will take 5-7 days.  Will order for 10 days as this is his last day.  Escribed to [facility].  Pt made aware of plan.

## 2016-07-11 ENCOUNTER — Ambulatory Visit (INDEPENDENT_AMBULATORY_CARE_PROVIDER_SITE_OTHER): Payer: Commercial Managed Care - HMO | Admitting: *Deleted

## 2016-07-11 DIAGNOSIS — I4891 Unspecified atrial fibrillation: Secondary | ICD-10-CM | POA: Diagnosis not present

## 2016-07-11 DIAGNOSIS — Z5181 Encounter for therapeutic drug level monitoring: Secondary | ICD-10-CM

## 2016-07-11 LAB — POCT INR: INR: 2.6

## 2016-07-11 MED ORDER — WARFARIN SODIUM 6 MG PO TABS: ORAL_TABLET | ORAL | 0 refills | Status: DC

## 2016-07-30 DIAGNOSIS — R262 Difficulty in walking, not elsewhere classified: Secondary | ICD-10-CM | POA: Diagnosis not present

## 2016-07-30 DIAGNOSIS — M6281 Muscle weakness (generalized): Secondary | ICD-10-CM | POA: Diagnosis not present

## 2016-07-30 DIAGNOSIS — S32401A Unspecified fracture of right acetabulum, initial encounter for closed fracture: Secondary | ICD-10-CM | POA: Diagnosis not present

## 2016-07-30 DIAGNOSIS — I4891 Unspecified atrial fibrillation: Secondary | ICD-10-CM | POA: Diagnosis not present

## 2016-08-08 ENCOUNTER — Ambulatory Visit (INDEPENDENT_AMBULATORY_CARE_PROVIDER_SITE_OTHER): Payer: Commercial Managed Care - HMO | Admitting: *Deleted

## 2016-08-08 DIAGNOSIS — I4891 Unspecified atrial fibrillation: Secondary | ICD-10-CM

## 2016-08-08 DIAGNOSIS — R768 Other specified abnormal immunological findings in serum: Secondary | ICD-10-CM | POA: Diagnosis not present

## 2016-08-08 DIAGNOSIS — Z6821 Body mass index (BMI) 21.0-21.9, adult: Secondary | ICD-10-CM | POA: Diagnosis not present

## 2016-08-08 DIAGNOSIS — M2550 Pain in unspecified joint: Secondary | ICD-10-CM | POA: Diagnosis not present

## 2016-08-08 DIAGNOSIS — M25521 Pain in right elbow: Secondary | ICD-10-CM | POA: Diagnosis not present

## 2016-08-08 DIAGNOSIS — Z5181 Encounter for therapeutic drug level monitoring: Secondary | ICD-10-CM

## 2016-08-08 DIAGNOSIS — M7989 Other specified soft tissue disorders: Secondary | ICD-10-CM | POA: Diagnosis not present

## 2016-08-08 DIAGNOSIS — M150 Primary generalized (osteo)arthritis: Secondary | ICD-10-CM | POA: Diagnosis not present

## 2016-08-08 DIAGNOSIS — M5136 Other intervertebral disc degeneration, lumbar region: Secondary | ICD-10-CM | POA: Diagnosis not present

## 2016-08-08 LAB — POCT INR: INR: 1.1

## 2016-08-14 DIAGNOSIS — G40909 Epilepsy, unspecified, not intractable, without status epilepticus: Secondary | ICD-10-CM | POA: Diagnosis not present

## 2016-08-14 DIAGNOSIS — I4891 Unspecified atrial fibrillation: Secondary | ICD-10-CM | POA: Diagnosis not present

## 2016-08-14 DIAGNOSIS — J449 Chronic obstructive pulmonary disease, unspecified: Secondary | ICD-10-CM | POA: Diagnosis not present

## 2016-08-14 DIAGNOSIS — M1A9XX1 Chronic gout, unspecified, with tophus (tophi): Secondary | ICD-10-CM | POA: Diagnosis not present

## 2016-08-14 DIAGNOSIS — I10 Essential (primary) hypertension: Secondary | ICD-10-CM | POA: Diagnosis not present

## 2016-08-14 DIAGNOSIS — Z7901 Long term (current) use of anticoagulants: Secondary | ICD-10-CM | POA: Diagnosis not present

## 2016-08-14 DIAGNOSIS — Z8673 Personal history of transient ischemic attack (TIA), and cerebral infarction without residual deficits: Secondary | ICD-10-CM | POA: Diagnosis not present

## 2016-08-14 DIAGNOSIS — M2550 Pain in unspecified joint: Secondary | ICD-10-CM | POA: Diagnosis not present

## 2016-08-14 DIAGNOSIS — Z952 Presence of prosthetic heart valve: Secondary | ICD-10-CM | POA: Diagnosis not present

## 2016-08-15 ENCOUNTER — Ambulatory Visit: Payer: Commercial Managed Care - HMO | Admitting: Pulmonary Disease

## 2016-08-21 ENCOUNTER — Ambulatory Visit (INDEPENDENT_AMBULATORY_CARE_PROVIDER_SITE_OTHER): Payer: Medicare HMO | Admitting: *Deleted

## 2016-08-21 DIAGNOSIS — Z5181 Encounter for therapeutic drug level monitoring: Secondary | ICD-10-CM

## 2016-08-21 DIAGNOSIS — I4891 Unspecified atrial fibrillation: Secondary | ICD-10-CM

## 2016-08-21 LAB — POCT INR: INR: 2.9

## 2016-08-30 DIAGNOSIS — M6281 Muscle weakness (generalized): Secondary | ICD-10-CM | POA: Diagnosis not present

## 2016-08-30 DIAGNOSIS — R262 Difficulty in walking, not elsewhere classified: Secondary | ICD-10-CM | POA: Diagnosis not present

## 2016-08-30 DIAGNOSIS — I4891 Unspecified atrial fibrillation: Secondary | ICD-10-CM | POA: Diagnosis not present

## 2016-08-30 DIAGNOSIS — S32401A Unspecified fracture of right acetabulum, initial encounter for closed fracture: Secondary | ICD-10-CM | POA: Diagnosis not present

## 2016-09-04 ENCOUNTER — Ambulatory Visit (INDEPENDENT_AMBULATORY_CARE_PROVIDER_SITE_OTHER): Payer: Medicare HMO | Admitting: Pharmacist

## 2016-09-04 ENCOUNTER — Other Ambulatory Visit: Payer: Self-pay | Admitting: Neurology

## 2016-09-04 DIAGNOSIS — Z5181 Encounter for therapeutic drug level monitoring: Secondary | ICD-10-CM | POA: Diagnosis not present

## 2016-09-04 DIAGNOSIS — I4891 Unspecified atrial fibrillation: Secondary | ICD-10-CM | POA: Diagnosis not present

## 2016-09-04 LAB — POCT INR: INR: 3.1

## 2016-09-05 ENCOUNTER — Ambulatory Visit: Payer: Medicare HMO | Admitting: Pulmonary Disease

## 2016-09-05 ENCOUNTER — Other Ambulatory Visit: Payer: Self-pay

## 2016-09-05 MED ORDER — LEVETIRACETAM 500 MG PO TABS: 500.0000 mg | ORAL_TABLET | Freq: Two times a day (BID) | ORAL | 2 refills | Status: DC

## 2016-09-05 NOTE — Telephone Encounter (Signed)
Refill done for keppra.

## 2016-09-07 ENCOUNTER — Encounter: Payer: Self-pay | Admitting: Pulmonary Disease

## 2016-09-12 ENCOUNTER — Telehealth: Payer: Self-pay | Admitting: Pulmonary Disease

## 2016-09-12 MED ORDER — TIOTROPIUM BROMIDE MONOHYDRATE 18 MCG IN CAPS
ORAL_CAPSULE | RESPIRATORY_TRACT | 0 refills | Status: DC
Start: 2016-09-12 — End: 2016-11-20

## 2016-09-12 NOTE — Telephone Encounter (Signed)
Spoke with pt. He is needing a refill on Spiriva sent to [facility]. Pt has an upcoming appointment with JN in 11/06/16. Rx has been sent in. Nothing further was needed.

## 2016-09-20 ENCOUNTER — Other Ambulatory Visit: Payer: Self-pay | Admitting: Cardiology

## 2016-09-27 DIAGNOSIS — R262 Difficulty in walking, not elsewhere classified: Secondary | ICD-10-CM | POA: Diagnosis not present

## 2016-09-27 DIAGNOSIS — M6281 Muscle weakness (generalized): Secondary | ICD-10-CM | POA: Diagnosis not present

## 2016-09-27 DIAGNOSIS — I4891 Unspecified atrial fibrillation: Secondary | ICD-10-CM | POA: Diagnosis not present

## 2016-09-27 DIAGNOSIS — S32401A Unspecified fracture of right acetabulum, initial encounter for closed fracture: Secondary | ICD-10-CM | POA: Diagnosis not present

## 2016-10-02 ENCOUNTER — Ambulatory Visit (INDEPENDENT_AMBULATORY_CARE_PROVIDER_SITE_OTHER): Payer: Medicare HMO | Admitting: *Deleted

## 2016-10-02 DIAGNOSIS — I4891 Unspecified atrial fibrillation: Secondary | ICD-10-CM | POA: Diagnosis not present

## 2016-10-02 DIAGNOSIS — Z5181 Encounter for therapeutic drug level monitoring: Secondary | ICD-10-CM | POA: Diagnosis not present

## 2016-10-02 LAB — POCT INR: INR: 3.4

## 2016-10-08 DIAGNOSIS — M5136 Other intervertebral disc degeneration, lumbar region: Secondary | ICD-10-CM | POA: Diagnosis not present

## 2016-10-08 DIAGNOSIS — M150 Primary generalized (osteo)arthritis: Secondary | ICD-10-CM | POA: Diagnosis not present

## 2016-10-08 DIAGNOSIS — Z6821 Body mass index (BMI) 21.0-21.9, adult: Secondary | ICD-10-CM | POA: Diagnosis not present

## 2016-10-08 DIAGNOSIS — M1A09X1 Idiopathic chronic gout, multiple sites, with tophus (tophi): Secondary | ICD-10-CM | POA: Diagnosis not present

## 2016-10-22 ENCOUNTER — Ambulatory Visit (INDEPENDENT_AMBULATORY_CARE_PROVIDER_SITE_OTHER): Payer: Medicare HMO | Admitting: Neurology

## 2016-10-22 ENCOUNTER — Encounter: Payer: Self-pay | Admitting: Neurology

## 2016-10-22 VITALS — BP 151/81 | HR 62 | Wt 151.2 lb

## 2016-10-22 DIAGNOSIS — G40909 Epilepsy, unspecified, not intractable, without status epilepticus: Secondary | ICD-10-CM
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
## 2016-10-22 NOTE — Patient Instructions (Signed)
I had a long discussion the patient with regards to his seizures and discuss the need to be compliant with his seizure medications. Continue Keppra 500 mg twice daily and Dilantin 300 mg at night. Patient is doing well and did not see any reason to change his medications will check any lab work today. He was counseled to use his walker at all times and to avoid seizure provoking stimuli like sleep deprivation, medication noncompliance. He will return for follow-up in 6 months with minus practitioner call earlier if necessary

## 2016-10-28 DIAGNOSIS — S32401A Unspecified fracture of right acetabulum, initial encounter for closed fracture: Secondary | ICD-10-CM | POA: Diagnosis not present

## 2016-10-28 DIAGNOSIS — M6281 Muscle weakness (generalized): Secondary | ICD-10-CM | POA: Diagnosis not present

## 2016-10-28 DIAGNOSIS — I4891 Unspecified atrial fibrillation: Secondary | ICD-10-CM | POA: Diagnosis not present

## 2016-10-28 DIAGNOSIS — R262 Difficulty in walking, not elsewhere classified: Secondary | ICD-10-CM | POA: Diagnosis not present

## 2016-11-05 ENCOUNTER — Other Ambulatory Visit: Payer: Self-pay | Admitting: Pulmonary Disease

## 2016-11-05 DIAGNOSIS — R0600 Dyspnea, unspecified: Secondary | ICD-10-CM

## 2016-11-06 ENCOUNTER — Ambulatory Visit (INDEPENDENT_AMBULATORY_CARE_PROVIDER_SITE_OTHER): Payer: Medicare HMO | Admitting: Pulmonary Disease

## 2016-11-06 ENCOUNTER — Ambulatory Visit: Payer: Medicare HMO | Admitting: Pulmonary Disease

## 2016-11-06 DIAGNOSIS — R0600 Dyspnea, unspecified: Secondary | ICD-10-CM | POA: Diagnosis not present

## 2016-11-06 DIAGNOSIS — R0602 Shortness of breath: Secondary | ICD-10-CM

## 2016-11-06 LAB — PULMONARY FUNCTION TEST
DL/VA % PRED: 64 %
DL/VA: 2.75 ml/min/mmHg/L
DLCO cor % pred: 38 %
DLCO cor: 9.81 ml/min/mmHg
DLCO unc % pred: 35 %
DLCO unc: 9.07 ml/min/mmHg
FEF 25-75 POST: 0.76 L/s
FEF 25-75 PRE: 0.65 L/s
FEF2575-%CHANGE-POST: 16 %
FEF2575-%PRED-POST: 38 %
FEF2575-%PRED-PRE: 32 %
FEV1-%Change-Post: 5 %
FEV1-%Pred-Post: 61 %
FEV1-%Pred-Pre: 58 %
FEV1-POST: 1.6 L
FEV1-Pre: 1.52 L
FEV1FVC-%Change-Post: 3 %
FEV1FVC-%PRED-PRE: 81 %
FEV6-%Change-Post: 2 %
FEV6-%PRED-POST: 75 %
FEV6-%Pred-Pre: 73 %
FEV6-PRE: 2.47 L
FEV6-Post: 2.54 L
FEV6FVC-%CHANGE-POST: 0 %
FEV6FVC-%Pred-Post: 103 %
FEV6FVC-%Pred-Pre: 102 %
FVC-%Change-Post: 1 %
FVC-%PRED-POST: 72 %
FVC-%Pred-Pre: 71 %
FVC-Post: 2.62 L
FVC-Pre: 2.57 L
POST FEV1/FVC RATIO: 61 %
PRE FEV1/FVC RATIO: 59 %
Post FEV6/FVC ratio: 97 %
Pre FEV6/FVC Ratio: 96 %

## 2016-11-06 NOTE — Progress Notes (Signed)
PFT done today. 

## 2016-11-07 NOTE — Progress Notes (Signed)
6MWT 11/06/16:  Walked 220 meters / Baseline Sat 99% on RA / Nadir Sat 94% on RA

## 2016-11-08 ENCOUNTER — Ambulatory Visit: Payer: Medicare HMO | Admitting: Pulmonary Disease

## 2016-11-13 ENCOUNTER — Ambulatory Visit (INDEPENDENT_AMBULATORY_CARE_PROVIDER_SITE_OTHER): Payer: Medicare HMO | Admitting: *Deleted

## 2016-11-13 DIAGNOSIS — Z5181 Encounter for therapeutic drug level monitoring: Secondary | ICD-10-CM

## 2016-11-13 DIAGNOSIS — I4891 Unspecified atrial fibrillation: Secondary | ICD-10-CM

## 2016-11-13 LAB — POCT INR: INR: 3.8

## 2016-11-19 ENCOUNTER — Ambulatory Visit: Payer: Medicare HMO | Admitting: Pulmonary Disease

## 2016-11-19 ENCOUNTER — Other Ambulatory Visit: Payer: Self-pay | Admitting: Pulmonary Disease

## 2016-11-21 DIAGNOSIS — Z8546 Personal history of malignant neoplasm of prostate: Secondary | ICD-10-CM | POA: Diagnosis not present

## 2016-11-21 LAB — PSA: PSA: 0.14

## 2016-11-23 ENCOUNTER — Other Ambulatory Visit: Payer: Self-pay | Admitting: Cardiology

## 2016-11-26 DIAGNOSIS — Z8546 Personal history of malignant neoplasm of prostate: Secondary | ICD-10-CM | POA: Diagnosis not present

## 2016-11-26 DIAGNOSIS — N5201 Erectile dysfunction due to arterial insufficiency: Secondary | ICD-10-CM | POA: Diagnosis not present

## 2016-11-27 ENCOUNTER — Ambulatory Visit (INDEPENDENT_AMBULATORY_CARE_PROVIDER_SITE_OTHER): Payer: Medicare HMO | Admitting: Adult Health

## 2016-11-27 ENCOUNTER — Encounter: Payer: Self-pay | Admitting: Adult Health

## 2016-11-27 DIAGNOSIS — J438 Other emphysema: Secondary | ICD-10-CM | POA: Diagnosis not present

## 2016-11-27 DIAGNOSIS — M6281 Muscle weakness (generalized): Secondary | ICD-10-CM | POA: Diagnosis not present

## 2016-11-27 DIAGNOSIS — S32401A Unspecified fracture of right acetabulum, initial encounter for closed fracture: Secondary | ICD-10-CM | POA: Diagnosis not present

## 2016-11-27 DIAGNOSIS — R262 Difficulty in walking, not elsewhere classified: Secondary | ICD-10-CM | POA: Diagnosis not present

## 2016-11-27 DIAGNOSIS — I4891 Unspecified atrial fibrillation: Secondary | ICD-10-CM | POA: Diagnosis not present

## 2016-11-27 NOTE — Progress Notes (Signed)
@Patient  ID: Jeffery Copeland, male    DOB: 06-08-1946, 71 y.o.   MRN: [id number]  Chief Complaint  Patient presents with  . 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 Moderate centrilobular emphysema. Left lower lobectomy noted. Heart mildly enlarged w/ prosthetic mitral valve. No pathologic mediastinal lymph nodes.    11/27/2016 Follow up; COPD /Lung nodule  Patient returns for a follow-up. He was last seen January 2017. Patient has moderate COPD and is maintained on Spiriva daily. Patient had a PFT 11/06/2016 that showed an FEV1 at 61%, ratio 61, FVC 72%, no significant bronchodilator response, DLCO 35%. This is similar to 2014. 6 minute walk showed no desaturations. Total distance 45 m CT chest on February 2017 showed a stable 4 mm right middle lobe nodule that was unchanged since 2014 consistent with a benign etiology.. Patient says overall his breathing has been okay . Gets winded with some activities . Uses a walker . Says he is able to walk with walker , go fishing. Unable to do yard work.  He denies any flare cough or wheezing.     Allergies  Allergen Reactions  . Ace Inhibitors Swelling  . Rosuvastatin Other (See Comments)    Muscle aches    Immunization History  Administered Date(s) Administered  . Influenza Split 03/30/2012, 06/15/2015  . Influenza Whole 07/02/2011  . Influenza, High Dose Seasonal PF 04/29/2016  . Influenza-Unspecified 03/30/2014, 06/30/2015  . PPD Test 10/26/2015, 01/03/2016  . Td 12/27/2015  . Tdap 12/22/2012    Past Medical History:  Diagnosis Date  . Acute and subacute bacterial endocarditis 1999   group G  Streptococcus  . Atrial fibrillation ([place]) 11/11/2008   Qualifier: Diagnosis of  By: Lia Foyer, MD, Jaquelyn Bitter   . CARDIOMYOPATHY 10/20/2008   Qualifier: Diagnosis of  By: Owens Shark, RN, BSN, Lauren    . Cerebrovascular disease, unspecified   . Closed right acetabular fracture ([place])   . COPD (chronic obstructive pulmonary disease) ([place]) 1999  . Epidural hematoma ([place]) 03/01/2011   Recent fall 2012   . Esophageal reflux   . Generalized osteoarthrosis, unspecified site   . History of tobacco abuse   . Hypertension   . HYPERTENSION, BENIGN 10/20/2008   Qualifier: Diagnosis of  By: Owens Shark, RN, BSN, Lauren    . Hyponatremia   . Insomnia   . Iron deficiency anemia, unspecified   . ISCHEMIC COLITIS, HX OF 10/20/2008   Qualifier: Diagnosis of  By: Owens Shark, RN, BSN, Lauren    . Lung nodules 2008, 2014   LLL nodule removed 2008 with LLL lobectomy,  RLL nodule 84mm observation  . Prostate cancer ([place])   . S/P mitral valve replacement 02/01/1998   Carpentier-Edwards porcine bioprosthetic tissue valve, size 73mm Placed for acute and subacute bacterial endocarditis (group G Streptococcus) with pre-existing mitral valve prolapse   . S/P mitral valve replacement with bioprosthetic valve 03/24/2013   Redo mitral valve replacement using 29mm [facility] mitral bovine bioprosthetic tissue valve performed via right mini thoracotomy  . Seizures ([place])   . Severe mitral regurgitation 02/20/2013  . Unsteady gait     Tobacco History:  History  Smoking Status  . Former Smoker  . Packs/day: 2.00  . Years: 45.00  . Types: Cigarettes  . Quit date: 07/30/2006  Smokeless Tobacco  . Former Systems developer  . Quit date: 07/31/2007   Counseling given: Not Answered   Outpatient Encounter Prescriptions as of 11/27/2016  Medication Sig  . allopurinol (ZYLOPRIM) 100 MG tablet Take 100 mg by mouth daily.  . Cholecalciferol (VITAMIN D3) 1000 UNITS CAPS Take 1 capsule by mouth daily.   Marland Kitchen ezetimibe (ZETIA) 10 MG tablet Take 1 tablet  (10 mg total) by mouth daily.  Marland Kitchen levETIRAcetam (KEPPRA) 500 MG tablet Take 1 tablet (500 mg total) by mouth 2 (two) times daily.  . metoprolol tartrate (LOPRESSOR) 25 MG tablet TAKE 1/2 TABLET BY MOUTH TWICE A DAY (Patient taking differently: TAKE 112.5mg   BY MOUTH TWICE A DAY)  . oxyCODONE (OXY IR/ROXICODONE) 5 MG immediate release tablet 1 tablet by mouth every 6 hours as needed for moderate to severe pain  . phenytoin (DILANTIN) 100 MG ER capsule TAKE 3 CAPSULES  (300MG ) AT BEDTIME  . SPIRIVA HANDIHALER 18 MCG inhalation capsule INHALE THE CONTENTS OF 1 CAPSULE EVERY DAY VIA HANDIHALER  . temazepam (RESTORIL) 15 MG capsule Take 15 mg by mouth at bedtime as needed. sleep  . tiZANidine (ZANAFLEX) 4 MG tablet Take 4 mg by mouth every 6 (six) hours as needed for muscle spasms.  Marland Kitchen warfarin (COUMADIN) 6 MG tablet TAKE AS DIRECTED BY COUMADIN CLINIC  . zolpidem (AMBIEN) 5 MG tablet Take 1 tablet (5 mg total) by mouth at bedtime as needed for sleep. For insomnia.  . [DISCONTINUED] methocarbamol (ROBAXIN) 500 MG tablet Take 500 mg by mouth every 8 (eight) hours as needed for muscle spasms.    No facility-administered encounter medications on file as of 11/27/2016.      Review of Systems  Constitutional:   No  weight loss, night sweats,  Fevers, chills,  +fatigue, or  lassitude.  HEENT:   No headaches,  Difficulty swallowing,  Tooth/dental problems, or  Sore throat,                No sneezing, itching, ear ache, nasal congestion, post nasal drip,   CV:  No chest pain,  Orthopnea, PND, swelling in lower extremities, anasarca, dizziness, palpitations, syncope.   GI  No heartburn, indigestion, abdominal pain, nausea, vomiting, diarrhea, change in bowel habits, loss of appetite, bloody stools.   Resp: No shortness of breath with exertion or at rest.  No excess mucus, no productive cough,  No non-productive cough,  No coughing up of blood.  No change in color of mucus.  No wheezing.  No chest wall  deformity  Skin: no rash or lesions.  GU: no dysuria, change in color of urine, no urgency or frequency.  No flank pain, no hematuria   MS:  No joint pain or swelling.  No decreased range of motion.  No back pain.    Physical Exam  BP 132/86 (BP Location: Left Arm, Cuff Size: Normal)   Pulse 82   SpO2 96%   GEN: A/Ox3; pleasant , NAD, elderly , walks with walker    HEENT:  [place]/AT,  EACs-clear, TMs-wnl, NOSE-clear, THROAT-clear, no lesions, no postnasal drip or exudate noted.   NECK:  Supple w/ fair ROM; no JVD; normal carotid impulses w/o bruits; no thyromegaly or nodules palpated; no lymphadenopathy.    RESP  Decreased BS in bases l . no accessory muscle use, no dullness to percussion  CARD:  RRR, no m/r/g, no peripheral edema, pulses intact, no cyanosis or clubbing.  GI:   Soft & nt; nml bowel sounds; no organomegaly or masses detected.   Musco: Warm bil, no deformities or joint swelling noted. Walks with walker   Neuro: alert, no focal deficits noted.    Skin: Warm, no lesions or rashes    Lab Results:    Imaging: No results found.   Assessment & Plan:   COPD with emphysema ([place]) Compensated on present regimen - no significant decline in FEV1 over last 4 years .  Plan  Patient Instructions  Continue on Spiriva daily Follow up with Dr. Ashok Cordia in 1 year and .As needed          Rexene Edison, NP 11/27/2016

## 2016-11-27 NOTE — Progress Notes (Signed)
Note reviewed.  Sonia Baller Ashok Cordia, M.D. [facility] Pager:  [phone] After 3pm or if no response, call [phone] 11:07 PM 11/27/16

## 2016-11-27 NOTE — Assessment & Plan Note (Signed)
Compensated on present regimen - no significant decline in FEV1 over last 4 years .  Plan  Patient Instructions  Continue on Spiriva daily Follow up with Dr. Ashok Cordia in 1 year and .As needed

## 2016-11-27 NOTE — Patient Instructions (Addendum)
Continue on Spiriva daily Follow up with Dr. Ashok Cordia in 1 year and .As needed

## 2016-11-28 DIAGNOSIS — R2231 Localized swelling, mass and lump, right upper limb: Secondary | ICD-10-CM | POA: Diagnosis not present

## 2016-12-11 ENCOUNTER — Ambulatory Visit (INDEPENDENT_AMBULATORY_CARE_PROVIDER_SITE_OTHER): Payer: Medicare HMO | Admitting: *Deleted

## 2016-12-11 DIAGNOSIS — I4891 Unspecified atrial fibrillation: Secondary | ICD-10-CM | POA: Diagnosis not present

## 2016-12-11 DIAGNOSIS — Z5181 Encounter for therapeutic drug level monitoring: Secondary | ICD-10-CM

## 2016-12-11 LAB — POCT INR: INR: 2.7

## 2016-12-28 DIAGNOSIS — M6281 Muscle weakness (generalized): Secondary | ICD-10-CM | POA: Diagnosis not present

## 2016-12-28 DIAGNOSIS — S32401A Unspecified fracture of right acetabulum, initial encounter for closed fracture: Secondary | ICD-10-CM | POA: Diagnosis not present

## 2016-12-28 DIAGNOSIS — I4891 Unspecified atrial fibrillation: Secondary | ICD-10-CM | POA: Diagnosis not present

## 2016-12-28 DIAGNOSIS — R262 Difficulty in walking, not elsewhere classified: Secondary | ICD-10-CM | POA: Diagnosis not present

## 2017-01-08 DIAGNOSIS — Z6821 Body mass index (BMI) 21.0-21.9, adult: Secondary | ICD-10-CM | POA: Diagnosis not present

## 2017-01-08 DIAGNOSIS — M150 Primary generalized (osteo)arthritis: Secondary | ICD-10-CM | POA: Diagnosis not present

## 2017-01-08 DIAGNOSIS — M5136 Other intervertebral disc degeneration, lumbar region: Secondary | ICD-10-CM | POA: Diagnosis not present

## 2017-01-08 DIAGNOSIS — M1A09X1 Idiopathic chronic gout, multiple sites, with tophus (tophi): Secondary | ICD-10-CM | POA: Diagnosis not present

## 2017-01-09 ENCOUNTER — Ambulatory Visit (INDEPENDENT_AMBULATORY_CARE_PROVIDER_SITE_OTHER): Payer: Medicare HMO | Admitting: *Deleted

## 2017-01-09 ENCOUNTER — Encounter (INDEPENDENT_AMBULATORY_CARE_PROVIDER_SITE_OTHER): Payer: Self-pay

## 2017-01-09 DIAGNOSIS — I4891 Unspecified atrial fibrillation: Secondary | ICD-10-CM

## 2017-01-09 DIAGNOSIS — Z5181 Encounter for therapeutic drug level monitoring: Secondary | ICD-10-CM

## 2017-01-09 LAB — POCT INR: INR: 4.7

## 2017-01-24 ENCOUNTER — Ambulatory Visit (INDEPENDENT_AMBULATORY_CARE_PROVIDER_SITE_OTHER): Payer: Medicare HMO

## 2017-01-24 DIAGNOSIS — Z5181 Encounter for therapeutic drug level monitoring: Secondary | ICD-10-CM | POA: Diagnosis not present

## 2017-01-24 DIAGNOSIS — I4891 Unspecified atrial fibrillation: Secondary | ICD-10-CM | POA: Diagnosis not present

## 2017-01-24 LAB — POCT INR: INR: 1.7

## 2017-02-06 ENCOUNTER — Ambulatory Visit: Payer: Medicare HMO | Admitting: Adult Health

## 2017-02-07 ENCOUNTER — Ambulatory Visit (INDEPENDENT_AMBULATORY_CARE_PROVIDER_SITE_OTHER): Payer: Medicare HMO | Admitting: Adult Health

## 2017-02-07 ENCOUNTER — Encounter: Payer: Self-pay | Admitting: Adult Health

## 2017-02-07 ENCOUNTER — Other Ambulatory Visit: Payer: Self-pay | Admitting: Adult Health

## 2017-02-07 ENCOUNTER — Ambulatory Visit (INDEPENDENT_AMBULATORY_CARE_PROVIDER_SITE_OTHER): Payer: Medicare HMO | Admitting: *Deleted

## 2017-02-07 VITALS — BP 120/70 | HR 60 | Temp 98.7°F | Ht 69.0 in | Wt 140.6 lb

## 2017-02-07 DIAGNOSIS — G8929 Other chronic pain: Secondary | ICD-10-CM

## 2017-02-07 DIAGNOSIS — Z7689 Persons encountering health services in other specified circumstances: Secondary | ICD-10-CM

## 2017-02-07 DIAGNOSIS — I4891 Unspecified atrial fibrillation: Secondary | ICD-10-CM | POA: Diagnosis not present

## 2017-02-07 DIAGNOSIS — M545 Low back pain, unspecified: Secondary | ICD-10-CM

## 2017-02-07 DIAGNOSIS — Z760 Encounter for issue of repeat prescription: Secondary | ICD-10-CM

## 2017-02-07 DIAGNOSIS — Z5181 Encounter for therapeutic drug level monitoring: Secondary | ICD-10-CM

## 2017-02-07 LAB — POCT INR: INR: 2.4

## 2017-02-07 MED ORDER — TRAMADOL HCL 50 MG PO TABS: 50.0000 mg | ORAL_TABLET | Freq: Three times a day (TID) | ORAL | 0 refills | Status: DC | PRN

## 2017-02-07 MED ORDER — EZETIMIBE 10 MG PO TABS: 10.0000 mg | ORAL_TABLET | Freq: Every day | ORAL | 3 refills | Status: DC

## 2017-02-07 MED ORDER — TIZANIDINE HCL 4 MG PO TABS: 4.0000 mg | ORAL_TABLET | Freq: Four times a day (QID) | ORAL | 1 refills | Status: DC | PRN

## 2017-02-07 NOTE — Progress Notes (Signed)
Patient presents to clinic today to establish care. He is a pleasant 71 year old male who  has a past medical history of Acute and subacute bacterial endocarditis (1999); Atrial fibrillation ([place]) (11/11/2008); CARDIOMYOPATHY (10/20/2008); Cerebrovascular disease, unspecified; Closed right acetabular fracture ([place]); COPD (chronic obstructive pulmonary disease) ([place]) (1999); Epidural hematoma ([place]) (03/01/2011); Esophageal reflux; Generalized osteoarthrosis, unspecified site; History of tobacco abuse; Hypertension; HYPERTENSION, BENIGN (10/20/2008); Hyponatremia; Insomnia; Iron deficiency anemia, unspecified; ISCHEMIC COLITIS, HX OF (10/20/2008); Lung nodules (2008, 2014); Prostate cancer ([place]); S/P mitral valve replacement (02/01/1998); S/P mitral valve replacement with bioprosthetic valve (03/24/2013); Seizures ([place]); Severe mitral regurgitation (02/20/2013); and Unsteady gait.  He is a former patient of Eloise Levels, NP at [facility]   Acute Concerns: Establish Care   Medication Refill   Chronic Issues: COPD with emphysema and lung nodule - is followed by pulmonary   Seizure disorder - managed with Keppra 500mg  BID and Dilantin 300mg  daily. Is seen by Dr. Leonie Man with neurology   A fib/Mitral Valve Replacement  - Managed with coumadin therapy. He sees Dr. [name] with Cardiology   Hyperlipidemia - Managed with zetia.  Lab Results  Component Value Date   CHOL 133 08/12/2015   HDL 51 08/12/2015   LDLCALC 58 08/12/2015   TRIG 122 08/12/2015   CHOLHDL 2.6 08/12/2015      Health Maintenance: Dental -- Does not do routine care Vision -- Does not do routine care Immunizations -- Questionable if he needs pneumonia vaccinations. Will get records from [place]  Colonoscopy -- He is due in September 2018   Is followed by   Urology - due to hx of prostate cancer  Cardiology - Dr. [name]  Pulmonary - Dr. Blanchard Mane and Rexene Edison NP  Neurology - [facility]   Past  Medical History:  Diagnosis Date  . Acute and subacute bacterial endocarditis 1999   group G Streptococcus  . Atrial fibrillation ([place]) 11/11/2008   Qualifier: Diagnosis of  By: Lia Foyer, MD, Jaquelyn Bitter   . CARDIOMYOPATHY 10/20/2008   Qualifier: Diagnosis of  By: Owens Shark, RN, BSN, Lauren    . Cerebrovascular disease, unspecified   . Closed right acetabular fracture ([place])   . COPD (chronic obstructive pulmonary disease) ([place]) 1999  . Epidural hematoma ([place]) 03/01/2011   Recent fall 2012   . Esophageal reflux   . Generalized osteoarthrosis, unspecified site   . History of tobacco abuse   . Hypertension   . HYPERTENSION, BENIGN 10/20/2008   Qualifier: Diagnosis of  By: Owens Shark, RN, BSN, Lauren    . Hyponatremia   . Insomnia   . Iron deficiency anemia, unspecified   . ISCHEMIC COLITIS, HX OF 10/20/2008   Qualifier: Diagnosis of  By: Owens Shark, RN, BSN, Lauren    . Lung nodules 2008, 2014   LLL nodule removed 2008 with LLL lobectomy,  RLL nodule 40mm observation  . Prostate cancer ([place])   . S/P mitral valve replacement 02/01/1998   Carpentier-Edwards porcine bioprosthetic tissue valve, size 31mm Placed for acute and subacute bacterial endocarditis (group G Streptococcus) with pre-existing mitral valve prolapse   . S/P mitral valve replacement with bioprosthetic valve 03/24/2013   Redo mitral valve replacement using 67mm [facility] mitral bovine bioprosthetic tissue valve performed via right mini thoracotomy  . Seizures ([place])   . Severe mitral regurgitation 02/20/2013  . Unsteady gait     Past Surgical History:  Procedure Laterality Date  . CARDIAC VALVE REPLACEMENT    . INTRAOPERATIVE TRANSESOPHAGEAL  ECHOCARDIOGRAM N/A 03/24/2013   Procedure: INTRAOPERATIVE TRANSESOPHAGEAL ECHOCARDIOGRAM;  Surgeon: Rexene Alberts, MD;  Location: [place];  Service: Open Heart Surgery;  Laterality: N/A;  . MINIMALLY INVASIVE MAZE PROCEDURE N/A 03/24/2013   Procedure: MINIMALLY INVASIVE MAZE PROCEDURE;   Surgeon: Rexene Alberts, MD;  Location: [place];  Service: Open Heart Surgery;  Laterality: N/A;  . MITRAL VALVE REPLACEMENT  02/01/1998   Carpentier-Edwards porcine bioprosthetic tissue valve, size 25mm, placed for complicated bacterial endocarditis  . MITRAL VALVE REPLACEMENT Right 03/24/2013   Procedure: MINIMALLY INVASIVE REDO MITRAL VALVE (MV) REPLACEMENT;  Surgeon: Rexene Alberts, MD;  Location: [place];  Service: Open Heart Surgery;  Laterality: Right;  . PROSTATE BIOPSY    . TEE WITHOUT CARDIOVERSION N/A 02/12/2013   Procedure: TRANSESOPHAGEAL ECHOCARDIOGRAM (TEE);  Surgeon: Fay Records, MD;  Location: [place];  Service: Cardiovascular;  Laterality: N/A;  . VIDEO ASSISTED THORACOSCOPY  04/29/2007   Left VATS w/ mini thoracotomy for Left Lower Lobectomy for benign lung nodules    Current Outpatient Prescriptions on File Prior to Visit  Medication Sig Dispense Refill  . allopurinol (ZYLOPRIM) 300 MG tablet Take 150 mg by mouth daily.    . Cholecalciferol (VITAMIN D3) 1000 UNITS CAPS Take 1 capsule by mouth daily.     Marland Kitchen ezetimibe (ZETIA) 10 MG tablet Take 1 tablet (10 mg total) by mouth daily. 30 tablet 6  . levETIRAcetam (KEPPRA) 500 MG tablet Take 1 tablet (500 mg total) by mouth 2 (two) times daily. 180 tablet 2  . metoprolol tartrate (LOPRESSOR) 25 MG tablet TAKE 1/2 TABLET BY MOUTH TWICE A DAY (Patient taking differently: TAKE 112.5mg   BY MOUTH TWICE A DAY) 30 tablet 4  . oxyCODONE (OXY IR/ROXICODONE) 5 MG immediate release tablet 1 tablet by mouth every 6 hours as needed for moderate to severe pain 80 tablet 0  . phenytoin (DILANTIN) 100 MG ER capsule TAKE 3 CAPSULES  (300MG ) AT BEDTIME 270 capsule 3  . SPIRIVA HANDIHALER 18 MCG inhalation capsule INHALE THE CONTENTS OF 1 CAPSULE EVERY DAY VIA HANDIHALER 90 capsule 0  . temazepam (RESTORIL) 15 MG capsule Take 15 mg by mouth at bedtime as needed. sleep  3  . tiZANidine (ZANAFLEX) 4 MG tablet Take 4 mg by mouth every 6 (six) hours  as needed for muscle spasms.    Marland Kitchen warfarin (COUMADIN) 6 MG tablet TAKE AS DIRECTED BY COUMADIN CLINIC 100 tablet 0   No current facility-administered medications on file prior to visit.     Allergies  Allergen Reactions  . Ace Inhibitors Swelling  . Rosuvastatin Other (See Comments)    Muscle aches    Family History  Problem Relation Age of Onset  . Cancer Father        prostate cancer  . Tuberculosis Father   . Heart attack Father   . Cancer Brother        prostate cancer  . Lung disease Neg Hx     Social History   Social History  . Marital status: Divorced    Spouse name: N/A  . Number of children: 2  . Years of education: 9   Occupational History  . disabled    Social History Main Topics  . Smoking status: Former Smoker    Packs/day: 2.00    Years: 45.00    Types: Cigarettes    Quit date: 07/30/2006  . Smokeless tobacco: Former Systems developer    Quit date: 07/31/2007  . Alcohol use No  Comment: previous alcohol use & home-made alcohol consumption  . Drug use: No     Comment: previous marijuana  . Sexual activity: Not Currently   Other Topics Concern  . Not on file   Social History Narrative   Patient is divorced and has 2 children.   Patient is right handed.   Patient has 9 th grade education.    Patient drinks diet sodas.      [facility]:   From [place] originally. Always lived in [place]. 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 Previously traveled to [place], [place], & [place]. No pets currently. Remote pet owl and parakeet exposure. No mold exposure. Enjoys fishing.     Review of Systems  Constitutional: Negative.   HENT: Positive for hearing loss.   Eyes: Negative.   Respiratory: Negative.   Cardiovascular: Negative.   Gastrointestinal: Negative.   Genitourinary: Negative.   Musculoskeletal: Positive for joint pain.  Skin: Negative.   Neurological: Negative.   Endo/Heme/Allergies: Negative.   Psychiatric/Behavioral: Negative.     All other systems reviewed and are negative.   BP 120/70   Pulse 60   Temp 98.7 F (37.1 C) (Oral)   Ht 5\' 9"  (1.753 m)   Wt 140 lb 9.6 oz (63.8 kg)   BMI 20.76 kg/m   Physical Exam  Constitutional: He is oriented to person, place, and time and well-developed, well-nourished, and in no distress. No distress.  Eyes: Pupils are equal, round, and reactive to light. Conjunctivae and EOM are normal. Right eye exhibits no discharge. Left eye exhibits no discharge. No scleral icterus.  Cardiovascular: Normal rate, normal heart sounds and intact distal pulses.  An irregularly irregular rhythm present. Exam reveals no gallop and no friction rub.   No murmur heard. Pulmonary/Chest: Effort normal and breath sounds normal. No respiratory distress. He has no wheezes. He has no rales. He exhibits no tenderness.  Musculoskeletal:  Uses a rolling walker. Has steady but slow gait   Neurological: He is alert and oriented to person, place, and time. Gait normal. GCS score is 15.  Skin: Skin is warm and dry. No rash noted. He is not diaphoretic. No erythema. No pallor.  Psychiatric: Mood, memory, affect and judgment normal.  Nursing note and vitals reviewed.   Recent Results (from the past 2160 hour(s))  POCT INR     Status: None   Collection Time: 11/13/16 11:33 AM  Result Value Ref Range   INR 3.8   POCT INR     Status: None   Collection Time: 12/11/16  9:30 AM  Result Value Ref Range   INR 2.7   POCT INR     Status: None   Collection Time: 01/09/17 11:15 AM  Result Value Ref Range   INR 4.7   POCT INR     Status: None   Collection Time: 01/24/17 11:22 AM  Result Value Ref Range   INR 1.7   POCT INR     Status: None   Collection Time: 02/07/17 11:12 AM  Result Value Ref Range   INR 2.4     Assessment/Plan: 1. Encounter to establish care - Will get records from [place]  - Follow up in one month for CPE or sooner if needed  2. Medication refill - ezetimibe (ZETIA) 10 MG tablet;  Take 1 tablet (10 mg total) by mouth daily.  Dispense: 90 tablet; Refill: 3 - tiZANidine (ZANAFLEX) 4 MG tablet; Take 1 tablet (4 mg total) by mouth every  6 (six) hours as needed for muscle spasms.  Dispense: 60 tablet; Refill: 1 - traMADol (ULTRAM) 50 MG tablet; Take 1 tablet (50 mg total) by mouth every 8 (eight) hours as needed.  Dispense: 90 tablet; Refill: 0  3. Chronic midline low back pain without sciatica - d/c Oxycodone and trial Tramadol  - traMADol (ULTRAM) 50 MG tablet; Take 1 tablet (50 mg total) by mouth every 8 (eight) hours as needed.  Dispense: 90 tablet; Refill: 0   Dorothyann Peng, NP

## 2017-02-07 NOTE — Patient Instructions (Signed)
It was great meeting you today   I am going to request your records from [facility]   Please follow up with me in one month for your physical   I have replaced Oxycodone with a pain medication called Tramadol

## 2017-02-12 NOTE — Telephone Encounter (Signed)
Last OV: 02/07/17 Last refill: 02/07/17 #60 with 1 refill.  OK to refill? Please advise.

## 2017-02-12 NOTE — Telephone Encounter (Signed)
Ok to refill 

## 2017-02-22 ENCOUNTER — Ambulatory Visit (INDEPENDENT_AMBULATORY_CARE_PROVIDER_SITE_OTHER): Payer: Medicare HMO | Admitting: *Deleted

## 2017-02-22 DIAGNOSIS — Z5181 Encounter for therapeutic drug level monitoring: Secondary | ICD-10-CM

## 2017-02-22 DIAGNOSIS — I4891 Unspecified atrial fibrillation: Secondary | ICD-10-CM

## 2017-02-22 LAB — POCT INR: INR: 3.8

## 2017-03-04 ENCOUNTER — Telehealth: Payer: Self-pay | Admitting: Adult Health

## 2017-03-04 NOTE — Telephone Encounter (Signed)
Pt need new Rx for SPIRIVA and metoprolol tartrate  Pharm:  [facility].

## 2017-03-05 MED ORDER — METOPROLOL TARTRATE 25 MG PO TABS: 12.5000 mg | ORAL_TABLET | Freq: Two times a day (BID) | ORAL | 3 refills | Status: DC

## 2017-03-05 MED ORDER — TIOTROPIUM BROMIDE MONOHYDRATE 18 MCG IN CAPS: ORAL_CAPSULE | RESPIRATORY_TRACT | 3 refills | Status: DC

## 2017-03-05 NOTE — Telephone Encounter (Signed)
Sent to the pharmacy for 1 year per [facility]

## 2017-03-05 NOTE — Telephone Encounter (Signed)
Ok to refill 

## 2017-03-07 ENCOUNTER — Ambulatory Visit (INDEPENDENT_AMBULATORY_CARE_PROVIDER_SITE_OTHER): Payer: Medicare HMO | Admitting: Pharmacist

## 2017-03-07 DIAGNOSIS — I4891 Unspecified atrial fibrillation: Secondary | ICD-10-CM

## 2017-03-07 DIAGNOSIS — Z5181 Encounter for therapeutic drug level monitoring: Secondary | ICD-10-CM

## 2017-03-07 LAB — PROTIME-INR
INR: 5 — AB (ref 0.8–1.2)
PROTHROMBIN TIME: 53 s — AB (ref 9.1–12.0)

## 2017-03-07 LAB — POCT INR: INR: 6.5

## 2017-03-07 IMAGING — CR DG CHEST 2V
3 series · 3 of 3 positions shown · non-contrast
Comparison: CT 09/09/2015.  Radiographs 04/06/2013.

CLINICAL DATA: Shortness of breath.  Patient fell 3 days ago.

EXAM:
CHEST  2 VIEW

[x chest ap]
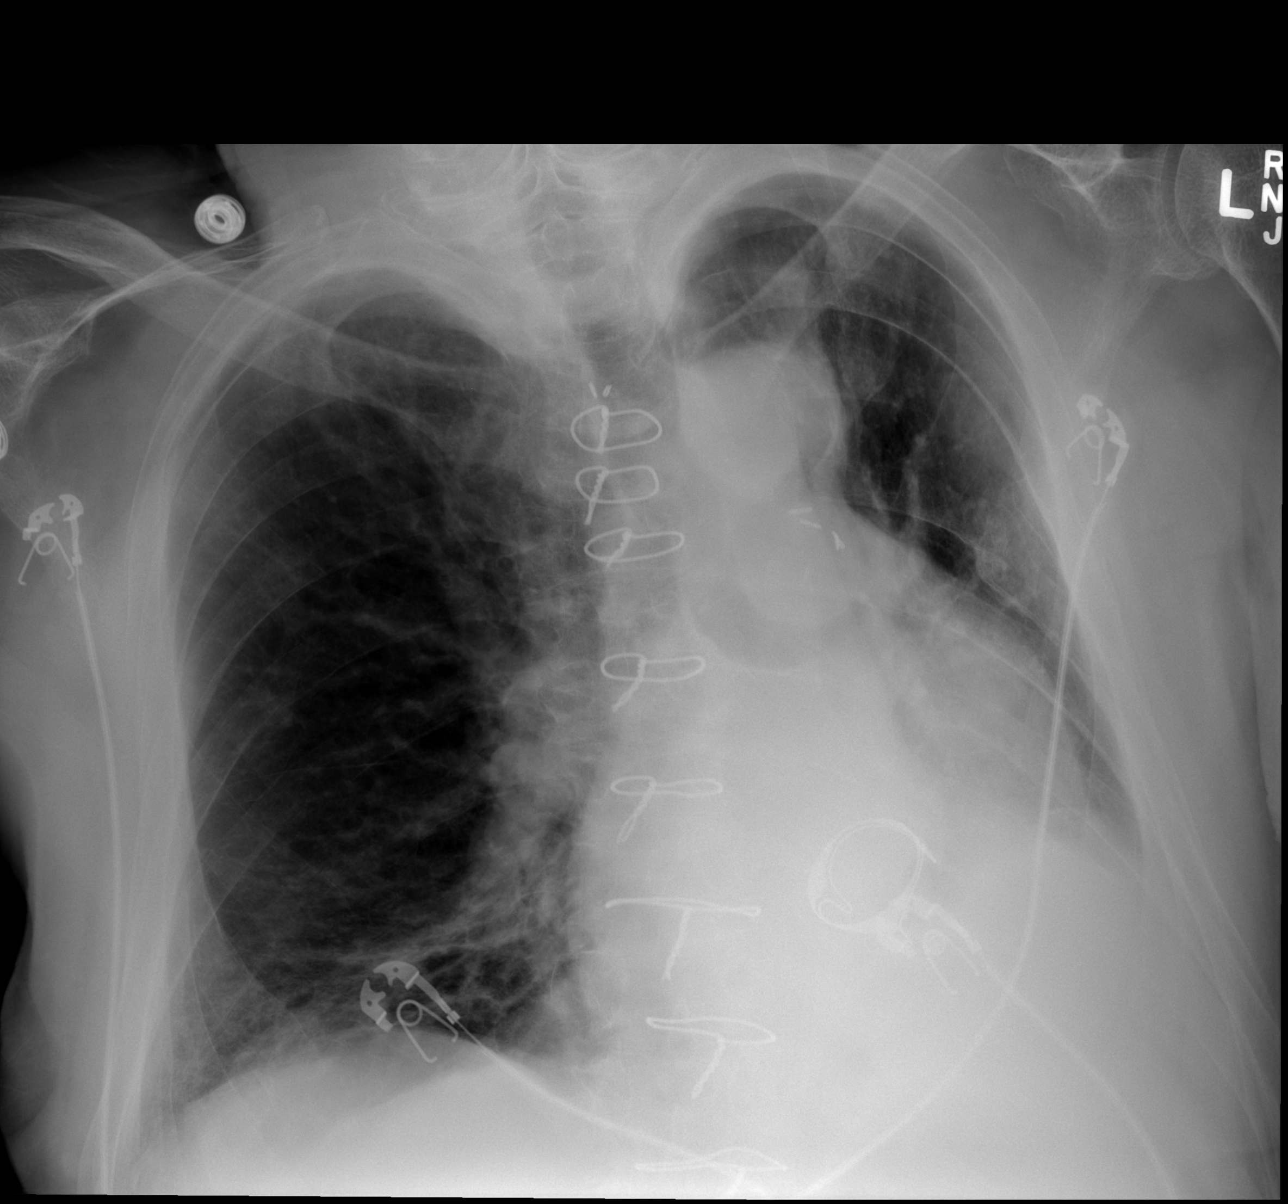

[w chest lat (1 of 2)]
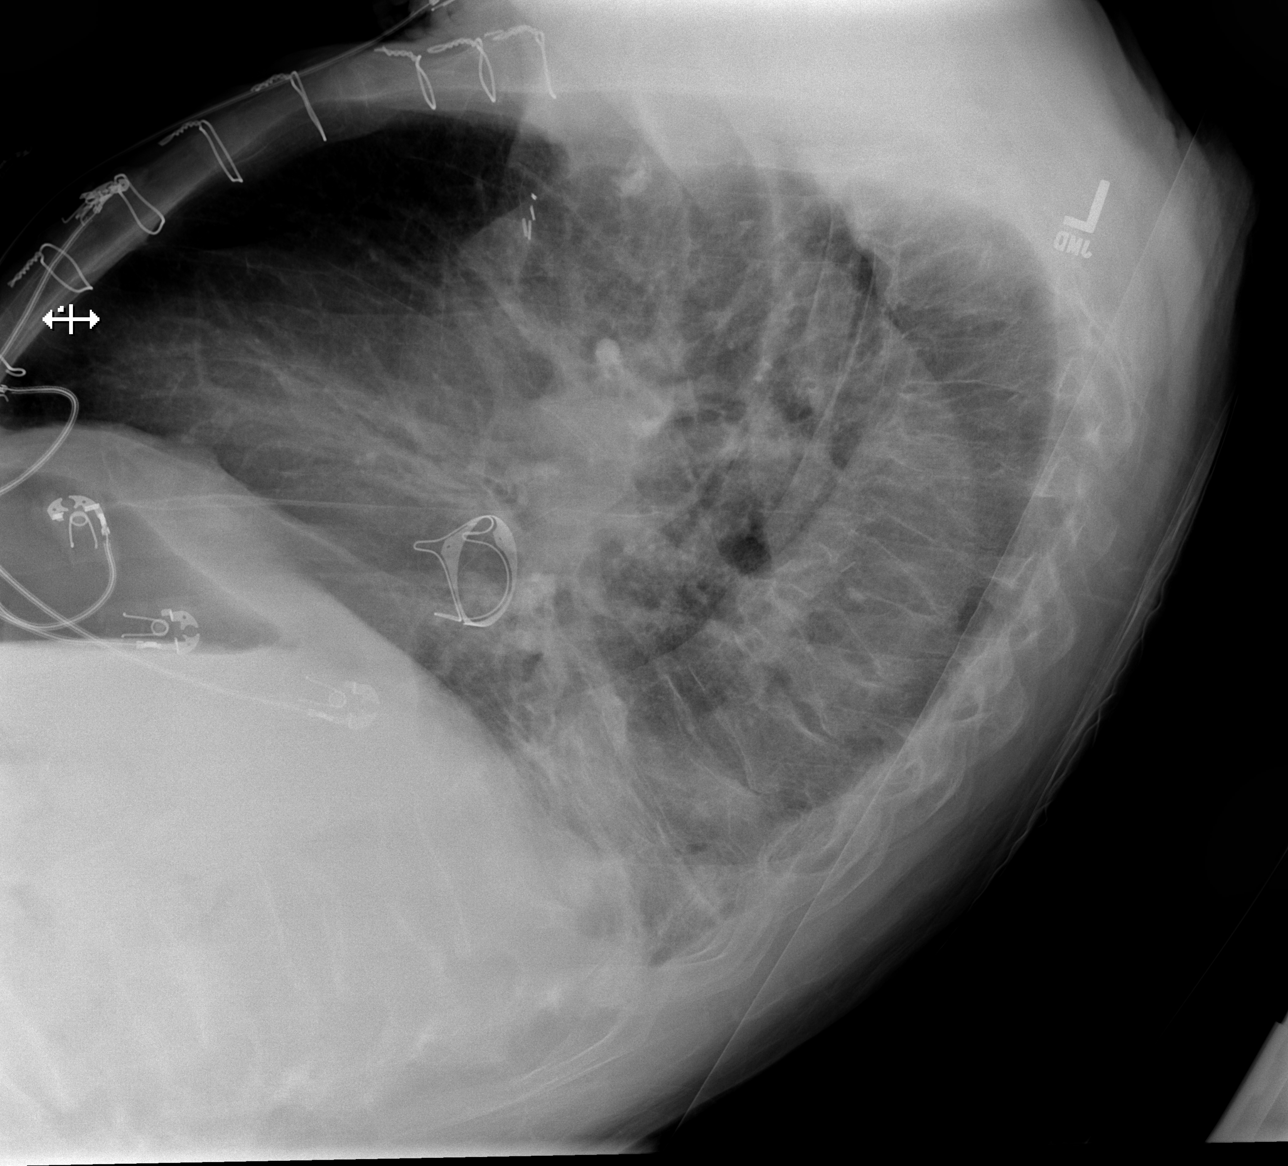

[w chest lat (2 of 2)]
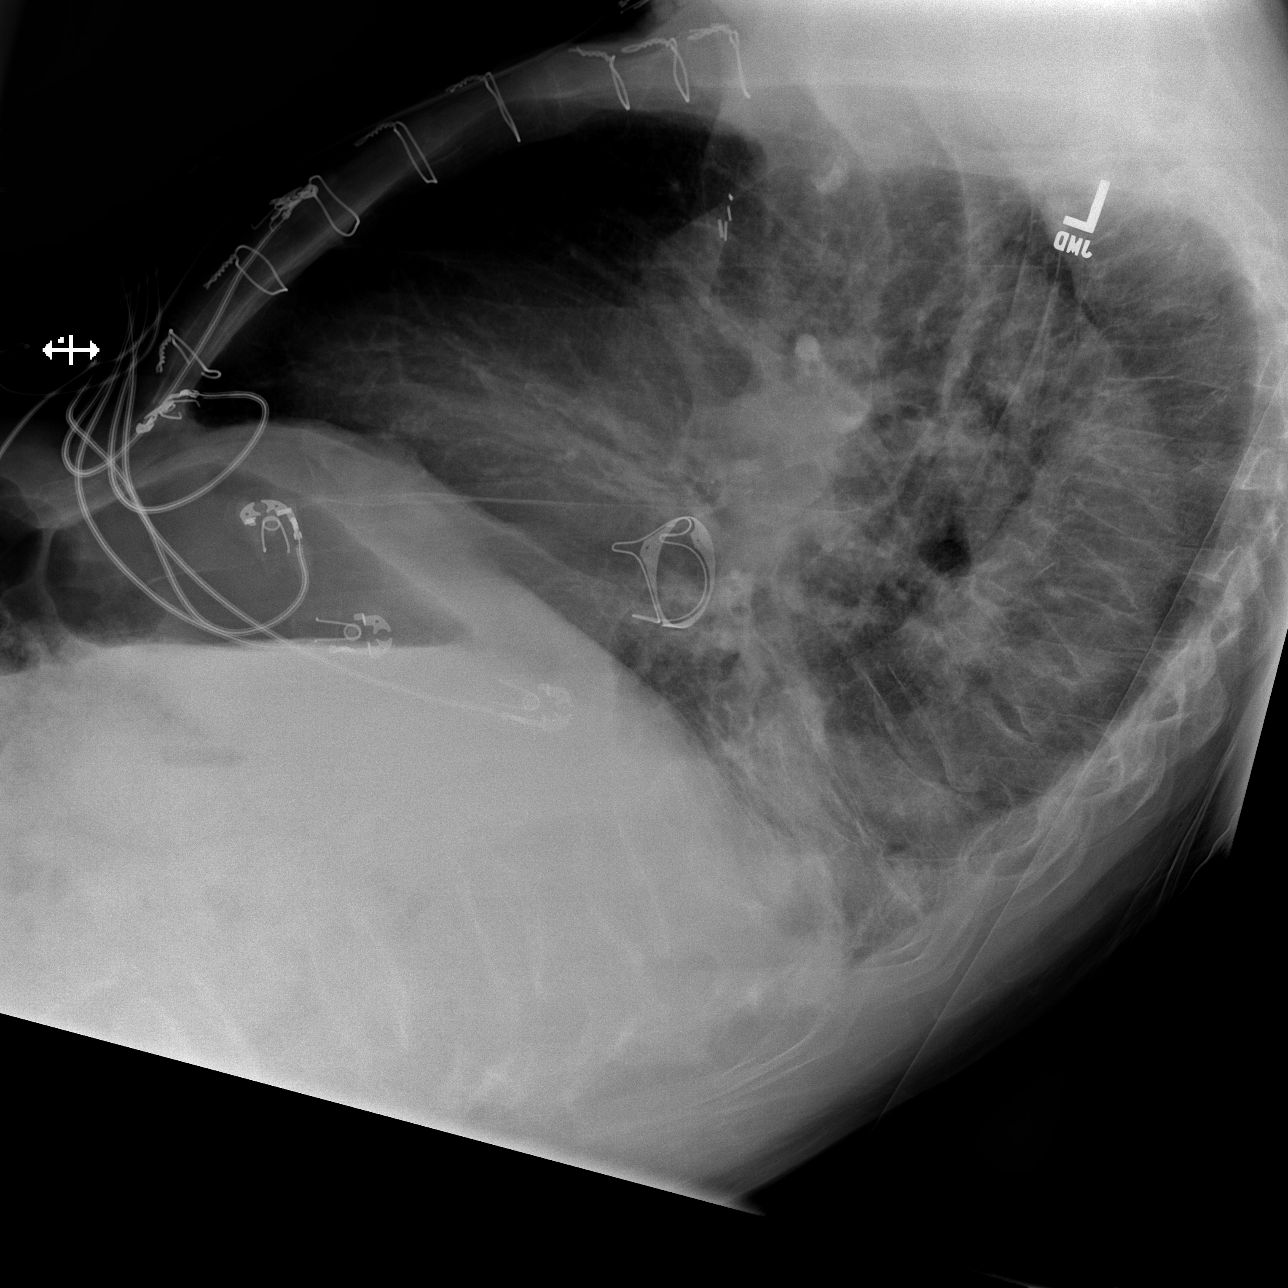

[3 of 3 positions shown; findings below may reference images not displayed]

FINDINGS: The heart size and mediastinal contours are stable status post
median sternotomy and mitral valve replacement. Chronic volume loss
and opacity inferiorly in the left hemithorax are grossly stable.
There is stable mild atelectasis at the right lung base. No
significant pleural effusion or pneumothorax identified. Numerous
thoracic compression deformities are grossly stable.
IMPRESSION: Stable postoperative chest with chronic findings in the left
hemithorax. No acute findings seen.

## 2017-03-07 IMAGING — CR DG HIP (WITH OR WITHOUT PELVIS) 2-3V*R*
5 series · 5 of 5 positions shown · non-contrast
Comparison: AP view of the abdomen of [DATE].

CLINICAL DATA: Status post fall [REDACTED] with persistent pain
involving the right hip with inability to bear weight.

EXAM:
DG HIP (WITH OR WITHOUT PELVIS) 2-3V RIGHT

[x pelvis (1 of 2)]
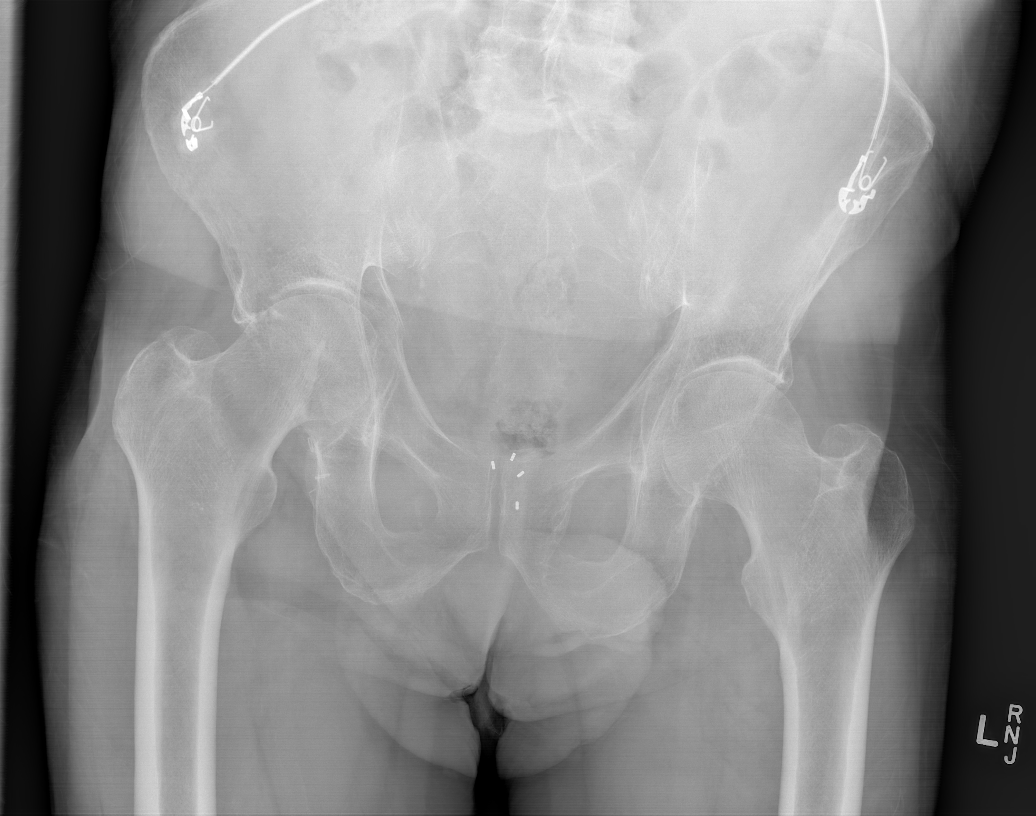

[x pelvis (2 of 2)]
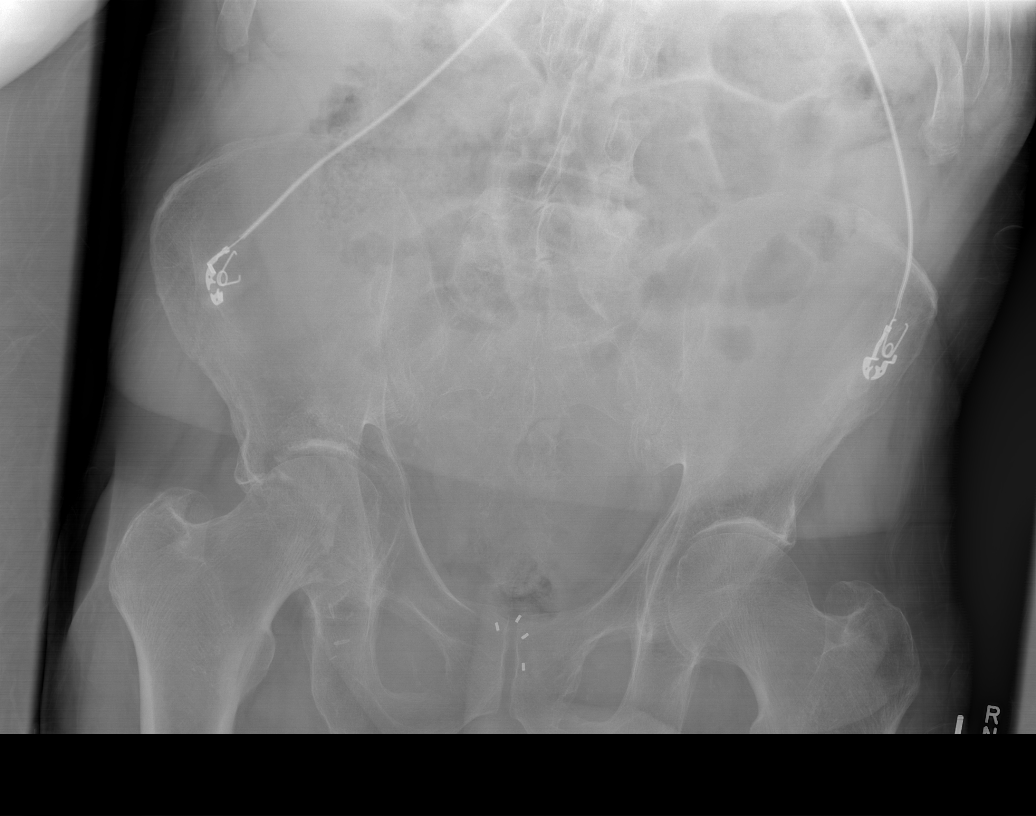

[x hip ap right]
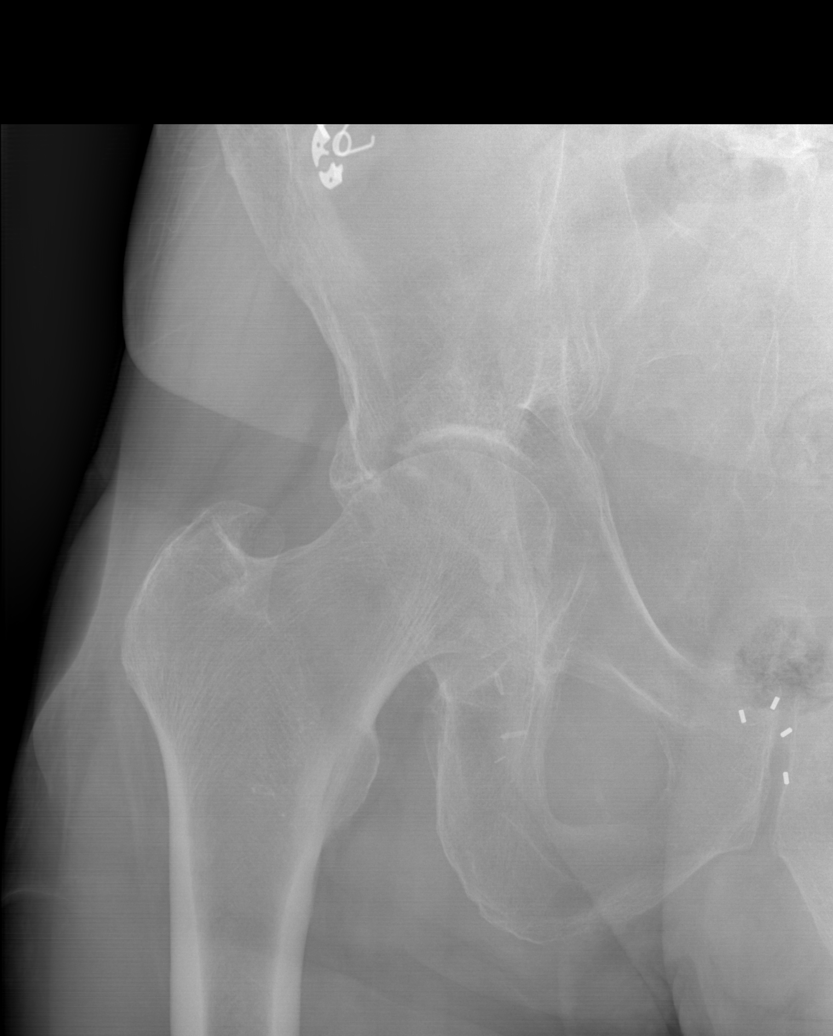

[w hip lat right (1 of 2)]
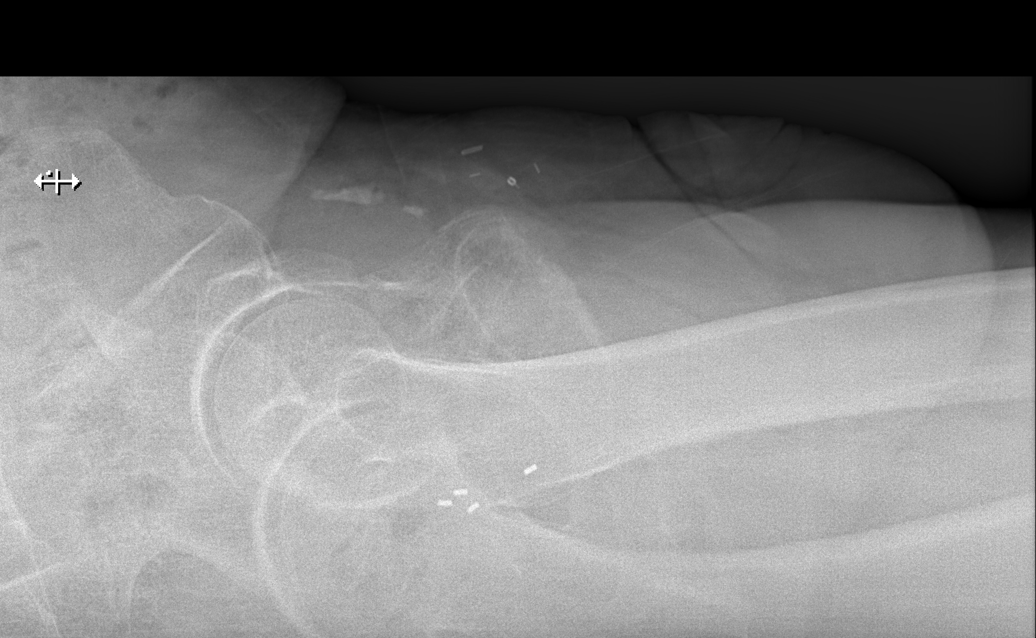

[w hip lat right (2 of 2)]
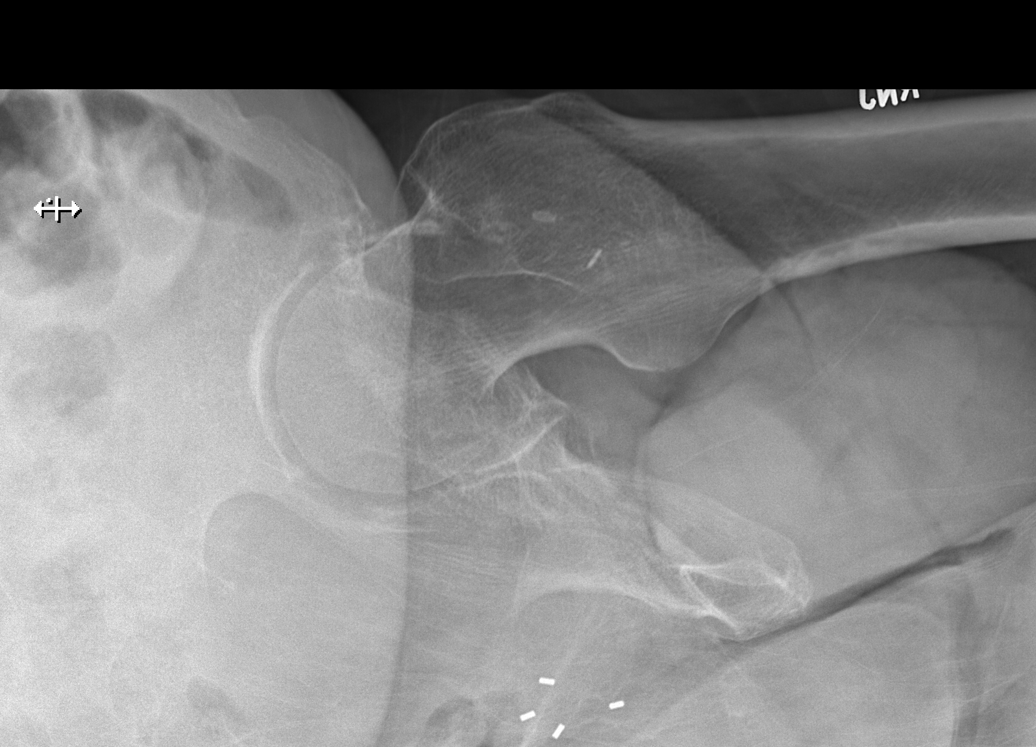

[5 of 5 positions shown; findings below may reference images not displayed]

FINDINGS: The bones are osteopenic. The iliac bone appears preserved. The
pubic rami are intact. There is subtle cortical irregularity on 1 of
the AP views involving the medial wall of the acetabulum. There is
mild narrowing of the right hip joint though this is less
conspicuous than the narrowing on the left. The articular surface of
the acetabulum and femoral head remains smoothly rounded. The
femoral neck and intertrochanteric regions are normal.
IMPRESSION: Possible minimally displaced fracture of the medial wall of the
acetabulum. No definite fracture observed elsewhere. There is
moderate degenerative joint space loss of the right hip.
Incidentally noted is moderate to severe joint space loss on the
left.

CT scanning of the right hip is recommended.

## 2017-03-15 ENCOUNTER — Encounter: Payer: Self-pay | Admitting: Adult Health

## 2017-03-15 ENCOUNTER — Ambulatory Visit (INDEPENDENT_AMBULATORY_CARE_PROVIDER_SITE_OTHER): Payer: Medicare HMO | Admitting: Adult Health

## 2017-03-15 VITALS — BP 138/70 | Temp 98.5°F | Ht 64.5 in | Wt 139.0 lb

## 2017-03-15 DIAGNOSIS — Z1159 Encounter for screening for other viral diseases: Secondary | ICD-10-CM

## 2017-03-15 DIAGNOSIS — Z0000 Encounter for general adult medical examination without abnormal findings: Secondary | ICD-10-CM | POA: Diagnosis not present

## 2017-03-15 DIAGNOSIS — I679 Cerebrovascular disease, unspecified: Secondary | ICD-10-CM | POA: Diagnosis not present

## 2017-03-15 DIAGNOSIS — I10 Essential (primary) hypertension: Secondary | ICD-10-CM

## 2017-03-15 DIAGNOSIS — Z760 Encounter for issue of repeat prescription: Secondary | ICD-10-CM | POA: Diagnosis not present

## 2017-03-15 DIAGNOSIS — G8929 Other chronic pain: Secondary | ICD-10-CM | POA: Diagnosis not present

## 2017-03-15 DIAGNOSIS — R569 Unspecified convulsions: Secondary | ICD-10-CM | POA: Diagnosis not present

## 2017-03-15 DIAGNOSIS — M545 Low back pain, unspecified: Secondary | ICD-10-CM

## 2017-03-15 DIAGNOSIS — C61 Malignant neoplasm of prostate: Secondary | ICD-10-CM

## 2017-03-15 LAB — HEPATIC FUNCTION PANEL
ALBUMIN: 3.9 g/dL (ref 3.5–5.2)
ALK PHOS: 98 U/L (ref 39–117)
ALT: 13 U/L (ref 0–53)
AST: 14 U/L (ref 0–37)
Bilirubin, Direct: 0.1 mg/dL (ref 0.0–0.3)
TOTAL PROTEIN: 6 g/dL (ref 6.0–8.3)
Total Bilirubin: 0.5 mg/dL (ref 0.2–1.2)

## 2017-03-15 LAB — CBC WITH DIFFERENTIAL/PLATELET
BASOS ABS: 0 10*3/uL (ref 0.0–0.1)
BASOS PCT: 0.9 % (ref 0.0–3.0)
Eosinophils Absolute: 0.1 10*3/uL (ref 0.0–0.7)
Eosinophils Relative: 2.4 % (ref 0.0–5.0)
HEMATOCRIT: 37.6 % — AB (ref 39.0–52.0)
Hemoglobin: 12.5 g/dL — ABNORMAL LOW (ref 13.0–17.0)
LYMPHS ABS: 0.7 10*3/uL (ref 0.7–4.0)
LYMPHS PCT: 13.3 % (ref 12.0–46.0)
MCHC: 33.1 g/dL (ref 30.0–36.0)
MCV: 100 fl (ref 78.0–100.0)
MONOS PCT: 15.2 % — AB (ref 3.0–12.0)
Monocytes Absolute: 0.8 10*3/uL (ref 0.1–1.0)
NEUTROS ABS: 3.7 10*3/uL (ref 1.4–7.7)
NEUTROS PCT: 68.2 % (ref 43.0–77.0)
PLATELETS: 178 10*3/uL (ref 150.0–400.0)
RBC: 3.76 Mil/uL — ABNORMAL LOW (ref 4.22–5.81)
RDW: 13.3 % (ref 11.5–15.5)
WBC: 5.3 10*3/uL (ref 4.0–10.5)

## 2017-03-15 LAB — BASIC METABOLIC PANEL
BUN: 8 mg/dL (ref 6–23)
CALCIUM: 8.9 mg/dL (ref 8.4–10.5)
CHLORIDE: 101 meq/L (ref 96–112)
CO2: 33 meq/L — AB (ref 19–32)
CREATININE: 0.96 mg/dL (ref 0.40–1.50)
GFR: 82.04 mL/min (ref >60.00)
GLUCOSE: 83 mg/dL (ref 70–99)
Potassium: 4.4 mEq/L (ref 3.5–5.1)
Sodium: 138 mEq/L (ref 135–145)

## 2017-03-15 LAB — LIPID PANEL
CHOL/HDL RATIO: 2
Cholesterol: 119 mg/dL (ref 0–200)
HDL: 48.9 mg/dL (ref >39.00)
LDL Cholesterol: 58 mg/dL (ref 0–99)
NONHDL: 69.85
TRIGLYCERIDES: 59 mg/dL (ref 0.0–149.0)
VLDL: 11.8 mg/dL (ref 0.0–40.0)

## 2017-03-15 LAB — TSH: TSH: 1.26 u[IU]/mL (ref 0.35–4.50)

## 2017-03-15 MED ORDER — TRAMADOL HCL 50 MG PO TABS: 50.0000 mg | ORAL_TABLET | Freq: Three times a day (TID) | ORAL | 0 refills | Status: DC | PRN

## 2017-03-15 NOTE — Patient Instructions (Signed)
It was great seeing you today   I will follow up with you about your blood work   Please follow up with me in 6 months to see how you are doing. Of course, if you need anything before that, please let me know

## 2017-03-15 NOTE — Progress Notes (Signed)
Subjective:    Patient ID: Jeffery Copeland, male    DOB: 08-07-1945, 71 y.o.   MRN: [id number]  HPI  Patient presents for yearly preventative medicine examination. He is a pleasant 71 year old male who  has a past medical history of Acute and subacute bacterial endocarditis (1999); Atrial fibrillation ([place]) (11/11/2008); CARDIOMYOPATHY (10/20/2008); Cerebrovascular disease, unspecified; Closed right acetabular fracture ([place]); COPD (chronic obstructive pulmonary disease) ([place]) (1999); Epidural hematoma ([place]) (03/01/2011); Esophageal reflux; Generalized osteoarthrosis, unspecified site; History of tobacco abuse; Hypertension; HYPERTENSION, BENIGN (10/20/2008); Hyponatremia; Insomnia; Iron deficiency anemia, unspecified; ISCHEMIC COLITIS, HX OF (10/20/2008); Lung nodules (2008, 2014); Prostate cancer ([place]); S/P mitral valve replacement (02/01/1998); S/P mitral valve replacement with bioprosthetic valve (03/24/2013); Seizures ([place]); Severe mitral regurgitation (02/20/2013); and Unsteady gait.  All immunizations and health maintenance protocols were reviewed with the patient and needed orders were placed.  Appropriate screening laboratory values were ordered for the patient including screening of hyperlipidemia, renal function and hepatic function.  Medication reconciliation,  past medical history, social history, problem list and allergies were reviewed in detail with the patient  Goals were established with regard to weight loss, exercise, and  diet in compliance with medications  End of life planning was discussed.   He does not participate in routine dental or vision care. He is due next month for his colonoscopy - He reports that he has made an appointment with Dr. Collene Mares for next month.   COPD with emphysema and lung nodule - is followed by pulmonary - stable   Seizure disorder - managed with Keppra 500mg  BID and Dilantin 300mg  daily. Is seen by Dr. Leonie Man with neurology - stable   A fib/Mitral Valve  Replacement  - Managed with coumadin therapy. He sees Dr. [name] with Cardiology - stable   Hyperlipidemia - Managed with zetia.   Review of Systems  Constitutional: Negative.   HENT: Positive for hearing loss.   Eyes: Negative.   Respiratory: Negative.   Cardiovascular: Negative.   Gastrointestinal: Negative.   Endocrine: Negative.   Genitourinary: Negative.   Musculoskeletal: Positive for arthralgias and gait problem.  Skin: Negative.   Allergic/Immunologic: Negative.   Hematological: Negative.   Psychiatric/Behavioral: Negative.   All other systems reviewed and are negative.  Past Medical History:  Diagnosis Date  . Acute and subacute bacterial endocarditis 1999   group G Streptococcus  . Atrial fibrillation ([place]) 11/11/2008   Qualifier: Diagnosis of  By: Lia Foyer, MD, Jaquelyn Bitter   . CARDIOMYOPATHY 10/20/2008   Qualifier: Diagnosis of  By: Owens Shark, RN, BSN, Lauren    . Cerebrovascular disease, unspecified   . Closed right acetabular fracture ([place])   . COPD (chronic obstructive pulmonary disease) ([place]) 1999  . Epidural hematoma ([place]) 03/01/2011   Recent fall 2012   . Esophageal reflux   . Generalized osteoarthrosis, unspecified site   . History of tobacco abuse   . Hypertension   . HYPERTENSION, BENIGN 10/20/2008   Qualifier: Diagnosis of  By: Owens Shark, RN, BSN, Lauren    . Hyponatremia   . Insomnia   . Iron deficiency anemia, unspecified   . ISCHEMIC COLITIS, HX OF 10/20/2008   Qualifier: Diagnosis of  By: Owens Shark, RN, BSN, Lauren    . Lung nodules 2008, 2014   LLL nodule removed 2008 with LLL lobectomy,  RLL nodule 44mm observation  . Prostate cancer ([place])   . S/P mitral valve replacement 02/01/1998   Carpentier-Edwards porcine bioprosthetic tissue valve, size 54mm Placed for acute and subacute  bacterial endocarditis (group G Streptococcus) with pre-existing mitral valve prolapse   . S/P mitral valve replacement with bioprosthetic valve 03/24/2013   Redo mitral valve  replacement using 27mm Edwards [facility] mitral bovine bioprosthetic tissue valve performed via right mini thoracotomy  . Seizures ([place])   . Severe mitral regurgitation 02/20/2013  . Unsteady gait     Social History   Social History  . Marital status: Divorced    Spouse name: N/A  . Number of children: 2  . Years of education: 9   Occupational History  . disabled    Social History Main Topics  . Smoking status: Former Smoker    Packs/day: 2.00    Years: 45.00    Types: Cigarettes    Quit date: 07/30/2006  . Smokeless tobacco: Former Systems developer    Quit date: 07/31/2007  . Alcohol use No     Comment: previous alcohol use & home-made alcohol consumption  . Drug use: No     Comment: previous marijuana  . Sexual activity: Not Currently   Other Topics Concern  . Not on file   Social History Narrative   Patient is divorced and has 2 children.   Patient is right handed.   Patient has 9 th grade education.    Patient drinks diet sodas.      [facility]:   From [place] originally. Always lived in [place]. 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 Previously traveled to [place], [place], & [place]. No pets currently. Remote pet owl and parakeet exposure. No mold exposure. Enjoys fishing.     Past Surgical History:  Procedure Laterality Date  . CARDIAC VALVE REPLACEMENT    . INTRAOPERATIVE TRANSESOPHAGEAL ECHOCARDIOGRAM N/A 03/24/2013   Procedure: INTRAOPERATIVE TRANSESOPHAGEAL ECHOCARDIOGRAM;  Surgeon: Rexene Alberts, MD;  Location: [facility];  Service: Open Heart Surgery;  Laterality: N/A;  . MINIMALLY INVASIVE MAZE PROCEDURE N/A 03/24/2013   Procedure: MINIMALLY INVASIVE MAZE PROCEDURE;  Surgeon: Rexene Alberts, MD;  Location: [facility];  Service: Open Heart Surgery;  Laterality: N/A;  . MITRAL VALVE REPLACEMENT  02/01/1998   Carpentier-Edwards porcine bioprosthetic tissue valve, size 76mm, placed for complicated bacterial endocarditis  . MITRAL VALVE REPLACEMENT Right 03/24/2013    Procedure: MINIMALLY INVASIVE REDO MITRAL VALVE (MV) REPLACEMENT;  Surgeon: Rexene Alberts, MD;  Location: [facility];  Service: Open Heart Surgery;  Laterality: Right;  . PROSTATE BIOPSY    . TEE WITHOUT CARDIOVERSION N/A 02/12/2013   Procedure: TRANSESOPHAGEAL ECHOCARDIOGRAM (TEE);  Surgeon: Fay Records, MD;  Location[facility];  Service: Cardiovascular;  Laterality: N/A;  . VIDEO ASSISTED THORACOSCOPY  04/29/2007   Left VATS w/ mini thoracotomy for Left Lower Lobectomy for benign lung nodules    Family History  Problem Relation Age of Onset  . Cancer Father        prostate cancer  . Tuberculosis Father   . Heart attack Father   . Cancer Brother        prostate cancer  . Arthritis Mother   . Lung disease Neg Hx     Allergies  Allergen Reactions  . Ace Inhibitors Swelling  . Rosuvastatin Other (See Comments)    Muscle aches    Current Outpatient Prescriptions on File Prior to Visit  Medication Sig Dispense Refill  . Cholecalciferol (VITAMIN D3) 1000 UNITS CAPS Take 1 capsule by mouth daily.     Marland Kitchen ezetimibe (ZETIA) 10 MG tablet Take 1 tablet (10 mg total) by mouth daily. 90 tablet  3  . levETIRAcetam (KEPPRA) 500 MG tablet Take 1 tablet (500 mg total) by mouth 2 (two) times daily. 180 tablet 2  . metoprolol tartrate (LOPRESSOR) 25 MG tablet Take 0.5 tablets (12.5 mg total) by mouth 2 (two) times daily. 90 tablet 3  . phenytoin (DILANTIN) 100 MG ER capsule TAKE 3 CAPSULES  (300MG ) AT BEDTIME 270 capsule 3  . temazepam (RESTORIL) 15 MG capsule Take 15 mg by mouth at bedtime as needed. sleep  3  . tiotropium (SPIRIVA HANDIHALER) 18 MCG inhalation capsule INHALE THE CONTENTS OF 1 CAPSULE EVERY DAY VIA HANDIHALER 90 capsule 3  . tiZANidine (ZANAFLEX) 4 MG tablet TAKE 1 TABLET(4 MG) BY MOUTH EVERY 6 HOURS AS NEEDED FOR MUSCLE SPASMS 360 tablet 1  . traMADol (ULTRAM) 50 MG tablet Take 1 tablet (50 mg total) by mouth every 8 (eight) hours as needed. 90 tablet 0  . warfarin (COUMADIN) 6  MG tablet TAKE AS DIRECTED BY COUMADIN CLINIC 100 tablet 0   No current facility-administered medications on file prior to visit.     BP 138/70 (BP Location: Left Arm)   Temp 98.5 F (36.9 C) (Oral)   Ht 5' 4.5" (1.638 m)   Wt 139 lb (63 kg)   BMI 23.49 kg/m        Objective:   Physical Exam  Constitutional: He is oriented to person, place, and time. He appears well-developed and well-nourished. No distress.  HENT:  Head: Normocephalic and atraumatic.  Right Ear: Hearing, tympanic membrane, external ear and ear canal normal.  Left Ear: Hearing, tympanic membrane, external ear and ear canal normal.  Nose: Nose normal.  Mouth/Throat: Oropharynx is clear and moist. He has dentures. No oropharyngeal exudate.  Eyes: Pupils are equal, round, and reactive to light. Conjunctivae and EOM are normal. Right eye exhibits no discharge. Left eye exhibits no discharge. No scleral icterus.  Neck: Trachea normal and normal range of motion. Neck supple. JVD present. Carotid bruit is not present. No tracheal deviation present. No thyroid mass and no thyromegaly present.  Cardiovascular: Normal rate, normal heart sounds and intact distal pulses.  An irregularly irregular rhythm present. Exam reveals no gallop and no friction rub.   No murmur heard. Pulmonary/Chest: Effort normal and breath sounds normal. No stridor. No respiratory distress. He has no wheezes. He has no rales. He exhibits no tenderness.  Abdominal: Soft. Normal appearance and bowel sounds are normal. He exhibits no distension and no mass. There is no tenderness. There is no rebound, no guarding and no CVA tenderness.  Genitourinary:  Genitourinary Comments: Done by Urology   Musculoskeletal: Normal range of motion. He exhibits no edema, tenderness or deformity.  Lymphadenopathy:    He has no cervical adenopathy.  Neurological: He is alert and oriented to person, place, and time. He has normal reflexes. He displays normal reflexes. No  cranial nerve deficit. He exhibits normal muscle tone. Coordination normal.  Skin: Skin is warm and dry. No rash noted. He is not diaphoretic. No erythema. No pallor.  Psychiatric: He has a normal mood and affect. His behavior is normal. Judgment and thought content normal.  Nursing note and vitals reviewed.     Assessment & Plan:  1. Routine general medical examination at a health care facility - Follow up in 6 months for recheck  - Continue to stay active - Educated on the importance of diet and exercise  - Basic metabolic panel - CBC with Differential/Platelet - Hepatic function panel - Lipid panel -  TSH  2. Seizures ([place]) - Follow up with Neurology as directed   4. HYPERTENSION, BENIGN - Near goal. No change in medication  - Basic metabolic panel - CBC with Differential/Platelet - Hepatic function panel - Lipid panel - TSH  5. CEREBROVASCULAR DISEASE  - Basic metabolic panel - CBC with Differential/Platelet - Hepatic function panel - Lipid panel - TSH  6. Need for hepatitis C screening test  - Hep C Antibody  7. Medication refill  - traMADol (ULTRAM) 50 MG tablet; Take 1 tablet (50 mg total) by mouth every 8 (eight) hours as needed.  Dispense: 90 tablet; Refill: 0  8. Chronic midline low back pain without sciatica  - traMADol (ULTRAM) 50 MG tablet; Take 1 tablet (50 mg total) by mouth every 8 (eight) hours as needed.  Dispense: 90 tablet; Refill: 0  Dorothyann Peng, NP

## 2017-03-16 LAB — HEPATITIS C ANTIBODY: HCV Ab: NONREACTIVE

## 2017-03-18 ENCOUNTER — Ambulatory Visit (INDEPENDENT_AMBULATORY_CARE_PROVIDER_SITE_OTHER): Payer: Medicare HMO | Admitting: Pharmacist

## 2017-03-18 DIAGNOSIS — I4891 Unspecified atrial fibrillation: Secondary | ICD-10-CM | POA: Diagnosis not present

## 2017-03-18 DIAGNOSIS — Z5181 Encounter for therapeutic drug level monitoring: Secondary | ICD-10-CM | POA: Diagnosis not present

## 2017-03-18 LAB — POCT INR: INR: 2

## 2017-03-28 DIAGNOSIS — K5730 Diverticulosis of large intestine without perforation or abscess without bleeding: Secondary | ICD-10-CM | POA: Diagnosis not present

## 2017-03-28 DIAGNOSIS — K625 Hemorrhage of anus and rectum: Secondary | ICD-10-CM | POA: Diagnosis not present

## 2017-03-28 DIAGNOSIS — Z1211 Encounter for screening for malignant neoplasm of colon: Secondary | ICD-10-CM | POA: Diagnosis not present

## 2017-03-28 DIAGNOSIS — R194 Change in bowel habit: Secondary | ICD-10-CM | POA: Diagnosis not present

## 2017-03-29 ENCOUNTER — Ambulatory Visit (INDEPENDENT_AMBULATORY_CARE_PROVIDER_SITE_OTHER): Payer: Medicare HMO | Admitting: *Deleted

## 2017-03-29 DIAGNOSIS — I4891 Unspecified atrial fibrillation: Secondary | ICD-10-CM | POA: Diagnosis not present

## 2017-03-29 DIAGNOSIS — Z5181 Encounter for therapeutic drug level monitoring: Secondary | ICD-10-CM | POA: Diagnosis not present

## 2017-03-29 LAB — POCT INR: INR: 2.9

## 2017-04-03 ENCOUNTER — Other Ambulatory Visit: Payer: Self-pay | Admitting: Neurology

## 2017-04-03 ENCOUNTER — Other Ambulatory Visit: Payer: Self-pay | Admitting: Gastroenterology

## 2017-04-04 NOTE — Progress Notes (Signed)
Jeffery Ide MD 

## 2017-04-08 DIAGNOSIS — Z961 Presence of intraocular lens: Secondary | ICD-10-CM | POA: Diagnosis not present

## 2017-04-08 DIAGNOSIS — H26491 Other secondary cataract, right eye: Secondary | ICD-10-CM | POA: Diagnosis not present

## 2017-04-08 DIAGNOSIS — H353131 Nonexudative age-related macular degeneration, bilateral, early dry stage: Secondary | ICD-10-CM | POA: Diagnosis not present

## 2017-04-09 ENCOUNTER — Encounter (HOSPITAL_COMMUNITY): Payer: Self-pay | Admitting: *Deleted

## 2017-04-09 NOTE — Progress Notes (Signed)
Spoke with dr Jillyn Hidden about

## 2017-04-12 ENCOUNTER — Ambulatory Visit (INDEPENDENT_AMBULATORY_CARE_PROVIDER_SITE_OTHER): Payer: Medicare HMO

## 2017-04-12 ENCOUNTER — Telehealth (HOSPITAL_COMMUNITY): Payer: Self-pay

## 2017-04-12 ENCOUNTER — Telehealth: Payer: Self-pay | Admitting: Pharmacist

## 2017-04-12 ENCOUNTER — Other Ambulatory Visit: Payer: Self-pay | Admitting: *Deleted

## 2017-04-12 DIAGNOSIS — I4891 Unspecified atrial fibrillation: Secondary | ICD-10-CM | POA: Diagnosis not present

## 2017-04-12 DIAGNOSIS — Z5181 Encounter for therapeutic drug level monitoring: Secondary | ICD-10-CM

## 2017-04-12 LAB — POCT INR: INR: 3.3

## 2017-04-12 MED ORDER — ENOXAPARIN SODIUM 60 MG/0.6ML ~~LOC~~ SOLN: 60.0000 mg | Freq: Two times a day (BID) | SUBCUTANEOUS | 0 refills | Status: DC

## 2017-04-12 NOTE — Patient Instructions (Addendum)
04/12/17: No Coumadin or Lovenox.  04/13/17: Inject Lovenox 60mg  in the fatty abdominal tissue at least 2 inches from the belly button twice a day about 12 hours apart, 8am and 8pm rotate sites. No Coumadin.  04/14/17: Inject Lovenox in the fatty tissue every 12 hours, 8am and 8pm. No Coumadin.  04/15/17: Inject Lovenox in the fatty tissue in the morning at 8 am (No PM dose). No Coumadin.  04/16/17: Procedure Day - No Lovenox - Resume Coumadin in the evening or as directed by doctor (take an extra half tablet with usual dose for 2 days then resume normal dose).  04/17/17: Resume Lovenox inject in the fatty tissue every 12 hours and take Coumadin.  04/18/17: Inject Lovenox in the fatty tissue every 12 hours and take Coumadin.  04/19/17: Inject Lovenox in the fatty tissue every 12 hours and take Coumadin.  04/20/17: Inject Lovenox in the fatty tissue every 12 hours and take Coumadin.  04/21/17: Inject Lovenox in the fatty tissue every 12 hours and take Coumadin.  04/22/17: Coumadin appt to check INR.

## 2017-04-12 NOTE — Telephone Encounter (Signed)
Received VM from patient's family member Ferman Hamming that patient's medication sent to pharmacy today (Lovenox) was not approved and pharmacy is telling patient dose is too much. Attempted to return call to get additional information, no answer. Will forward to coumadin clinic and Ovidio Kin RN who had encounter with patient today per chart regarding this medication.  Renee Pain, RN

## 2017-04-12 NOTE — Telephone Encounter (Signed)
   Patient: Jeffery Copeland  MRN: [id number]  Date of Birth: 1945/11/10  Date of Visit: 04/12/2017    Patient with diagnosis of atrial fibrillation and mitral valve replacement on warfarin for anticoagulation.    Procedure: Colonospcopy Date of procedure: Sept/18/2018  CrCl = 39ml/min Platelet count = 178  Per office protocol, patient can hold warfarin  for 4 days prior to procedure.   Patient will need bridging with Lovenox (enoxaparin) around procedure.   Sincerely,  Miriam Kestler Rodriguez-Guzman PharmD, BCPS, [place] [address] [place] [address] 04/12/2017 9:03 AM

## 2017-04-15 ENCOUNTER — Telehealth: Payer: Self-pay | Admitting: Physician Assistant

## 2017-04-15 NOTE — Telephone Encounter (Signed)
Pt needs to be bridged with Lovenox while holding Coumadin prior to colonoscopy on 04/16/17, Creat 0.96, weight 63kg, CrCl 62.89, Will rx Lovenox 60mg  BID.  Dosing is appropriate 1mg /kg per 12 hrs.  Will have pharmacist call pharmacy once opens this am, [place] [phone]

## 2017-04-15 NOTE — Telephone Encounter (Signed)
New message     Pt c/o medication issue:  1. Name of Medication:  enoxaparin (LOVENOX) 60 MG/0.6ML injection Inject 0.6 mLs (60 mg total) into the skin every 12 (twelve) hours.     2. How are you currently taking this medication (dosage and times per day)?  Has not picked it up   3. Are you having a reaction (difficulty breathing--STAT)? no 4. What is your medication issue? Can not afford to pick it up

## 2017-04-16 ENCOUNTER — Ambulatory Visit (HOSPITAL_COMMUNITY): Admission: RE | Admit: 2017-04-16 | Payer: Medicare HMO | Source: Ambulatory Visit | Admitting: Gastroenterology

## 2017-04-16 SURGERY — COLONOSCOPY WITH PROPOFOL
Anesthesia: Monitor Anesthesia Care

## 2017-04-16 NOTE — Telephone Encounter (Signed)
Jeffery Copeland said you submitted a prior authorization on this yesterday - have you heard a response back yet?

## 2017-04-16 NOTE — Telephone Encounter (Signed)
Per Epic colonoscopy that was scheduled for today was cancelled.  Called spoke with pt, he states he called Dr Lorie Apley office advised them insurance would not cover Lovenox, so they cancelled procedure.  Pt states he did hold Coumadin as we instructed Friday, Saturday and Sunday.  Pt states he resumed Coumadin yesterday 04/15/17 in the pm.  Advised pt to take and extra 1/2 tablet of Coumadin today (1.5 tablets) and tomorrow (1 tablet), then resume previous dosage regimen of Coumadin.  Advised pt to keep scheduled appt with Coumadin clinic 04/22/17.  Pt verbalized understanding.

## 2017-04-22 ENCOUNTER — Telehealth: Payer: Self-pay | Admitting: Adult Health

## 2017-04-22 DIAGNOSIS — M545 Low back pain, unspecified: Secondary | ICD-10-CM

## 2017-04-22 DIAGNOSIS — G8929 Other chronic pain: Secondary | ICD-10-CM

## 2017-04-22 DIAGNOSIS — Z760 Encounter for issue of repeat prescription: Secondary | ICD-10-CM

## 2017-04-22 NOTE — Telephone Encounter (Signed)
° ° ° ° °  Pt request refill of the following:  traMADol (ULTRAM) 50 MG tablet   Phamacy: [facility] [address]

## 2017-04-24 ENCOUNTER — Ambulatory Visit (INDEPENDENT_AMBULATORY_CARE_PROVIDER_SITE_OTHER): Payer: Medicare HMO | Admitting: Adult Health

## 2017-04-24 ENCOUNTER — Encounter: Payer: Self-pay | Admitting: Adult Health

## 2017-04-24 VITALS — BP 139/71 | HR 81 | Wt 143.0 lb

## 2017-04-24 DIAGNOSIS — R569 Unspecified convulsions: Secondary | ICD-10-CM | POA: Diagnosis not present

## 2017-04-24 DIAGNOSIS — Z5181 Encounter for therapeutic drug level monitoring: Secondary | ICD-10-CM

## 2017-04-24 MED ORDER — TRAMADOL HCL 50 MG PO TABS: 50.0000 mg | ORAL_TABLET | Freq: Three times a day (TID) | ORAL | 2 refills | Status: DC | PRN

## 2017-04-24 MED ORDER — LEVETIRACETAM 500 MG PO TABS: 500.0000 mg | ORAL_TABLET | Freq: Two times a day (BID) | ORAL | 3 refills | Status: DC

## 2017-04-24 MED ORDER — PHENYTOIN SODIUM EXTENDED 100 MG PO CAPS: ORAL_CAPSULE | ORAL | 3 refills | Status: DC

## 2017-04-24 NOTE — Patient Instructions (Signed)
Your Plan:  Continue Dilantin and Keppra Blood work today If you have any seizure events please let us know If your symptoms worsen or you develop new symptoms please let us know.   Thank you for coming to see [place] at [facility]. I hope we have been able to provide you high quality care today.  You may receive a patient satisfaction survey over the next few weeks. We would appreciate your feedback and comments so that we may continue to improve ourselves and the health of our patients.

## 2017-04-24 NOTE — Telephone Encounter (Signed)
Ok to refill for three months 

## 2017-04-24 NOTE — Progress Notes (Signed)
PATIENT: Jeffery Copeland DOB: [birth year]  REASON FOR VISIT: follow up-seizures HISTORY FROM: patient  HISTORY OF PRESENT ILLNESS: Today 04/24/17 Jeffery Copeland is a 71 year old male with a history of seizure events. He returns today for follow-up. He remains on Keppra 500 mg twice a day as well as Dilantin 100 mg at bedtime. He denies any seizure events. He lives at home alone. He is able to complete all ADLs independently. He does not operate a motor vehicle. He reports that his hip is better but still sore. He continues to use a walker when ambulating. He denies any new neurological symptoms. He returns today for an evaluation.   Update 10/22/2016 ; he returns for follow-up after last visit 6 months ago. He states his had no seizures since last visit. His last seizure was in June of last year. He is tolerating Keppra 500 twice daily as well as Dilantin 300 mg at night without any significant side effects including dizziness, sleepiness, ataxia or double vision. He remains on warfarin which is tolerating well and INR has been fairly stable last 3 times. He has had no stroke or TIA symptoms. Patient continues to have significant right hip pain but does not want surgery. He is a walker with dragging of his right leg. He had fallen about a year ago and hurt his hip. He has no new complaints today.  REVIEW OF SYSTEMS: Out of a complete 14 system review of symptoms, the patient complains only of the following symptoms, and all other reviewed systems are negative.  Ringing in ears, shortness of breath, diarrhea, blurred vision, insomnia, frequent waking, daytime sleepiness, joint pain, back pain, muscle cramps, neck pain, neck stiffness, headaches  ALLERGIES: Allergies  Allergen Reactions  . Ace Inhibitors Swelling  . Rosuvastatin Other (See Comments)    Muscle aches    HOME MEDICATIONS: Outpatient Medications Prior to Visit  Medication Sig Dispense Refill  . Cholecalciferol (VITAMIN D3) 1000 UNITS  CAPS Take 1,000 Units by mouth daily.     Marland Kitchen ezetimibe (ZETIA) 10 MG tablet Take 1 tablet (10 mg total) by mouth daily. 90 tablet 3  . metoprolol tartrate (LOPRESSOR) 25 MG tablet Take 0.5 tablets (12.5 mg total) by mouth 2 (two) times daily. 90 tablet 3  . temazepam (RESTORIL) 15 MG capsule Take 15 mg by mouth at bedtime as needed. sleep  3  . tiotropium (SPIRIVA HANDIHALER) 18 MCG inhalation capsule INHALE THE CONTENTS OF 1 CAPSULE EVERY DAY VIA HANDIHALER 90 capsule 3  . tiZANidine (ZANAFLEX) 4 MG tablet TAKE 1 TABLET(4 MG) BY MOUTH EVERY 6 HOURS AS NEEDED FOR MUSCLE SPASMS 360 tablet 1  . traMADol (ULTRAM) 50 MG tablet Take 1 tablet (50 mg total) by mouth every 8 (eight) hours as needed. 90 tablet 0  . warfarin (COUMADIN) 6 MG tablet TAKE AS DIRECTED BY COUMADIN CLINIC (Patient taking differently: TAKE 0.5 TABLET ON MONDAY, WEDNESDAY, AND FRIDAY & TAKE 1 TABLET ON ALL OTHER DAYS.) 100 tablet 0  . levETIRAcetam (KEPPRA) 500 MG tablet Take 1 tablet (500 mg total) by mouth 2 (two) times daily. 180 tablet 2  . phenytoin (DILANTIN) 100 MG ER capsule TAKE 3 CAPSULES (300MG ) AT BEDTIME 270 capsule 0  . enoxaparin (LOVENOX) 60 MG/0.6ML injection Inject 0.6 mLs (60 mg total) into the skin every 12 (twelve) hours. 20 Syringe 0   No facility-administered medications prior to visit.     PAST MEDICAL HISTORY: Past Medical History:  Diagnosis Date  . Acute and  subacute bacterial endocarditis 1999   group G Streptococcus  . Atrial fibrillation ([place]) 11/11/2008   Qualifier: Diagnosis of  By: Lia Foyer, MD, Jaquelyn Bitter   . CARDIOMYOPATHY 10/20/2008   Qualifier: Diagnosis of  By: Owens Shark, RN, BSN, Lauren    . Cerebrovascular disease, unspecified   . Closed right acetabular fracture ([place])   . COPD (chronic obstructive pulmonary disease) ([place]) 1999  . Epidural hematoma ([place]) 03/01/2011   Recent fall 2012   . Esophageal reflux   . Generalized osteoarthrosis, unspecified site   . History of tobacco abuse    . Hypertension   . HYPERTENSION, BENIGN 10/20/2008   Qualifier: Diagnosis of  By: Owens Shark, RN, BSN, Lauren    . Hyponatremia   . Insomnia   . Iron deficiency anemia, unspecified   . ISCHEMIC COLITIS, HX OF 10/20/2008   Qualifier: Diagnosis of  By: Owens Shark, RN, BSN, Lauren    . Lung nodules 2008, 2014   LLL nodule removed 2008 with LLL lobectomy,  RLL nodule 41mm observation  . Prostate cancer ([place]) 2015  . S/P mitral valve replacement 02/01/1998   Carpentier-Edwards porcine bioprosthetic tissue valve, size 47mm Placed for acute and subacute bacterial endocarditis (group G Streptococcus) with pre-existing mitral valve prolapse   . S/P mitral valve replacement with bioprosthetic valve 03/24/2013   Redo mitral valve replacement using 88mm [facility] mitral bovine bioprosthetic tissue valve performed via right mini thoracotomy  . Seizures ([place])    last seizure 1 year ago  . Severe mitral regurgitation 02/20/2013  . Unsteady gait     PAST SURGICAL HISTORY: Past Surgical History:  Procedure Laterality Date  . CARDIAC VALVE REPLACEMENT    . INTRAOPERATIVE TRANSESOPHAGEAL ECHOCARDIOGRAM N/A 03/24/2013   Procedure: INTRAOPERATIVE TRANSESOPHAGEAL ECHOCARDIOGRAM;  Surgeon: Rexene Alberts, MD;  Location: [facility];  Service: Open Heart Surgery;  Laterality: N/A;  . MINIMALLY INVASIVE MAZE PROCEDURE N/A 03/24/2013   Procedure: MINIMALLY INVASIVE MAZE PROCEDURE;  Surgeon: Rexene Alberts, MD;  Location: [facility];  Service: Open Heart Surgery;  Laterality: N/A;  . MITRAL VALVE REPLACEMENT  02/01/1998   Carpentier-Edwards porcine bioprosthetic tissue valve, size 36mm, placed for complicated bacterial endocarditis  . MITRAL VALVE REPLACEMENT Right 03/24/2013   Procedure: MINIMALLY INVASIVE REDO MITRAL VALVE (MV) REPLACEMENT;  Surgeon: Rexene Alberts, MD;  Location: [facility];  Service: Open Heart Surgery;  Laterality: Right;  . PROSTATE BIOPSY    . radiation treatment tx for prostate cancer  2015  . TEE WITHOUT  CARDIOVERSION N/A 02/12/2013   Procedure: TRANSESOPHAGEAL ECHOCARDIOGRAM (TEE);  Surgeon: Fay Records, MD;  Location: [facility];  Service: Cardiovascular;  Laterality: N/A;  . VIDEO ASSISTED THORACOSCOPY  04/29/2007   Left VATS w/ mini thoracotomy for Left Lower Lobectomy for benign lung nodules    FAMILY HISTORY: Family History  Problem Relation Age of Onset  . Cancer Father        prostate cancer  . Tuberculosis Father   . Heart attack Father   . Cancer Brother        prostate cancer  . Arthritis Mother   . Lung disease Neg Hx     SOCIAL HISTORY: Social History   Social History  . Marital status: Divorced    Spouse name: N/A  . Number of children: 2  . Years of education: 9   Occupational History  . disabled    Social History Main Topics  . Smoking status: Former Smoker    Packs/day: 2.00  Years: 45.00    Types: Cigarettes    Quit date: 07/30/2006  . Smokeless tobacco: Former Systems developer    Quit date: 07/31/2007  . Alcohol use No     Comment: previous alcohol use & home-made alcohol consumption  . Drug use: No  . Sexual activity: Not Currently   Other Topics Concern  . Not on file   Social History Narrative   Patient is divorced and has 2 children.   Patient is right handed.   Patient has 9 th grade education.    Patient drinks diet sodas.      [facility]:   From [place] originally. Always lived in [place]. 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 Previously traveled to [place], [place], & [place]. No pets currently. Remote pet owl and parakeet exposure. No mold exposure. Enjoys fishing.       PHYSICAL EXAM  Vitals:   04/24/17 0821  BP: 139/71  Pulse: 81  Weight: 143 lb (64.9 kg)   Body mass index is 24.17 kg/m.  Generalized: Well developed, in no acute distress   Neurological examination  Mentation: Alert oriented to time, place, history taking. Follows all commands speech and language fluent Cranial nerve II-XII: Pupils were equal  round reactive to light. Extraocular movements were full, visual field were full on confrontational test. Facial sensation and strength were normal. Uvula tongue midline. Head turning and shoulder shrug  were normal and symmetric. Motor: The motor testing reveals 5 over 5 strength of all 4 extremities. Good symmetric motor tone is noted throughout.  Sensory: Sensory testing is intact to soft touch on all 4 extremities. No evidence of extinction is noted.  Coordination: Cerebellar testing reveals good finger-nose-finger and heel-to-shin bilaterally.  Gait and station: Patient uses a walker when ambulating. Tandem gait not attempted. Reflexes: Deep tendon reflexes are symmetric and normal bilaterally.   DIAGNOSTIC DATA (LABS, IMAGING, TESTING) - I reviewed patient records, labs, notes, testing and imaging myself where available.  Lab Results  Component Value Date   WBC 5.3 03/15/2017   HGB 12.5 (L) 03/15/2017   HCT 37.6 (L) 03/15/2017   MCV 100.0 03/15/2017   PLT 178.0 03/15/2017      Component Value Date/Time   NA 138 03/15/2017 1056   NA 142 04/18/2016 1000   K 4.4 03/15/2017 1056   CL 101 03/15/2017 1056   CO2 33 (H) 03/15/2017 1056   GLUCOSE 83 03/15/2017 1056   BUN 8 03/15/2017 1056   BUN 8 04/18/2016 1000   CREATININE 0.96 03/15/2017 1056   CREATININE 1.07 08/12/2015 1500   CALCIUM 8.9 03/15/2017 1056   PROT 6.0 03/15/2017 1056   PROT 6.3 04/18/2016 1000   ALBUMIN 3.9 03/15/2017 1056   ALBUMIN 4.2 04/18/2016 1000   AST 14 03/15/2017 1056   ALT 13 03/15/2017 1056   ALKPHOS 98 03/15/2017 1056   BILITOT 0.5 03/15/2017 1056   BILITOT 0.4 04/18/2016 1000   GFRNONAA 72 04/18/2016 1000   GFRAA 84 04/18/2016 1000   Lab Results  Component Value Date   CHOL 119 03/15/2017   HDL 48.90 03/15/2017   LDLCALC 58 03/15/2017   TRIG 59.0 03/15/2017   CHOLHDL 2 03/15/2017   Lab Results  Component Value Date   HGBA1C 5.4 03/20/2013    Lab Results  Component Value Date   TSH  1.26 03/15/2017      ASSESSMENT AND PLAN 71 y.o. year old male  has a past medical history of Acute and subacute bacterial  endocarditis (1999); Atrial fibrillation ([place]) (11/11/2008); CARDIOMYOPATHY (10/20/2008); Cerebrovascular disease, unspecified; Closed right acetabular fracture ([place]); COPD (chronic obstructive pulmonary disease) ([place]) (1999); Epidural hematoma ([place]) (03/01/2011); Esophageal reflux; Generalized osteoarthrosis, unspecified site; History of tobacco abuse; Hypertension; HYPERTENSION, BENIGN (10/20/2008); Hyponatremia; Insomnia; Iron deficiency anemia, unspecified; ISCHEMIC COLITIS, HX OF (10/20/2008); Lung nodules (2008, 2014); Prostate cancer ([place]) (2015); S/P mitral valve replacement (02/01/1998); S/P mitral valve replacement with bioprosthetic valve (03/24/2013); Seizures ([place]); Severe mitral regurgitation (02/20/2013); and Unsteady gait. here with :  1. Seizures  Overall the patient is doing well. Will continue on Keppra and Dilantin. I will check blood work today. He is advised that if his symptoms worsen or he develops new symptoms he should let us know. Will follow-up in 6 months or sooner if needed.  I spent 15 minutes with the patient. 50% of this time was spent discussing his medication and seizure precautions  Meds ordered this encounter  Medications  . levETIRAcetam (KEPPRA) 500 MG tablet    Sig: Take 1 tablet (500 mg total) by mouth 2 (two) times daily.    Dispense:  180 tablet    Refill:  3    Pt will be receiving mail order in 5-7 days.    Order Specific Question:   Supervising Provider    Answer:   Kathrynn Ducking [[id number]]  . phenytoin (DILANTIN) 100 MG ER capsule    Sig: TAKE 3 CAPSULES (300MG ) AT BEDTIME    Dispense:  270 capsule    Refill:  3    Order Specific Question:   Supervising Provider    Answer:   Kathrynn Ducking [[id number]]      Ward Givens, MSN, NP-C 04/24/2017, 8:52 AM [facility] [address], [place], [place]  [address] [phone]

## 2017-04-24 NOTE — Telephone Encounter (Signed)
Last filled on 03/15/17 #90 and last seen on same day for annual No future appointment scheduled at this time. Please advise.  Thanks!!

## 2017-04-24 NOTE — Telephone Encounter (Signed)
Called in to [place] at the pharmacy.

## 2017-04-25 ENCOUNTER — Telehealth: Payer: Self-pay | Admitting: *Deleted

## 2017-04-25 ENCOUNTER — Ambulatory Visit (INDEPENDENT_AMBULATORY_CARE_PROVIDER_SITE_OTHER): Payer: Medicare HMO | Admitting: *Deleted

## 2017-04-25 DIAGNOSIS — I4891 Unspecified atrial fibrillation: Secondary | ICD-10-CM

## 2017-04-25 DIAGNOSIS — Z5181 Encounter for therapeutic drug level monitoring: Secondary | ICD-10-CM

## 2017-04-25 LAB — CBC WITH DIFFERENTIAL/PLATELET
Basophils Absolute: 0 10*3/uL (ref 0.0–0.2)
Basos: 1 %
EOS (ABSOLUTE): 0.1 10*3/uL (ref 0.0–0.4)
EOS: 2 %
HEMATOCRIT: 38.2 % (ref 37.5–51.0)
HEMOGLOBIN: 12.6 g/dL — AB (ref 13.0–17.7)
IMMATURE GRANS (ABS): 0 10*3/uL (ref 0.0–0.1)
IMMATURE GRANULOCYTES: 0 %
LYMPHS: 12 %
Lymphocytes Absolute: 0.6 10*3/uL — ABNORMAL LOW (ref 0.7–3.1)
MCH: 32.8 pg (ref 26.6–33.0)
MCHC: 33 g/dL (ref 31.5–35.7)
MCV: 100 fL — ABNORMAL HIGH (ref 79–97)
MONOCYTES: 15 %
MONOS ABS: 0.8 10*3/uL (ref 0.1–0.9)
NEUTROS PCT: 70 %
Neutrophils Absolute: 3.5 10*3/uL (ref 1.4–7.0)
Platelets: 197 10*3/uL (ref 150–379)
RBC: 3.84 x10E6/uL — AB (ref 4.14–5.80)
RDW: 13.3 % (ref 12.3–15.4)
WBC: 5.1 10*3/uL (ref 3.4–10.8)

## 2017-04-25 LAB — COMPREHENSIVE METABOLIC PANEL
ALK PHOS: 113 IU/L (ref 39–117)
ALT: 16 IU/L (ref 0–44)
AST: 20 IU/L (ref 0–40)
Albumin/Globulin Ratio: 2 (ref 1.2–2.2)
Albumin: 4.3 g/dL (ref 3.5–4.8)
BUN/Creatinine Ratio: 8 — ABNORMAL LOW (ref 10–24)
BUN: 9 mg/dL (ref 8–27)
Bilirubin Total: 0.3 mg/dL (ref 0.0–1.2)
CALCIUM: 9.2 mg/dL (ref 8.6–10.2)
CO2: 27 mmol/L (ref 20–29)
CREATININE: 1.1 mg/dL (ref 0.76–1.27)
Chloride: 101 mmol/L (ref 96–106)
GFR calc Af Amer: 78 mL/min/{1.73_m2} (ref >59)
GFR, EST NON AFRICAN AMERICAN: 67 mL/min/{1.73_m2} (ref >59)
GLUCOSE: 91 mg/dL (ref 65–99)
Globulin, Total: 2.2 g/dL (ref 1.5–4.5)
Potassium: 4.2 mmol/L (ref 3.5–5.2)
Sodium: 141 mmol/L (ref 134–144)
Total Protein: 6.5 g/dL (ref 6.0–8.5)

## 2017-04-25 LAB — PHENYTOIN LEVEL, TOTAL: Phenytoin (Dilantin), Serum: 11 ug/mL (ref 10.0–20.0)

## 2017-04-25 LAB — POCT INR: INR: 2.4

## 2017-04-25 NOTE — Progress Notes (Signed)
I agree with the above plan 

## 2017-04-25 NOTE — Telephone Encounter (Signed)
LVM informing patient his lab work is consistent with the previous lab work, so basically it is stable. Left number for any questions.

## 2017-04-30 NOTE — Telephone Encounter (Signed)
Patient called office and has been advised of message below.  Patient voiced understanding and has no questions.  FYI

## 2017-05-09 ENCOUNTER — Ambulatory Visit (INDEPENDENT_AMBULATORY_CARE_PROVIDER_SITE_OTHER): Payer: Medicare HMO | Admitting: Pharmacist

## 2017-05-09 DIAGNOSIS — I4891 Unspecified atrial fibrillation: Secondary | ICD-10-CM

## 2017-05-09 DIAGNOSIS — Z5181 Encounter for therapeutic drug level monitoring: Secondary | ICD-10-CM | POA: Diagnosis not present

## 2017-05-09 LAB — POCT INR: INR: 3.5

## 2017-05-27 DIAGNOSIS — Z8546 Personal history of malignant neoplasm of prostate: Secondary | ICD-10-CM | POA: Diagnosis not present

## 2017-05-31 ENCOUNTER — Encounter: Payer: Self-pay | Admitting: Family Medicine

## 2017-05-31 DIAGNOSIS — H26491 Other secondary cataract, right eye: Secondary | ICD-10-CM | POA: Diagnosis not present

## 2017-06-19 ENCOUNTER — Ambulatory Visit (INDEPENDENT_AMBULATORY_CARE_PROVIDER_SITE_OTHER): Payer: Medicare HMO | Admitting: *Deleted

## 2017-06-19 DIAGNOSIS — I4891 Unspecified atrial fibrillation: Secondary | ICD-10-CM

## 2017-06-19 DIAGNOSIS — Z5181 Encounter for therapeutic drug level monitoring: Secondary | ICD-10-CM | POA: Diagnosis not present

## 2017-06-19 LAB — POCT INR: INR: 2.6

## 2017-06-19 NOTE — Patient Instructions (Signed)
Continue taking 1 tablet daily except 1/2 tablet on Monday, Wednesday, and Friday. Recheck INR in 4 weeks.

## 2017-06-30 ENCOUNTER — Other Ambulatory Visit: Payer: Self-pay | Admitting: Adult Health

## 2017-06-30 DIAGNOSIS — Z760 Encounter for issue of repeat prescription: Secondary | ICD-10-CM

## 2017-06-30 DIAGNOSIS — M545 Low back pain, unspecified: Secondary | ICD-10-CM

## 2017-06-30 DIAGNOSIS — G8929 Other chronic pain: Secondary | ICD-10-CM

## 2017-07-02 NOTE — Telephone Encounter (Signed)
Ok to refill + 2  

## 2017-07-02 NOTE — Telephone Encounter (Signed)
Called to the pharmacy and left on machine. 

## 2017-07-17 ENCOUNTER — Ambulatory Visit (INDEPENDENT_AMBULATORY_CARE_PROVIDER_SITE_OTHER): Payer: Medicare HMO

## 2017-07-17 DIAGNOSIS — I4891 Unspecified atrial fibrillation: Secondary | ICD-10-CM | POA: Diagnosis not present

## 2017-07-17 DIAGNOSIS — Z5181 Encounter for therapeutic drug level monitoring: Secondary | ICD-10-CM

## 2017-07-17 LAB — POCT INR: INR: 3.6

## 2017-07-17 NOTE — Patient Instructions (Signed)
Skip today's dosage of Coumadin, then resume same dosage 1 tablet daily except 1/2 tablet on Mondays, Wednesdays, and Fridays.  Recheck INR in 4 weeks.

## 2017-08-14 ENCOUNTER — Ambulatory Visit (INDEPENDENT_AMBULATORY_CARE_PROVIDER_SITE_OTHER): Payer: Medicare HMO | Admitting: *Deleted

## 2017-08-14 DIAGNOSIS — Z7901 Long term (current) use of anticoagulants: Secondary | ICD-10-CM

## 2017-08-14 DIAGNOSIS — I4891 Unspecified atrial fibrillation: Secondary | ICD-10-CM

## 2017-08-14 DIAGNOSIS — Z5181 Encounter for therapeutic drug level monitoring: Secondary | ICD-10-CM

## 2017-08-14 LAB — POCT INR: INR: 4.6

## 2017-08-14 NOTE — Patient Instructions (Signed)
Description   Skip today's dosage of Coumadin, tomorrow take 1/2 tablet, then change your dose to 1/2 tablet daily except 1 tablet on Sundays, Tuesdays and Thursdays.  Recheck INR in 2 weeks.

## 2017-08-19 ENCOUNTER — Other Ambulatory Visit: Payer: Self-pay | Admitting: Cardiology

## 2017-08-27 ENCOUNTER — Other Ambulatory Visit: Payer: Self-pay | Admitting: Family Medicine

## 2017-08-27 DIAGNOSIS — Z760 Encounter for issue of repeat prescription: Secondary | ICD-10-CM

## 2017-08-27 MED ORDER — TIZANIDINE HCL 4 MG PO TABS: ORAL_TABLET | ORAL | 1 refills | Status: DC

## 2017-08-28 ENCOUNTER — Ambulatory Visit (INDEPENDENT_AMBULATORY_CARE_PROVIDER_SITE_OTHER): Payer: Medicare HMO | Admitting: *Deleted

## 2017-08-28 DIAGNOSIS — I4891 Unspecified atrial fibrillation: Secondary | ICD-10-CM | POA: Diagnosis not present

## 2017-08-28 DIAGNOSIS — Z5181 Encounter for therapeutic drug level monitoring: Secondary | ICD-10-CM

## 2017-08-28 DIAGNOSIS — Z7901 Long term (current) use of anticoagulants: Secondary | ICD-10-CM | POA: Diagnosis not present

## 2017-08-28 LAB — POCT INR: INR: 2.7

## 2017-08-28 NOTE — Patient Instructions (Signed)
Description   Continue taking 1/2 tablet daily except 1 tablet on Sundays, Tuesdays and Thursdays.  Recheck INR in 3 weeks.

## 2017-09-03 ENCOUNTER — Telehealth: Payer: Self-pay | Admitting: Adult Health

## 2017-09-03 MED ORDER — TIOTROPIUM BROMIDE MONOHYDRATE 18 MCG IN CAPS: ORAL_CAPSULE | RESPIRATORY_TRACT | 3 refills | Status: DC

## 2017-09-03 NOTE — Telephone Encounter (Signed)
Called and spoke with patient, sent refill of medication to patients verified pharmacy. Nothing further needed.

## 2017-09-04 ENCOUNTER — Telehealth: Payer: Self-pay | Admitting: Adult Health

## 2017-09-04 DIAGNOSIS — Z760 Encounter for issue of repeat prescription: Secondary | ICD-10-CM

## 2017-09-04 DIAGNOSIS — M545 Low back pain, unspecified: Secondary | ICD-10-CM

## 2017-09-04 DIAGNOSIS — G8929 Other chronic pain: Secondary | ICD-10-CM

## 2017-09-04 NOTE — Telephone Encounter (Signed)
Copied from [place] [phone]. Topic: Quick Communication - Rx Refill/Question >> Sep 04, 2017  2:47 PM Scherrie Gerlach wrote: Medication: ezetimibe (ZETIA) 10 MG tablet        (pt wants this now sent to mail order/does not want to pick up at [facility])  traMADol Veatrice Bourbon) 50 MG tablet  Both these to  [facility], [place] [phone] (Phone) [phone] (Fax)

## 2017-09-05 MED ORDER — EZETIMIBE 10 MG PO TABS
10.0000 mg | ORAL_TABLET | Freq: Every day | ORAL | 3 refills | Status: DC
Start: 2017-09-05 — End: 2018-08-22

## 2017-09-05 MED ORDER — TRAMADOL HCL 50 MG PO TABS: ORAL_TABLET | ORAL | 0 refills | Status: DC

## 2017-09-05 NOTE — Telephone Encounter (Signed)
Ok to send in zetia for a year. I faxed a form for the Tramadol that [facility] sent me earlier this week

## 2017-09-05 NOTE — Telephone Encounter (Signed)
Spoke to [employer]  Rx was not received.  Called in for 30 days.

## 2017-09-05 NOTE — Telephone Encounter (Signed)
Zetia sent to the pharmacy for 1 year.  Will follow up on Tramadol.

## 2017-09-18 ENCOUNTER — Ambulatory Visit (INDEPENDENT_AMBULATORY_CARE_PROVIDER_SITE_OTHER): Payer: Medicare HMO

## 2017-09-18 DIAGNOSIS — Z5181 Encounter for therapeutic drug level monitoring: Secondary | ICD-10-CM | POA: Diagnosis not present

## 2017-09-18 DIAGNOSIS — I4891 Unspecified atrial fibrillation: Secondary | ICD-10-CM

## 2017-09-18 LAB — POCT INR: INR: 2.4

## 2017-09-18 NOTE — Patient Instructions (Signed)
Description   Take 1 tablet today, then resume same dosage 1/2 tablet daily except 1 tablet on Sundays, Tuesdays and Thursdays.  Recheck INR in 4 weeks.

## 2017-10-16 ENCOUNTER — Ambulatory Visit (INDEPENDENT_AMBULATORY_CARE_PROVIDER_SITE_OTHER): Payer: Medicare HMO | Admitting: Pharmacist

## 2017-10-16 DIAGNOSIS — I4891 Unspecified atrial fibrillation: Secondary | ICD-10-CM

## 2017-10-16 DIAGNOSIS — Z5181 Encounter for therapeutic drug level monitoring: Secondary | ICD-10-CM

## 2017-10-16 DIAGNOSIS — Z7901 Long term (current) use of anticoagulants: Secondary | ICD-10-CM

## 2017-10-16 LAB — POCT INR: INR: 3.3

## 2017-10-16 NOTE — Patient Instructions (Signed)
Description   Continue taking 1/2 tablet daily except 1 tablet on Sundays, Tuesdays and Thursdays.  Recheck INR in 4 weeks.

## 2017-10-22 ENCOUNTER — Encounter: Payer: Self-pay | Admitting: Adult Health

## 2017-10-22 ENCOUNTER — Encounter (INDEPENDENT_AMBULATORY_CARE_PROVIDER_SITE_OTHER): Payer: Self-pay

## 2017-10-22 ENCOUNTER — Ambulatory Visit (INDEPENDENT_AMBULATORY_CARE_PROVIDER_SITE_OTHER): Payer: Medicare HMO | Admitting: Adult Health

## 2017-10-22 VITALS — BP 130/80 | HR 72 | Wt 138.4 lb

## 2017-10-22 DIAGNOSIS — R569 Unspecified convulsions: Secondary | ICD-10-CM

## 2017-10-22 DIAGNOSIS — Z5181 Encounter for therapeutic drug level monitoring: Secondary | ICD-10-CM | POA: Diagnosis not present

## 2017-10-22 DIAGNOSIS — Z8673 Personal history of transient ischemic attack (TIA), and cerebral infarction without residual deficits: Secondary | ICD-10-CM | POA: Diagnosis not present

## 2017-10-22 NOTE — Patient Instructions (Signed)
Your Plan:  Continue Keppra and Dilantin If your symptoms worsen or you develop new symptoms please let us know.   Thank you for coming to see [place] at [facility]. I hope we have been able to provide you high quality care today.  You may receive a patient satisfaction survey over the next few weeks. We would appreciate your feedback and comments so that we may continue to improve ourselves and the health of our patients.

## 2017-10-22 NOTE — Progress Notes (Signed)
PATIENT: Jeffery Copeland DOB: 1946/03/08  REASON FOR VISIT: follow up HISTORY FROM: patient  HISTORY OF PRESENT ILLNESS: Today 10/22/17 Jeffery Copeland is a 72 year old male with a history of seizures and stroke.  He returns today for follow-up.  He remains on Keppra and Dilantin.  He denies any seizure events.  He does not operate a motor vehicle.  He is able to complete all ADLs independently.  He denies any changes with his gait or balance.  Denies any recent falls.  He remains on Coumadin.  Reports that his INR numbers have remained stable.  He denies any strokelike symptoms.  He returns today for evaluation.  HISTORY 04/24/17 Jeffery Copeland is a 72 year old male with a history of seizure events. He returns today for follow-up. He remains on Keppra 500 mg twice a day as well as Dilantin 100 mg at bedtime. He denies any seizure events. He lives at home alone. He is able to complete all ADLs independently. He does not operate a motor vehicle. He reports that his hip is better but still sore. He continues to use a walker when ambulating. He denies any new neurological symptoms. He returns today for an evaluation.   REVIEW OF SYSTEMS: Out of a complete 14 system review of symptoms, the patient complains only of the following symptoms, and all other reviewed systems are negative.  See HPI  ALLERGIES: Allergies  Allergen Reactions  . Ace Inhibitors Swelling  . Rosuvastatin Other (See Comments)    Muscle aches    HOME MEDICATIONS: Outpatient Medications Prior to Visit  Medication Sig Dispense Refill  . Cholecalciferol (VITAMIN D3) 1000 UNITS CAPS Take 1,000 Units by mouth daily.     Marland Kitchen ezetimibe (ZETIA) 10 MG tablet Take 1 tablet (10 mg total) by mouth daily. 90 tablet 3  . levETIRAcetam (KEPPRA) 500 MG tablet Take 1 tablet (500 mg total) by mouth 2 (two) times daily. 180 tablet 3  . metoprolol tartrate (LOPRESSOR) 25 MG tablet Take 0.5 tablets (12.5 mg total) by mouth 2 (two) times daily. 90 tablet  3  . phenytoin (DILANTIN) 100 MG ER capsule TAKE 3 CAPSULES (300MG ) AT BEDTIME 270 capsule 3  . temazepam (RESTORIL) 15 MG capsule Take 15 mg by mouth at bedtime as needed. sleep  3  . tiotropium (SPIRIVA HANDIHALER) 18 MCG inhalation capsule INHALE THE CONTENTS OF 1 CAPSULE EVERY DAY VIA HANDIHALER 90 capsule 3  . tiZANidine (ZANAFLEX) 4 MG tablet TAKE 1 TABLET(4 MG) BY MOUTH EVERY 6 HOURS AS NEEDED FOR MUSCLE SPASMS 360 tablet 1  . traMADol (ULTRAM) 50 MG tablet take 1 tablet by mouth every 8 hours if needed 90 tablet 0  . warfarin (COUMADIN) 6 MG tablet TAKE AS DIRECTED BY COUMADIN CLINIC 90 tablet 0   No facility-administered medications prior to visit.     PAST MEDICAL HISTORY: Past Medical History:  Diagnosis Date  . Acute and subacute bacterial endocarditis 1999   group G Streptococcus  . Atrial fibrillation ([place]) 11/11/2008   Qualifier: Diagnosis of  By: Lia Foyer, MD, Jaquelyn Bitter   . CARDIOMYOPATHY 10/20/2008   Qualifier: Diagnosis of  By: Owens Shark, RN, BSN, Lauren    . Cerebrovascular disease, unspecified   . Closed right acetabular fracture ([place])   . COPD (chronic obstructive pulmonary disease) ([place]) 1999  . Epidural hematoma ([place]) 03/01/2011   Recent fall 2012   . Esophageal reflux   . Generalized osteoarthrosis, unspecified site   . History of tobacco abuse   .  Hypertension   . HYPERTENSION, BENIGN 10/20/2008   Qualifier: Diagnosis of  By: Owens Shark, RN, BSN, Lauren    . Hyponatremia   . Insomnia   . Iron deficiency anemia, unspecified   . ISCHEMIC COLITIS, HX OF 10/20/2008   Qualifier: Diagnosis of  By: Owens Shark, RN, BSN, Lauren    . Lung nodules 2008, 2014   LLL nodule removed 2008 with LLL lobectomy,  RLL nodule 42mm observation  . Prostate cancer ([place]) 2015  . S/P mitral valve replacement 02/01/1998   Carpentier-Edwards porcine bioprosthetic tissue valve, size 89mm Placed for acute and subacute bacterial endocarditis (group G Streptococcus) with pre-existing mitral valve  prolapse   . S/P mitral valve replacement with bioprosthetic valve 03/24/2013   Redo mitral valve replacement using 58mm Edwards [facility] mitral bovine bioprosthetic tissue valve performed via right mini thoracotomy  . Seizures ([place])    last seizure 1 year ago  . Severe mitral regurgitation 02/20/2013  . Unsteady gait     PAST SURGICAL HISTORY: Past Surgical History:  Procedure Laterality Date  . CARDIAC VALVE REPLACEMENT    . INTRAOPERATIVE TRANSESOPHAGEAL ECHOCARDIOGRAM N/A 03/24/2013   Procedure: INTRAOPERATIVE TRANSESOPHAGEAL ECHOCARDIOGRAM;  Surgeon: Rexene Alberts, MD;  Location: [place];  Service: Open Heart Surgery;  Laterality: N/A;  . MINIMALLY INVASIVE MAZE PROCEDURE N/A 03/24/2013   Procedure: MINIMALLY INVASIVE MAZE PROCEDURE;  Surgeon: Rexene Alberts, MD;  Location: [facility];  Service: Open Heart Surgery;  Laterality: N/A;  . MITRAL VALVE REPLACEMENT  02/01/1998   Carpentier-Edwards porcine bioprosthetic tissue valve, size 2mm, placed for complicated bacterial endocarditis  . MITRAL VALVE REPLACEMENT Right 03/24/2013   Procedure: MINIMALLY INVASIVE REDO MITRAL VALVE (MV) REPLACEMENT;  Surgeon: Rexene Alberts, MD;  Location: [place];  Service: Open Heart Surgery;  Laterality: Right;  . PROSTATE BIOPSY    . radiation treatment tx for prostate cancer  2015  . TEE WITHOUT CARDIOVERSION N/A 02/12/2013   Procedure: TRANSESOPHAGEAL ECHOCARDIOGRAM (TEE);  Surgeon: Fay Records, MD;  Location[facility];  Service: Cardiovascular;  Laterality: N/A;  . VIDEO ASSISTED THORACOSCOPY  04/29/2007   Left VATS w/ mini thoracotomy for Left Lower Lobectomy for benign lung nodules    FAMILY HISTORY: Family History  Problem Relation Age of Onset  . Cancer Father        prostate cancer  . Tuberculosis Father   . Heart attack Father   . Cancer Brother        prostate cancer  . Arthritis Mother   . Lung disease Neg Hx     SOCIAL HISTORY: Social History   Socioeconomic History  . Marital  status: Divorced    Spouse name: Not on file  . Number of children: 2  . Years of education: 9  . Highest education level: Not on file  Occupational History  . Occupation: disabled  Social Needs  . Financial resource strain: Not on file  . Food insecurity:    Worry: Not on file    Inability: Not on file  . Transportation needs:    Medical: Not on file    Non-medical: Not on file  Tobacco Use  . Smoking status: Former Smoker    Packs/day: 2.00    Years: 45.00    Pack years: 90.00    Types: Cigarettes    Last attempt to quit: 07/30/2006    Years since quitting: 11.2  . Smokeless tobacco: Former Systems developer    Quit date: 07/31/2007  Substance and Sexual Activity  . Alcohol  use: No    Alcohol/week: 0.0 oz    Comment: previous alcohol use & home-made alcohol consumption  . Drug use: No  . Sexual activity: Not Currently  Lifestyle  . Physical activity:    Days per week: Not on file    Minutes per session: Not on file  . Stress: Not on file  Relationships  . Social connections:    Talks on phone: Not on file    Gets together: Not on file    Attends religious service: Not on file    Active member of club or organization: Not on file    Attends meetings of clubs or organizations: Not on file    Relationship status: Not on file  . Intimate partner violence:    Fear of current or ex partner: Not on file    Emotionally abused: Not on file    Physically abused: Not on file    Forced sexual activity: Not on file  Other Topics Concern  . Not on file  Social History Narrative   Patient is divorced and has 2 children.   Patient is right handed.   Patient has 9 th grade education.    Patient drinks diet sodas.      [facility]:   From [place] originally. Always lived in [place]. 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 Previously traveled to [place], [place], & [place]. No pets currently. Remote pet owl and parakeet exposure. No mold exposure. Enjoys fishing.        PHYSICAL EXAM  Vitals:   10/22/17 1012  BP: 130/80  Pulse: 72  Weight: 138 lb 6.4 oz (62.8 kg)   Body mass index is 23.39 kg/m.  Generalized: Well developed, in no acute distress   Neurological examination  Mentation: Alert oriented to time, place, history taking. Follows all commands speech and language fluent Cranial nerve II-XII: Pupils were equal round reactive to light. Extraocular movements were full, visual field were full on confrontational test. Facial sensation and strength were normal. Uvula tongue midline. Head turning and shoulder shrug  were normal and symmetric. Motor: The motor testing reveals 5 over 5 strength of all 4 extremities. Good symmetric motor tone is noted throughout.  Sensory: Sensory testing is intact to soft touch on all 4 extremities. No evidence of extinction is noted.  Coordination: Cerebellar testing reveals good finger-nose-finger and heel-to-shin bilaterally.  Gait and station: Patient uses a cane when ambulating. Reflexes: Deep tendon reflexes are symmetric and normal bilaterally.   DIAGNOSTIC DATA (LABS, IMAGING, TESTING) - I reviewed patient records, labs, notes, testing and imaging myself where available.  Lab Results  Component Value Date   WBC 5.1 04/24/2017   HGB 12.6 (L) 04/24/2017   HCT 38.2 04/24/2017   MCV 100 (H) 04/24/2017   PLT 197 04/24/2017      Component Value Date/Time   NA 141 04/24/2017 0857   K 4.2 04/24/2017 0857   CL 101 04/24/2017 0857   CO2 27 04/24/2017 0857   GLUCOSE 91 04/24/2017 0857   GLUCOSE 83 03/15/2017 1056   BUN 9 04/24/2017 0857   CREATININE 1.10 04/24/2017 0857   CREATININE 1.07 08/12/2015 1500   CALCIUM 9.2 04/24/2017 0857   PROT 6.5 04/24/2017 0857   ALBUMIN 4.3 04/24/2017 0857   AST 20 04/24/2017 0857   ALT 16 04/24/2017 0857   ALKPHOS 113 04/24/2017 0857   BILITOT 0.3 04/24/2017 0857   GFRNONAA 67 04/24/2017 0857   GFRAA 78 04/24/2017 0857  Lab Results  Component Value Date    CHOL 119 03/15/2017   HDL 48.90 03/15/2017   LDLCALC 58 03/15/2017   TRIG 59.0 03/15/2017   CHOLHDL 2 03/15/2017   Lab Results  Component Value Date   HGBA1C 5.4 03/20/2013   No results found for: VITAMINB12 Lab Results  Component Value Date   TSH 1.26 03/15/2017      ASSESSMENT AND PLAN 72 y.o. year old male  has a past medical history of Acute and subacute bacterial endocarditis (1999), Atrial fibrillation ([place]) (11/11/2008), CARDIOMYOPATHY (10/20/2008), Cerebrovascular disease, unspecified, Closed right acetabular fracture ([place]), COPD (chronic obstructive pulmonary disease) ([place]) (1999), Epidural hematoma ([place]) (03/01/2011), Esophageal reflux, Generalized osteoarthrosis, unspecified site, History of tobacco abuse, Hypertension, HYPERTENSION, BENIGN (10/20/2008), Hyponatremia, Insomnia, Iron deficiency anemia, unspecified, ISCHEMIC COLITIS, HX OF (10/20/2008), Lung nodules (2008, 2014), Prostate cancer ([place]) (2015), S/P mitral valve replacement (02/01/1998), S/P mitral valve replacement with bioprosthetic valve (03/24/2013), Seizures ([place]), Severe mitral regurgitation (02/20/2013), and Unsteady gait. here with:  1.  Seizures 2.  History of stroke  Overall the patient has done well.  He will continue on Keppra and Dilantin.  I will check blood work today.  Patient has not had any strokelike symptoms.  He remains on Coumadin.  He is advised that if his symptoms worsen or he develops new symptoms he should let us know.  He will follow-up in 1 year or sooner if needed.   Ward Givens, MSN, NP-C 10/22/2017, 10:36 AM [facility] [address], [place], [place] [address] [phone]

## 2017-10-23 ENCOUNTER — Other Ambulatory Visit: Payer: Self-pay | Admitting: Adult Health

## 2017-10-23 ENCOUNTER — Other Ambulatory Visit: Payer: Self-pay | Admitting: Cardiology

## 2017-10-23 DIAGNOSIS — G8929 Other chronic pain: Secondary | ICD-10-CM

## 2017-10-23 DIAGNOSIS — M545 Low back pain, unspecified: Secondary | ICD-10-CM

## 2017-10-23 DIAGNOSIS — Z760 Encounter for issue of repeat prescription: Secondary | ICD-10-CM

## 2017-10-23 LAB — COMPREHENSIVE METABOLIC PANEL
ALBUMIN: 4.1 g/dL (ref 3.5–4.8)
ALK PHOS: 88 IU/L (ref 39–117)
ALT: 11 IU/L (ref 0–44)
AST: 15 IU/L (ref 0–40)
Albumin/Globulin Ratio: 2.4 — ABNORMAL HIGH (ref 1.2–2.2)
BILIRUBIN TOTAL: 0.3 mg/dL (ref 0.0–1.2)
BUN / CREAT RATIO: 6 — AB (ref 10–24)
BUN: 6 mg/dL — AB (ref 8–27)
CHLORIDE: 100 mmol/L (ref 96–106)
CO2: 25 mmol/L (ref 20–29)
Calcium: 8.5 mg/dL — ABNORMAL LOW (ref 8.6–10.2)
Creatinine, Ser: 0.93 mg/dL (ref 0.76–1.27)
GFR calc Af Amer: 95 mL/min/{1.73_m2} (ref >59)
GFR calc non Af Amer: 82 mL/min/{1.73_m2} (ref >59)
GLUCOSE: 103 mg/dL — AB (ref 65–99)
Globulin, Total: 1.7 g/dL (ref 1.5–4.5)
Potassium: 4.4 mmol/L (ref 3.5–5.2)
SODIUM: 139 mmol/L (ref 134–144)
Total Protein: 5.8 g/dL — ABNORMAL LOW (ref 6.0–8.5)

## 2017-10-23 LAB — CBC WITH DIFFERENTIAL/PLATELET
BASOS ABS: 0 10*3/uL (ref 0.0–0.2)
Basos: 1 %
EOS (ABSOLUTE): 0.1 10*3/uL (ref 0.0–0.4)
Eos: 3 %
Hematocrit: 36.3 % — ABNORMAL LOW (ref 37.5–51.0)
Hemoglobin: 12.2 g/dL — ABNORMAL LOW (ref 13.0–17.7)
Immature Grans (Abs): 0 10*3/uL (ref 0.0–0.1)
Immature Granulocytes: 0 %
LYMPHS ABS: 0.6 10*3/uL — AB (ref 0.7–3.1)
Lymphs: 16 %
MCH: 32.5 pg (ref 26.6–33.0)
MCHC: 33.6 g/dL (ref 31.5–35.7)
MCV: 97 fL (ref 79–97)
MONOCYTES: 16 %
MONOS ABS: 0.6 10*3/uL (ref 0.1–0.9)
Neutrophils Absolute: 2.5 10*3/uL (ref 1.4–7.0)
Neutrophils: 64 %
PLATELETS: 199 10*3/uL (ref 150–379)
RBC: 3.75 x10E6/uL — AB (ref 4.14–5.80)
RDW: 13.6 % (ref 12.3–15.4)
WBC: 3.9 10*3/uL (ref 3.4–10.8)

## 2017-10-23 LAB — PHENYTOIN LEVEL, TOTAL: Phenytoin (Dilantin), Serum: 7.6 ug/mL — ABNORMAL LOW (ref 10.0–20.0)

## 2017-10-23 NOTE — Progress Notes (Signed)
I agree with the above plan 

## 2017-10-23 NOTE — Telephone Encounter (Signed)
Copied from [place]. Topic: Quick Communication - Rx Refill/Question >> Oct 23, 2017 11:52 AM Oliver Pila B wrote: Medication: traMADol (ULTRAM) 50 MG tablet [[id number]]  Has the patient contacted their pharmacy? Yes.   (Agent: If no, request that the patient contact the pharmacy for the refill.) Preferred Pharmacy (with phone number or street name): [facility] Agent: Please be advised that RX refills may take up to 3 business days. We ask that you follow-up with your pharmacy.

## 2017-10-23 NOTE — Telephone Encounter (Signed)
Refill of tramadol  LOV 03/15/17  C[address] [place], [place]

## 2017-10-24 ENCOUNTER — Telehealth: Payer: Self-pay | Admitting: *Deleted

## 2017-10-24 MED ORDER — TRAMADOL HCL 50 MG PO TABS: ORAL_TABLET | ORAL | 1 refills | Status: DC

## 2017-10-24 NOTE — Telephone Encounter (Signed)
LVM informing patient his blood work is relatively unremarkable and consistent with his previous blood work. Advised a  copy will be forwarded to his primary care provider. Advised his Dilantin level is low, but he's had no seizure events. The NP will continue to monitor that.  Left number for any questions.

## 2017-10-28 ENCOUNTER — Other Ambulatory Visit: Payer: Self-pay | Admitting: *Deleted

## 2017-10-28 MED ORDER — WARFARIN SODIUM 6 MG PO TABS: ORAL_TABLET | ORAL | 0 refills | Status: DC

## 2017-10-28 NOTE — Telephone Encounter (Signed)
Pt called & requested a Warfarin refill to [facility].

## 2017-11-13 ENCOUNTER — Ambulatory Visit (INDEPENDENT_AMBULATORY_CARE_PROVIDER_SITE_OTHER): Payer: Medicare HMO | Admitting: *Deleted

## 2017-11-13 DIAGNOSIS — Z952 Presence of prosthetic heart valve: Secondary | ICD-10-CM

## 2017-11-13 DIAGNOSIS — Z953 Presence of xenogenic heart valve: Secondary | ICD-10-CM | POA: Diagnosis not present

## 2017-11-13 DIAGNOSIS — I4891 Unspecified atrial fibrillation: Secondary | ICD-10-CM | POA: Diagnosis not present

## 2017-11-13 DIAGNOSIS — Z5181 Encounter for therapeutic drug level monitoring: Secondary | ICD-10-CM | POA: Diagnosis not present

## 2017-11-13 LAB — POCT INR: INR: 2.1

## 2017-11-13 NOTE — Patient Instructions (Signed)
Description   Today April 17th take 1 tablet then continue taking 1/2 tablet daily except 1 tablet on Sundays, Tuesdays and Thursdays.  Recheck INR in 2 weeks.

## 2017-11-25 DIAGNOSIS — Z8546 Personal history of malignant neoplasm of prostate: Secondary | ICD-10-CM | POA: Diagnosis not present

## 2017-11-25 DIAGNOSIS — R350 Frequency of micturition: Secondary | ICD-10-CM | POA: Diagnosis not present

## 2017-11-27 ENCOUNTER — Ambulatory Visit (INDEPENDENT_AMBULATORY_CARE_PROVIDER_SITE_OTHER): Payer: Medicare HMO | Admitting: *Deleted

## 2017-11-27 DIAGNOSIS — Z952 Presence of prosthetic heart valve: Secondary | ICD-10-CM | POA: Diagnosis not present

## 2017-11-27 DIAGNOSIS — I679 Cerebrovascular disease, unspecified: Secondary | ICD-10-CM | POA: Diagnosis not present

## 2017-11-27 DIAGNOSIS — Z5181 Encounter for therapeutic drug level monitoring: Secondary | ICD-10-CM

## 2017-11-27 DIAGNOSIS — I4891 Unspecified atrial fibrillation: Secondary | ICD-10-CM | POA: Diagnosis not present

## 2017-11-27 DIAGNOSIS — Z953 Presence of xenogenic heart valve: Secondary | ICD-10-CM

## 2017-11-27 LAB — POCT INR: INR: 3.4

## 2017-11-27 NOTE — Patient Instructions (Signed)
Description   Continue taking 1/2 tablet daily except 1 tablet on Sundays, Tuesdays and Thursdays.  Recheck INR in 3 weeks. Pt will be out of town but will return for appt on May 28th

## 2017-12-24 ENCOUNTER — Ambulatory Visit (INDEPENDENT_AMBULATORY_CARE_PROVIDER_SITE_OTHER): Payer: Medicare HMO

## 2017-12-24 DIAGNOSIS — Z5181 Encounter for therapeutic drug level monitoring: Secondary | ICD-10-CM | POA: Diagnosis not present

## 2017-12-24 DIAGNOSIS — I4891 Unspecified atrial fibrillation: Secondary | ICD-10-CM | POA: Diagnosis not present

## 2017-12-24 LAB — POCT INR: INR: 3.1 — AB (ref 2.0–3.0)

## 2017-12-24 NOTE — Patient Instructions (Signed)
Description   Continue taking 1/2 tablet daily except 1 tablet on Sundays, Tuesdays and Thursdays.  Recheck INR in 4 weeks.

## 2017-12-26 ENCOUNTER — Other Ambulatory Visit: Payer: Self-pay | Admitting: Adult Health

## 2017-12-26 DIAGNOSIS — M545 Low back pain, unspecified: Secondary | ICD-10-CM

## 2017-12-26 DIAGNOSIS — G8929 Other chronic pain: Secondary | ICD-10-CM

## 2017-12-26 DIAGNOSIS — Z760 Encounter for issue of repeat prescription: Secondary | ICD-10-CM

## 2017-12-26 MED ORDER — TRAMADOL HCL 50 MG PO TABS
ORAL_TABLET | ORAL | 1 refills | Status: DC
Start: 2017-12-26 — End: 2018-02-24

## 2017-12-26 NOTE — Telephone Encounter (Signed)
Tramadol refill Last Refill:10/24/17 #90 Last OV: 03/15/17 PCP: Janna Arch Pharmacy:[facility]

## 2017-12-26 NOTE — Telephone Encounter (Signed)
Copied from [place] [id number]. Topic: Quick Communication - See Telephone Encounter >> Dec 26, 2017 10:03 AM Ivar Drape wrote: CRM for notification. See Telephone encounter for: 12/26/17. Patient would like a refill on his traMADol (ULTRAM) 50 MG tablet medication and have it sent to his preferred pharmacy [facility] mail order.

## 2017-12-31 ENCOUNTER — Other Ambulatory Visit: Payer: Self-pay | Admitting: Cardiology

## 2018-01-14 ENCOUNTER — Other Ambulatory Visit: Payer: Self-pay | Admitting: Adult Health

## 2018-01-14 DIAGNOSIS — Z760 Encounter for issue of repeat prescription: Secondary | ICD-10-CM

## 2018-01-20 ENCOUNTER — Other Ambulatory Visit: Payer: Self-pay | Admitting: Adult Health

## 2018-01-20 NOTE — Telephone Encounter (Signed)
FILLED ON 03/05/2017 FOR 1 YEAR.  REQUEST FOR REFILL IS TOO SOON.

## 2018-01-21 ENCOUNTER — Ambulatory Visit (INDEPENDENT_AMBULATORY_CARE_PROVIDER_SITE_OTHER): Payer: Medicare HMO | Admitting: *Deleted

## 2018-01-21 DIAGNOSIS — Z5181 Encounter for therapeutic drug level monitoring: Secondary | ICD-10-CM

## 2018-01-21 DIAGNOSIS — I4891 Unspecified atrial fibrillation: Secondary | ICD-10-CM

## 2018-01-21 LAB — POCT INR
INR: 2.3 (ref 2.0–3.0)
INR: 2.3 (ref 2.0–3.0)

## 2018-01-21 NOTE — Patient Instructions (Signed)
Description   Today take 1.5 tablets, then Continue taking 1/2 tablet daily except 1 tablet on Sundays, Tuesdays and Thursdays.  Recheck INR in 3 weeks.

## 2018-02-11 ENCOUNTER — Ambulatory Visit (INDEPENDENT_AMBULATORY_CARE_PROVIDER_SITE_OTHER): Payer: Medicare HMO | Admitting: *Deleted

## 2018-02-11 DIAGNOSIS — I4891 Unspecified atrial fibrillation: Secondary | ICD-10-CM | POA: Diagnosis not present

## 2018-02-11 DIAGNOSIS — Z5181 Encounter for therapeutic drug level monitoring: Secondary | ICD-10-CM

## 2018-02-11 LAB — POCT INR: INR: 2.5 (ref 2.0–3.0)

## 2018-02-11 NOTE — Patient Instructions (Signed)
Description   Today take 1.5 tablets, then continue taking 1/2 tablet daily except 1 tablet on Sundays, Tuesdays and Thursdays.  Recheck INR in 4 weeks.

## 2018-02-24 ENCOUNTER — Other Ambulatory Visit: Payer: Self-pay | Admitting: Adult Health

## 2018-02-24 DIAGNOSIS — G8929 Other chronic pain: Secondary | ICD-10-CM

## 2018-02-24 DIAGNOSIS — M545 Low back pain, unspecified: Secondary | ICD-10-CM

## 2018-02-24 DIAGNOSIS — Z760 Encounter for issue of repeat prescription: Secondary | ICD-10-CM

## 2018-02-26 NOTE — Telephone Encounter (Signed)
Tommi Rumps, please send to the pharmacy if appropriate.

## 2018-02-27 NOTE — Progress Notes (Signed)
Cardiology Office Note   Date:  02/28/2018   ID:  Jeffery Copeland February 16, 1946, MRN [id number]  PCP:  Jeffery Peng, NP  Cardiologist:  Was Dr. [name]  Will change to Dr. Marlou Porch.    Chief Complaint  Patient presents with  . Atrial Fibrillation    hx MVR with bioprosthetic 2014.      History of Present Illness: Jeffery Copeland is a 72 y.o. male who presents for permanent atrial fib.  He is on coumadin followed by our office.  He has a history of endocarditis necessitating bioprosthetic mitral valve replacement in 1999 with redo bioprosthetic mitral valve in 8/14 for degeneration of the valve with severe MR as well as chronic atrial fibrillation presents for cardiology followup.  He seems to have done well since his redo MVR.  Echo in 4/16 showed EF 50-55% with normal bioprosthetic mitral valve. Hx of seizures. Last seen 07/2015 A fib on coumadin which we follow.   Today he has no complaints, no chest pain and no SOB.  He ambulates with a cane.  He has some pink blood in stools when he takes a whole tablet of coumadin, though last CBC was stable.  He has not had recent seizures.  His COPD is stable and followed by Pulmonary.  He has not had lipids since 2018 will recheck today. He also has hx of carotid stenosis with 40-59% stenosis on Rt.  2015, will need to be rechecked.     Past Medical History:  Diagnosis Date  . Acute and subacute bacterial endocarditis 1999   group G Streptococcus  . Atrial fibrillation ([place]) 11/11/2008   Qualifier: Diagnosis of  By: Jeffery Foyer, MD, Jeffery Copeland   . CARDIOMYOPATHY 10/20/2008   Qualifier: Diagnosis of  By: Jeffery Shark, RN, BSN, Jeffery Copeland    . Cerebrovascular disease, unspecified   . Closed right acetabular fracture ([place])   . COPD (chronic obstructive pulmonary disease) ([place]) 1999  . Epidural hematoma ([place]) 03/01/2011   Recent fall 2012   . Esophageal reflux   . Generalized osteoarthrosis, unspecified site   . History of tobacco abuse   .  Hypertension   . HYPERTENSION, BENIGN 10/20/2008   Qualifier: Diagnosis of  By: Jeffery Shark, RN, BSN, Jeffery Copeland    . Hyponatremia   . Insomnia   . Iron deficiency anemia, unspecified   . ISCHEMIC COLITIS, HX OF 10/20/2008   Qualifier: Diagnosis of  By: Jeffery Shark, RN, BSN, Jeffery Copeland    . Lung nodules 2008, 2014   LLL nodule removed 2008 with LLL lobectomy,  RLL nodule 58mm observation  . Prostate cancer ([place]) 2015  . S/P mitral valve replacement 02/01/1998   Carpentier-Edwards porcine bioprosthetic tissue valve, size 20mm Placed for acute and subacute bacterial endocarditis (group G Streptococcus) with pre-existing mitral valve prolapse   . S/P mitral valve replacement with bioprosthetic valve 03/24/2013   Redo mitral valve replacement using 48mm Edwards [facility] mitral bovine bioprosthetic tissue valve performed via right mini thoracotomy  . Seizures (Jeffery Copeland)    last seizure 1 year ago  . Severe mitral regurgitation 02/20/2013  . Unsteady gait     Past Surgical History:  Procedure Laterality Date  . CARDIAC VALVE REPLACEMENT    . INTRAOPERATIVE TRANSESOPHAGEAL ECHOCARDIOGRAM N/A 03/24/2013   Procedure: INTRAOPERATIVE TRANSESOPHAGEAL ECHOCARDIOGRAM;  Surgeon: Jeffery Alberts, MD;  Location: [facility];  Service: Open Heart Surgery;  Laterality: N/A;  . MINIMALLY INVASIVE MAZE PROCEDURE N/A 03/24/2013   Procedure: MINIMALLY INVASIVE MAZE PROCEDURE;  Surgeon: Jeffery Copeland  Jeffery Barre, MD;  Location: [facility];  Service: Open Heart Surgery;  Laterality: N/A;  . MITRAL VALVE REPLACEMENT  02/01/1998   Carpentier-Edwards porcine bioprosthetic tissue valve, size 64mm, placed for complicated bacterial endocarditis  . MITRAL VALVE REPLACEMENT Right 03/24/2013   Procedure: MINIMALLY INVASIVE REDO MITRAL VALVE (MV) REPLACEMENT;  Surgeon: Jeffery Alberts, MD;  Location: [place];  Service: Open Heart Surgery;  Laterality: Right;  . PROSTATE BIOPSY    . radiation treatment tx for prostate cancer  2015  . TEE WITHOUT CARDIOVERSION N/A 02/12/2013    Procedure: TRANSESOPHAGEAL ECHOCARDIOGRAM (TEE);  Surgeon: Jeffery Records, MD;  Location: [facility];  Service: Cardiovascular;  Laterality: N/A;  . VIDEO ASSISTED THORACOSCOPY  04/29/2007   Left VATS w/ mini thoracotomy for Left Lower Lobectomy for benign lung nodules     Current Outpatient Medications  Medication Sig Dispense Refill  . Cholecalciferol (VITAMIN D3) 1000 UNITS CAPS Take 1,000 Units by mouth daily.     Marland Kitchen ezetimibe (ZETIA) 10 MG tablet Take 1 tablet (10 mg total) by mouth daily. 90 tablet 3  . levETIRAcetam (KEPPRA) 500 MG tablet Take 1 tablet (500 mg total) by mouth 2 (two) times daily. 180 tablet 3  . metoprolol tartrate (LOPRESSOR) 25 MG tablet Take 0.5 tablets (12.5 mg total) by mouth 2 (two) times daily. 90 tablet 3  . phenytoin (DILANTIN) 100 MG ER capsule TAKE 3 CAPSULES (300MG ) AT BEDTIME 270 capsule 3  . temazepam (RESTORIL) 15 MG capsule Take 15 mg by mouth at bedtime as needed. sleep  3  . tiotropium (SPIRIVA HANDIHALER) 18 MCG inhalation capsule INHALE THE CONTENTS OF 1 CAPSULE EVERY DAY VIA HANDIHALER 90 capsule 3  . tiZANidine (ZANAFLEX) 4 MG tablet TAKE 1 TABLET EVERY 6 HOURS AS NEEDED FOR MUSCLE SPASM(S) 360 tablet 1  . traMADol (ULTRAM) 50 MG tablet TAKE 1 TABLET EVERY 8 HOURS IF NEEDED 90 tablet 1  . warfarin (COUMADIN) 6 MG tablet TAKE AS DIRECTED BY COUMADIN CLINIC 80 tablet 0   No current facility-administered medications for this visit.     Allergies:   Ace inhibitors and Rosuvastatin    Social History:  The patient  reports that he quit smoking about 11 years ago. His smoking use included cigarettes. He has a 90.00 pack-year smoking history. He quit smokeless tobacco use about 10 years ago. He reports that he does not drink alcohol or use drugs.   Family History:  The patient's family history includes Arthritis in his mother; Cancer in his brother and father; Heart attack in his father; Tuberculosis in his father.    ROS:  General:no colds or  fevers, + weight loss13 lbs since sept 2018. Skin:no rashes or ulcers HEENT:no blurred vision, no congestion CV:see HPI PUL:see HPI GI:no diarrhea constipation mild melena, no indigestion GU:no hematuria, no dysuria MS:no joint pain, no claudication Neuro:no syncope, no lightheadedness, no recent seizures. Endo:no diabetes, no thyroid disease  Wt Readings from Last 3 Encounters:  02/28/18 130 lb 6.4 oz (59.1 kg)  10/22/17 138 lb 6.4 oz (62.8 kg)  04/24/17 143 lb (64.9 kg)     PHYSICAL EXAM: VS:  BP (!) 148/84   Pulse 81   Ht 5' 4.5" (1.638 m)   Wt 130 lb 6.4 oz (59.1 kg)   SpO2 95%   BMI 22.04 kg/m  , BMI Body mass index is 22.04 kg/m. General:Pleasant affect, NAD Skin:Warm and dry, brisk capillary refill HEENT:normocephalic, sclera clear, mucus membranes moist, Hard of hearing Neck:supple, no  JVD, no bruits  Chest wall Pectus carinatum Heart:irreg irreg with 1/6 systolic murmur, no gallup, rub or click Lungs:clear without rales, rhonchi, or wheezes QBV:QXIH, non tender, + BS, do not palpate liver spleen or masses Ext:no lower ext edema, 2+ radial pulses Neuro:alert and oriented X 3, MAE, follows commands, + facial symmetry    EKG:  EKG is ordered today. The ekg ordered today demonstrates a fib rate controlled non specific ST and T wave abnormality no acute changes.    Recent Labs: 03/15/2017: TSH 1.26 10/22/2017: ALT 11; BUN 6; Creatinine, Ser 0.93; Hemoglobin 12.2; Platelets 199; Potassium 4.4; Sodium 139    Lipid Panel    Component Value Date/Time   CHOL 119 03/15/2017 1056   TRIG 59.0 03/15/2017 1056   HDL 48.90 03/15/2017 1056   CHOLHDL 2 03/15/2017 1056   VLDL 11.8 03/15/2017 1056   LDLCALC 58 03/15/2017 1056       Other studies Reviewed: Additional studies/ Copeland that were reviewed today include: . Echo 11/08/14 Study Conclusions  - Left ventricle: The cavity size was normal. Wall thickness was increased in a pattern of mild LVH. Systolic  function was normal. The estimated ejection fraction was in the range of 50% to 55%. Wall motion was normal; there were no regional wall motion abnormalities. Doppler parameters are consistent with high ventricular filling pressure. - Aortic valve: There was trivial regurgitation. - Aortic root: The aortic root was mildly dilated. - Mitral valve: A bioprosthesis was present. - Left atrium: The atrium was moderately dilated. - Pulmonary arteries: Systolic pressure was mildly increased. PA peak pressure: 41 mm Hg (S).  Impressions:  - Normal LV function; grade 1 diastolic dysfunction; moderate LAE; s/p MVR; trace MR and AI.  Carotid ultrasound 02/21/14   40-59% RICA, repeat study 1 year.  ASSESSMENT AND PLAN:  1.  MVR initially in 1999, for endocarditis and redo in 2014 with bioprosthetic valve, stable on echo in 2016. No SOB will recheck Echo.  2.  Permanent atrial fib.   On coumadin.  Rate controlled.  3.   Chronic diastolic HF euvolemic  4.   COPD per Pulmonary stable  5.    HLD on zetia will check lipids today  6.   Carotid disease will recheck carotid dopplers.   7.  Seizures followed by Neuro. Stable.      Current medicines are reviewed with the patient today.  The patient Has no concerns regarding medicines.  The following changes have been made:  See above Labs/ tests ordered today include:see above  Disposition:   FU:  see above  Signed, Cecilie Kicks, NP  02/28/2018 9:44 AM    [facility] [address], [place], [place], [place] Phone: [phone]; Fax: [phone]

## 2018-02-28 ENCOUNTER — Ambulatory Visit (INDEPENDENT_AMBULATORY_CARE_PROVIDER_SITE_OTHER): Payer: Medicare HMO | Admitting: Cardiology

## 2018-02-28 ENCOUNTER — Encounter: Payer: Self-pay | Admitting: Cardiology

## 2018-02-28 VITALS — BP 148/84 | HR 81 | Ht 64.5 in | Wt 130.4 lb

## 2018-02-28 DIAGNOSIS — Z952 Presence of prosthetic heart valve: Secondary | ICD-10-CM | POA: Diagnosis not present

## 2018-02-28 DIAGNOSIS — Z9889 Other specified postprocedural states: Secondary | ICD-10-CM

## 2018-02-28 DIAGNOSIS — Z7901 Long term (current) use of anticoagulants: Secondary | ICD-10-CM

## 2018-02-28 DIAGNOSIS — E782 Mixed hyperlipidemia: Secondary | ICD-10-CM | POA: Diagnosis not present

## 2018-02-28 DIAGNOSIS — I4891 Unspecified atrial fibrillation: Secondary | ICD-10-CM

## 2018-02-28 DIAGNOSIS — I6521 Occlusion and stenosis of right carotid artery: Secondary | ICD-10-CM

## 2018-02-28 LAB — LIPID PANEL
Chol/HDL Ratio: 1.9 ratio (ref 0.0–5.0)
Cholesterol, Total: 133 mg/dL (ref 100–199)
HDL: 70 mg/dL (ref >39)
LDL Calculated: 51 mg/dL (ref 0–99)
Triglycerides: 60 mg/dL (ref 0–149)
VLDL Cholesterol Cal: 12 mg/dL (ref 5–40)

## 2018-02-28 LAB — HEPATIC FUNCTION PANEL
ALT: 16 IU/L (ref 0–44)
AST: 17 IU/L (ref 0–40)
Albumin: 4.3 g/dL (ref 3.5–4.8)
Alkaline Phosphatase: 104 IU/L (ref 39–117)
BILIRUBIN TOTAL: 0.4 mg/dL (ref 0.0–1.2)
BILIRUBIN, DIRECT: 0.15 mg/dL (ref 0.00–0.40)
TOTAL PROTEIN: 6.9 g/dL (ref 6.0–8.5)

## 2018-02-28 NOTE — Patient Instructions (Addendum)
Medication Instructions:  Your physician recommends that you continue on your current medications as directed. Please refer to the Current Medication list given to you today.   Labwork: TODAY: LIPIDS, LFTS  Testing/Procedures: Your physician has requested that you have a carotid duplex. This test is an ultrasound of the carotid arteries in your neck. It looks at blood flow through these arteries that supply the brain with blood. Allow one hour for this exam. There are no restrictions or special instructions.  Your physician has requested that you have an echocardiogram. Echocardiography is a painless test that uses sound waves to create images of your heart. It provides your doctor with information about the size and shape of your heart and how well your heart's chambers and valves are working. This procedure takes approximately one hour. There are no restrictions for this procedure.     Follow-Up: Your physician wants you to follow-up in: 1 year with Dr. Marlou Porch. You will receive a reminder letter in the mail two months in advance. If you don't receive a letter, please call our office to schedule the follow-up appointment.   Any Other Special Instructions Will Be Listed Below (If Applicable).     If you need a refill on your cardiac medications before your next appointment, please call your pharmacy.

## 2018-03-03 ENCOUNTER — Other Ambulatory Visit: Payer: Self-pay | Admitting: Cardiology

## 2018-03-03 DIAGNOSIS — I6521 Occlusion and stenosis of right carotid artery: Secondary | ICD-10-CM

## 2018-03-04 NOTE — Progress Notes (Signed)
Pt has been made aware of normal result and verbalized understanding.  jw 03/04/18

## 2018-03-05 ENCOUNTER — Other Ambulatory Visit: Payer: Self-pay | Admitting: Cardiology

## 2018-03-07 ENCOUNTER — Ambulatory Visit (HOSPITAL_COMMUNITY)
Admission: RE | Admit: 2018-03-07 | Discharge: 2018-03-07 | Disposition: A | Discharge status: Home / Self Care | Payer: Medicare HMO | Source: Ambulatory Visit | Attending: Cardiology | Admitting: Cardiology

## 2018-03-07 ENCOUNTER — Encounter (HOSPITAL_COMMUNITY): Payer: Medicare HMO

## 2018-03-07 ENCOUNTER — Other Ambulatory Visit: Payer: Self-pay

## 2018-03-07 ENCOUNTER — Ambulatory Visit (HOSPITAL_COMMUNITY): Payer: Medicare HMO | Attending: Internal Medicine

## 2018-03-07 DIAGNOSIS — Z953 Presence of xenogenic heart valve: Secondary | ICD-10-CM | POA: Insufficient documentation

## 2018-03-07 DIAGNOSIS — I272 Pulmonary hypertension, unspecified: Secondary | ICD-10-CM | POA: Insufficient documentation

## 2018-03-07 DIAGNOSIS — I4891 Unspecified atrial fibrillation: Secondary | ICD-10-CM | POA: Diagnosis not present

## 2018-03-07 DIAGNOSIS — I77810 Thoracic aortic ectasia: Secondary | ICD-10-CM | POA: Insufficient documentation

## 2018-03-07 DIAGNOSIS — I509 Heart failure, unspecified: Secondary | ICD-10-CM | POA: Insufficient documentation

## 2018-03-07 DIAGNOSIS — J449 Chronic obstructive pulmonary disease, unspecified: Secondary | ICD-10-CM | POA: Diagnosis not present

## 2018-03-07 DIAGNOSIS — I110 Hypertensive heart disease with heart failure: Secondary | ICD-10-CM | POA: Diagnosis not present

## 2018-03-07 DIAGNOSIS — Z9889 Other specified postprocedural states: Secondary | ICD-10-CM | POA: Insufficient documentation

## 2018-03-07 DIAGNOSIS — I429 Cardiomyopathy, unspecified: Secondary | ICD-10-CM | POA: Diagnosis not present

## 2018-03-07 DIAGNOSIS — I4892 Unspecified atrial flutter: Secondary | ICD-10-CM | POA: Diagnosis not present

## 2018-03-07 DIAGNOSIS — I6529 Occlusion and stenosis of unspecified carotid artery: Secondary | ICD-10-CM | POA: Insufficient documentation

## 2018-03-07 DIAGNOSIS — I6521 Occlusion and stenosis of right carotid artery: Secondary | ICD-10-CM | POA: Diagnosis not present

## 2018-03-11 ENCOUNTER — Ambulatory Visit (INDEPENDENT_AMBULATORY_CARE_PROVIDER_SITE_OTHER): Payer: Medicare HMO

## 2018-03-11 ENCOUNTER — Other Ambulatory Visit: Payer: Self-pay | Admitting: Adult Health

## 2018-03-11 DIAGNOSIS — Z5181 Encounter for therapeutic drug level monitoring: Secondary | ICD-10-CM

## 2018-03-11 DIAGNOSIS — I4891 Unspecified atrial fibrillation: Secondary | ICD-10-CM | POA: Diagnosis not present

## 2018-03-11 LAB — POCT INR: INR: 2.6 (ref 2.0–3.0)

## 2018-03-11 NOTE — Patient Instructions (Signed)
Please continue taking 1/2 tablet daily except 1 tablet on Sundays, Tuesdays and Thursdays.  Recheck INR in 4 weeks.

## 2018-03-12 ENCOUNTER — Telehealth: Payer: Self-pay

## 2018-03-12 DIAGNOSIS — I6521 Occlusion and stenosis of right carotid artery: Secondary | ICD-10-CM

## 2018-03-12 NOTE — Telephone Encounter (Signed)
-----   Message from Charlie Pitter, [place] sent at 03/12/2018 10:02 AM EDT ----- I am covering Laura's inbox 8/14-8/15. Please let patient know echo showed normal EF and normal prosthetic valve function. There is some progression of pressure elevation in lungs (pulmonary HTN) - was mild by echo 2016. Might be related to underlying lung disease. I will leave this in Laura's inbox for her review to decide if any further intervention necessary. Dayna Dunn PA-C

## 2018-03-12 NOTE — Telephone Encounter (Signed)
Notes recorded by Frederik Schmidt, RN on 03/12/2018 at 10:32 AM EDT LPMTCB 8/14 ------

## 2018-03-17 NOTE — Telephone Encounter (Signed)
-----   Message from [place], [place] sent at 03/07/2018  5:54 PM EDT ----- Covering basket for laura ingold, np. Carotid artery dopplers show mild-moderate plaque in the carotid arteries, R>L, but no significant reduction of blood flow. Recommendation is for repeat study in 12 months. Continue medical management to lower risk for disease progression. He had a recent lipid panel that showed well controlled LDL w/ zeitia in the 50s which is great. We recommend keeping LDL <70 to keep plaque from worsening.

## 2018-03-19 ENCOUNTER — Other Ambulatory Visit: Payer: Self-pay | Admitting: Adult Health

## 2018-03-25 ENCOUNTER — Encounter: Payer: Self-pay | Admitting: Adult Health

## 2018-03-25 ENCOUNTER — Ambulatory Visit (INDEPENDENT_AMBULATORY_CARE_PROVIDER_SITE_OTHER): Payer: Medicare HMO

## 2018-03-25 ENCOUNTER — Ambulatory Visit (INDEPENDENT_AMBULATORY_CARE_PROVIDER_SITE_OTHER): Payer: Medicare HMO | Admitting: Adult Health

## 2018-03-25 VITALS — BP 138/78 | Temp 98.4°F | Ht 64.75 in | Wt 134.0 lb

## 2018-03-25 DIAGNOSIS — I10 Essential (primary) hypertension: Secondary | ICD-10-CM | POA: Diagnosis not present

## 2018-03-25 DIAGNOSIS — C61 Malignant neoplasm of prostate: Secondary | ICD-10-CM

## 2018-03-25 DIAGNOSIS — M954 Acquired deformity of chest and rib: Secondary | ICD-10-CM

## 2018-03-25 DIAGNOSIS — Z0000 Encounter for general adult medical examination without abnormal findings: Secondary | ICD-10-CM

## 2018-03-25 DIAGNOSIS — M898X9 Other specified disorders of bone, unspecified site: Secondary | ICD-10-CM | POA: Diagnosis not present

## 2018-03-25 DIAGNOSIS — J438 Other emphysema: Secondary | ICD-10-CM | POA: Diagnosis not present

## 2018-03-25 DIAGNOSIS — Z0001 Encounter for general adult medical examination with abnormal findings: Secondary | ICD-10-CM | POA: Diagnosis not present

## 2018-03-25 DIAGNOSIS — R569 Unspecified convulsions: Secondary | ICD-10-CM | POA: Diagnosis not present

## 2018-03-25 DIAGNOSIS — E782 Mixed hyperlipidemia: Secondary | ICD-10-CM | POA: Diagnosis not present

## 2018-03-25 DIAGNOSIS — Z23 Encounter for immunization: Secondary | ICD-10-CM

## 2018-03-25 DIAGNOSIS — R0602 Shortness of breath: Secondary | ICD-10-CM | POA: Diagnosis not present

## 2018-03-25 DIAGNOSIS — Z1211 Encounter for screening for malignant neoplasm of colon: Secondary | ICD-10-CM | POA: Diagnosis not present

## 2018-03-25 NOTE — Progress Notes (Signed)
Subjective:    Patient ID: Jeffery Copeland, male    DOB: 06/20/1946, 72 y.o.   MRN: [id number]  HPI Patient presents for yearly preventative medicine examination. He is a pleasant 72 year old male who  has a past medical history of Acute and subacute bacterial endocarditis (1999), Atrial fibrillation ([place]) (11/11/2008), CARDIOMYOPATHY (10/20/2008), Cerebrovascular disease, unspecified, Closed right acetabular fracture ([place]), COPD (chronic obstructive pulmonary disease) ([place]) (1999), Epidural hematoma ([place]) (03/01/2011), Esophageal reflux, Generalized osteoarthrosis, unspecified site, History of tobacco abuse, Hypertension, HYPERTENSION, BENIGN (10/20/2008), Hyponatremia, Insomnia, Iron deficiency anemia, unspecified, ISCHEMIC COLITIS, HX OF (10/20/2008), Lung nodules (2008, 2014), Prostate cancer ([place]) (2015), S/P mitral valve replacement (02/01/1998), S/P mitral valve replacement with bioprosthetic valve (03/24/2013), Seizures ([place]), Severe mitral regurgitation (02/20/2013), and Unsteady gait.  COPD with emphysema and lung nodule - is followed by pulmonary - stable   Seizure disorder - managed with Keppra 500mg  BID and Dilantin 300mg  daily. Is seen by Dr. Leonie Man with neurology - stable   A fib/Mitral Valve Replacement - Managed with coumadin therapy. He will be switching to Dr. Marlou Porch. Most recent carotid artery doppler showed mild - moderate plaque in the carotid arteries, R.L, but no significant reduction of blood flow.   Hyperlipidemia - Managed with zetia.  Lab Results  Component Value Date   CHOL 133 02/28/2018   HDL 70 02/28/2018   LDLCALC 51 02/28/2018   TRIG 60 02/28/2018   CHOLHDL 1.9 02/28/2018   Essential Hypertension - currently prescribed Metoprolol 12.5 mg BID.  BP Readings from Last 3 Encounters:  03/25/18 (!) 150/70  02/28/18 (!) 148/84  10/22/17 130/80   History of Prostate Cancer - is seen by Urology . Has an upcoming appointment.   Insomnia - takes Restoril 15 mg PRN    Chronic back pain - is well controlled on Tramadol   All immunizations and health maintenance protocols were reviewed with the patient and needed orders were placed. UTD on vaccinations   Appropriate screening laboratory values were ordered for the patient including screening of hyperlipidemia, renal function and hepatic function. If indicated by BPH, a PSA was ordered.  Medication reconciliation,  past medical history, social history, problem list and allergies were reviewed in detail with the patient  Goals were established with regard to weight loss, exercise, and  diet in compliance with medications  End of life planning was discussed.  He does not participate in routine dental or vision screens. He is to follow up with Dr. Collene Mares for his colonoscopy.   Review of Systems  Constitutional: Negative.   HENT: Positive for hearing loss.   Eyes: Negative.   Respiratory: Negative.   Cardiovascular: Negative.   Gastrointestinal: Negative.   Endocrine: Negative.   Genitourinary: Negative.   Musculoskeletal: Positive for arthralgias, back pain and gait problem.  Skin: Negative.   Hematological: Negative.   Psychiatric/Behavioral: Negative.    Past Medical History:  Diagnosis Date  . Acute and subacute bacterial endocarditis 1999   group G Streptococcus  . Atrial fibrillation ([place]) 11/11/2008   Qualifier: Diagnosis of  By: Lia Foyer, MD, Jaquelyn Bitter   . CARDIOMYOPATHY 10/20/2008   Qualifier: Diagnosis of  By: Owens Shark, RN, BSN, Lauren    . Cerebrovascular disease, unspecified   . Closed right acetabular fracture ([place])   . COPD (chronic obstructive pulmonary disease) ([place]) 1999  . Epidural hematoma ([place]) 03/01/2011   Recent fall 2012   . Esophageal reflux   . Generalized osteoarthrosis, unspecified site   . History of  tobacco abuse   . Hypertension   . HYPERTENSION, BENIGN 10/20/2008   Qualifier: Diagnosis of  By: Owens Shark, RN, BSN, Lauren    . Hyponatremia   . Insomnia   . Iron  deficiency anemia, unspecified   . ISCHEMIC COLITIS, HX OF 10/20/2008   Qualifier: Diagnosis of  By: Owens Shark, RN, BSN, Lauren    . Lung nodules 2008, 2014   LLL nodule removed 2008 with LLL lobectomy,  RLL nodule 65mm observation  . Prostate cancer ([place]) 2015  . S/P mitral valve replacement 02/01/1998   Carpentier-Edwards porcine bioprosthetic tissue valve, size 46mm Placed for acute and subacute bacterial endocarditis (group G Streptococcus) with pre-existing mitral valve prolapse   . S/P mitral valve replacement with bioprosthetic valve 03/24/2013   Redo mitral valve replacement using 78mm [facility] mitral bovine bioprosthetic tissue valve performed via right mini thoracotomy  . Seizures ([place])    last seizure 1 year ago  . Severe mitral regurgitation 02/20/2013  . Unsteady gait     Social History   Socioeconomic History  . Marital status: Divorced    Spouse name: Not on file  . Number of children: 2  . Years of education: 9  . Highest education level: Not on file  Occupational History  . Occupation: disabled  Social Needs  . Financial resource strain: Not on file  . Food insecurity:    Worry: Not on file    Inability: Not on file  . Transportation needs:    Medical: Not on file    Non-medical: Not on file  Tobacco Use  . Smoking status: Former Smoker    Packs/day: 2.00    Years: 45.00    Pack years: 90.00    Types: Cigarettes    Last attempt to quit: 07/30/2006    Years since quitting: 11.6  . Smokeless tobacco: Former Systems developer    Quit date: 07/31/2007  Substance and Sexual Activity  . Alcohol use: No    Alcohol/week: 0.0 standard drinks    Comment: previous alcohol use & home-made alcohol consumption  . Drug use: No  . Sexual activity: Not Currently  Lifestyle  . Physical activity:    Days per week: Not on file    Minutes per session: Not on file  . Stress: Not on file  Relationships  . Social connections:    Talks on phone: Not on file    Gets together: Not on file      Attends religious service: Not on file    Active member of club or organization: Not on file    Attends meetings of clubs or organizations: Not on file    Relationship status: Not on file  . Intimate partner violence:    Fear of current or ex partner: Not on file    Emotionally abused: Not on file    Physically abused: Not on file    Forced sexual activity: Not on file  Other Topics Concern  . Not on file  Social History Narrative   Patient is divorced and has 2 children.   Patient is right handed.   Patient has 9 th grade education.    Patient drinks diet sodas.      [facility]:   From [place] originally. Always lived in [place]. 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 Previously traveled to [place], [place], & [place]. No pets currently. Remote pet owl and parakeet exposure. No mold exposure. Enjoys fishing.     Past Surgical History:  Procedure Laterality Date  . CARDIAC VALVE REPLACEMENT    . INTRAOPERATIVE TRANSESOPHAGEAL ECHOCARDIOGRAM N/A 03/24/2013   Procedure: INTRAOPERATIVE TRANSESOPHAGEAL ECHOCARDIOGRAM;  Surgeon: Rexene Alberts, MD;  Location: [facility];  Service: Open Heart Surgery;  Laterality: N/A;  . MINIMALLY INVASIVE MAZE PROCEDURE N/A 03/24/2013   Procedure: MINIMALLY INVASIVE MAZE PROCEDURE;  Surgeon: Rexene Alberts, MD;  Location: [facility];  Service: Open Heart Surgery;  Laterality: N/A;  . MITRAL VALVE REPLACEMENT  02/01/1998   Carpentier-Edwards porcine bioprosthetic tissue valve, size 82mm, placed for complicated bacterial endocarditis  . MITRAL VALVE REPLACEMENT Right 03/24/2013   Procedure: MINIMALLY INVASIVE REDO MITRAL VALVE (MV) REPLACEMENT;  Surgeon: Rexene Alberts, MD;  Location: [facility];  Service: Open Heart Surgery;  Laterality: Right;  . PROSTATE BIOPSY    . radiation treatment tx for prostate cancer  2015  . TEE WITHOUT CARDIOVERSION N/A 02/12/2013   Procedure: TRANSESOPHAGEAL ECHOCARDIOGRAM (TEE);  Surgeon: Fay Records, MD;  Location:  [facility];  Service: Cardiovascular;  Laterality: N/A;  . VIDEO ASSISTED THORACOSCOPY  04/29/2007   Left VATS w/ mini thoracotomy for Left Lower Lobectomy for benign lung nodules    Family History  Problem Relation Age of Onset  . Cancer Father        prostate cancer  . Tuberculosis Father   . Heart attack Father   . Cancer Brother        prostate cancer  . Arthritis Mother   . Lung disease Neg Hx     Allergies  Allergen Reactions  . Ace Inhibitors Swelling  . Rosuvastatin Other (See Comments)    Muscle aches    Current Outpatient Medications on File Prior to Visit  Medication Sig Dispense Refill  . Cholecalciferol (VITAMIN D3) 1000 UNITS CAPS Take 1,000 Units by mouth daily.     Marland Kitchen ezetimibe (ZETIA) 10 MG tablet Take 1 tablet (10 mg total) by mouth daily. 90 tablet 3  . levETIRAcetam (KEPPRA) 500 MG tablet Take 1 tablet (500 mg total) by mouth 2 (two) times daily. 180 tablet 3  . metoprolol tartrate (LOPRESSOR) 25 MG tablet TAKE 1/2 TABLET TWICE DAILY 90 tablet 0  . phenytoin (DILANTIN) 100 MG ER capsule TAKE 3 CAPSULES (300MG ) AT BEDTIME 270 capsule 3  . temazepam (RESTORIL) 15 MG capsule Take 15 mg by mouth at bedtime as needed. sleep  3  . tiotropium (SPIRIVA HANDIHALER) 18 MCG inhalation capsule INHALE THE CONTENTS OF 1 CAPSULE EVERY DAY VIA HANDIHALER 90 capsule 3  . tiZANidine (ZANAFLEX) 4 MG tablet TAKE 1 TABLET EVERY 6 HOURS AS NEEDED FOR MUSCLE SPASM(S) 360 tablet 1  . traMADol (ULTRAM) 50 MG tablet TAKE 1 TABLET EVERY 8 HOURS IF NEEDED 90 tablet 1  . warfarin (COUMADIN) 6 MG tablet TAKE AS DIRECTED BY COUMADIN CLINIC 80 tablet 0   No current facility-administered medications on file prior to visit.     BP 138/78   Temp 98.4 F (36.9 C) (Oral)   Ht 5' 4.75" (1.645 m)   Wt 134 lb (60.8 kg)   BMI 22.47 kg/m       Objective:   Physical Exam  Constitutional: He is oriented to person, place, and time. He appears well-developed and well-nourished. No  distress.  HENT:  Head: Normocephalic and atraumatic.  Right Ear: External ear normal.  Left Ear: External ear normal.  Nose: Nose normal.  Mouth/Throat: Oropharynx is clear and moist. No oropharyngeal exudate.  HOH    Eyes: Pupils are equal, round,  and reactive to light. Conjunctivae and EOM are normal. Right eye exhibits no discharge. Left eye exhibits no discharge. No scleral icterus.  Neck: Normal range of motion. Neck supple. No JVD present. No tracheal deviation present. No thyromegaly present.  Cardiovascular: Normal rate, regular rhythm, normal heart sounds and intact distal pulses. Exam reveals no gallop and no friction rub.  No murmur heard. Surgical scar on chest. Has bony prominence upper mid sternum. Painful to palpation   Pulmonary/Chest: Effort normal and breath sounds normal. No stridor. No respiratory distress. He has no wheezes. He has no rales. He exhibits no tenderness.  Abdominal: Soft. Bowel sounds are normal. He exhibits no distension and no mass. There is no tenderness. There is no rebound and no guarding. No hernia.  Genitourinary:  Genitourinary Comments: Deferred. Done by urology   Musculoskeletal: Normal range of motion. He exhibits no edema, tenderness or deformity.  Slow steady gait with sing prong cane   Lymphadenopathy:    He has no cervical adenopathy.  Neurological: He is alert and oriented to person, place, and time. He displays normal reflexes. No cranial nerve deficit or sensory deficit. He exhibits normal muscle tone. Coordination normal.  Skin: Skin is warm and dry. Capillary refill takes less than 2 seconds. No rash noted. He is not diaphoretic. No erythema. No pallor.  Psychiatric: He has a normal mood and affect. His behavior is normal. Judgment and thought content normal.  Nursing note and vitals reviewed.     Assessment & Plan:  1. Routine general medical examination at a health care facility - Follow up in one year or sooner if needed - CBC  with Differential/Platelet - TSH - Basic Metabolic Panel  2. Need for vaccination with 13-polyvalent pneumococcal conjugate vaccine  - Pneumococcal conjugate vaccine 13-valent  3. Colon cancer screening - Encouraged to follow up with his GI - Dr. Collene Mares   4. HYPERTENSION, BENIGN - He is unsure if he took his medication today  - Will continue to monitor   5. Malignant neoplasm of prostate ([place]) - Follow up with Urology   6. Seizures ([place]) - Continue with POC by Neurology  - No recent seizures  7. Mixed hyperlipidemia - Recent lipid panel done- continue with zetia  Lab Results  Component Value Date   CHOL 133 02/28/2018   HDL 70 02/28/2018   LDLCALC 51 02/28/2018   TRIG 60 02/28/2018   CHOLHDL 1.9 02/28/2018    - CBC with Differential/Platelet - TSH  8. Other emphysema ([place]) - Continue with POC by Pulmonary   9. Bony prominence - DG Chest 2 View; Future - Consider referral to orthopedics or gen Surgery   Dorothyann Peng, NP

## 2018-03-25 NOTE — Patient Instructions (Signed)
It was great seeing you today   I will follow up with you regarding your blood work   Please schedule with Dr. Collene Mares for your colonoscopy   We will also get a chest xray today.   Follow up in one year or sooner if needed

## 2018-03-26 LAB — CBC WITH DIFFERENTIAL/PLATELET
BASOS ABS: 0.1 10*3/uL (ref 0.0–0.1)
Basophils Relative: 1.4 % (ref 0.0–3.0)
EOS ABS: 0.1 10*3/uL (ref 0.0–0.7)
Eosinophils Relative: 2.2 % (ref 0.0–5.0)
HCT: 38.8 % — ABNORMAL LOW (ref 39.0–52.0)
Hemoglobin: 13 g/dL (ref 13.0–17.0)
LYMPHS ABS: 0.8 10*3/uL (ref 0.7–4.0)
Lymphocytes Relative: 13.5 % (ref 12.0–46.0)
MCHC: 33.5 g/dL (ref 30.0–36.0)
MCV: 99.2 fl (ref 78.0–100.0)
MONOS PCT: 17.5 % — AB (ref 3.0–12.0)
Monocytes Absolute: 1 10*3/uL (ref 0.1–1.0)
NEUTROS PCT: 65.4 % (ref 43.0–77.0)
Neutro Abs: 3.8 10*3/uL (ref 1.4–7.7)
PLATELETS: 194 10*3/uL (ref 150.0–400.0)
RBC: 3.91 Mil/uL — AB (ref 4.22–5.81)
RDW: 13.5 % (ref 11.5–15.5)
WBC: 5.9 10*3/uL (ref 4.0–10.5)

## 2018-03-26 LAB — BASIC METABOLIC PANEL
BUN: 13 mg/dL (ref 6–23)
CHLORIDE: 102 meq/L (ref 96–112)
CO2: 28 mEq/L (ref 19–32)
Calcium: 9.2 mg/dL (ref 8.4–10.5)
Creatinine, Ser: 1.19 mg/dL (ref 0.40–1.50)
GFR: 63.84 mL/min (ref >60.00)
Glucose, Bld: 74 mg/dL (ref 70–99)
Potassium: 4.5 mEq/L (ref 3.5–5.1)
SODIUM: 139 meq/L (ref 135–145)

## 2018-03-26 LAB — TSH: TSH: 1.5 u[IU]/mL (ref 0.35–4.50)

## 2018-04-10 ENCOUNTER — Ambulatory Visit (INDEPENDENT_AMBULATORY_CARE_PROVIDER_SITE_OTHER): Payer: Medicare HMO | Admitting: *Deleted

## 2018-04-10 DIAGNOSIS — Z5181 Encounter for therapeutic drug level monitoring: Secondary | ICD-10-CM

## 2018-04-10 DIAGNOSIS — I4891 Unspecified atrial fibrillation: Secondary | ICD-10-CM

## 2018-04-10 LAB — POCT INR: INR: 2.1 (ref 2.0–3.0)

## 2018-04-10 NOTE — Patient Instructions (Signed)
Description   Today take 1.5 tablets, tomorrow take 1 tablet, then continue taking 1/2 tablet daily except 1 tablet on Sundays, Tuesdays and Thursdays.  Recheck INR in 3 weeks.

## 2018-04-15 ENCOUNTER — Other Ambulatory Visit: Payer: Self-pay | Admitting: Adult Health

## 2018-04-15 DIAGNOSIS — M545 Low back pain, unspecified: Secondary | ICD-10-CM

## 2018-04-15 DIAGNOSIS — G8929 Other chronic pain: Secondary | ICD-10-CM

## 2018-04-15 DIAGNOSIS — Z760 Encounter for issue of repeat prescription: Secondary | ICD-10-CM

## 2018-05-05 ENCOUNTER — Ambulatory Visit (INDEPENDENT_AMBULATORY_CARE_PROVIDER_SITE_OTHER): Payer: Medicare HMO

## 2018-05-05 DIAGNOSIS — I4891 Unspecified atrial fibrillation: Secondary | ICD-10-CM

## 2018-05-05 DIAGNOSIS — Z5181 Encounter for therapeutic drug level monitoring: Secondary | ICD-10-CM | POA: Diagnosis not present

## 2018-05-05 LAB — POCT INR: INR: 2.3 (ref 2.0–3.0)

## 2018-05-05 NOTE — Patient Instructions (Signed)
Description   Start taking 1 tablet daily except 1/2 tablet on Mondays, Wednesdays and Fridays.  Recheck INR in 3 weeks.

## 2018-05-14 ENCOUNTER — Other Ambulatory Visit: Payer: Self-pay | Admitting: Adult Health

## 2018-05-22 DIAGNOSIS — Z8546 Personal history of malignant neoplasm of prostate: Secondary | ICD-10-CM | POA: Diagnosis not present

## 2018-05-26 ENCOUNTER — Ambulatory Visit (INDEPENDENT_AMBULATORY_CARE_PROVIDER_SITE_OTHER): Payer: Medicare HMO

## 2018-05-26 DIAGNOSIS — I4891 Unspecified atrial fibrillation: Secondary | ICD-10-CM | POA: Diagnosis not present

## 2018-05-26 DIAGNOSIS — Z5181 Encounter for therapeutic drug level monitoring: Secondary | ICD-10-CM | POA: Diagnosis not present

## 2018-05-26 LAB — POCT INR: INR: 3 (ref 2.0–3.0)

## 2018-06-04 DIAGNOSIS — H26491 Other secondary cataract, right eye: Secondary | ICD-10-CM | POA: Diagnosis not present

## 2018-06-04 DIAGNOSIS — Z961 Presence of intraocular lens: Secondary | ICD-10-CM | POA: Diagnosis not present

## 2018-06-04 DIAGNOSIS — H353131 Nonexudative age-related macular degeneration, bilateral, early dry stage: Secondary | ICD-10-CM | POA: Diagnosis not present

## 2018-06-05 ENCOUNTER — Other Ambulatory Visit: Payer: Self-pay | Admitting: Adult Health

## 2018-06-05 DIAGNOSIS — Z760 Encounter for issue of repeat prescription: Secondary | ICD-10-CM

## 2018-06-10 NOTE — Telephone Encounter (Signed)
Sent to the pharmacy by e-scribe. 

## 2018-06-16 ENCOUNTER — Other Ambulatory Visit: Payer: Self-pay | Admitting: Adult Health

## 2018-06-16 DIAGNOSIS — G8929 Other chronic pain: Secondary | ICD-10-CM

## 2018-06-16 DIAGNOSIS — M545 Low back pain, unspecified: Secondary | ICD-10-CM

## 2018-06-16 DIAGNOSIS — Z760 Encounter for issue of repeat prescription: Secondary | ICD-10-CM

## 2018-06-23 ENCOUNTER — Ambulatory Visit (INDEPENDENT_AMBULATORY_CARE_PROVIDER_SITE_OTHER): Payer: Medicare HMO | Admitting: *Deleted

## 2018-06-23 DIAGNOSIS — I4891 Unspecified atrial fibrillation: Secondary | ICD-10-CM | POA: Diagnosis not present

## 2018-06-23 DIAGNOSIS — Z5181 Encounter for therapeutic drug level monitoring: Secondary | ICD-10-CM

## 2018-06-23 LAB — POCT INR: INR: 3.3 — AB (ref 2.0–3.0)

## 2018-06-23 NOTE — Patient Instructions (Addendum)
  Description   Continue on same dosage 1 tablet daily except 1/2 tablet on Mondays, Wednesdays and Fridays.  Recheck INR in 4 weeks.

## 2018-06-27 ENCOUNTER — Encounter: Payer: Self-pay | Admitting: Family

## 2018-06-27 ENCOUNTER — Ambulatory Visit (INDEPENDENT_AMBULATORY_CARE_PROVIDER_SITE_OTHER): Payer: Medicare HMO | Admitting: Family

## 2018-06-27 VITALS — BP 120/80 | HR 74 | Ht 64.75 in | Wt 139.8 lb

## 2018-06-27 DIAGNOSIS — L989 Disorder of the skin and subcutaneous tissue, unspecified: Secondary | ICD-10-CM | POA: Diagnosis not present

## 2018-06-27 MED ORDER — CEPHALEXIN 500 MG PO CAPS: 500.0000 mg | ORAL_CAPSULE | Freq: Three times a day (TID) | ORAL | 0 refills | Status: DC
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
## 2018-06-27 NOTE — Patient Instructions (Signed)
Please begin keflex (antibiotic) for the skin infection on your chest. Call if increased pain/redness/swelling of drainage.

## 2018-06-30 ENCOUNTER — Telehealth: Payer: Self-pay | Admitting: Adult Health

## 2018-06-30 MED ORDER — LEVETIRACETAM 500 MG PO TABS: 500.0000 mg | ORAL_TABLET | Freq: Two times a day (BID) | ORAL | 3 refills | Status: DC

## 2018-06-30 NOTE — Addendum Note (Signed)
Addended by: Brandon Melnick on: 06/30/2018 01:39 PM   Modules accepted: Orders

## 2018-06-30 NOTE — Telephone Encounter (Signed)
I called and relayed to pt that prescription sent into [facility] Mail order.  He verbalized understanding.

## 2018-06-30 NOTE — Telephone Encounter (Signed)
Pt has called for a refill on his levETIRAcetam (KEPPRA) 500 MG tablet  [facility]  Pt states he has 7 days worth remaining

## 2018-07-01 DIAGNOSIS — Z8546 Personal history of malignant neoplasm of prostate: Secondary | ICD-10-CM | POA: Diagnosis not present

## 2018-07-03 ENCOUNTER — Ambulatory Visit (INDEPENDENT_AMBULATORY_CARE_PROVIDER_SITE_OTHER): Payer: Medicare HMO | Admitting: Adult Health

## 2018-07-03 ENCOUNTER — Encounter: Payer: Self-pay | Admitting: Adult Health

## 2018-07-03 VITALS — BP 142/80 | Temp 98.4°F | Wt 138.0 lb

## 2018-07-03 DIAGNOSIS — T8579XD Infection and inflammatory reaction due to other internal prosthetic devices, implants and grafts, subsequent encounter: Secondary | ICD-10-CM

## 2018-07-03 DIAGNOSIS — Z952 Presence of prosthetic heart valve: Secondary | ICD-10-CM

## 2018-07-03 NOTE — Progress Notes (Signed)
Subjective:    Patient ID: Jeffery Copeland, male    DOB: [birth year], 72 y.o.   MRN: [id number]  HPI 72 year old male who  has a past medical history of Acute and subacute bacterial endocarditis (1999), Atrial fibrillation ([place]) (11/11/2008), CARDIOMYOPATHY (10/20/2008), Cerebrovascular disease, unspecified, Closed right acetabular fracture ([place]), COPD (chronic obstructive pulmonary disease) ([place]) (1999), Epidural hematoma ([place]) (03/01/2011), Esophageal reflux, Generalized osteoarthrosis, unspecified site, History of tobacco abuse, Hypertension, HYPERTENSION, BENIGN (10/20/2008), Hyponatremia, Insomnia, Iron deficiency anemia, unspecified, ISCHEMIC COLITIS, HX OF (10/20/2008), Lung nodules (2008, 2014), Prostate cancer ([place]) (2015), S/P mitral valve replacement (02/01/1998), S/P mitral valve replacement with bioprosthetic valve (03/24/2013), Seizures ([place]), Severe mitral regurgitation (02/20/2013), and Unsteady gait.  He presents to the office today for one week follow up after being seen by another provider with mild skin infection on chest with nodule.Marland Kitchen He was prescribed a course of Keflex and it was thought that the hard nodule was related to sternotomy wire.   Today he reports that he has one more day of antibiotics left but does not feel as though the wound has improved, the wound has now opened and a wire is exposed. He continues to have drainage on band aid from wound.   Denies fevers or chills.    Review of Systems See HPI   Past Medical History:  Diagnosis Date  . Acute and subacute bacterial endocarditis 1999   group G Streptococcus  . Atrial fibrillation ([place]) 11/11/2008   Qualifier: Diagnosis of  By: Lia Foyer, MD, Jaquelyn Bitter   . CARDIOMYOPATHY 10/20/2008   Qualifier: Diagnosis of  By: Owens Shark, RN, BSN, Lauren    . Cerebrovascular disease, unspecified   . Closed right acetabular fracture ([place])   . COPD (chronic obstructive pulmonary disease) ([place]) 1999  . Epidural hematoma ([place])  03/01/2011   Recent fall 2012   . Esophageal reflux   . Generalized osteoarthrosis, unspecified site   . History of tobacco abuse   . Hypertension   . HYPERTENSION, BENIGN 10/20/2008   Qualifier: Diagnosis of  By: Owens Shark, RN, BSN, Lauren    . Hyponatremia   . Insomnia   . Iron deficiency anemia, unspecified   . ISCHEMIC COLITIS, HX OF 10/20/2008   Qualifier: Diagnosis of  By: Owens Shark, RN, BSN, Lauren    . Lung nodules 2008, 2014   LLL nodule removed 2008 with LLL lobectomy,  RLL nodule 30mm observation  . Prostate cancer ([place]) 2015  . S/P mitral valve replacement 02/01/1998   Carpentier-Edwards porcine bioprosthetic tissue valve, size 45mm Placed for acute and subacute bacterial endocarditis (group G Streptococcus) with pre-existing mitral valve prolapse   . S/P mitral valve replacement with bioprosthetic valve 03/24/2013   Redo mitral valve replacement using 45mm Edwards [facility] mitral bovine bioprosthetic tissue valve performed via right mini thoracotomy  . Seizures ([place])    last seizure 1 year ago  . Severe mitral regurgitation 02/20/2013  . Unsteady gait     Social History   Socioeconomic History  . Marital status: Divorced    Spouse name: Not on file  . Number of children: 2  . Years of education: 9  . Highest education level: Not on file  Occupational History  . Occupation: disabled  Social Needs  . Financial resource strain: Not on file  . Food insecurity:    Worry: Not on file    Inability: Not on file  . Transportation needs:    Medical: Not on file    Non-medical: Not  on file  Tobacco Use  . Smoking status: Former Smoker    Packs/day: 2.00    Years: 45.00    Pack years: 90.00    Types: Cigarettes    Last attempt to quit: 07/30/2006    Years since quitting: 11.9  . Smokeless tobacco: Former Systems developer    Quit date: 07/31/2007  Substance and Sexual Activity  . Alcohol use: No    Alcohol/week: 0.0 standard drinks    Comment: previous alcohol use & home-made alcohol  consumption  . Drug use: No  . Sexual activity: Not Currently  Lifestyle  . Physical activity:    Days per week: Not on file    Minutes per session: Not on file  . Stress: Not on file  Relationships  . Social connections:    Talks on phone: Not on file    Gets together: Not on file    Attends religious service: Not on file    Active member of club or organization: Not on file    Attends meetings of clubs or organizations: Not on file    Relationship status: Not on file  . Intimate partner violence:    Fear of current or ex partner: Not on file    Emotionally abused: Not on file    Physically abused: Not on file    Forced sexual activity: Not on file  Other Topics Concern  . Not on file  Social History Narrative   Patient is divorced and has 2 children.   Patient is right handed.   Patient has 9 th grade education.    Patient drinks diet sodas.      [facility]:   From [place] originally. Always lived in [place]. 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 Previously traveled to [place], [place], & [place]. No pets currently. Remote pet owl and parakeet exposure. No mold exposure. Enjoys fishing.     Past Surgical History:  Procedure Laterality Date  . CARDIAC VALVE REPLACEMENT    . INTRAOPERATIVE TRANSESOPHAGEAL ECHOCARDIOGRAM N/A 03/24/2013   Procedure: INTRAOPERATIVE TRANSESOPHAGEAL ECHOCARDIOGRAM;  Surgeon: Rexene Alberts, MD;  Location: [place];  Service: Open Heart Surgery;  Laterality: N/A;  . MINIMALLY INVASIVE MAZE PROCEDURE N/A 03/24/2013   Procedure: MINIMALLY INVASIVE MAZE PROCEDURE;  Surgeon: Rexene Alberts, MD;  Location: [place];  Service: Open Heart Surgery;  Laterality: N/A;  . MITRAL VALVE REPLACEMENT  02/01/1998   Carpentier-Edwards porcine bioprosthetic tissue valve, size 12mm, placed for complicated bacterial endocarditis  . MITRAL VALVE REPLACEMENT Right 03/24/2013   Procedure: MINIMALLY INVASIVE REDO MITRAL VALVE (MV) REPLACEMENT;  Surgeon:  Rexene Alberts, MD;  Location: [place];  Service: Open Heart Surgery;  Laterality: Right;  . PROSTATE BIOPSY    . radiation treatment tx for prostate cancer  2015  . TEE WITHOUT CARDIOVERSION N/A 02/12/2013   Procedure: TRANSESOPHAGEAL ECHOCARDIOGRAM (TEE);  Surgeon: Fay Records, MD;  Location: [facility] ENDOSCOPY;  Service: Cardiovascular;  Laterality: N/A;  . VIDEO ASSISTED THORACOSCOPY  04/29/2007   Left VATS w/ mini thoracotomy for Left Lower Lobectomy for benign lung nodules    Family History  Problem Relation Age of Onset  . Cancer Father        prostate cancer  . Tuberculosis Father   . Heart attack Father   . Cancer Brother        prostate cancer  . Arthritis Mother   . Lung disease Neg Hx     Allergies  Allergen Reactions  .  Ace Inhibitors Swelling  . Rosuvastatin Other (See Comments)    Muscle aches    Current Outpatient Medications on File Prior to Visit  Medication Sig Dispense Refill  . cephALEXin (KEFLEX) 500 MG capsule Take 1 capsule (500 mg total) by mouth 3 (three) times daily. 21 capsule 0  . Cholecalciferol (VITAMIN D3) 1000 UNITS CAPS Take 1,000 Units by mouth daily.     Marland Kitchen ezetimibe (ZETIA) 10 MG tablet Take 1 tablet (10 mg total) by mouth daily. 90 tablet 3  . levETIRAcetam (KEPPRA) 500 MG tablet Take 1 tablet (500 mg total) by mouth 2 (two) times daily. 180 tablet 3  . metoprolol tartrate (LOPRESSOR) 25 MG tablet TAKE 1/2 TABLET TWICE DAILY 90 tablet 2  . phenytoin (DILANTIN) 100 MG ER capsule TAKE 3 CAPSULES (300MG ) AT BEDTIME 270 capsule 3  . temazepam (RESTORIL) 15 MG capsule Take 15 mg by mouth at bedtime as needed. sleep  3  . tiotropium (SPIRIVA HANDIHALER) 18 MCG inhalation capsule INHALE THE CONTENTS OF 1 CAPSULE EVERY DAY VIA HANDIHALER 30 capsule 0  . tiZANidine (ZANAFLEX) 4 MG tablet TAKE 1 TABLET EVERY 6 HOURS AS NEEDED FOR MUSCLE SPASMS 360 tablet 1  . traMADol (ULTRAM) 50 MG tablet TAKE 1 TABLET EVERY 8 HOURS IF NEEDED 90 tablet 0  . warfarin  (COUMADIN) 6 MG tablet TAKE AS DIRECTED BY COUMADIN CLINIC 80 tablet 0   No current facility-administered medications on file prior to visit.     BP (!) 142/80   Temp 98.4 F (36.9 C)   Wt 138 lb (62.6 kg)   BMI 23.14 kg/m       Objective:   Physical Exam  Constitutional: He is oriented to person, place, and time. He appears well-developed and well-nourished. No distress.  Musculoskeletal: Normal range of motion.  Neurological: He is alert and oriented to person, place, and time.  Skin: Skin is warm and dry. He is not diaphoretic.  Nodule has opened and is draining scant fluid. Sharp wire protruding from nodule.   Psychiatric: He has a normal mood and affect. His behavior is normal. Judgment and thought content normal.  Nursing note and vitals reviewed.     Assessment & Plan:  1. Infection of sternotomy closure wire, subsequent encounter - Spoke to [facility] - Dr. Guy Sandifer office and let them know what was going on. They will call patient and get him scheduled to be seen.  - In the meantime, advised patient to continue with antibiotics and keep wound covered.  - Wound cleaned with chlorhexidine and sterile gauze with tagaderm placed over wound. He was given wound care items to take home.   Dorothyann Peng, NP

## 2018-07-07 ENCOUNTER — Encounter: Payer: Self-pay | Admitting: Thoracic Surgery (Cardiothoracic Vascular Surgery)

## 2018-07-07 ENCOUNTER — Other Ambulatory Visit: Payer: Self-pay | Admitting: *Deleted

## 2018-07-07 ENCOUNTER — Ambulatory Visit (INDEPENDENT_AMBULATORY_CARE_PROVIDER_SITE_OTHER): Payer: Medicare HMO | Admitting: Thoracic Surgery (Cardiothoracic Vascular Surgery)

## 2018-07-07 VITALS — BP 140/84 | HR 54 | Resp 20 | Ht 64.5 in | Wt 139.0 lb

## 2018-07-07 DIAGNOSIS — Z953 Presence of xenogenic heart valve: Secondary | ICD-10-CM

## 2018-07-07 DIAGNOSIS — Z952 Presence of prosthetic heart valve: Secondary | ICD-10-CM | POA: Diagnosis not present

## 2018-07-07 DIAGNOSIS — T8189XA Other complications of procedures, not elsewhere classified, initial encounter: Secondary | ICD-10-CM | POA: Diagnosis not present

## 2018-07-07 DIAGNOSIS — T84218A Breakdown (mechanical) of internal fixation device of other bones, initial encounter: Secondary | ICD-10-CM
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
## 2018-07-07 NOTE — Patient Instructions (Signed)
Stop taking Coumadin (warfarin)  Continue taking all other medications without change through the day before surgery.  Have nothing to eat or drink after midnight the night before surgery.  On the morning of surgery take only Metoprolol, Keppra and Dilantin with a sip of water.
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
## 2018-07-08 ENCOUNTER — Ambulatory Visit: Payer: Medicare HMO | Admitting: Thoracic Surgery (Cardiothoracic Vascular Surgery)

## 2018-07-09 ENCOUNTER — Ambulatory Visit
Admission: RE | Admit: 2018-07-09 | Discharge: 2018-07-09 | Disposition: A | Discharge status: Home / Self Care | Payer: Medicare HMO | Source: Ambulatory Visit | Attending: Thoracic Surgery (Cardiothoracic Vascular Surgery) | Admitting: Thoracic Surgery (Cardiothoracic Vascular Surgery)

## 2018-07-09 ENCOUNTER — Other Ambulatory Visit: Payer: Self-pay | Admitting: Adult Health

## 2018-07-09 ENCOUNTER — Encounter (HOSPITAL_COMMUNITY): Payer: Self-pay | Admitting: *Deleted

## 2018-07-09 ENCOUNTER — Other Ambulatory Visit: Payer: Self-pay

## 2018-07-09 DIAGNOSIS — J439 Emphysema, unspecified: Secondary | ICD-10-CM | POA: Diagnosis not present

## 2018-07-09 DIAGNOSIS — T84218A Breakdown (mechanical) of internal fixation device of other bones, initial encounter: Secondary | ICD-10-CM

## 2018-07-09 DIAGNOSIS — Z760 Encounter for issue of repeat prescription: Secondary | ICD-10-CM

## 2018-07-09 NOTE — Progress Notes (Signed)
Pt denies any acute cardiopulmonary issues. Pt stated that he is under the care of Cecilie Kicks, NP , Cardiology. Pt denies having a stress test. Pt stated that his last dose of Coumadin was Sunday, 12/9. Pt made aware to stop taking vitamins, fish oil and herbal medications. Do not take any NSAIDs ie: Ibuprofen, Advil, Naproxen (Aleve), Motrin, BC and Goody Powder. Pt verbalized understanding of all pre-op instructions. PA, Anesthesiology, made aware of pt history; no new orders.

## 2018-07-10 ENCOUNTER — Other Ambulatory Visit: Payer: Self-pay

## 2018-07-10 ENCOUNTER — Encounter (HOSPITAL_COMMUNITY): Payer: Self-pay | Admitting: Orthopedic Surgery

## 2018-07-10 ENCOUNTER — Ambulatory Visit (HOSPITAL_COMMUNITY): Payer: Medicare HMO | Admitting: Physician Assistant

## 2018-07-10 ENCOUNTER — Observation Stay (HOSPITAL_COMMUNITY)
Admission: RE | Admit: 2018-07-10 | Discharge: 2018-07-10 | Disposition: A | Discharge status: Home / Self Care | Payer: Medicare HMO | Attending: Thoracic Surgery (Cardiothoracic Vascular Surgery) | Admitting: Thoracic Surgery (Cardiothoracic Vascular Surgery)

## 2018-07-10 ENCOUNTER — Encounter (HOSPITAL_COMMUNITY)
Admission: RE | Disposition: A | Discharge status: Home / Self Care | Payer: Self-pay | Source: Home / Self Care | Attending: Thoracic Surgery (Cardiothoracic Vascular Surgery)

## 2018-07-10 DIAGNOSIS — R569 Unspecified convulsions: Secondary | ICD-10-CM | POA: Insufficient documentation

## 2018-07-10 DIAGNOSIS — Z472 Encounter for removal of internal fixation device: Secondary | ICD-10-CM | POA: Diagnosis not present

## 2018-07-10 DIAGNOSIS — Z7901 Long term (current) use of anticoagulants: Secondary | ICD-10-CM | POA: Insufficient documentation

## 2018-07-10 DIAGNOSIS — I429 Cardiomyopathy, unspecified: Secondary | ICD-10-CM | POA: Diagnosis not present

## 2018-07-10 DIAGNOSIS — I110 Hypertensive heart disease with heart failure: Secondary | ICD-10-CM | POA: Insufficient documentation

## 2018-07-10 DIAGNOSIS — Z79899 Other long term (current) drug therapy: Secondary | ICD-10-CM | POA: Insufficient documentation

## 2018-07-10 DIAGNOSIS — I4891 Unspecified atrial fibrillation: Secondary | ICD-10-CM | POA: Diagnosis not present

## 2018-07-10 DIAGNOSIS — Y831 Surgical operation with implant of artificial internal device as the cause of abnormal reaction of the patient, or of later complication, without mention of misadventure at the time of the procedure: Secondary | ICD-10-CM | POA: Insufficient documentation

## 2018-07-10 DIAGNOSIS — Z953 Presence of xenogenic heart valve: Secondary | ICD-10-CM | POA: Diagnosis not present

## 2018-07-10 DIAGNOSIS — I5032 Chronic diastolic (congestive) heart failure: Secondary | ICD-10-CM | POA: Diagnosis not present

## 2018-07-10 DIAGNOSIS — T8132XA Disruption of internal operation (surgical) wound, not elsewhere classified, initial encounter: Secondary | ICD-10-CM | POA: Diagnosis not present

## 2018-07-10 DIAGNOSIS — Z87891 Personal history of nicotine dependence: Secondary | ICD-10-CM | POA: Insufficient documentation

## 2018-07-10 DIAGNOSIS — T85898A Other specified complication of other internal prosthetic devices, implants and grafts, initial encounter: Secondary | ICD-10-CM | POA: Diagnosis not present

## 2018-07-10 DIAGNOSIS — J449 Chronic obstructive pulmonary disease, unspecified: Secondary | ICD-10-CM | POA: Diagnosis not present

## 2018-07-10 DIAGNOSIS — I509 Heart failure, unspecified: Secondary | ICD-10-CM | POA: Insufficient documentation

## 2018-07-10 DIAGNOSIS — Z8546 Personal history of malignant neoplasm of prostate: Secondary | ICD-10-CM | POA: Insufficient documentation

## 2018-07-10 DIAGNOSIS — T8189XA Other complications of procedures, not elsewhere classified, initial encounter: Secondary | ICD-10-CM | POA: Diagnosis present

## 2018-07-10 DIAGNOSIS — Z4689 Encounter for fitting and adjustment of other specified devices: Secondary | ICD-10-CM | POA: Diagnosis not present

## 2018-07-10 HISTORY — PX: STERNAL WIRES REMOVAL: SHX2441

## 2018-07-10 HISTORY — DX: Other complications of procedures, not elsewhere classified, initial encounter: T81.89XA

## 2018-07-10 LAB — COMPREHENSIVE METABOLIC PANEL
ALBUMIN: 4.1 g/dL (ref 3.5–5.0)
ALK PHOS: 89 U/L (ref 38–126)
ALT: 37 U/L (ref 0–44)
AST: 25 U/L (ref 15–41)
Anion gap: 12 (ref 5–15)
BUN: 11 mg/dL (ref 8–23)
CO2: 22 mmol/L (ref 22–32)
CREATININE: 1.25 mg/dL — AB (ref 0.61–1.24)
Calcium: 9 mg/dL (ref 8.9–10.3)
Chloride: 108 mmol/L (ref 98–111)
GFR calc Af Amer: >60 mL/min (ref >60)
GFR, EST NON AFRICAN AMERICAN: 57 mL/min — AB (ref >60)
Glucose, Bld: 90 mg/dL (ref 70–99)
Potassium: 3.9 mmol/L (ref 3.5–5.1)
Sodium: 142 mmol/L (ref 135–145)
Total Bilirubin: 0.8 mg/dL (ref 0.3–1.2)
Total Protein: 6.5 g/dL (ref 6.5–8.1)

## 2018-07-10 LAB — PROTIME-INR
INR: 1.51
Prothrombin Time: 18 seconds — ABNORMAL HIGH (ref 11.4–15.2)

## 2018-07-10 LAB — TYPE AND SCREEN
ABO/RH(D): A POS
Antibody Screen: NEGATIVE

## 2018-07-10 LAB — CBC
HCT: 39.3 % (ref 39.0–52.0)
Hemoglobin: 12.1 g/dL — ABNORMAL LOW (ref 13.0–17.0)
MCH: 31.3 pg (ref 26.0–34.0)
MCHC: 30.8 g/dL (ref 30.0–36.0)
MCV: 101.6 fL — ABNORMAL HIGH (ref 80.0–100.0)
NRBC: 0 % (ref 0.0–0.2)
Platelets: 183 10*3/uL (ref 150–400)
RBC: 3.87 MIL/uL — ABNORMAL LOW (ref 4.22–5.81)
RDW: 13 % (ref 11.5–15.5)
WBC: 4.7 10*3/uL (ref 4.0–10.5)

## 2018-07-10 LAB — SURGICAL PCR SCREEN
MRSA, PCR: NEGATIVE
Staphylococcus aureus: NEGATIVE

## 2018-07-10 LAB — APTT: aPTT: 33 seconds (ref 24–36)

## 2018-07-10 SURGERY — REMOVAL, STERNAL WIRE
Anesthesia: Monitor Anesthesia Care | Site: Chest

## 2018-07-10 MED ORDER — OXYCODONE HCL 5 MG PO TABS: 5.0000 mg | ORAL_TABLET | ORAL | Status: DC | PRN

## 2018-07-10 MED ORDER — FENTANYL CITRATE (PF) 250 MCG/5ML IJ SOLN
INTRAMUSCULAR | Status: AC
  Filled 2018-07-10: qty 5

## 2018-07-10 MED ORDER — LACTATED RINGERS IV SOLN
INTRAVENOUS | Status: DC
  Administered 2018-07-10 (×2): via INTRAVENOUS

## 2018-07-10 MED ORDER — SODIUM CHLORIDE 0.9 % IR SOLN
Status: DC | PRN
  Administered 2018-07-10: 1000 mL

## 2018-07-10 MED ORDER — CEFAZOLIN SODIUM-DEXTROSE 2-4 GM/100ML-% IV SOLN
2.0000 g | INTRAVENOUS | Status: AC
  Administered 2018-07-10: 2 g via INTRAVENOUS

## 2018-07-10 MED ORDER — FENTANYL CITRATE (PF) 100 MCG/2ML IJ SOLN: 25.0000 ug | INTRAMUSCULAR | Status: DC | PRN

## 2018-07-10 MED ORDER — MUPIROCIN 2 % EX OINT
1.0000 "application " | TOPICAL_OINTMENT | Freq: Once | CUTANEOUS | Status: AC
  Administered 2018-07-10: 1 via TOPICAL
  Filled 2018-07-10: qty 22

## 2018-07-10 MED ORDER — MORPHINE SULFATE (PF) 2 MG/ML IV SOLN: 2.0000 mg | INTRAVENOUS | Status: DC | PRN

## 2018-07-10 MED ORDER — FENTANYL CITRATE (PF) 100 MCG/2ML IJ SOLN
INTRAMUSCULAR | Status: DC | PRN
  Administered 2018-07-10: 50 ug via INTRAVENOUS
  Administered 2018-07-10: 25 ug via INTRAVENOUS

## 2018-07-10 MED ORDER — LIDOCAINE HCL (PF) 1 % IJ SOLN
INTRAMUSCULAR | Status: AC
  Filled 2018-07-10: qty 30

## 2018-07-10 MED ORDER — LIDOCAINE HCL (PF) 1 % IJ SOLN
INTRAMUSCULAR | Status: DC | PRN
  Administered 2018-07-10: 30 mL

## 2018-07-10 MED ORDER — CEFAZOLIN SODIUM 1 G IJ SOLR
INTRAMUSCULAR | Status: AC
  Filled 2018-07-10: qty 20

## 2018-07-10 MED ORDER — ONDANSETRON HCL 4 MG/2ML IJ SOLN
INTRAMUSCULAR | Status: DC | PRN
  Administered 2018-07-10: 4 mg via INTRAVENOUS

## 2018-07-10 MED ORDER — PROPOFOL 500 MG/50ML IV EMUL
INTRAVENOUS | Status: DC | PRN
  Administered 2018-07-10: 75 ug/kg/min via INTRAVENOUS

## 2018-07-10 SURGICAL SUPPLY — 55 items
ATTRACTOMAT 16X20 MAGNETIC DRP (DRAPES) ×2 IMPLANT
BAG DECANTER FOR FLEXI CONT (MISCELLANEOUS) ×2 IMPLANT
BLADE SURG 10 STRL SS (BLADE) ×6 IMPLANT
BLADE SURG 15 STRL LF DISP TIS (BLADE) ×1 IMPLANT
BLADE SURG 15 STRL SS (BLADE) ×1
BNDG GAUZE ELAST 4 BULKY (GAUZE/BANDAGES/DRESSINGS) IMPLANT
CANISTER SUCT 3000ML PPV (MISCELLANEOUS) ×2 IMPLANT
CATH THORACIC 28FR RT ANG (CATHETERS) IMPLANT
CATH THORACIC 36FR (CATHETERS) IMPLANT
CATH THORACIC 36FR RT ANG (CATHETERS) IMPLANT
CLIP VESOCCLUDE SM WIDE 24/CT (CLIP) IMPLANT
CONT SPEC 4OZ CLIKSEAL STRL BL (MISCELLANEOUS) ×4 IMPLANT
COVER SURGICAL LIGHT HANDLE (MISCELLANEOUS) ×2 IMPLANT
COVER WAND RF STERILE (DRAPES) ×2 IMPLANT
DRAPE LAPAROSCOPIC ABDOMINAL (DRAPES) ×2 IMPLANT
DRAPE WARM FLUID 44X44 (DRAPE) IMPLANT
DRSG AQUACEL AG ADV 3.5X 4 (GAUZE/BANDAGES/DRESSINGS) ×2 IMPLANT
ELECT REM PT RETURN 9FT ADLT (ELECTROSURGICAL) ×2
ELECTRODE REM PT RTRN 9FT ADLT (ELECTROSURGICAL) ×1 IMPLANT
GAUZE SPONGE 4X4 12PLY STRL (GAUZE/BANDAGES/DRESSINGS) ×2 IMPLANT
GAUZE SPONGE 4X4 12PLY STRL LF (GAUZE/BANDAGES/DRESSINGS) ×2 IMPLANT
GAUZE XEROFORM 5X9 LF (GAUZE/BANDAGES/DRESSINGS) IMPLANT
GLOVE ORTHO TXT STRL SZ7.5 (GLOVE) ×2 IMPLANT
GOWN STRL REUS W/ TWL LRG LVL3 (GOWN DISPOSABLE) ×1 IMPLANT
GOWN STRL REUS W/TWL LRG LVL3 (GOWN DISPOSABLE) ×1
HANDPIECE INTERPULSE COAX TIP (DISPOSABLE)
HEMOSTAT POWDER SURGIFOAM 1G (HEMOSTASIS) IMPLANT
KIT BASIN OR (CUSTOM PROCEDURE TRAY) ×2 IMPLANT
KIT SUCTION CATH 14FR (SUCTIONS) IMPLANT
KIT TURNOVER KIT B (KITS) ×2 IMPLANT
NEEDLE 22X1 1/2 (OR ONLY) (NEEDLE) ×2 IMPLANT
NS IRRIG 1000ML POUR BTL (IV SOLUTION) ×2 IMPLANT
PACK CHEST (CUSTOM PROCEDURE TRAY) ×2 IMPLANT
PAD ARMBOARD 7.5X6 YLW CONV (MISCELLANEOUS) ×4 IMPLANT
SET HNDPC FAN SPRY TIP SCT (DISPOSABLE) IMPLANT
SOL PREP POV-IOD 4OZ 10% (MISCELLANEOUS) IMPLANT
SPONGE LAP 18X18 X RAY DECT (DISPOSABLE) ×4 IMPLANT
SUT ETHILON 3 0 FSL (SUTURE) IMPLANT
SUT MNCRL AB 3-0 PS2 18 (SUTURE) ×2 IMPLANT
SUT PDS AB 1 CTX 36 (SUTURE) ×2 IMPLANT
SUT STEEL 6MS V (SUTURE) IMPLANT
SUT STEEL STERNAL CCS#1 18IN (SUTURE) IMPLANT
SUT STEEL SZ 6 DBL 3X14 BALL (SUTURE) IMPLANT
SUT VIC AB 2-0 CTX 27 (SUTURE) IMPLANT
SWAB COLLECTION DEVICE MRSA (MISCELLANEOUS) IMPLANT
SWAB CULTURE ESWAB REG 1ML (MISCELLANEOUS) IMPLANT
SYR CONTROL 10ML LL (SYRINGE) ×2 IMPLANT
SYSTEM SAHARA CHEST DRAIN ATS (WOUND CARE) ×2 IMPLANT
TAPE CLOTH SURG 4X10 WHT LF (GAUZE/BANDAGES/DRESSINGS) ×2 IMPLANT
TOWEL GREEN STERILE (TOWEL DISPOSABLE) ×2 IMPLANT
TOWEL GREEN STERILE FF (TOWEL DISPOSABLE) ×2 IMPLANT
TRAY FOLEY SLVR 14FR TEMP STAT (SET/KITS/TRAYS/PACK) ×2 IMPLANT
TUBE CONNECTING 20X1/4 (TUBING) ×2 IMPLANT
WATER STERILE IRR 1000ML POUR (IV SOLUTION) ×2 IMPLANT
YANKAUER SUCT BULB TIP NO VENT (SUCTIONS) ×2 IMPLANT

## 2018-07-10 NOTE — Anesthesia Procedure Notes (Signed)
Procedure Name: MAC Date/Time: 07/10/2018 2:40 PM Performed by: Lavell Luster, CRNA Pre-anesthesia Checklist: Patient identified, Emergency Drugs available, Suction available, Patient being monitored and Timeout performed Patient Re-evaluated:Patient Re-evaluated prior to induction Oxygen Delivery Method: Nasal cannula Preoxygenation: Pre-oxygenation with 100% oxygen Induction Type: IV induction Placement Confirmation: breath sounds checked- equal and bilateral and positive ETCO2 Dental Injury: Teeth and Oropharynx as per pre-operative assessment  Comments: Pt has top dentures in.  Henderson Cloud, CRNA

## 2018-07-10 NOTE — Anesthesia Preprocedure Evaluation (Addendum)
Anesthesia Evaluation  Patient identified by MRN, date of birth, ID band Patient awake    Reviewed: Allergy & Precautions, NPO status , Patient's Chart, lab work & pertinent test results  Airway Mallampati: II  TM Distance: >3 FB     Dental   Pulmonary shortness of breath, COPD, former smoker,    breath sounds clear to auscultation       Cardiovascular hypertension, +CHF   Rhythm:Regular Rate:Normal  History noted. CG   Neuro/Psych Seizures -,     GI/Hepatic Neg liver ROS, GERD  ,  Endo/Other  negative endocrine ROS  Renal/GU negative Renal ROS     Musculoskeletal  (+) Arthritis ,   Abdominal   Peds  Hematology  (+) anemia ,   Anesthesia Other Findings   Reproductive/Obstetrics                             Anesthesia Physical Anesthesia Plan  ASA: III  Anesthesia Plan: MAC   Post-op Pain Management:    Induction: Intravenous  PONV Risk Score and Plan: 2 and Treatment may vary due to age or medical condition  Airway Management Planned: Simple Face Mask and Nasal Cannula  Additional Equipment:   Intra-op Plan:   Post-operative Plan:   Informed Consent:   Dental advisory given  Plan Discussed with: CRNA and Anesthesiologist  Anesthesia Plan Comments:         Anesthesia Quick Evaluation
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
## 2018-07-10 NOTE — Interval H&P Note (Signed)
History and Physical Interval Note:  07/10/2018 1:42 PM  Jeffery Copeland  has presented today for surgery, with the diagnosis of PROTRUDING STERNAL WIRE  The various methods of treatment have been discussed with the patient and family. After consideration of risks, benefits and other options for treatment, the patient has consented to  Procedure(s): STERNAL WIRES REMOVAL (N/A) as a surgical intervention .  The patient's history has been reviewed, patient examined, no change in status, stable for surgery.  I have reviewed the patient's chart and labs.  Questions were answered to the patient's satisfaction.     Rexene Alberts

## 2018-07-10 NOTE — Telephone Encounter (Signed)
Denied.  Filled in Feb 2019 for 1 year.  Request is too early.

## 2018-07-10 NOTE — Discharge Instructions (Signed)

## 2018-07-10 NOTE — Transfer of Care (Signed)
Immediate Anesthesia Transfer of Care Note  Patient: Jeffery Copeland  Procedure(s) Performed: STERNAL WIRES REMOVAL (N/A Chest)  Patient Location: PACU  Anesthesia Type:MAC  Level of Consciousness: awake, alert  and oriented  Airway & Oxygen Therapy: Patient Spontanous Breathing and Patient connected to nasal cannula oxygen  Post-op Assessment: Report given to RN and Post -op Vital signs reviewed and stable  Post vital signs: Reviewed and stable  Last Vitals:  Vitals Value Taken Time  BP    Temp    Pulse    Resp    SpO2      Last Pain:  Vitals:   07/10/18 1249  TempSrc:   PainSc: 0-No pain      Patients Stated Pain Goal: 3 (83/66/29 4765)  Complications: No apparent anesthesia complications

## 2018-07-10 NOTE — Anesthesia Postprocedure Evaluation (Signed)
Anesthesia Post Note  Patient: Jeffery Copeland  Procedure(s) Performed: STERNAL WIRES REMOVAL (N/A Chest)     Patient location during evaluation: PACU Anesthesia Type: MAC Level of consciousness: awake Pain management: pain level controlled Vital Signs Assessment: post-procedure vital signs reviewed and stable Respiratory status: spontaneous breathing Cardiovascular status: stable Postop Assessment: no headache Anesthetic complications: no    Last Vitals:  Vitals:   07/10/18 1220 07/10/18 1500  BP: (!) 157/90 139/76  Pulse: 60 (!) 59  Resp: 18 15  Temp: 36.4 C 36.4 C  SpO2: 98% 99%    Last Pain:  Vitals:   07/10/18 1500  TempSrc:   PainSc: 0-No pain                 Nekesha Font

## 2018-07-11 ENCOUNTER — Encounter (HOSPITAL_COMMUNITY): Payer: Self-pay | Admitting: Thoracic Surgery (Cardiothoracic Vascular Surgery)

## 2018-07-11 ENCOUNTER — Other Ambulatory Visit: Payer: Self-pay | Admitting: Adult Health

## 2018-07-12 NOTE — Discharge Summary (Signed)
No discharge summary necessary, outpatient procedure John Giovanni, PA-C

## 2018-07-15 ENCOUNTER — Other Ambulatory Visit: Payer: Self-pay | Admitting: Adult Health

## 2018-07-15 NOTE — Telephone Encounter (Signed)
Copied from [place] [phone]. Topic: Quick Communication - Rx Refill/Question >> Jul 15, 2018  9:21 AM Bea Graff, NT wrote: Medication: tiotropium (SPIRIVA HANDIHALER) 18 MCG inhalation capsule, warfarin (COUMADIN) 6 MG tablet, and traMADol (ULTRAM) 50 MG tablet   Has the patient contacted their pharmacy? Yes.   (Agent: If no, request that the patient contact the pharmacy for the refill.) (Agent: If yes, when and what did the pharmacy advise?)  Preferred Pharmacy (with phone number or street name): [place], [place] [phone] (Phone) [phone] (Fax)    Agent: Please be advised that RX refills may take up to 3 business days. We ask that you follow-up with your pharmacy.

## 2018-07-21 ENCOUNTER — Ambulatory Visit (INDEPENDENT_AMBULATORY_CARE_PROVIDER_SITE_OTHER): Payer: Medicare HMO | Admitting: Pharmacist

## 2018-07-21 DIAGNOSIS — Z5181 Encounter for therapeutic drug level monitoring: Secondary | ICD-10-CM

## 2018-07-21 DIAGNOSIS — I4891 Unspecified atrial fibrillation: Secondary | ICD-10-CM | POA: Diagnosis not present

## 2018-07-21 LAB — POCT INR: INR: 2.4 (ref 2.0–3.0)

## 2018-07-21 NOTE — Patient Instructions (Signed)
Description   Take 1 tablet today then resume the same dosage 1 tablet daily except 1/2 tablet on Mondays, Wednesdays and Fridays.  Recheck INR in 4 weeks.

## 2018-07-28 ENCOUNTER — Telehealth: Payer: Self-pay | Admitting: Adult Health

## 2018-07-28 ENCOUNTER — Other Ambulatory Visit: Payer: Self-pay | Admitting: Cardiology

## 2018-07-28 ENCOUNTER — Other Ambulatory Visit: Payer: Self-pay | Admitting: Adult Health

## 2018-07-28 MED ORDER — WARFARIN SODIUM 6 MG PO TABS: ORAL_TABLET | ORAL | 3 refills | Status: DC

## 2018-07-28 NOTE — Telephone Encounter (Signed)
Copied from [place] [phone]. Topic: Quick Communication - Rx Refill/Question >> Jul 28, 2018  2:02 PM Alanda Slim E wrote: Medication: traMADol (ULTRAM) 50 MG tablet  warfarin (COUMADIN) 6 MG tablet  tiotropium (SPIRIVA HANDIHALER) 18 MCG inhalation capsule   Has the patient contacted their pharmacy? Yes.  (pharmacy sent in request and told Pt to call Dr. office)    Preferred Pharmacy (with phone number or street name): [place], [place] - [place] [phone] (Phone) [phone] (Fax)    Agent: Please be advised that RX refills may take up to 3 business days. We ask that you follow-up with your pharmacy.

## 2018-07-29 ENCOUNTER — Encounter: Payer: Self-pay | Admitting: *Deleted

## 2018-07-29 NOTE — Telephone Encounter (Signed)
Copied from [place] [phone]. Topic: Quick Communication - Rx Refill/Question >> Jul 28, 2018  2:02 PM Alanda Slim E wrote: Medication: traMADol (ULTRAM) 50 MG tablet  warfarin (COUMADIN) 6 MG tablet  tiotropium (SPIRIVA HANDIHALER) 18 MCG inhalation capsule   Has the patient contacted their pharmacy? Yes.  (pharmacy sent in request and told Pt to call Dr. office)    Preferred Pharmacy (with phone number or street name): [place], [place] - [place] [phone] (Phone) [phone] (Fax)    Agent: Please be advised that RX refills may take up to 3 business days. We ask that you follow-up with your pharmacy. >> Jul 29, 2018 11:55 AM Alanda Slim E wrote: Pt called to check status of request/ advised of Warfarin being at the [facility] pharmacy, the inhaler was sent to [facility] and waiting on Tramadol

## 2018-07-29 NOTE — Telephone Encounter (Signed)
Requested medication (s) are due for refill today yes  Requested medication (s) are on the active medication list yes  Future visit scheduled yes  Pended and routed to PCP for consideration Requested Prescriptions  Pending Prescriptions Disp Refills   traMADol (ULTRAM) 50 MG tablet [Pharmacy Med Name: TRAMADOL HCL 50 MG Tablet] 90 tablet     Sig: TAKE 1 TABLET EVERY 8 HOURS IF NEEDED     Not Delegated - Analgesics:  Opioid Agonists Failed - 07/29/2018  9:17 AM      Failed - This refill cannot be delegated      Failed - Urine Drug Screen completed in last 360 days.      Passed - Valid encounter within last 6 months    Recent Outpatient Visits          3 weeks ago Infection of sternotomy closure wire, subsequent encounter   [facility], [place], NP   1 month ago Skin lesion   [facility] O[facility]ullivan, Lenna Sciara, NP   4 months ago Routine general medical examination at a health care facility   [employer] [facility], [place], NP   1 year ago Routine general medical examination at a health care facility   [employer] [facility], [place], NP   1 year ago Encounter to establish care   [employer] [facility], [place], NP

## 2018-07-31 ENCOUNTER — Telehealth: Payer: Self-pay | Admitting: Primary Care

## 2018-07-31 MED ORDER — TIOTROPIUM BROMIDE MONOHYDRATE 18 MCG IN CAPS: ORAL_CAPSULE | RESPIRATORY_TRACT | 0 refills | Status: DC

## 2018-07-31 NOTE — Telephone Encounter (Signed)
Spoke with pt. He is needing a refill on Spiriva HH. This has been sent in. Pt has a pending OV with Beth on 08/05/2018.

## 2018-07-31 NOTE — Telephone Encounter (Signed)
Patient's son, Jeneen Rinks, calling and was checking on medication refills. Advised him that a few of these medications were prescribed by other providers not at this office. Advised to him that the only one that this office could refill would be the tramadol. Jeneen Rinks states understanding. Advised that the tramadol was sent to [facility] on 07/29/18. Would like to know if a 10 day supply could be sent to the local pharmacy to get him through until his mail order comes in? Please advise.  [id number] - [place], [place]

## 2018-08-01 ENCOUNTER — Telehealth: Payer: Self-pay | Admitting: Adult Health

## 2018-08-01 ENCOUNTER — Other Ambulatory Visit: Payer: Self-pay | Admitting: Adult Health

## 2018-08-01 MED ORDER — TRAMADOL HCL 50 MG PO TABS: 50.0000 mg | ORAL_TABLET | Freq: Three times a day (TID) | ORAL | 0 refills | Status: AC | PRN

## 2018-08-01 NOTE — Telephone Encounter (Signed)
Attempted to call pharmacy again- on hold for 4 minutes- could not hold longer.

## 2018-08-01 NOTE — Telephone Encounter (Signed)
10 day supply sent to local pharmacy.

## 2018-08-01 NOTE — Telephone Encounter (Signed)
Attempted to call pharmacy to verify that Rx are there- they should be there. On hold for 5 minutes.

## 2018-08-01 NOTE — Telephone Encounter (Signed)
Copied from [place] [phone]. Topic: Quick Communication - Rx Refill/Question >> Aug 01, 2018 12:25 PM [place], [place] D wrote: Medication: traMADol (ULTRAM) 50 MG tablet / tiotropium (SPIRIVA HANDIHALER) 18 MCG inhalation capsule / Pt's son Jeffery Copeland stated he contact pharmacy about tramadol and spiriva and was told they did not have the rx's. Tried to contact pharmacy to confirm but did not get anyone on the line. Please contact son. FP#[phone]  Has the patient contacted their pharmacy? Yes.   (Agent: If no, request that the patient contact the pharmacy for the refill.) (Agent: If yes, when and what did the pharmacy advise?)  Preferred Pharmacy (with phone number or street name): [id number] [place], [place] [phone] (Phone) [phone] (Fax)    Agent: Please be advised that RX refills may take up to 3 business days. We ask that you follow-up with your pharmacy.

## 2018-08-01 NOTE — Telephone Encounter (Signed)
No DPR on file for [place].  Called and spoke to the patient and informed him that a 10 day supply of tramadol has been sent to [facility].  Nothing further needed.

## 2018-08-05 ENCOUNTER — Ambulatory Visit (INDEPENDENT_AMBULATORY_CARE_PROVIDER_SITE_OTHER): Payer: Medicare HMO | Admitting: Primary Care

## 2018-08-05 ENCOUNTER — Encounter: Payer: Self-pay | Admitting: Primary Care

## 2018-08-05 DIAGNOSIS — Z23 Encounter for immunization: Secondary | ICD-10-CM | POA: Diagnosis not present

## 2018-08-05 DIAGNOSIS — J449 Chronic obstructive pulmonary disease, unspecified: Secondary | ICD-10-CM

## 2018-08-05 MED ORDER — TIOTROPIUM BROMIDE MONOHYDRATE 18 MCG IN CAPS: ORAL_CAPSULE | RESPIRATORY_TRACT | 3 refills | Status: DC

## 2018-08-05 NOTE — Progress Notes (Signed)
@Patient  ID: Jeffery Copeland, male    DOB: 01-12-1946, 73 y.o.   MRN: [id number]  Chief Complaint  Patient presents with  . Follow-up    States he is here for his yearly check up so he can continue to get his inhaler refilled. States no changes with his breathing and that he has been doing great.     Referring provider: Dorothyann Peng, NP  HPI: 73 year old male, former smoker. PMH significant for moderate COPD with emphysema, lung nodule, GERD, afib, prostate cancer. Previous patient of Dr. Ashok Cordia, last seen by Carolynn Serve, NP on 11/27/2016. Called office recently needing refill Spiriva.   08/06/2018 Patient presents today for general overview. He feels well with no acute complaints. Continues taking Spiriva as prescribed. No significant shortness of breath, some dyspnea with long walks but reports that it doesn't take long to recover. He has a dry cough, no production. Denies wheezing. CT in December showed moderate emphysematous changes, upper lobe predominant. Due for influenza vaccine today, last received in feb 2019.    TESTING > 11/06/2016: FVC 72%, FEV1 61%, ratio 61, no significant bronchodilatory response, DLCO 35%.  > 6MWT 08/29/2015- No oxygen desaturation  > Chest CT 2017- stable 55mm right middle lobe nodule unchanged from 2014  > 03/03/13: FVC 2.85 L (76%) FEV1 1.78 L (64%) FEV1/FVC 0.63 no bronchodilator response TLC 5.50 L (91%) RV 110% (unable to obtain DLCO)   > CHEST CT W/O 09/19/12 (per radiologist): 35mm nodule along right major fissure. Moderate centrilobular emphysema. Left lower lobectomy noted. Heart mildly enlarged w/ prosthetic mitral valve. No pathologic mediastinal lymph nodes.  Allergies  Allergen Reactions  . Ace Inhibitors Swelling  . Lisinopril     Seizures   . Rosuvastatin Other (See Comments)    Muscle aches    Immunization History  Administered Date(s) Administered  . Influenza Split 03/30/2012, 06/15/2015  . Influenza Whole 07/02/2011  .  Influenza, High Dose Seasonal PF 04/29/2016, 09/06/2017, 08/05/2018  . Influenza-Unspecified 03/30/2014, 06/30/2015  . PPD Test 10/26/2015, 01/03/2016  . Pneumococcal Conjugate-13 03/25/2018  . Td 12/27/2015  . Tdap 12/22/2012    Past Medical History:  Diagnosis Date  . Acute and subacute bacterial endocarditis 1999   group G Streptococcus  . Atrial fibrillation ([place]) 11/11/2008   Qualifier: Diagnosis of  By: Lia Foyer, MD, Jaquelyn Bitter   . CARDIOMYOPATHY 10/20/2008   Qualifier: Diagnosis of  By: Owens Shark, RN, BSN, Lauren    . Cerebrovascular disease, unspecified   . Closed right acetabular fracture ([place])   . COPD (chronic obstructive pulmonary disease) ([place]) 1999  . Epidural hematoma ([place]) 03/01/2011   Recent fall 2012   . Esophageal reflux   . Generalized osteoarthrosis, unspecified site   . History of tobacco abuse   . Hypertension   . HYPERTENSION, BENIGN 10/20/2008   Qualifier: Diagnosis of  By: Owens Shark, RN, BSN, Lauren    . Hyponatremia   . Insomnia   . Iron deficiency anemia, unspecified   . ISCHEMIC COLITIS, HX OF 10/20/2008   Qualifier: Diagnosis of  By: Owens Shark, RN, BSN, Lauren    . Lung nodules 2008, 2014   LLL nodule removed 2008 with LLL lobectomy,  RLL nodule 20mm observation  . Prostate cancer ([place]) 2015  . Protruding sternal wires   . S/P mitral valve replacement 02/01/1998   Carpentier-Edwards porcine bioprosthetic tissue valve, size 88mm Placed for acute and subacute bacterial endocarditis (group G Streptococcus) with pre-existing mitral valve prolapse   . S/P  mitral valve replacement with bioprosthetic valve 03/24/2013   Redo mitral valve replacement using 28mm [facility] mitral bovine bioprosthetic tissue valve performed via right mini thoracotomy  . Seizures ([place])    last seizure 1 year ago  . Severe mitral regurgitation 02/20/2013  . Unsteady gait     Tobacco History: Social History   Tobacco Use  Smoking Status Former Smoker  . Packs/day: 2.00  .  Years: 45.00  . Pack years: 90.00  . Types: Cigarettes  . Last attempt to quit: 07/30/2006  . Years since quitting: 12.0  Smokeless Tobacco Never Used   Counseling given: Not Answered   Outpatient Medications Prior to Visit  Medication Sig Dispense Refill  . Cholecalciferol (VITAMIN D3) 1000 UNITS CAPS Take 1,000 Units by mouth daily.     Marland Kitchen ezetimibe (ZETIA) 10 MG tablet Take 1 tablet (10 mg total) by mouth daily. 90 tablet 3  . levETIRAcetam (KEPPRA) 500 MG tablet Take 1 tablet (500 mg total) by mouth 2 (two) times daily. 180 tablet 3  . metoprolol tartrate (LOPRESSOR) 25 MG tablet TAKE 1/2 TABLET TWICE DAILY (Patient taking differently: Take 12.5 mg by mouth 2 (two) times daily. ) 90 tablet 2  . phenytoin (DILANTIN) 100 MG ER capsule TAKE 3 CAPSULES (300MG ) AT BEDTIME (Patient taking differently: Take 300 mg by mouth at bedtime. ) 270 capsule 3  . temazepam (RESTORIL) 15 MG capsule Take 15 mg by mouth at bedtime as needed for sleep.   3  . tiZANidine (ZANAFLEX) 4 MG tablet TAKE 1 TABLET EVERY 6 HOURS AS NEEDED FOR MUSCLE SPASMS (Patient taking differently: Take 4 mg by mouth every 6 (six) hours as needed for muscle spasms. ) 360 tablet 1  . traMADol (ULTRAM) 50 MG tablet TAKE 1 TABLET EVERY 8 HOURS IF NEEDED 90 tablet 1  . traMADol (ULTRAM) 50 MG tablet Take 1 tablet (50 mg total) by mouth every 8 (eight) hours as needed for up to 10 days. 30 tablet 0  . warfarin (COUMADIN) 6 MG tablet Take As Directed 30 tablet 3  . tiotropium (SPIRIVA HANDIHALER) 18 MCG inhalation capsule INHALE THE CONTENTS OF 1 CAPSULE EVERY DAY VIA HANDIHALER 30 capsule 0   No facility-administered medications prior to visit.     Review of Systems  Review of Systems  Constitutional: Negative.   HENT: Negative.   Respiratory: Positive for cough. Negative for shortness of breath and wheezing.   Cardiovascular: Negative.     Physical Exam  BP (!) 146/80   Pulse 64   Temp 97.6 F (36.4 C) (Oral)   Ht 5\' 4"   (1.626 m)   Wt 148 lb (67.1 kg)   SpO2 94%   BMI 25.40 kg/m  Physical Exam Constitutional:      Appearance: Normal appearance.  HENT:     Head: Normocephalic and atraumatic.     Comments: HOH    Mouth/Throat:     Mouth: Mucous membranes are moist.     Pharynx: Oropharynx is clear.  Neck:     Musculoskeletal: Normal range of motion and neck supple.  Cardiovascular:     Rate and Rhythm: Normal rate and regular rhythm.  Pulmonary:     Effort: Pulmonary effort is normal.     Breath sounds: No wheezing or rhonchi.     Comments: CTA Musculoskeletal: Normal range of motion.     Comments: Amb with cane  Lymphadenopathy:     Cervical: No cervical adenopathy.  Skin:    General: Skin  is warm and dry.  Neurological:     General: No focal deficit present.     Mental Status: He is alert and oriented to person, place, and time. Mental status is at baseline.  Psychiatric:        Mood and Affect: Mood normal.        Behavior: Behavior normal.        Thought Content: Thought content normal.        Judgment: Judgment normal.      Lab Results:  CBC    Component Value Date/Time   WBC 4.7 07/10/2018 1245   RBC 3.87 (L) 07/10/2018 1245   HGB 12.1 (L) 07/10/2018 1245   HGB 12.2 (L) 10/22/2017 1050   HCT 39.3 07/10/2018 1245   HCT 36.3 (L) 10/22/2017 1050   PLT 183 07/10/2018 1245   PLT 199 10/22/2017 1050   MCV 101.6 (H) 07/10/2018 1245   MCV 97 10/22/2017 1050   MCH 31.3 07/10/2018 1245   MCHC 30.8 07/10/2018 1245   RDW 13.0 07/10/2018 1245   RDW 13.6 10/22/2017 1050   LYMPHSABS 0.8 03/25/2018 1604   LYMPHSABS 0.6 (L) 10/22/2017 1050   MONOABS 1.0 03/25/2018 1604   EOSABS 0.1 03/25/2018 1604   EOSABS 0.1 10/22/2017 1050   BASOSABS 0.1 03/25/2018 1604   BASOSABS 0.0 10/22/2017 1050    BMET    Component Value Date/Time   NA 142 07/10/2018 1245   NA 139 10/22/2017 1050   K 3.9 07/10/2018 1245   CL 108 07/10/2018 1245   CO2 22 07/10/2018 1245   GLUCOSE 90 07/10/2018  1245   BUN 11 07/10/2018 1245   BUN 6 (L) 10/22/2017 1050   CREATININE 1.25 (H) 07/10/2018 1245   CREATININE 1.07 08/12/2015 1500   CALCIUM 9.0 07/10/2018 1245   GFRNONAA 57 (L) 07/10/2018 1245   GFRAA >60 07/10/2018 1245    BNP    Component Value Date/Time   BNP 167.5 (H) 12/30/2015 1454    ProBNP    Component Value Date/Time   PROBNP 3,682.0 (H) 03/31/2013 0515    Imaging: Ct Chest Wo Contrast  Result Date: 07/09/2018 CLINICAL DATA:  Swelling and drainage along anterior wall, evaluate for protrusion of sternal wires versus deep infection EXAM: CT CHEST WITHOUT CONTRAST TECHNIQUE: Multidetector CT imaging of the chest was performed following the standard protocol without IV contrast. COMPARISON:  CT chest dated 12/27/2015. Chest radiographs dated 03/25/2018. FINDINGS: Cardiovascular: Heart is normal in size. No pericardial effusion. Prosthetic mitral valve. Leftward cardiomediastinal shift. No evidence of thoracic aortic aneurysm. Atherosclerotic calcifications of the aortic arch. Mild coronary atherosclerosis of the right coronary artery. Mediastinum/Nodes: No suspicious mediastinal lymphadenopathy. Visualized thyroid is unremarkable. Lungs/Pleura: Moderate centrilobular and paraseptal emphysematous changes, upper lobe predominant. Status post left lower lobectomy. Stable complex fluid at the left lung base. Volume loss in the left hemithorax. Mild biapical pleural-parenchymal scarring. No focal consolidation. No suspicious pulmonary nodules. No pneumothorax. Upper Abdomen: Visualized upper abdomen is notable for bilateral renal cysts and vascular calcifications. Musculoskeletal: No evidence of sternal fracture. Sternal wires appear intact. No fluid collections deep to the sternum to suggest mediastinitis. The patient's 5th sternal wire protrudes anteriorly towards the skin surface (sagittal image 79), unchanged from prior chest radiographs, with associated adjacent cutaneous skin  thickening suggesting mild cellulitis (series 2/image 64). No drainable fluid collection/abscess. IMPRESSION: A sternal wire protrudes anteriorly toward the skin surface, as described above, with associated mild cellulitis. No drainable fluid collection/abscess. Additional stable ancillary findings as above.  Aortic Atherosclerosis (ICD10-I70.0) and Emphysema (ICD10-J43.9). Electronically Signed   By: Julian Hy M.D.   On: 07/09/2018 16:00     Assessment & Plan:  73 year old male, former smoker. PMH significant for COPD with emphysema. Presents today for annual follow-up, needs refill of Spiriva. No acute complaints. Experiences shortness of breath with long walks but recovers quickly. Has dry cough with no production. Appears well, exam benign. Refill for Spiriva already sent. Follow up annually.   COPD with emphysema ([place]) - Remains stable  - Continue Spiriva 1 capsule daily - Instructed to return if develops increase cough with colored mucus production, increase shortness of breath or wheezing  - Received high dose influenza vaccine 08/05/2018 - Follow up annually    Martyn Ehrich, NP 08/06/2018

## 2018-08-05 NOTE — Patient Instructions (Addendum)
Looked well today  Continue Spiriva - take 1 capsule daily  Return if you develop increase cough with colored mucus production, increase shortness of breath or wheezing   Follow up: Return in 1 year, needs new pulmonary doctor (Previous [facility] patient)

## 2018-08-06 ENCOUNTER — Encounter: Payer: Self-pay | Admitting: Primary Care

## 2018-08-06 NOTE — Assessment & Plan Note (Addendum)
-   Remains stable  - Continue Spiriva 1 capsule daily - Instructed to return if develops increase cough with colored mucus production, increase shortness of breath or wheezing  - Received high dose influenza vaccine 08/05/2018 - Follow up annually

## 2018-08-08 ENCOUNTER — Encounter: Payer: Self-pay | Admitting: Adult Health

## 2018-08-08 ENCOUNTER — Ambulatory Visit (INDEPENDENT_AMBULATORY_CARE_PROVIDER_SITE_OTHER): Payer: Medicare HMO | Admitting: Adult Health

## 2018-08-08 VITALS — BP 140/80 | Temp 98.2°F | Wt 149.0 lb

## 2018-08-08 DIAGNOSIS — Z4802 Encounter for removal of sutures: Secondary | ICD-10-CM

## 2018-08-08 NOTE — Progress Notes (Signed)
Subjective:    Patient ID: Jeffery Copeland, male    DOB: 1945/11/05, 73 y.o.   MRN: [id number]  HPI 73 year old male who  has a past medical history of Acute and subacute bacterial endocarditis (1999), Atrial fibrillation ([place]) (11/11/2008), CARDIOMYOPATHY (10/20/2008), Cerebrovascular disease, unspecified, Closed right acetabular fracture ([place]), COPD (chronic obstructive pulmonary disease) ([place]) (1999), Epidural hematoma ([place]) (03/01/2011), Esophageal reflux, Generalized osteoarthrosis, unspecified site, History of tobacco abuse, Hypertension, HYPERTENSION, BENIGN (10/20/2008), Hyponatremia, Insomnia, Iron deficiency anemia, unspecified, ISCHEMIC COLITIS, HX OF (10/20/2008), Lung nodules (2008, 2014), Prostate cancer ([place]) (2015), Protruding sternal wires, S/P mitral valve replacement (02/01/1998), S/P mitral valve replacement with bioprosthetic valve (03/24/2013), Seizures ([place]), Severe mitral regurgitation (02/20/2013), and Unsteady gait.  He presents to the office today for suture removal. He had two sutures placed in his sternum on December 12th 2019 after minor surgery was performed to remove protruding sternal wires.   He denies any signs or symptoms of infection. He has been cutting the tails of the suturing material at home because they were poking through his shirt.    Review of Systems See HPI   Past Medical History:  Diagnosis Date  . Acute and subacute bacterial endocarditis 1999   group G Streptococcus  . Atrial fibrillation ([place]) 11/11/2008   Qualifier: Diagnosis of  By: Lia Foyer, MD, Jaquelyn Bitter   . CARDIOMYOPATHY 10/20/2008   Qualifier: Diagnosis of  By: Owens Shark, RN, BSN, Lauren    . Cerebrovascular disease, unspecified   . Closed right acetabular fracture ([place])   . COPD (chronic obstructive pulmonary disease) ([place]) 1999  . Epidural hematoma ([place]) 03/01/2011   Recent fall 2012   . Esophageal reflux   . Generalized osteoarthrosis, unspecified site   . History of tobacco abuse   .  Hypertension   . HYPERTENSION, BENIGN 10/20/2008   Qualifier: Diagnosis of  By: Owens Shark, RN, BSN, Lauren    . Hyponatremia   . Insomnia   . Iron deficiency anemia, unspecified   . ISCHEMIC COLITIS, HX OF 10/20/2008   Qualifier: Diagnosis of  By: Owens Shark, RN, BSN, Lauren    . Lung nodules 2008, 2014   LLL nodule removed 2008 with LLL lobectomy,  RLL nodule 40mm observation  . Prostate cancer ([place]) 2015  . Protruding sternal wires   . S/P mitral valve replacement 02/01/1998   Carpentier-Edwards porcine bioprosthetic tissue valve, size 14mm Placed for acute and subacute bacterial endocarditis (group G Streptococcus) with pre-existing mitral valve prolapse   . S/P mitral valve replacement with bioprosthetic valve 03/24/2013   Redo mitral valve replacement using 90mm [facility] mitral bovine bioprosthetic tissue valve performed via right mini thoracotomy  . Seizures ([place])    last seizure 1 year ago  . Severe mitral regurgitation 02/20/2013  . Unsteady gait     Social History   Socioeconomic History  . Marital status: Divorced    Spouse name: Not on file  . Number of children: 2  . Years of education: 9  . Highest education level: Not on file  Occupational History  . Occupation: disabled  Social Needs  . Financial resource strain: Not on file  . Food insecurity:    Worry: Not on file    Inability: Not on file  . Transportation needs:    Medical: Not on file    Non-medical: Not on file  Tobacco Use  . Smoking status: Former Smoker    Packs/day: 2.00    Years: 45.00    Pack years:  90.00    Types: Cigarettes    Last attempt to quit: 07/30/2006    Years since quitting: 12.0  . Smokeless tobacco: Never Used  Substance and Sexual Activity  . Alcohol use: No    Alcohol/week: 0.0 standard drinks    Comment: previous alcohol use & home-made alcohol consumption  . Drug use: No  . Sexual activity: Not Currently  Lifestyle  . Physical activity:    Days per week: Not on file    Minutes  per session: Not on file  . Stress: Not on file  Relationships  . Social connections:    Talks on phone: Not on file    Gets together: Not on file    Attends religious service: Not on file    Active member of club or organization: Not on file    Attends meetings of clubs or organizations: Not on file    Relationship status: Not on file  . Intimate partner violence:    Fear of current or ex partner: Not on file    Emotionally abused: Not on file    Physically abused: Not on file    Forced sexual activity: Not on file  Other Topics Concern  . Not on file  Social History Narrative   Patient is divorced and has 2 children.   Patient is right handed.   Patient has 9 th grade education.    Patient drinks diet sodas.      [facility]:   From [place] originally. Always lived in [place]. 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 Previously traveled to [place], [place], & [place]. No pets currently. Remote pet owl and parakeet exposure. No mold exposure. Enjoys fishing.     Past Surgical History:  Procedure Laterality Date  . CARDIAC VALVE REPLACEMENT    . INTRAOPERATIVE TRANSESOPHAGEAL ECHOCARDIOGRAM N/A 03/24/2013   Procedure: INTRAOPERATIVE TRANSESOPHAGEAL ECHOCARDIOGRAM;  Surgeon: Rexene Alberts, MD;  Location: [place];  Service: Open Heart Surgery;  Laterality: N/A;  . MINIMALLY INVASIVE MAZE PROCEDURE N/A 03/24/2013   Procedure: MINIMALLY INVASIVE MAZE PROCEDURE;  Surgeon: Rexene Alberts, MD;  Location: [facility];  Service: Open Heart Surgery;  Laterality: N/A;  . MITRAL VALVE REPLACEMENT  02/01/1998   Carpentier-Edwards porcine bioprosthetic tissue valve, size 3mm, placed for complicated bacterial endocarditis  . MITRAL VALVE REPLACEMENT Right 03/24/2013   Procedure: MINIMALLY INVASIVE REDO MITRAL VALVE (MV) REPLACEMENT;  Surgeon: Rexene Alberts, MD;  Location: [facility];  Service: Open Heart Surgery;  Laterality: Right;  . PROSTATE BIOPSY    . radiation treatment tx for  prostate cancer  2015  . STERNAL WIRES REMOVAL N/A 07/10/2018   Procedure: STERNAL WIRES REMOVAL;  Surgeon: Rexene Alberts, MD;  Location: [facility];  Service: Thoracic;  Laterality: N/A;  . TEE WITHOUT CARDIOVERSION N/A 02/12/2013   Procedure: TRANSESOPHAGEAL ECHOCARDIOGRAM (TEE);  Surgeon: Fay Records, MD;  Location[facility] ENDOSCOPY;  Service: Cardiovascular;  Laterality: N/A;  . VIDEO ASSISTED THORACOSCOPY  04/29/2007   Left VATS w/ mini thoracotomy for Left Lower Lobectomy for benign lung nodules    Family History  Problem Relation Age of Onset  . Cancer Father        prostate cancer  . Tuberculosis Father   . Heart attack Father   . Cancer Brother        prostate cancer  . Arthritis Mother   . Lung disease Neg Hx     Allergies  Allergen Reactions  . Ace Inhibitors Swelling  .  Lisinopril     Seizures   . Rosuvastatin Other (See Comments)    Muscle aches    Current Outpatient Medications on File Prior to Visit  Medication Sig Dispense Refill  . Cholecalciferol (VITAMIN D3) 1000 UNITS CAPS Take 1,000 Units by mouth daily.     [place] Kitchen ezetimibe (ZETIA) 10 MG tablet Take 1 tablet (10 mg total) by mouth daily. 90 tablet 3  . levETIRAcetam (KEPPRA) 500 MG tablet Take 1 tablet (500 mg total) by mouth 2 (two) times daily. 180 tablet 3  . metoprolol tartrate (LOPRESSOR) 25 MG tablet TAKE 1/2 TABLET TWICE DAILY (Patient taking differently: Take 12.5 mg by mouth 2 (two) times daily. ) 90 tablet 2  . phenytoin (DILANTIN) 100 MG ER capsule TAKE 3 CAPSULES (300MG ) AT BEDTIME (Patient taking differently: Take 300 mg by mouth at bedtime. ) 270 capsule 3  . temazepam (RESTORIL) 15 MG capsule Take 15 mg by mouth at bedtime as needed for sleep.   3  . tiotropium (SPIRIVA HANDIHALER) 18 MCG inhalation capsule INHALE THE CONTENTS OF 1 CAPSULE EVERY DAY VIA HANDIHALER 90 capsule 3  . tiZANidine (ZANAFLEX) 4 MG tablet TAKE 1 TABLET EVERY 6 HOURS AS NEEDED FOR MUSCLE SPASMS (Patient taking differently: Take  4 mg by mouth every 6 (six) hours as needed for muscle spasms. ) 360 tablet 1  . traMADol (ULTRAM) 50 MG tablet TAKE 1 TABLET EVERY 8 HOURS IF NEEDED 90 tablet 1  . traMADol (ULTRAM) 50 MG tablet Take 1 tablet (50 mg total) by mouth every 8 (eight) hours as needed for up to 10 days. 30 tablet 0  . warfarin (COUMADIN) 6 MG tablet Take As Directed 30 tablet 3   No current facility-administered medications on file prior to visit.     BP 140/80   Temp 98.2 F (36.8 C)   Wt 149 lb (67.6 kg)   BMI 25.58 kg/m       Objective:   Physical Exam Vitals signs reviewed.  Constitutional:      Appearance: Normal appearance.  Skin:    General: Skin is warm and dry.     Comments: Well healed surgical wound. Two sutures noted. No signs of infection present   Neurological:     Mental Status: He is alert.       Assessment & Plan:  1. Encounter for removal of sutures - Sutures removed and patient tolerated procedure well  - Advised close monitoring at home and follow up with signs of infection   Dorothyann Peng, NP

## 2018-08-18 ENCOUNTER — Ambulatory Visit (INDEPENDENT_AMBULATORY_CARE_PROVIDER_SITE_OTHER): Payer: Medicare HMO | Admitting: Pharmacist

## 2018-08-18 DIAGNOSIS — I4891 Unspecified atrial fibrillation: Secondary | ICD-10-CM | POA: Diagnosis not present

## 2018-08-18 DIAGNOSIS — Z5181 Encounter for therapeutic drug level monitoring: Secondary | ICD-10-CM | POA: Diagnosis not present

## 2018-08-18 LAB — POCT INR: INR: 3 (ref 2.0–3.0)

## 2018-08-18 NOTE — Patient Instructions (Signed)
Description   Continue taking 1 tablet daily except 1/2 tablet on Mondays, Wednesdays and Fridays.  Recheck INR in 4 weeks.

## 2018-08-22 ENCOUNTER — Other Ambulatory Visit: Payer: Self-pay | Admitting: Adult Health

## 2018-08-22 DIAGNOSIS — Z760 Encounter for issue of repeat prescription: Secondary | ICD-10-CM

## 2018-09-15 ENCOUNTER — Ambulatory Visit (INDEPENDENT_AMBULATORY_CARE_PROVIDER_SITE_OTHER): Payer: Medicare HMO

## 2018-09-15 DIAGNOSIS — I4891 Unspecified atrial fibrillation: Secondary | ICD-10-CM

## 2018-09-15 DIAGNOSIS — Z5181 Encounter for therapeutic drug level monitoring: Secondary | ICD-10-CM | POA: Diagnosis not present

## 2018-09-15 LAB — POCT INR: INR: 3.7 — AB (ref 2.0–3.0)

## 2018-09-15 NOTE — Patient Instructions (Signed)
Description   Hold Coumadin to day then continue taking 1 tablet daily except 1/2 tablet on Mondays, Wednesdays and Fridays.  Recheck INR in 3 weeks.

## 2018-09-26 ENCOUNTER — Other Ambulatory Visit: Payer: Self-pay | Admitting: Adult Health

## 2018-09-26 MED ORDER — TRAMADOL HCL 50 MG PO TABS: ORAL_TABLET | ORAL | 1 refills | Status: DC

## 2018-09-26 NOTE — Telephone Encounter (Signed)
Copied from [place] [id number]. Topic: Quick Communication - Rx Refill/Question >> Sep 26, 2018 11:24 AM Rayann Heman wrote: Medication: traMADol (ULTRAM) 50 MG tablet [[id number]]   Has the patient contacted their pharmacy? yes Preferred Pharmacy (with phone number or street name): [id number] [place], [place] [phone] (Phone) [phone] (Fax)    Agent: Please be advised that RX refills may take up to 3 business days. We ask that you follow-up with your pharmacy.

## 2018-10-06 ENCOUNTER — Ambulatory Visit (INDEPENDENT_AMBULATORY_CARE_PROVIDER_SITE_OTHER): Payer: Medicare HMO | Admitting: Pharmacist

## 2018-10-06 DIAGNOSIS — I4891 Unspecified atrial fibrillation: Secondary | ICD-10-CM

## 2018-10-06 DIAGNOSIS — Z5181 Encounter for therapeutic drug level monitoring: Secondary | ICD-10-CM | POA: Diagnosis not present

## 2018-10-06 LAB — POCT INR: INR: 2.8 (ref 2.0–3.0)

## 2018-10-06 NOTE — Patient Instructions (Signed)
Continue taking 1 tablet daily except 1/2 tablet on Mondays, Wednesdays and Fridays.  Recheck INR in 3 weeks.

## 2018-10-25 ENCOUNTER — Other Ambulatory Visit: Payer: Self-pay | Admitting: Adult Health

## 2018-10-25 DIAGNOSIS — Z760 Encounter for issue of repeat prescription: Secondary | ICD-10-CM

## 2018-10-27 ENCOUNTER — Ambulatory Visit: Payer: Medicare HMO | Admitting: Adult Health

## 2018-10-27 NOTE — Telephone Encounter (Signed)
Sent to the pharmacy by e-scribe. 

## 2018-10-28 ENCOUNTER — Telehealth: Payer: Self-pay | Admitting: Pharmacist

## 2018-10-28 NOTE — Telephone Encounter (Signed)
Called pt to prescreen for visit tomorrow, however pt comprehension was poor and he asked me to communicate with his sister Altha Harm, [phone]. Will also review dosing instructions with her tomorrow after INR is checked.  1. Do you currently have a fever? No 2. Have you recently travelled on a cruise, internationally, or to [place], [place], [place], [place], [place], or [place], [place] [employer]) ? No 3. Have you been in contact with someone that is currently pending confirmation of Covid19 testing or has been confirmed to have the [place] virus? No 4. Are you currently experiencing fatigue or cough? No  Pt. Advised that we are restricting visitors at this time and anyone present in the vehicle should meet the above criteria as well. Advised that visit will be at curbside for finger stick ONLY and will receive call with instructions. Pt also advised to please bring own pen for signature of arrival document.

## 2018-10-29 ENCOUNTER — Ambulatory Visit (INDEPENDENT_AMBULATORY_CARE_PROVIDER_SITE_OTHER): Payer: Medicare HMO | Admitting: Pharmacist

## 2018-10-29 ENCOUNTER — Other Ambulatory Visit: Payer: Self-pay

## 2018-10-29 DIAGNOSIS — I4891 Unspecified atrial fibrillation: Secondary | ICD-10-CM

## 2018-10-29 DIAGNOSIS — Z5181 Encounter for therapeutic drug level monitoring: Secondary | ICD-10-CM | POA: Diagnosis not present

## 2018-10-29 LAB — POCT INR: INR: 4.1 — AB (ref 2.0–3.0)

## 2018-11-03 ENCOUNTER — Telehealth: Payer: Self-pay | Admitting: Adult Health

## 2018-11-03 ENCOUNTER — Other Ambulatory Visit: Payer: Self-pay

## 2018-11-03 ENCOUNTER — Ambulatory Visit (INDEPENDENT_AMBULATORY_CARE_PROVIDER_SITE_OTHER): Payer: Medicare HMO | Admitting: Adult Health

## 2018-11-03 ENCOUNTER — Ambulatory Visit: Payer: Medicare HMO | Admitting: Neurology

## 2018-11-03 ENCOUNTER — Encounter: Payer: Self-pay | Admitting: Adult Health

## 2018-11-03 DIAGNOSIS — Z8673 Personal history of transient ischemic attack (TIA), and cerebral infarction without residual deficits: Secondary | ICD-10-CM

## 2018-11-03 DIAGNOSIS — Z5181 Encounter for therapeutic drug level monitoring: Secondary | ICD-10-CM | POA: Diagnosis not present

## 2018-11-03 DIAGNOSIS — R569 Unspecified convulsions: Secondary | ICD-10-CM | POA: Diagnosis not present

## 2018-11-03 NOTE — Progress Notes (Signed)
[name] [address] [name] [address] [phone]     Virtual Visit via Telephone Note  I connected with [name] on 11/03/18 at 12:45 PM EDT by telephone located at [facility] and verified that I am speaking with the correct person using two identifiers who reports being located at his own home.  Medication list, medical history and allergy list updated per patient report.   I discussed the limitations, risks, security and privacy concerns of performing an evaluation and management service by telephone and the availability of in person appointments. I also discussed with the patient that there may be a patient responsible charge related to this service. The patient expressed understanding and agreed to proceed.   History of Present Illness:  Jeffery Copeland is a 73 y.o. male who continues to be followed in this office for seizure management with underlying history of stroke.  He was initially scheduled for face-to-face office visit today at this time but due to [place], face-to-face office visit rescheduled for non-face-to-face telephone visit.  Mr. [name] continues to be followed in this office for seizure and medication management with one-year follow-up visit scheduled today.  He continues on Keppra and Dilantin which she tolerates well without any recurrent seizure activity.  At prior visit, 10/22/2017, lab work obtained which were all satisfactory.  He continues to complete all ADLs and IADLs independently but he does not drive at this time.  He continues on warfarin without side effects of bleeding or bruising with most recent INR level elevated at 4.1 with goal 2.5-3.5. He plans on obtaining repeat level on 11/17/18.  He continues to receive routine lab work and management through anticoagulation clinic.  Denies any new or recurrent stroke/TIA symptoms.   Observations/Objective:  *Exam limited due to visit type*  General:  Pleasant elderly Caucasian male answering and asking questions appropriately throughout conversation  Phenytoin level, total: 7.6 (10/22/2017  POCT INR:  10/29/2018: 4.1 10/06/2018: 2.8 09/15/2018: 3.7    CBC    Component Value Date/Time   WBC 4.7 07/10/2018 1245   RBC 3.87 (L) 07/10/2018 1245   HGB 12.1 (L) 07/10/2018 1245   HGB 12.2 (L) 10/22/2017 1050   HCT 39.3 07/10/2018 1245   HCT 36.3 (L) 10/22/2017 1050   PLT 183 07/10/2018 1245   PLT 199 10/22/2017 1050   MCV 101.6 (H) 07/10/2018 1245   MCV 97 10/22/2017 1050   MCH 31.3 07/10/2018 1245   MCHC 30.8 07/10/2018 1245   RDW 13.0 07/10/2018 1245   RDW 13.6 10/22/2017 1050   LYMPHSABS 0.8 03/25/2018 1604   LYMPHSABS 0.6 (L) 10/22/2017 1050   MONOABS 1.0 03/25/2018 1604   EOSABS 0.1 03/25/2018 1604   EOSABS 0.1 10/22/2017 1050   BASOSABS 0.1 03/25/2018 1604   BASOSABS 0.0 10/22/2017 1050   CMP     Component Value Date/Time   NA 142 07/10/2018 1245   NA 139 10/22/2017 1050   K 3.9 07/10/2018 1245   CL 108 07/10/2018 1245   CO2 22 07/10/2018 1245   GLUCOSE 90 07/10/2018 1245   BUN 11 07/10/2018 1245   BUN 6 (L) 10/22/2017 1050   CREATININE 1.25 (H) 07/10/2018 1245   CREATININE 1.07 08/12/2015 1500   CALCIUM 9.0 07/10/2018 1245   PROT 6.5 07/10/2018 1245   PROT 6.9 02/28/2018 0948   ALBUMIN 4.1 07/10/2018 1245   ALBUMIN 4.3 02/28/2018 0948   AST 25 07/10/2018 1245   ALT 37 07/10/2018 1245  ALKPHOS 89 07/10/2018 1245   BILITOT 0.8 07/10/2018 1245   BILITOT 0.4 02/28/2018 0948   GFRNONAA 57 (L) 07/10/2018 1245   GFRAA >60 07/10/2018 1245      Assessment and Plan:  Jeffery Copeland is a 73 y.o. male with underlying medical history of atrial fibrillation, cardiomyopathy, stroke, COPD, HTN, prostate cancer, mitral valve replacement and seizures.  He continues to be followed in this office for seizure and medication management.  He continues to do well with current use of Keppra and Dilantin tolerating well  without any seizure activity.  He also remains on warfarin with management of INR levels by anticoagulation clinic.  Recommend obtaining lab work to ensure satisfactory levels with use of AED.  At this time, continue current dose of Keppra and Dilantin. I will reach out to anticoag clinic to see if there are able to obtain phenytoin level while he is already having his PT/INR level obtained.  If unable to, patient was advised that we will check levels over the next few months as levels have been stable over numerous years and recommended to only leave his home if needed.  He verbalized understanding and is in agreement to this plan.   Follow Up Instructions:   He will follow-up in 1 year or call earlier if needed    I discussed the assessment and treatment plan with the patient.  The patient was provided an opportunity to ask questions and all were answered to their satisfaction. The patient agreed with the plan and verbalized an understanding of the instructions.   I provided 11 minutes of non-face-to-face time during this encounter.    Venancio Poisson, AGNP-BC  [facility] [address] [place], [place] [address]  Phone [phone] Fax [phone] Note: This document was prepared with digital dictation and possible smart phrase technology. Any transcriptional errors that result from this process are unintentional.

## 2018-11-03 NOTE — Telephone Encounter (Signed)
Call patient in regards to obtaining lab work.  As he has been stable on current medication dosages, he would prefer if lab work could be obtained once it is safe for to be out of his home due to COVID-19 pandemic.  We will obtain lab work hopefully around [place] area and will call patient to remind him of this.  He verbalized understanding and is in agreement to this plan.  He plans on still obtaining INR levels on 11/19/2018 in anticoagulation clinic which he feels as though he can continue as they are obtaining samples with him staying in his car to limit exposure.  He was advised to follow-up sooner with any symptoms or concerns regarding seizure activity or medications.

## 2018-11-09 NOTE — Progress Notes (Signed)
I agree with the above plan 

## 2018-11-17 ENCOUNTER — Telehealth: Payer: Self-pay

## 2018-11-17 NOTE — Telephone Encounter (Signed)
Called to screen pt for drive thru and the pt stated that they had no transportation and wanted to cancel

## 2018-11-19 ENCOUNTER — Other Ambulatory Visit: Payer: Self-pay | Admitting: Adult Health

## 2018-12-02 ENCOUNTER — Telehealth: Payer: Self-pay | Admitting: Adult Health

## 2018-12-02 NOTE — Telephone Encounter (Signed)
Please refill.

## 2018-12-02 NOTE — Telephone Encounter (Signed)
Copied from [place] [id number]. Topic: Quick Communication - Rx Refill/Question >> Dec 02, 2018  9:57 AM Sheran Luz wrote: Medication:warfarin (COUMADIN) 6 MG tablet and traMADol (ULTRAM) 50 MG tablet    Patient is requesting refills. He states that when Tramadol was last sent in pharmacy only allowed him to pick up 30 tablets.    Preferred Pharmacy (with phone number or street name):[id number] [place] [address], [place] [phone] (Phone) [phone] (Fax)

## 2018-12-02 NOTE — Telephone Encounter (Signed)
Pt overdue for INR check. LMOM to schedule f/u appt.

## 2018-12-02 NOTE — Telephone Encounter (Signed)
Called and spoke to the pharmacy.  Pt has refills on file of tramadol.  Will forward to [facility] for coumadin refill.

## 2018-12-03 NOTE — Telephone Encounter (Signed)
Called the pt and spoke with him regarding needing an appt. He stated I know but that SC[facility] bus!!! Then he said nothing else, I said so Mr. Pankow are you out of your Coumadin/Warfarin and he said almost, I said what day can I schedule you an appt so you can have INR checked and we can fill the med and he said let me speak with my sister and see when she can bring me. Pt is aware at this time we are still having the drive up service and gave him the number in the Clinic to call back to schedule since we are doing special hours and he will need an appt. He said I will call you back and he repeated the office number and I confirmed it. Will await for the pt to call back.

## 2018-12-03 NOTE — Telephone Encounter (Signed)
LMOM to follow up 

## 2018-12-04 NOTE — Telephone Encounter (Signed)
Returned Licensed conveyancer. He requested appointment for Friday. I have added him to schedule and also requested that when he is done with the INR check, he come inside to [facility] to have labs done per Neurology request.

## 2018-12-05 ENCOUNTER — Other Ambulatory Visit: Payer: Self-pay

## 2018-12-05 ENCOUNTER — Ambulatory Visit (INDEPENDENT_AMBULATORY_CARE_PROVIDER_SITE_OTHER): Payer: Medicare HMO | Admitting: Pharmacist

## 2018-12-05 DIAGNOSIS — I4891 Unspecified atrial fibrillation: Secondary | ICD-10-CM | POA: Diagnosis not present

## 2018-12-05 DIAGNOSIS — Z5181 Encounter for therapeutic drug level monitoring: Secondary | ICD-10-CM

## 2018-12-05 LAB — POCT INR: INR: 3 (ref 2.0–3.0)

## 2018-12-08 MED ORDER — WARFARIN SODIUM 6 MG PO TABS: ORAL_TABLET | ORAL | 1 refills | Status: DC

## 2018-12-08 NOTE — Patient Instructions (Addendum)
Description   Spoke with pt and instructed to continue taking 1 tablet daily except 1/2 tablet on Mondays, Wednesdays and Fridays.  Recheck INR in 4 weeks.

## 2018-12-10 ENCOUNTER — Other Ambulatory Visit: Payer: Self-pay | Admitting: Adult Health

## 2018-12-11 NOTE — Telephone Encounter (Signed)
Sent to the pharmacy by e-scribe. 

## 2018-12-22 ENCOUNTER — Other Ambulatory Visit: Payer: Self-pay | Admitting: Adult Health

## 2018-12-22 DIAGNOSIS — Z760 Encounter for issue of repeat prescription: Secondary | ICD-10-CM

## 2018-12-24 NOTE — Telephone Encounter (Signed)
Sent to the pharmacy by e-scribe for 90 days. 

## 2018-12-30 ENCOUNTER — Telehealth: Payer: Self-pay

## 2018-12-30 NOTE — Telephone Encounter (Signed)

## 2019-01-02 ENCOUNTER — Other Ambulatory Visit: Payer: Self-pay

## 2019-01-02 ENCOUNTER — Ambulatory Visit (INDEPENDENT_AMBULATORY_CARE_PROVIDER_SITE_OTHER): Payer: Medicare HMO | Admitting: *Deleted

## 2019-01-02 DIAGNOSIS — Z5181 Encounter for therapeutic drug level monitoring: Secondary | ICD-10-CM

## 2019-01-02 DIAGNOSIS — I4891 Unspecified atrial fibrillation: Secondary | ICD-10-CM | POA: Diagnosis not present

## 2019-01-02 LAB — POCT INR: INR: 3.2 — AB (ref 2.0–3.0)

## 2019-01-02 NOTE — Patient Instructions (Signed)
Description   Continue taking 1 tablet daily except 1/2 tablet on Mondays, Wednesdays and Fridays.  Recheck INR in 5 weeks.

## 2019-01-15 ENCOUNTER — Other Ambulatory Visit: Payer: Self-pay

## 2019-01-15 MED ORDER — WARFARIN SODIUM 6 MG PO TABS: ORAL_TABLET | ORAL | 0 refills | Status: DC

## 2019-01-26 ENCOUNTER — Telehealth: Payer: Self-pay

## 2019-01-26 NOTE — Telephone Encounter (Signed)

## 2019-01-29 DIAGNOSIS — Z8546 Personal history of malignant neoplasm of prostate: Secondary | ICD-10-CM | POA: Diagnosis not present

## 2019-02-04 ENCOUNTER — Other Ambulatory Visit: Payer: Self-pay | Admitting: Neurology

## 2019-02-04 ENCOUNTER — Other Ambulatory Visit: Payer: Self-pay | Admitting: Adult Health

## 2019-02-04 DIAGNOSIS — Z760 Encounter for issue of repeat prescription: Secondary | ICD-10-CM

## 2019-02-05 ENCOUNTER — Other Ambulatory Visit: Payer: Self-pay | Admitting: Family Medicine

## 2019-02-05 MED ORDER — TRAMADOL HCL 50 MG PO TABS: ORAL_TABLET | ORAL | 1 refills | Status: DC

## 2019-02-05 NOTE — Telephone Encounter (Signed)
Sent to the pharmacy by e-scribe. 

## 2019-02-06 ENCOUNTER — Encounter: Payer: Self-pay | Admitting: Adult Health

## 2019-02-06 ENCOUNTER — Ambulatory Visit (INDEPENDENT_AMBULATORY_CARE_PROVIDER_SITE_OTHER): Payer: Medicare HMO | Admitting: Interventional Cardiology

## 2019-02-06 ENCOUNTER — Ambulatory Visit (INDEPENDENT_AMBULATORY_CARE_PROVIDER_SITE_OTHER): Payer: Medicare HMO | Admitting: Adult Health

## 2019-02-06 ENCOUNTER — Other Ambulatory Visit: Payer: Self-pay

## 2019-02-06 VITALS — BP 134/70 | HR 78 | Temp 97.4°F | Wt 136.2 lb

## 2019-02-06 DIAGNOSIS — Z5181 Encounter for therapeutic drug level monitoring: Secondary | ICD-10-CM

## 2019-02-06 DIAGNOSIS — Z0000 Encounter for general adult medical examination without abnormal findings: Secondary | ICD-10-CM | POA: Diagnosis not present

## 2019-02-06 DIAGNOSIS — R569 Unspecified convulsions: Secondary | ICD-10-CM

## 2019-02-06 DIAGNOSIS — I10 Essential (primary) hypertension: Secondary | ICD-10-CM

## 2019-02-06 DIAGNOSIS — Z7901 Long term (current) use of anticoagulants: Secondary | ICD-10-CM

## 2019-02-06 DIAGNOSIS — I4891 Unspecified atrial fibrillation: Secondary | ICD-10-CM

## 2019-02-06 DIAGNOSIS — E782 Mixed hyperlipidemia: Secondary | ICD-10-CM | POA: Diagnosis not present

## 2019-02-06 DIAGNOSIS — J438 Other emphysema: Secondary | ICD-10-CM

## 2019-02-06 LAB — LIPID PANEL
Cholesterol: 137 mg/dL (ref 0–200)
HDL: 61.9 mg/dL (ref >39.00)
LDL Cholesterol: 62 mg/dL (ref 0–99)
NonHDL: 74.82
Total CHOL/HDL Ratio: 2
Triglycerides: 63 mg/dL (ref 0.0–149.0)
VLDL: 12.6 mg/dL (ref 0.0–40.0)

## 2019-02-06 LAB — COMPREHENSIVE METABOLIC PANEL
ALT: 22 U/L (ref 0–53)
AST: 19 U/L (ref 0–37)
Albumin: 4.4 g/dL (ref 3.5–5.2)
Alkaline Phosphatase: 131 U/L — ABNORMAL HIGH (ref 39–117)
BUN: 6 mg/dL (ref 6–23)
CO2: 28 mEq/L (ref 19–32)
Calcium: 9 mg/dL (ref 8.4–10.5)
Chloride: 101 mEq/L (ref 96–112)
Creatinine, Ser: 1.06 mg/dL (ref 0.40–1.50)
GFR: 68.48 mL/min (ref >60.00)
Glucose, Bld: 86 mg/dL (ref 70–99)
Potassium: 4.5 mEq/L (ref 3.5–5.1)
Sodium: 138 mEq/L (ref 135–145)
Total Bilirubin: 0.6 mg/dL (ref 0.2–1.2)
Total Protein: 6.6 g/dL (ref 6.0–8.3)

## 2019-02-06 LAB — CBC WITH DIFFERENTIAL/PLATELET
Basophils Absolute: 0.1 10*3/uL (ref 0.0–0.1)
Basophils Relative: 1 % (ref 0.0–3.0)
Eosinophils Absolute: 0.1 10*3/uL (ref 0.0–0.7)
Eosinophils Relative: 1.7 % (ref 0.0–5.0)
HCT: 38.3 % — ABNORMAL LOW (ref 39.0–52.0)
Hemoglobin: 12.7 g/dL — ABNORMAL LOW (ref 13.0–17.0)
Lymphocytes Relative: 15.7 % (ref 12.0–46.0)
Lymphs Abs: 0.9 10*3/uL (ref 0.7–4.0)
MCHC: 33 g/dL (ref 30.0–36.0)
MCV: 98 fl (ref 78.0–100.0)
Monocytes Absolute: 0.8 10*3/uL (ref 0.1–1.0)
Monocytes Relative: 13.8 % — ABNORMAL HIGH (ref 3.0–12.0)
Neutro Abs: 3.7 10*3/uL (ref 1.4–7.7)
Neutrophils Relative %: 67.8 % (ref 43.0–77.0)
Platelets: 178 10*3/uL (ref 150.0–400.0)
RBC: 3.91 Mil/uL — ABNORMAL LOW (ref 4.22–5.81)
RDW: 13.4 % (ref 11.5–15.5)
WBC: 5.5 10*3/uL (ref 4.0–10.5)

## 2019-02-06 LAB — TSH: TSH: 1.36 u[IU]/mL (ref 0.35–4.50)

## 2019-02-06 LAB — POCT INR: INR: 3.7 — AB (ref 2.0–3.0)

## 2019-02-06 LAB — PSA: PSA: 0.02 ng/mL — ABNORMAL LOW (ref 0.10–4.00)

## 2019-02-06 NOTE — Progress Notes (Signed)
Subjective:    Patient ID: Jeffery Copeland, male    DOB: [birth year], 73 y.o.   MRN: [id number]  HPI Patient presents for yearly preventative medicine examination. He is a pleasant 73 year old male who  has a past medical history of Acute and subacute bacterial endocarditis (1999), Atrial fibrillation ([place]) (11/11/2008), CARDIOMYOPATHY (10/20/2008), Cerebrovascular disease, unspecified, Closed right acetabular fracture ([place]), COPD (chronic obstructive pulmonary disease) ([place]) (1999), Epidural hematoma ([place]) (03/01/2011), Esophageal reflux, Generalized osteoarthrosis, unspecified site, History of tobacco abuse, Hypertension, HYPERTENSION, BENIGN (10/20/2008), Hyponatremia, Insomnia, Iron deficiency anemia, unspecified, ISCHEMIC COLITIS, HX OF (10/20/2008), Lung nodules (2008, 2014), Prostate cancer ([place]) (2015), Protruding sternal wires, S/P mitral valve replacement (02/01/1998), S/P mitral valve replacement with bioprosthetic valve (03/24/2013), Seizures ([place]), Severe mitral regurgitation (02/20/2013), and Unsteady gait.  COPD with emphysema and lung nodule.  Is followed by pulmonary.  Currently uses Spiriva inhaler daily.  Feels well controlled  Seizure Disorder -managed with Keppra 500 mg twice daily and Dilantin 300 mg daily.  He is managed by neurology.  Is tolerating well and denies any recent seizure activity.  A Fib/Mitral Valve Replacement -on Coumadin therapy.  Is managed by cardiology  Hyperlipidemia - Takes Zetia  Lab Results  Component Value Date   CHOL 133 02/28/2018   HDL 70 02/28/2018   LDLCALC 51 02/28/2018   TRIG 60 02/28/2018   CHOLHDL 1.9 02/28/2018    Essential Hypertension -prescribed metoprolol 25mg  twice daily.  Days chest pain, headaches, blurred vision, lightheadedness, or syncopal episodes BP Readings from Last 3 Encounters:  02/06/19 134/70  08/08/18 140/80  08/05/18 (!) 146/80     Insomnia - takes Restoril 15 mg PRN   Chronic Back Pain/bilateral knee pain - is well  controlled on Tramadol.   All immunizations and health maintenance protocols were reviewed with the patient and needed orders were placed.  Appropriate screening laboratory values were ordered for the patient including screening of hyperlipidemia, renal function and hepatic function. If indicated by BPH, a PSA was ordered.  Medication reconciliation,  past medical history, social history, problem list and allergies were reviewed in detail with the patient  Goals were established with regard to weight loss, exercise, and  diet in compliance with medications  End of life planning was discussed.  He does not participate in routine dental or vision screens. He refuses to have another colonoscopy done.    Review of Systems  Constitutional: Negative.   HENT: Negative.   Eyes: Negative.   Respiratory: Positive for shortness of breath (chronic) and wheezing (chronic).   Cardiovascular: Negative.   Gastrointestinal: Negative.   Endocrine: Negative.   Genitourinary: Negative.   Musculoskeletal: Positive for arthralgias, back pain and gait problem.  Skin: Negative.   Allergic/Immunologic: Negative.   Hematological: Negative.   Psychiatric/Behavioral: Negative.   All other systems reviewed and are negative.  Past Medical History:  Diagnosis Date  . Acute and subacute bacterial endocarditis 1999   group G Streptococcus  . Atrial fibrillation ([place]) 11/11/2008   Qualifier: Diagnosis of  By: Lia Foyer, MD, Jaquelyn Bitter   . CARDIOMYOPATHY 10/20/2008   Qualifier: Diagnosis of  By: Owens Shark, RN, BSN, Lauren    . Cerebrovascular disease, unspecified   . Closed right acetabular fracture ([place])   . COPD (chronic obstructive pulmonary disease) ([place]) 1999  . Epidural hematoma ([place]) 03/01/2011   Recent fall 2012   . Esophageal reflux   . Generalized osteoarthrosis, unspecified site   . History of tobacco abuse   .  Hypertension   . HYPERTENSION, BENIGN 10/20/2008   Qualifier: Diagnosis of  By:  Owens Shark, RN, BSN, Lauren    . Hyponatremia   . Insomnia   . Iron deficiency anemia, unspecified   . ISCHEMIC COLITIS, HX OF 10/20/2008   Qualifier: Diagnosis of  By: Owens Shark, RN, BSN, Lauren    . Lung nodules 2008, 2014   LLL nodule removed 2008 with LLL lobectomy,  RLL nodule 41mm observation  . Prostate cancer ([place]) 2015  . Protruding sternal wires   . S/P mitral valve replacement 02/01/1998   Carpentier-Edwards porcine bioprosthetic tissue valve, size 58mm Placed for acute and subacute bacterial endocarditis (group G Streptococcus) with pre-existing mitral valve prolapse   . S/P mitral valve replacement with bioprosthetic valve 03/24/2013   Redo mitral valve replacement using 7mm [facility] mitral bovine bioprosthetic tissue valve performed via right mini thoracotomy  . Seizures ([place])    last seizure 1 year ago  . Severe mitral regurgitation 02/20/2013  . Unsteady gait     Social History   Socioeconomic History  . Marital status: Divorced    Spouse name: Not on file  . Number of children: 2  . Years of education: 9  . Highest education level: Not on file  Occupational History  . Occupation: disabled  Social Needs  . Financial resource strain: Not on file  . Food insecurity    Worry: Not on file    Inability: Not on file  . Transportation needs    Medical: Not on file    Non-medical: Not on file  Tobacco Use  . Smoking status: Former Smoker    Packs/day: 2.00    Years: 45.00    Pack years: 90.00    Types: Cigarettes    Quit date: 07/30/2006    Years since quitting: 12.5  . Smokeless tobacco: Never Used  Substance and Sexual Activity  . Alcohol use: No    Alcohol/week: 0.0 standard drinks    Comment: previous alcohol use & home-made alcohol consumption  . Drug use: No  . Sexual activity: Not Currently  Lifestyle  . Physical activity    Days per week: Not on file    Minutes per session: Not on file  . Stress: Not on file  Relationships  . Social Product manager on phone: Not on file    Gets together: Not on file    Attends religious service: Not on file    Active member of club or organization: Not on file    Attends meetings of clubs or organizations: Not on file    Relationship status: Not on file  . Intimate partner violence    Fear of current or ex partner: Not on file    Emotionally abused: Not on file    Physically abused: Not on file    Forced sexual activity: Not on file  Other Topics Concern  . Not on file  Social History Narrative   Patient is divorced and has 2 children.   Patient is right handed.   Patient has 9 th grade education.    Patient drinks diet sodas.      [facility]:   From [place] originally. Always lived in [place]. 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 Previously traveled to [place], [place], & [place]. No pets currently. Remote pet owl and parakeet exposure. No mold exposure. Enjoys fishing.     Past Surgical History:  Procedure Laterality Date  . CARDIAC VALVE REPLACEMENT    .  INTRAOPERATIVE TRANSESOPHAGEAL ECHOCARDIOGRAM N/A 03/24/2013   Procedure: INTRAOPERATIVE TRANSESOPHAGEAL ECHOCARDIOGRAM;  Surgeon: Rexene Alberts, MD;  Location: [facility];  Service: Open Heart Surgery;  Laterality: N/A;  . MINIMALLY INVASIVE MAZE PROCEDURE N/A 03/24/2013   Procedure: MINIMALLY INVASIVE MAZE PROCEDURE;  Surgeon: Rexene Alberts, MD;  Location: [facility];  Service: Open Heart Surgery;  Laterality: N/A;  . MITRAL VALVE REPLACEMENT  02/01/1998   Carpentier-Edwards porcine bioprosthetic tissue valve, size 23mm, placed for complicated bacterial endocarditis  . MITRAL VALVE REPLACEMENT Right 03/24/2013   Procedure: MINIMALLY INVASIVE REDO MITRAL VALVE (MV) REPLACEMENT;  Surgeon: Rexene Alberts, MD;  Location: [facility];  Service: Open Heart Surgery;  Laterality: Right;  . PROSTATE BIOPSY    . radiation treatment tx for prostate cancer  2015  . STERNAL WIRES REMOVAL N/A 07/10/2018   Procedure: STERNAL WIRES REMOVAL;   Surgeon: Rexene Alberts, MD;  Location: [facility];  Service: Thoracic;  Laterality: N/A;  . TEE WITHOUT CARDIOVERSION N/A 02/12/2013   Procedure: TRANSESOPHAGEAL ECHOCARDIOGRAM (TEE);  Surgeon: Fay Records, MD;  Location[facility] ENDOSCOPY;  Service: Cardiovascular;  Laterality: N/A;  . VIDEO ASSISTED THORACOSCOPY  04/29/2007   Left VATS w/ mini thoracotomy for Left Lower Lobectomy for benign lung nodules    Family History  Problem Relation Age of Onset  . Cancer Father        prostate cancer  . Tuberculosis Father   . Heart attack Father   . Cancer Brother        prostate cancer  . Arthritis Mother   . Lung disease Neg Hx     Allergies  Allergen Reactions  . Ace Inhibitors Swelling  . Lisinopril     Seizures   . Rosuvastatin Other (See Comments)    Muscle aches    Current Outpatient Medications on File Prior to Visit  Medication Sig Dispense Refill  . Cholecalciferol (VITAMIN D3) 1000 UNITS CAPS Take 1,000 Units by mouth daily.     Marland Kitchen ezetimibe (ZETIA) 10 MG tablet TAKE 1 TABLET EVERY DAY 90 tablet 0  . levETIRAcetam (KEPPRA) 500 MG tablet Take 1 tablet (500 mg total) by mouth 2 (two) times daily. 180 tablet 3  . metoprolol tartrate (LOPRESSOR) 25 MG tablet TAKE 1/2 TABLET TWICE DAILY 90 tablet 0  . phenytoin (DILANTIN) 100 MG ER capsule TAKE 3 CAPSULES (300MG ) AT BEDTIME 270 capsule 3  . temazepam (RESTORIL) 15 MG capsule Take 15 mg by mouth at bedtime as needed for sleep.   3  . tiotropium (SPIRIVA HANDIHALER) 18 MCG inhalation capsule INHALE THE CONTENTS OF 1 CAPSULE EVERY DAY VIA HANDIHALER 90 capsule 3  . tiZANidine (ZANAFLEX) 4 MG tablet TAKE 1 TABLET EVERY 6 HOURS AS NEEDED FOR MUSCLE SPASMS 360 tablet 0  . traMADol (ULTRAM) 50 MG tablet TAKE 1 TABLET BY MOUTH EVERY 8 HOURS AS NEEDED 90 tablet 1  . warfarin (COUMADIN) 6 MG tablet Take As Directed by Coumadin Clinic 90 tablet 0   No current facility-administered medications on file prior to visit.     BP 134/70 (BP  Location: Left Arm, Patient Position: Sitting, Cuff Size: Normal)   Pulse (!) 106   Temp (!) 97.4 F (36.3 C) (Oral)   Wt 136 lb 3.2 oz (61.8 kg)   SpO2 (!) 88%   BMI 23.38 kg/m       Objective:   Physical Exam Vitals signs and nursing note reviewed.  Constitutional:      General: He is not in  acute distress.    Appearance: Normal appearance. He is ill-appearing (chronically).  HENT:     Head: Normocephalic and atraumatic.     Right Ear: Tympanic membrane, ear canal and external ear normal. There is no impacted cerumen.     Left Ear: Tympanic membrane, ear canal and external ear normal. There is no impacted cerumen.     Nose: Nose normal. No congestion or rhinorrhea.     Mouth/Throat:     Mouth: Mucous membranes are moist.     Pharynx: Oropharynx is clear. No oropharyngeal exudate or posterior oropharyngeal erythema.  Eyes:     General: No scleral icterus.       Right eye: No discharge.        Left eye: No discharge.     Extraocular Movements: Extraocular movements intact.     Conjunctiva/sclera: Conjunctivae normal.     Pupils: Pupils are equal, round, and reactive to light.  Neck:     Vascular: No carotid bruit.  Cardiovascular:     Rate and Rhythm: Normal rate. Rhythm irregularly irregular.     Pulses: Normal pulses.     Heart sounds: Normal heart sounds. No murmur. No friction rub. No gallop.   Pulmonary:     Effort: Pulmonary effort is normal. No respiratory distress.     Breath sounds: Normal breath sounds. No stridor. No wheezing, rhonchi or rales.  Chest:     Chest wall: No tenderness.  Abdominal:     General: Abdomen is flat. Bowel sounds are normal. There is no distension.     Palpations: Abdomen is soft. There is no mass.     Tenderness: There is no abdominal tenderness. There is no right CVA tenderness, left CVA tenderness, guarding or rebound.     Hernia: No hernia is present.  Musculoskeletal: Normal range of motion.        General: No swelling,  tenderness, deformity or signs of injury.     Right lower leg: No edema.     Left lower leg: No edema.  Skin:    General: Skin is warm and dry.     Capillary Refill: Capillary refill takes less than 2 seconds.     Coloration: Skin is not jaundiced or pale.     Findings: No bruising, erythema, lesion or rash.  Neurological:     General: No focal deficit present.     Mental Status: He is alert and oriented to person, place, and time.     Gait: Gait abnormal.     Comments: Walks with single prong cane   Psychiatric:        Mood and Affect: Mood normal.        Behavior: Behavior normal.        Thought Content: Thought content normal.        Judgment: Judgment normal.        Assessment & Plan:  1. Routine general medical examination at a health care facility  - CBC with Differential/Platelet - Comprehensive metabolic panel - Lipid panel - PSA - TSH  2. Atrial fibrillation, unspecified type ([place]) - Follow up with Cardiology as directed - CBC with Differential/Platelet - Comprehensive metabolic panel - Lipid panel - TSH - POC INR  3. Other emphysema ([place]) - Follow Pulmonary plan of care  4. HYPERTENSION, BENIGN - No change in medication  - CBC with Differential/Platelet - Comprehensive metabolic panel - Lipid panel - PSA - TSH  5. Long term (current) use of anticoagulants  -  POC INR  6. Seizures ([place]) - Follow up with neurology as directed - Phenytoin Level, Total  7. Mixed hyperlipidemia - continue with Zetia  - CBC with Differential/Platelet - Comprehensive metabolic panel - Lipid panel - PSA - TSH   Dorothyann Peng, NP

## 2019-02-06 NOTE — Patient Instructions (Signed)
Description   Spoke with pt and instructed to hold today's dose then continue taking 1 tablet daily except 1/2 tablet on Mondays, Wednesdays and Fridays.  Recheck INR in 4 weeks.

## 2019-02-06 NOTE — Patient Instructions (Signed)
It was great seeing you today   We will follow up with you regarding your blood work   I will forward your INR results to the nurse  Please follow up if you would like to try knee injections to help with the pain

## 2019-02-07 LAB — PHENYTOIN LEVEL, TOTAL: Phenytoin, Total: 12 mg/L (ref 10.0–20.0)

## 2019-02-16 ENCOUNTER — Other Ambulatory Visit: Payer: Self-pay | Admitting: Adult Health

## 2019-02-17 DIAGNOSIS — Z8546 Personal history of malignant neoplasm of prostate: Secondary | ICD-10-CM | POA: Diagnosis not present

## 2019-02-18 NOTE — Telephone Encounter (Signed)
SENT TO THE PHARMACY BY E-SCRIBE. 

## 2019-02-19 ENCOUNTER — Encounter: Payer: Self-pay | Admitting: Family Medicine

## 2019-02-19 ENCOUNTER — Telehealth: Payer: Self-pay | Admitting: Adult Health

## 2019-02-19 NOTE — Telephone Encounter (Signed)
Several attempts to speak with Mr Jeffery Copeland. Mr Jeffery Copeland was unable to hear me.Mr Jeffery Copeland gave me permission to speak to his sister Jeffery Copeland. But I was unable to reach her. Will route note to office to have them call Mr Jeffery Copeland

## 2019-02-20 NOTE — Telephone Encounter (Signed)
Letter mailed to the pt due to hearing. See telephone encounter from 02/19/2019.

## 2019-02-20 NOTE — Telephone Encounter (Signed)
Patient returned Jeffery Copeland's call but she was busy in clinic. Please try and call patient with the labs results.

## 2019-02-26 ENCOUNTER — Other Ambulatory Visit: Payer: Self-pay | Admitting: Adult Health

## 2019-02-26 DIAGNOSIS — Z760 Encounter for issue of repeat prescription: Secondary | ICD-10-CM

## 2019-02-27 NOTE — Telephone Encounter (Signed)
Sent to the pharmacy by e-scribe for 6 months. 

## 2019-03-06 ENCOUNTER — Ambulatory Visit (INDEPENDENT_AMBULATORY_CARE_PROVIDER_SITE_OTHER): Payer: Medicare HMO | Admitting: *Deleted

## 2019-03-06 ENCOUNTER — Other Ambulatory Visit: Payer: Self-pay

## 2019-03-06 DIAGNOSIS — Z5181 Encounter for therapeutic drug level monitoring: Secondary | ICD-10-CM | POA: Diagnosis not present

## 2019-03-06 DIAGNOSIS — I4891 Unspecified atrial fibrillation: Secondary | ICD-10-CM | POA: Diagnosis not present

## 2019-03-06 LAB — POCT INR: INR: 3.8 — AB (ref 2.0–3.0)

## 2019-03-06 NOTE — Patient Instructions (Addendum)
Description   Do not take any Coumadin today then start taking 1/2 tablet daily except 1 tablet on Tuesdays, Thursdays, Saturdays.  Recheck INR in 3 weeks. Coumadin Clinic [phone]

## 2019-03-09 ENCOUNTER — Other Ambulatory Visit: Payer: Self-pay

## 2019-03-09 ENCOUNTER — Ambulatory Visit (HOSPITAL_COMMUNITY)
Admission: RE | Admit: 2019-03-09 | Discharge: 2019-03-09 | Disposition: A | Discharge status: Home / Self Care | Payer: Medicare HMO | Source: Ambulatory Visit | Attending: Cardiology | Admitting: Cardiology

## 2019-03-09 ENCOUNTER — Other Ambulatory Visit (HOSPITAL_COMMUNITY): Payer: Self-pay | Admitting: Cardiology

## 2019-03-09 DIAGNOSIS — I6523 Occlusion and stenosis of bilateral carotid arteries: Secondary | ICD-10-CM

## 2019-03-09 DIAGNOSIS — I6521 Occlusion and stenosis of right carotid artery: Secondary | ICD-10-CM | POA: Insufficient documentation

## 2019-03-27 ENCOUNTER — Other Ambulatory Visit: Payer: Self-pay

## 2019-03-27 ENCOUNTER — Ambulatory Visit (INDEPENDENT_AMBULATORY_CARE_PROVIDER_SITE_OTHER): Payer: Medicare HMO | Admitting: *Deleted

## 2019-03-27 DIAGNOSIS — Z5181 Encounter for therapeutic drug level monitoring: Secondary | ICD-10-CM | POA: Diagnosis not present

## 2019-03-27 DIAGNOSIS — I4891 Unspecified atrial fibrillation: Secondary | ICD-10-CM | POA: Diagnosis not present

## 2019-03-27 LAB — POCT INR: INR: 2.1 (ref 2.0–3.0)

## 2019-03-27 NOTE — Patient Instructions (Signed)
Description   Today take 1 tablet then continue taking 1/2 tablet daily except 1 tablet on Tuesdays, Thursdays, Saturdays.  Recheck INR in 3 weeks. Coumadin Clinic [phone]

## 2019-03-30 ENCOUNTER — Other Ambulatory Visit: Payer: Self-pay | Admitting: Adult Health

## 2019-03-30 DIAGNOSIS — Z760 Encounter for issue of repeat prescription: Secondary | ICD-10-CM

## 2019-04-16 ENCOUNTER — Other Ambulatory Visit: Payer: Self-pay | Admitting: Adult Health

## 2019-04-23 ENCOUNTER — Other Ambulatory Visit (HOSPITAL_COMMUNITY): Payer: Self-pay

## 2019-04-29 ENCOUNTER — Other Ambulatory Visit (HOSPITAL_COMMUNITY): Payer: Self-pay

## 2019-04-29 MED ORDER — WARFARIN SODIUM 6 MG PO TABS: ORAL_TABLET | ORAL | 0 refills | Status: DC

## 2019-04-29 NOTE — Addendum Note (Signed)
Addended by: Brynda Peon on: 04/29/2019 03:53 PM   Modules accepted: Orders

## 2019-05-01 ENCOUNTER — Other Ambulatory Visit: Payer: Self-pay

## 2019-05-01 ENCOUNTER — Ambulatory Visit (INDEPENDENT_AMBULATORY_CARE_PROVIDER_SITE_OTHER): Payer: Medicare HMO | Admitting: *Deleted

## 2019-05-01 DIAGNOSIS — I4891 Unspecified atrial fibrillation: Secondary | ICD-10-CM

## 2019-05-01 DIAGNOSIS — Z5181 Encounter for therapeutic drug level monitoring: Secondary | ICD-10-CM

## 2019-05-01 LAB — POCT INR: INR: 2.6 (ref 2.0–3.0)

## 2019-05-01 NOTE — Patient Instructions (Signed)
Description   Continue taking 1/2 tablet daily except 1 tablet on Tuesdays, Thursdays, Saturdays.  Recheck INR in 4 weeks. Coumadin Clinic [phone]

## 2019-05-06 ENCOUNTER — Other Ambulatory Visit: Payer: Self-pay | Admitting: Adult Health

## 2019-05-06 MED ORDER — TRAMADOL HCL 50 MG PO TABS: ORAL_TABLET | ORAL | 1 refills | Status: DC

## 2019-05-06 NOTE — Telephone Encounter (Signed)
Medication refill: traMADol (ULTRAM) 50 MG tablet [id number]   Pharmacy:  [facility] - [place], [place] [phone] (Phone) [phone] (Fax)   Pt is completely out.

## 2019-05-06 NOTE — Telephone Encounter (Signed)
Requested medication (s) are due for refill today: yes  Requested medication (s) are on the active medication list: yes  Last refill: 02/05/2019  Future visit scheduled: no  Notes to clinic:  Refill cannot be delegated  Patient is out of medication    Requested Prescriptions  Pending Prescriptions Disp Refills   traMADol (ULTRAM) 50 MG tablet 90 tablet 1    Sig: TAKE 1 TABLET BY MOUTH EVERY 8 HOURS AS NEEDED     Not Delegated - Analgesics:  Opioid Agonists Failed - 05/06/2019  9:38 AM      Failed - This refill cannot be delegated      Failed - Urine Drug Screen completed in last 360 days.      Passed - Valid encounter within last 6 months    Recent Outpatient Visits          2 months ago Routine general medical examination at a health care facility   [facility], [place], NP   9 months ago Encounter for removal of sutures   Therapist, music at [facility], [place], NP   10 months ago Infection of sternotomy closure wire, subsequent Education administrator at [facility], [place], NP   10 months ago Skin lesion   [facility] -[place] [place], NP   1 year ago Routine general medical examination at a health care facility   [facility], [place], NP      Future Appointments            In 5 days Marlou Porch, Thana Farr, MD [place], [facility]

## 2019-05-11 ENCOUNTER — Ambulatory Visit (INDEPENDENT_AMBULATORY_CARE_PROVIDER_SITE_OTHER): Payer: Medicare HMO | Admitting: Cardiology

## 2019-05-11 ENCOUNTER — Encounter: Payer: Self-pay | Admitting: Cardiology

## 2019-05-11 ENCOUNTER — Other Ambulatory Visit: Payer: Self-pay

## 2019-05-11 VITALS — BP 138/70 | HR 59 | Ht 64.0 in | Wt 141.0 lb

## 2019-05-11 DIAGNOSIS — Z9889 Other specified postprocedural states: Secondary | ICD-10-CM

## 2019-05-11 DIAGNOSIS — E782 Mixed hyperlipidemia: Secondary | ICD-10-CM | POA: Diagnosis not present

## 2019-05-11 DIAGNOSIS — J449 Chronic obstructive pulmonary disease, unspecified: Secondary | ICD-10-CM | POA: Diagnosis not present

## 2019-05-11 DIAGNOSIS — I5032 Chronic diastolic (congestive) heart failure: Secondary | ICD-10-CM | POA: Diagnosis not present

## 2019-05-11 DIAGNOSIS — R569 Unspecified convulsions: Secondary | ICD-10-CM | POA: Diagnosis not present

## 2019-05-11 DIAGNOSIS — I4821 Permanent atrial fibrillation: Secondary | ICD-10-CM | POA: Diagnosis not present

## 2019-05-11 DIAGNOSIS — I6521 Occlusion and stenosis of right carotid artery: Secondary | ICD-10-CM | POA: Diagnosis not present

## 2019-05-11 NOTE — Patient Instructions (Signed)
Medication Instructions:  The current medical regimen is effective;  continue present plan and medications.  If you need a refill on your cardiac medications before your next appointment, please call your pharmacy.   Follow-Up: At [facility], you and your health needs are our priority.  As part of our continuing mission to provide you with exceptional heart care, we have created designated Provider Care Teams.  These Care Teams include your primary Cardiologist (physician) and Advanced Practice Providers (APPs -  Physician Assistants and Nurse Practitioners) who all work together to provide you with the care you need, when you need it. You will need a follow up appointment in 12 months with Cecilie Kicks, NP and 2 years with Dr Marlou Porch.  Please call our office 2 months in advance to schedule this appointment.  You may see No primary care provider on file. or one of the following Advanced Practice Providers on your designated Care Team:   Truitt Merle, NP Cecilie Kicks, NP . Kathyrn Drown, NP  Thank you for choosing [facility]!!

## 2019-05-11 NOTE — Progress Notes (Signed)
Cardiology Office Note:    Date:  05/11/2019   ID:  Jeffery Copeland, DOB 1946-02-02, MRN [id number]  PCP:  Dorothyann Peng, NP  Cardiologist:  No primary care provider on file.  Electrophysiologist:  None   Referring MD: Dorothyann Peng, NP     History of Present Illness:    Jeffery Copeland is a 73 y.o. male with permanent atrial fibrillation followed by Coumadin clinic here in our office with prior history of endocarditis with bioprosthetic mitral valve replacement in 1999, redo valve in 2014 here for follow-up.  Last seen by Cecilie Kicks on 02/28/2018.  Overall doing quite well.  COPD is stable.  Carotid stenosis moderate 59%.  Ambulates with a cane.  Denies any changes in his symptoms.  No chest pain fevers chills nausea vomiting syncope bleeding.  Has baseline shortness of breath with COPD.  Pleasant.  Back in December 2019, he did have a protruding sternal wire.  This was taken care of by Dr. Roxy Manns.  Past Medical History:  Diagnosis Date  . Acute and subacute bacterial endocarditis 1999   group G Streptococcus  . Atrial fibrillation ([place]) 11/11/2008   Qualifier: Diagnosis of  By: Lia Foyer, MD, Jaquelyn Bitter   . CARDIOMYOPATHY 10/20/2008   Qualifier: Diagnosis of  By: Owens Shark, RN, BSN, Lauren    . Cerebrovascular disease, unspecified   . Closed right acetabular fracture ([place])   . COPD (chronic obstructive pulmonary disease) ([place]) 1999  . Epidural hematoma ([place]) 03/01/2011   Recent fall 2012   . Esophageal reflux   . Generalized osteoarthrosis, unspecified site   . History of tobacco abuse   . Hypertension   . HYPERTENSION, BENIGN 10/20/2008   Qualifier: Diagnosis of  By: Owens Shark, RN, BSN, Lauren    . Hyponatremia   . Insomnia   . Iron deficiency anemia, unspecified   . ISCHEMIC COLITIS, HX OF 10/20/2008   Qualifier: Diagnosis of  By: Owens Shark, RN, BSN, Lauren    . Lung nodules 2008, 2014   LLL nodule removed 2008 with LLL lobectomy,  RLL nodule 68mm observation  . Prostate cancer  ([place]) 2015  . Protruding sternal wires   . S/P mitral valve replacement 02/01/1998   Carpentier-Edwards porcine bioprosthetic tissue valve, size 51mm Placed for acute and subacute bacterial endocarditis (group G Streptococcus) with pre-existing mitral valve prolapse   . S/P mitral valve replacement with bioprosthetic valve 03/24/2013   Redo mitral valve replacement using 51mm Edwards [facility] mitral bovine bioprosthetic tissue valve performed via right mini thoracotomy  . Seizures ([place])    last seizure 1 year ago  . Severe mitral regurgitation 02/20/2013  . Unsteady gait     Past Surgical History:  Procedure Laterality Date  . CARDIAC VALVE REPLACEMENT    . INTRAOPERATIVE TRANSESOPHAGEAL ECHOCARDIOGRAM N/A 03/24/2013   Procedure: INTRAOPERATIVE TRANSESOPHAGEAL ECHOCARDIOGRAM;  Surgeon: Rexene Alberts, MD;  Location: [place];  Service: Open Heart Surgery;  Laterality: N/A;  . MINIMALLY INVASIVE MAZE PROCEDURE N/A 03/24/2013   Procedure: MINIMALLY INVASIVE MAZE PROCEDURE;  Surgeon: Rexene Alberts, MD;  Location: [place];  Service: Open Heart Surgery;  Laterality: N/A;  . MITRAL VALVE REPLACEMENT  02/01/1998   Carpentier-Edwards porcine bioprosthetic tissue valve, size 57mm, placed for complicated bacterial endocarditis  . MITRAL VALVE REPLACEMENT Right 03/24/2013   Procedure: MINIMALLY INVASIVE REDO MITRAL VALVE (MV) REPLACEMENT;  Surgeon: Rexene Alberts, MD;  Location: [place];  Service: Open Heart Surgery;  Laterality: Right;  . PROSTATE BIOPSY    .  radiation treatment tx for prostate cancer  2015  . STERNAL WIRES REMOVAL N/A 07/10/2018   Procedure: STERNAL WIRES REMOVAL;  Surgeon: Rexene Alberts, MD;  Location: [place];  Service: Thoracic;  Laterality: N/A;  . TEE WITHOUT CARDIOVERSION N/A 02/12/2013   Procedure: TRANSESOPHAGEAL ECHOCARDIOGRAM (TEE);  Surgeon: Fay Records, MD;  Location: [place];  Service: Cardiovascular;  Laterality: N/A;  . VIDEO ASSISTED THORACOSCOPY  04/29/2007   Left  VATS w/ mini thoracotomy for Left Lower Lobectomy for benign lung nodules    Current Medications: Current Meds  Medication Sig  . Cholecalciferol (VITAMIN D3) 1000 UNITS CAPS Take 1,000 Units by mouth daily.   Marland Kitchen ezetimibe (ZETIA) 10 MG tablet TAKE 1 TABLET EVERY DAY  . levETIRAcetam (KEPPRA) 500 MG tablet TAKE 1 TABLET (500 MG TOTAL) BY MOUTH 2 (TWO) TIMES DAILY.  . metoprolol tartrate (LOPRESSOR) 25 MG tablet TAKE 1/2 TABLET TWICE DAILY  . phenytoin (DILANTIN) 100 MG ER capsule TAKE 3 CAPSULES (300MG ) AT BEDTIME  . temazepam (RESTORIL) 15 MG capsule Take 15 mg by mouth at bedtime as needed for sleep.   Marland Kitchen tiotropium (SPIRIVA HANDIHALER) 18 MCG inhalation capsule INHALE THE CONTENTS OF 1 CAPSULE EVERY DAY VIA HANDIHALER  . tiZANidine (ZANAFLEX) 4 MG tablet TAKE 1 TABLET EVERY 6 HOURS AS NEEDED FOR MUSCLE SPASMS  . traMADol (ULTRAM) 50 MG tablet TAKE 1 TABLET BY MOUTH EVERY 8 HOURS AS NEEDED  . warfarin (COUMADIN) 6 MG tablet Take As Directed by Coumadin Clinic     Allergies:   Ace inhibitors, Lisinopril, and Rosuvastatin   Social History   Socioeconomic History  . Marital status: Divorced    Spouse name: Not on file  . Number of children: 2  . Years of education: 9  . Highest education level: Not on file  Occupational History  . Occupation: disabled  Social Needs  . Financial resource strain: Not on file  . Food insecurity    Worry: Not on file    Inability: Not on file  . Transportation needs    Medical: Not on file    Non-medical: Not on file  Tobacco Use  . Smoking status: Former Smoker    Packs/day: 2.00    Years: 45.00    Pack years: 90.00    Types: Cigarettes    Quit date: 07/30/2006    Years since quitting: 12.7  . Smokeless tobacco: Never Used  Substance and Sexual Activity  . Alcohol use: No    Alcohol/week: 0.0 standard drinks    Comment: previous alcohol use & home-made alcohol consumption  . Drug use: No  . Sexual activity: Not Currently  Lifestyle  .  Physical activity    Days per week: Not on file    Minutes per session: Not on file  . Stress: Not on file  Relationships  . Social Herbalist on phone: Not on file    Gets together: Not on file    Attends religious service: Not on file    Active member of club or organization: Not on file    Attends meetings of clubs or organizations: Not on file    Relationship status: Not on file  Other Topics Concern  . Not on file  Social History Narrative   Patient is divorced and has 2 children.   Patient is right handed.   Patient has 9 th grade education.    Patient drinks diet sodas.      [facility]:  From [place] originally. Always lived in [place]. 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 Previously traveled to [place], [place], & [place]. No pets currently. Remote pet owl and parakeet exposure. No mold exposure. Enjoys fishing.      Family History: The patient's family history includes Arthritis in his mother; Cancer in his brother and father; Heart attack in his father; Tuberculosis in his father. There is no history of Lung disease.  ROS:   Please see the history of present illness.    Denies any fevers chills nausea vomiting syncope bleeding all other systems reviewed and are negative.  EKGs/Labs/Other Studies Reviewed:    The following studies were reviewed today:  ECHO 03/07/18: - Left ventricle: The cavity size was normal. Wall thickness was   normal. Systolic function was normal. The estimated ejection   fraction was in the range of 55% to 60%. Wall motion was normal;   there were no regional wall motion abnormalities. - Aortic valve: There was trivial regurgitation. - Aortic root: The aortic root was mildly dilated. - Mitral valve: A bioprosthesis was present. - Left atrium: The atrium was severely dilated. - Pulmonary arteries: Systolic pressure was moderately increased.   PA peak pressure: 48 mm Hg (S).  Impressions:  - Normal LV  systolic function; trace AI; mildly dilated aortic   root; s/p MVR with normal mean gradient of 3 mmHg; severe LAE;   mild TR with moderate pulmonary hypertension.  Carotid Doppler 03/09/19: Right Carotid: Velocities in the right ICA are consistent with a 40-59%                stenosis. Non-hemodynamically significant plaque <50% noted in                the CCA.  Left Carotid: Velocities in the left ICA are consistent with a 1-39% stenosis.               Non-hemodynamically significant plaque noted in the CCA.  Vertebrals:  Bilateral vertebral arteries demonstrate antegrade flow. Subclavians: Normal flow hemodynamics were seen in bilateral subclavian              arteries.  *See table(s) above for measurements and observations. Suggest follow up study in 12 months.  EKG: 02/28/2018-nonspecific ST-T wave changes with A. fib normal rate  Recent Labs: 02/06/2019: ALT 22; BUN 6; Creatinine, Ser 1.06; Hemoglobin 12.7; Platelets 178.0; Potassium 4.5; Sodium 138; TSH 1.36  Recent Lipid Panel    Component Value Date/Time   CHOL 137 02/06/2019 1031   CHOL 133 02/28/2018 0948   TRIG 63.0 02/06/2019 1031   HDL 61.90 02/06/2019 1031   HDL 70 02/28/2018 0948   CHOLHDL 2 02/06/2019 1031   VLDL 12.6 02/06/2019 1031   LDLCALC 62 02/06/2019 1031   LDLCALC 51 02/28/2018 0948    Physical Exam:    VS:  BP 138/70   Pulse (!) 59   Ht 5\' 4"  (1.626 m)   Wt 141 lb (64 kg)   SpO2 95%   BMI 24.20 kg/m     Wt Readings from Last 3 Encounters:  05/11/19 141 lb (64 kg)  02/06/19 136 lb 3.2 oz (61.8 kg)  08/08/18 149 lb (67.6 kg)     GEN:  Well nourished, well developed in no acute distress uses a walker HEENT: Normal, hard of hearing NECK: No JVD; No carotid bruits LYMPHATICS: No lymphadenopathy CARDIAC: RRR, no murmurs, rubs, gallops, chest wall scar RESPIRATORY:  Clear to auscultation without rales,  wheezing or rhonchi  ABDOMEN: Soft, non-tender, non-distended MUSCULOSKELETAL:  No  edema; No deformity  SKIN: Warm and dry NEUROLOGIC:  Alert and oriented x 3 PSYCHIATRIC:  Normal affect   ASSESSMENT:    1. Permanent atrial fibrillation ([place])   2. MITRAL VALVE REPLACEMENT, HX OF   3. Stenosis of right carotid artery   4. Mixed hyperlipidemia    PLAN:    In order of problems listed above:  Mitral valve replacement -Valve in 1999 as well as in 2014.  Dental prophylaxis.  Echocardiogram stable as above.  Permanent atrial fibrillation -Coumadin anticoagulation, rate control.  Doing well.  Chronic diastolic heart failure -Euvolemic.  Doing well.  COPD -Pulmonary has been following in the past.  Carotid artery disease -Left-39%, right-59% repeating in 1 year.  Secondary factor prevention.  No strokelike symptoms.  Seizure disorder -No recent seizures.  Medications reviewed as above.  Neurology.  Hyperlipidemia  - Zetia 10. LDL 51.  Previously may have had some muscle aches with Crestor.  No changes made.  Mickel Baas in one year. Me in 2  Medication Adjustments/Labs and Tests Ordered: Current medicines are reviewed at length with the patient today.  Concerns regarding medicines are outlined above.  No orders of the defined types were placed in this encounter.  No orders of the defined types were placed in this encounter.   Patient Instructions  Medication Instructions:  The current medical regimen is effective;  continue present plan and medications.  If you need a refill on your cardiac medications before your next appointment, please call your pharmacy.   Follow-Up: At [facility], you and your health needs are our priority.  As part of our continuing mission to provide you with exceptional heart care, we have created designated Provider Care Teams.  These Care Teams include your primary Cardiologist (physician) and Advanced Practice Providers (APPs -  Physician Assistants and Nurse Practitioners) who all work together to provide you with the care you  need, when you need it. You will need a follow up appointment in 12 months with Cecilie Kicks, NP and 2 years with Dr Marlou Porch.  Please call our office 2 months in advance to schedule this appointment.  You may see No primary care provider on file. or one of the following Advanced Practice Providers on your designated Care Team:   Truitt Merle, NP Cecilie Kicks, NP . Kathyrn Drown, NP  Thank you for choosing [facility]!!          Signed, Candee Furbish, MD  05/11/2019 11:17 AM    [place]

## 2019-05-29 ENCOUNTER — Ambulatory Visit (INDEPENDENT_AMBULATORY_CARE_PROVIDER_SITE_OTHER): Payer: Medicare HMO | Admitting: *Deleted

## 2019-05-29 ENCOUNTER — Other Ambulatory Visit: Payer: Self-pay

## 2019-05-29 DIAGNOSIS — Z5181 Encounter for therapeutic drug level monitoring: Secondary | ICD-10-CM

## 2019-05-29 DIAGNOSIS — I4891 Unspecified atrial fibrillation: Secondary | ICD-10-CM | POA: Diagnosis not present

## 2019-05-29 LAB — POCT INR: INR: 2.5 (ref 2.0–3.0)

## 2019-05-29 NOTE — Patient Instructions (Signed)
Description   Continue taking 1/2 tablet daily except 1 tablet on Tuesdays, Thursdays, Saturdays.  Recheck INR in 6 weeks. Coumadin Clinic [phone]

## 2019-06-04 ENCOUNTER — Other Ambulatory Visit: Payer: Self-pay | Admitting: Primary Care

## 2019-06-26 ENCOUNTER — Other Ambulatory Visit: Payer: Self-pay | Admitting: Cardiology

## 2019-07-07 ENCOUNTER — Telehealth: Payer: Self-pay | Admitting: *Deleted

## 2019-07-07 NOTE — Telephone Encounter (Signed)
Copied from [place] [id number]. Topic: General - Other >> Jul 07, 2019  1:36 PM Jeffery Copeland A wrote: Reason for CRM: Patient called to say that he would like to have a coumadin test at his appointment on 07/09/2019

## 2019-07-07 NOTE — Telephone Encounter (Signed)
That is fine 

## 2019-07-08 ENCOUNTER — Other Ambulatory Visit: Payer: Self-pay

## 2019-07-08 NOTE — Telephone Encounter (Signed)
Noted  

## 2019-07-09 ENCOUNTER — Ambulatory Visit (INDEPENDENT_AMBULATORY_CARE_PROVIDER_SITE_OTHER): Payer: Medicare HMO | Admitting: Adult Health

## 2019-07-09 ENCOUNTER — Encounter: Payer: Self-pay | Admitting: Adult Health

## 2019-07-09 VITALS — BP 148/70 | Temp 97.8°F | Wt 146.0 lb

## 2019-07-09 DIAGNOSIS — Z23 Encounter for immunization: Secondary | ICD-10-CM

## 2019-07-09 DIAGNOSIS — Z7901 Long term (current) use of anticoagulants: Secondary | ICD-10-CM

## 2019-07-09 NOTE — Addendum Note (Signed)
Addended by: Suzette Battiest on: 07/09/2019 03:22 PM   Modules accepted: Orders

## 2019-07-09 NOTE — Addendum Note (Signed)
Addended by: Miles Costain T on: 07/09/2019 03:20 PM   Modules accepted: Orders

## 2019-07-09 NOTE — Progress Notes (Signed)
Subjective:    Patient ID: Jeffery Copeland, male    DOB: 09-26-1945, 73 y.o.   MRN: [id number]  HPI 74 year old male who  has a past medical history of Acute and subacute bacterial endocarditis (1999), Atrial fibrillation ([place]) (11/11/2008), CARDIOMYOPATHY (10/20/2008), Cerebrovascular disease, unspecified, Closed right acetabular fracture ([place]), COPD (chronic obstructive pulmonary disease) ([place]) (1999), Epidural hematoma ([place]) (03/01/2011), Esophageal reflux, Generalized osteoarthrosis, unspecified site, History of tobacco abuse, Hypertension, HYPERTENSION, BENIGN (10/20/2008), Hyponatremia, Insomnia, Iron deficiency anemia, unspecified, ISCHEMIC COLITIS, HX OF (10/20/2008), Lung nodules (2008, 2014), Prostate cancer ([place]) (2015), Protruding sternal wires, S/P mitral valve replacement (02/01/1998), S/P mitral valve replacement with bioprosthetic valve (03/24/2013), Seizures ([place]), Severe mitral regurgitation (02/20/2013), and Unsteady gait.  He presented to the office today for his CPE. There was some confusion as he had a CPE in July and is not due.   Since he is here he would like to have him coumadin level checked since he cancelled his appointment at the [facility] clinic in order to come here.    Review of Systems See HPI   Past Medical History:  Diagnosis Date  . Acute and subacute bacterial endocarditis 1999   group G Streptococcus  . Atrial fibrillation ([place]) 11/11/2008   Qualifier: Diagnosis of  By: Lia Foyer, MD, Jaquelyn Bitter   . CARDIOMYOPATHY 10/20/2008   Qualifier: Diagnosis of  By: Owens Shark, RN, BSN, Lauren    . Cerebrovascular disease, unspecified   . Closed right acetabular fracture ([place])   . COPD (chronic obstructive pulmonary disease) ([place]) 1999  . Epidural hematoma ([place]) 03/01/2011   Recent fall 2012   . Esophageal reflux   . Generalized osteoarthrosis, unspecified site   . History of tobacco abuse   . Hypertension   . HYPERTENSION, BENIGN 10/20/2008   Qualifier: Diagnosis  of  By: Owens Shark, RN, BSN, Lauren    . Hyponatremia   . Insomnia   . Iron deficiency anemia, unspecified   . ISCHEMIC COLITIS, HX OF 10/20/2008   Qualifier: Diagnosis of  By: Owens Shark, RN, BSN, Lauren    . Lung nodules 2008, 2014   LLL nodule removed 2008 with LLL lobectomy,  RLL nodule 69mm observation  . Prostate cancer ([place]) 2015  . Protruding sternal wires   . S/P mitral valve replacement 02/01/1998   Carpentier-Edwards porcine bioprosthetic tissue valve, size 73mm Placed for acute and subacute bacterial endocarditis (group G Streptococcus) with pre-existing mitral valve prolapse   . S/P mitral valve replacement with bioprosthetic valve 03/24/2013   Redo mitral valve replacement using 4mm Edwards [facility] mitral bovine bioprosthetic tissue valve performed via right mini thoracotomy  . Seizures ([place])    last seizure 1 year ago  . Severe mitral regurgitation 02/20/2013  . Unsteady gait     Social History   Socioeconomic History  . Marital status: Divorced    Spouse name: Not on file  . Number of children: 2  . Years of education: 9  . Highest education level: Not on file  Occupational History  . Occupation: disabled  Tobacco Use  . Smoking status: Former Smoker    Packs/day: 2.00    Years: 45.00    Pack years: 90.00    Types: Cigarettes    Quit date: 07/30/2006    Years since quitting: 12.9  . Smokeless tobacco: Never Used  Substance and Sexual Activity  . Alcohol use: No    Alcohol/week: 0.0 standard drinks    Comment: previous alcohol use & home-made alcohol consumption  .  Drug use: No  . Sexual activity: Not Currently  Other Topics Concern  . Not on file  Social History Narrative   Patient is divorced and has 2 children.   Patient is right handed.   Patient has 9 th grade education.    Patient drinks diet sodas.      [facility]:   From [place] originally. Always lived in [place]. 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 Previously traveled to  [place], [place], & [place]. No pets currently. Remote pet owl and parakeet exposure. No mold exposure. Enjoys fishing.    Social Determinants of Health   Financial Resource Strain:   . Difficulty of Paying Living Expenses: Not on file  Food Insecurity:   . Worried About Charity fundraiser in the Last Year: Not on file  . Ran Out of Food in the Last Year: Not on file  Transportation Needs:   . Lack of Transportation (Medical): Not on file  . Lack of Transportation (Non-Medical): Not on file  Physical Activity:   . Days of Exercise per Week: Not on file  . Minutes of Exercise per Session: Not on file  Stress:   . Feeling of Stress : Not on file  Social Connections:   . Frequency of Communication with Friends and Family: Not on file  . Frequency of Social Gatherings with Friends and Family: Not on file  . Attends Religious Services: Not on file  . Active Member of Clubs or Organizations: Not on file  . Attends Archivist Meetings: Not on file  . Marital Status: Not on file  Intimate Partner Violence:   . Fear of Current or Ex-Partner: Not on file  . Emotionally Abused: Not on file  . Physically Abused: Not on file  . Sexually Abused: Not on file    Past Surgical History:  Procedure Laterality Date  . CARDIAC VALVE REPLACEMENT    . INTRAOPERATIVE TRANSESOPHAGEAL ECHOCARDIOGRAM N/A 03/24/2013   Procedure: INTRAOPERATIVE TRANSESOPHAGEAL ECHOCARDIOGRAM;  Surgeon: Rexene Alberts, MD;  Location: [place];  Service: Open Heart Surgery;  Laterality: N/A;  . MINIMALLY INVASIVE MAZE PROCEDURE N/A 03/24/2013   Procedure: MINIMALLY INVASIVE MAZE PROCEDURE;  Surgeon: Rexene Alberts, MD;  Location: [place];  Service: Open Heart Surgery;  Laterality: N/A;  . MITRAL VALVE REPLACEMENT  02/01/1998   Carpentier-Edwards porcine bioprosthetic tissue valve, size 2mm, placed for complicated bacterial endocarditis  . MITRAL VALVE REPLACEMENT Right 03/24/2013   Procedure: MINIMALLY INVASIVE REDO MITRAL VALVE  (MV) REPLACEMENT;  Surgeon: Rexene Alberts, MD;  Location: [place];  Service: Open Heart Surgery;  Laterality: Right;  . PROSTATE BIOPSY    . radiation treatment tx for prostate cancer  2015  . STERNAL WIRES REMOVAL N/A 07/10/2018   Procedure: STERNAL WIRES REMOVAL;  Surgeon: Rexene Alberts, MD;  Location: [place];  Service: Thoracic;  Laterality: N/A;  . TEE WITHOUT CARDIOVERSION N/A 02/12/2013   Procedure: TRANSESOPHAGEAL ECHOCARDIOGRAM (TEE);  Surgeon: Fay Records, MD;  [facility];  Service: Cardiovascular;  Laterality: N/A;  . VIDEO ASSISTED THORACOSCOPY  04/29/2007   Left VATS w/ mini thoracotomy for Left Lower Lobectomy for benign lung nodules    Family History  Problem Relation Age of Onset  . Cancer Father        prostate cancer  . Tuberculosis Father   . Heart attack Father   . Cancer Brother        prostate cancer  . Arthritis Mother   .  Lung disease Neg Hx     Allergies  Allergen Reactions  . Ace Inhibitors Swelling  . Lisinopril     Seizures   . Rosuvastatin Other (See Comments)    Muscle aches    Current Outpatient Medications on File Prior to Visit  Medication Sig Dispense Refill  . Cholecalciferol (VITAMIN D3) 1000 UNITS CAPS Take 1,000 Units by mouth daily.     Marland Kitchen ezetimibe (ZETIA) 10 MG tablet TAKE 1 TABLET EVERY DAY 90 tablet 3  . levETIRAcetam (KEPPRA) 500 MG tablet TAKE 1 TABLET (500 MG TOTAL) BY MOUTH 2 (TWO) TIMES DAILY. 180 tablet 3  . metoprolol tartrate (LOPRESSOR) 25 MG tablet TAKE 1/2 TABLET TWICE DAILY 90 tablet 3  . phenytoin (DILANTIN) 100 MG ER capsule TAKE 3 CAPSULES (300MG ) AT BEDTIME 270 capsule 3  . temazepam (RESTORIL) 15 MG capsule Take 15 mg by mouth at bedtime as needed for sleep.   3  . tiotropium (SPIRIVA HANDIHALER) 18 MCG inhalation capsule INHALE THE CONTENTS OF 1 CAPSULE EVERY DAY VIA HANDIHALER ( NEED MD APPOINTMENT FOR REFILLS ) 90 capsule 3  . tiZANidine (ZANAFLEX) 4 MG tablet TAKE 1 TABLET EVERY 6 HOURS AS NEEDED FOR  MUSCLE SPASMS 360 tablet 1  . traMADol (ULTRAM) 50 MG tablet TAKE 1 TABLET BY MOUTH EVERY 8 HOURS AS NEEDED 90 tablet 1  . warfarin (COUMADIN) 6 MG tablet TAKE AS DIRECTED BY COUMADIN CLINIC 80 tablet 0   No current facility-administered medications on file prior to visit.    BP (!) 148/70   Temp 97.8 F (36.6 C)   Wt 146 lb (66.2 kg)   BMI 25.06 kg/m       Objective:   Physical Exam Vitals and nursing note reviewed.  Constitutional:      Appearance: Normal appearance.  Skin:    General: Skin is dry.  Neurological:     General: No focal deficit present.     Mental Status: He is alert and oriented to person, place, and time.  Psychiatric:        Mood and Affect: Mood normal.        Behavior: Behavior normal.        Thought Content: Thought content normal.        Judgment: Judgment normal.       Assessment & Plan:  1. Long term (current) use of anticoagulants - Protime-INR  Dorothyann Peng, NP

## 2019-07-10 ENCOUNTER — Ambulatory Visit (INDEPENDENT_AMBULATORY_CARE_PROVIDER_SITE_OTHER): Payer: Medicare HMO | Admitting: *Deleted

## 2019-07-10 DIAGNOSIS — I4891 Unspecified atrial fibrillation: Secondary | ICD-10-CM | POA: Diagnosis not present

## 2019-07-10 DIAGNOSIS — Z5181 Encounter for therapeutic drug level monitoring: Secondary | ICD-10-CM

## 2019-07-10 LAB — PROTIME-INR
INR: 2.8 — ABNORMAL HIGH
Prothrombin Time: 27.3 s — ABNORMAL HIGH (ref 9.0–11.5)

## 2019-07-14 DIAGNOSIS — H26491 Other secondary cataract, right eye: Secondary | ICD-10-CM | POA: Diagnosis not present

## 2019-07-14 DIAGNOSIS — Z961 Presence of intraocular lens: Secondary | ICD-10-CM | POA: Diagnosis not present

## 2019-07-14 DIAGNOSIS — H353131 Nonexudative age-related macular degeneration, bilateral, early dry stage: Secondary | ICD-10-CM | POA: Diagnosis not present

## 2019-07-16 ENCOUNTER — Telehealth: Payer: Self-pay | Admitting: Adult Health

## 2019-07-16 NOTE — Telephone Encounter (Signed)
[facility] pharmacy called stating they sent a fax over for pt regarding a therapy alert on 07/10/2019. They have not received a response. Please advise.    Callback#: [email or web address]  Fax # [phone]

## 2019-07-16 NOTE — Telephone Encounter (Signed)
Form received and will fax back when completed.

## 2019-08-01 ENCOUNTER — Other Ambulatory Visit: Payer: Self-pay | Admitting: Adult Health

## 2019-08-03 NOTE — Telephone Encounter (Signed)
Last rx given on 10/7 for #90 with 1 ref

## 2019-08-20 ENCOUNTER — Ambulatory Visit (INDEPENDENT_AMBULATORY_CARE_PROVIDER_SITE_OTHER): Payer: Medicare HMO | Admitting: *Deleted

## 2019-08-20 ENCOUNTER — Other Ambulatory Visit: Payer: Self-pay

## 2019-08-20 DIAGNOSIS — I4891 Unspecified atrial fibrillation: Secondary | ICD-10-CM

## 2019-08-20 DIAGNOSIS — Z5181 Encounter for therapeutic drug level monitoring: Secondary | ICD-10-CM | POA: Diagnosis not present

## 2019-08-20 LAB — POCT INR: INR: 1.9 — AB (ref 2.0–3.0)

## 2019-08-20 NOTE — Patient Instructions (Signed)
Description   Today 1.5 tablets and tomorrow take 1 tablet then continue taking 1/2 tablet daily except 1 tablet on Tuesdays, Thursdays, Saturdays.  Recheck INR in 3 weeks. Coumadin Clinic [phone]

## 2019-08-25 DIAGNOSIS — Z8546 Personal history of malignant neoplasm of prostate: Secondary | ICD-10-CM | POA: Diagnosis not present

## 2019-08-26 ENCOUNTER — Other Ambulatory Visit: Payer: Self-pay | Admitting: Adult Health

## 2019-08-26 DIAGNOSIS — Z760 Encounter for issue of repeat prescription: Secondary | ICD-10-CM

## 2019-08-27 LAB — PSA: PSA: 0.016

## 2019-09-03 ENCOUNTER — Other Ambulatory Visit: Payer: Self-pay | Admitting: Cardiology

## 2019-09-10 ENCOUNTER — Ambulatory Visit (INDEPENDENT_AMBULATORY_CARE_PROVIDER_SITE_OTHER): Payer: Medicare HMO | Admitting: *Deleted

## 2019-09-10 ENCOUNTER — Other Ambulatory Visit: Payer: Self-pay

## 2019-09-10 DIAGNOSIS — I6523 Occlusion and stenosis of bilateral carotid arteries: Secondary | ICD-10-CM

## 2019-09-10 DIAGNOSIS — I4891 Unspecified atrial fibrillation: Secondary | ICD-10-CM

## 2019-09-10 DIAGNOSIS — Z5181 Encounter for therapeutic drug level monitoring: Secondary | ICD-10-CM | POA: Diagnosis not present

## 2019-09-10 LAB — POCT INR: INR: 3.1 — AB (ref 2.0–3.0)

## 2019-09-10 NOTE — Patient Instructions (Signed)
Description   Continue taking 1/2 tablet daily except 1 tablet on Tuesdays, Thursdays, Saturdays.  Recheck INR in 4 weeks. Coumadin Clinic [phone]

## 2019-10-03 ENCOUNTER — Other Ambulatory Visit: Payer: Self-pay | Admitting: Adult Health

## 2019-10-08 ENCOUNTER — Ambulatory Visit (INDEPENDENT_AMBULATORY_CARE_PROVIDER_SITE_OTHER): Payer: Medicare HMO | Admitting: *Deleted

## 2019-10-08 ENCOUNTER — Other Ambulatory Visit: Payer: Self-pay

## 2019-10-08 DIAGNOSIS — Z5181 Encounter for therapeutic drug level monitoring: Secondary | ICD-10-CM

## 2019-10-08 DIAGNOSIS — I4891 Unspecified atrial fibrillation: Secondary | ICD-10-CM

## 2019-10-08 LAB — POCT INR: INR: 4.1 — AB (ref 2.0–3.0)

## 2019-10-08 NOTE — Patient Instructions (Signed)
Description   Hold today, then continue taking 1/2 tablet daily except 1 tablet on Tuesdays, Thursdays, Saturdays.  Recheck INR in 3 weeks. Coumadin Clinic [phone]

## 2019-10-15 ENCOUNTER — Telehealth: Payer: Self-pay | Admitting: Adult Health

## 2019-10-15 NOTE — Telephone Encounter (Signed)
Pt notified that rx was sent in on 10/06/19.

## 2019-10-15 NOTE — Telephone Encounter (Signed)
Medication Refill: Tramadol Pharmacy: [facility] Phone:

## 2019-10-29 ENCOUNTER — Other Ambulatory Visit: Payer: Self-pay

## 2019-10-29 ENCOUNTER — Ambulatory Visit (INDEPENDENT_AMBULATORY_CARE_PROVIDER_SITE_OTHER): Payer: Medicare HMO

## 2019-10-29 DIAGNOSIS — Z5181 Encounter for therapeutic drug level monitoring: Secondary | ICD-10-CM

## 2019-10-29 DIAGNOSIS — I4891 Unspecified atrial fibrillation: Secondary | ICD-10-CM

## 2019-10-29 LAB — POCT INR: INR: 5.5 — AB (ref 2.0–3.0)

## 2019-10-29 NOTE — Patient Instructions (Signed)
Description   Skip today and tomorrow's dosage of Coumadin, then start taking 1/2 tablet daily except 1 tablet on Tuesdays and Saturdays.  Recheck INR in 2 weeks. Coumadin Clinic [phone]

## 2019-11-04 ENCOUNTER — Other Ambulatory Visit: Payer: Self-pay | Admitting: Neurology

## 2019-11-05 ENCOUNTER — Ambulatory Visit (INDEPENDENT_AMBULATORY_CARE_PROVIDER_SITE_OTHER): Payer: Medicare HMO | Admitting: Adult Health

## 2019-11-05 ENCOUNTER — Encounter: Payer: Self-pay | Admitting: Adult Health

## 2019-11-05 ENCOUNTER — Other Ambulatory Visit: Payer: Self-pay

## 2019-11-05 VITALS — BP 138/78 | HR 57 | Temp 98.5°F | Ht 64.0 in | Wt 146.0 lb

## 2019-11-05 DIAGNOSIS — R569 Unspecified convulsions: Secondary | ICD-10-CM

## 2019-11-05 DIAGNOSIS — Z5181 Encounter for therapeutic drug level monitoring: Secondary | ICD-10-CM | POA: Diagnosis not present

## 2019-11-05 DIAGNOSIS — Z79899 Other long term (current) drug therapy: Secondary | ICD-10-CM | POA: Diagnosis not present

## 2019-11-05 DIAGNOSIS — Z8673 Personal history of transient ischemic attack (TIA), and cerebral infarction without residual deficits: Secondary | ICD-10-CM | POA: Diagnosis not present

## 2019-11-05 MED ORDER — LEVETIRACETAM 500 MG PO TABS: 500.0000 mg | ORAL_TABLET | Freq: Two times a day (BID) | ORAL | 3 refills | Status: DC

## 2019-11-05 NOTE — Patient Instructions (Signed)
Your Plan:  Continue Keppra and Dilantin at current dosages -we will provide refills  Check lab work including CBC, CMP, phenytoin level and vitamin D level   Follow-up in 1 year or call earlier if needed     Thank you for coming to see [place] at [facility]. I hope we have been able to provide you high quality care today.  You may receive a patient satisfaction survey over the next few weeks. We would appreciate your feedback and comments so that we may continue to improve ourselves and the health of our patients.

## 2019-11-05 NOTE — Progress Notes (Signed)
I agree with the above plan 

## 2019-11-05 NOTE — Progress Notes (Signed)
PATIENT: Jeffery Copeland DOB: 12/07/1945  REASON FOR VISIT: follow up for stroke and seizure history HISTORY FROM: patient    Chief complaint: Chief Complaint  Patient presents with  . Follow-up    treatment rm, seizures, alone     HISTORY OF PRESENT ILLNESS:  Today, on 11/05/2019, Jeffery Copeland is being seen today for history of stroke and seizures.  He has been stable since prior visit 1 year ago -denies seizures or stroke symptoms. Ambulate with rolling walker maintaining ADLs independently.  Continues on Keppra and Dilantin -denies side effects.  Continues on warfarin for history of atrial fibrillation and secondary stroke prevention with stable INR levels.  Blood pressure today satisfactory 138/78.  No concerns at this time.     History: Copied from prior note for reference purposes only Update 11/03/2018 JM: Jeffery Copeland continues to be followed in this office for seizure and medication management with one-year follow-up visit scheduled today.  He continues on Keppra and Dilantin which she tolerates well without any recurrent seizure activity.  At prior visit, 10/22/2017, lab work obtained which were all satisfactory.  He continues to complete all ADLs and IADLs independently but he does not drive at this time.  He continues on warfarin without side effects of bleeding or bruising with most recent INR level elevated at 4.1 with goal 2.5-3.5. He plans on obtaining repeat level on 11/17/18.  He continues to receive routine lab work and management through anticoagulation clinic.  Denies any new or recurrent stroke/TIA symptoms.   Update 10/22/2017 MM: Jeffery Copeland is a 74 year old male with a history of seizures and stroke.  He returns today for follow-up.  He remains on Keppra and Dilantin.  He denies any seizure events.  He does not operate a motor vehicle.  He is able to complete all ADLs independently.  He denies any changes with his gait or balance.  Denies any recent falls.  He remains on Coumadin.   Reports that his INR numbers have remained stable.  He denies any strokelike symptoms.  He returns today for evaluation.  Update 04/24/17 MM: Jeffery Copeland is a 74 year old male with a history of seizure events. He returns today for follow-up. He remains on Keppra 500 mg twice a day as well as Dilantin 100 mg at bedtime. He denies any seizure events. He lives at home alone. He is able to complete all ADLs independently. He does not operate a motor vehicle. He reports that his hip is better but still sore. He continues to use a walker when ambulating. He denies any new neurological symptoms. He returns today for an evaluation.   REVIEW OF SYSTEMS: Out of a complete 14 system review of symptoms, the patient complains only of the following symptoms, and all other reviewed systems are negative. No complaints   ALLERGIES: Allergies  Allergen Reactions  . Ace Inhibitors Swelling  . Lisinopril     Seizures   . Rosuvastatin Other (See Comments)    Muscle aches    HOME MEDICATIONS: Outpatient Medications Prior to Visit  Medication Sig Dispense Refill  . ezetimibe (ZETIA) 10 MG tablet TAKE 1 TABLET EVERY DAY 90 tablet 3  . levETIRAcetam (KEPPRA) 500 MG tablet TAKE 1 TABLET (500 MG TOTAL) BY MOUTH 2 (TWO) TIMES DAILY. 180 tablet 3  . metoprolol tartrate (LOPRESSOR) 25 MG tablet TAKE 1/2 TABLET TWICE DAILY 90 tablet 3  . phenytoin (DILANTIN) 100 MG ER capsule TAKE 3 CAPSULES (300MG ) AT BEDTIME 270 capsule 3  .  tiotropium (SPIRIVA HANDIHALER) 18 MCG inhalation capsule INHALE THE CONTENTS OF 1 CAPSULE EVERY DAY VIA HANDIHALER ( NEED MD APPOINTMENT FOR REFILLS ) 90 capsule 3  . tiZANidine (ZANAFLEX) 4 MG tablet TAKE 1 TABLET EVERY 6 HOURS AS NEEDED FOR MUSCLE SPASMS 360 tablet 1  . traMADol (ULTRAM) 50 MG tablet TAKE 1 TABLET BY MOUTH EVERY 8 HOURS AS NEEDED 90 tablet 2  . warfarin (COUMADIN) 6 MG tablet TAKE AS DIRECTED BY COUMADIN CLINIC 75 tablet 0  . Cholecalciferol (VITAMIN D3) 1000 UNITS CAPS Take  1,000 Units by mouth daily.     . temazepam (RESTORIL) 15 MG capsule Take 15 mg by mouth at bedtime as needed for sleep.   3   No facility-administered medications prior to visit.    PAST MEDICAL HISTORY: Past Medical History:  Diagnosis Date  . Acute and subacute bacterial endocarditis 1999   group G Streptococcus  . Atrial fibrillation ([place]) 11/11/2008   Qualifier: Diagnosis of  By: Lia Foyer, MD, Jaquelyn Bitter   . CARDIOMYOPATHY 10/20/2008   Qualifier: Diagnosis of  By: Owens Shark, RN, BSN, Lauren    . Cerebrovascular disease, unspecified   . Closed right acetabular fracture ([place])   . COPD (chronic obstructive pulmonary disease) ([place]) 1999  . Epidural hematoma ([place]) 03/01/2011   Recent fall 2012   . Esophageal reflux   . Generalized osteoarthrosis, unspecified site   . History of tobacco abuse   . Hypertension   . HYPERTENSION, BENIGN 10/20/2008   Qualifier: Diagnosis of  By: Owens Shark, RN, BSN, Lauren    . Hyponatremia   . Insomnia   . Iron deficiency anemia, unspecified   . ISCHEMIC COLITIS, HX OF 10/20/2008   Qualifier: Diagnosis of  By: Owens Shark, RN, BSN, Lauren    . Lung nodules 2008, 2014   LLL nodule removed 2008 with LLL lobectomy,  RLL nodule 29mm observation  . Prostate cancer ([place]) 2015  . Protruding sternal wires   . S/P mitral valve replacement 02/01/1998   Carpentier-Edwards porcine bioprosthetic tissue valve, size 47mm Placed for acute and subacute bacterial endocarditis (group G Streptococcus) with pre-existing mitral valve prolapse   . S/P mitral valve replacement with bioprosthetic valve 03/24/2013   Redo mitral valve replacement using 60mm [facility] mitral bovine bioprosthetic tissue valve performed via right mini thoracotomy  . Seizures ([place])    last seizure 1 year ago  . Severe mitral regurgitation 02/20/2013  . Unsteady gait     PAST SURGICAL HISTORY: Past Surgical History:  Procedure Laterality Date  . CARDIAC VALVE REPLACEMENT    . INTRAOPERATIVE  TRANSESOPHAGEAL ECHOCARDIOGRAM N/A 03/24/2013   Procedure: INTRAOPERATIVE TRANSESOPHAGEAL ECHOCARDIOGRAM;  Surgeon: Rexene Alberts, MD;  Location: [place];  Service: Open Heart Surgery;  Laterality: N/A;  . MINIMALLY INVASIVE MAZE PROCEDURE N/A 03/24/2013   Procedure: MINIMALLY INVASIVE MAZE PROCEDURE;  Surgeon: Rexene Alberts, MD;  Location: [place];  Service: Open Heart Surgery;  Laterality: N/A;  . MITRAL VALVE REPLACEMENT  02/01/1998   Carpentier-Edwards porcine bioprosthetic tissue valve, size 89mm, placed for complicated bacterial endocarditis  . MITRAL VALVE REPLACEMENT Right 03/24/2013   Procedure: MINIMALLY INVASIVE REDO MITRAL VALVE (MV) REPLACEMENT;  Surgeon: Rexene Alberts, MD;  Location: [place];  Service: Open Heart Surgery;  Laterality: Right;  . PROSTATE BIOPSY    . radiation treatment tx for prostate cancer  2015  . STERNAL WIRES REMOVAL N/A 07/10/2018   Procedure: STERNAL WIRES REMOVAL;  Surgeon: Rexene Alberts, MD;  Location: [place];  Service: Thoracic;  Laterality: N/A;  . TEE WITHOUT CARDIOVERSION N/A 02/12/2013   Procedure: TRANSESOPHAGEAL ECHOCARDIOGRAM (TEE);  Surgeon: Fay Records, MD;  Location: [facility] ENDOSCOPY;  Service: Cardiovascular;  Laterality: N/A;  . VIDEO ASSISTED THORACOSCOPY  04/29/2007   Left VATS w/ mini thoracotomy for Left Lower Lobectomy for benign lung nodules    FAMILY HISTORY: Family History  Problem Relation Age of Onset  . Cancer Father        prostate cancer  . Tuberculosis Father   . Heart attack Father   . Cancer Brother        prostate cancer  . Arthritis Mother   . Lung disease Neg Hx     SOCIAL HISTORY: Social History   Socioeconomic History  . Marital status: Divorced    Spouse name: Not on file  . Number of children: 2  . Years of education: 9  . Highest education level: Not on file  Occupational History  . Occupation: disabled  Tobacco Use  . Smoking status: Former Smoker    Packs/day: 2.00    Years: 45.00    Pack years:  90.00    Types: Cigarettes    Quit date: 07/30/2006    Years since quitting: 13.2  . Smokeless tobacco: Never Used  Substance and Sexual Activity  . Alcohol use: No    Alcohol/week: 0.0 standard drinks    Comment: previous alcohol use & home-made alcohol consumption  . Drug use: No  . Sexual activity: Not Currently  Other Topics Concern  . Not on file  Social History Narrative   Patient is divorced and has 2 children.   Patient is right handed.   Patient has 9 th grade education.    Patient drinks diet sodas.      [place] [facility]:   From [place] originally. Always lived in [place]. 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 Previously traveled to [place], [place], & [place]. No pets currently. Remote pet owl and parakeet exposure. No mold exposure. Enjoys fishing.    Social Determinants of Health   Financial Resource Strain:   . Difficulty of Paying Living Expenses:   Food Insecurity:   . Worried About Charity fundraiser in the Last Year:   . Arboriculturist in the Last Year:   Transportation Needs:   . Film/video editor (Medical):   Marland Kitchen Lack of Transportation (Non-Medical):   Physical Activity:   . Days of Exercise per Week:   . Minutes of Exercise per Session:   Stress:   . Feeling of Stress :   Social Connections:   . Frequency of Communication with Friends and Family:   . Frequency of Social Gatherings with Friends and Family:   . Attends Religious Services:   . Active Member of Clubs or Organizations:   . Attends Archivist Meetings:   Marland Kitchen Marital Status:   Intimate Partner Violence:   . Fear of Current or Ex-Partner:   . Emotionally Abused:   Marland Kitchen Physically Abused:   . Sexually Abused:       PHYSICAL EXAM  Vitals:   11/05/19 0850  BP: 138/78  Pulse: (!) 57  Temp: 98.5 F (36.9 C)  Weight: 146 lb (66.2 kg)  Height: 5\' 4"  (1.626 m)   Body mass index is 25.06 kg/m.  General: well developed, well nourished,  very pleasant elderly  Caucasian male, seated, in no evident distress   Neurologic Exam Mental Status: Awake and fully alert.  Oriented to place and time. Recent and remote memory intact. Attention span, concentration and fund of knowledge appropriate. Mood and affect appropriate.  Cranial Nerves: Pupils equal, briskly reactive to light. Extraocular movements full without nystagmus. Visual fields full to confrontation. Hearing intact. Facial sensation intact. Face, tongue, palate moves normally and symmetrically.  Motor: Normal bulk and tone. Normal strength in all tested extremity muscles. Sensory.: intact to touch , pinprick , position and vibratory sensation.  Coordination: Rapid alternating movements normal in all extremities. Finger-to-nose and heel-to-shin performed accurately bilaterally. Gait and Station: Arises from chair without difficulty. Stance is hunched. Gait demonstrates normal stride length and balance with use of rolling walker Reflexes: 1+ and symmetric. Toes downgoing.     DIAGNOSTIC DATA (LABS, IMAGING, TESTING) - I reviewed patient records, labs, notes, testing and imaging myself where available.  Lab Results  Component Value Date   WBC 5.5 02/06/2019   HGB 12.7 (L) 02/06/2019   HCT 38.3 (L) 02/06/2019   MCV 98.0 02/06/2019   PLT 178.0 02/06/2019      Component Value Date/Time   NA 138 02/06/2019 1031   NA 139 10/22/2017 1050   K 4.5 02/06/2019 1031   CL 101 02/06/2019 1031   CO2 28 02/06/2019 1031   GLUCOSE 86 02/06/2019 1031   BUN 6 02/06/2019 1031   BUN 6 (L) 10/22/2017 1050   CREATININE 1.06 02/06/2019 1031   CREATININE 1.07 08/12/2015 1500   CALCIUM 9.0 02/06/2019 1031   PROT 6.6 02/06/2019 1031   PROT 6.9 02/28/2018 0948   ALBUMIN 4.4 02/06/2019 1031   ALBUMIN 4.3 02/28/2018 0948   AST 19 02/06/2019 1031   ALT 22 02/06/2019 1031   ALKPHOS 131 (H) 02/06/2019 1031   BILITOT 0.6 02/06/2019 1031   BILITOT 0.4 02/28/2018 0948   GFRNONAA 57 (L) 07/10/2018 1245   GFRAA >60  07/10/2018 1245   Lab Results  Component Value Date   CHOL 137 02/06/2019   HDL 61.90 02/06/2019   LDLCALC 62 02/06/2019   TRIG 63.0 02/06/2019   CHOLHDL 2 02/06/2019   Lab Results  Component Value Date   HGBA1C 5.4 03/20/2013   No results found for: VITAMINB12 Lab Results  Component Value Date   TSH 1.36 02/06/2019      ASSESSMENT AND PLAN 74 y.o. year old male  has a past medical history of Acute and subacute bacterial endocarditis (1999), Atrial fibrillation ([place]) (11/11/2008), CARDIOMYOPATHY (10/20/2008), Cerebrovascular disease, unspecified, Closed right acetabular fracture ([place]), COPD (chronic obstructive pulmonary disease) ([place]) (1999), Epidural hematoma ([place]) (03/01/2011), Esophageal reflux, Generalized osteoarthrosis, unspecified site, History of tobacco abuse, Hypertension, HYPERTENSION, BENIGN (10/20/2008), Hyponatremia, Insomnia, Iron deficiency anemia, unspecified, ISCHEMIC COLITIS, HX OF (10/20/2008), Lung nodules (2008, 2014), Prostate cancer ([place]) (2015), Protruding sternal wires, S/P mitral valve replacement (02/01/1998), S/P mitral valve replacement with bioprosthetic valve (03/24/2013), Seizures ([place]), Severe mitral regurgitation (02/20/2013), and Unsteady gait. here with:  1.  Seizures 2.  History of stroke  Overall the patient has done well.  He will continue on Keppra and Dilantin for seizure prophylaxis - will provide refills.  We will check lab work today for medication management.  Patient has not had any strokelike symptoms.  He remains on Coumadin for stroke prevention and history of atrial fibrillation.  He is advised that if his symptoms worsen or he develops new symptoms he should let us know.    Follow-up in 1 year or call earlier as needed    I spent 22 minutes of face-to-face and non-face-to-face  time with patient.  This included previsit chart review, lab review, study review, order entry, electronic health record documentation, patient  education    Frann Rider, [facility]  [facility] [address] [place], [place] [address]  Phone [phone] Fax [phone] Note: This document was prepared with digital dictation and possible smart phrase technology. Any transcriptional errors that result from this process are unintentional.

## 2019-11-09 ENCOUNTER — Other Ambulatory Visit: Payer: Self-pay | Admitting: Cardiology

## 2019-11-09 LAB — SPECIMEN STATUS REPORT

## 2019-11-12 ENCOUNTER — Other Ambulatory Visit: Payer: Self-pay

## 2019-11-12 ENCOUNTER — Ambulatory Visit (INDEPENDENT_AMBULATORY_CARE_PROVIDER_SITE_OTHER): Payer: Medicare HMO | Admitting: *Deleted

## 2019-11-12 DIAGNOSIS — I4891 Unspecified atrial fibrillation: Secondary | ICD-10-CM

## 2019-11-12 DIAGNOSIS — Z5181 Encounter for therapeutic drug level monitoring: Secondary | ICD-10-CM | POA: Diagnosis not present

## 2019-11-12 LAB — SPECIMEN STATUS REPORT

## 2019-11-12 LAB — PHENYTOIN LEVEL, TOTAL: Phenytoin (Dilantin), Serum: 10.8 ug/mL (ref 10.0–20.0)

## 2019-11-12 LAB — POCT INR: INR: 3.5 — AB (ref 2.0–3.0)

## 2019-11-12 NOTE — Patient Instructions (Signed)
Description   Continue taking 1/2 tablet daily except 1 tablet on Tuesdays and Saturdays.  Recheck INR in 3 weeks. Coumadin Clinic [phone]

## 2019-11-13 LAB — COMPREHENSIVE METABOLIC PANEL
ALT: 15 IU/L (ref 0–44)
AST: 19 IU/L (ref 0–40)
Albumin/Globulin Ratio: 2.3 — ABNORMAL HIGH (ref 1.2–2.2)
Albumin: 4.5 g/dL (ref 3.7–4.7)
Alkaline Phosphatase: 118 IU/L — ABNORMAL HIGH (ref 39–117)
BUN/Creatinine Ratio: 8 — ABNORMAL LOW (ref 10–24)
BUN: 10 mg/dL (ref 8–27)
Bilirubin Total: 0.3 mg/dL (ref 0.0–1.2)
CO2: 26 mmol/L (ref 20–29)
Calcium: 9.3 mg/dL (ref 8.6–10.2)
Chloride: 99 mmol/L (ref 96–106)
Creatinine, Ser: 1.23 mg/dL (ref 0.76–1.27)
GFR calc Af Amer: 67 mL/min/{1.73_m2} (ref >59)
GFR calc non Af Amer: 58 mL/min/{1.73_m2} — ABNORMAL LOW (ref >59)
Globulin, Total: 2 g/dL (ref 1.5–4.5)
Glucose: 109 mg/dL — ABNORMAL HIGH (ref 65–99)
Potassium: 5 mmol/L (ref 3.5–5.2)
Sodium: 138 mmol/L (ref 134–144)
Total Protein: 6.5 g/dL (ref 6.0–8.5)

## 2019-11-13 LAB — VITAMIN D 1,25 DIHYDROXY
Vitamin D 1, 25 (OH)2 Total: 25 pg/mL
Vitamin D2 1, 25 (OH)2: <10 pg/mL
Vitamin D3 1, 25 (OH)2: 25 pg/mL

## 2019-11-13 LAB — CBC
Hematocrit: 37.4 % — ABNORMAL LOW (ref 37.5–51.0)
Hemoglobin: 12.3 g/dL — ABNORMAL LOW (ref 13.0–17.7)
MCH: 31.7 pg (ref 26.6–33.0)
MCHC: 32.9 g/dL (ref 31.5–35.7)
MCV: 96 fL (ref 79–97)
Platelets: 201 10*3/uL (ref 150–450)
RBC: 3.88 x10E6/uL — ABNORMAL LOW (ref 4.14–5.80)
RDW: 12.3 % (ref 11.6–15.4)
WBC: 6 10*3/uL (ref 3.4–10.8)

## 2019-11-13 LAB — PHENOBARBITAL LEVEL: Phenobarbital, Serum: <3 ug/mL — ABNORMAL LOW (ref 15–40)

## 2019-11-16 ENCOUNTER — Telehealth: Payer: Self-pay | Admitting: *Deleted

## 2019-11-16 NOTE — Telephone Encounter (Signed)
Frann Rider, NP  11/16/2019 7:50 AM EDT    Please advise patient that recent vitamin D level satisfactory    I spoke with pt and let him know his recent labs were unremarkable, Vitamin D level satisfactory. No concerns on labs noted by Janett Billow NP. Pt verbalized appreciation. His questions were answered.

## 2019-11-16 NOTE — Telephone Encounter (Signed)
-----   Message from Frann Rider, NP sent at 11/12/2019 12:45 PM EDT ----- Please advise patient that recent lab work largely unremarkable.  Currently awaiting vitamin D levels at this time I will follow-up with available -would you be able to reach out to [facility] to see what is going on with these results?  Thank you.

## 2019-11-25 ENCOUNTER — Other Ambulatory Visit: Payer: Self-pay | Admitting: Adult Health

## 2019-11-26 NOTE — Telephone Encounter (Signed)
SENT TO THE PHARMACY FOR 1 YEAR ON 02/18/2019.  REQUEST IS TOO EARLY.

## 2019-12-03 ENCOUNTER — Other Ambulatory Visit: Payer: Self-pay

## 2019-12-03 ENCOUNTER — Ambulatory Visit (INDEPENDENT_AMBULATORY_CARE_PROVIDER_SITE_OTHER): Payer: Medicare HMO | Admitting: Pharmacist

## 2019-12-03 DIAGNOSIS — I4891 Unspecified atrial fibrillation: Secondary | ICD-10-CM | POA: Diagnosis not present

## 2019-12-03 DIAGNOSIS — Z5181 Encounter for therapeutic drug level monitoring: Secondary | ICD-10-CM

## 2019-12-03 LAB — POCT INR: INR: 3.2 — AB (ref 2.0–3.0)

## 2019-12-03 NOTE — Patient Instructions (Signed)
Continue taking 1/2 tablet daily except 1 tablet on Tuesdays and Saturdays.  Recheck INR in 4 weeks. Coumadin Clinic [phone]

## 2019-12-08 DIAGNOSIS — H26492 Other secondary cataract, left eye: Secondary | ICD-10-CM | POA: Diagnosis not present

## 2019-12-17 ENCOUNTER — Other Ambulatory Visit: Payer: Self-pay | Admitting: Family Medicine

## 2019-12-17 MED ORDER — TRAMADOL HCL 50 MG PO TABS: 50.0000 mg | ORAL_TABLET | Freq: Three times a day (TID) | ORAL | 2 refills | Status: DC | PRN

## 2019-12-31 ENCOUNTER — Other Ambulatory Visit: Payer: Self-pay

## 2019-12-31 ENCOUNTER — Ambulatory Visit (INDEPENDENT_AMBULATORY_CARE_PROVIDER_SITE_OTHER): Payer: Medicare HMO | Admitting: Pharmacist

## 2019-12-31 DIAGNOSIS — I4891 Unspecified atrial fibrillation: Secondary | ICD-10-CM

## 2019-12-31 DIAGNOSIS — Z5181 Encounter for therapeutic drug level monitoring: Secondary | ICD-10-CM

## 2019-12-31 LAB — POCT INR: INR: 3.3 — AB (ref 2.0–3.0)

## 2019-12-31 NOTE — Patient Instructions (Addendum)
  Description   Continue taking 1/2 tablet daily except 1 tablet on Tuesdays and Saturdays.  Recheck INR in 6 weeks. Coumadin Clinic [phone]

## 2020-01-04 ENCOUNTER — Other Ambulatory Visit: Payer: Self-pay | Admitting: Adult Health

## 2020-01-04 DIAGNOSIS — Z760 Encounter for issue of repeat prescription: Secondary | ICD-10-CM

## 2020-01-06 ENCOUNTER — Other Ambulatory Visit: Payer: Self-pay

## 2020-01-07 ENCOUNTER — Ambulatory Visit (INDEPENDENT_AMBULATORY_CARE_PROVIDER_SITE_OTHER): Payer: Medicare HMO | Admitting: Adult Health

## 2020-01-07 ENCOUNTER — Encounter: Payer: Self-pay | Admitting: Adult Health

## 2020-01-07 NOTE — Patient Instructions (Addendum)
Please follow up after July 10th for your physical exam

## 2020-01-11 ENCOUNTER — Other Ambulatory Visit: Payer: Self-pay | Admitting: Adult Health

## 2020-01-11 DIAGNOSIS — Z760 Encounter for issue of repeat prescription: Secondary | ICD-10-CM

## 2020-01-13 NOTE — Telephone Encounter (Signed)
SENT TO [facility] ON 04/01/2019 FOR 1 YEAR.  REQUEST IS TOO EARLY.

## 2020-01-18 ENCOUNTER — Telehealth: Payer: Self-pay | Admitting: Adult Health

## 2020-01-18 NOTE — Telephone Encounter (Signed)
Medication Refill:  Tramadol   Pharmacy: [facility] Mail order FAX: [phone]

## 2020-01-19 NOTE — Telephone Encounter (Signed)
Pt stated that [employer] has tried sending a request for this medication but the pt is the one who called in.

## 2020-01-21 NOTE — Telephone Encounter (Signed)
Medication sent in last month plus two refills

## 2020-01-29 MED ORDER — TRAMADOL HCL 50 MG PO TABS: 50.0000 mg | ORAL_TABLET | Freq: Three times a day (TID) | ORAL | 2 refills | Status: DC | PRN

## 2020-01-29 NOTE — Telephone Encounter (Signed)
Pt called [facility] and they do not have a refill on file for Tramadol and the pt needs a refill sent through [facility] instead of [facility].   [place], [place] Phone:  [phone]  Fax:  [phone]

## 2020-01-29 NOTE — Addendum Note (Signed)
Addended by: Apolinar Junes on: 01/29/2020 12:10 PM   Modules accepted: Orders

## 2020-01-29 NOTE — Telephone Encounter (Signed)
Spoke to [facility] and cancelled his tramadol prescriptions that were on file.  Please send to [facility].  Thanks

## 2020-02-11 ENCOUNTER — Other Ambulatory Visit: Payer: Self-pay

## 2020-02-11 ENCOUNTER — Ambulatory Visit (INDEPENDENT_AMBULATORY_CARE_PROVIDER_SITE_OTHER): Payer: Medicare HMO | Admitting: Pharmacist

## 2020-02-11 DIAGNOSIS — Z5181 Encounter for therapeutic drug level monitoring: Secondary | ICD-10-CM | POA: Diagnosis not present

## 2020-02-11 DIAGNOSIS — Z7901 Long term (current) use of anticoagulants: Secondary | ICD-10-CM

## 2020-02-11 DIAGNOSIS — I4891 Unspecified atrial fibrillation: Secondary | ICD-10-CM | POA: Diagnosis not present

## 2020-02-11 LAB — POCT INR: INR: 3 (ref 2.0–3.0)

## 2020-02-11 NOTE — Patient Instructions (Signed)
Description   Continue taking 1/2 tablet daily except 1 tablet on Tuesdays and Saturdays.  Recheck INR in 6 weeks. Coumadin Clinic [phone]

## 2020-02-17 ENCOUNTER — Other Ambulatory Visit: Payer: Self-pay | Admitting: Cardiology

## 2020-02-17 ENCOUNTER — Other Ambulatory Visit: Payer: Self-pay | Admitting: Adult Health

## 2020-02-17 DIAGNOSIS — Z760 Encounter for issue of repeat prescription: Secondary | ICD-10-CM

## 2020-02-19 NOTE — Telephone Encounter (Signed)
Pt has CPX scheduled for 02/26/20.  Last filled 03/2019 for 1 year.  Will hold.

## 2020-02-22 DIAGNOSIS — R350 Frequency of micturition: Secondary | ICD-10-CM | POA: Diagnosis not present

## 2020-02-22 DIAGNOSIS — Z8546 Personal history of malignant neoplasm of prostate: Secondary | ICD-10-CM | POA: Diagnosis not present

## 2020-02-23 ENCOUNTER — Telehealth: Payer: Self-pay | Admitting: Adult Health

## 2020-02-23 NOTE — Telephone Encounter (Signed)
See telephone encounter from 02/23/20.  Ok to fill for 1 year.  Nothing further needed.

## 2020-02-23 NOTE — Telephone Encounter (Signed)
Tramadol filled on 01/29/20.  Zetia sent in for 1 year.  Nothing further needed.

## 2020-02-23 NOTE — Telephone Encounter (Signed)
Ok to refill for one year  

## 2020-02-23 NOTE — Telephone Encounter (Signed)
Pt call and stated he need a refill on ezetimibe (ZETIA) 10 MG tablet and traMADol (ULTRAM) 50 MG tablet sent to  [place], [place] Phone:  [phone]  Fax:  [phone]

## 2020-02-26 ENCOUNTER — Other Ambulatory Visit: Payer: Self-pay

## 2020-02-26 ENCOUNTER — Encounter: Payer: Self-pay | Admitting: Adult Health

## 2020-02-26 ENCOUNTER — Ambulatory Visit (INDEPENDENT_AMBULATORY_CARE_PROVIDER_SITE_OTHER): Payer: Medicare HMO | Admitting: Adult Health

## 2020-02-26 VITALS — BP 150/90 | Temp 98.1°F | Wt 142.0 lb

## 2020-02-26 DIAGNOSIS — I4891 Unspecified atrial fibrillation: Secondary | ICD-10-CM | POA: Diagnosis not present

## 2020-02-26 DIAGNOSIS — Z952 Presence of prosthetic heart valve: Secondary | ICD-10-CM

## 2020-02-26 DIAGNOSIS — I10 Essential (primary) hypertension: Secondary | ICD-10-CM | POA: Diagnosis not present

## 2020-02-26 DIAGNOSIS — M545 Low back pain, unspecified: Secondary | ICD-10-CM

## 2020-02-26 DIAGNOSIS — Z23 Encounter for immunization: Secondary | ICD-10-CM

## 2020-02-26 DIAGNOSIS — R569 Unspecified convulsions: Secondary | ICD-10-CM | POA: Diagnosis not present

## 2020-02-26 DIAGNOSIS — Z0000 Encounter for general adult medical examination without abnormal findings: Secondary | ICD-10-CM

## 2020-02-26 DIAGNOSIS — E782 Mixed hyperlipidemia: Secondary | ICD-10-CM

## 2020-02-26 DIAGNOSIS — C61 Malignant neoplasm of prostate: Secondary | ICD-10-CM | POA: Diagnosis not present

## 2020-02-26 DIAGNOSIS — J438 Other emphysema: Secondary | ICD-10-CM

## 2020-02-26 DIAGNOSIS — G8929 Other chronic pain: Secondary | ICD-10-CM

## 2020-02-26 NOTE — Addendum Note (Signed)
Addended by: Marrion Coy on: 02/26/2020 08:18 AM   Modules accepted: Orders

## 2020-02-26 NOTE — Progress Notes (Signed)
Subjective:    Patient ID: Jeffery Copeland, male    DOB: [birth year], 74 y.o.   MRN: [id number]  HPI Patient presents for yearly preventative medicine examination. He is a pleasant 74 year old male who  has a past medical history of Acute and subacute bacterial endocarditis (1999), Atrial fibrillation ([place]) (11/11/2008), CARDIOMYOPATHY (10/20/2008), Cerebrovascular disease, unspecified, Closed right acetabular fracture ([place]), COPD (chronic obstructive pulmonary disease) ([place]) (1999), Epidural hematoma ([place]) (03/01/2011), Esophageal reflux, Generalized osteoarthrosis, unspecified site, History of tobacco abuse, Hypertension, HYPERTENSION, BENIGN (10/20/2008), Hyponatremia, Insomnia, Iron deficiency anemia, unspecified, ISCHEMIC COLITIS, HX OF (10/20/2008), Lung nodules (2008, 2014), Prostate cancer ([place]) (2015), Protruding sternal wires, S/P mitral valve replacement (02/01/1998), S/P mitral valve replacement with bioprosthetic valve (03/24/2013), Seizures ([place]), Severe mitral regurgitation (02/20/2013), and Unsteady gait.  COPD -is followed by pulmonary.  Currently uses Spiriva inhaler daily.  He does feel well controlled overall but does get short of breath with exertion at times.   Seizure disorder-managed with Keppra 500 mg twice daily and Dilantin 300 mg daily.  Is seen by neurology.  He is tolerating his medications well and denies any recent seizure activity  A. fib/mitral valve replacement-bioprosthetic mitral valve replacement in 1999 with redo valve in 2014.  Currently on Coumadin statin therapy and rate controlled with metoprolol.  Hyperlipidemia-takes Zetia.  He denies myalgia or fatigue  Essential hypertension-prescribed metoprolol 12.5 mg twice daily.  Denies chest pain, headaches, blurred vision, lightheadedness, or syncopal episodes  Insomnia-takes restoral 15 mg as needed.  Reports restful sleep  Chronic Back Pain - does well with tramadol and zanaflex   All immunizations and health  maintenance protocols were reviewed with the patient and needed orders were placed.  Appropriate screening laboratory values were ordered for the patient including screening of hyperlipidemia, renal function and hepatic function. If indicated by BPH, a PSA was ordered.  Medication reconciliation,  past medical history, social history, problem list and allergies were reviewed in detail with the patient  Goals were established with regard to weight loss, exercise, and  diet in compliance with medications  He refuses to have a colonoscopy or Cologuard done.  He does not participate in routine dental and vision screens  Review of Systems  Constitutional: Negative.   HENT: Positive for hearing loss.   Eyes: Negative.   Respiratory: Negative.   Cardiovascular: Negative.   Gastrointestinal: Negative.   Endocrine: Negative.   Genitourinary: Negative.   Musculoskeletal: Positive for arthralgias, back pain and gait problem.  Skin: Negative.   Allergic/Immunologic: Negative.   Hematological: Negative.   Psychiatric/Behavioral: Negative.   All other systems reviewed and are negative.  Past Medical History:  Diagnosis Date  . Acute and subacute bacterial endocarditis 1999   group G Streptococcus  . Atrial fibrillation ([place]) 11/11/2008   Qualifier: Diagnosis of  By: Lia Foyer, MD, Jaquelyn Bitter   . CARDIOMYOPATHY 10/20/2008   Qualifier: Diagnosis of  By: Owens Shark, RN, BSN, Lauren    . Cerebrovascular disease, unspecified   . Closed right acetabular fracture ([place])   . COPD (chronic obstructive pulmonary disease) ([place]) 1999  . Epidural hematoma ([place]) 03/01/2011   Recent fall 2012   . Esophageal reflux   . Generalized osteoarthrosis, unspecified site   . History of tobacco abuse   . Hypertension   . HYPERTENSION, BENIGN 10/20/2008   Qualifier: Diagnosis of  By: Owens Shark, RN, BSN, Lauren    . Hyponatremia   . Insomnia   . Iron deficiency anemia, unspecified   .  ISCHEMIC COLITIS, HX OF  10/20/2008   Qualifier: Diagnosis of  By: Owens Shark, RN, BSN, Lauren    . Lung nodules 2008, 2014   LLL nodule removed 2008 with LLL lobectomy,  RLL nodule 66m observation  . Prostate cancer ([place] 2015  . Protruding sternal wires   . S/P mitral valve replacement 02/01/1998   Carpentier-Edwards porcine bioprosthetic tissue valve, size 371mPlaced for acute and subacute bacterial endocarditis (group G Streptococcus) with pre-existing mitral valve prolapse   . S/P mitral valve replacement with bioprosthetic valve 03/24/2013   Redo mitral valve replacement using 2983mdwards [facility] bovine bioprosthetic tissue valve performed via right mini thoracotomy  . Seizures ([facility]  last seizure 1 year ago  . Severe mitral regurgitation 02/20/2013  . Unsteady gait     Social History   Socioeconomic History  . Marital status: Divorced    Spouse name: Not on file  . Number of children: 2  . Years of education: 9  . Highest education level: Not on file  Occupational History  . Occupation: disabled  Tobacco Use  . Smoking status: Former Smoker    Packs/day: 2.00    Years: 45.00    Pack years: 90.00    Types: Cigarettes    Quit date: 07/30/2006    Years since quitting: 13.5  . Smokeless tobacco: Never Used  Vaping Use  . Vaping Use: Never used  Substance and Sexual Activity  . Alcohol use: No    Alcohol/week: 0.0 standard drinks    Comment: previous alcohol use & home-made alcohol consumption  . Drug use: No  . Sexual activity: Not Currently  Other Topics Concern  . Not on file  Social History Narrative   Patient is divorced and has 2 children.   Patient is right handed.   Patient has 9 th grade education.    Patient drinks diet sodas.      [facility]:   From [place] originally. Always lived in [place] worked in conNurse, learning disabilityd roofing. Questionable asbestos exposure. Previously traveled to [place] pets currently. Remote pet owl and parakeet exposure. No mold exposure. Enjoys  fishing.    Social Determinants of Health   Financial Resource Strain:   . Difficulty of Paying Living Expenses:   Food Insecurity:   . Worried About RunCharity fundraiser the Last Year:   . RanArboriculturist the Last Year:   Transportation Needs:   . LacFilm/video editoredical):   . LMarland Kitchenck of Transportation (Non-Medical):   Physical Activity:   . Days of Exercise per Week:   . Minutes of Exercise per Session:   Stress:   . Feeling of Stress :   Social Connections:   . Frequency of Communication with Friends and Family:   . Frequency of Social Gatherings with Friends and Family:   . Attends Religious Services:   . Active Member of Clubs or Organizations:   . Attends CluArchivistetings:   . MMarland Kitchenrital Status:   Intimate Partner Violence:   . Fear of Current or Ex-Partner:   . Emotionally Abused:   . PMarland Kitchenysically Abused:   . Sexually Abused:     Past Surgical History:  Procedure Laterality Date  . CARDIAC VALVE REPLACEMENT    . INTRAOPERATIVE TRANSESOPHAGEAL ECHOCARDIOGRAM N/A 03/24/2013   Procedure: INTRAOPERATIVE TRANSESOPHAGEAL ECHOCARDIOGRAM;  Surgeon: ClaRexene AlbertsD;  [facility];  Laterality: N/A;  .  MINIMALLY INVASIVE MAZE PROCEDURE N/A 03/24/2013   Procedure: MINIMALLY INVASIVE MAZE PROCEDURE;  Surgeon: Rexene Alberts, MD;  Location: [facility];  Service: Open Heart Surgery;  Laterality: N/A;  . MITRAL VALVE REPLACEMENT  02/01/1998   Carpentier-Edwards porcine bioprosthetic tissue valve, size 30m, placed for complicated bacterial endocarditis  . MITRAL VALVE REPLACEMENT Right 03/24/2013   Procedure: MINIMALLY INVASIVE REDO MITRAL VALVE (MV) REPLACEMENT;  Surgeon: CRexene Alberts MD;  [facility]  Service: Open Heart Surgery;  Laterality: Right;  . PROSTATE BIOPSY    . radiation treatment tx for prostate cancer  2015  . STERNAL WIRES REMOVAL N/A 07/10/2018   Procedure: STERNAL WIRES REMOVAL;  Surgeon: ORexene Alberts MD;  [facility]  Service: Thoracic;  Laterality: N/A;  . TEE WITHOUT CARDIOVERSION N/A 02/12/2013   Procedure: TRANSESOPHAGEAL ECHOCARDIOGRAM (TEE);  Surgeon: PFay Records MD;  [facility];  Service: Cardiovascular;  Laterality: N/A;  . VIDEO ASSISTED THORACOSCOPY  04/29/2007   Left VATS w/ mini thoracotomy for Left Lower Lobectomy for benign lung nodules    Family History  Problem Relation Age of Onset  . Cancer Father        prostate cancer  . Tuberculosis Father   . Heart attack Father   . Cancer Brother        prostate cancer  . Arthritis Mother   . Lung disease Neg Hx     Allergies  Allergen Reactions  . Ace Inhibitors Swelling  . Lisinopril     Seizures   . Rosuvastatin Other (See Comments)    Muscle aches    Current Outpatient Medications on File Prior to Visit  Medication Sig Dispense Refill  . warfarin (COUMADIN) 6 MG tablet TAKE AS DIRECTED BY COUMADIN CLINIC 70 tablet 0  . ezetimibe (ZETIA) 10 MG tablet TAKE 1 TABLET EVERY DAY 90 tablet 3  . levETIRAcetam (KEPPRA) 500 MG tablet Take 1 tablet (500 mg total) by mouth 2 (two) times daily. 180 tablet 3  . metoprolol tartrate (LOPRESSOR) 25 MG tablet TAKE 1/2 TABLET TWICE DAILY 90 tablet 3  . phenytoin (DILANTIN) 100 MG ER capsule Take 3 capsules (300 mg total) by mouth at bedtime. 270 capsule 3  . tiotropium (SPIRIVA HANDIHALER) 18 MCG inhalation capsule INHALE THE CONTENTS OF 1 CAPSULE EVERY DAY VIA HANDIHALER ( NEED MD APPOINTMENT FOR REFILLS ) 90 capsule 3  . tiZANidine (ZANAFLEX) 4 MG tablet TAKE 1 TABLET EVERY 6 HOURS AS NEEDED FOR MUSCLE SPASMS 360 tablet 1  . traMADol (ULTRAM) 50 MG tablet Take 1 tablet (50 mg total) by mouth every 8 (eight) hours as needed. 90 tablet 2   No current facility-administered medications on file prior to visit.    There were no vitals taken for this visit.      Objective:   Physical Exam Vitals and nursing note reviewed.  Constitutional:      General: He  is not in acute distress.    Appearance: Normal appearance. He is well-developed and normal weight.  HENT:     Head: Normocephalic and atraumatic.     Right Ear: Tympanic membrane, ear canal and external ear normal. There is no impacted cerumen.     Left Ear: Tympanic membrane, ear canal and external ear normal. There is no impacted cerumen.     Nose: Nose normal. No congestion or rhinorrhea.     Mouth/Throat:     Mouth: Mucous membranes are moist.     Pharynx: Oropharynx is  clear. No oropharyngeal exudate or posterior oropharyngeal erythema.  Eyes:     General:        Right eye: No discharge.        Left eye: No discharge.     Extraocular Movements: Extraocular movements intact.     Conjunctiva/sclera: Conjunctivae normal.     Pupils: Pupils are equal, round, and reactive to light.  Neck:     Vascular: No carotid bruit.     Trachea: No tracheal deviation.  Cardiovascular:     Rate and Rhythm: Normal rate and regular rhythm.     Pulses: Normal pulses.     Heart sounds: Normal heart sounds. No murmur heard.  No friction rub. No gallop.   Pulmonary:     Effort: Pulmonary effort is normal. No respiratory distress.     Breath sounds: Normal breath sounds. No stridor. No wheezing, rhonchi or rales.  Chest:     Chest wall: No tenderness.  Abdominal:     General: Bowel sounds are normal. There is no distension.     Palpations: Abdomen is soft. There is no mass.     Tenderness: There is no abdominal tenderness. There is no right CVA tenderness, left CVA tenderness, guarding or rebound.     Hernia: No hernia is present.  Musculoskeletal:        General: No swelling, tenderness, deformity or signs of injury. Normal range of motion.     Right lower leg: No edema.     Left lower leg: No edema.  Lymphadenopathy:     Cervical: No cervical adenopathy.  Skin:    General: Skin is warm and dry.     Capillary Refill: Capillary refill takes less than 2 seconds.     Coloration: Skin is not  jaundiced or pale.     Findings: No bruising, erythema, lesion or rash.  Neurological:     General: No focal deficit present.     Mental Status: He is alert and oriented to person, place, and time.     Cranial Nerves: No cranial nerve deficit.     Sensory: No sensory deficit.     Motor: No weakness.     Coordination: Coordination normal.     Gait: Gait abnormal.     Deep Tendon Reflexes: Reflexes normal.     Comments: Walks with a cane   Psychiatric:        Mood and Affect: Mood normal.        Behavior: Behavior normal.        Thought Content: Thought content normal.        Judgment: Judgment normal.        Assessment & Plan:  1. Routine general medical examination at a health care facility - Follow up in one year or sooner if needed - CBC with Differential/Platelet; Future - Lipid panel; Future - TSH; Future - CMP with eGFR([facility]); Future  2. Atrial fibrillation, unspecified type ([place]) - Follow up with Cardiology as directed - CBC with Differential/Platelet; Future - Lipid panel; Future - TSH; Future - CMP with eGFR([facility]); Future  3. HYPERTENSION, BENIGN - Continue with Metorpolol. Did not take his medications this morning  - CBC with Differential/Platelet; Future - Lipid panel; Future - TSH; Future - CMP with eGFR([facility]); Future  4. Seizures ([place]) - Continue with current medications  - Follow up with Neurology as directed - CBC with Differential/Platelet; Future - Lipid panel; Future - TSH; Future - CMP with eGFR([facility]); Future  5. Mixed hyperlipidemia -  Continue with Zetia  - CBC with Differential/Platelet; Future - Lipid panel; Future - TSH; Future - CMP with eGFR([facility]); Future  6. Malignant neoplasm of prostate ([facility])  - PSA; Future  7. Other emphysema ([place]) - Continue with Spiriva  - Follow up with pulmonary as direcred  8. S/P mitral valve replacement - Follow up with Cardiology  - Continue with Coumadin  - CBC with Differential/Platelet;  Future - Lipid panel; Future - TSH; Future - CMP with eGFR([facility]); Future  9. Chronic bilateral low back pain without sciatica - Continue with Tramadol and zanaflex   Dorothyann Peng, NP

## 2020-02-26 NOTE — Patient Instructions (Signed)
It was great seeing you today   We do not need to change any of your medications   You have received your last pneumonia vaccination today   We will follow up with you regarding your lab work   I will see you back in one year or sooner if needed

## 2020-02-26 NOTE — Addendum Note (Signed)
Addended by: Miles Costain T on: 02/26/2020 08:26 AM   Modules accepted: Orders

## 2020-02-27 LAB — COMPLETE METABOLIC PANEL WITH GFR
AG Ratio: 1.9 (calc) (ref 1.0–2.5)
ALT: 18 U/L (ref 9–46)
AST: 19 U/L (ref 10–35)
Albumin: 4.1 g/dL (ref 3.6–5.1)
Alkaline phosphatase (APISO): 93 U/L (ref 35–144)
BUN: 8 mg/dL (ref 7–25)
CO2: 28 mmol/L (ref 20–32)
Calcium: 9 mg/dL (ref 8.6–10.3)
Chloride: 103 mmol/L (ref 98–110)
Creat: 1.16 mg/dL (ref 0.70–1.18)
GFR, Est African American: 71 mL/min/{1.73_m2} (ref >=60)
GFR, Est Non African American: 62 mL/min/{1.73_m2} (ref >=60)
Globulin: 2.2 g/dL (calc) (ref 1.9–3.7)
Glucose, Bld: 87 mg/dL (ref 65–99)
Potassium: 4.2 mmol/L (ref 3.5–5.3)
Sodium: 139 mmol/L (ref 135–146)
Total Bilirubin: 0.5 mg/dL (ref 0.2–1.2)
Total Protein: 6.3 g/dL (ref 6.1–8.1)

## 2020-02-27 LAB — CBC WITH DIFFERENTIAL/PLATELET
Absolute Monocytes: 933 cells/uL (ref 200–950)
Basophils Absolute: 32 cells/uL (ref 0–200)
Basophils Relative: 0.6 %
Eosinophils Absolute: 170 cells/uL (ref 15–500)
Eosinophils Relative: 3.2 %
HCT: 37 % — ABNORMAL LOW (ref 38.5–50.0)
Hemoglobin: 12.9 g/dL — ABNORMAL LOW (ref 13.2–17.1)
Lymphs Abs: 986 cells/uL (ref 850–3900)
MCH: 33.7 pg — ABNORMAL HIGH (ref 27.0–33.0)
MCHC: 34.9 g/dL (ref 32.0–36.0)
MCV: 96.6 fL (ref 80.0–100.0)
MPV: 10.9 fL (ref 7.5–12.5)
Monocytes Relative: 17.6 %
Neutro Abs: 3180 cells/uL (ref 1500–7800)
Neutrophils Relative %: 60 %
Platelets: 189 10*3/uL (ref 140–400)
RBC: 3.83 10*6/uL — ABNORMAL LOW (ref 4.20–5.80)
RDW: 12.1 % (ref 11.0–15.0)
Total Lymphocyte: 18.6 %
WBC: 5.3 10*3/uL (ref 3.8–10.8)

## 2020-02-27 LAB — LIPID PANEL
Cholesterol: 135 mg/dL (ref <200)
HDL: 65 mg/dL (ref >=40)
LDL Cholesterol (Calc): 56 mg/dL (calc)
Non-HDL Cholesterol (Calc): 70 mg/dL (calc) (ref <130)
Total CHOL/HDL Ratio: 2.1 (calc) (ref <5.0)
Triglycerides: 48 mg/dL (ref <150)

## 2020-02-27 LAB — PSA: PSA: <0.1 ng/mL (ref <=4.0)

## 2020-02-27 LAB — TSH: TSH: 1.5 mIU/L (ref 0.40–4.50)

## 2020-03-01 ENCOUNTER — Encounter: Payer: Self-pay | Admitting: Family Medicine

## 2020-03-08 ENCOUNTER — Ambulatory Visit (HOSPITAL_COMMUNITY)
Admission: RE | Admit: 2020-03-08 | Discharge: 2020-03-08 | Disposition: A | Discharge status: Home / Self Care | Payer: Medicare HMO | Source: Ambulatory Visit | Attending: Cardiovascular Disease | Admitting: Cardiovascular Disease

## 2020-03-08 ENCOUNTER — Other Ambulatory Visit: Payer: Self-pay

## 2020-03-08 ENCOUNTER — Other Ambulatory Visit (HOSPITAL_COMMUNITY): Payer: Self-pay | Admitting: Cardiology

## 2020-03-08 ENCOUNTER — Encounter: Payer: Self-pay | Admitting: Family Medicine

## 2020-03-08 DIAGNOSIS — I6523 Occlusion and stenosis of bilateral carotid arteries: Secondary | ICD-10-CM

## 2020-03-10 ENCOUNTER — Other Ambulatory Visit: Payer: Self-pay | Admitting: Primary Care

## 2020-03-10 ENCOUNTER — Telehealth: Payer: Self-pay | Admitting: Adult Health

## 2020-03-10 NOTE — Telephone Encounter (Signed)
Pt is calling in stating that he has not received Rx's ezetimibe (ZETIA) 10 MG and tramadol (ULTRAM) 50 MG and that the pharmacy stated that he needs to call us.  Pt wants to make sure that we give his address when we call the medication in (verified the address on file with pt it is correct).  Pharm:  [employer] [address] [address].

## 2020-03-10 NOTE — Telephone Encounter (Signed)
Tried to return call.  No answer or machine.  I spoke to [place] at [facility].  Zetia was delivered with his warfarin on 02/26/20.  Tramadol was delivered on July 6th.  Both shipments state they were delivered "in or at mailbox."  If the pt does not have his medication then he needs to contact [facility] for a re shipment.

## 2020-03-16 NOTE — Telephone Encounter (Signed)
Left a message for a return call.

## 2020-03-17 ENCOUNTER — Telehealth: Payer: Self-pay | Admitting: Adult Health

## 2020-03-17 NOTE — Telephone Encounter (Signed)
Pt returned call. Call pt when possible

## 2020-03-18 ENCOUNTER — Other Ambulatory Visit: Payer: Self-pay | Admitting: Adult Health

## 2020-03-18 MED ORDER — TRAMADOL HCL 50 MG PO TABS
50.0000 mg | ORAL_TABLET | Freq: Three times a day (TID) | ORAL | 2 refills | Status: DC | PRN
Start: 2020-03-18 — End: 2020-08-09

## 2020-03-18 MED ORDER — SPIRIVA HANDIHALER 18 MCG IN CAPS: ORAL_CAPSULE | RESPIRATORY_TRACT | 3 refills | Status: DC

## 2020-03-18 NOTE — Telephone Encounter (Signed)
Pt called to say that he has not received his inhaler or the tramedol. [facility] sent a request and it has not been sent back  [phone] human fax [phone] is the number.  Please advise

## 2020-03-24 ENCOUNTER — Ambulatory Visit (INDEPENDENT_AMBULATORY_CARE_PROVIDER_SITE_OTHER): Payer: Medicare HMO | Admitting: Pharmacist

## 2020-03-24 ENCOUNTER — Other Ambulatory Visit: Payer: Self-pay

## 2020-03-24 DIAGNOSIS — Z5181 Encounter for therapeutic drug level monitoring: Secondary | ICD-10-CM

## 2020-03-24 DIAGNOSIS — I4891 Unspecified atrial fibrillation: Secondary | ICD-10-CM | POA: Diagnosis not present

## 2020-03-24 LAB — POCT INR: INR: 4.6 — AB (ref 2.0–3.0)

## 2020-03-24 NOTE — Patient Instructions (Signed)
Description   Skip your Coumadin today, then continue taking 1/2 tablet daily except 1 tablet on Tuesdays and Saturdays.  Recheck INR in 3 weeks. [facility]in [facility] [phone]

## 2020-04-14 ENCOUNTER — Other Ambulatory Visit: Payer: Self-pay

## 2020-04-14 ENCOUNTER — Ambulatory Visit (INDEPENDENT_AMBULATORY_CARE_PROVIDER_SITE_OTHER): Payer: Medicare HMO | Admitting: *Deleted

## 2020-04-14 DIAGNOSIS — I4891 Unspecified atrial fibrillation: Secondary | ICD-10-CM

## 2020-04-14 DIAGNOSIS — Z5181 Encounter for therapeutic drug level monitoring: Secondary | ICD-10-CM | POA: Diagnosis not present

## 2020-04-14 LAB — POCT INR: INR: 4 — AB (ref 2.0–3.0)

## 2020-04-14 NOTE — Patient Instructions (Signed)
Description   Skip your Coumadin today, then start taking 1/2 tablet daily except 1 tablet on Tuesdays.  Recheck INR in 3 weeks. Coumadin Clinic [phone]

## 2020-04-18 ENCOUNTER — Other Ambulatory Visit: Payer: Self-pay | Admitting: Cardiology

## 2020-05-05 ENCOUNTER — Ambulatory Visit (INDEPENDENT_AMBULATORY_CARE_PROVIDER_SITE_OTHER): Payer: Medicare HMO | Admitting: *Deleted

## 2020-05-05 ENCOUNTER — Other Ambulatory Visit: Payer: Self-pay

## 2020-05-05 DIAGNOSIS — Z5181 Encounter for therapeutic drug level monitoring: Secondary | ICD-10-CM | POA: Diagnosis not present

## 2020-05-05 DIAGNOSIS — I4891 Unspecified atrial fibrillation: Secondary | ICD-10-CM | POA: Diagnosis not present

## 2020-05-05 LAB — POCT INR: INR: 3.4 — AB (ref 2.0–3.0)

## 2020-05-05 NOTE — Patient Instructions (Signed)
Description   Continue taking Warfarin 1/2 tablet daily except 1 tablet on Tuesdays.  Recheck INR in 4 weeks. Coumadin Clinic [phone]

## 2020-05-23 ENCOUNTER — Other Ambulatory Visit: Payer: Self-pay | Admitting: Adult Health

## 2020-05-23 DIAGNOSIS — Z760 Encounter for issue of repeat prescription: Secondary | ICD-10-CM

## 2020-06-02 ENCOUNTER — Other Ambulatory Visit: Payer: Self-pay

## 2020-06-02 ENCOUNTER — Ambulatory Visit (INDEPENDENT_AMBULATORY_CARE_PROVIDER_SITE_OTHER): Payer: Medicare HMO | Admitting: *Deleted

## 2020-06-02 DIAGNOSIS — I4891 Unspecified atrial fibrillation: Secondary | ICD-10-CM

## 2020-06-02 DIAGNOSIS — Z5181 Encounter for therapeutic drug level monitoring: Secondary | ICD-10-CM | POA: Diagnosis not present

## 2020-06-02 LAB — POCT INR: INR: 1.6 — AB (ref 2.0–3.0)

## 2020-06-02 NOTE — Patient Instructions (Signed)
Description   Take 1 tablet of warfarin today and tomorrow, then continue taking Warfarin 1/2 tablet daily except 1 tablet on Tuesdays.  Recheck INR in 1.5 weeks. Coumadin Clinic [phone]

## 2020-06-13 ENCOUNTER — Other Ambulatory Visit: Payer: Self-pay

## 2020-06-13 ENCOUNTER — Ambulatory Visit (INDEPENDENT_AMBULATORY_CARE_PROVIDER_SITE_OTHER): Payer: Medicare HMO | Admitting: *Deleted

## 2020-06-13 DIAGNOSIS — Z5181 Encounter for therapeutic drug level monitoring: Secondary | ICD-10-CM

## 2020-06-13 DIAGNOSIS — I4891 Unspecified atrial fibrillation: Secondary | ICD-10-CM | POA: Diagnosis not present

## 2020-06-13 LAB — POCT INR: INR: 2.3 (ref 2.0–3.0)

## 2020-06-13 NOTE — Patient Instructions (Signed)
Description   Today take 1 tablet then start taking Warfarin 1/2 tablet daily except 1 tablet on Tuesdays and Saturdays. Recheck INR in 3 weeks. Coumadin Clinic [phone]

## 2020-06-20 ENCOUNTER — Ambulatory Visit (INDEPENDENT_AMBULATORY_CARE_PROVIDER_SITE_OTHER): Payer: Medicare HMO

## 2020-06-20 ENCOUNTER — Other Ambulatory Visit: Payer: Self-pay

## 2020-06-20 VITALS — BP 160/80 | HR 56 | Temp 98.0°F | Ht 63.0 in | Wt 137.4 lb

## 2020-06-20 DIAGNOSIS — Z23 Encounter for immunization: Secondary | ICD-10-CM | POA: Diagnosis not present

## 2020-06-20 DIAGNOSIS — Z0000 Encounter for general adult medical examination without abnormal findings: Secondary | ICD-10-CM | POA: Diagnosis not present

## 2020-06-20 MED ORDER — METOPROLOL TARTRATE 25 MG PO TABS
12.5000 mg | ORAL_TABLET | Freq: Two times a day (BID) | ORAL | 3 refills | Status: DC
Start: 2020-06-20 — End: 2021-05-16

## 2020-06-20 NOTE — Progress Notes (Signed)
Subjective:   Jeffery Copeland is a 74 y.o. male who presents for an Initial Medicare Annual Wellness Visit.  Review of Systems    N/A  Cardiac Risk Factors include: advanced age (>56men, >44 women);male gender;hypertension;smoking/ tobacco exposure     Objective:    Today's Vitals   06/20/20 1323 06/20/20 1324  BP: (!) 160/80   Pulse: (!) 56   Temp: 98 F (36.7 C)   TempSrc: Oral   SpO2: 96%   Weight: 137 lb 7 oz (62.3 kg)   Height: 5\' 3"  (1.6 m)   PainSc:  9    Body mass index is 24.35 kg/m.  Advanced Directives 06/20/2020 04/09/2017 02/03/2016 01/04/2016 12/28/2015 11/03/2015 10/31/2015  Does Patient Have a Medical Advance Directive? Yes No No Yes No Yes Yes  Type of Paramedic of [place];Living will - - Out of facility DNR (pink MOST or yellow form) - Out of facility DNR (pink MOST or yellow form) Out of facility DNR (pink MOST or yellow form)  Does patient want to make changes to medical advance directive? No - Patient declined - - No - Patient declined - No - Patient declined No - Patient declined  Copy of [place] in Chart? Yes - validated most recent copy scanned in chart (See row information) - - Yes - Yes Yes  Would patient like information on creating a medical advance directive? - No - Patient declined - - No - patient declined information - -  Pre-existing out of facility DNR order (yellow form or pink MOST form) - - - - - - -    Current Medications (verified) Outpatient Encounter Medications as of 06/20/2020  Medication Sig  . ezetimibe (ZETIA) 10 MG tablet TAKE 1 TABLET EVERY DAY  . levETIRAcetam (KEPPRA) 500 MG tablet Take 1 tablet (500 mg total) by mouth 2 (two) times daily.  . metoprolol tartrate (LOPRESSOR) 25 MG tablet Take 0.5 tablets (12.5 mg total) by mouth 2 (two) times daily.  . phenytoin (DILANTIN) 100 MG ER capsule Take 3 capsules (300 mg total) by mouth at bedtime.  Marland Kitchen tiotropium (SPIRIVA HANDIHALER) 18 MCG  inhalation capsule INHALE THE CONTENTS OF 1 CAPSULE EVERY DAY VIA HANDIHALER ( NEED MD APPOINTMENT FOR REFILLS )  . tiZANidine (ZANAFLEX) 4 MG tablet TAKE 1 TABLET EVERY 6 HOURS AS NEEDED FOR MUSCLE SPASMS  . traMADol (ULTRAM) 50 MG tablet Take 1 tablet (50 mg total) by mouth every 8 (eight) hours as needed.  . warfarin (COUMADIN) 6 MG tablet TAKE AS DIRECTED BY COUMADIN CLINIC  . [DISCONTINUED] metoprolol tartrate (LOPRESSOR) 25 MG tablet TAKE 1/2 TABLET TWICE DAILY   No facility-administered encounter medications on file as of 06/20/2020.    Allergies (verified) Ace inhibitors, Lisinopril, and Rosuvastatin   History: Past Medical History:  Diagnosis Date  . Acute and subacute bacterial endocarditis 1999   group G Streptococcus  . Atrial fibrillation ([place]) 11/11/2008   Qualifier: Diagnosis of  By: Lia Foyer, MD, Jaquelyn Bitter   . CARDIOMYOPATHY 10/20/2008   Qualifier: Diagnosis of  By: Owens Shark, RN, BSN, Lauren    . Cerebrovascular disease, unspecified   . Closed right acetabular fracture ([place])   . COPD (chronic obstructive pulmonary disease) ([place]) 1999  . Epidural hematoma ([place]) 03/01/2011   Recent fall 2012   . Esophageal reflux   . Generalized osteoarthrosis, unspecified site   . History of tobacco abuse   . Hypertension   . HYPERTENSION, BENIGN 10/20/2008  Qualifier: Diagnosis of  By: Owens Shark, RN, BSN, Lauren    . Hyponatremia   . Insomnia   . Iron deficiency anemia, unspecified   . ISCHEMIC COLITIS, HX OF 10/20/2008   Qualifier: Diagnosis of  By: Owens Shark, RN, BSN, Lauren    . Lung nodules 2008, 2014   LLL nodule removed 2008 with LLL lobectomy,  RLL nodule 67mm observation  . Prostate cancer ([place]) 2015  . Protruding sternal wires   . S/P mitral valve replacement 02/01/1998   Carpentier-Edwards porcine bioprosthetic tissue valve, size 9mm Placed for acute and subacute bacterial endocarditis (group G Streptococcus) with pre-existing mitral valve prolapse   . S/P mitral valve  replacement with bioprosthetic valve 03/24/2013   Redo mitral valve replacement using 54mm [facility] mitral bovine bioprosthetic tissue valve performed via right mini thoracotomy  . Seizures ([place])    last seizure 1 year ago  . Severe mitral regurgitation 02/20/2013  . Unsteady gait    Past Surgical History:  Procedure Laterality Date  . CARDIAC VALVE REPLACEMENT    . INTRAOPERATIVE TRANSESOPHAGEAL ECHOCARDIOGRAM N/A 03/24/2013   Procedure: INTRAOPERATIVE TRANSESOPHAGEAL ECHOCARDIOGRAM;  Surgeon: Rexene Alberts, MD;  Location: [place];  Service: Open Heart Surgery;  Laterality: N/A;  . MINIMALLY INVASIVE MAZE PROCEDURE N/A 03/24/2013   Procedure: MINIMALLY INVASIVE MAZE PROCEDURE;  Surgeon: Rexene Alberts, MD;  Location: [place];  Service: Open Heart Surgery;  Laterality: N/A;  . MITRAL VALVE REPLACEMENT  02/01/1998   Carpentier-Edwards porcine bioprosthetic tissue valve, size 31mm, placed for complicated bacterial endocarditis  . MITRAL VALVE REPLACEMENT Right 03/24/2013   Procedure: MINIMALLY INVASIVE REDO MITRAL VALVE (MV) REPLACEMENT;  Surgeon: Rexene Alberts, MD;  Location: [place];  Service: Open Heart Surgery;  Laterality: Right;  . PROSTATE BIOPSY    . radiation treatment tx for prostate cancer  2015  . STERNAL WIRES REMOVAL N/A 07/10/2018   Procedure: STERNAL WIRES REMOVAL;  Surgeon: Rexene Alberts, MD;  Location: [place];  Service: Thoracic;  Laterality: N/A;  . TEE WITHOUT CARDIOVERSION N/A 02/12/2013   Procedure: TRANSESOPHAGEAL ECHOCARDIOGRAM (TEE);  Surgeon: Fay Records, MD;  [facility] ENDOSCOPY;  Service: Cardiovascular;  Laterality: N/A;  . VIDEO ASSISTED THORACOSCOPY  04/29/2007   Left VATS w/ mini thoracotomy for Left Lower Lobectomy for benign lung nodules   Family History  Problem Relation Age of Onset  . Cancer Father        prostate cancer  . Tuberculosis Father   . Heart attack Father   . Cancer Brother        prostate cancer  . Arthritis Mother   . Lung  disease Neg Hx    Social History   Socioeconomic History  . Marital status: Divorced    Spouse name: Not on file  . Number of children: 2  . Years of education: 9  . Highest education level: Not on file  Occupational History  . Occupation: disabled  Tobacco Use  . Smoking status: Former Smoker    Packs/day: 2.00    Years: 45.00    Pack years: 90.00    Types: Cigarettes    Quit date: 07/30/2006    Years since quitting: 13.9  . Smokeless tobacco: Never Used  Vaping Use  . Vaping Use: Never used  Substance and Sexual Activity  . Alcohol use: No    Alcohol/week: 0.0 standard drinks    Comment: previous alcohol use & home-made alcohol consumption  . Drug use: No  . Sexual activity: Not  Currently  Other Topics Concern  . Not on file  Social History Narrative   Patient is divorced and has 2 children.   Patient is right handed.   Patient has 9 th grade education.    Patient drinks diet sodas.      [facility]:   From [place] originally. Always lived in [place]. 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 Previously traveled to [place], [place], & [place]. No pets currently. Remote pet owl and parakeet exposure. No mold exposure. Enjoys fishing.    Social Determinants of Health   Financial Resource Strain: Low Risk   . Difficulty of Paying Living Expenses: Not hard at all  Food Insecurity: No Food Insecurity  . Worried About Charity fundraiser in the Last Year: Never true  . Ran Out of Food in the Last Year: Never true  Transportation Needs: No Transportation Needs  . Lack of Transportation (Medical): No  . Lack of Transportation (Non-Medical): No  Physical Activity: Insufficiently Active  . Days of Exercise per Week: 7 days  . Minutes of Exercise per Session: 20 min  Stress: No Stress Concern Present  . Feeling of Stress : Not at all  Social Connections: Socially Isolated  . Frequency of Communication with Friends and Family: More than three times a week  .  Frequency of Social Gatherings with Friends and Family: More than three times a week  . Attends Religious Services: Never  . Active Member of Clubs or Organizations: No  . Attends Archivist Meetings: Never  . Marital Status: Divorced    Tobacco Counseling Counseling given: Not Answered   Clinical Intake:  Pre-visit preparation completed: Yes  Pain : 0-10 Pain Score: 9  Pain Type: Chronic pain Pain Location: Back (Hand, neck) Pain Descriptors / Indicators: Aching Pain Onset: More than a month ago Pain Frequency: Constant Pain Relieving Factors: Tramadol  Pain Relieving Factors: Tramadol  Nutritional Risks: None Diabetes: No  How often do you need to have someone help you when you read instructions, pamphlets, or other written materials from your doctor or pharmacy?: 1 - Never What is the last grade level you completed in school?: 9th grade  Diabetic?No   Interpreter Needed?: No  Information entered by :: [place] of Daily Living In your present state of health, do you have any difficulty performing the following activities: 06/20/2020  Hearing? Y  Vision? N  Difficulty concentrating or making decisions? N  Walking or climbing stairs? N  Dressing or bathing? N  Doing errands, shopping? Y  Preparing Food and eating ? N  Using the Toilet? N  In the past six months, have you accidently leaked urine? N  Do you have problems with loss of bowel control? N  Managing your Medications? N  Managing your Finances? N  Housekeeping or managing your Housekeeping? N  Some recent data might be hidden    Patient Care Team: Dorothyann Peng, NP as PCP - General (Family Medicine) Elsie Stain, MD (Pulmonary Disease) Elsie Stain, MD (Pulmonary Disease) Willey Blade, MD as Attending Physician (Internal Medicine) Willey Blade, MD as Attending Physician (Internal Medicine) Glendale Chard, MD (Internal Medicine) Ardis Hughs,  MD as Attending Physician (Urology)  Indicate any recent Medical Services you may have received from other than [facility] providers in the past year (date may be approximate).     Assessment:   This is a routine wellness examination for [place].  Hearing/Vision screen  Hearing  Screening   125Hz  250Hz  500Hz  1000Hz  2000Hz  3000Hz  4000Hz  6000Hz  8000Hz   Right ear:           Left ear:           Vision Screening Comments: Patient states gets eyes checked every year    Dietary issues and exercise activities discussed: Current Exercise Habits: Home exercise routine, Type of exercise: walking, Time (Minutes): 20, Frequency (Times/Week): 7, Weekly Exercise (Minutes/Week): 140, Intensity: Mild  Goals    . Patient Stated     I will continue to walk at least 0.25 miles per day       Depression Screen PHQ 2/9 Scores 06/20/2020 03/25/2018 03/15/2016 03/02/2016 06/09/2015 06/03/2015 05/27/2015  PHQ - 2 Score 0 0 0 1 0 0 0  PHQ- 9 Score 0 - - - - - -    Fall Risk Fall Risk  06/20/2020 03/25/2018 10/22/2017 04/24/2017 10/22/2016  Falls in the past year? 0 No No No No  Number falls in past yr: 0 - - - -  Injury with Fall? 0 - - - -  Risk for fall due to : Impaired balance/gait - - - -  Follow up Falls evaluation completed;Falls prevention discussed - - - -  Comment - - - - -    Any stairs in or around the home? No  If so, are there any without handrails? No  Home free of loose throw rugs in walkways, pet beds, electrical cords, etc? Yes  Adequate lighting in your home to reduce risk of falls? Yes   ASSISTIVE DEVICES UTILIZED TO PREVENT FALLS:  Life alert? No  Use of a cane, walker or w/c? Yes  Grab bars in the bathroom? Yes  Shower chair or bench in shower? Yes  Elevated toilet seat or a handicapped toilet? Yes   TIMED UP AND GO:  Was the test performed? Yes .  Length of time to ambulate 10 feet: 9 sec.   Gait slow and steady with assistive device  Cognitive Function:    Cognitive  screening not indicated based on direct observation     Immunizations Immunization History  Administered Date(s) Administered  . Fluad Quad(high Dose 65+) 07/09/2019, 06/20/2020  . Influenza Split 03/30/2012, 06/15/2015  . Influenza Whole 07/02/2011  . Influenza, High Dose Seasonal PF 04/29/2016, 09/06/2017, 08/05/2018  . Influenza-Unspecified 03/30/2014, 06/30/2015  . PPD Test 10/26/2015, 01/03/2016  . Pneumococcal Conjugate-13 03/25/2018  . Pneumococcal Polysaccharide-23 02/26/2020  . Td 12/27/2015  . Tdap 12/22/2012    TDAP status: Up to date Flu Vaccine status: Completed at today's visit Pneumococcal vaccine status: Up to date Covid-19 vaccine status: Completed vaccines  Qualifies for Shingles Vaccine? Yes   Zostavax completed No   Shingrix Completed?: No.    Education has been provided regarding the importance of this vaccine. Patient has been advised to call insurance company to determine out of pocket expense if they have not yet received this vaccine. Advised may also receive vaccine at local pharmacy or Health Dept. Verbalized acceptance and understanding.  Screening Tests Health Maintenance  Topic Date Due  . COVID-19 Vaccine (1) Never done  . COLONOSCOPY  02/25/2021 (Originally 04/13/2017)  . TETANUS/TDAP  12/26/2025  . INFLUENZA VACCINE  Completed  . Hepatitis C Screening  Completed  . PNA vac Low Risk Adult  Completed    Health Maintenance  Health Maintenance Due  Topic Date Due  . COVID-19 Vaccine (1) Never done    Colorectal cancer screening: Completed 04/14/2007. Repeat every 10 years  Lung Cancer Screening: (Low Dose CT Chest recommended if Age 66-80 years, 30 pack-year currently smoking OR have quit w/in 15years.) does not qualify.   Lung Cancer Screening Referral: N/A  Additional Screening:  Hepatitis C Screening: does qualify; Completed 03/15/2017   Vision Screening: Recommended annual ophthalmology exams for early detection of glaucoma and  other disorders of the eye. Is the patient up to date with their annual eye exam?  Yes  Who is the provider or what is the name of the office in which the patient attends annual eye exams? Dr. Katy Fitch  If pt is not established with a provider, would they like to be referred to a provider to establish care? No .   Dental Screening: Recommended annual dental exams for proper oral hygiene  Community Resource Referral / Chronic Care Management: CRR required this visit?  No   CCM required this visit?  No      Plan:     I have personally reviewed and noted the following in the patient's chart:   . Medical and social history . Use of alcohol, tobacco or illicit drugs  . Current medications and supplements . Functional ability and status . Nutritional status . Physical activity . Advanced directives . List of other physicians . Hospitalizations, surgeries, and ER visits in previous 12 months . Vitals . Screenings to include cognitive, depression, and falls . Referrals and appointments  In addition, I have reviewed and discussed with patient certain preventive protocols, quality metrics, and best practice recommendations. A written personalized care plan for preventive services as well as general preventive health recommendations were provided to patient.     Ofilia Neas, LPN   10/93/2355   Nurse Notes: None

## 2020-06-20 NOTE — Patient Instructions (Signed)
Jeffery Copeland , Thank you for taking time to come for your Medicare Wellness Visit. I appreciate your ongoing commitment to your health goals. Please review the following plan we discussed and let me know if I can assist you in the future.   Screening recommendations/referrals: Colonoscopy: Currently due please let us know when you are ready to get scheduled  Recommended yearly ophthalmology/optometry visit for glaucoma screening and checkup Recommended yearly dental visit for hygiene and checkup  Vaccinations: Influenza vaccine: Up to date next due fall 2022  Pneumococcal vaccine: Completed series  Tdap vaccine: up to date, next due 11/29/2025 Shingles vaccine: Currently due for Shingrix, if you wish to receive we recommend that you do so at your local pharmacy as it will be less expensive     Advanced directives: Copies on file   Conditions/risks identified: None   Next appointment: 06/21/2021 @ 1:15 PM with [place] 65 Years and Older, Male Preventive care refers to lifestyle choices and visits with your health care provider that can promote health and wellness. What does preventive care include?  A yearly physical exam. This is also called an annual well check.  Dental exams once or twice a year.  Routine eye exams. Ask your health care provider how often you should have your eyes checked.  Personal lifestyle choices, including:  Daily care of your teeth and gums.  Regular physical activity.  Eating a healthy diet.  Avoiding tobacco and drug use.  Limiting alcohol use.  Practicing safe sex.  Taking low doses of aspirin every day.  Taking vitamin and mineral supplements as recommended by your health care provider. What happens during an annual well check? The services and screenings done by your health care provider during your annual well check will depend on your age, overall health, lifestyle risk factors, and family history of  disease. Counseling  Your health care provider may ask you questions about your:  Alcohol use.  Tobacco use.  Drug use.  Emotional well-being.  Home and relationship well-being.  Sexual activity.  Eating habits.  History of falls.  Memory and ability to understand (cognition).  Work and work Statistician. Screening  You may have the following tests or measurements:  Height, weight, and BMI.  Blood pressure.  Lipid and cholesterol levels. These may be checked every 5 years, or more frequently if you are over 31 years old.  Skin check.  Lung cancer screening. You may have this screening every year starting at age 18 if you have a 30-pack-year history of smoking and currently smoke or have quit within the past 15 years.  Fecal occult blood test (FOBT) of the stool. You may have this test every year starting at age 27.  Flexible sigmoidoscopy or colonoscopy. You may have a sigmoidoscopy every 5 years or a colonoscopy every 10 years starting at age 68.  Prostate cancer screening. Recommendations will vary depending on your family history and other risks.  Hepatitis C blood test.  Hepatitis B blood test.  Sexually transmitted disease (STD) testing.  Diabetes screening. This is done by checking your blood sugar (glucose) after you have not eaten for a while (fasting). You may have this done every 1-3 years.  Abdominal aortic aneurysm (AAA) screening. You may need this if you are a current or former smoker.  Osteoporosis. You may be screened starting at age 15 if you are at high risk. Talk with your health care provider about your test results, treatment  options, and if necessary, the need for more tests. Vaccines  Your health care provider may recommend certain vaccines, such as:  Influenza vaccine. This is recommended every year.  Tetanus, diphtheria, and acellular pertussis (Tdap, Td) vaccine. You may need a Td booster every 10 years.  Zoster vaccine. You may  need this after age 42.  Pneumococcal 13-valent conjugate (PCV13) vaccine. One dose is recommended after age 60.  Pneumococcal polysaccharide (PPSV23) vaccine. One dose is recommended after age 81. Talk to your health care provider about which screenings and vaccines you need and how often you need them. This information is not intended to replace advice given to you by your health care provider. Make sure you discuss any questions you have with your health care provider. Document Released: 08/12/2015 Document Revised: 04/04/2016 Document Reviewed: 05/17/2015 Elsevier Interactive Patient Education  2017 [place] Prevention in the Home Falls can cause injuries. They can happen to people of all ages. There are many things you can do to make your home safe and to help prevent falls. What can I do on the outside of my home?  Regularly fix the edges of walkways and driveways and fix any cracks.  Remove anything that might make you trip as you walk through a door, such as a raised step or threshold.  Trim any bushes or trees on the path to your home.  Use bright outdoor lighting.  Clear any walking paths of anything that might make someone trip, such as rocks or tools.  Regularly check to see if handrails are loose or broken. Make sure that both sides of any steps have handrails.  Any raised decks and porches should have guardrails on the edges.  Have any leaves, snow, or ice cleared regularly.  Use sand or salt on walking paths during winter.  Clean up any spills in your garage right away. This includes oil or grease spills. What can I do in the bathroom?  Use night lights.  Install grab bars by the toilet and in the tub and shower. Do not use towel bars as grab bars.  Use non-skid mats or decals in the tub or shower.  If you need to sit down in the shower, use a plastic, non-slip stool.  Keep the floor dry. Clean up any water that spills on the floor as soon as it  happens.  Remove soap buildup in the tub or shower regularly.  Attach bath mats securely with double-sided non-slip rug tape.  Do not have throw rugs and other things on the floor that can make you trip. What can I do in the bedroom?  Use night lights.  Make sure that you have a light by your bed that is easy to reach.  Do not use any sheets or blankets that are too big for your bed. They should not hang down onto the floor.  Have a firm chair that has side arms. You can use this for support while you get dressed.  Do not have throw rugs and other things on the floor that can make you trip. What can I do in the kitchen?  Clean up any spills right away.  Avoid walking on wet floors.  Keep items that you use a lot in easy-to-reach places.  If you need to reach something above you, use a strong step stool that has a grab bar.  Keep electrical cords out of the way.  Do not use floor polish or wax that makes floors slippery.  If you must use wax, use non-skid floor wax.  Do not have throw rugs and other things on the floor that can make you trip. What can I do with my stairs?  Do not leave any items on the stairs.  Make sure that there are handrails on both sides of the stairs and use them. Fix handrails that are broken or loose. Make sure that handrails are as long as the stairways.  Check any carpeting to make sure that it is firmly attached to the stairs. Fix any carpet that is loose or worn.  Avoid having throw rugs at the top or bottom of the stairs. If you do have throw rugs, attach them to the floor with carpet tape.  Make sure that you have a light switch at the top of the stairs and the bottom of the stairs. If you do not have them, ask someone to add them for you. What else can I do to help prevent falls?  Wear shoes that:  Do not have high heels.  Have rubber bottoms.  Are comfortable and fit you well.  Are closed at the toe. Do not wear sandals.  If you  use a stepladder:  Make sure that it is fully opened. Do not climb a closed stepladder.  Make sure that both sides of the stepladder are locked into place.  Ask someone to hold it for you, if possible.  Clearly mark and make sure that you can see:  Any grab bars or handrails.  First and last steps.  Where the edge of each step is.  Use tools that help you move around (mobility aids) if they are needed. These include:  Canes.  Walkers.  Scooters.  Crutches.  Turn on the lights when you go into a dark area. Replace any light bulbs as soon as they burn out.  Set up your furniture so you have a clear path. Avoid moving your furniture around.  If any of your floors are uneven, fix them.  If there are any pets around you, be aware of where they are.  Review your medicines with your doctor. Some medicines can make you feel dizzy. This can increase your chance of falling. Ask your doctor what other things that you can do to help prevent falls. This information is not intended to replace advice given to you by your health care provider. Make sure you discuss any questions you have with your health care provider. Document Released: 05/12/2009 Document Revised: 12/22/2015 Document Reviewed: 08/20/2014 Elsevier Interactive Patient Education  2017 [employer].

## 2020-07-04 ENCOUNTER — Ambulatory Visit (INDEPENDENT_AMBULATORY_CARE_PROVIDER_SITE_OTHER): Payer: Medicare HMO

## 2020-07-04 ENCOUNTER — Other Ambulatory Visit: Payer: Self-pay

## 2020-07-04 DIAGNOSIS — I4891 Unspecified atrial fibrillation: Secondary | ICD-10-CM | POA: Diagnosis not present

## 2020-07-04 DIAGNOSIS — Z5181 Encounter for therapeutic drug level monitoring: Secondary | ICD-10-CM | POA: Diagnosis not present

## 2020-07-04 LAB — POCT INR: INR: 3 (ref 2.0–3.0)

## 2020-07-04 NOTE — Patient Instructions (Signed)
Description   Continue on same dosage Warfarin 1/2 tablet daily except 1 tablet on Tuesdays and Saturdays. Recheck INR in 4 weeks. [facility]madin Clinic [phone]

## 2020-07-13 DIAGNOSIS — H353131 Nonexudative age-related macular degeneration, bilateral, early dry stage: Secondary | ICD-10-CM | POA: Diagnosis not present

## 2020-07-13 DIAGNOSIS — Z961 Presence of intraocular lens: Secondary | ICD-10-CM | POA: Diagnosis not present

## 2020-08-01 ENCOUNTER — Ambulatory Visit (INDEPENDENT_AMBULATORY_CARE_PROVIDER_SITE_OTHER): Payer: Medicare HMO | Admitting: Pharmacist

## 2020-08-01 ENCOUNTER — Other Ambulatory Visit: Payer: Self-pay

## 2020-08-01 DIAGNOSIS — I4891 Unspecified atrial fibrillation: Secondary | ICD-10-CM | POA: Diagnosis not present

## 2020-08-01 DIAGNOSIS — Z5181 Encounter for therapeutic drug level monitoring: Secondary | ICD-10-CM | POA: Diagnosis not present

## 2020-08-01 LAB — POCT INR: INR: 5 — AB (ref 2.0–3.0)

## 2020-08-01 NOTE — Patient Instructions (Signed)
Description   Skip dose today and tomorrow and then continue on same dosage Warfarin 1/2 tablet daily except 1 tablet on Tuesdays and Saturdays. Recheck INR in 2 weeks. Coumadin Clinic [phone]

## 2020-08-09 ENCOUNTER — Other Ambulatory Visit: Payer: Self-pay | Admitting: Family Medicine

## 2020-08-09 MED ORDER — TRAMADOL HCL 50 MG PO TABS: 50.0000 mg | ORAL_TABLET | Freq: Three times a day (TID) | ORAL | 2 refills | Status: DC | PRN

## 2020-08-15 ENCOUNTER — Other Ambulatory Visit: Payer: Self-pay | Admitting: Adult Health

## 2020-08-17 ENCOUNTER — Other Ambulatory Visit: Payer: Self-pay

## 2020-08-17 ENCOUNTER — Ambulatory Visit (INDEPENDENT_AMBULATORY_CARE_PROVIDER_SITE_OTHER): Payer: Medicare HMO | Admitting: *Deleted

## 2020-08-17 DIAGNOSIS — Z5181 Encounter for therapeutic drug level monitoring: Secondary | ICD-10-CM | POA: Diagnosis not present

## 2020-08-17 DIAGNOSIS — I4891 Unspecified atrial fibrillation: Secondary | ICD-10-CM | POA: Diagnosis not present

## 2020-08-17 LAB — POCT INR: INR: 2.1 (ref 2.0–3.0)

## 2020-08-17 NOTE — Patient Instructions (Signed)
Description     Take 1 tablet today and then continue on same dosage Warfarin 1/2 tablet daily except 1 tablet on Tuesdays and Saturdays. Recheck INR in 2 weeks. [facility]madin Clinic [phone]

## 2020-08-31 ENCOUNTER — Other Ambulatory Visit: Payer: Self-pay

## 2020-08-31 ENCOUNTER — Ambulatory Visit (INDEPENDENT_AMBULATORY_CARE_PROVIDER_SITE_OTHER): Payer: Medicare HMO | Admitting: *Deleted

## 2020-08-31 DIAGNOSIS — Z5181 Encounter for therapeutic drug level monitoring: Secondary | ICD-10-CM | POA: Diagnosis not present

## 2020-08-31 DIAGNOSIS — I4891 Unspecified atrial fibrillation: Secondary | ICD-10-CM

## 2020-08-31 LAB — POCT INR: INR: 3.6 — AB (ref 2.0–3.0)

## 2020-08-31 NOTE — Patient Instructions (Addendum)
Description   Hold today's dose then continue taking Warfarin 1/2 tablet daily except 1 tablet on Tuesdays and Saturdays. Recheck INR in 3 weeks. Coumadin Clinic [phone]

## 2020-09-05 ENCOUNTER — Other Ambulatory Visit: Payer: Self-pay | Admitting: Adult Health

## 2020-09-21 ENCOUNTER — Ambulatory Visit (INDEPENDENT_AMBULATORY_CARE_PROVIDER_SITE_OTHER): Payer: Medicare HMO | Admitting: *Deleted

## 2020-09-21 ENCOUNTER — Other Ambulatory Visit: Payer: Self-pay

## 2020-09-21 DIAGNOSIS — I4891 Unspecified atrial fibrillation: Secondary | ICD-10-CM

## 2020-09-21 DIAGNOSIS — Z5181 Encounter for therapeutic drug level monitoring: Secondary | ICD-10-CM

## 2020-09-21 LAB — POCT INR: INR: 4.5 — AB (ref 2.0–3.0)

## 2020-09-21 NOTE — Patient Instructions (Signed)
Description    Hold warfarin today and then start taking 1/2 a tablets daily excpet for 1 tablet on Saturdays. Recheck INR in 3 weeks. Call coumadin clinic for any changes in medications or upcoming procedures. [phone].

## 2020-09-27 ENCOUNTER — Other Ambulatory Visit: Payer: Self-pay | Admitting: Cardiology

## 2020-10-12 ENCOUNTER — Encounter: Payer: Self-pay | Admitting: *Deleted

## 2020-10-12 ENCOUNTER — Ambulatory Visit (INDEPENDENT_AMBULATORY_CARE_PROVIDER_SITE_OTHER): Payer: Medicare HMO | Admitting: *Deleted

## 2020-10-12 ENCOUNTER — Other Ambulatory Visit: Payer: Self-pay

## 2020-10-12 DIAGNOSIS — I4891 Unspecified atrial fibrillation: Secondary | ICD-10-CM | POA: Diagnosis not present

## 2020-10-12 DIAGNOSIS — Z5181 Encounter for therapeutic drug level monitoring: Secondary | ICD-10-CM | POA: Diagnosis not present

## 2020-10-12 LAB — POCT INR: INR: 1.5 — AB (ref 2.0–3.0)

## 2020-10-12 NOTE — Patient Instructions (Signed)
Description   Today take 1 tablet and tomorrow take 1 tablet then continue taking 1/2 tablet daily excpet for 1 tablet on Saturdays. Recheck INR in 1 week. Call coumadin clinic for any changes in medications or upcoming procedures. [phone].

## 2020-10-19 ENCOUNTER — Ambulatory Visit (INDEPENDENT_AMBULATORY_CARE_PROVIDER_SITE_OTHER): Payer: Medicare HMO | Admitting: *Deleted

## 2020-10-19 ENCOUNTER — Other Ambulatory Visit: Payer: Self-pay

## 2020-10-19 DIAGNOSIS — I4891 Unspecified atrial fibrillation: Secondary | ICD-10-CM

## 2020-10-19 DIAGNOSIS — Z5181 Encounter for therapeutic drug level monitoring: Secondary | ICD-10-CM

## 2020-10-19 LAB — POCT INR: INR: 2.4 (ref 2.0–3.0)

## 2020-10-19 NOTE — Patient Instructions (Addendum)
Description   Today take 1 tablet then continue taking 1/2 tablet daily excpet for 1 tablet on Saturdays. Recheck INR in 2 weeks with MD appt. Call coumadin clinic for any changes in medications or upcoming procedures. [phone].

## 2020-10-20 ENCOUNTER — Other Ambulatory Visit: Payer: Self-pay | Admitting: Adult Health

## 2020-10-20 DIAGNOSIS — Z760 Encounter for issue of repeat prescription: Secondary | ICD-10-CM

## 2020-11-03 ENCOUNTER — Ambulatory Visit (INDEPENDENT_AMBULATORY_CARE_PROVIDER_SITE_OTHER): Payer: Medicare HMO | Admitting: *Deleted

## 2020-11-03 ENCOUNTER — Encounter: Payer: Self-pay | Admitting: Cardiology

## 2020-11-03 ENCOUNTER — Other Ambulatory Visit: Payer: Self-pay

## 2020-11-03 ENCOUNTER — Ambulatory Visit (INDEPENDENT_AMBULATORY_CARE_PROVIDER_SITE_OTHER): Payer: Medicare HMO | Admitting: Cardiology

## 2020-11-03 VITALS — BP 140/70 | HR 71 | Ht 63.0 in | Wt 142.0 lb

## 2020-11-03 DIAGNOSIS — I6523 Occlusion and stenosis of bilateral carotid arteries: Secondary | ICD-10-CM | POA: Diagnosis not present

## 2020-11-03 DIAGNOSIS — I4891 Unspecified atrial fibrillation: Secondary | ICD-10-CM

## 2020-11-03 DIAGNOSIS — Z5181 Encounter for therapeutic drug level monitoring: Secondary | ICD-10-CM | POA: Diagnosis not present

## 2020-11-03 DIAGNOSIS — I4821 Permanent atrial fibrillation: Secondary | ICD-10-CM | POA: Diagnosis not present

## 2020-11-03 LAB — POCT INR: INR: 1.9 — AB (ref 2.0–3.0)

## 2020-11-03 NOTE — Patient Instructions (Signed)
Description    Take 1 tablet of warfarin today and then START taking warfarin 1/2 tablet daily except for 1 tablet on Tuesdays and Saturdays. Recheck INR in 2 weeks. Coumadin Clinic [phone].

## 2020-11-03 NOTE — Progress Notes (Signed)
Cardiology Office Note:    Date:  11/03/2020   ID:  Jeffery Copeland, DOB January 24, 1946, MRN [id number]  PCP:  Dorothyann Peng, NP   [place]  Cardiologist:  No primary care provider on file.  Advanced Practice Provider:  No care team member to display Electrophysiologist:  None       Referring MD: Dorothyann Peng, NP     History of Present Illness:    Jeffery Copeland is a 75 y.o. male here for the follow-up of permanent atrial fibrillation on Coumadin, prior history of endocarditis with mitral valve replacement bioprosthetic in 1999 with redo valve in 2014.  Also has carotid stenosis moderate 59%.  In December 2019 had a protruding sternal wire that was taken care of by Dr. Roxy Manns.  Overall he is mild shortness of breath with activity which is no change from normal for him given his COPD.  Feels well.  Denies any fevers chills nausea vomiting syncope bleeding.  Past Medical History:  Diagnosis Date  . Acute and subacute bacterial endocarditis 1999   group G Streptococcus  . Atrial fibrillation ([place]) 11/11/2008   Qualifier: Diagnosis of  By: Lia Foyer, MD, Jaquelyn Bitter   . CARDIOMYOPATHY 10/20/2008   Qualifier: Diagnosis of  By: Owens Shark, RN, BSN, Lauren    . Cerebrovascular disease, unspecified   . Closed right acetabular fracture ([place])   . COPD (chronic obstructive pulmonary disease) ([place]) 1999  . Epidural hematoma ([place]) 03/01/2011   Recent fall 2012   . Esophageal reflux   . Generalized osteoarthrosis, unspecified site   . History of tobacco abuse   . Hypertension   . HYPERTENSION, BENIGN 10/20/2008   Qualifier: Diagnosis of  By: Owens Shark, RN, BSN, Lauren    . Hyponatremia   . Insomnia   . Iron deficiency anemia, unspecified   . ISCHEMIC COLITIS, HX OF 10/20/2008   Qualifier: Diagnosis of  By: Owens Shark, RN, BSN, Lauren    . Lung nodules 2008, 2014   LLL nodule removed 2008 with LLL lobectomy,  RLL nodule 77mm observation  . Prostate cancer ([place]) 2015  .  Protruding sternal wires   . S/P mitral valve replacement 02/01/1998   Carpentier-Edwards porcine bioprosthetic tissue valve, size 37mm Placed for acute and subacute bacterial endocarditis (group G Streptococcus) with pre-existing mitral valve prolapse   . S/P mitral valve replacement with bioprosthetic valve 03/24/2013   Redo mitral valve replacement using 28mm [facility] mitral bovine bioprosthetic tissue valve performed via right mini thoracotomy  . Seizures ([place])    last seizure 1 year ago  . Severe mitral regurgitation 02/20/2013  . Unsteady gait     Past Surgical History:  Procedure Laterality Date  . CARDIAC VALVE REPLACEMENT    . INTRAOPERATIVE TRANSESOPHAGEAL ECHOCARDIOGRAM N/A 03/24/2013   Procedure: INTRAOPERATIVE TRANSESOPHAGEAL ECHOCARDIOGRAM;  Surgeon: Rexene Alberts, MD;  Location: [place];  Service: Open Heart Surgery;  Laterality: N/A;  . MINIMALLY INVASIVE MAZE PROCEDURE N/A 03/24/2013   Procedure: MINIMALLY INVASIVE MAZE PROCEDURE;  Surgeon: Rexene Alberts, MD;  Location: [facility];  Service: Open Heart Surgery;  Laterality: N/A;  . MITRAL VALVE REPLACEMENT  02/01/1998   Carpentier-Edwards porcine bioprosthetic tissue valve, size 75mm, placed for complicated bacterial endocarditis  . MITRAL VALVE REPLACEMENT Right 03/24/2013   Procedure: MINIMALLY INVASIVE REDO MITRAL VALVE (MV) REPLACEMENT;  Surgeon: Rexene Alberts, MD;  Location: [facility];  Service: Open Heart Surgery;  Laterality: Right;  . PROSTATE BIOPSY    . radiation  treatment tx for prostate cancer  2015  . STERNAL WIRES REMOVAL N/A 07/10/2018   Procedure: STERNAL WIRES REMOVAL;  Surgeon: Rexene Alberts, MD;  Location: [facility];  Service: Thoracic;  Laterality: N/A;  . TEE WITHOUT CARDIOVERSION N/A 02/12/2013   Procedure: TRANSESOPHAGEAL ECHOCARDIOGRAM (TEE);  Surgeon: Fay Records, MD;  Location: [facility] ENDOSCOPY;  Service: Cardiovascular;  Laterality: N/A;  . VIDEO ASSISTED THORACOSCOPY  04/29/2007   Left VATS w/ mini  thoracotomy for Left Lower Lobectomy for benign lung nodules    Current Medications: Current Meds  Medication Sig  . ezetimibe (ZETIA) 10 MG tablet TAKE 1 TABLET EVERY DAY  . levETIRAcetam (KEPPRA) 500 MG tablet Take 1 tablet (500 mg total) by mouth 2 (two) times daily.  . metoprolol tartrate (LOPRESSOR) 25 MG tablet Take 0.5 tablets (12.5 mg total) by mouth 2 (two) times daily.  . phenytoin (DILANTIN) 100 MG ER capsule Take 3 capsules (300 mg total) by mouth at bedtime.  Marland Kitchen tiotropium (SPIRIVA HANDIHALER) 18 MCG inhalation capsule INHALE THE CONTENTS OF 1 CAPSULE EVERY DAY VIA HANDIHALER ( NEED MD APPOINTMENT FOR REFILLS )  . tiZANidine (ZANAFLEX) 4 MG tablet TAKE 1 TABLET EVERY 6 HOURS AS NEEDED FOR MUSCLE SPASMS  . traMADol (ULTRAM) 50 MG tablet Take 1 tablet (50 mg total) by mouth every 8 (eight) hours as needed.  . warfarin (COUMADIN) 6 MG tablet TAKE AS DIRECTED BY COUMADIN CLINIC     Allergies:   Ace inhibitors, Lisinopril, and Rosuvastatin   Social History   Socioeconomic History  . Marital status: Divorced    Spouse name: Not on file  . Number of children: 2  . Years of education: 9  . Highest education level: Not on file  Occupational History  . Occupation: disabled  Tobacco Use  . Smoking status: Former Smoker    Packs/day: 2.00    Years: 45.00    Pack years: 90.00    Types: Cigarettes    Quit date: 07/30/2006    Years since quitting: 14.2  . Smokeless tobacco: Never Used  Vaping Use  . Vaping Use: Never used  Substance and Sexual Activity  . Alcohol use: No    Alcohol/week: 0.0 standard drinks    Comment: previous alcohol use & home-made alcohol consumption  . Drug use: No  . Sexual activity: Not Currently  Other Topics Concern  . Not on file  Social History Narrative   Patient is divorced and has 2 children.   Patient is right handed.   Patient has 9 th grade education.    Patient drinks diet sodas.      [facility]:   From [place] originally. Always  lived in [place]. 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 Previously traveled to [place], [place], & [place]. No pets currently. Remote pet owl and parakeet exposure. No mold exposure. Enjoys fishing.    Social Determinants of Health   Financial Resource Strain: Low Risk   . Difficulty of Paying Living Expenses: Not hard at all  Food Insecurity: No Food Insecurity  . Worried About Charity fundraiser in the Last Year: Never true  . Ran Out of Food in the Last Year: Never true  Transportation Needs: No Transportation Needs  . Lack of Transportation (Medical): No  . Lack of Transportation (Non-Medical): No  Physical Activity: Insufficiently Active  . Days of Exercise per Week: 7 days  . Minutes of Exercise per Session: 20 min  Stress: No Stress Concern Present  .  Feeling of Stress : Not at all  Social Connections: Socially Isolated  . Frequency of Communication with Friends and Family: More than three times a week  . Frequency of Social Gatherings with Friends and Family: More than three times a week  . Attends Religious Services: Never  . Active Member of Clubs or Organizations: No  . Attends Archivist Meetings: Never  . Marital Status: Divorced     Family History: The patient's family history includes Arthritis in his mother; Cancer in his brother and father; Heart attack in his father; Tuberculosis in his father. There is no history of Lung disease.  ROS:   Please see the history of present illness.     All other systems reviewed and are negative.  EKGs/Labs/Other Studies Reviewed:    The following studies were reviewed today:   ECHO 03/07/18: - Left ventricle: The cavity size was normal. Wall thickness was normal. Systolic function was normal. The estimated ejection fraction was in the range of 55% to 60%. Wall motion was normal; there were no regional wall motion abnormalities. - Aortic valve: There was trivial regurgitation. - Aortic root:  The aortic root was mildly dilated. - Mitral valve: A bioprosthesis was present. - Left atrium: The atrium was severely dilated. - Pulmonary arteries: Systolic pressure was moderately increased. PA peak pressure: 48 mm Hg (S).  Impressions:  - Normal LV systolic function; trace AI; mildly dilated aortic root; s/p MVR with normal mean gradient of 3 mmHg; severe LAE; mild TR with moderate pulmonary hypertension.  Carotid Doppler 03/08/20   Summary:  Right Carotid: Velocities in the right ICA are consistent with a 1-39%  stenosis.         Non-hemodynamically significant plaque <50% noted in the  CCA.   Left Carotid: Velocities in the left ICA are consistent with a 1-39%  stenosis.        Non-hemodynamically significant plaque <50% noted in the  CCA.   Vertebrals: Bilateral vertebral arteries demonstrate antegrade flow.  Subclavians: Normal flow hemodynamics were seen in bilateral subclavian        arteries.   *See table(s) above for measurements and observations.  Suggest follow up study in 12 months. Stable from prior.  EKG:  EKG is  ordered today.  The ekg ordered today demonstrates atrial flutter type underlying rhythm with slight beat to beat variability with QRS and a atrial bigeminy type pattern.  Heart rate 71 bpm.  Recent Labs: 02/26/2020: ALT 18; BUN 8; Creat 1.16; Hemoglobin 12.9; Platelets 189; Potassium 4.2; Sodium 139; TSH 1.50  Recent Lipid Panel    Component Value Date/Time   CHOL 135 02/26/2020 0817   CHOL 133 02/28/2018 0948   TRIG 48 02/26/2020 0817   HDL 65 02/26/2020 0817   HDL 70 02/28/2018 0948   CHOLHDL 2.1 02/26/2020 0817   VLDL 12.6 02/06/2019 1031   LDLCALC 56 02/26/2020 0817     Risk Assessment/Calculations:      Physical Exam:    VS:  BP 140/70 (BP Location: Left Arm, Patient Position: Sitting, Cuff Size: Normal)   Pulse 71   Ht 5\' 3"  (1.6 m)   Wt 142 lb (64.4 kg)   BMI 25.15 kg/m     Wt Readings  from Last 3 Encounters:  11/03/20 142 lb (64.4 kg)  06/20/20 137 lb 7 oz (62.3 kg)  02/26/20 142 lb (64.4 kg)     GEN:  Well nourished, well developed in no acute distress, uses a walker HEENT:  Normal NECK: No JVD; No carotid bruits LYMPHATICS: No lymphadenopathy CARDIAC: Reg irreg, no murmurs, rubs, gallops RESPIRATORY:  Clear to auscultation without rales, wheezing or rhonchi  ABDOMEN: Soft, non-tender, non-distended MUSCULOSKELETAL:  No edema; No deformity  SKIN: Warm and dry NEUROLOGIC:  Alert and oriented x 3 PSYCHIATRIC:  Normal affect   ASSESSMENT:    1. Permanent atrial fibrillation ([place])   2. Bilateral carotid artery stenosis    PLAN:    In order of problems listed above:  Mitral valve replacement x2 -1999, 2014-prior endocarditis.  Continue with dental prophylaxis however he is edentulous.  Echocardiogram as above shows stable bioprosthesis. Cow and Pig both he states.  Next year we will likely check another echocardiogram for maintenance therapy.  Permanent atrial fibrillation -Remains on Coumadin for chronic anticoagulation doing well with good rate control.  Continue to monitor hemoglobin and INRs.  This is being performed at our clinic.  Chronic diastolic heart failure -Euvolemic, doing well.  COPD -Pulmonary medicine is followed in the past.  Stable.  Carotid artery disease -Moderate.  See above.  No change from prior.  Repeating yearly.  Seizure disorder -No recent seizures, medications reviewed and followed by neurology.  Hyperlipidemia -LDL previously 51.  Currently on Zetia 10.  Doing well.  Most recent LDL 56, TSH 1.5 creatinine 1.16  1 year   Medication Adjustments/Labs and Tests Ordered: Current medicines are reviewed at length with the patient today.  Concerns regarding medicines are outlined above.  Orders Placed This Encounter  Procedures  . EKG 12-Lead   No orders of the defined types were placed in this encounter.   Patient  Instructions  Medication Instructions:  The current medical regimen is effective;  continue present plan and medications.  *If you need a refill on your cardiac medications before your next appointment, please call your pharmacy*  Follow-Up: At [facility], you and your health needs are our priority.  As part of our continuing mission to provide you with exceptional heart care, we have created designated Provider Care Teams.  These Care Teams include your primary Cardiologist (physician) and Advanced Practice Providers (APPs -  Physician Assistants and Nurse Practitioners) who all work together to provide you with the care you need, when you need it.  We recommend signing up for the patient portal called "MyChart".  Sign up information is provided on this After Visit Summary.  MyChart is used to connect with patients for Virtual Visits (Telemedicine).  Patients are able to view lab/test results, encounter notes, upcoming appointments, etc.  Non-urgent messages can be sent to your provider as well.   To learn more about what you can do with MyChart, go to [email or web address].    Your next appointment:   12 month(s)  The format for your next appointment:   In Person  Provider:   Candee Furbish, MD   Thank you for choosing [facility]!!        Signed, Candee Furbish, MD  11/03/2020 3:57 PM    [place]

## 2020-11-03 NOTE — Patient Instructions (Signed)
Medication Instructions:  The current medical regimen is effective;  continue present plan and medications.  *If you need a refill on your cardiac medications before your next appointment, please call your pharmacy*  Follow-Up: At [facility], you and your health needs are our priority.  As part of our continuing mission to provide you with exceptional heart care, we have created designated Provider Care Teams.  These Care Teams include your primary Cardiologist (physician) and Advanced Practice Providers (APPs -  Physician Assistants and Nurse Practitioners) who all work together to provide you with the care you need, when you need it.  We recommend signing up for the patient portal called "MyChart".  Sign up information is provided on this After Visit Summary.  MyChart is used to connect with patients for Virtual Visits (Telemedicine).  Patients are able to view lab/test results, encounter notes, upcoming appointments, etc.  Non-urgent messages can be sent to your provider as well.   To learn more about what you can do with MyChart, go to [email or web address].    Your next appointment:   12 month(s)  The format for your next appointment:   In Person  Provider:   Mark Skains, MD   Thank you for choosing [facility]!!      

## 2020-11-17 ENCOUNTER — Other Ambulatory Visit: Payer: Self-pay

## 2020-11-17 ENCOUNTER — Ambulatory Visit (INDEPENDENT_AMBULATORY_CARE_PROVIDER_SITE_OTHER): Payer: Medicare HMO | Admitting: *Deleted

## 2020-11-17 DIAGNOSIS — I4891 Unspecified atrial fibrillation: Secondary | ICD-10-CM | POA: Diagnosis not present

## 2020-11-17 DIAGNOSIS — Z5181 Encounter for therapeutic drug level monitoring: Secondary | ICD-10-CM | POA: Diagnosis not present

## 2020-11-17 LAB — POCT INR: INR: 2.7 (ref 2.0–3.0)

## 2020-11-17 NOTE — Patient Instructions (Signed)
Description   Continue taking warfarin 1/2 tablet daily except for 1 tablet on Tuesdays and Saturdays. Recheck INR in 4 weeks. Coumadin Clinic [phone].

## 2020-11-23 ENCOUNTER — Other Ambulatory Visit: Payer: Self-pay | Admitting: Adult Health

## 2020-12-14 ENCOUNTER — Other Ambulatory Visit: Payer: Self-pay | Admitting: Adult Health

## 2020-12-15 ENCOUNTER — Ambulatory Visit (INDEPENDENT_AMBULATORY_CARE_PROVIDER_SITE_OTHER): Payer: Medicare HMO | Admitting: *Deleted

## 2020-12-15 ENCOUNTER — Other Ambulatory Visit: Payer: Self-pay

## 2020-12-15 DIAGNOSIS — Z5181 Encounter for therapeutic drug level monitoring: Secondary | ICD-10-CM

## 2020-12-15 DIAGNOSIS — I4891 Unspecified atrial fibrillation: Secondary | ICD-10-CM | POA: Diagnosis not present

## 2020-12-15 LAB — POCT INR: INR: 2.2 (ref 2.0–3.0)

## 2020-12-15 NOTE — Patient Instructions (Addendum)
Description   Today take 1 tablet then continue taking warfarin 1/2 tablet daily except for 1 tablet on Tuesdays and Saturdays. Recheck INR in 4 weeks. Coumadin Clinic [phone].

## 2020-12-20 ENCOUNTER — Other Ambulatory Visit: Payer: Self-pay | Admitting: Adult Health

## 2020-12-20 DIAGNOSIS — Z760 Encounter for issue of repeat prescription: Secondary | ICD-10-CM

## 2020-12-20 NOTE — Telephone Encounter (Signed)
Okay for refill?    LOV 02/26/20   Last Refill   08/09/2020    QTY.  90       Refills 2

## 2021-01-06 ENCOUNTER — Other Ambulatory Visit: Payer: Self-pay | Admitting: Adult Health

## 2021-01-19 ENCOUNTER — Ambulatory Visit (INDEPENDENT_AMBULATORY_CARE_PROVIDER_SITE_OTHER): Payer: Medicare HMO | Admitting: Pharmacist

## 2021-01-19 ENCOUNTER — Other Ambulatory Visit: Payer: Self-pay

## 2021-01-19 DIAGNOSIS — Z5181 Encounter for therapeutic drug level monitoring: Secondary | ICD-10-CM | POA: Diagnosis not present

## 2021-01-19 DIAGNOSIS — I4891 Unspecified atrial fibrillation: Secondary | ICD-10-CM | POA: Diagnosis not present

## 2021-01-19 LAB — PROTIME-INR
INR: 6.2 (ref 0.9–1.2)
Prothrombin Time: 57.5 s — ABNORMAL HIGH (ref 9.1–12.0)

## 2021-01-19 LAB — POCT INR: INR: 6.6 — AB (ref 2.0–3.0)

## 2021-01-25 ENCOUNTER — Other Ambulatory Visit: Payer: Self-pay

## 2021-01-25 ENCOUNTER — Ambulatory Visit (INDEPENDENT_AMBULATORY_CARE_PROVIDER_SITE_OTHER): Payer: Medicare HMO | Admitting: *Deleted

## 2021-01-25 DIAGNOSIS — Z5181 Encounter for therapeutic drug level monitoring: Secondary | ICD-10-CM | POA: Diagnosis not present

## 2021-01-25 DIAGNOSIS — Z953 Presence of xenogenic heart valve: Secondary | ICD-10-CM | POA: Diagnosis not present

## 2021-01-25 DIAGNOSIS — I4891 Unspecified atrial fibrillation: Secondary | ICD-10-CM | POA: Diagnosis not present

## 2021-01-25 LAB — POCT INR: INR: 1.7 — AB (ref 2.0–3.0)

## 2021-01-25 NOTE — Patient Instructions (Addendum)
Description   Take 1 tablet of warfarin today and tomorrow, then continue to take 1/2 a tablet daily except for 1 tablet on Tuesday and Saturdays. Recheck INR in 1 week. Coumadin Clinic [phone].

## 2021-02-01 ENCOUNTER — Other Ambulatory Visit: Payer: Self-pay | Admitting: Adult Health

## 2021-02-03 ENCOUNTER — Other Ambulatory Visit: Payer: Self-pay

## 2021-02-03 ENCOUNTER — Ambulatory Visit (INDEPENDENT_AMBULATORY_CARE_PROVIDER_SITE_OTHER): Payer: Medicare HMO | Admitting: Pharmacist

## 2021-02-03 DIAGNOSIS — Z5181 Encounter for therapeutic drug level monitoring: Secondary | ICD-10-CM

## 2021-02-03 DIAGNOSIS — I4891 Unspecified atrial fibrillation: Secondary | ICD-10-CM | POA: Diagnosis not present

## 2021-02-03 LAB — POCT INR: INR: 4.3 — AB (ref 2.0–3.0)

## 2021-02-03 NOTE — Patient Instructions (Signed)
Description   Hold dose today, then continue to take 1/2 a tablet daily except for 1 tablet on Tuesday and Saturdays. Recheck INR in 1 week. Coumadin Clinic [phone].

## 2021-02-10 ENCOUNTER — Ambulatory Visit (INDEPENDENT_AMBULATORY_CARE_PROVIDER_SITE_OTHER): Payer: Medicare HMO

## 2021-02-10 ENCOUNTER — Other Ambulatory Visit: Payer: Self-pay

## 2021-02-10 DIAGNOSIS — Z5181 Encounter for therapeutic drug level monitoring: Secondary | ICD-10-CM

## 2021-02-10 DIAGNOSIS — I4891 Unspecified atrial fibrillation: Secondary | ICD-10-CM

## 2021-02-10 LAB — POCT INR: INR: 2.3 (ref 2.0–3.0)

## 2021-02-10 NOTE — Patient Instructions (Signed)
-   take 1 tablet warfarin today, then continue to take 1/2 a tablet daily except for 1 tablet on Tuesday and Saturdays.  - Recheck INR in 1 week.  Coumadin Clinic [phone].

## 2021-02-17 ENCOUNTER — Ambulatory Visit (INDEPENDENT_AMBULATORY_CARE_PROVIDER_SITE_OTHER): Payer: Medicare HMO

## 2021-02-17 ENCOUNTER — Other Ambulatory Visit: Payer: Self-pay

## 2021-02-17 DIAGNOSIS — Z5181 Encounter for therapeutic drug level monitoring: Secondary | ICD-10-CM

## 2021-02-17 DIAGNOSIS — I4891 Unspecified atrial fibrillation: Secondary | ICD-10-CM

## 2021-02-17 LAB — POCT INR: INR: 3.2 — AB (ref 2.0–3.0)

## 2021-02-17 NOTE — Patient Instructions (Signed)
Description   Continue to take 1/2 a tablet daily except for 1 tablet on Tuesday and Saturdays.  Recheck INR in 3 weeks. Coumadin Clinic [phone].

## 2021-02-20 NOTE — Progress Notes (Signed)
PATIENT: Jeffery Copeland DOB: 1946-01-09  REASON FOR VISIT: follow up for stroke and seizure history HISTORY FROM: patient    Chief complaint: Chief Complaint  Patient presents with   Follow-up    Rm 3 alone here for f/u. Reports he has been doing ok denies any seizure events       HISTORY OF PRESENT ILLNESS:  Today, 02/20/2021, Jeffery Copeland returns for yearly stroke and seizure follow-up unaccompanied.  Been doing well since prior visit without new stroke/TIA symptoms or seizure activity.  Compliant on Keppra and Dilantin without associated side effects.  Compliant on warfarin and Zetia without associated side effects.  Blood pressure today 124/62.  No new concerns at this time.    History provided for reference purposes only Update 11/05/2019 JM: Jeffery Copeland is being seen today for history of stroke and seizures.  He has been stable since prior visit 1 year ago -denies seizures or stroke symptoms. Ambulate with rolling walker maintaining ADLs independently.  Continues on Keppra and Dilantin -denies side effects.  Continues on warfarin for history of atrial fibrillation and secondary stroke prevention with stable INR levels.  Blood pressure today satisfactory 138/78.  No concerns at this time.  Update 11/03/2018 JM: Jeffery Copeland continues to be followed in this office for seizure and medication management with one-year follow-up visit scheduled today.  He continues on Keppra and Dilantin which she tolerates well without any recurrent seizure activity.  At prior visit, 10/22/2017, lab work obtained which were all satisfactory.  He continues to complete all ADLs and IADLs independently but he does not drive at this time.  He continues on warfarin without side effects of bleeding or bruising with most recent INR level elevated at 4.1 with goal 2.5-3.5. He plans on obtaining repeat level on 11/17/18.  He continues to receive routine lab work and management through anticoagulation clinic.  Denies any new or  recurrent stroke/TIA symptoms.   Update 10/22/2017 MM: Jeffery Copeland is a 75 year old male with a history of seizures and stroke.  He returns today for follow-up.  He remains on Keppra and Dilantin.  He denies any seizure events.  He does not operate a motor vehicle.  He is able to complete all ADLs independently.  He denies any changes with his gait or balance.  Denies any recent falls.  He remains on Coumadin.  Reports that his INR numbers have remained stable.  He denies any strokelike symptoms.  He returns today for evaluation.  Update 04/24/17 MM: Jeffery Copeland is a 75 year old male with a history of seizure events. He returns today for follow-up. He remains on Keppra 500 mg twice a day as well as Dilantin 100 mg at bedtime. He denies any seizure events. He lives at home alone. He is able to complete all ADLs independently. He does not operate a motor vehicle. He reports that his hip is better but still sore. He continues to use a walker when ambulating. He denies any new neurological symptoms. He returns today for an evaluation.   REVIEW OF SYSTEMS: Out of a complete 14 system review of symptoms, the patient complains only of the following symptoms, and all other reviewed systems are negative. No complaints   ALLERGIES: Allergies  Allergen Reactions   Ace Inhibitors Swelling   Lisinopril     Seizures    Rosuvastatin Other (See Comments)    Muscle aches    HOME MEDICATIONS: Outpatient Medications Prior to Visit  Medication Sig Dispense Refill  ezetimibe (ZETIA) 10 MG tablet TAKE 1 TABLET EVERY DAY 90 tablet 3   levETIRAcetam (KEPPRA) 500 MG tablet Take 1 tablet (500 mg total) by mouth 2 (two) times daily. Must keep upcoming appt for further refills 180 tablet 0   metoprolol tartrate (LOPRESSOR) 25 MG tablet Take 0.5 tablets (12.5 mg total) by mouth 2 (two) times daily. 90 tablet 3   phenytoin (DILANTIN) 100 MG ER capsule Take 3 capsules (300 mg total) by mouth at bedtime. 270 capsule 3    tiotropium (SPIRIVA HANDIHALER) 18 MCG inhalation capsule INHALE THE CONTENTS OF 1 CAPSULE EVERY DAY VIA HANDIHALER ( NEED MD APPOINTMENT FOR REFILLS ) 90 capsule 3   tiZANidine (ZANAFLEX) 4 MG tablet TAKE 1 TABLET EVERY 6 HOURS AS NEEDED FOR MUSCLE SPASMS 360 tablet 1   traMADol (ULTRAM) 50 MG tablet TAKE 1 TABLET EVERY 8 HOURS AS NEEDED 90 tablet 2   warfarin (COUMADIN) 6 MG tablet TAKE AS DIRECTED BY COUMADIN CLINIC 70 tablet 1   No facility-administered medications prior to visit.    PAST MEDICAL HISTORY: Past Medical History:  Diagnosis Date   Acute and subacute bacterial endocarditis 1999   group G Streptococcus   Atrial fibrillation ([place]) 11/11/2008   Qualifier: Diagnosis of  By: Lia Foyer, MD, Jaquelyn Bitter    CARDIOMYOPATHY 10/20/2008   Qualifier: Diagnosis of  By: Owens Shark, RN, BSN, Lauren     Cerebrovascular disease, unspecified    Closed right acetabular fracture ([place])    COPD (chronic obstructive pulmonary disease) ([place]) 1999   Epidural hematoma ([place]) 03/01/2011   Recent fall 2012    Esophageal reflux    Generalized osteoarthrosis, unspecified site    History of tobacco abuse    Hypertension    HYPERTENSION, BENIGN 10/20/2008   Qualifier: Diagnosis of  By: Owens Shark, RN, BSN, Lauren     Hyponatremia    Insomnia    Iron deficiency anemia, unspecified    ISCHEMIC COLITIS, HX OF 10/20/2008   Qualifier: Diagnosis of  By: Owens Shark, RN, BSN, Lauren     Lung nodules 2008, 2014   LLL nodule removed 2008 with LLL lobectomy,  RLL nodule 44m observation   [place]state cancer ([place]   Protruding sternal wires    S/P mitral valve replacement 02/01/1998   Carpentier-Edwards porcine bioprosthetic tissue valve, size 334mPlaced for acute and subacute bacterial endocarditis (group G Streptococcus) with pre-existing mitral valve prolapse    S/P mitral valve replacement with bioprosthetic valve 03/24/2013   Redo mitral valve [id number]  valve performed via right mini thoracotomy   Seizures ([place]  last seizure 1 year ago   Severe mitral regurgitation 02/20/2013   Unsteady gait     PAST SURGICAL HISTORY: Past Surgical History:  Procedure Laterality Date   CARDIAC VALVE REPLACEMENT     INTRAOPERATIVE TRANSESOPHAGEAL ECHOCARDIOGRAM N/A 03/24/2013   Procedure: INTRAOPERATIVE TRANSESOPHAGEAL ECHOCARDIOGRAM;  Surgeon: ClaRexene AlbertsD;  [facility];  Laterality: N/A;   MINIMALLY INVASIVE MAZE PROCEDURE N/A 03/24/2013   Procedure: MINIMALLY INVASIVE MAZE PROCEDURE;  Surgeon: ClaRexene AlbertsD;  [facility];  Laterality: N/A;   MITRAL VALVE REPLACEMENT  02/01/1998   Carpentier-Edwards porcine bioprosthetic tissue valve, size 30m58mlaced for complicated bacterial endocarditis   MITRAL VALVE REPLACEMENT Right 03/24/2013   Procedure: MINIMALLY INVASIVE REDO MITRAL VALVE (MV) REPLACEMENT;  Surgeon: ClarRexene Alberts;  [facility];  Service: Open Heart Surgery;  Laterality: Right;   PROSTATE BIOPSY     radiation treatment tx for prostate cancer  2015   STERNAL WIRES REMOVAL N/A 07/10/2018   Procedure: STERNAL WIRES REMOVAL;  Surgeon: Rexene Alberts, MD;  Location: [place];  Service: Thoracic;  Laterality: N/A;   TEE WITHOUT CARDIOVERSION N/A 02/12/2013   Procedure: TRANSESOPHAGEAL ECHOCARDIOGRAM (TEE);  Surgeon: Fay Records, MD;  [facility];  Service: Cardiovascular;  Laterality: N/A;   VIDEO ASSISTED THORACOSCOPY  04/29/2007   Left VATS w/ mini thoracotomy for Left Lower Lobectomy for benign lung nodules    FAMILY HISTORY: Family History  Problem Relation Age of Onset   Cancer Father        prostate cancer   Tuberculosis Father    Heart attack Father    Cancer Brother        prostate cancer   Arthritis Mother    Lung disease Neg Hx     SOCIAL HISTORY: Social History   Socioeconomic History   Marital status: Divorced    Spouse  name: Not on file   Number of children: 2   Years of education: 9   Highest education level: Not on file  Occupational History   Occupation: disabled  Tobacco Use   Smoking status: Former    Packs/day: 2.00    Years: 45.00    Pack years: 90.00    Types: Cigarettes    Quit date: 07/30/2006    Years since quitting: 14.5   Smokeless tobacco: Never  Vaping Use   Vaping Use: Never used  Substance and Sexual Activity   Alcohol use: No    Alcohol/week: 0.0 standard drinks    Comment: previous alcohol use & home-made alcohol consumption   Drug use: No   Sexual activity: Not Currently  Other Topics Concern   Not on file  Social History Narrative   Patient is divorced and has 2 children.   Patient is right handed.   Patient has 9 th grade education.    Patient drinks diet sodas.      [place] [facility]:   From [place] originally. Always lived in [place]. 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 Previously traveled to [place], [place], & [place]. No pets currently. Remote pet owl and parakeet exposure. No mold exposure. Enjoys fishing.    Social Determinants of Health   Financial Resource Strain: Low Risk    Difficulty of Paying Living Expenses: Not hard at all  Food Insecurity: No Food Insecurity   Worried About Charity fundraiser in the Last Year: Never true   [place] in the Last Year: Never true  Transportation Needs: No Transportation Needs   Lack of Transportation (Medical): No   Lack of Transportation (Non-Medical): No  Physical Activity: Insufficiently Active   Days of Exercise per Week: 7 days   Minutes of Exercise per Session: 20 min  Stress: No Stress Concern Present   Feeling of Stress : Not at all  Social Connections: Socially Isolated   Frequency of Communication with Friends and Family: More than three times a week   Frequency of Social Gatherings with Friends and Family: More than three times a week   Attends Religious Services: Never   Building surveyor or Organizations: No   Attends Archivist Meetings: Never   Marital Status: Divorced  Human resources officer Violence: Not At Risk   Fear of Current or Ex-Partner: No  Emotionally Abused: No   Physically Abused: No   Sexually Abused: No      PHYSICAL EXAM  Vitals:   02/21/21 0947  BP: 124/62  Pulse: (!) 50  SpO2: 96%  Weight: 137 lb (62.1 kg)  Height: '5\' 3"'$  (1.6 m)    Body mass index is 24.27 kg/m.  General: well developed, well nourished,  very pleasant elderly Caucasian male, seated, in no evident distress   Neurologic Exam Mental Status: Awake and fully alert. Oriented to place and time. Recent and remote memory intact. Attention span, concentration and fund of knowledge appropriate. Mood and affect appropriate.  Cranial Nerves: Pupils equal, briskly reactive to light. Extraocular movements full without nystagmus. Visual fields full to confrontation. HOH bilaterally. Facial sensation intact. Face, tongue, palate moves normally and symmetrically.  Motor: Normal bulk and tone. Normal strength in all tested extremity muscles. Sensory.: intact to touch , pinprick , position and vibratory sensation.  Coordination: Rapid alternating movements normal in all extremities. Finger-to-nose and heel-to-shin performed accurately bilaterally. Gait and Station: Arises from chair without difficulty. Stance is hunched. Gait demonstrates normal stride length and balance with use of rolling walker Reflexes: 1+ and symmetric. Toes downgoing.     DIAGNOSTIC DATA (LABS, IMAGING, TESTING) - I reviewed patient records, labs, notes, testing and imaging myself where available.  Lab Results  Component Value Date   WBC 5.3 02/26/2020   HGB 12.9 (L) 02/26/2020   HCT 37.0 (L) 02/26/2020   MCV 96.6 02/26/2020   PLT 189 02/26/2020      Component Value Date/Time   NA 139 02/26/2020 0817   NA 138 11/05/2019 0910   K 4.2 02/26/2020 0817   CL 103 02/26/2020 0817   CO2 28  02/26/2020 0817   GLUCOSE 87 02/26/2020 0817   BUN 8 02/26/2020 0817   BUN 10 11/05/2019 0910   CREATININE 1.16 02/26/2020 0817   CALCIUM 9.0 02/26/2020 0817   PROT 6.3 02/26/2020 0817   PROT 6.5 11/05/2019 0910   ALBUMIN 4.5 11/05/2019 0910   AST 19 02/26/2020 0817   ALT 18 02/26/2020 0817   ALKPHOS 118 (H) 11/05/2019 0910   BILITOT 0.5 02/26/2020 0817   BILITOT 0.3 11/05/2019 0910   GFRNONAA 62 02/26/2020 0817   GFRAA 71 02/26/2020 0817   Lab Results  Component Value Date   CHOL 135 02/26/2020   HDL 65 02/26/2020   LDLCALC 56 02/26/2020   TRIG 48 02/26/2020   CHOLHDL 2.1 02/26/2020   Lab Results  Component Value Date   HGBA1C 5.4 03/20/2013   No results found for: VITAMINB12 Lab Results  Component Value Date   TSH 1.50 02/26/2020      ASSESSMENT AND PLAN 75 y.o. year old male  has a past medical history of Acute and subacute bacterial endocarditis (1999), Atrial fibrillation ([place]) (11/11/2008), CARDIOMYOPATHY (10/20/2008), Cerebrovascular disease, unspecified, Closed right acetabular fracture ([place]), COPD (chronic obstructive pulmonary disease) ([place]) (1999), Epidural hematoma ([place]) (03/01/2011), Esophageal reflux, Generalized osteoarthrosis, unspecified site, History of tobacco abuse, Hypertension, HYPERTENSION, BENIGN (10/20/2008), Hyponatremia, Insomnia, Iron deficiency anemia, unspecified, ISCHEMIC COLITIS, HX OF (10/20/2008), Lung nodules (2008, 2014), Prostate cancer ([place]) (2015), Protruding sternal wires, S/P mitral valve replacement (02/01/1998), S/P mitral valve replacement with bioprosthetic valve (03/24/2013), Seizures ([place]), Severe mitral regurgitation (02/20/2013), and Unsteady gait. here with:  1.  Seizures 2.  History of stroke   Overall the patient has done well.  He will continue on Keppra and Dilantin for seizure prophylaxis - will provide refills.  We  will check lab work today for medication management.  He remains on Coumadin for stroke prevention and history of  atrial fibrillation recently followed by Coumadin clinic.  He is advised that if his symptoms worsen or he develops new symptoms he should let us know.    Follow-up in 1 year or call earlier as needed   CC:  [facility] provider: Dr. Kathleen Lime, Tommi Rumps, NP     I spent 23 minutes of face-to-face and non-face-to-face time with patient.  This included previsit chart review, lab review, study review, order entry, electronic health record documentation, patient education and discussion regarding history of seizures and stroke, ongoing compliance with prescribed medication indication for lab work and answered all other questions to patient's satisfaction   Frann Rider, [facility]  [facility] [address] [place], [place] [address]  Phone [phone] Fax [phone] Note: This document was prepared with digital dictation and possible smart phrase technology. Any transcriptional errors that result from this process are unintentional.

## 2021-02-21 ENCOUNTER — Other Ambulatory Visit: Payer: Self-pay | Admitting: Adult Health

## 2021-02-21 ENCOUNTER — Encounter: Payer: Self-pay | Admitting: Adult Health

## 2021-02-21 ENCOUNTER — Ambulatory Visit (INDEPENDENT_AMBULATORY_CARE_PROVIDER_SITE_OTHER): Payer: Medicare HMO | Admitting: Adult Health

## 2021-02-21 VITALS — BP 124/62 | HR 50 | Ht 63.0 in | Wt 137.0 lb

## 2021-02-21 DIAGNOSIS — E785 Hyperlipidemia, unspecified: Secondary | ICD-10-CM

## 2021-02-21 DIAGNOSIS — Z5181 Encounter for therapeutic drug level monitoring: Secondary | ICD-10-CM | POA: Diagnosis not present

## 2021-02-21 DIAGNOSIS — Z8673 Personal history of transient ischemic attack (TIA), and cerebral infarction without residual deficits: Secondary | ICD-10-CM

## 2021-02-21 DIAGNOSIS — R569 Unspecified convulsions: Secondary | ICD-10-CM | POA: Diagnosis not present

## 2021-02-21 DIAGNOSIS — Z0189 Encounter for other specified special examinations: Secondary | ICD-10-CM | POA: Diagnosis not present

## 2021-02-21 MED ORDER — LEVETIRACETAM 500 MG PO TABS: 500.0000 mg | ORAL_TABLET | Freq: Two times a day (BID) | ORAL | 3 refills | Status: DC

## 2021-02-21 MED ORDER — PHENYTOIN SODIUM EXTENDED 100 MG PO CAPS: 300.0000 mg | ORAL_CAPSULE | Freq: Every day | ORAL | 3 refills | Status: DC

## 2021-02-21 NOTE — Patient Instructions (Signed)
Continue Keppra and Dilantin for seizure prevention  We will check lab work today    Thank you for coming to see [place] at [facility]. I hope we have been able to provide you high quality care today.  You may receive a patient satisfaction survey over the next few weeks. We would appreciate your feedback and comments so that we may continue to improve ourselves and the health of our patients.

## 2021-02-21 NOTE — Progress Notes (Signed)
I agree with the above plan 

## 2021-02-24 LAB — COMPREHENSIVE METABOLIC PANEL
ALT: 10 IU/L (ref 0–44)
AST: 14 IU/L (ref 0–40)
Albumin/Globulin Ratio: 1.9 (ref 1.2–2.2)
Albumin: 4.1 g/dL (ref 3.7–4.7)
Alkaline Phosphatase: 97 IU/L (ref 44–121)
BUN/Creatinine Ratio: 5 — ABNORMAL LOW (ref 10–24)
BUN: 5 mg/dL — ABNORMAL LOW (ref 8–27)
Bilirubin Total: 0.2 mg/dL (ref 0.0–1.2)
CO2: 25 mmol/L (ref 20–29)
Calcium: 8.7 mg/dL (ref 8.6–10.2)
Chloride: 101 mmol/L (ref 96–106)
Creatinine, Ser: 1.02 mg/dL (ref 0.76–1.27)
Globulin, Total: 2.2 g/dL (ref 1.5–4.5)
Glucose: 104 mg/dL — ABNORMAL HIGH (ref 65–99)
Potassium: 4.9 mmol/L (ref 3.5–5.2)
Sodium: 137 mmol/L (ref 134–144)
Total Protein: 6.3 g/dL (ref 6.0–8.5)
eGFR: 77 mL/min/{1.73_m2} (ref >59)

## 2021-02-24 LAB — VITAMIN D 25 HYDROXY (VIT D DEFICIENCY, FRACTURES): Vit D, 25-Hydroxy: 29.7 ng/mL — ABNORMAL LOW (ref 30.0–100.0)

## 2021-02-24 LAB — LIPID PANEL
Chol/HDL Ratio: 2.2 ratio (ref 0.0–5.0)
Cholesterol, Total: 149 mg/dL (ref 100–199)
HDL: 67 mg/dL (ref >39)
LDL Chol Calc (NIH): 69 mg/dL (ref 0–99)
Triglycerides: 62 mg/dL (ref 0–149)
VLDL Cholesterol Cal: 13 mg/dL (ref 5–40)

## 2021-02-24 LAB — CBC
Hematocrit: 35.9 % — ABNORMAL LOW (ref 37.5–51.0)
Hemoglobin: 12.4 g/dL — ABNORMAL LOW (ref 13.0–17.7)
MCH: 33.1 pg — ABNORMAL HIGH (ref 26.6–33.0)
MCHC: 34.5 g/dL (ref 31.5–35.7)
MCV: 96 fL (ref 79–97)
Platelets: 189 10*3/uL (ref 150–450)
RBC: 3.75 x10E6/uL — ABNORMAL LOW (ref 4.14–5.80)
RDW: 11.5 % — ABNORMAL LOW (ref 11.6–15.4)
WBC: 4.4 10*3/uL (ref 3.4–10.8)

## 2021-02-24 LAB — PHENYTOIN LEVEL, TOTAL: Phenytoin (Dilantin), Serum: 8.5 ug/mL — ABNORMAL LOW (ref 10.0–20.0)

## 2021-02-27 ENCOUNTER — Telehealth: Payer: Self-pay

## 2021-02-27 NOTE — Telephone Encounter (Signed)
-----   Message from Frann Rider, NP sent at 02/27/2021  1:41 PM EDT ----- Advised patient that recent lab work showed satisfactory Dilantin levels as well as cholesterol levels.  Vitamin D level slightly on the low side of normal and can follow-up with PCP for need of treatment vs monitoring.  All other labs stable compared to his baseline.

## 2021-02-27 NOTE — Telephone Encounter (Signed)
Contacted pt to inform him of labs, stated lab work showed satisfactory Dilantin levels as well as cholesterol levels.  Vitamin D level slightly on the low side of normal and can follow-up with PCP for need of treatment vs monitoring.  All other labs stable compared to hisbaseline. He was appreciative, advised to call us with questions as he had none at the time .

## 2021-03-08 ENCOUNTER — Ambulatory Visit (HOSPITAL_COMMUNITY)
Admission: RE | Admit: 2021-03-08 | Discharge: 2021-03-08 | Disposition: A | Discharge status: Home / Self Care | Payer: Medicare HMO | Source: Ambulatory Visit | Attending: Cardiology | Admitting: Cardiology

## 2021-03-08 ENCOUNTER — Other Ambulatory Visit: Payer: Self-pay

## 2021-03-08 DIAGNOSIS — I6523 Occlusion and stenosis of bilateral carotid arteries: Secondary | ICD-10-CM | POA: Insufficient documentation

## 2021-03-10 ENCOUNTER — Other Ambulatory Visit: Payer: Self-pay

## 2021-03-10 ENCOUNTER — Ambulatory Visit (INDEPENDENT_AMBULATORY_CARE_PROVIDER_SITE_OTHER): Payer: Medicare HMO

## 2021-03-10 DIAGNOSIS — Z5181 Encounter for therapeutic drug level monitoring: Secondary | ICD-10-CM

## 2021-03-10 DIAGNOSIS — I4891 Unspecified atrial fibrillation: Secondary | ICD-10-CM | POA: Diagnosis not present

## 2021-03-10 LAB — POCT INR: INR: 2.5 (ref 2.0–3.0)

## 2021-03-10 NOTE — Patient Instructions (Signed)
Description   Continue to take 1/2 a tablet daily except for 1 tablet on Tuesdays and Saturdays.  Recheck INR in 4 weeks. Coumadin Clinic [phone].

## 2021-03-11 ENCOUNTER — Other Ambulatory Visit: Payer: Self-pay | Admitting: Adult Health

## 2021-03-13 NOTE — Telephone Encounter (Signed)
Pt need an a ppt for further refills.

## 2021-03-15 ENCOUNTER — Other Ambulatory Visit: Payer: Self-pay | Admitting: Cardiology

## 2021-03-15 ENCOUNTER — Telehealth: Payer: Self-pay | Admitting: Student

## 2021-03-15 MED ORDER — SPIRIVA HANDIHALER 18 MCG IN CAPS: ORAL_CAPSULE | RESPIRATORY_TRACT | 0 refills | Status: DC

## 2021-03-15 NOTE — Telephone Encounter (Signed)
Pt stated that he was informed by his pharmacy that he is not able to get his Spiriva refilled until he sees his pulmonologist pt last saw NP Volanda Napoleon in 2020 and the instructions were to establish as a new patient with any doctors being he was a former Dr. Ashok Cordia pt. I did schedule the pt for a new patient appointment with Dr. Verlee Monte on 04/14/2021 and he was wanting to know if something can be given to him until then.  Pharmacy; [facility]; [address], [place], [place] [address]  Pls regard; [phone]

## 2021-03-15 NOTE — Telephone Encounter (Signed)
I have sent the rx to the pharmacy for the pt for 1 month.  He must keep his appt that is scheduled for next month.

## 2021-03-15 NOTE — Telephone Encounter (Signed)
ATC patient answered but nothing said.  ATC back ringing busy.  Patient can have one month supply but nothing more until he is seen in office.

## 2021-03-27 ENCOUNTER — Other Ambulatory Visit: Payer: Self-pay | Admitting: Adult Health

## 2021-03-27 DIAGNOSIS — Z760 Encounter for issue of repeat prescription: Secondary | ICD-10-CM

## 2021-04-07 ENCOUNTER — Ambulatory Visit (INDEPENDENT_AMBULATORY_CARE_PROVIDER_SITE_OTHER): Payer: Medicare HMO

## 2021-04-07 ENCOUNTER — Other Ambulatory Visit: Payer: Self-pay

## 2021-04-07 DIAGNOSIS — I4891 Unspecified atrial fibrillation: Secondary | ICD-10-CM

## 2021-04-07 DIAGNOSIS — Z5181 Encounter for therapeutic drug level monitoring: Secondary | ICD-10-CM

## 2021-04-07 LAB — POCT INR: INR: 4.9 — AB (ref 2.0–3.0)

## 2021-04-07 NOTE — Patient Instructions (Addendum)
Description   Hold today's dose and tomorrow's dose and then continue to take 1/2 a tablet daily except for 1 tablet on Tuesdays and Saturdays. Recheck INR in 2 weeks. Coumadin Clinic [phone].

## 2021-04-13 NOTE — Progress Notes (Signed)
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  His DOE is stable. He has had no exacerbations requiring hospitalization or steroids. He says he is fully vaccinated for covid-19, pneumonia vaccination up to date. Takes spiriva daily and thinks it's helpful for his dyspnea.    Otherwise pertinent review of systems is negative.  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   Recent fall 2012    Esophageal reflux    Generalized osteoarthrosis, unspecified site    History of tobacco abuse    Hypertension    HYPERTENSION, BENIGN 10/20/2008   Qualifier: Diagnosis of  By: Owens Shark, RN, BSN, Lauren     Hyponatremia    Insomnia    Iron deficiency anemia, unspecified    ISCHEMIC COLITIS, HX OF 10/20/2008   Qualifier: Diagnosis of  By: Owens Shark, RN, BSN, Lauren     Lung nodules 2008, 2014   LLL nodule removed 2008 with LLL lobectomy,  RLL nodule 10m observation   Prostate cancer ([place] 2015   Protruding sternal wires    S/P mitral valve replacement 02/01/1998   Carpentier-Edwards porcine bioprosthetic tissue valve, size 364mPlaced for acute and subacute bacterial endocarditis (group G Streptococcus) with pre-existing mitral valve prolapse    S/P mitral valve replacement with bioprosthetic valve  03/24/2013   Redo mitral valve replacement using [id number] Magna mitral bovine bioprosthetic tissue valve performed via right mini thoracotomy   [place]  last seizure 1 year ago   Severe mitral regurgitation 02/20/2013   Unsteady gait      Family History  Problem Relation Age of Onset   Cancer Father        prostate cancer   Tuberculosis Father    Heart attack Father    Cancer Brother        prostate cancer   Arthritis Mother    Lung disease Neg Hx      Past Surgical History:  Procedure Laterality Date   CARDIAC VALVE REPLACEMENT     INTRAOPERATIVE TRANSESOPHAGEAL ECHOCARDIOGRAM N/A 03/24/2013   Procedure: INTRAOPERATIVE TRANSESOPHAGEAL ECHOCARDIOGRAM;  Surgeon: ClaRexene AlbertsD;  [facility];  Laterality: N/A;   MINIMALLY INVASIVE MAZE PROCEDURE N/A 03/24/2013   Procedure: MINIMALLY INVASIVE MAZE PROCEDURE;  Surgeon: ClaRexene AlbertsD;  [facility];  Laterality: N/A;   MITRAL VALVE REPLACEMENT  02/01/1998   Carpentier-Edwards porcine bioprosthetic tissue valve, size 66m70mlaced for complicated bacterial endocarditis   MITRAL VALVE REPLACEMENT Right 03/24/2013   Procedure: MINIMALLY INVASIVE REDO MITRAL VALVE (MV) REPLACEMENT;  Surgeon: ClarRexene Alberts;  [facility];  Laterality: Right;   PROSTATE BIOPSY     radiation  treatment tx for prostate cancer  2015   STERNAL WIRES REMOVAL N/A 07/10/2018   Procedure: STERNAL WIRES REMOVAL;  Surgeon: Rexene Alberts, MD;  Location: [facility];  Service: Thoracic;  Laterality: N/A;   TEE WITHOUT CARDIOVERSION N/A 02/12/2013   Procedure: TRANSESOPHAGEAL ECHOCARDIOGRAM (TEE);  Surgeon: Fay Records, MD;  [facility] ENDOSCOPY;  Service: Cardiovascular;  Laterality: N/A;   VIDEO ASSISTED THORACOSCOPY  04/29/2007   Left VATS w/ mini thoracotomy for Left Lower Lobectomy for benign lung nodules    Social History   Socioeconomic  History   Marital status: Divorced    Spouse name: Not on file   Number of children: 2   Years of education: 9   Highest education level: Not on file  Occupational History   Occupation: disabled  Tobacco Use   Smoking status: Former    Packs/day: 2.00    Years: 45.00    Pack years: 90.00    Types: Cigarettes    Quit date: 07/30/2006    Years since quitting: 14.7   Smokeless tobacco: Never  Vaping Use   Vaping Use: Never used  Substance and Sexual Activity   Alcohol use: No    Alcohol/week: 0.0 standard drinks    Comment: previous alcohol use & home-made alcohol consumption   Drug use: No   Sexual activity: Not Currently  Other Topics Concern   Not on file  Social History Narrative   Patient is divorced and has 2 children.   Patient is right handed.   Patient has 9 th grade education.    Patient drinks diet sodas.      [facility]:   From [place] originally. Always lived in [place]. 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 Previously traveled to [place], [place], & [place]. No pets currently. Remote pet owl and parakeet exposure. No mold exposure. Enjoys fishing.    Social Determinants of Health   Financial Resource Strain: Low Risk    Difficulty of Paying Living Expenses: Not hard at all  Food Insecurity: No Food Insecurity   Worried About Charity fundraiser in the Last Year: Never true   [place] in the Last Year: Never true  Transportation Needs: No Transportation Needs   Lack of Transportation (Medical): No   Lack of Transportation (Non-Medical): No  Physical Activity: Insufficiently Active   Days of Exercise per Week: 7 days   Minutes of Exercise per Session: 20 min  Stress: No Stress Concern Present   Feeling of Stress : Not at all  Social Connections: Socially Isolated   Frequency of Communication with Friends and Family: More than three times a week   Frequency of Social Gatherings with Friends and Family: More than three times a week    Attends Religious Services: Never   Marine scientist or Organizations: No   Attends Music therapist: Never   Marital Status: Divorced  Human resources officer Violence: Not At Risk   Fear of Current or Ex-Partner: No   Emotionally Abused: No   Physically Abused: No   Sexually Abused: No     Allergies  Allergen Reactions   Ace Inhibitors Swelling   Lisinopril     Seizures    Rosuvastatin Other (See Comments)    Muscle aches     Outpatient Medications Prior to Visit  Medication Sig Dispense Refill   ezetimibe (ZETIA) 10 MG tablet TAKE 1 TABLET EVERY DAY 90 tablet 3   levETIRAcetam (KEPPRA) 500  MG tablet Take 1 tablet (500 mg total) by mouth 2 (two) times daily. Must keep upcoming appt for further refills 180 tablet 3   metoprolol tartrate (LOPRESSOR) 25 MG tablet Take 0.5 tablets (12.5 mg total) by mouth 2 (two) times daily. 90 tablet 3   phenytoin (DILANTIN) 100 MG ER capsule Take 3 capsules (300 mg total) by mouth at bedtime. 270 capsule 3   tiotropium (SPIRIVA HANDIHALER) 18 MCG inhalation capsule INHALE THE CONTENTS OF 1 CAPSULE EVERY DAY VIA HANDIHALER ( NEED MD APPOINTMENT FOR REFILLS ) 30 capsule 0   tiZANidine (ZANAFLEX) 4 MG tablet TAKE 1 TABLET EVERY 6 HOURS AS NEEDED FOR MUSCLE SPASMS 360 tablet 1   traMADol (ULTRAM) 50 MG tablet TAKE 1 TABLET EVERY 8 HOURS AS NEEDED 90 tablet 2   warfarin (COUMADIN) 6 MG tablet TAKE AS DIRECTED BY COUMADIN CLINIC 70 tablet 1   No facility-administered medications prior to visit.       Objective:   Physical Exam:  General appearance: chronically ill appearing 75 y.o., male, NAD, conversant  Eyes: anicteric sclerae, moist conjunctivae; no lid-lag; PERRL, tracking appropriately HENT: NCAT; oropharynx, MMM, no mucosal ulcerations; normal hard and soft palate Neck: Trachea midline; no lymphadenopathy, no JVD Lungs: kyphotic, diminished b/l, no crackles, no wheeze, with normal respiratory effort CV: RRR, no MRGs   Abdomen: Soft, non-tender; non-distended, BS present  Extremities: No peripheral edema, radial and DP pulses present bilaterally  Skin: Normal temperature, turgor and texture; no rash Psych: Appropriate affect Neuro: Alert and oriented to person and place, no focal deficit    Vitals:   04/14/21 0953  BP: 128/78  Pulse: 99  Temp: 98 F (36.7 C)  TempSrc: Oral  SpO2: 93%  Weight: 136 lb 9.6 oz (62 kg)  Height: '5\' 6"'$  (1.676 m)   93% on RA BMI Readings from Last 3 Encounters:  04/14/21 22.05 kg/m  02/21/21 24.27 kg/m  11/03/20 25.15 kg/m   Wt Readings from Last 3 Encounters:  04/14/21 136 lb 9.6 oz (62 kg)  02/21/21 137 lb (62.1 kg)  11/03/20 142 lb (64.4 kg)     CBC    Component Value Date/Time   WBC 4.4 02/21/2021 0000   WBC 5.3 02/26/2020 0817   RBC 3.75 (L) 02/21/2021 0000   RBC 3.83 (L) 02/26/2020 0817   HGB 12.4 (L) 02/21/2021 0000   HCT 35.9 (L) 02/21/2021 0000   PLT 189 02/21/2021 0000   MCV 96 02/21/2021 0000   MCH 33.1 (H) 02/21/2021 0000   MCH 33.7 (H) 02/26/2020 0817   MCHC 34.5 02/21/2021 0000   MCHC 34.9 02/26/2020 0817   RDW 11.5 (L) 02/21/2021 0000   LYMPHSABS 986 02/26/2020 0817   LYMPHSABS 0.6 (L) 10/22/2017 1050   MONOABS 0.8 02/06/2019 1031   EOSABS 170 02/26/2020 0817   EOSABS 0.1 10/22/2017 1050   BASOSABS 32 02/26/2020 0817   BASOSABS 0.0 10/22/2017 1050    No remarkable eosinophilia  Chest Imaging:  CT Chest 2019 reviewed by me and remarkable for increased AP diameter, upper lobe predominant emphysema, s/p LL lobectomy, patulous esophagus, huge left atrium  Pulmonary Functions Testing Results: PFT Results Latest Ref Rng & Units 11/06/2016  FVC-Pre L 2.57  FVC-Predicted Pre % 71  FVC-Post L 2.62  FVC-Predicted Post % 72  Pre FEV1/FVC % % 59  Post FEV1/FCV % % 61  FEV1-Pre L 1.52  FEV1-Predicted Pre % 58  FEV1-Post L 1.60  DLCO uncorrected ml/min/mmHg 9.07  DLCO UNC% % 35  DLCO corrected ml/min/mmHg 9.81  DLCO COR  %Predicted % 38  DLVA Predicted % 64   > 11/06/2016: FVC 72%, FEV1 61%, ratio 61, no significant bronchodilatory response, DLCO 35%.  > 6MWT 08/29/2015- No oxygen desaturation  > Chest CT 2017- stable 60m right middle lobe nodule unchanged from 2014  > 03/03/13: FVC 2.85 L (76%) FEV1 1.78 L (64%) FEV1/FVC 0.63 no bronchodilator response TLC 5.50 L (91%) RV 110% (unable to obtain DLCO)     Path:   2008: 1. LUNG, LEFT LOWER LOBE, RESECTION:   - BENIGN SUBPLEURAL AND INTRAPARENCHYMAL LYMPH NODES.   - BENIGN LUNG PARENCHYMA.   - NO GRANULOMAS OR NEOPLASM IDENTIFIED.   - BRONCHIAL AND VASCULAR MARGINS NEGATIVE FOR TUMOR.    2. LYMPH NODE, 11L, BIOPSY: BENIGN LYMPH NODE.    3. LYMPH NODE, LEVEL 3, BIOPSY: BENIGN LYMPH NODE   Echocardiogram:   02/2018: - Normal LV systolic function; trace AI; mildly dilated aortic    root; s/p MVR with normal mean gradient of 3 mmHg; severe LAE;    mild TR with moderate pulmonary hypertension (RVSP of 48)     Assessment & Plan:   # COPD gold functional group B:  # S/p remote LL lobectomy:  # Remote smoking:  Plan: - will hold off on PFTs, his symptoms and COPD functional status are stable - refilled spiriva - declines pulmonary rehab - lung cancer screening referral placed today, quit in 2008 so he's right on the edge of qualifying - he will get a flu shot with his PCP     NMaryjane Hurter MD [facility] 04/14/2021 10:13 AM

## 2021-04-14 ENCOUNTER — Ambulatory Visit (INDEPENDENT_AMBULATORY_CARE_PROVIDER_SITE_OTHER): Payer: Medicare HMO | Admitting: Student

## 2021-04-14 ENCOUNTER — Other Ambulatory Visit: Payer: Self-pay

## 2021-04-14 ENCOUNTER — Encounter: Payer: Self-pay | Admitting: Student

## 2021-04-14 VITALS — BP 128/78 | HR 99 | Temp 98.0°F | Ht 66.0 in | Wt 136.6 lb

## 2021-04-14 DIAGNOSIS — F17200 Nicotine dependence, unspecified, uncomplicated: Secondary | ICD-10-CM | POA: Diagnosis not present

## 2021-04-14 MED ORDER — SPIRIVA HANDIHALER 18 MCG IN CAPS: ORAL_CAPSULE | RESPIRATORY_TRACT | 11 refills | Status: DC

## 2021-04-14 NOTE — Patient Instructions (Signed)
-   refilled spiriva - referred to lung cancer screening program you will be called to schedule appointment to discuss pros and cons of lung cancer screening to decrease your chance of dying from lung cancer.

## 2021-04-20 ENCOUNTER — Ambulatory Visit (INDEPENDENT_AMBULATORY_CARE_PROVIDER_SITE_OTHER): Payer: Medicare HMO | Admitting: *Deleted

## 2021-04-20 ENCOUNTER — Other Ambulatory Visit: Payer: Self-pay

## 2021-04-20 DIAGNOSIS — I4891 Unspecified atrial fibrillation: Secondary | ICD-10-CM

## 2021-04-20 DIAGNOSIS — Z5181 Encounter for therapeutic drug level monitoring: Secondary | ICD-10-CM

## 2021-04-20 LAB — POCT INR: INR: 2.9 (ref 2.0–3.0)

## 2021-04-20 NOTE — Patient Instructions (Signed)
Description   Continue taking Warfarin 1/2 tablet daily except for 1 tablet on Tuesdays and Saturdays. Recheck INR in 4 weeks. [facility] [phone].

## 2021-04-21 DIAGNOSIS — N3943 Post-void dribbling: Secondary | ICD-10-CM | POA: Diagnosis not present

## 2021-04-21 DIAGNOSIS — Z8546 Personal history of malignant neoplasm of prostate: Secondary | ICD-10-CM | POA: Diagnosis not present

## 2021-04-27 ENCOUNTER — Other Ambulatory Visit: Payer: Self-pay | Admitting: Adult Health

## 2021-04-27 DIAGNOSIS — Z760 Encounter for issue of repeat prescription: Secondary | ICD-10-CM

## 2021-04-27 NOTE — Telephone Encounter (Signed)
Pt needs an appt for refill.

## 2021-05-04 ENCOUNTER — Telehealth: Payer: Self-pay | Admitting: Adult Health

## 2021-05-04 NOTE — Telephone Encounter (Signed)
GTA Access [place] to be filled out--placed in dr's folder.    Mail to: [place], [place] [address]

## 2021-05-05 NOTE — Telephone Encounter (Signed)
Pt has been scheduled for CPE.

## 2021-05-15 ENCOUNTER — Other Ambulatory Visit: Payer: Self-pay | Admitting: Adult Health

## 2021-05-16 ENCOUNTER — Telehealth: Payer: Self-pay | Admitting: Adult Health

## 2021-05-16 NOTE — Telephone Encounter (Signed)
Spoke to pt sister Altha Harm and advised that PPW would be filled out during CPE. Altha Harm stated that PPW needed to be completed by 05/28/2021. I advised Altha Harm that pt scheduled CPE for Nov.3rd and may not have known the deadline. Pt has been scheduled for an earlier time. Altha Harm will notify pt of appt. And call back if any conflict in scheduling.

## 2021-05-16 NOTE — Telephone Encounter (Signed)
Scat paperwork was dropped off but [facility] says they never received it.  Patient's sister is requesting a call back.  Patient will be out of a ride as of October 31st.

## 2021-05-17 ENCOUNTER — Other Ambulatory Visit (HOSPITAL_BASED_OUTPATIENT_CLINIC_OR_DEPARTMENT_OTHER): Payer: Self-pay | Admitting: Cardiology

## 2021-05-17 DIAGNOSIS — I779 Disorder of arteries and arterioles, unspecified: Secondary | ICD-10-CM

## 2021-05-18 ENCOUNTER — Ambulatory Visit (INDEPENDENT_AMBULATORY_CARE_PROVIDER_SITE_OTHER): Payer: Medicare HMO

## 2021-05-18 ENCOUNTER — Other Ambulatory Visit: Payer: Self-pay

## 2021-05-18 DIAGNOSIS — I4891 Unspecified atrial fibrillation: Secondary | ICD-10-CM | POA: Diagnosis not present

## 2021-05-18 DIAGNOSIS — Z5181 Encounter for therapeutic drug level monitoring: Secondary | ICD-10-CM | POA: Diagnosis not present

## 2021-05-18 LAB — POCT INR: INR: 3.1 — AB (ref 2.0–3.0)

## 2021-05-18 NOTE — Patient Instructions (Signed)
Description   Continue taking Warfarin 1/2 tablet daily except for 1 tablet on Tuesdays and Saturdays. Recheck INR in 5 weeks. [facility] [phone].

## 2021-05-20 ENCOUNTER — Other Ambulatory Visit: Payer: Self-pay | Admitting: Adult Health

## 2021-05-20 DIAGNOSIS — Z760 Encounter for issue of repeat prescription: Secondary | ICD-10-CM

## 2021-05-22 ENCOUNTER — Other Ambulatory Visit: Payer: Self-pay

## 2021-05-22 NOTE — Telephone Encounter (Signed)
Will wait for appt

## 2021-05-23 ENCOUNTER — Ambulatory Visit (INDEPENDENT_AMBULATORY_CARE_PROVIDER_SITE_OTHER): Payer: Medicare HMO | Admitting: Adult Health

## 2021-05-23 ENCOUNTER — Encounter: Payer: Self-pay | Admitting: Adult Health

## 2021-05-23 VITALS — BP 135/70 | HR 114 | Temp 98.7°F | Ht 64.0 in | Wt 136.0 lb

## 2021-05-23 DIAGNOSIS — E782 Mixed hyperlipidemia: Secondary | ICD-10-CM

## 2021-05-23 DIAGNOSIS — Z8546 Personal history of malignant neoplasm of prostate: Secondary | ICD-10-CM

## 2021-05-23 DIAGNOSIS — J439 Emphysema, unspecified: Secondary | ICD-10-CM | POA: Diagnosis not present

## 2021-05-23 DIAGNOSIS — I4891 Unspecified atrial fibrillation: Secondary | ICD-10-CM | POA: Diagnosis not present

## 2021-05-23 DIAGNOSIS — Z952 Presence of prosthetic heart valve: Secondary | ICD-10-CM

## 2021-05-23 DIAGNOSIS — J438 Other emphysema: Secondary | ICD-10-CM | POA: Diagnosis not present

## 2021-05-23 DIAGNOSIS — I10 Essential (primary) hypertension: Secondary | ICD-10-CM

## 2021-05-23 DIAGNOSIS — Z0000 Encounter for general adult medical examination without abnormal findings: Secondary | ICD-10-CM

## 2021-05-23 DIAGNOSIS — G8929 Other chronic pain: Secondary | ICD-10-CM

## 2021-05-23 DIAGNOSIS — R569 Unspecified convulsions: Secondary | ICD-10-CM | POA: Diagnosis not present

## 2021-05-23 DIAGNOSIS — I509 Heart failure, unspecified: Secondary | ICD-10-CM | POA: Diagnosis not present

## 2021-05-23 DIAGNOSIS — M545 Low back pain, unspecified: Secondary | ICD-10-CM

## 2021-05-23 DIAGNOSIS — I110 Hypertensive heart disease with heart failure: Secondary | ICD-10-CM | POA: Diagnosis not present

## 2021-05-23 DIAGNOSIS — Z23 Encounter for immunization: Secondary | ICD-10-CM | POA: Diagnosis not present

## 2021-05-23 LAB — CBC WITH DIFFERENTIAL/PLATELET
Basophils Absolute: 0.1 10*3/uL (ref 0.0–0.1)
Basophils Relative: 1 % (ref 0.0–3.0)
Eosinophils Absolute: 0.1 10*3/uL (ref 0.0–0.7)
Eosinophils Relative: 2.3 % (ref 0.0–5.0)
HCT: 40.2 % (ref 39.0–52.0)
Hemoglobin: 13.2 g/dL (ref 13.0–17.0)
Lymphocytes Relative: 11.1 % — ABNORMAL LOW (ref 12.0–46.0)
Lymphs Abs: 0.6 10*3/uL — ABNORMAL LOW (ref 0.7–4.0)
MCHC: 32.8 g/dL (ref 30.0–36.0)
MCV: 97.5 fl (ref 78.0–100.0)
Monocytes Absolute: 0.7 10*3/uL (ref 0.1–1.0)
Monocytes Relative: 13 % — ABNORMAL HIGH (ref 3.0–12.0)
Neutro Abs: 4 10*3/uL (ref 1.4–7.7)
Neutrophils Relative %: 72.6 % (ref 43.0–77.0)
Platelets: 193 10*3/uL (ref 150.0–400.0)
RBC: 4.13 Mil/uL — ABNORMAL LOW (ref 4.22–5.81)
RDW: 13.7 % (ref 11.5–15.5)
WBC: 5.5 10*3/uL (ref 4.0–10.5)

## 2021-05-23 LAB — LIPID PANEL
Cholesterol: 144 mg/dL (ref 0–200)
HDL: 63.1 mg/dL (ref >39.00)
LDL Cholesterol: 60 mg/dL (ref 0–99)
NonHDL: 80.49
Total CHOL/HDL Ratio: 2
Triglycerides: 101 mg/dL (ref 0.0–149.0)
VLDL: 20.2 mg/dL (ref 0.0–40.0)

## 2021-05-23 LAB — TSH: TSH: 1.21 u[IU]/mL (ref 0.35–5.50)

## 2021-05-23 LAB — COMPREHENSIVE METABOLIC PANEL
ALT: 12 U/L (ref 0–53)
AST: 16 U/L (ref 0–37)
Albumin: 4.3 g/dL (ref 3.5–5.2)
Alkaline Phosphatase: 92 U/L (ref 39–117)
BUN: 7 mg/dL (ref 6–23)
CO2: 30 mEq/L (ref 19–32)
Calcium: 9.4 mg/dL (ref 8.4–10.5)
Chloride: 100 mEq/L (ref 96–112)
Creatinine, Ser: 1.1 mg/dL (ref 0.40–1.50)
GFR: 65.77 mL/min (ref >60.00)
Glucose, Bld: 99 mg/dL (ref 70–99)
Potassium: 4.1 mEq/L (ref 3.5–5.1)
Sodium: 138 mEq/L (ref 135–145)
Total Bilirubin: 0.6 mg/dL (ref 0.2–1.2)
Total Protein: 6.4 g/dL (ref 6.0–8.3)

## 2021-05-23 LAB — PSA: PSA: 0 ng/mL — ABNORMAL LOW (ref 0.10–4.00)

## 2021-05-23 NOTE — Patient Instructions (Signed)
It was great seeing you today   We will follow up with you regarding your blood work   Please follow up in one year for your next physical

## 2021-05-23 NOTE — Telephone Encounter (Signed)
Pt arrived today for CPE. PPW has been faxed. Called pt sister to advise but no answer.

## 2021-05-23 NOTE — Progress Notes (Signed)
Subjective:    Patient ID: Jeffery Copeland, male    DOB: 11-17-1945, 75 y.o.   MRN: [id number]  HPI Patient presents for yearly preventative medicine examination. He is a pleasant 75 year old male who  has a past medical history of Acute and subacute bacterial endocarditis (1999), Atrial fibrillation ([place]) (11/11/2008), CARDIOMYOPATHY (10/20/2008), Cerebrovascular disease, unspecified, Closed right acetabular fracture ([place]), COPD (chronic obstructive pulmonary disease) ([place]) (1999), Epidural hematoma (03/01/2011), Esophageal reflux, Generalized osteoarthrosis, unspecified site, History of tobacco abuse, Hypertension, HYPERTENSION, BENIGN (10/20/2008), Hyponatremia, Insomnia, Iron deficiency anemia, unspecified, ISCHEMIC COLITIS, HX OF (10/20/2008), Lung nodules (2008, 2014), Prostate cancer ([place]) (2015), Protruding sternal wires, S/P mitral valve replacement (02/01/1998), S/P mitral valve replacement with bioprosthetic valve (03/24/2013), Seizures ([place]), Severe mitral regurgitation (02/20/2013), and Unsteady gait.  COPD-managed by pulmonary.  Currently uses Spiriva inhaler daily.  He does feel well controlled.  Continues to have episodes of shortness of breath with exertion  Epilepsy-managed by neurology with Keppra 500 mg twice daily and Dilantin 300 mg daily.  Denies any recent seizure-like activity  A. fib/mitral valve replacement-  mitral valve replacement in 1999 with redo valve in 2014.  Is on Coumadin and statin therapy.  Rate controlled with metoprolol 12.5 mg BID.  Denies chest pain or palpitations  Hyperlipidemia -takes Zetia 10 mg daily.  He denies myalgia or fatigue Lab Results  Component Value Date   CHOL 149 02/21/2021   HDL 67 02/21/2021   LDLCALC 69 02/21/2021   TRIG 62 02/21/2021   CHOLHDL 2.2 02/21/2021   Essential hypertension-is prescribed metoprolol 12.5 mg twice daily.  He denies chest pain, headaches, blurred vision, lightheadedness, or syncopal episodes BP Readings from Last 3  Encounters:  05/23/21 135/70  04/14/21 128/78  02/21/21 124/62   Chronic back pain-does well with tramadol 50 mg twice daily and Zanaflex 4 mg Q6H PRN   History of Prostate Cancer-is followed by urology on a yearly basis.  He was diagnosed with prostate cancer on 02/20/2015.  Treated with external beam radiation.  He did not receive hormonal therapy around his radiation.  Has not had any recurrence of his prostate cancer.  Does have issues with urinary incontinence from time to time. Was last seen by urology on 04/21/2021  All immunizations and health maintenance protocols were reviewed with the patient and needed orders were placed.  Appropriate screening laboratory values were ordered for the patient including screening of hyperlipidemia, renal function and hepatic function. If indicated by BPH, a PSA was ordered.  Medication reconciliation,  past medical history, social history, problem list and allergies were reviewed in detail with the patient  Goals were established with regard to weight loss, exercise, and  diet in compliance with medications Wt Readings from Last 3 Encounters:  05/23/21 136 lb (61.7 kg)  04/14/21 136 lb 9.6 oz (62 kg)  02/21/21 137 lb (62.1 kg)   Denies any acute issues.   Review of Systems  Constitutional: Negative.   HENT:  Positive for hearing loss.   Eyes: Negative.   Respiratory:  Positive for shortness of breath (baseline).   Cardiovascular: Negative.   Gastrointestinal: Negative.   Endocrine: Negative.   Genitourinary: Negative.   Musculoskeletal:  Positive for arthralgias and back pain.  Skin: Negative.   Allergic/Immunologic: Negative.   Neurological: Negative.   Hematological: Negative.   Psychiatric/Behavioral: Negative.    All other systems reviewed and are negative.  Past Medical History:  Diagnosis Date   Acute and subacute bacterial endocarditis 1999  group G Streptococcus   Atrial fibrillation ([place]) 11/11/2008   Qualifier: Diagnosis  of  By: Lia Foyer, MD, Jaquelyn Bitter    CARDIOMYOPATHY 10/20/2008   Qualifier: Diagnosis of  By: Owens Shark, RN, BSN, Lauren     Cerebrovascular disease, unspecified    Closed right acetabular fracture ([place])    COPD (chronic obstructive pulmonary disease) ([place]) 1999   Epidural hematoma 03/01/2011   Recent fall 2012    Esophageal reflux    Generalized osteoarthrosis, unspecified site    History of tobacco abuse    Hypertension    HYPERTENSION, BENIGN 10/20/2008   Qualifier: Diagnosis of  By: Owens Shark, RN, BSN, Lauren     Hyponatremia    Insomnia    Iron deficiency anemia, unspecified    ISCHEMIC COLITIS, HX OF 10/20/2008   Qualifier: Diagnosis of  By: Owens Shark, RN, BSN, Lauren     Lung nodules 2008, 2014   LLL nodule removed 2008 with LLL lobectomy,  RLL nodule 40mm observation   Prostate cancer ([place]) 2015   Protruding sternal wires    S/P mitral valve replacement 02/01/1998   Carpentier-Edwards porcine bioprosthetic tissue valve, size 7mm Placed for acute and subacute bacterial endocarditis (group G Streptococcus) with pre-existing mitral valve prolapse    S/P mitral valve replacement with bioprosthetic valve 03/24/2013   Redo mitral valve replacement using 60mm Edwards Magna mitral bovine bioprosthetic tissue valve performed via right mini thoracotomy   Seizures ([place])    last seizure 1 year ago   Severe mitral regurgitation 02/20/2013   Unsteady gait     Social History   Socioeconomic History   Marital status: Divorced    Spouse name: Not on file   Number of children: 2   Years of education: 9   Highest education level: Not on file  Occupational History   Occupation: disabled  Tobacco Use   Smoking status: Former    Packs/day: 2.00    Years: 45.00    Pack years: 90.00    Types: Cigarettes    Quit date: 07/30/2006    Years since quitting: 14.8   Smokeless tobacco: Never  Vaping Use   Vaping Use: Never used  Substance and Sexual Activity   Alcohol use: No    Alcohol/week: 0.0  standard drinks    Comment: previous alcohol use & home-made alcohol consumption   Drug use: No   Sexual activity: Not Currently  Other Topics Concern   Not on file  Social History Narrative   Patient is divorced and has 2 children.   Patient is right handed.   Patient has 9 th grade education.    Patient drinks diet sodas.      [facility]:   From [place] originally. Always lived in [place]. 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 Previously traveled to [place], [place], & [place]. No pets currently. Remote pet owl and parakeet exposure. No mold exposure. Enjoys fishing.    Social Determinants of Health   Financial Resource Strain: Low Risk    Difficulty of Paying Living Expenses: Not hard at all  Food Insecurity: No Food Insecurity   Worried About Charity fundraiser in the Last Year: Never true   [place] in the Last Year: Never true  Transportation Needs: No Transportation Needs   Lack of Transportation (Medical): No   Lack of Transportation (Non-Medical): No  Physical Activity: Insufficiently Active   Days of Exercise per Week: 7 days   Minutes of Exercise per  Session: 20 min  Stress: No Stress Concern Present   Feeling of Stress : Not at all  Social Connections: Socially Isolated   Frequency of Communication with Friends and Family: More than three times a week   Frequency of Social Gatherings with Friends and Family: More than three times a week   Attends Religious Services: Never   Marine scientist or Organizations: No   Attends Archivist Meetings: Never   Marital Status: Divorced  Human resources officer Violence: Not At Risk   Fear of Current or Ex-Partner: No   Emotionally Abused: No   Physically Abused: No   Sexually Abused: No    Past Surgical History:  Procedure Laterality Date   CARDIAC VALVE REPLACEMENT     INTRAOPERATIVE TRANSESOPHAGEAL ECHOCARDIOGRAM N/A 03/24/2013   Procedure: INTRAOPERATIVE TRANSESOPHAGEAL  ECHOCARDIOGRAM;  Surgeon: Rexene Alberts, MD;  Location: [facility];  Service: Open Heart Surgery;  Laterality: N/A;   MINIMALLY INVASIVE MAZE PROCEDURE N/A 03/24/2013   Procedure: MINIMALLY INVASIVE MAZE PROCEDURE;  Surgeon: Rexene Alberts, MD;  Location: [facility];  Service: Open Heart Surgery;  Laterality: N/A;   MITRAL VALVE REPLACEMENT  02/01/1998   Carpentier-Edwards porcine bioprosthetic tissue valve, size 15mm, placed for complicated bacterial endocarditis   MITRAL VALVE REPLACEMENT Right 03/24/2013   Procedure: MINIMALLY INVASIVE REDO MITRAL VALVE (MV) REPLACEMENT;  Surgeon: Rexene Alberts, MD;  Location: [facility];  Service: Open Heart Surgery;  Laterality: Right;   PROSTATE BIOPSY     radiation treatment tx for prostate cancer  2015   STERNAL WIRES REMOVAL N/A 07/10/2018   Procedure: STERNAL WIRES REMOVAL;  Surgeon: Rexene Alberts, MD;  Location: [facility];  Service: Thoracic;  Laterality: N/A;   TEE WITHOUT CARDIOVERSION N/A 02/12/2013   Procedure: TRANSESOPHAGEAL ECHOCARDIOGRAM (TEE);  Surgeon: Fay Records, MD;  Location: [facility] ENDOSCOPY;  Service: Cardiovascular;  Laterality: N/A;   VIDEO ASSISTED THORACOSCOPY  04/29/2007   Left VATS w/ mini thoracotomy for Left Lower Lobectomy for benign lung nodules    Family History  Problem Relation Age of Onset   Cancer Father        prostate cancer   Tuberculosis Father    Heart attack Father    Cancer Brother        prostate cancer   Arthritis Mother    Lung disease Neg Hx     Allergies  Allergen Reactions   Ace Inhibitors Swelling   Lisinopril     Seizures    Rosuvastatin Other (See Comments)    Muscle aches    Current Outpatient Medications on File Prior to Visit  Medication Sig Dispense Refill   ezetimibe (ZETIA) 10 MG tablet TAKE 1 TABLET EVERY DAY 90 tablet 3   levETIRAcetam (KEPPRA) 500 MG tablet Take 1 tablet (500 mg total) by mouth 2 (two) times daily. Must keep upcoming appt for further refills 180 tablet 3   metoprolol  tartrate (LOPRESSOR) 25 MG tablet TAKE 1/2 TABLET TWICE DAILY 90 tablet 3   phenytoin (DILANTIN) 100 MG ER capsule Take 3 capsules (300 mg total) by mouth at bedtime. 270 capsule 3   tiotropium (SPIRIVA HANDIHALER) 18 MCG inhalation capsule INHALE THE CONTENTS OF 1 CAPSULE EVERY DAY VIA HANDIHALER ( NEED MD APPOINTMENT FOR REFILLS ) 30 capsule 11   tiZANidine (ZANAFLEX) 4 MG tablet TAKE 1 TABLET EVERY 6 HOURS AS NEEDED FOR MUSCLE SPASMS 360 tablet 1   traMADol (ULTRAM) 50 MG tablet TAKE 1 TABLET EVERY 8 HOURS AS  NEEDED 90 tablet 2   warfarin (COUMADIN) 6 MG tablet TAKE AS DIRECTED BY COUMADIN CLINIC 70 tablet 1   No current facility-administered medications on file prior to visit.    BP 135/70   Pulse (!) 114   Temp 98.7 F (37.1 C) (Oral)   Ht 5\' 4"  (1.626 m)   Wt 136 lb (61.7 kg)   SpO2 92%   BMI 23.34 kg/m        Objective:   Physical Exam Vitals and nursing note reviewed.  Constitutional:      General: He is not in acute distress.    Appearance: Normal appearance. He is well-developed and normal weight.  HENT:     Head: Normocephalic and atraumatic.     Right Ear: Tympanic membrane, ear canal and external ear normal. There is no impacted cerumen.     Left Ear: Tympanic membrane, ear canal and external ear normal. There is no impacted cerumen.     Nose: Nose normal. No congestion or rhinorrhea.     Mouth/Throat:     Mouth: Mucous membranes are moist.     Pharynx: Oropharynx is clear. No oropharyngeal exudate or posterior oropharyngeal erythema.  Eyes:     General:        Right eye: No discharge.        Left eye: No discharge.     Extraocular Movements: Extraocular movements intact.     Conjunctiva/sclera: Conjunctivae normal.     Pupils: Pupils are equal, round, and reactive to light.  Neck:     Vascular: No carotid bruit.     Trachea: No tracheal deviation.  Cardiovascular:     Rate and Rhythm: Normal rate. Rhythm irregular.     Pulses: Normal pulses.     Heart  sounds: Normal heart sounds. No murmur heard.   No friction rub. No gallop.  Pulmonary:     Effort: Pulmonary effort is normal. No respiratory distress.     Breath sounds: Normal breath sounds. No stridor. No wheezing, rhonchi or rales.  Chest:     Chest wall: No tenderness.  Abdominal:     General: Bowel sounds are normal. There is no distension.     Palpations: Abdomen is soft. There is no mass.     Tenderness: There is no abdominal tenderness. There is no right CVA tenderness, left CVA tenderness, guarding or rebound.     Hernia: No hernia is present.  Musculoskeletal:        General: No swelling, tenderness, deformity or signs of injury. Normal range of motion.     Right lower leg: No edema.     Left lower leg: No edema.  Lymphadenopathy:     Cervical: No cervical adenopathy.  Skin:    General: Skin is warm and dry.     Capillary Refill: Capillary refill takes less than 2 seconds.     Coloration: Skin is not jaundiced or pale.     Findings: No bruising, erythema, lesion or rash.  Neurological:     General: No focal deficit present.     Mental Status: He is alert and oriented to person, place, and time.     Cranial Nerves: No cranial nerve deficit.     Sensory: No sensory deficit.     Motor: No weakness.     Coordination: Coordination normal.     Gait: Gait abnormal (steady gait with rolling walker).     Deep Tendon Reflexes: Reflexes normal.  Psychiatric:  Mood and Affect: Mood normal.        Behavior: Behavior normal.        Thought Content: Thought content normal.        Judgment: Judgment normal.      Assessment & Plan:  1. Routine general medical examination at a health care facility - Encouraged exercise and heart healthy diet  - Follow up in one year or sooner if needed - CBC with Differential/Platelet; Future - Comprehensive metabolic panel; Future - Lipid panel; Future - TSH; Future  2. Atrial fibrillation, unspecified type ([place]) - Continue with  Coumadin and Metoprolol  - CBC with Differential/Platelet; Future - Comprehensive metabolic panel; Future - Lipid panel; Future - TSH; Future  3. HYPERTENSION, BENIGN - Controlled. No change in medications - CBC with Differential/Platelet; Future - Comprehensive metabolic panel; Future - Lipid panel; Future - TSH; Future  4. Seizures ([place]) - Follow up with Neurology as directed - CBC with Differential/Platelet; Future - Comprehensive metabolic panel; Future - Lipid panel; Future - TSH; Future  5. Mixed hyperlipidemia - continue zetia  - CBC with Differential/Platelet; Future - Comprehensive metabolic panel; Future - Lipid panel; Future - TSH; Future  6. S/P mitral valve replacement - Continue with Coumadin.  - Follow up with Cardiology as directed - CBC with Differential/Platelet; Future - Comprehensive metabolic panel; Future - Lipid panel; Future - TSH; Future  7. Chronic bilateral low back pain without sciatica - Continue with Tramadol and Zanaflex PRN   8. Other emphysema ([place]) - Continue with [place]. Follow up with Pulmonary as directed  9. Need for immunization against influenza  - Flu Vaccine QUAD High Dose(Fluad)  10. History of prostate cancer  - PSA; Future  Dorothyann Peng, NP

## 2021-06-01 ENCOUNTER — Encounter: Payer: Medicare HMO | Admitting: Adult Health

## 2021-06-19 ENCOUNTER — Other Ambulatory Visit: Payer: Self-pay

## 2021-06-19 ENCOUNTER — Ambulatory Visit (INDEPENDENT_AMBULATORY_CARE_PROVIDER_SITE_OTHER): Payer: Medicare HMO

## 2021-06-19 DIAGNOSIS — Z5181 Encounter for therapeutic drug level monitoring: Secondary | ICD-10-CM

## 2021-06-19 DIAGNOSIS — I4891 Unspecified atrial fibrillation: Secondary | ICD-10-CM | POA: Diagnosis not present

## 2021-06-19 LAB — POCT INR: INR: 2.3 (ref 2.0–3.0)

## 2021-06-19 NOTE — Patient Instructions (Signed)
-   take 1 whole tablet warfarin tonight, then  - Continue taking Warfarin 1/2 tablet daily except for 1 tablet on Tuesdays and Saturdays.  - Recheck INR in 4 weeks.  Cou[facility]in Clinic [phone].

## 2021-06-21 ENCOUNTER — Ambulatory Visit: Payer: Medicare HMO

## 2021-06-26 ENCOUNTER — Ambulatory Visit (INDEPENDENT_AMBULATORY_CARE_PROVIDER_SITE_OTHER): Payer: Medicare HMO

## 2021-06-26 VITALS — Ht 64.0 in | Wt 135.0 lb

## 2021-06-26 DIAGNOSIS — Z0000 Encounter for general adult medical examination without abnormal findings: Secondary | ICD-10-CM | POA: Diagnosis not present

## 2021-06-26 NOTE — Progress Notes (Signed)
I connected with Jeffery Copeland today by telephone and verified that I am speaking with the correct person using two identifiers. Location patient: home Location provider: work Persons participating in the virtual visit: Jeffery Copeland, Glenna Durand LPN.   I discussed the limitations, risks, security and privacy concerns of performing an evaluation and management service by telephone and the availability of in person appointments. I also discussed with the patient that there may be a patient responsible charge related to this service. The patient expressed understanding and verbally consented to this telephonic visit.    Interactive audio and video telecommunications were attempted between this provider and patient, however failed, due to patient having technical difficulties OR patient did not have access to video capability.  We continued and completed visit with audio only.     Vital signs may be patient reported or missing.  Subjective:   Jeffery Copeland is a 75 y.o. male who presents for Medicare Annual/Subsequent preventive examination.  Review of Systems     Cardiac Risk Factors include: advanced age (>73men, >48 women);hypertension;male gender     Objective:    Today's Vitals   06/26/21 0811 06/26/21 0813  Weight: 135 lb (61.2 kg)   Height: 5\' 4"  (1.626 m)   PainSc:  5    Body mass index is 23.17 kg/m.  Advanced Directives 06/26/2021 06/20/2020 04/09/2017 02/03/2016 01/04/2016 12/28/2015 11/03/2015  Does Patient Have a Medical Advance Directive? Yes Yes No No Yes No Yes  Type of Advance Directive Out of facility DNR (pink MOST or yellow form) [place];Living will - - Out of facility DNR (pink MOST or yellow form) - Out of facility DNR (pink MOST or yellow form)  Does patient want to make changes to medical advance directive? - No - Patient declined - - No - Patient declined - No - Patient declined  Copy of [place] in Chart? - Yes - validated  most recent copy scanned in chart (See row information) - - Yes - Yes  Would patient like information on creating a medical advance directive? - - No - Patient declined - - No - patient declined information -  Pre-existing out of facility DNR order (yellow form or pink MOST form) - - - - - - -    Current Medications (verified) Outpatient Encounter Medications as of 06/26/2021  Medication Sig   ezetimibe (ZETIA) 10 MG tablet TAKE 1 TABLET EVERY DAY   levETIRAcetam (KEPPRA) 500 MG tablet Take 1 tablet (500 mg total) by mouth 2 (two) times daily. Must keep upcoming appt for further refills   metoprolol tartrate (LOPRESSOR) 25 MG tablet TAKE 1/2 TABLET TWICE DAILY   phenytoin (DILANTIN) 100 MG ER capsule Take 3 capsules (300 mg total) by mouth at bedtime.   tamsulosin (FLOMAX) 0.4 MG CAPS capsule Take 0.4 mg by mouth.   tiotropium (SPIRIVA HANDIHALER) 18 MCG inhalation capsule INHALE THE CONTENTS OF 1 CAPSULE EVERY DAY VIA HANDIHALER ( NEED MD APPOINTMENT FOR REFILLS )   tiZANidine (ZANAFLEX) 4 MG tablet TAKE 1 TABLET EVERY 6 HOURS AS NEEDED FOR MUSCLE SPASMS   traMADol (ULTRAM) 50 MG tablet TAKE 1 TABLET EVERY 8 HOURS AS NEEDED   warfarin (COUMADIN) 6 MG tablet TAKE AS DIRECTED BY COUMADIN CLINIC   No facility-administered encounter medications on file as of 06/26/2021.    Allergies (verified) Ace inhibitors, Lisinopril, and Rosuvastatin   History: Past Medical History:  Diagnosis Date   Acute and subacute bacterial endocarditis 1999  group G Streptococcus   Atrial fibrillation ([place]) 11/11/2008   Qualifier: Diagnosis of  By: Lia Foyer, MD, Jaquelyn Bitter    CARDIOMYOPATHY 10/20/2008   Qualifier: Diagnosis of  By: Owens Shark, RN, BSN, Lauren     Cerebrovascular disease, unspecified    Closed right acetabular fracture ([place])    COPD (chronic obstructive pulmonary disease) ([place]) 1999   Epidural hematoma 03/01/2011   Recent fall 2012    Esophageal reflux    Generalized osteoarthrosis,  unspecified site    History of tobacco abuse    Hypertension    HYPERTENSION, BENIGN 10/20/2008   Qualifier: Diagnosis of  By: Owens Shark, RN, BSN, Lauren     Hyponatremia    Insomnia    Iron deficiency anemia, unspecified    ISCHEMIC COLITIS, HX OF 10/20/2008   Qualifier: Diagnosis of  By: Owens Shark, RN, BSN, Lauren     Lung nodules 2008, 2014   LLL nodule removed 2008 with LLL lobectomy,  RLL nodule 16mm observation   Prostate cancer ([place]) 2015   Protruding sternal wires    S/P mitral valve replacement 02/01/1998   Carpentier-Edwards porcine bioprosthetic tissue valve, size 10mm Placed for acute and subacute bacterial endocarditis (group G Streptococcus) with pre-existing mitral valve prolapse    S/P mitral valve replacement with bioprosthetic valve 03/24/2013   Redo mitral valve replacement using 52mm [facility] mitral bovine bioprosthetic tissue valve performed via right mini thoracotomy   Seizures ([place])    last seizure 1 year ago   Severe mitral regurgitation 02/20/2013   Unsteady gait    Past Surgical History:  Procedure Laterality Date   CARDIAC VALVE REPLACEMENT     INTRAOPERATIVE TRANSESOPHAGEAL ECHOCARDIOGRAM N/A 03/24/2013   Procedure: INTRAOPERATIVE TRANSESOPHAGEAL ECHOCARDIOGRAM;  Surgeon: Rexene Alberts, MD;  Location: [facility];  Service: Open Heart Surgery;  Laterality: N/A;   MINIMALLY INVASIVE MAZE PROCEDURE N/A 03/24/2013   Procedure: MINIMALLY INVASIVE MAZE PROCEDURE;  Surgeon: Rexene Alberts, MD;  Location: [facility];  Service: Open Heart Surgery;  Laterality: N/A;   MITRAL VALVE REPLACEMENT  02/01/1998   Carpentier-Edwards porcine bioprosthetic tissue valve, size 45mm, placed for complicated bacterial endocarditis   MITRAL VALVE REPLACEMENT Right 03/24/2013   Procedure: MINIMALLY INVASIVE REDO MITRAL VALVE (MV) REPLACEMENT;  Surgeon: Rexene Alberts, MD;  Location: [facility];  Service: Open Heart Surgery;  Laterality: Right;   PROSTATE BIOPSY     radiation treatment tx for prostate  cancer  2015   STERNAL WIRES REMOVAL N/A 07/10/2018   Procedure: STERNAL WIRES REMOVAL;  Surgeon: Rexene Alberts, MD;  Location: [facility];  Service: Thoracic;  Laterality: N/A;   TEE WITHOUT CARDIOVERSION N/A 02/12/2013   Procedure: TRANSESOPHAGEAL ECHOCARDIOGRAM (TEE);  Surgeon: Fay Records, MD;  Location: [facility] ENDOSCOPY;  Service: Cardiovascular;  Laterality: N/A;   VIDEO ASSISTED THORACOSCOPY  04/29/2007   Left VATS w/ mini thoracotomy for Left Lower Lobectomy for benign lung nodules   Family History  Problem Relation Age of Onset   Cancer Father        prostate cancer   Tuberculosis Father    Heart attack Father    Cancer Brother        prostate cancer   Arthritis Mother    Lung disease Neg Hx    Social History   Socioeconomic History   Marital status: Divorced    Spouse name: Not on file   Number of children: 2   Years of education: 9   Highest education level: Not on file  Occupational History   Occupation: disabled  Tobacco Use   Smoking status: Former    Packs/day: 2.00    Years: 45.00    Pack years: 90.00    Types: Cigarettes    Quit date: 07/30/2006    Years since quitting: 14.9   Smokeless tobacco: Never  Vaping Use   Vaping Use: Never used  Substance and Sexual Activity   Alcohol use: No    Alcohol/week: 0.0 standard drinks    Comment: previous alcohol use & home-made alcohol consumption   Drug use: No   Sexual activity: Not Currently  Other Topics Concern   Not on file  Social History Narrative   Patient is divorced and has 2 children.   Patient is right handed.   Patient has 9 th grade education.    Patient drinks diet sodas.      [place] [facility]:   From [place] originally. Always lived in [place]. 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 Previously traveled to [place], [place], & [place]. No pets currently. Remote pet owl and parakeet exposure. No mold exposure. Enjoys fishing.    Social Determinants of Health   Financial Resource Strain:  Low Risk    Difficulty of Paying Living Expenses: Not hard at all  Food Insecurity: No Food Insecurity   Worried About Charity fundraiser in the Last Year: Never true   [place] in the Last Year: Never true  Transportation Needs: No Transportation Needs   Lack of Transportation (Medical): No   Lack of Transportation (Non-Medical): No  Physical Activity: Insufficiently Active   Days of Exercise per Week: 6 days   Minutes of Exercise per Session: 20 min  Stress: No Stress Concern Present   Feeling of Stress : Not at all  Social Connections: Not on file    Tobacco Counseling Counseling given: Not Answered   Clinical Intake:  Pre-visit preparation completed: Yes  Pain : 0-10 Pain Score: 5  Pain Type: Chronic pain Pain Location: Neck Pain Descriptors / Indicators: Aching Pain Frequency: Intermittent     Nutritional Status: BMI of 19-24  Normal Nutritional Risks: None Diabetes: No  How often do you need to have someone help you when you read instructions, pamphlets, or other written materials from your doctor or pharmacy?: 1 - Never What is the last grade level you completed in school?: 9th grade  Diabetic? no  Interpreter Needed?: No  Information entered by :: NAllen LPN   Activities of Daily Living In your present state of health, do you have any difficulty performing the following activities: 06/26/2021  Hearing? Y  Vision? N  Difficulty concentrating or making decisions? N  Walking or climbing stairs? Y  Dressing or bathing? N  Doing errands, shopping? N  Preparing Food and eating ? N  Using the Toilet? N  In the past six months, have you accidently leaked urine? N  Do you have problems with loss of bowel control? N  Managing your Medications? N  Managing your Finances? N  Housekeeping or managing your Housekeeping? N  Some recent data might be hidden    Patient Care Team: Dorothyann Peng, NP as PCP - General (Family Medicine) Elsie Stain,  MD (Pulmonary Disease) Elsie Stain, MD (Pulmonary Disease) Willey Blade, MD as Attending Physician (Internal Medicine) Willey Blade, MD as Attending Physician (Internal Medicine) Glendale Chard, MD (Internal Medicine) Ardis Hughs, MD as Attending Physician (Urology)  Indicate any recent Medical Services you [phone]  have received from other than [facility] providers in the past year (date may be approximate).     Assessment:   This is a routine wellness examination for [place].  Hearing/Vision screen Vision Screening - Comments:: Regular eye exams,  [facility]  Dietary issues and exercise activities discussed: Current Exercise Habits: Home exercise routine, Type of exercise: walking, Time (Minutes): 20, Frequency (Times/Week): 6, Weekly Exercise (Minutes/Week): 120   Goals Addressed             This Visit's Progress    Patient Stated       06/26/2021, no goals       Depression Screen PHQ 2/9 Scores 06/26/2021 06/20/2020 03/25/2018 03/15/2016 03/02/2016 06/09/2015 06/03/2015  PHQ - 2 Score 0 0 0 0 1 0 0  PHQ- 9 Score - 0 - - - - -    Fall Risk Fall Risk  06/26/2021 06/20/2020 03/25/2018 10/22/2017 04/24/2017  Falls in the past year? 0 0 No No No  Number falls in past yr: - 0 - - -  Injury with Fall? - 0 - - -  Risk for fall due to : Impaired balance/gait;Impaired mobility;Medication side effect Impaired balance/gait - - -  Follow up Falls evaluation completed;Education provided;Falls prevention discussed Falls evaluation completed;Falls prevention discussed - - -  Comment - - - - -    FALL RISK PREVENTION PERTAINING TO THE HOME:  Any stairs in or around the home? No  If so, are there any without handrails? N/a Home free of loose throw rugs in walkways, pet beds, electrical cords, etc? Yes  Adequate lighting in your home to reduce risk of falls? Yes   ASSISTIVE DEVICES UTILIZED TO PREVENT FALLS:  Life alert? Yes  Use of a cane, walker or w/c? Yes  Grab  bars in the bathroom? Yes  Shower chair or bench in shower? Yes  Elevated toilet seat or a handicapped toilet? Yes   TIMED UP AND GO:  Was the test performed? No .      Cognitive Function:     6CIT Screen 06/26/2021  What Year? 0 points  What month? 0 points  What time? 0 points  Count back from 20 0 points  Months in reverse 2 points  Repeat phrase 2 points  Total Score 4    Immunizations Immunization History  Administered Date(s) Administered   Fluad Quad(high Dose 65+) 07/09/2019, 06/20/2020, 05/23/2021   Influenza Split 03/30/2012, 06/15/2015   Influenza Whole 07/02/2011   Influenza, High Dose Seasonal PF 04/29/2016, 09/06/2017, 08/05/2018   Influenza-Unspecified 03/30/2014, 06/30/2015   MODERNA COVID-19 SARS-COV-2 PEDS BIVALENT BOOSTER 6Y-11Y 10/07/2019, 11/04/2019   PPD Test 10/26/2015, 01/03/2016   Pneumococcal Conjugate-13 03/25/2018   Pneumococcal Polysaccharide-23 02/26/2020   Td 12/27/2015   Tdap 12/22/2012    TDAP status: Up to date  Flu Vaccine status: Up to date  Pneumococcal vaccine status: Up to date  Covid-19 vaccine status: Completed vaccines  Qualifies for Shingles Vaccine? Yes   Zostavax completed No   Shingrix Completed?: No.    Education has been provided regarding the importance of this vaccine. Patient has been advised to call insurance company to determine out of pocket expense if they have not yet received this vaccine. Advised may also receive vaccine at local pharmacy or Health Dept. Verbalized acceptance and understanding.  Screening Tests Health Maintenance  Topic Date Due   Zoster Vaccines- Shingrix (1 of 2) Never done   COLONOSCOPY (Pts 45-64yrs Insurance coverage will need to be confirmed)  04/13/2017   COVID-19 Vaccine (1) 11/04/2019   TETANUS/TDAP  12/26/2025   Pneumonia Vaccine 54+ Years old  Completed   INFLUENZA VACCINE  Completed   Hepatitis C Screening  Completed   HPV VACCINES  Aged Out    Health  Maintenance  Health Maintenance Due  Topic Date Due   Zoster Vaccines- Shingrix (1 of 2) Never done   COLONOSCOPY (Pts 45-29yrs Insurance coverage will need to be confirmed)  04/13/2017   COVID-19 Vaccine (1) 11/04/2019    Colorectal cancer screening: No longer required.   Lung Cancer Screening: (Low Dose CT Chest recommended if Age 45-80 years, 30 pack-year currently smoking OR have quit w/in 15years.) does qualify.   Lung Cancer Screening Referral: no  Additional Screening:  Hepatitis C Screening: does qualify; Completed 03/15/2017  Vision Screening: Recommended annual ophthalmology exams for early detection of glaucoma and other disorders of the eye. Is the patient up to date with their annual eye exam?  Yes  Who is the provider or what is the name of the office in which the patient attends annual eye exams? [facility] If pt is not established with a provider, would they like to be referred to a provider to establish care? No .   Dental Screening: Recommended annual dental exams for proper oral hygiene  Community Resource Referral / Chronic Care Management: CRR required this visit?  No   CCM required this visit?  No      Plan:     I have personally reviewed and noted the following in the patient's chart:   Medical and social history Use of alcohol, tobacco or illicit drugs  Current medications and supplements including opioid prescriptions. Patient is currently taking opioid prescriptions. Information provided to patient regarding non-opioid alternatives. Patient advised to discuss non-opioid treatment plan with their provider. Functional ability and status Nutritional status Physical activity Advanced directives List of other physicians Hospitalizations, surgeries, and ER visits in previous 12 months Vitals Screenings to include cognitive, depression, and falls Referrals and appointments  In addition, I have reviewed and discussed with patient certain  preventive protocols, quality metrics, and best practice recommendations. A written personalized care plan for preventive services as well as general preventive health recommendations were provided to patient.     Kellie Simmering, LPN   [id number]   Nurse Notes: none

## 2021-06-26 NOTE — Patient Instructions (Signed)
Jeffery Copeland , Thank you for taking time to come for your Medicare Wellness Visit. I appreciate your ongoing commitment to your health goals. Please review the following plan we discussed and let me know if I can assist you in the future.   Screening recommendations/referrals: Colonoscopy: not required Recommended yearly ophthalmology/optometry visit for glaucoma screening and checkup Recommended yearly dental visit for hygiene and checkup  Vaccinations: Influenza vaccine: completed 05/23/2021 Pneumococcal vaccine: completed 02/26/2020 Tdap vaccine: completed 5/30/017, due 12/26/2025 Shingles vaccine: discussed   Covid-19:  10/07/2019, 11/04/2019  Advanced directives: copy in chart  Conditions/risks identified: none  Next appointment: Follow up in one year for your annual wellness visit.   Preventive Care 6 Years and Older, Male Preventive care refers to lifestyle choices and visits with your health care provider that can promote health and wellness. What does preventive care include? A yearly physical exam. This is also called an annual well check. Dental exams once or twice a year. Routine eye exams. Ask your health care provider how often you should have your eyes checked. Personal lifestyle choices, including: Daily care of your teeth and gums. Regular physical activity. Eating a healthy diet. Avoiding tobacco and drug use. Limiting alcohol use. Practicing safe sex. Taking low doses of aspirin every day. Taking vitamin and mineral supplements as recommended by your health care provider. What happens during an annual well check? The services and screenings done by your health care provider during your annual well check will depend on your age, overall health, lifestyle risk factors, and family history of disease. Counseling  Your health care provider may ask you questions about your: Alcohol use. Tobacco use. Drug use. Emotional well-being. Home and relationship  well-being. Sexual activity. Eating habits. History of falls. Memory and ability to understand (cognition). Work and work Statistician. Screening  You may have the following tests or measurements: Height, weight, and BMI. Blood pressure. Lipid and cholesterol levels. These may be checked every 5 years, or more frequently if you are over 73 years old. Skin check. Lung cancer screening. You may have this screening every year starting at age 26 if you have a 30-pack-year history of smoking and currently smoke or have quit within the past 15 years. Fecal occult blood test (FOBT) of the stool. You may have this test every year starting at age 52. Flexible sigmoidoscopy or colonoscopy. You may have a sigmoidoscopy every 5 years or a colonoscopy every 10 years starting at age 72. Prostate cancer screening. Recommendations will vary depending on your family history and other risks. Hepatitis C blood test. Hepatitis B blood test. Sexually transmitted disease (STD) testing. Diabetes screening. This is done by checking your blood sugar (glucose) after you have not eaten for a while (fasting). You may have this done every 1-3 years. Abdominal aortic aneurysm (AAA) screening. You may need this if you are a current or former smoker. Osteoporosis. You may be screened starting at age 40 if you are at high risk. Talk with your health care provider about your test results, treatment options, and if necessary, the need for more tests. Vaccines  Your health care provider may recommend certain vaccines, such as: Influenza vaccine. This is recommended every year. Tetanus, diphtheria, and acellular pertussis (Tdap, Td) vaccine. You may need a Td booster every 10 years. Zoster vaccine. You may need this after age 4. Pneumococcal 13-valent conjugate (PCV13) vaccine. One dose is recommended after age 14. Pneumococcal polysaccharide (PPSV23) vaccine. One dose is recommended after age 91. Talk to  your health care  provider about which screenings and vaccines you need and how often you need them. This information is not intended to replace advice given to you by your health care provider. Make sure you discuss any questions you have with your health care provider. Document Released: 08/12/2015 Document Revised: 04/04/2016 Document Reviewed: 05/17/2015 Elsevier Interactive Patient Education  2017 [place] Prevention in the Home Falls can cause injuries. They can happen to people of all ages. There are many things you can do to make your home safe and to help prevent falls. What can I do on the outside of my home? Regularly fix the edges of walkways and driveways and fix any cracks. Remove anything that might make you trip as you walk through a door, such as a raised step or threshold. Trim any bushes or trees on the path to your home. Use bright outdoor lighting. Clear any walking paths of anything that might make someone trip, such as rocks or tools. Regularly check to see if handrails are loose or broken. Make sure that both sides of any steps have handrails. Any raised decks and porches should have guardrails on the edges. Have any leaves, snow, or ice cleared regularly. Use sand or salt on walking paths during winter. Clean up any spills in your garage right away. This includes oil or grease spills. What can I do in the bathroom? Use night lights. Install grab bars by the toilet and in the tub and shower. Do not use towel bars as grab bars. Use non-skid mats or decals in the tub or shower. If you need to sit down in the shower, use a plastic, non-slip stool. Keep the floor dry. Clean up any water that spills on the floor as soon as it happens. Remove soap buildup in the tub or shower regularly. Attach bath mats securely with double-sided non-slip rug tape. Do not have throw rugs and other things on the floor that can make you trip. What can I do in the bedroom? Use night lights. Make  sure that you have a light by your bed that is easy to reach. Do not use any sheets or blankets that are too big for your bed. They should not hang down onto the floor. Have a firm chair that has side arms. You can use this for support while you get dressed. Do not have throw rugs and other things on the floor that can make you trip. What can I do in the kitchen? Clean up any spills right away. Avoid walking on wet floors. Keep items that you use a lot in easy-to-reach places. If you need to reach something above you, use a strong step stool that has a grab bar. Keep electrical cords out of the way. Do not use floor polish or wax that makes floors slippery. If you must use wax, use non-skid floor wax. Do not have throw rugs and other things on the floor that can make you trip. What can I do with my stairs? Do not leave any items on the stairs. Make sure that there are handrails on both sides of the stairs and use them. Fix handrails that are broken or loose. Make sure that handrails are as long as the stairways. Check any carpeting to make sure that it is firmly attached to the stairs. Fix any carpet that is loose or worn. Avoid having throw rugs at the top or bottom of the stairs. If you do have throw rugs, attach  them to the floor with carpet tape. Make sure that you have a light switch at the top of the stairs and the bottom of the stairs. If you do not have them, ask someone to add them for you. What else can I do to help prevent falls? Wear shoes that: Do not have high heels. Have rubber bottoms. Are comfortable and fit you well. Are closed at the toe. Do not wear sandals. If you use a stepladder: Make sure that it is fully opened. Do not climb a closed stepladder. Make sure that both sides of the stepladder are locked into place. Ask someone to hold it for you, if possible. Clearly mark and make sure that you can see: Any grab bars or handrails. First and last steps. Where the  edge of each step is. Use tools that help you move around (mobility aids) if they are needed. These include: Canes. Walkers. Scooters. Crutches. Turn on the lights when you go into a dark area. Replace any light bulbs as soon as they burn out. Set up your furniture so you have a clear path. Avoid moving your furniture around. If any of your floors are uneven, fix them. If there are any pets around you, be aware of where they are. Review your medicines with your doctor. Some medicines can make you feel dizzy. This can increase your chance of falling. Ask your doctor what other things that you can do to help prevent falls. This information is not intended to replace advice given to you by your health care provider. Make sure you discuss any questions you have with your health care provider. Document Released: 05/12/2009 Document Revised: 12/22/2015 Document Reviewed: 08/20/2014 Elsevier Interactive Patient Education  2017 [employer].

## 2021-06-28 ENCOUNTER — Telehealth: Payer: Self-pay | Admitting: Student

## 2021-06-28 NOTE — Telephone Encounter (Signed)
disregard

## 2021-07-17 ENCOUNTER — Ambulatory Visit (INDEPENDENT_AMBULATORY_CARE_PROVIDER_SITE_OTHER): Payer: Medicare HMO

## 2021-07-17 ENCOUNTER — Other Ambulatory Visit: Payer: Self-pay

## 2021-07-17 DIAGNOSIS — I4891 Unspecified atrial fibrillation: Secondary | ICD-10-CM

## 2021-07-17 DIAGNOSIS — H3122 Choroidal dystrophy (central areolar) (generalized) (peripapillary): Secondary | ICD-10-CM | POA: Diagnosis not present

## 2021-07-17 DIAGNOSIS — H353131 Nonexudative age-related macular degeneration, bilateral, early dry stage: Secondary | ICD-10-CM | POA: Diagnosis not present

## 2021-07-17 DIAGNOSIS — Z5181 Encounter for therapeutic drug level monitoring: Secondary | ICD-10-CM

## 2021-07-17 DIAGNOSIS — Z961 Presence of intraocular lens: Secondary | ICD-10-CM | POA: Diagnosis not present

## 2021-07-17 LAB — POCT INR: INR: 3.4 — AB (ref 2.0–3.0)

## 2021-07-17 NOTE — Patient Instructions (Signed)
Description   Continue taking Warfarin 1/2 tablet daily except for 1 tablet on Tuesdays and Saturdays.  Recheck INR in 4 weeks.  [facility] [phone].

## 2021-07-18 ENCOUNTER — Other Ambulatory Visit: Payer: Self-pay | Admitting: Adult Health

## 2021-07-18 DIAGNOSIS — Z760 Encounter for issue of repeat prescription: Secondary | ICD-10-CM

## 2021-08-01 DIAGNOSIS — H43822 Vitreomacular adhesion, left eye: Secondary | ICD-10-CM | POA: Diagnosis not present

## 2021-08-01 DIAGNOSIS — H43811 Vitreous degeneration, right eye: Secondary | ICD-10-CM | POA: Diagnosis not present

## 2021-08-01 DIAGNOSIS — H35363 Drusen (degenerative) of macula, bilateral: Secondary | ICD-10-CM | POA: Diagnosis not present

## 2021-08-01 DIAGNOSIS — H353231 Exudative age-related macular degeneration, bilateral, with active choroidal neovascularization: Secondary | ICD-10-CM | POA: Diagnosis not present

## 2021-08-18 ENCOUNTER — Other Ambulatory Visit: Payer: Self-pay

## 2021-08-18 ENCOUNTER — Ambulatory Visit (INDEPENDENT_AMBULATORY_CARE_PROVIDER_SITE_OTHER): Payer: Medicare HMO | Admitting: *Deleted

## 2021-08-18 DIAGNOSIS — I4891 Unspecified atrial fibrillation: Secondary | ICD-10-CM | POA: Diagnosis not present

## 2021-08-18 DIAGNOSIS — Z5181 Encounter for therapeutic drug level monitoring: Secondary | ICD-10-CM

## 2021-08-18 LAB — POCT INR: INR: 3 (ref 2.0–3.0)

## 2021-08-18 NOTE — Patient Instructions (Signed)
Description   Continue taking Warfarin 1/2 tablet daily except for 1 tablet on Tuesdays and Saturdays.  Recheck INR in 5 weeks.  [facility] [phone].

## 2021-09-22 ENCOUNTER — Ambulatory Visit (INDEPENDENT_AMBULATORY_CARE_PROVIDER_SITE_OTHER): Payer: Medicare HMO

## 2021-09-22 ENCOUNTER — Other Ambulatory Visit: Payer: Self-pay

## 2021-09-22 DIAGNOSIS — I4891 Unspecified atrial fibrillation: Secondary | ICD-10-CM

## 2021-09-22 DIAGNOSIS — Z5181 Encounter for therapeutic drug level monitoring: Secondary | ICD-10-CM

## 2021-09-22 LAB — POCT INR: INR: 3.2 — AB (ref 2.0–3.0)

## 2021-09-22 NOTE — Patient Instructions (Signed)
Description   Continue taking Warfarin 1/2 tablet daily except for 1 tablet on Tuesdays and Saturdays.  Recheck INR in 6 weeks.  [facility] [phone].

## 2021-09-26 DIAGNOSIS — H353221 Exudative age-related macular degeneration, left eye, with active choroidal neovascularization: Secondary | ICD-10-CM | POA: Diagnosis not present

## 2021-09-26 DIAGNOSIS — H43811 Vitreous degeneration, right eye: Secondary | ICD-10-CM | POA: Diagnosis not present

## 2021-09-26 DIAGNOSIS — H35361 Drusen (degenerative) of macula, right eye: Secondary | ICD-10-CM | POA: Diagnosis not present

## 2021-09-26 DIAGNOSIS — H35033 Hypertensive retinopathy, bilateral: Secondary | ICD-10-CM | POA: Diagnosis not present

## 2021-10-27 ENCOUNTER — Other Ambulatory Visit: Payer: Self-pay | Admitting: Adult Health

## 2021-10-27 NOTE — Telephone Encounter (Signed)
Okay for refill?  

## 2021-10-30 ENCOUNTER — Other Ambulatory Visit: Payer: Self-pay | Admitting: Cardiology

## 2021-11-03 ENCOUNTER — Ambulatory Visit (INDEPENDENT_AMBULATORY_CARE_PROVIDER_SITE_OTHER): Payer: Medicare HMO

## 2021-11-03 DIAGNOSIS — I4891 Unspecified atrial fibrillation: Secondary | ICD-10-CM | POA: Diagnosis not present

## 2021-11-03 DIAGNOSIS — Z5181 Encounter for therapeutic drug level monitoring: Secondary | ICD-10-CM

## 2021-11-03 LAB — POCT INR: INR: 3.6 — AB (ref 2.0–3.0)

## 2021-11-03 NOTE — Patient Instructions (Signed)
Description   ?Eat a serving of greens today and continue taking Warfarin 1/2 tablet daily except for 1 tablet on Tuesdays and Saturdays.  ?Recheck INR in 6 weeks.   ?[facility] [phone].  ?  ?   ?

## 2021-12-15 ENCOUNTER — Ambulatory Visit (INDEPENDENT_AMBULATORY_CARE_PROVIDER_SITE_OTHER): Payer: Medicare HMO

## 2021-12-15 DIAGNOSIS — Z5181 Encounter for therapeutic drug level monitoring: Secondary | ICD-10-CM | POA: Diagnosis not present

## 2021-12-15 DIAGNOSIS — I4891 Unspecified atrial fibrillation: Secondary | ICD-10-CM

## 2021-12-15 LAB — POCT INR: INR: 3.9 — AB (ref 2.0–3.0)

## 2021-12-15 NOTE — Patient Instructions (Signed)
Description   Hold today's dose and then continue taking Warfarin 1/2 tablet daily except for 1 tablet on Tuesdays and Saturdays.  Recheck INR in 5 weeks.   [facility] [phone].

## 2021-12-19 DIAGNOSIS — H43823 Vitreomacular adhesion, bilateral: Secondary | ICD-10-CM | POA: Diagnosis not present

## 2021-12-19 DIAGNOSIS — H35361 Drusen (degenerative) of macula, right eye: Secondary | ICD-10-CM | POA: Diagnosis not present

## 2021-12-19 DIAGNOSIS — H43811 Vitreous degeneration, right eye: Secondary | ICD-10-CM | POA: Diagnosis not present

## 2021-12-19 DIAGNOSIS — H353221 Exudative age-related macular degeneration, left eye, with active choroidal neovascularization: Secondary | ICD-10-CM | POA: Diagnosis not present

## 2022-01-01 ENCOUNTER — Other Ambulatory Visit: Payer: Self-pay | Admitting: Adult Health

## 2022-01-16 ENCOUNTER — Other Ambulatory Visit: Payer: Self-pay | Admitting: Cardiology

## 2022-01-16 ENCOUNTER — Other Ambulatory Visit: Payer: Self-pay | Admitting: Adult Health

## 2022-01-16 DIAGNOSIS — I4891 Unspecified atrial fibrillation: Secondary | ICD-10-CM

## 2022-01-17 ENCOUNTER — Other Ambulatory Visit: Payer: Self-pay | Admitting: Adult Health

## 2022-01-23 ENCOUNTER — Other Ambulatory Visit: Payer: Self-pay | Admitting: Adult Health

## 2022-01-23 DIAGNOSIS — Z760 Encounter for issue of repeat prescription: Secondary | ICD-10-CM

## 2022-01-24 ENCOUNTER — Ambulatory Visit (INDEPENDENT_AMBULATORY_CARE_PROVIDER_SITE_OTHER): Payer: Medicare HMO | Admitting: *Deleted

## 2022-01-24 DIAGNOSIS — I4891 Unspecified atrial fibrillation: Secondary | ICD-10-CM

## 2022-01-24 DIAGNOSIS — Z5181 Encounter for therapeutic drug level monitoring: Secondary | ICD-10-CM | POA: Diagnosis not present

## 2022-01-24 LAB — POCT INR: INR: 3.1 — AB (ref 2.0–3.0)

## 2022-01-24 NOTE — Patient Instructions (Signed)
Description   Continue taking Warfarin 1/2 tablet daily except for 1 tablet on Tuesdays and Saturdays. Recheck INR in 6 weeks.   [facility] [phone].

## 2022-02-15 ENCOUNTER — Other Ambulatory Visit: Payer: Self-pay | Admitting: Student

## 2022-02-16 ENCOUNTER — Other Ambulatory Visit: Payer: Self-pay | Admitting: Adult Health

## 2022-02-16 DIAGNOSIS — H43811 Vitreous degeneration, right eye: Secondary | ICD-10-CM | POA: Diagnosis not present

## 2022-02-16 DIAGNOSIS — H43822 Vitreomacular adhesion, left eye: Secondary | ICD-10-CM | POA: Diagnosis not present

## 2022-02-16 DIAGNOSIS — H35361 Drusen (degenerative) of macula, right eye: Secondary | ICD-10-CM | POA: Diagnosis not present

## 2022-02-16 DIAGNOSIS — H35033 Hypertensive retinopathy, bilateral: Secondary | ICD-10-CM | POA: Diagnosis not present

## 2022-02-20 NOTE — Telephone Encounter (Signed)
Okay for refill?    LOV 04/2021 CPE   Last Refill      traMADol (ULTRAM) 50 MG tablet 90 tablet 0 10/27/2021   Sig:   TAKE 1 TABLET EVERY 8 HOURS AS NEEDED      Route:   (none)

## 2022-02-21 NOTE — Progress Notes (Unsigned)
PATIENT: Jeffery Copeland DOB: [birth year]  REASON FOR VISIT: follow up for stroke and seizure history HISTORY FROM: patient    Chief complaint: No chief complaint on file.     HISTORY OF PRESENT ILLNESS:  Update 02/21/2022 JM: Patient returns for 1 year stroke and seizure follow-up.  He has been doing well over the past year without any new or reoccurring stroke/TIA symptoms nor any seizure activity.  Remains on Keppra and Dilantin, denies side effects.  Phenytoin level 8.5 and vitamin D 29.7 01/2021.  Advised to follow-up with PCP for slightly low vitamin D levels.  Remains on warfarin and Zetia for secondary stroke prevention measures and atrial fibrillation, denies side effects.  INR level stable.  Blood pressure today ***.  Routinely follows with PCP, cardiology and A-fib clinic.       History provided for reference purposes only Update 02/20/2021 JM: [name] returns for yearly stroke and seizure follow-up unaccompanied.  Been doing well since prior visit without new stroke/TIA symptoms or seizure activity.  Compliant on Keppra and Dilantin without associated side effects.  Compliant on warfarin and Zetia without associated side effects.  Blood pressure today 124/62.  No new concerns at this time.  Update 11/05/2019 JM: [name] is being seen today for history of stroke and seizures.  He has been stable since prior visit 1 year ago -denies seizures or stroke symptoms. Ambulate with rolling walker maintaining ADLs independently.  Continues on Keppra and Dilantin -denies side effects.  Continues on warfarin for history of atrial fibrillation and secondary stroke prevention with stable INR levels.  Blood pressure today satisfactory 138/78.  No concerns at this time.  Update 11/03/2018 JM: [name] continues to be followed in this office for seizure and medication management with one-year follow-up visit scheduled today.  He continues on Keppra and Dilantin which she tolerates well without any  recurrent seizure activity.  At prior visit, 10/22/2017, lab work obtained which were all satisfactory.  He continues to complete all ADLs and IADLs independently but he does not drive at this time.  He continues on warfarin without side effects of bleeding or bruising with most recent INR level elevated at 4.1 with goal 2.5-3.5. He plans on obtaining repeat level on 11/17/18.  He continues to receive routine lab work and management through anticoagulation clinic.  Denies any new or recurrent stroke/TIA symptoms.   Update 10/22/2017 MM: [name] is a 76 year old male with a history of seizures and stroke.  He returns today for follow-up.  He remains on Keppra and Dilantin.  He denies any seizure events.  He does not operate a motor vehicle.  He is able to complete all ADLs independently.  He denies any changes with his gait or balance.  Denies any recent falls.  He remains on Coumadin.  Reports that his INR numbers have remained stable.  He denies any strokelike symptoms.  He returns today for evaluation.  Update 04/24/17 MM: [name] is a 76 year old male with a history of seizure events. He returns today for follow-up. He remains on Keppra 500 mg twice a day as well as Dilantin 100 mg at bedtime. He denies any seizure events. He lives at home alone. He is able to complete all ADLs independently. He does not operate a motor vehicle. He reports that his hip is better but still sore. He continues to use a walker when ambulating. He denies any new neurological symptoms. He returns today for an evaluation.  REVIEW OF SYSTEMS: Out of a complete 14 system review of symptoms, the patient complains only of the following symptoms, and all other reviewed systems are negative. No complaints   ALLERGIES: Allergies  Allergen Reactions   Ace Inhibitors Swelling   Lisinopril     Seizures    Rosuvastatin Other (See Comments)    Muscle aches    HOME MEDICATIONS: Outpatient Medications Prior to Visit   Medication Sig Dispense Refill   ezetimibe (ZETIA) 10 MG tablet TAKE 1 TABLET EVERY DAY 90 tablet 3   levETIRAcetam (KEPPRA) 500 MG tablet TAKE 1 TABLET 2 TIMES DAILY. MUST KEEP UPCOMING APPT FOR FURTHER REFILLS 180 tablet 0   metoprolol tartrate (LOPRESSOR) 25 MG tablet TAKE 1/2 TABLET TWICE DAILY 90 tablet 3   phenytoin (DILANTIN) 100 MG ER capsule TAKE 3 CAPSULES BY MOUTH AT BEDTIME. 270 capsule 3   tamsulosin (FLOMAX) 0.4 MG CAPS capsule Take 0.4 mg by mouth.     tiotropium (SPIRIVA HANDIHALER) 18 MCG inhalation capsule INHALE THE CONTENTS OF 1 CAPSULE EVERY DAY VIA HANDIHALER (NEED MD APPOINTMENT FOR REFILLS) 90 capsule 0   tiZANidine (ZANAFLEX) 4 MG tablet TAKE 1 TABLET EVERY 6 HOURS AS NEEDED FOR MUSCLE SPASMS 360 tablet 1   traMADol (ULTRAM) 50 MG tablet TAKE 1 TABLET EVERY 8 HOURS AS NEEDED 90 tablet 2   warfarin (COUMADIN) 6 MG tablet TAKE 1/2 TO 1 TABLET ONE TIME DAILY AS DIRECTED BY COUMADIN CLINIC 90 tablet 1   No facility-administered medications prior to visit.    PAST MEDICAL HISTORY: Past Medical History:  Diagnosis Date   Acute and subacute bacterial endocarditis 1999   group G Streptococcus   Atrial fibrillation ([place]) 11/11/2008   Qualifier: Diagnosis of  By: Lia Foyer, MD, Jaquelyn Bitter    CARDIOMYOPATHY 10/20/2008   Qualifier: Diagnosis of  By: Owens Shark, RN, BSN, Lauren     Cerebrovascular disease, unspecified    Closed right acetabular fracture ([place])    COPD (chronic obstructive pulmonary disease) ([place]) 1999   Epidural hematoma 03/01/2011   Recent fall 2012    Esophageal reflux    Generalized osteoarthrosis, unspecified site    History of tobacco abuse    Hypertension    HYPERTENSION, BENIGN 10/20/2008   Qualifier: Diagnosis of  By: Owens Shark, RN, BSN, Lauren     Hyponatremia    Insomnia    Iron deficiency anemia, unspecified    ISCHEMIC COLITIS, HX OF 10/20/2008   Qualifier: Diagnosis of  By: Owens Shark, RN, BSN, Lauren     Lung nodules 2008, 2014   LLL nodule  removed 2008 with LLL lobectomy,  RLL nodule 2m observation   [place] cancer [place]   Protruding sternal wires    S/P mitral valve replacement 02/01/1998   Carpentier-Edwards porcine bioprosthetic tissue valve, size 360mPlaced for acute and subacute bacterial endocarditis (group G Streptococcus) with pre-existing mitral valve prolapse    S/P mitral valve replacement with bioprosthetic valve 03/24/2013   Redo mitral valve replacement using [id number] bovine bioprosthetic tissue valve performed via right mini thoracotomy   Seizures ([facility]  last seizure 1 year ago   Severe mitral regurgitation 02/20/2013   Unsteady gait     PAST SURGICAL HISTORY: Past Surgical History:  Procedure Laterality Date   CARDIAC VALVE REPLACEMENT     INTRAOPERATIVE TRANSESOPHAGEAL ECHOCARDIOGRAM N/A 03/24/2013   Procedure: INTRAOPERATIVE TRANSESOPHAGEAL ECHOCARDIOGRAM;  Surgeon: ClaRexene AlbertsD;  [facility];  Laterality:  N/A;   MINIMALLY INVASIVE MAZE PROCEDURE N/A 03/24/2013   Procedure: MINIMALLY INVASIVE MAZE PROCEDURE;  Surgeon: Rexene Alberts, MD;  [place];  Service: Open Heart Surgery;  Laterality: N/A;   MITRAL VALVE REPLACEMENT  02/01/1998   Carpentier-Edwards porcine bioprosthetic tissue valve, size 5m, placed for complicated bacterial endocarditis   MITRAL VALVE REPLACEMENT Right 03/24/2013   Procedure: MINIMALLY INVASIVE REDO MITRAL VALVE (MV) REPLACEMENT;  Surgeon: CRexene Alberts MD;  [place]  Service: Open Heart Surgery;  Laterality: Right;   PROSTATE BIOPSY     radiation treatment tx for prostate cancer  2015   STERNAL WIRES REMOVAL N/A 07/10/2018   Procedure: STERNAL WIRES REMOVAL;  Surgeon: ORexene Alberts MD;  [place]  Service: Thoracic;  Laterality: N/A;   TEE WITHOUT CARDIOVERSION N/A 02/12/2013   Procedure: TRANSESOPHAGEAL ECHOCARDIOGRAM (TEE);  Surgeon: PFay Records MD;  [facility];  Service:  Cardiovascular;  Laterality: N/A;   VIDEO ASSISTED THORACOSCOPY  04/29/2007   Left VATS w/ mini thoracotomy for Left Lower Lobectomy for benign lung nodules    FAMILY HISTORY: Family History  Problem Relation Age of Onset   Cancer Father        prostate cancer   Tuberculosis Father    Heart attack Father    Cancer Brother        prostate cancer   Arthritis Mother    Lung disease Neg Hx     SOCIAL HISTORY: Social History   Socioeconomic History   Marital status: Divorced    Spouse name: Not on file   Number of children: 2   Years of education: 9   Highest education level: Not on file  Occupational History   Occupation: disabled  Tobacco Use   Smoking status: Former    Packs/day: 2.00    Years: 45.00    Total pack years: 90.00    Types: Cigarettes    Quit date: 07/30/2006    Years since quitting: 15.5   Smokeless tobacco: Never  Vaping Use   Vaping Use: Never used  Substance and Sexual Activity   Alcohol use: No    Alcohol/week: 0.0 standard drinks of alcohol    Comment: previous alcohol use & home-made alcohol consumption   Drug use: No   Sexual activity: Not Currently  Other Topics Concern   Not on file  Social History Narrative   Patient is divorced and has 2 children.   Patient is right handed.   Patient has 9 th grade education.    Patient drinks diet sodas.      [facility]:   From [place] originally. [place] He worked in cNurse, learning disabilitydid roofing. Questionable asbestos exposure. [place] pets currently. Remote pet owl and parakeet exposure. No mold exposure. Enjoys fishing.    Social Determinants of Health   Financial Resource Strain: Low Risk  (06/26/2021)   Overall Financial Resource Strain (CARDIA)    Difficulty of Paying Living Expenses: Not hard at all  Food Insecurity: No Food Insecurity (06/26/2021)   Hunger Vital Sign    Worried About Running Out of Food in the Last Year: Never true    Ran Out of  Food in the Last Year: Never true  Transportation Needs: No Transportation Needs (06/26/2021)   PRAPARE - THydrologist(Medical): No    Lack of Transportation (Non-Medical): No  Physical Activity: Insufficiently Active (06/26/2021)  Exercise Vital Sign    Days of Exercise per Week: 6 days    Minutes of Exercise per Session: 20 min  Stress: No Stress Concern Present (06/26/2021)   [place]    Feeling of Stress : Not at all  Social Connections: Socially Isolated (06/20/2020)   Social Connection and Isolation Panel [NHANES]    Frequency of Communication with Friends and Family: More than three times a week    Frequency of Social Gatherings with Friends and Family: More than three times a week    Attends Religious Services: Never    Marine scientist or Organizations: No    Attends Archivist Meetings: Never    Marital Status: Divorced  Human resources officer Violence: Not At Risk (06/20/2020)   Humiliation, Afraid, Rape, and Kick questionnaire    Fear of Current or Ex-Partner: No    Emotionally Abused: No    Physically Abused: No    Sexually Abused: No      PHYSICAL EXAM  There were no vitals filed for this visit.   There is no height or weight on file to calculate BMI.  General: well developed, well nourished,  very pleasant elderly Caucasian male, seated, in no evident distress   Neurologic Exam Mental Status: Awake and fully alert. Oriented to place and time. Recent and remote memory intact. Attention span, concentration and fund of knowledge appropriate. Mood and affect appropriate.  Cranial Nerves: Pupils equal, briskly reactive to light. Extraocular movements full without nystagmus. Visual fields full to confrontation. HOH bilaterally. Facial sensation intact. Face, tongue, palate moves normally and symmetrically.  Motor: Normal bulk and tone. Normal strength in all  tested extremity muscles. Sensory.: intact to touch , pinprick , position and vibratory sensation.  Coordination: Rapid alternating movements normal in all extremities. Finger-to-nose and heel-to-shin performed accurately bilaterally. Gait and Station: Arises from chair without difficulty. Stance is hunched. Gait demonstrates normal stride length and balance with use of rolling walker Reflexes: 1+ and symmetric. Toes downgoing.     DIAGNOSTIC DATA (LABS, IMAGING, TESTING) - I reviewed patient records, labs, notes, testing and imaging myself where available.  Lab Results  Component Value Date   WBC 5.5 05/23/2021   HGB 13.2 05/23/2021   HCT 40.2 05/23/2021   MCV 97.5 05/23/2021   PLT 193.0 05/23/2021      Component Value Date/Time   NA 138 05/23/2021 0826   NA 137 02/21/2021 0000   K 4.1 05/23/2021 0826   CL 100 05/23/2021 0826   CO2 30 05/23/2021 0826   GLUCOSE 99 05/23/2021 0826   BUN 7 05/23/2021 0826   BUN 5 (L) 02/21/2021 0000   CREATININE 1.10 05/23/2021 0826   CREATININE 1.16 02/26/2020 0817   CALCIUM 9.4 05/23/2021 0826   PROT 6.4 05/23/2021 0826   PROT 6.3 02/21/2021 0000   ALBUMIN 4.3 05/23/2021 0826   ALBUMIN 4.1 02/21/2021 0000   AST 16 05/23/2021 0826   ALT 12 05/23/2021 0826   ALKPHOS 92 05/23/2021 0826   BILITOT 0.6 05/23/2021 0826   BILITOT 0.2 02/21/2021 0000   GFRNONAA 62 02/26/2020 0817   GFRAA 71 02/26/2020 0817   Lab Results  Component Value Date   CHOL 144 05/23/2021   HDL 63.10 05/23/2021   LDLCALC 60 05/23/2021   TRIG 101.0 05/23/2021   CHOLHDL 2 05/23/2021   Lab Results  Component Value Date   HGBA1C 5.4 03/20/2013   No results found for: "VITAMINB12" Lab  Results  Component Value Date   TSH 1.21 05/23/2021      ASSESSMENT AND PLAN 76 y.o. year old male  has a past medical history of Acute and subacute bacterial endocarditis (1999), Atrial fibrillation ([place]) (11/11/2008), CARDIOMYOPATHY (10/20/2008), Cerebrovascular disease,  unspecified, Closed right acetabular fracture ([place]), COPD (chronic obstructive pulmonary disease) ([place]) (1999), Epidural hematoma (03/01/2011), Esophageal reflux, Generalized osteoarthrosis, unspecified site, History of tobacco abuse, Hypertension, HYPERTENSION, BENIGN (10/20/2008), Hyponatremia, Insomnia, Iron deficiency anemia, unspecified, ISCHEMIC COLITIS, HX OF (10/20/2008), Lung nodules (2008, 2014), Prostate cancer ([place]) (2015), Protruding sternal wires, S/P mitral valve replacement (02/01/1998), S/P mitral valve replacement with bioprosthetic valve (03/24/2013), Seizures ([place]), Severe mitral regurgitation (02/20/2013), and Unsteady gait. here with:  1.  Seizures 2.  History of stroke   Overall the patient has done well.  He will continue on Keppra and Dilantin for seizure prophylaxis - will provide refills.  Phenytoin level 8.5 (01/2021).  Repeat today.  He remains on Coumadin for stroke prevention and history of atrial fibrillation followed by Coumadin clinic.     Follow-up in 1 year or call earlier as needed   CC:  [facility] provider: Dr. Kathleen Lime, Tommi Rumps, NP     I spent 23 minutes of face-to-face and non-face-to-face time with patient.  This included previsit chart review, lab review, study review, order entry, electronic health record documentation, patient education and discussion regarding history of seizures and stroke, ongoing compliance with prescribed medication indication for lab work and answered all other questions to patient's satisfaction   Frann Rider, [facility]  [facility] [address] [place], [place] [address]  Phone [phone] Fax [phone] Note: This document was prepared with digital dictation and possible smart phrase technology. Any transcriptional errors that result from this process are unintentional.

## 2022-02-22 ENCOUNTER — Ambulatory Visit (INDEPENDENT_AMBULATORY_CARE_PROVIDER_SITE_OTHER): Payer: Medicare HMO | Admitting: Adult Health

## 2022-02-22 ENCOUNTER — Encounter: Payer: Self-pay | Admitting: Adult Health

## 2022-02-22 VITALS — BP 137/71 | HR 70 | Ht 63.0 in | Wt 132.0 lb

## 2022-02-22 DIAGNOSIS — Z8673 Personal history of transient ischemic attack (TIA), and cerebral infarction without residual deficits: Secondary | ICD-10-CM

## 2022-02-22 DIAGNOSIS — R569 Unspecified convulsions: Secondary | ICD-10-CM | POA: Diagnosis not present

## 2022-02-22 DIAGNOSIS — Z5181 Encounter for therapeutic drug level monitoring: Secondary | ICD-10-CM

## 2022-02-22 NOTE — Patient Instructions (Signed)
No changes today -continue current dose of Keppra and Dilantin, we will check Dilantin levels today  Continue to follow closely with your PCP and cardiology for aggressive stroke risk factor management as well as ongoing use of warfarin and Zetia     Follow-up in 1 year or call earlier.

## 2022-02-23 LAB — PHENYTOIN LEVEL, TOTAL: Phenytoin (Dilantin), Serum: 11.1 ug/mL (ref 10.0–20.0)

## 2022-02-26 ENCOUNTER — Telehealth: Payer: Self-pay

## 2022-02-26 NOTE — Telephone Encounter (Signed)
-----   Message from Frann Rider, NP sent at 02/26/2022  7:08 AM EDT ----- Please advise patient that recent phenytoin level was within normal limits.  Please continue current phenytoin (Dilantin) dosage at this time.

## 2022-02-26 NOTE — Telephone Encounter (Signed)
Called pt and notified him about his phenytoin levels.Had no questions or concerns. He thanked me and appreciated the call.

## 2022-03-05 ENCOUNTER — Other Ambulatory Visit: Payer: Self-pay | Admitting: Adult Health

## 2022-03-05 DIAGNOSIS — Z760 Encounter for issue of repeat prescription: Secondary | ICD-10-CM

## 2022-03-08 ENCOUNTER — Encounter (HOSPITAL_COMMUNITY): Payer: Medicare HMO

## 2022-03-09 ENCOUNTER — Ambulatory Visit (HOSPITAL_COMMUNITY)
Admission: RE | Admit: 2022-03-09 | Discharge: 2022-03-09 | Disposition: A | Discharge status: Home / Self Care | Payer: Medicare HMO | Source: Ambulatory Visit | Attending: Cardiovascular Disease | Admitting: Cardiovascular Disease

## 2022-03-09 ENCOUNTER — Ambulatory Visit (INDEPENDENT_AMBULATORY_CARE_PROVIDER_SITE_OTHER): Payer: Medicare HMO

## 2022-03-09 DIAGNOSIS — Z5181 Encounter for therapeutic drug level monitoring: Secondary | ICD-10-CM | POA: Diagnosis not present

## 2022-03-09 DIAGNOSIS — I779 Disorder of arteries and arterioles, unspecified: Secondary | ICD-10-CM | POA: Diagnosis not present

## 2022-03-09 DIAGNOSIS — I6523 Occlusion and stenosis of bilateral carotid arteries: Secondary | ICD-10-CM

## 2022-03-09 DIAGNOSIS — I4891 Unspecified atrial fibrillation: Secondary | ICD-10-CM

## 2022-03-09 LAB — POCT INR: INR: 3.5 — AB (ref 2.0–3.0)

## 2022-03-09 NOTE — Patient Instructions (Signed)
Continue taking Warfarin 1/2 tablet daily except for 1 tablet on Tuesdays and Saturdays. Recheck INR in 6 weeks.   Coumadin Clinic [phone].

## 2022-04-06 ENCOUNTER — Other Ambulatory Visit: Payer: Self-pay | Admitting: Adult Health

## 2022-04-20 ENCOUNTER — Ambulatory Visit: Payer: Medicare HMO | Attending: Cardiology | Admitting: *Deleted

## 2022-04-20 DIAGNOSIS — I4891 Unspecified atrial fibrillation: Secondary | ICD-10-CM

## 2022-04-20 DIAGNOSIS — Z5181 Encounter for therapeutic drug level monitoring: Secondary | ICD-10-CM

## 2022-04-20 LAB — PROTIME-INR
INR: >10 (ref 0.9–1.2)
Prothrombin Time: 90.3 s — ABNORMAL HIGH (ref 9.1–12.0)

## 2022-04-20 LAB — POCT INR: INR: 8 — AB (ref 2.0–3.0)

## 2022-04-20 NOTE — Patient Instructions (Addendum)
Description   Spoke with pt instructed and advised pt not to take any Warfarin today, no warfarin tomorrow, no warfarin Sunday, no warfarin Monday. Have leafy veggies today and throughout weekend. Report to ER with any bleeding, falls, accidents. Recheck INR on Monday.   Normal dose: Warfarin 1/2 tablet daily except for 1 tablet on Tuesdays and Saturdays.  Coumadin Clinic [phone] or [phone].

## 2022-04-23 ENCOUNTER — Ambulatory Visit: Payer: Medicare HMO | Attending: Cardiology | Admitting: *Deleted

## 2022-04-23 DIAGNOSIS — Z5181 Encounter for therapeutic drug level monitoring: Secondary | ICD-10-CM

## 2022-04-23 DIAGNOSIS — I4891 Unspecified atrial fibrillation: Secondary | ICD-10-CM

## 2022-04-23 LAB — POCT INR: INR: 4.2 — AB (ref 2.0–3.0)

## 2022-04-23 NOTE — Patient Instructions (Addendum)
Description   Do not take any warfarin today then start taking Warfarin 1/2 tablet daily except for 1 tablet on Tuesdays. Recheck INR in 10 days.  Coumadin Clinic [phone] or [phone].

## 2022-05-03 ENCOUNTER — Ambulatory Visit: Payer: Medicare HMO | Attending: Cardiology | Admitting: *Deleted

## 2022-05-03 DIAGNOSIS — Z5181 Encounter for therapeutic drug level monitoring: Secondary | ICD-10-CM

## 2022-05-03 DIAGNOSIS — I4891 Unspecified atrial fibrillation: Secondary | ICD-10-CM | POA: Diagnosis not present

## 2022-05-03 LAB — POCT INR: INR: 2.5 (ref 2.0–3.0)

## 2022-05-03 NOTE — Patient Instructions (Signed)
Description   Continue taking the dose you been taking: Warfarin 1/2 tablet daily except for 1 tablet on Tuesdays and Saturdays. Recheck INR in 2 weeks. Coumadin Clinic [phone] or [phone].

## 2022-05-17 ENCOUNTER — Ambulatory Visit: Payer: Medicare HMO | Attending: Cardiology

## 2022-05-17 DIAGNOSIS — Z5181 Encounter for therapeutic drug level monitoring: Secondary | ICD-10-CM | POA: Diagnosis not present

## 2022-05-17 DIAGNOSIS — I4891 Unspecified atrial fibrillation: Secondary | ICD-10-CM

## 2022-05-17 LAB — POCT INR: INR: 5.7 — AB (ref 2.0–3.0)

## 2022-05-17 NOTE — Patient Instructions (Signed)
HOLD TODAY, FRIDAY and SATURDAY then Continue taking the dose you been taking: Warfarin 1/2 tablet daily except for 1 tablet on Tuesdays and Saturdays. Recheck INR in 1 weeks. Coumadin Clinic [phone] or [phone].

## 2022-05-23 ENCOUNTER — Ambulatory Visit: Payer: Medicare HMO | Attending: Cardiology | Admitting: *Deleted

## 2022-05-23 DIAGNOSIS — I4891 Unspecified atrial fibrillation: Secondary | ICD-10-CM

## 2022-05-23 DIAGNOSIS — Z5181 Encounter for therapeutic drug level monitoring: Secondary | ICD-10-CM

## 2022-05-23 LAB — POCT INR: INR: 1.3 — AB (ref 2.0–3.0)

## 2022-05-23 NOTE — Patient Instructions (Signed)
Description   Today take 1 tablet and tomorrow take 1 tablet then continue taking Warfarin 1/2 tablet daily except for 1 tablet on Tuesdays and Saturdays. Recheck INR in 1 week. Coumadin Clinic [phone] or [phone].

## 2022-06-01 ENCOUNTER — Ambulatory Visit: Payer: Medicare HMO | Attending: Cardiovascular Disease

## 2022-06-01 DIAGNOSIS — I4891 Unspecified atrial fibrillation: Secondary | ICD-10-CM | POA: Diagnosis not present

## 2022-06-01 DIAGNOSIS — Z5181 Encounter for therapeutic drug level monitoring: Secondary | ICD-10-CM

## 2022-06-01 LAB — POCT INR: INR: 2.8 (ref 2.0–3.0)

## 2022-06-01 NOTE — Patient Instructions (Signed)
continue taking Warfarin 1/2 tablet daily except for 1 tablet on Tuesdays and Saturdays. Recheck INR in 2 weeks. Coumadin Clinic [phone] or [phone].

## 2022-06-06 ENCOUNTER — Encounter: Payer: Self-pay | Admitting: Cardiology

## 2022-06-06 ENCOUNTER — Ambulatory Visit: Payer: Medicare HMO | Attending: Cardiology | Admitting: Cardiology

## 2022-06-06 VITALS — BP 122/74 | HR 73 | Ht 63.0 in | Wt 136.6 lb

## 2022-06-06 DIAGNOSIS — I4891 Unspecified atrial fibrillation: Secondary | ICD-10-CM | POA: Diagnosis not present

## 2022-06-06 DIAGNOSIS — J449 Chronic obstructive pulmonary disease, unspecified: Secondary | ICD-10-CM | POA: Diagnosis not present

## 2022-06-06 DIAGNOSIS — Z953 Presence of xenogenic heart valve: Secondary | ICD-10-CM | POA: Diagnosis not present

## 2022-06-06 DIAGNOSIS — I5032 Chronic diastolic (congestive) heart failure: Secondary | ICD-10-CM | POA: Diagnosis not present

## 2022-06-06 DIAGNOSIS — G40909 Epilepsy, unspecified, not intractable, without status epilepticus: Secondary | ICD-10-CM | POA: Diagnosis not present

## 2022-06-06 NOTE — Patient Instructions (Signed)
Medication Instructions:  Your physician recommends that you continue on your current medications as directed. Please refer to the Current Medication list given to you today.  *If you need a refill on your cardiac medications before your next appointment, please call your pharmacy*   Testing/Procedures: ECHO Your physician has requested that you have an echocardiogram. Echocardiography is a painless test that uses sound waves to create images of your heart. It provides your doctor with information about the size and shape of your heart and how well your heart's chambers and valves are working. This procedure takes approximately one hour. There are no restrictions for this procedure. Please do NOT wear cologne, perfume, aftershave, or lotions (deodorant is allowed). Please arrive 15 minutes prior to your appointment time.   Follow-Up: At [facility], you and your health needs are our priority.  As part of our continuing mission to provide you with exceptional heart care, we have created designated Provider Care Teams.  These Care Teams include your primary Cardiologist (physician) and Advanced Practice Providers (APPs -  Physician Assistants and Nurse Practitioners) who all work together to provide you with the care you need, when you need it.  We recommend signing up for the patient portal called "MyChart".  Sign up information is provided on this After Visit Summary.  MyChart is used to connect with patients for Virtual Visits (Telemedicine).  Patients are able to view lab/test results, encounter notes, upcoming appointments, etc.  Non-urgent messages can be sent to your provider as well.   To learn more about what you can do with MyChart, go to [email or web address].    Your next appointment:   1 year(s)  The format for your next appointment:   In Person  Provider:  Dr. Marlou Porch    Important Information About Sugar

## 2022-06-06 NOTE — Progress Notes (Signed)
Cardiology Office Note:    Date:  06/06/2022   ID:  Jeffery Copeland, DOB January 28, 1946, MRN [id number]  PCP:  No primary care provider on file.   [facility]  Cardiologist:  Candee Furbish, MD  Advanced Practice Provider:  No care team member to display Electrophysiologist:  None       Referring MD: Dorothyann Peng, NP    History of Present Illness:    Jeffery Copeland is a 76 y.o. male here for the follow-up of permanent atrial fibrillation on Coumadin, prior history of endocarditis with mitral valve replacement bioprosthetic in 1999 with redo valve in 2014.  Also has carotid stenosis moderate 59%.  In December 2019 had a protruding sternal wire that was taken care of by Dr. Roxy Manns.  Overall he had mild shortness of breath with activity which is no change from normal for him given his COPD.  Felt well.  Denied any fevers chills nausea vomiting syncope bleeding.  Today, he says he is doing alright.   He states he has had some exertional shortness of breath. He feels the need to rest every now and then but does not become overly fatigued.   He denies any palpitations, chest pain, or peripheral edema. No lightheadedness, headaches, syncope, orthopnea, or PND.  Past Medical History:  Diagnosis Date   Acute and subacute bacterial endocarditis 1999   group G Streptococcus   Atrial fibrillation ([place]) 11/11/2008   Qualifier: Diagnosis of  By: Lia Foyer, MD, Jaquelyn Bitter    CARDIOMYOPATHY 10/20/2008   Qualifier: Diagnosis of  By: Owens Shark, RN, BSN, Lauren     Cerebrovascular disease, unspecified    Closed right acetabular fracture ([place])    COPD (chronic obstructive pulmonary disease) ([place]) 1999   Epidural hematoma ([place]) 03/01/2011   Recent fall 2012    Esophageal reflux    Generalized osteoarthrosis, unspecified site    History of tobacco abuse    Hypertension    HYPERTENSION, BENIGN 10/20/2008   Qualifier: Diagnosis of  By: Owens Shark, RN, BSN, Lauren     Hyponatremia     Insomnia    Iron deficiency anemia, unspecified    ISCHEMIC COLITIS, HX OF 10/20/2008   Qualifier: Diagnosis of  By: Owens Shark, RN, BSN, Lauren     Lung nodules 2008, 2014   LLL nodule removed 2008 with LLL lobectomy,  RLL nodule 55m observation   [place]state cancer [place]   Protruding sternal wires    S/P mitral valve replacement 02/01/1998   Carpentier-Edwards porcine bioprosthetic tissue valve, size 362mPlaced for acute and subacute bacterial endocarditis (group G Streptococcus) with pre-existing mitral valve prolapse    S/P mitral valve replacement with bioprosthetic valve 03/24/2013   Redo mitral valve replacement using [id number] bioprosthetic tissue valve performed via right mini thoracotomy   [place]iz[place]  last seizure 1 year ago   Severe mitral regurgitation 02/20/2013   Unsteady gait     Past Surgical History:  Procedure Laterality Date   CARDIAC VALVE REPLACEMENT     INTRAOPERATIVE TRANSESOPHAGEAL ECHOCARDIOGRAM N/A 03/24/2013   Procedure: INTRAOPERATIVE TRANSESOPHAGEAL ECHOCARDIOGRAM;  Surgeon: ClaRexene AlbertsD;  [facility];  Laterality: N/A;   MINIMALLY INVASIVE MAZE PROCEDURE N/A 03/24/2013   Procedure: MINIMALLY INVASIVE MAZE PROCEDURE;  Surgeon: ClaRexene AlbertsD;  [facility];  Laterality: N/A;   MITRAL VALVE REPLACEMENT  02/01/1998   Carpentier-Edwards porcine bioprosthetic  tissue valve, size 71m, placed for complicated bacterial endocarditis   MITRAL VALVE REPLACEMENT Right 03/24/2013   Procedure: MINIMALLY INVASIVE REDO MITRAL VALVE (MV) REPLACEMENT;  Surgeon: CRexene Alberts MD;  [place]  Service: Open Heart Surgery;  Laterality: Right;   PROSTATE BIOPSY     radiation treatment tx for prostate cancer  2015   STERNAL WIRES REMOVAL N/A 07/10/2018   Procedure: STERNAL WIRES REMOVAL;  Surgeon: ORexene Alberts MD;  [place] [facility]  Service: Thoracic;  Laterality:  N/A;   TEE WITHOUT CARDIOVERSION N/A 02/12/2013   Procedure: TRANSESOPHAGEAL ECHOCARDIOGRAM (TEE);  Surgeon: PFay Records MD;  [facility]  Service: Cardiovascular;  Laterality: N/A;   VIDEO ASSISTED THORACOSCOPY  04/29/2007   Left VATS w/ mini thoracotomy for Left Lower Lobectomy for benign lung nodules    Current Medications: Current Meds  Medication Sig   cholecalciferol (VITAMIN D3) 25 MCG (1000 UNIT) tablet Take 1,000 Units by mouth daily.   ezetimibe (ZETIA) 10 MG tablet TAKE 1 TABLET EVERY DAY   levETIRAcetam (KEPPRA) 500 MG tablet TAKE 1 TABLET 2 TIMES DAILY. MUST KEEP UPCOMING APPT FOR FURTHER REFILLS   metoprolol tartrate (LOPRESSOR) 25 MG tablet TAKE 1/2 TABLET TWICE DAILY   Multiple Vitamins-Minerals (PRESERVISION AREDS 2+MULTI VIT PO) Take 1 capsule by mouth daily.   phenytoin (DILANTIN) 100 MG ER capsule TAKE 3 CAPSULES BY MOUTH AT BEDTIME.   tamsulosin (FLOMAX) 0.4 MG CAPS capsule Take 0.4 mg by mouth.   tiotropium (SPIRIVA HANDIHALER) 18 MCG inhalation capsule INHALE THE CONTENTS OF 1 CAPSULE EVERY DAY VIA HANDIHALER (NEED MD APPOINTMENT FOR REFILLS)   tiZANidine (ZANAFLEX) 4 MG tablet TAKE 1 TABLET EVERY 6 HOURS AS NEEDED FOR MUSCLE SPASMS   traMADol (ULTRAM) 50 MG tablet TAKE 1 TABLET EVERY 8 HOURS AS NEEDED   warfarin (COUMADIN) 6 MG tablet TAKE 1/2 TO 1 TABLET ONE TIME DAILY AS DIRECTED BY COUMADIN CLINIC     Allergies:   Ace inhibitors, Lisinopril, and Rosuvastatin   Social History   Socioeconomic History   Marital status: Divorced    Spouse name: Not on file   Number of children: 2   Years of education: 9   Highest education level: Not on file  Occupational History   Occupation: disabled  Tobacco Use   Smoking status: Former    Packs/day: 2.00    Years: 45.00    Total pack years: 90.00    Types: Cigarettes    Quit date: 07/30/2006    Years since quitting: 15.8   Smokeless tobacco: Never  Vaping Use   Vaping Use: Never used  Substance and  Sexual Activity   Alcohol use: No    Alcohol/week: 0.0 standard drinks of alcohol    Comment: previous alcohol use & home-made alcohol consumption   Drug use: No   Sexual activity: Not Currently  Other Topics Concern   Not on file  Social History Narrative   Patient is divorced and has 2 children.   Patient is right handed.   Patient has 9 th grade education.    Patient drinks diet sodas.      [place] [facility]:   From [place] originally. [place] He worked in cNurse, learning disabilitydid roofing. Questionable asbestos exposure. [place] pets currently. Remote pet owl and parakeet exposure. No mold exposure. Enjoys fishing.    Social Determinants of Health   Financial Resource Strain: Low Risk  (06/26/2021)   Overall  Financial Resource Strain (CARDIA)    Difficulty of Paying Living Expenses: Not hard at all  Food Insecurity: No Food Insecurity (06/26/2021)   Hunger Vital Sign    Worried About Running Out of Food in the Last Year: Never true    Ran Out of Food in the Last Year: Never true  Transportation Needs: No Transportation Needs (06/26/2021)   PRAPARE - Hydrologist (Medical): No    Lack of Transportation (Non-Medical): No  Physical Activity: Insufficiently Active (06/26/2021)   Exercise Vital Sign    Days of Exercise per Week: 6 days    Minutes of Exercise per Session: 20 min  Stress: No Stress Concern Present (06/26/2021)   [place]    Feeling of Stress : Not at all  Social Connections: Socially Isolated (06/20/2020)   Social Connection and Isolation Panel [NHANES]    Frequency of Communication with Friends and Family: More than three times a week    Frequency of Social Gatherings with Friends and Family: More than three times a week    Attends Religious Services: Never    Marine scientist or Organizations: No    Attends Programme researcher, broadcasting/film/video: Never    Marital Status: Divorced     Family History: The patient's family history includes Arthritis in his mother; Cancer in his brother and father; Heart attack in his father; Tuberculosis in his father. There is no history of Lung disease.  ROS:   Please see the history of present illness.   (+) Exertional shortness of breath  All other systems reviewed and are negative.  EKGs/Labs/Other Studies Reviewed:    The following studies were reviewed today:  Carotid Doppler 03/09/2022: Summary:  Right Carotid: Velocities in the right ICA are consistent with a 1-39%  stenosis.  Non-hemodynamically significant plaque <50% noted in the CCA.   Left Carotid: Velocities in the left ICA are consistent with a 1-39%  stenosis.  Non-hemodynamically significant plaque <50% noted in the CCA.   Vertebrals: Bilateral vertebral arteries demonstrate antegrade flow.  Subclavians: Normal flow hemodynamics were seen in bilateral subclavian arteries.   *See table(s) above for measurements and observations.   Carotid Doppler 03/08/20:  Summary:  Right Carotid: Velocities in the right ICA are consistent with a 1-39%  stenosis.  Non-hemodynamically significant plaque <50% noted in the CCA.   Left Carotid: Velocities in the left ICA are consistent with a 1-39%  stenosis.  Non-hemodynamically significant plaque <50% noted in the CCA.   Vertebrals:  Bilateral vertebral arteries demonstrate antegrade flow.  Subclavians: Normal flow hemodynamics were seen in bilateral subclavian arteries.   *See table(s) above for measurements and observations.  Suggest follow up study in 12 months. Stable from prior.   ECHO 03/07/18: - Left ventricle: The cavity size was normal. Wall thickness was   normal. Systolic function was normal. The estimated ejection   fraction was in the range of 55% to 60%. Wall motion was normal;   there were no regional wall motion abnormalities. - Aortic valve:  There was trivial regurgitation. - Aortic root: The aortic root was mildly dilated. - Mitral valve: A bioprosthesis was present. - Left atrium: The atrium was severely dilated. - Pulmonary arteries: Systolic pressure was moderately increased.   PA peak pressure: 48 mm Hg (S).   Impressions:   - Normal LV systolic function; trace AI; mildly dilated aortic   root; s/p MVR with normal mean  gradient of 3 mmHg; severe LAE;   mild TR with moderate pulmonary hypertension.    EKG:  EKG is personally reviewed. 06/06/22: Atrial fibrillation. Rate 73 bpm. Nonspecific T wave changes. 11/03/20: Atrial flutter type underlying rhythm with slight beat to beat variability with QRS and a atrial bigeminy type pattern.  Heart rate 71 bpm.  Recent Labs: No results found for requested labs within last 365 days.  Recent Lipid Panel    Component Value Date/Time   CHOL 144 05/23/2021 0826   CHOL 149 02/21/2021 0000   TRIG 101.0 05/23/2021 0826   HDL 63.10 05/23/2021 0826   HDL 67 02/21/2021 0000   CHOLHDL 2 05/23/2021 0826   VLDL 20.2 05/23/2021 0826   LDLCALC 60 05/23/2021 0826   LDLCALC 69 02/21/2021 0000   LDLCALC 56 02/26/2020 0817     Risk Assessment/Calculations:      Physical Exam:    VS:  BP 122/74   Pulse 73   Ht '5\' 3"'$  (1.6 m)   Wt 136 lb 9.6 oz (62 kg)   SpO2 92%   BMI 24.20 kg/m     Wt Readings from Last 3 Encounters:  06/06/22 136 lb 9.6 oz (62 kg)  02/22/22 132 lb (59.9 kg)  06/26/21 135 lb (61.2 kg)     GEN:  Well nourished, well developed in no acute distress, uses a walker HEENT: Normal NECK: No JVD; No carotid bruits LYMPHATICS: No lymphadenopathy CARDIAC: Reg irreg, no murmurs, rubs, gallops RESPIRATORY:  Clear to auscultation without rales, wheezing or rhonchi  ABDOMEN: Soft, non-tender, non-distended MUSCULOSKELETAL:  No edema; No deformity  SKIN: Warm and dry NEUROLOGIC:  Alert and oriented x 3 PSYCHIATRIC:  Normal affect   ASSESSMENT:    1. Permanent  atrial fibrillation ([place])   2. Bilateral carotid artery stenosis    PLAN:    In order of problems listed above:  Mitral valve replacement x2 -1999, 2014-prior endocarditis.  Continue with dental prophylaxis however he is edentulous.  Echocardiogram as above shows stable bioprosthesis. Cow and Pig both he states.  We will go ahead and check an echocardiogram to assess mitral valve function.  It has been since 2019.  Prior ejection fraction was normal.  Permanent atrial fibrillation -Remains on Coumadin for chronic anticoagulation doing well with good rate control.  Continue to monitor hemoglobin and INRs.  This is being performed at our clinic.  Doing very well.  Recently had some issues maintaining his INR but he is back on track.  Chronic diastolic heart failure -Euvolemic, doing well.  No signs of fluid overload.  COPD -Pulmonary medicine is followed in the past.  Stable.  Short of breath at baseline.  Carotid artery disease -Moderate.  See above.  No change from prior.  Repeating yearly.  Overall stable as above.  Seizure disorder -No recent seizures, medications reviewed and followed by neurology.  No changes  Hyperlipidemia -LDL previously 51.  Currently on Zetia 10.  Doing well.  Most recent LDL 56, TSH 1.5 creatinine 1.16  Follow up: 1 year.   Medication Adjustments/Labs and Tests Ordered: Current medicines are reviewed at length with the patient today.  Concerns regarding medicines are outlined above.   Orders Placed This Encounter  Procedures   EKG 12-Lead   ECHOCARDIOGRAM COMPLETE   No orders of the defined types were placed in this encounter.   Patient Instructions  Medication Instructions:  Your physician recommends that you continue on your current medications as directed. Please refer to the  Current Medication list given to you today.  *If you need a refill on your cardiac medications before your next appointment, please call your  pharmacy*   Testing/Procedures: ECHO Your physician has requested that you have an echocardiogram. Echocardiography is a painless test that uses sound waves to create images of your heart. It provides your doctor with information about the size and shape of your heart and how well your heart's chambers and valves are working. This procedure takes approximately one hour. There are no restrictions for this procedure. Please do NOT wear cologne, perfume, aftershave, or lotions (deodorant is allowed). Please arrive 15 minutes prior to your appointment time.   Follow-Up: At [facility], you and your health needs are our priority.  As part of our continuing mission to provide you with exceptional heart care, we have created designated Provider Care Teams.  These Care Teams include your primary Cardiologist (physician) and Advanced Practice Providers (APPs -  Physician Assistants and Nurse Practitioners) who all work together to provide you with the care you need, when you need it.  We recommend signing up for the patient portal called "MyChart".  Sign up information is provided on this After Visit Summary.  MyChart is used to connect with patients for Virtual Visits (Telemedicine).  Patients are able to view lab/test results, encounter notes, upcoming appointments, etc.  Non-urgent messages can be sent to your provider as well.   To learn more about what you can do with MyChart, go to [email or web address].    Your next appointment:   1 year(s)  The format for your next appointment:   In Person  Provider:  Dr. Marlou Porch    Important Information About Sugar          I,Breanna Adamick,acting as a scribe for Candee Furbish, MD.,have documented all relevant documentation on the behalf of Candee Furbish, MD,as directed by  Candee Furbish, MD while in the presence of Candee Furbish, MD.  I, Candee Furbish, MD, have reviewed all documentation for this visit. The documentation on 06/06/22 for the  exam, diagnosis, procedures, and orders are all accurate and complete.   Signed, Candee Furbish, MD  06/06/2022 9:05 AM    [place]

## 2022-06-13 ENCOUNTER — Ambulatory Visit: Payer: Medicare HMO | Attending: Internal Medicine | Admitting: *Deleted

## 2022-06-13 DIAGNOSIS — Z5181 Encounter for therapeutic drug level monitoring: Secondary | ICD-10-CM | POA: Diagnosis not present

## 2022-06-13 DIAGNOSIS — I4891 Unspecified atrial fibrillation: Secondary | ICD-10-CM | POA: Diagnosis not present

## 2022-06-13 LAB — POCT INR: INR: 3.1 — AB (ref 2.0–3.0)

## 2022-06-13 NOTE — Patient Instructions (Signed)
Description   Continue taking Warfarin 1/2 tablet daily except for 1 tablet on Tuesdays and Saturdays. Recheck INR in 4 weeks. Coumadin Clinic [phone] or [phone].

## 2022-06-15 DIAGNOSIS — H43811 Vitreous degeneration, right eye: Secondary | ICD-10-CM | POA: Diagnosis not present

## 2022-06-15 DIAGNOSIS — H353221 Exudative age-related macular degeneration, left eye, with active choroidal neovascularization: Secondary | ICD-10-CM | POA: Diagnosis not present

## 2022-06-15 DIAGNOSIS — H43822 Vitreomacular adhesion, left eye: Secondary | ICD-10-CM | POA: Diagnosis not present

## 2022-06-15 DIAGNOSIS — H35361 Drusen (degenerative) of macula, right eye: Secondary | ICD-10-CM | POA: Diagnosis not present

## 2022-06-18 ENCOUNTER — Other Ambulatory Visit: Payer: Self-pay | Admitting: Adult Health

## 2022-06-19 NOTE — Telephone Encounter (Signed)
Patient need to schedule an ov for more refills. 

## 2022-06-20 ENCOUNTER — Telehealth: Payer: Self-pay | Admitting: Adult Health

## 2022-06-20 MED ORDER — METOPROLOL TARTRATE 25 MG PO TABS: 12.5000 mg | ORAL_TABLET | Freq: Two times a day (BID) | ORAL | 0 refills | Status: DC

## 2022-06-20 NOTE — Telephone Encounter (Signed)
Left message to return phone call.

## 2022-06-20 NOTE — Telephone Encounter (Signed)
Tried to call pt to advise of update no answer.

## 2022-06-20 NOTE — Telephone Encounter (Signed)
Pt has cpe sch for jan 2024 and would like a refill on metoprolol tartrate (LOPRESSOR) 25 MG tablet  [place], [place] Phone: [phone]  Fax: [phone]

## 2022-06-20 NOTE — Telephone Encounter (Signed)
Rx refilled.

## 2022-07-03 ENCOUNTER — Ambulatory Visit: Payer: Medicare HMO

## 2022-07-04 ENCOUNTER — Ambulatory Visit (HOSPITAL_COMMUNITY): Payer: Medicare HMO | Attending: Cardiovascular Disease

## 2022-07-04 DIAGNOSIS — I4891 Unspecified atrial fibrillation: Secondary | ICD-10-CM | POA: Diagnosis not present

## 2022-07-04 LAB — ECHOCARDIOGRAM COMPLETE
Area-P 1/2: 2.39 cm2
MV VTI: 1.14 cm2
S' Lateral: 3.2 cm

## 2022-07-05 ENCOUNTER — Ambulatory Visit (INDEPENDENT_AMBULATORY_CARE_PROVIDER_SITE_OTHER): Payer: Medicare HMO

## 2022-07-05 VITALS — BP 120/60 | HR 91 | Temp 98.3°F | Ht 63.0 in | Wt 147.7 lb

## 2022-07-05 DIAGNOSIS — Z0000 Encounter for general adult medical examination without abnormal findings: Secondary | ICD-10-CM | POA: Diagnosis not present

## 2022-07-05 NOTE — Progress Notes (Signed)
Subjective:   Jeffery Copeland is a 76 y.o. male who presents for Medicare Annual/Subsequent preventive examination.  Review of Systems      Cardiac Risk Factors include: advanced age (>70mn, >>17women);male gender;hypertension     Objective:    Today's Vitals   07/05/22 1021  BP: 120/60  Pulse: 91  Temp: 98.3 F (36.8 C)  TempSrc: Oral  SpO2: 95%  Weight: 147 lb 11.2 oz (67 kg)  Height: '5\' 3"'$  (1.6 m)   Body mass index is 26.16 kg/m.     07/05/2022   10:38 AM 06/26/2021    8:21 AM 06/20/2020    1:32 PM 04/09/2017    3:25 PM 02/03/2016   11:12 AM 01/04/2016   12:24 PM 12/28/2015    9:00 PM  Advanced Directives  Does Patient Have a Medical Advance Directive? Yes Yes Yes No No Yes No  Type of AParamedicof AUpper [place] will Out of facility DNR (pink MOST or yellow form) [place] will   Out of facility DNR (pink MOST or yellow form)   Does patient want to make changes to medical advance directive? No - Patient declined  No - Patient declined   No - Patient declined   Copy of [place]in Chart? Yes - validated most recent copy scanned in chart (See row information)  Yes - validated most recent copy scanned in chart (See row information)   Yes   Would patient like information on creating a medical advance directive?    No - Patient declined   No - patient declined information    Current Medications (verified) Outpatient Encounter Medications as of 07/05/2022  Medication Sig   cholecalciferol (VITAMIN D3) 25 MCG (1000 UNIT) tablet Take 1,000 Units by mouth daily.   ezetimibe (ZETIA) 10 MG tablet TAKE 1 TABLET EVERY DAY   levETIRAcetam (KEPPRA) 500 MG tablet TAKE 1 TABLET 2 TIMES DAILY. MUST KEEP UPCOMING APPT FOR FURTHER REFILLS   metoprolol tartrate (LOPRESSOR) 25 MG tablet Take 0.5 tablets (12.5 mg total) by mouth 2 (two) times daily.   Multiple Vitamins-Minerals (PRESERVISION AREDS 2+MULTI VIT PO) Take 1  capsule by mouth daily.   phenytoin (DILANTIN) 100 MG ER capsule TAKE 3 CAPSULES BY MOUTH AT BEDTIME.   tamsulosin (FLOMAX) 0.4 MG CAPS capsule Take 0.4 mg by mouth.   tiotropium (SPIRIVA HANDIHALER) 18 MCG inhalation capsule INHALE THE CONTENTS OF 1 CAPSULE EVERY DAY VIA HANDIHALER (NEED MD APPOINTMENT FOR REFILLS)   tiZANidine (ZANAFLEX) 4 MG tablet TAKE 1 TABLET EVERY 6 HOURS AS NEEDED FOR MUSCLE SPASMS   traMADol (ULTRAM) 50 MG tablet TAKE 1 TABLET EVERY 8 HOURS AS NEEDED   warfarin (COUMADIN) 6 MG tablet TAKE 1/2 TO 1 TABLET ONE TIME DAILY AS DIRECTED BY COUMADIN CLINIC   No facility-administered encounter medications on file as of 07/05/2022.    Allergies (verified) Ace inhibitors, Lisinopril, and Rosuvastatin   History: Past Medical History:  Diagnosis Date   Acute and subacute bacterial endocarditis 1999   group G Streptococcus   Atrial fibrillation ([place] 11/11/2008   Qualifier: Diagnosis of  By: SLia Foyer MD, FRadcliff TFenton3/24/2010   Qualifier: Diagnosis of  By: BOwens Shark RN, BSN, Lauren     Cerebrovascular disease, unspecified    Closed right acetabular fracture ([place]    COPD (chronic obstructive pulmonary disease) ([place] 1999   Epidural hematoma (HSection 03/01/2011   Recent fall 2012    Esophageal  reflux    Generalized osteoarthrosis, unspecified site    History of tobacco abuse    Hypertension    HYPERTENSION, BENIGN 10/20/2008   Qualifier: Diagnosis of  By: Owens Shark, RN, BSN, Lauren     Hyponatremia    Insomnia    Iron deficiency anemia, unspecified    ISCHEMIC COLITIS, HX OF 10/20/2008   Qualifier: Diagnosis of  By: Owens Shark, RN, BSN, Lauren     Lung nodules 2008, 2014   LLL nodule removed 2008 with LLL lobectomy,  RLL nodule 7m observation   Prostate cancer (HH[place] 2015   Protruding sternal wires    S/P mitral valve replacement 02/01/1998   Carpentier-Edwards porcine bioprosthetic tissue valve, size 321mPlaced for acute and subacute bacterial endocarditis  (group G Streptococcus) with pre-existing mitral valve prolapse    S/P mitral valve replacement with bioprosthetic valve 03/24/2013   Redo mitral valve replacement using [id number] [employer] [employer] bovine bioprosthetic tissue valve performed via right mini thoracotomy   Seizures (HCCJohnson  last seizure 1 year ago   Severe mitral regurgitation 02/20/2013   Unsteady gait    Past Surgical History:  Procedure Laterality Date   CARDIAC VALVE REPLACEMENT     INTRAOPERATIVE TRANSESOPHAGEAL ECHOCARDIOGRAM N/A 03/24/2013   Procedure: INTRAOPERATIVE TRANSESOPHAGEAL ECHOCARDIOGRAM;  Surgeon: ClaRexene AlbertsD;  [facility];  Laterality: N/A;   MINIMALLY INVASIVE MAZE PROCEDURE N/A 03/24/2013   Procedure: MINIMALLY INVASIVE MAZE PROCEDURE;  Surgeon: ClaRexene AlbertsD;  [facility];  Laterality: N/A;   MITRAL VALVE REPLACEMENT  02/01/1998   Carpentier-Edwards porcine bioprosthetic tissue valve, size 2m1mlaced for complicated bacterial endocarditis   MITRAL VALVE REPLACEMENT Right 03/24/2013   Procedure: MINIMALLY INVASIVE REDO MITRAL VALVE (MV) REPLACEMENT;  Surgeon: ClarRexene Alberts;  [facility];  Laterality: Right;   PROSTATE BIOPSY     radiation treatment tx for prostate cancer  2015   STERNAL WIRES REMOVAL N/A 07/10/2018   Procedure: STERNAL WIRES REMOVAL;  Surgeon: OwenRexene Alberts;  [facility];  Laterality: N/A;   TEE WITHOUT CARDIOVERSION N/A 02/12/2013   Procedure: TRANSESOPHAGEAL ECHOCARDIOGRAM (TEE);  Surgeon: PaulFay Records;  [facility];  Service: Cardiovascular;  Laterality: N/A;   VIDEO ASSISTED THORACOSCOPY  04/29/2007   Left VATS w/ mini thoracotomy for Left Lower Lobectomy for benign lung nodules   Family History  Problem Relation Age of Onset   Cancer Father        prostate cancer   Tuberculosis Father    Heart attack Father    Cancer  Brother        prostate cancer   Arthritis Mother    Lung disease Neg Hx    Social History   Socioeconomic History   Marital status: Divorced    Spouse name: Not on file   Number of children: 2   Years of education: 9   Highest education level: Not on file  Occupational History   Occupation: disabled  Tobacco Use   Smoking status: Former    Packs/day: 2.00    Years: 45.00    Total pack years: 90.00    Types: Cigarettes    Quit date: 07/30/2006    Years since quitting: 15.9   Smokeless tobacco: Never  Vaping Use   Vaping Use: Never used  Substance and Sexual Activity   Alcohol use: No  Alcohol/week: 0.0 standard drinks of alcohol    Comment: previous alcohol use & home-made alcohol consumption   Drug use: No   Sexual activity: Not Currently  Other Topics Concern   Not on file  Social History Narrative   Patient is divorced and has 2 children.   Patient is right handed.   Patient has 9 th grade education.    Patient drinks diet sodas.      [facility]:   From [place] originally. Always lived in [place]. 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 Previously traveled to [place], [place], & [place]. No pets currently. Remote pet owl and parakeet exposure. No mold exposure. Enjoys fishing.    Social Determinants of Health   Financial Resource Strain: Low Risk  (07/05/2022)   Overall Financial Resource Strain (CARDIA)    Difficulty of Paying Living Expenses: Not hard at all  Food Insecurity: No Food Insecurity (07/05/2022)   Hunger Vital Sign    Worried About Running Out of Food in the Last Year: Never true    Ran Out of Food in the Last Year: Never true  Transportation Needs: No Transportation Needs (07/05/2022)   PRAPARE - Hydrologist (Medical): No    Lack of Transportation (Non-Medical): No  Physical Activity: Inactive (07/05/2022)   Exercise Vital Sign    Days of Exercise per Week: 0 days    Minutes of Exercise per Session: 0  min  Stress: No Stress Concern Present (07/05/2022)   [place]    Feeling of Stress : Not at all  Social Connections: Socially Isolated (07/05/2022)   Social Connection and Isolation Panel [NHANES]    Frequency of Communication with Friends and Family: More than three times a week    Frequency of Social Gatherings with Friends and Family: More than three times a week    Attends Religious Services: Never    Marine scientist or Organizations: No    Attends Music therapist: Never    Marital Status: Divorced    Tobacco Counseling Counseling given: Not Answered   Clinical Intake:  Pre-visit preparation completed: No  Pain : No/denies pain     BMI - recorded: 26.16 Nutritional Status: BMI 25 -29 Overweight Nutritional Risks: None Diabetes: No  How often do you need to have someone help you when you read instructions, pamphlets, or other written materials from your doctor or pharmacy?: 3 - Sometimes (Family and Friends)  Diabetic?  No  Interpreter Needed?: No  Information entered by :: Rolene Arbour LPN   Activities of Daily Living    07/05/2022   10:36 AM  In your present state of health, do you have any difficulty performing the following activities:  Hearing? 0  Vision? 0  Difficulty concentrating or making decisions? 0  Walking or climbing stairs? 1  Comment Uses a walker  Dressing or bathing? 0  Doing errands, shopping? 0  Preparing Food and eating ? N  Using the Toilet? N  In the past six months, have you accidently leaked urine? N  Do you have problems with loss of bowel control? N  Managing your Medications? N  Managing your Finances? N  Housekeeping or managing your Housekeeping? N    Patient Care Team: Dorothyann Peng, NP as PCP - General (Family Medicine) Jerline Pain, MD as PCP - Cardiology (Cardiology) Elsie Stain, MD (Pulmonary Disease) Elsie Stain, MD (Pulmonary  Disease) Willey Blade, MD as Attending Physician (Internal Medicine) Willey Blade, MD as Attending Physician (Internal Medicine) Glendale Chard, MD (Internal Medicine) Ardis Hughs, MD as Attending Physician (Urology)  Indicate any recent Medical Services you may have received from other than [facility] providers in the past year (date may be approximate).     Assessment:   This is a routine wellness examination for [place].  Hearing/Vision screen Hearing Screening - Comments:: Denies hearing difficulties   Vision Screening - Comments:: Wears rx glasses - up to date with routine eye exams with  Dr Katy Fitch  Dietary issues and exercise activities discussed: Current Exercise Habits: The patient does not participate in regular exercise at present, Exercise limited by: orthopedic condition(s)   Goals Addressed               This Visit's Progress     No current goals (pt-stated)         Depression Screen    07/05/2022   10:35 AM 06/26/2021    8:22 AM 06/20/2020    1:36 PM 03/25/2018    3:34 PM 03/15/2016    2:08 PM 03/02/2016   11:13 AM 06/09/2015   11:11 AM  PHQ 2/9 Scores  PHQ - 2 Score 0 0 0 0 0 1 0  PHQ- 9 Score   0        Fall Risk    07/05/2022   10:38 AM 06/26/2021    8:21 AM 06/20/2020    1:34 PM 03/25/2018    3:34 PM 10/22/2017   10:11 AM  [place] in the past year? 0 0 0 No No  Number falls in past yr: 0  0    Injury with Fall? 0  0    Risk for fall due to : No Fall Risks Impaired balance/gait;Impaired mobility;Medication side effect Impaired balance/gait    Follow up Falls prevention discussed Falls evaluation completed;Education provided;Falls prevention discussed Falls evaluation completed;Falls prevention discussed      FALL RISK PREVENTION PERTAINING TO THE HOME:  Any stairs in or around the home? No  If so, are there any without handrails? No  Home free of loose throw rugs in walkways, pet beds, electrical cords,  etc? Yes  Adequate lighting in your home to reduce risk of falls? Yes   ASSISTIVE DEVICES UTILIZED TO PREVENT FALLS:  Life alert? No  Use of a cane, walker or w/c? Yes  Grab bars in the bathroom? Yes  Shower chair or bench in shower? Yes  Elevated toilet seat or a handicapped toilet? Yes   TIMED UP AND GO:  Was the test performed? Yes .  Length of time to ambulate 10 feet: 10 sec.   Gait slow and steady with assistive device  Cognitive Function:        07/05/2022   10:38 AM 06/26/2021    8:24 AM  6CIT Screen  What Year? 0 points 0 points  What month? 0 points 0 points  What time? 0 points 0 points  Count back from 20 0 points 0 points  Months in reverse 2 points 2 points  Repeat phrase 0 points 2 points  Total Score 2 points 4 points    Immunizations Immunization History  Administered Date(s) Administered   Fluad Quad(high Dose 65+) 07/09/2019, 06/20/2020, 05/23/2021   Influenza Split 03/30/2012, 06/15/2015   Influenza Whole 07/02/2011   Influenza, High Dose Seasonal PF 04/29/2016, 09/06/2017, 08/05/2018   Influenza-Unspecified 03/30/2014, 06/30/2015   MODERNA COVID-19 SARS-COV-2  PEDS BIVALENT BOOSTER 6Y-11Y 10/07/2019, 11/04/2019   PPD Test 10/26/2015, 01/03/2016   Pneumococcal Conjugate-13 03/25/2018   Pneumococcal Polysaccharide-23 02/26/2020   Td 12/27/2015   Tdap 12/22/2012    TDAP status: Up to date  Flu Vaccine status: Up to date  Pneumococcal vaccine status: Up to date  Covid-19 vaccine status: Completed vaccines  Qualifies for Shingles Vaccine? Yes   Zostavax completed No   Shingrix Completed?: No.    Education has been provided regarding the importance of this vaccine. Patient has been advised to call insurance company to determine out of pocket expense if they have not yet received this vaccine. Advised may also receive vaccine at local pharmacy or Health Dept. Verbalized acceptance and understanding.  Screening Tests Health Maintenance   Topic Date Due   COVID-19 Vaccine (3 - Moderna risk series) 07/21/2022 (Originally 12/02/2019)   Zoster Vaccines- Shingrix (1 of 2) 10/04/2022 (Originally 02/04/1965)   INFLUENZA VACCINE  10/28/2022 (Originally 02/27/2022)   Medicare Annual Wellness (AWV)  07/06/2023   DTaP/Tdap/Td (3 - Td or Tdap) 12/26/2025   Pneumonia Vaccine 65+ Years old  Completed   Hepatitis C Screening  Completed   HPV VACCINES  Aged Out   COLONOSCOPY (Pts 45-67yr Insurance coverage will need to be confirmed)  Discontinued    Health Maintenance  There are no preventive care reminders to display for this patient.   Colorectal cancer screening: No longer required.   Lung Cancer Screening: (Low Dose CT Chest recommended if Age 76-80years, 30 pack-year currently smoking OR have quit w/in 15years.) does not qualify.     Additional Screening:  Hepatitis C Screening: does qualify; Completed 03/15/17  Vision Screening: Recommended annual ophthalmology exams for early detection of glaucoma and other disorders of the eye. Is the patient up to date with their annual eye exam?  Yes  Who is the provider or what is the name of the office in which the patient attends annual eye exams? Dr GKaty FitchIf pt is not established with a provider, would they like to be referred to a provider to establish care? Yes .   Dental Screening: Recommended annual dental exams for proper oral hygiene  Community Resource Referral / Chronic Care Management:  CRR required this visit?  No   CCM required this visit?  No      Plan:     I have personally reviewed and noted the following in the patient's chart:   Medical and social history Use of alcohol, tobacco or illicit drugs  Current medications and supplements including opioid prescriptions. Patient is currently taking opioid prescriptions. Information provided to patient regarding non-opioid alternatives. Patient advised to discuss non-opioid treatment plan with their  provider. Functional ability and status Nutritional status Physical activity Advanced directives List of other physicians Hospitalizations, surgeries, and ER visits in previous 12 months Vitals Screenings to include cognitive, depression, and falls Referrals and appointments  In addition, I have reviewed and discussed with patient certain preventive protocols, quality metrics, and best practice recommendations. A written personalized care plan for preventive services as well as general preventive health recommendations were provided to patient.     BCriselda Peaches LPN   117/11/1023  Nurse Notes: None

## 2022-07-05 NOTE — Patient Instructions (Addendum)
[name] , Thank you for taking time to come for your Medicare Wellness Visit. I appreciate your ongoing commitment to your health goals. Please review the following plan we discussed and let me know if I can assist you in the future.   These are the goals we discussed:  Goals       No current goals (pt-stated)      Patient Stated      I will continue to walk at least 0.25 miles per day       Patient Stated      06/26/2021, no goals        This is a list of the screening recommended for you and due dates:  Health Maintenance  Topic Date Due   COVID-19 Vaccine (3 - Moderna risk series) 07/21/2022*   Zoster (Shingles) Vaccine (1 of 2) 10/04/2022*   Flu Shot  10/28/2022*   Medicare Annual Wellness Visit  07/06/2023   DTaP/Tdap/Td vaccine (3 - Td or Tdap) 12/26/2025   Pneumonia Vaccine  Completed   Hepatitis C Screening: USPSTF Recommendation to screen - Ages 62-79 yo.  Completed   HPV Vaccine  Aged Out   Colon Cancer Screening  Discontinued  *Topic was postponed. The date shown is not the original due date.  Opioid Pain Medicine Management Opioids are powerful medicines that are used to treat moderate to severe pain. When used for short periods of time, they can help you to: Sleep better. Do better in physical or occupational therapy. Feel better in the first few days after an injury. Recover from surgery. Opioids should be taken with the supervision of a trained health care provider. They should be taken for the shortest period of time possible. This is because opioids can be addictive, and the longer you take opioids, the greater your risk of addiction. This addiction can also be called opioid use disorder. What are the risks? Using opioid pain medicines for longer than 3 days increases your risk of side effects. Side effects include: Constipation. Nausea and vomiting. Breathing difficulties (respiratory depression). Drowsiness. Confusion. Opioid use  disorder. Itching. Taking opioid pain medicine for a long period of time can affect your ability to do daily tasks. It also puts you at risk for: Motor vehicle crashes. Depression. Suicide. Heart attack. Overdose, which can be life-threatening. What is a pain treatment plan? A pain treatment plan is an agreement between you and your health care provider. Pain is unique to each person, and treatments vary depending on your condition. To manage your pain, you and your health care provider need to work together. To help you do this: Discuss the goals of your treatment, including how much pain you might expect to have and how you will manage the pain. Review the risks and benefits of taking opioid medicines. Remember that a good treatment plan uses more than one approach and minimizes the chance of side effects. Be honest about the amount of medicines you take and about any drug or alcohol use. Get pain medicine prescriptions from only one health care provider. Pain can be managed with many types of alternative treatments. Ask your health care provider to refer you to one or more specialists who can help you manage pain through: Physical or occupational therapy. Counseling (cognitive behavioral therapy). Good nutrition. Biofeedback. Massage. Meditation. Non-opioid medicine. Following a gentle exercise program. How to use opioid pain medicine Taking medicine Take your pain medicine exactly as told by your health care provider. Take it only when you  need it. If your pain gets less severe, you may take less than your prescribed dose if your health care provider approves. If you are not having pain, do nottake pain medicine unless your health care provider tells you to take it. If your pain is severe, do nottry to treat it yourself by taking more pills than instructed on your prescription. Contact your health care provider for help. Write down the times when you take your pain medicine. It is  easy to become confused while on pain medicine. Writing the time can help you avoid overdose. Take other over-the-counter or prescription medicines only as told by your health care provider. Keeping yourself and others safe  While you are taking opioid pain medicine: Do not drive, use machinery, or power tools. Do not sign legal documents. Do not drink alcohol. Do not take sleeping pills. Do not supervise children by yourself. Do not do activities that require climbing or being in high places. Do not go to a lake, river, ocean, spa, or swimming pool. Do not share your pain medicine with anyone. Keep pain medicine in a locked cabinet or in a secure area where pets and children cannot reach it. Stopping your use of opioids If you have been taking opioid medicine for more than a few weeks, you may need to slowly decrease (taper) how much you take until you stop completely. Tapering your use of opioids can decrease your risk of symptoms of withdrawal, such as: Pain and cramping in the abdomen. Nausea. Sweating. Sleepiness. Restlessness. Uncontrollable shaking (tremors). Cravings for the medicine. Do not attempt to taper your use of opioids on your own. Talk with your health care provider about how to do this. Your health care provider may prescribe a step-down schedule based on how much medicine you are taking and how long you have been taking it. Getting rid of leftover pills Do not save any leftover pills. Get rid of leftover pills safely by: Taking the medicine to a prescription take-back program. This is usually offered by the county or law enforcement. Bringing them to a pharmacy that has a drug disposal container. Flushing them down the toilet. Check the label or package insert of your medicine to see whether this is safe to do. Throwing them out in the trash. Check the label or package insert of your medicine to see whether this is safe to do. If it is safe to throw it out, remove  the medicine from the original container, put it into a sealable bag or container, and mix it with used coffee grounds, food scraps, dirt, or cat litter before putting it in the trash. Follow these instructions at home: Activity Do exercises as told by your health care provider. Avoid activities that make your pain worse. Return to your normal activities as told by your health care provider. Ask your health care provider what activities are safe for you. General instructions You may need to take these actions to prevent or treat constipation: Drink enough fluid to keep your urine pale yellow. Take over-the-counter or prescription medicines. Eat foods that are high in fiber, such as beans, whole grains, and fresh fruits and vegetables. Limit foods that are high in fat and processed sugars, such as fried or sweet foods. Keep all follow-up visits. This is important. Where to find support If you have been taking opioids for a long time, you may benefit from receiving support for quitting from a local support group or counselor. Ask your health care provider for  a referral to these resources in your area. Where to find more information Centers for Disease Control and Prevention (CDC): http://www.wolf.info/ [place] Food and Drug Administration (FDA): [place] Get help right away if: You may have taken too much of an opioid (overdosed). Common symptoms of an overdose: Your breathing is slower or more shallow than normal. You have a very slow heartbeat (pulse). You have slurred speech. You have nausea and vomiting. Your pupils become very small. You have other potential symptoms: You are very confused. You faint or feel like you will faint. You have cold, clammy skin. You have blue lips or fingernails. You have thoughts of harming yourself or harming others. These symptoms may represent a serious problem that is an emergency. Do not wait to see if the symptoms will go away. Get medical help right away.  Call your local emergency services (911 in the [place] Do not drive yourself to the hospital.  If you ever feel like you may hurt yourself or others, or have thoughts about taking your own life, get help right away. Go to your nearest emergency department or: Call your local emergency services (911 in the [place] Call the [facility] (336)202-1320 in the [place] Call a suicide crisis helpline, such as the [facility] at 682-342-7272 or 988 in the [place]. This is open 24 hours a day in the [place] Text the Crisis Text Line at (919) 315-7825 (in the [place].). Summary Opioid medicines can help you manage moderate to severe pain for a short period of time. A pain treatment plan is an agreement between you and your health care provider. Discuss the goals of your treatment, including how much pain you might expect to have and how you will manage the pain. If you think that you or someone else may have taken too much of an opioid, get medical help right away. This information is not intended to replace advice given to you by your health care provider. Make sure you discuss any questions you have with your health care provider. Document Revised: 02/08/2021 Document Reviewed: 10/26/2020 Elsevier Patient Education  [place] directives:  In chart  Conditions/risks identified: None  Next appointment: Follow up in one year for your annual wellness visit.    Preventive Care 60 Years and Older, Male  Preventive care refers to lifestyle choices and visits with your health care provider that can promote health and wellness. What does preventive care include? A yearly physical exam. This is also called an annual well check. Dental exams once or twice a year. Routine eye exams. Ask your health care provider how often you should have your eyes checked. Personal lifestyle choices, including: Daily care of your teeth and gums. Regular physical  activity. Eating a healthy diet. Avoiding tobacco and drug use. Limiting alcohol use. Practicing safe sex. Taking low doses of aspirin every day. Taking vitamin and mineral supplements as recommended by your health care provider. What happens during an annual well check? The services and screenings done by your health care provider during your annual well check will depend on your age, overall health, lifestyle risk factors, and family history of disease. Counseling  Your health care provider may ask you questions about your: Alcohol use. Tobacco use. Drug use. Emotional well-being. Home and relationship well-being. Sexual activity. Eating habits. History of falls. Memory and ability to understand (cognition). Work and work Statistician. Screening  You may have the following tests or measurements: Height, weight, and BMI. Blood  pressure. Lipid and cholesterol levels. These may be checked every 5 years, or more frequently if you are over 76 years old. Skin check. Lung cancer screening. You may have this screening every year starting at age 31 if you have a 30-pack-year history of smoking and currently smoke or have quit within the past 15 years. Fecal occult blood test (FOBT) of the stool. You may have this test every year starting at age 76. Flexible sigmoidoscopy or colonoscopy. You may have a sigmoidoscopy every 5 years or a colonoscopy every 10 years starting at age 49. Prostate cancer screening. Recommendations will vary depending on your family history and other risks. Hepatitis C blood test. Hepatitis B blood test. Sexually transmitted disease (STD) testing. Diabetes screening. This is done by checking your blood sugar (glucose) after you have not eaten for a while (fasting). You may have this done every 1-3 years. Abdominal aortic aneurysm (AAA) screening. You may need this if you are a current or former smoker. Osteoporosis. You may be screened starting at age 57 if you are  at high risk. Talk with your health care provider about your test results, treatment options, and if necessary, the need for more tests. Vaccines  Your health care provider may recommend certain vaccines, such as: Influenza vaccine. This is recommended every year. Tetanus, diphtheria, and acellular pertussis (Tdap, Td) vaccine. You may need a Td booster every 10 years. Zoster vaccine. You may need this after age 31. Pneumococcal 13-valent conjugate (PCV13) vaccine. One dose is recommended after age 60. Pneumococcal polysaccharide (PPSV23) vaccine. One dose is recommended after age 40. Talk to your health care provider about which screenings and vaccines you need and how often you need them. This information is not intended to replace advice given to you by your health care provider. Make sure you discuss any questions you have with your health care provider. Document Released: 08/12/2015 Document Revised: 04/04/2016 Document Reviewed: 05/17/2015 Elsevier Interactive Patient Education  2017 [place] Prevention in the Home Falls can cause injuries. They can happen to people of all ages. There are many things you can do to make your home safe and to help prevent falls. What can I do on the outside of my home? Regularly fix the edges of walkways and driveways and fix any cracks. Remove anything that might make you trip as you walk through a door, such as a raised step or threshold. Trim any bushes or trees on the path to your home. Use bright outdoor lighting. Clear any walking paths of anything that might make someone trip, such as rocks or tools. Regularly check to see if handrails are loose or broken. Make sure that both sides of any steps have handrails. Any raised decks and porches should have guardrails on the edges. Have any leaves, snow, or ice cleared regularly. Use sand or salt on walking paths during winter. Clean up any spills in your garage right away. This includes oil  or grease spills. What can I do in the bathroom? Use night lights. Install grab bars by the toilet and in the tub and shower. Do not use towel bars as grab bars. Use non-skid mats or decals in the tub or shower. If you need to sit down in the shower, use a plastic, non-slip stool. Keep the floor dry. Clean up any water that spills on the floor as soon as it happens. Remove soap buildup in the tub or shower regularly. Attach bath mats securely with double-sided non-slip rug  tape. Do not have throw rugs and other things on the floor that can make you trip. What can I do in the bedroom? Use night lights. Make sure that you have a light by your bed that is easy to reach. Do not use any sheets or blankets that are too big for your bed. They should not hang down onto the floor. Have a firm chair that has side arms. You can use this for support while you get dressed. Do not have throw rugs and other things on the floor that can make you trip. What can I do in the kitchen? Clean up any spills right away. Avoid walking on wet floors. Keep items that you use a lot in easy-to-reach places. If you need to reach something above you, use a strong step stool that has a grab bar. Keep electrical cords out of the way. Do not use floor polish or wax that makes floors slippery. If you must use wax, use non-skid floor wax. Do not have throw rugs and other things on the floor that can make you trip. What can I do with my stairs? Do not leave any items on the stairs. Make sure that there are handrails on both sides of the stairs and use them. Fix handrails that are broken or loose. Make sure that handrails are as long as the stairways. Check any carpeting to make sure that it is firmly attached to the stairs. Fix any carpet that is loose or worn. Avoid having throw rugs at the top or bottom of the stairs. If you do have throw rugs, attach them to the floor with carpet tape. Make sure that you have a light  switch at the top of the stairs and the bottom of the stairs. If you do not have them, ask someone to add them for you. What else can I do to help prevent falls? Wear shoes that: Do not have high heels. Have rubber bottoms. Are comfortable and fit you well. Are closed at the toe. Do not wear sandals. If you use a stepladder: Make sure that it is fully opened. Do not climb a closed stepladder. Make sure that both sides of the stepladder are locked into place. Ask someone to hold it for you, if possible. Clearly mark and make sure that you can see: Any grab bars or handrails. First and last steps. Where the edge of each step is. Use tools that help you move around (mobility aids) if they are needed. These include: Canes. Walkers. Scooters. Crutches. Turn on the lights when you go into a dark area. Replace any light bulbs as soon as they burn out. Set up your furniture so you have a clear path. Avoid moving your furniture around. If any of your floors are uneven, fix them. If there are any pets around you, be aware of where they are. Review your medicines with your doctor. Some medicines can make you feel dizzy. This can increase your chance of falling. Ask your doctor what other things that you can do to help prevent falls. This information is not intended to replace advice given to you by your health care provider. Make sure you discuss any questions you have with your health care provider. Document Released: 05/12/2009 Document Revised: 12/22/2015 Document Reviewed: 08/20/2014 Elsevier Interactive Patient Education  2017 [employer].

## 2022-07-11 ENCOUNTER — Ambulatory Visit: Payer: Medicare HMO | Attending: Cardiology | Admitting: *Deleted

## 2022-07-11 DIAGNOSIS — I4891 Unspecified atrial fibrillation: Secondary | ICD-10-CM | POA: Diagnosis not present

## 2022-07-11 DIAGNOSIS — Z5181 Encounter for therapeutic drug level monitoring: Secondary | ICD-10-CM

## 2022-07-11 LAB — POCT INR: INR: 4.5 — AB (ref 2.0–3.0)

## 2022-07-11 NOTE — Patient Instructions (Signed)
Description   Do not take any warfarin today then continue taking Warfarin 1/2 tablet daily except for 1 tablet on Tuesdays and Saturdays. Recheck INR in 3 weeks. [facility] [phone] or [phone].

## 2022-07-16 ENCOUNTER — Other Ambulatory Visit: Payer: Self-pay | Admitting: Student

## 2022-07-18 DIAGNOSIS — Z961 Presence of intraocular lens: Secondary | ICD-10-CM | POA: Diagnosis not present

## 2022-07-18 DIAGNOSIS — H353131 Nonexudative age-related macular degeneration, bilateral, early dry stage: Secondary | ICD-10-CM | POA: Diagnosis not present

## 2022-07-18 DIAGNOSIS — H3122 Choroidal dystrophy (central areolar) (generalized) (peripapillary): Secondary | ICD-10-CM | POA: Diagnosis not present

## 2022-08-01 ENCOUNTER — Ambulatory Visit: Payer: Medicare HMO | Attending: Cardiology

## 2022-08-01 DIAGNOSIS — Z5181 Encounter for therapeutic drug level monitoring: Secondary | ICD-10-CM | POA: Diagnosis not present

## 2022-08-01 DIAGNOSIS — I4891 Unspecified atrial fibrillation: Secondary | ICD-10-CM

## 2022-08-01 LAB — POCT INR: INR: 3.8 — AB (ref 2.0–3.0)

## 2022-08-01 NOTE — Patient Instructions (Addendum)
Description   Do not take any warfarin today then continue taking Warfarin 1/2 tablet daily except for 1 tablet on Tuesdays and Saturdays.  Stay consistent with greens (2-3 per week)  Recheck INR in 4 weeks.  [facility] [phone].

## 2022-08-14 DIAGNOSIS — C61 Malignant neoplasm of prostate: Secondary | ICD-10-CM | POA: Diagnosis not present

## 2022-08-14 DIAGNOSIS — Z8546 Personal history of malignant neoplasm of prostate: Secondary | ICD-10-CM | POA: Diagnosis not present

## 2022-08-15 ENCOUNTER — Encounter: Payer: Self-pay | Admitting: Adult Health

## 2022-08-15 ENCOUNTER — Ambulatory Visit (INDEPENDENT_AMBULATORY_CARE_PROVIDER_SITE_OTHER): Payer: Medicare HMO | Admitting: Adult Health

## 2022-08-15 VITALS — BP 120/78 | HR 50 | Temp 98.1°F | Ht 63.0 in | Wt 142.0 lb

## 2022-08-15 DIAGNOSIS — Z0000 Encounter for general adult medical examination without abnormal findings: Secondary | ICD-10-CM | POA: Diagnosis not present

## 2022-08-15 DIAGNOSIS — M545 Low back pain, unspecified: Secondary | ICD-10-CM

## 2022-08-15 DIAGNOSIS — C61 Malignant neoplasm of prostate: Secondary | ICD-10-CM | POA: Diagnosis not present

## 2022-08-15 DIAGNOSIS — Z952 Presence of prosthetic heart valve: Secondary | ICD-10-CM

## 2022-08-15 DIAGNOSIS — E782 Mixed hyperlipidemia: Secondary | ICD-10-CM | POA: Diagnosis not present

## 2022-08-15 DIAGNOSIS — R569 Unspecified convulsions: Secondary | ICD-10-CM | POA: Diagnosis not present

## 2022-08-15 DIAGNOSIS — I10 Essential (primary) hypertension: Secondary | ICD-10-CM | POA: Diagnosis not present

## 2022-08-15 DIAGNOSIS — G8929 Other chronic pain: Secondary | ICD-10-CM

## 2022-08-15 DIAGNOSIS — J438 Other emphysema: Secondary | ICD-10-CM

## 2022-08-15 DIAGNOSIS — I4891 Unspecified atrial fibrillation: Secondary | ICD-10-CM | POA: Diagnosis not present

## 2022-08-15 LAB — CBC WITH DIFFERENTIAL/PLATELET
Basophils Absolute: 0 10*3/uL (ref 0.0–0.1)
Basophils Relative: 0.5 % (ref 0.0–3.0)
Eosinophils Absolute: 0.2 10*3/uL (ref 0.0–0.7)
Eosinophils Relative: 2.3 % (ref 0.0–5.0)
HCT: 39.6 % (ref 39.0–52.0)
Hemoglobin: 13.1 g/dL (ref 13.0–17.0)
Lymphocytes Relative: 17.4 % (ref 12.0–46.0)
Lymphs Abs: 1.3 10*3/uL (ref 0.7–4.0)
MCHC: 33.1 g/dL (ref 30.0–36.0)
MCV: 99.3 fl (ref 78.0–100.0)
Monocytes Absolute: 1.1 10*3/uL — ABNORMAL HIGH (ref 0.1–1.0)
Monocytes Relative: 14.1 % — ABNORMAL HIGH (ref 3.0–12.0)
Neutro Abs: 5 10*3/uL (ref 1.4–7.7)
Neutrophils Relative %: 65.7 % (ref 43.0–77.0)
Platelets: 247 10*3/uL (ref 150.0–400.0)
RBC: 3.99 Mil/uL — ABNORMAL LOW (ref 4.22–5.81)
RDW: 13.2 % (ref 11.5–15.5)
WBC: 7.5 10*3/uL (ref 4.0–10.5)

## 2022-08-15 LAB — COMPREHENSIVE METABOLIC PANEL
ALT: 20 U/L (ref 0–53)
AST: 22 U/L (ref 0–37)
Albumin: 4.2 g/dL (ref 3.5–5.2)
Alkaline Phosphatase: 161 U/L — ABNORMAL HIGH (ref 39–117)
BUN: 9 mg/dL (ref 6–23)
CO2: 24 mEq/L (ref 19–32)
Calcium: 8.8 mg/dL (ref 8.4–10.5)
Chloride: 98 mEq/L (ref 96–112)
Creatinine, Ser: 1.06 mg/dL (ref 0.40–1.50)
GFR: 68.16 mL/min (ref >60.00)
Glucose, Bld: 99 mg/dL (ref 70–99)
Potassium: 3.4 mEq/L — ABNORMAL LOW (ref 3.5–5.1)
Sodium: 134 mEq/L — ABNORMAL LOW (ref 135–145)
Total Bilirubin: 0.5 mg/dL (ref 0.2–1.2)
Total Protein: 7 g/dL (ref 6.0–8.3)

## 2022-08-15 LAB — LIPID PANEL
Cholesterol: 152 mg/dL (ref 0–200)
HDL: 65.6 mg/dL (ref >39.00)
LDL Cholesterol: 73 mg/dL (ref 0–99)
NonHDL: 86.45
Total CHOL/HDL Ratio: 2
Triglycerides: 69 mg/dL (ref 0.0–149.0)
VLDL: 13.8 mg/dL (ref 0.0–40.0)

## 2022-08-15 LAB — TSH: TSH: 0.96 u[IU]/mL (ref 0.35–5.50)

## 2022-08-15 MED ORDER — TRAMADOL HCL 50 MG PO TABS: 50.0000 mg | ORAL_TABLET | Freq: Three times a day (TID) | ORAL | 2 refills | Status: DC | PRN

## 2022-08-15 NOTE — Progress Notes (Signed)
Subjective:    Patient ID: TAVEN STRITE, male    DOB: 1946-01-22, 77 y.o.   MRN: [id number]  HPI Patient presents for yearly preventative medicine examination. He is a pleasant 77 year old male who  has a past medical history of Acute and subacute bacterial endocarditis (1999), Atrial fibrillation ([place]) (11/11/2008), CARDIOMYOPATHY (10/20/2008), Cerebrovascular disease, unspecified, Closed right acetabular fracture ([place]), COPD (chronic obstructive pulmonary disease) ([place]) (1999), Epidural hematoma ([place]) (03/01/2011), Esophageal reflux, Generalized osteoarthrosis, unspecified site, History of tobacco abuse, Hypertension, HYPERTENSION, BENIGN (10/20/2008), Hyponatremia, Insomnia, Iron deficiency anemia, unspecified, ISCHEMIC COLITIS, HX OF (10/20/2008), Lung nodules (2008, 2014), Prostate cancer ([place]) (2015), Protruding sternal wires, S/P mitral valve replacement (02/01/1998), S/P mitral valve replacement with bioprosthetic valve (03/24/2013), Seizures ([place]), Severe mitral regurgitation (02/20/2013), and Unsteady gait.  COPD -managed by pulmonary.  Currently uses Spiriva inhaler daily.  He does feel controlled with this inhaler.  Will have episodes of shortness of breath with exertion at baseline  Epilepsy -managed by neurology with Keppra 500 mg twice daily and Dilantin 300 mg daily.  He denies any recent seizure-like activity  A-fib/mitral valve replacement-mitral valve replacement in 1999 with redo valve in 2014.  He is on Coumadin and statin therapy.  Rate controlled with metoprolol 12.5 mg twice daily.  He denies chest pain palpitations  Hyperlipidemia-managed with Zetia 10 mg daily.  He denies myalgia or fatigue Lab Results  Component Value Date   CHOL 144 05/23/2021   HDL 63.10 05/23/2021   LDLCALC 60 05/23/2021   TRIG 101.0 05/23/2021   CHOLHDL 2 05/23/2021   Essential hypertension-managed with metoprolol 12.5 mg twice daily.  He denies blurred vision, headaches, dizziness, or syncopal  episodes  BP Readings from Last 3 Encounters:  08/15/22 120/78  07/05/22 120/60  06/06/22 122/74    Chronic back pain -does well with tramadol 50 mg twice daily and Zanaflex 4 mg every 6 hours as needed.  Prostate cancer-is followed by urology on a yearly basis.  Diagnosed in July 2016.  At this time he was treated with external beam radiation.  He did not receive hormonal therapy with his radiation.  He has not had any recurrence of his prostate cancer.  All immunizations and health maintenance protocols were reviewed with the patient and needed orders were placed.  Appropriate screening laboratory values were ordered for the patient including screening of hyperlipidemia, renal function and hepatic function. If indicated by BPH, a PSA was ordered.  Medication reconciliation,  past medical history, social history, problem list and allergies were reviewed in detail with the patient  Goals were established with regard to weight loss, exercise, and  diet in compliance with medications. He cannot exercise due to chronic back pain  Wt Readings from Last 3 Encounters:  08/15/22 142 lb (64.4 kg)  07/05/22 147 lb 11.2 oz (67 kg)  06/06/22 136 lb 9.6 oz (62 kg)    Review of Systems  Constitutional: Negative.   HENT: Negative.    Eyes: Negative.   Respiratory:  Positive for shortness of breath.   Cardiovascular: Negative.   Gastrointestinal: Negative.   Endocrine: Negative.   Genitourinary: Negative.   Musculoskeletal:  Positive for arthralgias, back pain and gait problem.  Skin: Negative.   Allergic/Immunologic: Negative.   Hematological: Negative.   Psychiatric/Behavioral: Negative.    All other systems reviewed and are negative.  Past Medical History:  Diagnosis Date   Acute and subacute bacterial endocarditis 1999   group G Streptococcus   Atrial fibrillation ([place])  11/11/2008   Qualifier: Diagnosis of  By: Lia Foyer, MD, FACC, [place] 10/20/2008    Qualifier: Diagnosis of  By: Owens Shark, RN, BSN, [place]     Cerebrovascular disease, unspecified    Closed right acetabular fracture ([place])    COPD (chronic obstructive pulmonary disease) ([place]) 1999   Epidural hematoma ([place]) 03/01/2011   Recent fall 2012    Esophageal reflux    Generalized osteoarthrosis, unspecified site    History of tobacco abuse    Hypertension    HYPERTENSION, BENIGN 10/20/2008   Qualifier: Diagnosis of  By: Owens Shark, RN, BSN, [place]     Hyponatremia    Insomnia    Iron deficiency anemia, unspecified    ISCHEMIC COLITIS, HX OF 10/20/2008   Qualifier: Diagnosis of  By: Owens Shark, RN, BSN, [place]     Lung nodules 2008, 2014   LLL nodule removed 2008 with LLL lobectomy,  RLL nodule 55m observation   Prostate cancer ([place] 2015   Protruding sternal wires    S/P mitral valve replacement 02/01/1998   Carpentier-Edwards porcine bioprosthetic tissue valve, size 31mPlaced for acute and subacute bacterial endocarditis (group G Streptococcus) with pre-existing mitral valve prolapse    S/P mitral valve replacement with bioprosthetic valve 03/24/2013   Redo mitral valve replacement using [id number] Magna mitral bovine bioprosthetic tissue valve performed via right mini thoracotomy   [place]  last seizure 1 year ago   Severe mitral regurgitation 02/20/2013   Unsteady gait     Social History   Socioeconomic History   Marital status: Divorced    Spouse name: Not on file   Number of children: 2   Years of education: 9   Highest education level: Not on file  Occupational History   Occupation: disabled  Tobacco Use   Smoking status: Former    Packs/day: 2.00    Years: 45.00    Total pack years: 90.00    Types: Cigarettes    Quit date: 07/30/2006    Years since quitting: 16.0   Smokeless tobacco: Never  Vaping Use   Vaping Use: Never used  Substance and Sexual Activity   Alcohol use: No    Alcohol/week: 0.0 standard drinks of alcohol    Comment: previous alcohol use &  home-made alcohol consumption   Drug use: No   Sexual activity: Not Currently  Other Topics Concern   Not on file  Social History Narrative   Patient is divorced and has 2 children.   Patient is right handed.   Patient has 9 th grade education.    Patient drinks diet sodas.      [place] [facility]:   From [place] originally. Always lived in [place] worked in conNurse, learning disabilityd roofing. Questionable asbestos exposure. Previously traveled to [place] pets currently. Remote pet owl and parakeet exposure. No mold exposure. Enjoys fishing.    Social Determinants of Health   Financial Resource Strain: Low Risk  (07/05/2022)   Overall Financial Resource Strain (CARDIA)    Difficulty of Paying Living Expenses: Not hard at all  Food Insecurity: No Food Insecurity (07/05/2022)   Hunger Vital Sign    Worried About Running Out of Food in the Last Year: Never true    Ran Out of Food in the Last Year: Never true  Transportation Needs: No Transportation Needs (07/05/2022)   PRAPARE - TraHydrologistedical): No    Lack of Transportation (Non-Medical):  No  Physical Activity: Inactive (07/05/2022)   Exercise Vital Sign    Days of Exercise per Week: 0 days    Minutes of Exercise per Session: 0 min  Stress: No Stress Concern Present (07/05/2022)   [place]    Feeling of Stress : Not at all  Social Connections: Socially Isolated (07/05/2022)   Social Connection and Isolation Panel [NHANES]    Frequency of Communication with Friends and Family: More than three times a week    Frequency of Social Gatherings with Friends and Family: More than three times a week    Attends Religious Services: Never    Marine scientist or Organizations: No    Attends Archivist Meetings: Never    Marital Status: Divorced  Human resources officer Violence: Not At Risk (07/05/2022)   Humiliation, Afraid, Rape, and Kick  questionnaire    Fear of Current or Ex-Partner: No    Emotionally Abused: No    Physically Abused: No    Sexually Abused: No    Past Surgical History:  Procedure Laterality Date   CARDIAC VALVE REPLACEMENT     INTRAOPERATIVE TRANSESOPHAGEAL ECHOCARDIOGRAM N/A 03/24/2013   Procedure: INTRAOPERATIVE TRANSESOPHAGEAL ECHOCARDIOGRAM;  Surgeon: Rexene Alberts, MD;  Location: [facility];  Service: Open Heart Surgery;  Laterality: N/A;   MINIMALLY INVASIVE MAZE PROCEDURE N/A 03/24/2013   Procedure: MINIMALLY INVASIVE MAZE PROCEDURE;  Surgeon: Rexene Alberts, MD;  Location: [facility];  Service: Open Heart Surgery;  Laterality: N/A;   MITRAL VALVE REPLACEMENT  02/01/1998   Carpentier-Edwards porcine bioprosthetic tissue valve, size 70m, placed for complicated bacterial endocarditis   MITRAL VALVE REPLACEMENT Right 03/24/2013   Procedure: MINIMALLY INVASIVE REDO MITRAL VALVE (MV) REPLACEMENT;  Surgeon: CRexene Alberts MD;  [facility]  Service: Open Heart Surgery;  Laterality: Right;   PROSTATE BIOPSY     radiation treatment tx for prostate cancer  2015   STERNAL WIRES REMOVAL N/A 07/10/2018   Procedure: STERNAL WIRES REMOVAL;  Surgeon: ORexene Alberts MD;  [facility]  Service: Thoracic;  Laterality: N/A;   TEE WITHOUT CARDIOVERSION N/A 02/12/2013   Procedure: TRANSESOPHAGEAL ECHOCARDIOGRAM (TEE);  Surgeon: PFay Records MD;  [facility];  Service: Cardiovascular;  Laterality: N/A;   VIDEO ASSISTED THORACOSCOPY  04/29/2007   Left VATS w/ mini thoracotomy for Left Lower Lobectomy for benign lung nodules    Family History  Problem Relation Age of Onset   Cancer Father        prostate cancer   Tuberculosis Father    Heart attack Father    Cancer Brother        prostate cancer   Arthritis Mother    Lung disease Neg Hx     Allergies  Allergen Reactions   Ace Inhibitors Swelling   Lisinopril     Seizures    Rosuvastatin Other (See Comments)    Muscle aches    Current  Outpatient Medications on File Prior to Visit  Medication Sig Dispense Refill   cholecalciferol (VITAMIN D3) 25 MCG (1000 UNIT) tablet Take 1,000 Units by mouth daily.     ezetimibe (ZETIA) 10 MG tablet TAKE 1 TABLET EVERY DAY 90 tablet 3   levETIRAcetam (KEPPRA) 500 MG tablet TAKE 1 TABLET 2 TIMES DAILY. MUST KEEP UPCOMING APPT FOR FURTHER REFILLS 180 tablet 3   metoprolol tartrate (LOPRESSOR) 25 MG tablet Take 0.5 tablets (12.5 mg total) by mouth 2 (two) times daily.  90 tablet 0   Multiple Vitamins-Minerals (PRESERVISION AREDS 2+MULTI VIT PO) Take 1 capsule by mouth daily.     phenytoin (DILANTIN) 100 MG ER capsule TAKE 3 CAPSULES BY MOUTH AT BEDTIME. 270 capsule 3   tamsulosin (FLOMAX) 0.4 MG CAPS capsule Take 0.4 mg by mouth.     tiotropium (SPIRIVA HANDIHALER) 18 MCG inhalation capsule INHALE THE CONTENTS OF 1 CAPSULE EVERY DAY VIA HANDIHALER (NEED MD APPOINTMENT FOR REFILLS) 90 capsule 0   tiZANidine (ZANAFLEX) 4 MG tablet TAKE 1 TABLET EVERY 6 HOURS AS NEEDED FOR MUSCLE SPASMS 360 tablet 1   traMADol (ULTRAM) 50 MG tablet TAKE 1 TABLET EVERY 8 HOURS AS NEEDED 90 tablet 2   warfarin (COUMADIN) 6 MG tablet TAKE 1/2 TO 1 TABLET ONE TIME DAILY AS DIRECTED BY COUMADIN CLINIC 90 tablet 1   No current facility-administered medications on file prior to visit.    BP 120/78   Pulse (!) 50   Temp 98.1 F (36.7 C) (Oral)   Ht '5\' 3"'$  (1.6 m)   Wt 142 lb (64.4 kg)   SpO2 97%   BMI 25.15 kg/m       Objective:   Physical Exam Vitals and nursing note reviewed.  Constitutional:      General: He is not in acute distress.    Appearance: Normal appearance. He is well-developed and normal weight.  HENT:     Head: Normocephalic and atraumatic.     Right Ear: Tympanic membrane, ear canal and external ear normal. There is no impacted cerumen.     Left Ear: Tympanic membrane, ear canal and external ear normal. There is no impacted cerumen.     Nose: Nose normal. No congestion or rhinorrhea.      Mouth/Throat:     Mouth: Mucous membranes are moist.     Pharynx: Oropharynx is clear. No oropharyngeal exudate or posterior oropharyngeal erythema.  Eyes:     General:        Right eye: No discharge.        Left eye: No discharge.     Extraocular Movements: Extraocular movements intact.     Conjunctiva/sclera: Conjunctivae normal.     Pupils: Pupils are equal, round, and reactive to light.  Neck:     Vascular: No carotid bruit.     Trachea: No tracheal deviation.  Cardiovascular:     Rate and Rhythm: Normal rate and regular rhythm.     Pulses: Normal pulses.     Heart sounds: Normal heart sounds. No murmur heard.    No friction rub. No gallop.  Pulmonary:     Effort: Pulmonary effort is normal. No respiratory distress.     Breath sounds: Normal breath sounds. No stridor. No wheezing, rhonchi or rales.  Chest:     Chest wall: No tenderness.  Abdominal:     General: Bowel sounds are normal. There is no distension.     Palpations: Abdomen is soft. There is no mass.     Tenderness: There is no abdominal tenderness. There is no right CVA tenderness, left CVA tenderness, guarding or rebound.     Hernia: No hernia is present.  Musculoskeletal:        General: No swelling, tenderness, deformity or signs of injury. Normal range of motion.     Right lower leg: No edema.     Left lower leg: No edema.  Lymphadenopathy:     Cervical: No cervical adenopathy.  Skin:    General: Skin is warm and  dry.     Capillary Refill: Capillary refill takes less than 2 seconds.     Coloration: Skin is not jaundiced or pale.     Findings: No bruising, erythema, lesion or rash.  Neurological:     General: No focal deficit present.     Mental Status: He is alert and oriented to person, place, and time.     Cranial Nerves: No cranial nerve deficit.     Sensory: No sensory deficit.     Motor: No weakness.     Coordination: Coordination normal.     Gait: Gait normal.     Deep Tendon Reflexes: Reflexes  normal.  Psychiatric:        Mood and Affect: Mood normal.        Behavior: Behavior normal.        Thought Content: Thought content normal.        Judgment: Judgment normal.       Assessment & Plan:  1. Routine general medical examination at a health care facility Today patient counseled on age appropriate routine health concerns for screening and prevention, each reviewed and up to date or declined. Immunizations reviewed and up to date or declined. Labs ordered and reviewed. Risk factors for depression reviewed and negative. Hearing function and visual acuity are intact. ADLs screened and addressed as needed. Functional ability and level of safety reviewed and appropriate. Education, counseling and referrals performed based on assessed risks today. Patient provided with a copy of personalized plan for preventive services. - One year follow up   2. Atrial fibrillation, unspecified type [facility]) - Per cardiology  - CBC with Differential/Platelet; Future - Comprehensive metabolic panel; Future - Lipid panel; Future - TSH; Future  3. HYPERTENSION, BENIGN - Controlled. No change in medication  - CBC with Differential/Platelet; Future - Comprehensive metabolic panel; Future - Lipid panel; Future - TSH; Future  4. Seizures (HCC) - Continue Keppra and Dilantin  - CBC with Differential/Platelet; Future - Comprehensive metabolic panel; Future - Lipid panel; Future - TSH; Future  5. Mixed hyperlipidemia - Continue Zetia  - CBC with Differential/Platelet; Future - Comprehensive metabolic panel; Future - Lipid panel; Future - TSH; Future  6. S/P mitral valve replacement - Per cardiology  - CBC with Differential/Platelet; Future - Comprehensive metabolic panel; Future - Lipid panel; Future - TSH; Future  7. Other emphysema ([place]) - Continue with inhaler. Follow up with pulmonary as directed - CBC with Differential/Platelet; Future - Comprehensive metabolic panel; Future - Lipid  panel; Future - TSH; Future  8. Chronic midline low back pain without sciatica  - traMADol (ULTRAM) 50 MG tablet; Take 1 tablet (50 mg total) by mouth every 8 (eight) hours as needed.  Dispense: 90 tablet; Refill: 2  Dorothyann Peng, NP

## 2022-08-15 NOTE — Patient Instructions (Signed)
It was great seeing you today   We will follow up with you regarding your lab work   Please let me know if you need anything   

## 2022-08-15 NOTE — Assessment & Plan Note (Signed)
Per Urology

## 2022-08-15 NOTE — Assessment & Plan Note (Signed)
Controlled. No change in medications

## 2022-08-21 DIAGNOSIS — Z8546 Personal history of malignant neoplasm of prostate: Secondary | ICD-10-CM | POA: Diagnosis not present

## 2022-08-21 DIAGNOSIS — N3943 Post-void dribbling: Secondary | ICD-10-CM | POA: Diagnosis not present

## 2022-08-23 ENCOUNTER — Telehealth: Payer: Self-pay | Admitting: Student

## 2022-08-23 NOTE — Telephone Encounter (Signed)
PT states Pharm can not get [place] by phone (long hold times) and want [place] to contact them to.   Spiriva  Pharm: [facility] in [place] on [address].

## 2022-08-24 MED ORDER — TIOTROPIUM BROMIDE MONOHYDRATE 18 MCG IN CAPS: ORAL_CAPSULE | RESPIRATORY_TRACT | 2 refills | Status: DC

## 2022-08-24 NOTE — Telephone Encounter (Signed)
Called and spoke to [facility] and they were requesting refills for the patient regarding the Spiriva inhaler. Nothing further needed

## 2022-08-29 ENCOUNTER — Ambulatory Visit: Payer: Medicare HMO | Attending: Cardiology | Admitting: *Deleted

## 2022-08-29 DIAGNOSIS — I4891 Unspecified atrial fibrillation: Secondary | ICD-10-CM | POA: Diagnosis not present

## 2022-08-29 DIAGNOSIS — Z5181 Encounter for therapeutic drug level monitoring: Secondary | ICD-10-CM

## 2022-08-29 LAB — POCT INR: INR: 5.4 — AB (ref 2.0–3.0)

## 2022-08-29 NOTE — Patient Instructions (Signed)
Description   Do not take any warfarin today and No warfarin tomorrow then start taking Warfarin 1/2 tablet daily except for 1 tablet on Saturdays.  Stay consistent with greens (2-3 per week)  Recheck INR in 1 week. (Per pt request, aware of risk)  Coumadin Clinic [phone].

## 2022-09-05 ENCOUNTER — Ambulatory Visit: Payer: Medicare HMO | Attending: Cardiovascular Disease | Admitting: *Deleted

## 2022-09-05 DIAGNOSIS — Z5181 Encounter for therapeutic drug level monitoring: Secondary | ICD-10-CM | POA: Diagnosis not present

## 2022-09-05 DIAGNOSIS — I4891 Unspecified atrial fibrillation: Secondary | ICD-10-CM

## 2022-09-05 LAB — POCT INR: INR: 1.5 — AB (ref 2.0–3.0)

## 2022-09-05 NOTE — Patient Instructions (Signed)
Description   Today take 1 tablet of warfarin and take 1 tablet of warfarin tomorrow then continue taking Warfarin 1/2 tablet daily except for 1 tablet on Tuesdays. Stay consistent with greens (2-3 per week) Recheck INR in 1 week. Coumadin Clinic [phone].

## 2022-09-11 ENCOUNTER — Other Ambulatory Visit: Payer: Self-pay | Admitting: Adult Health

## 2022-09-13 ENCOUNTER — Ambulatory Visit: Payer: Medicare HMO | Attending: Cardiology | Admitting: *Deleted

## 2022-09-13 DIAGNOSIS — Z5181 Encounter for therapeutic drug level monitoring: Secondary | ICD-10-CM

## 2022-09-13 DIAGNOSIS — I4891 Unspecified atrial fibrillation: Secondary | ICD-10-CM | POA: Diagnosis not present

## 2022-09-13 LAB — POCT INR: INR: 1.2 — AB (ref 2.0–3.0)

## 2022-09-13 NOTE — Patient Instructions (Signed)
Description   Today take 1 tablet of warfarin and take 1 tablet of warfarin tomorrow then START taking Warfarin 1/2 tablet daily except for 1 tablet on Tuesdays and Saturdays. Stay consistent with greens (2-3 per week) Recheck INR in 1 week. Coumadin Clinic [phone].

## 2022-09-20 ENCOUNTER — Ambulatory Visit: Payer: Medicare HMO | Attending: Cardiology

## 2022-09-20 DIAGNOSIS — I4891 Unspecified atrial fibrillation: Secondary | ICD-10-CM | POA: Diagnosis not present

## 2022-09-20 DIAGNOSIS — Z5181 Encounter for therapeutic drug level monitoring: Secondary | ICD-10-CM | POA: Diagnosis not present

## 2022-09-20 LAB — POCT INR: INR: 3.6 — AB (ref 2.0–3.0)

## 2022-09-20 NOTE — Patient Instructions (Signed)
Description   Eat a serving of greens today and continue taking Warfarin 1/2 tablet daily except for 1 tablet on Tuesdays and Saturdays.  Stay consistent with greens (2-3 per week)  Recheck INR in 2 weeks. Coumadin Clinic [phone].

## 2022-10-04 ENCOUNTER — Ambulatory Visit: Payer: Medicare HMO | Attending: Cardiology | Admitting: *Deleted

## 2022-10-04 DIAGNOSIS — Z5181 Encounter for therapeutic drug level monitoring: Secondary | ICD-10-CM | POA: Diagnosis not present

## 2022-10-04 DIAGNOSIS — I4891 Unspecified atrial fibrillation: Secondary | ICD-10-CM

## 2022-10-04 LAB — POCT INR: POC INR: 1.9

## 2022-10-04 NOTE — Patient Instructions (Signed)
Description   Take 1 tablet of warfarin today and continue taking Warfarin 1/2 tablet daily except for 1 tablet on Tuesdays and Saturdays.  Stay consistent with greens (2-3 per week)  Recheck INR in 2 weeks. Coumadin Clinic [phone].

## 2022-10-13 ENCOUNTER — Other Ambulatory Visit: Payer: Self-pay | Admitting: Adult Health

## 2022-10-13 DIAGNOSIS — Z760 Encounter for issue of repeat prescription: Secondary | ICD-10-CM

## 2022-10-16 DIAGNOSIS — H353221 Exudative age-related macular degeneration, left eye, with active choroidal neovascularization: Secondary | ICD-10-CM | POA: Diagnosis not present

## 2022-10-16 DIAGNOSIS — H43822 Vitreomacular adhesion, left eye: Secondary | ICD-10-CM | POA: Diagnosis not present

## 2022-10-16 DIAGNOSIS — H43811 Vitreous degeneration, right eye: Secondary | ICD-10-CM | POA: Diagnosis not present

## 2022-10-16 DIAGNOSIS — H35033 Hypertensive retinopathy, bilateral: Secondary | ICD-10-CM | POA: Diagnosis not present

## 2022-10-16 DIAGNOSIS — H35361 Drusen (degenerative) of macula, right eye: Secondary | ICD-10-CM | POA: Diagnosis not present

## 2022-10-18 ENCOUNTER — Ambulatory Visit: Payer: Medicare HMO | Attending: Cardiology | Admitting: *Deleted

## 2022-10-18 DIAGNOSIS — Z5181 Encounter for therapeutic drug level monitoring: Secondary | ICD-10-CM | POA: Diagnosis not present

## 2022-10-18 DIAGNOSIS — I4891 Unspecified atrial fibrillation: Secondary | ICD-10-CM | POA: Diagnosis not present

## 2022-10-18 LAB — POCT INR: INR: 4 — AB (ref 2.0–3.0)

## 2022-10-18 NOTE — Patient Instructions (Signed)
Description   Do not take any warfarin today and then continue taking Warfarin 1/2 tablet daily except for 1 tablet on Tuesdays and Saturdays. Stay consistent with greens (2-3 per week)  Recheck INR in 2 weeks. Coumadin Clinic [phone] or [phone]

## 2022-10-30 DIAGNOSIS — H353221 Exudative age-related macular degeneration, left eye, with active choroidal neovascularization: Secondary | ICD-10-CM | POA: Diagnosis not present

## 2022-11-01 ENCOUNTER — Ambulatory Visit: Payer: Medicare HMO

## 2022-11-06 ENCOUNTER — Ambulatory Visit: Payer: Medicare HMO | Attending: Cardiology

## 2022-11-06 DIAGNOSIS — Z5181 Encounter for therapeutic drug level monitoring: Secondary | ICD-10-CM

## 2022-11-06 DIAGNOSIS — I4891 Unspecified atrial fibrillation: Secondary | ICD-10-CM

## 2022-11-06 LAB — POCT INR: INR: 4.3 — AB (ref 2.0–3.0)

## 2022-11-06 NOTE — Patient Instructions (Signed)
Description   Do not take any warfarin today and then start taking Warfarin 1/2 tablet daily except for 1 tablet on Saturdays. Stay consistent with greens (2-3 per week)  Recheck INR in 2 weeks. Coumadin Clinic [phone] or [phone]

## 2022-11-20 ENCOUNTER — Ambulatory Visit: Payer: Medicare HMO | Attending: Cardiology | Admitting: *Deleted

## 2022-11-20 DIAGNOSIS — Z5181 Encounter for therapeutic drug level monitoring: Secondary | ICD-10-CM

## 2022-11-20 DIAGNOSIS — I4891 Unspecified atrial fibrillation: Secondary | ICD-10-CM | POA: Diagnosis not present

## 2022-11-20 LAB — POCT INR: INR: 4.5 — AB (ref 2.0–3.0)

## 2022-11-20 NOTE — Patient Instructions (Signed)
Description   Do not take any warfarin today and then start taking Warfarin 1/2 tablet daily. Stay consistent with greens (2-3 per week)  Recheck INR in 2 weeks. Coumadin Clinic [phone] or [phone]

## 2022-11-21 ENCOUNTER — Other Ambulatory Visit: Payer: Self-pay | Admitting: Cardiology

## 2022-11-21 DIAGNOSIS — I4891 Unspecified atrial fibrillation: Secondary | ICD-10-CM

## 2022-11-27 DIAGNOSIS — H353221 Exudative age-related macular degeneration, left eye, with active choroidal neovascularization: Secondary | ICD-10-CM | POA: Diagnosis not present

## 2022-12-04 ENCOUNTER — Ambulatory Visit: Payer: Medicare HMO | Attending: Cardiology | Admitting: *Deleted

## 2022-12-04 DIAGNOSIS — I4891 Unspecified atrial fibrillation: Secondary | ICD-10-CM

## 2022-12-04 DIAGNOSIS — Z5181 Encounter for therapeutic drug level monitoring: Secondary | ICD-10-CM | POA: Diagnosis not present

## 2022-12-04 LAB — POCT INR: POC INR: 3

## 2022-12-04 NOTE — Patient Instructions (Signed)
Description   Continue taking Warfarin 1/2 tablet daily. Stay consistent with greens (2-3 per week)  Recheck INR in 3 weeks. Coumadin Clinic [phone] or [phone]

## 2022-12-25 ENCOUNTER — Ambulatory Visit: Payer: Medicare HMO | Attending: Cardiology

## 2022-12-25 ENCOUNTER — Telehealth: Payer: Self-pay | Admitting: Cardiovascular Disease

## 2022-12-25 DIAGNOSIS — I4891 Unspecified atrial fibrillation: Secondary | ICD-10-CM

## 2022-12-25 DIAGNOSIS — Z5181 Encounter for therapeutic drug level monitoring: Secondary | ICD-10-CM | POA: Diagnosis not present

## 2022-12-25 LAB — POCT INR: INR: 3.1 — AB (ref 2.0–3.0)

## 2022-12-25 NOTE — Patient Instructions (Signed)
Continue taking Warfarin 1/2 tablet daily. Stay consistent with greens (2-3 per week)  Recheck INR in 5 weeks. Coumadin Clinic [phone] or [phone]

## 2022-12-27 ENCOUNTER — Other Ambulatory Visit: Payer: Self-pay | Admitting: Adult Health

## 2022-12-27 DIAGNOSIS — M545 Low back pain, unspecified: Secondary | ICD-10-CM

## 2022-12-28 DIAGNOSIS — H353221 Exudative age-related macular degeneration, left eye, with active choroidal neovascularization: Secondary | ICD-10-CM | POA: Diagnosis not present

## 2022-12-28 NOTE — Telephone Encounter (Signed)
Pt LOV 08/15/2022 Last refill was done on 08/15/22 Please advise

## 2022-12-31 ENCOUNTER — Emergency Department (HOSPITAL_COMMUNITY): Payer: Medicare HMO

## 2022-12-31 ENCOUNTER — Other Ambulatory Visit: Payer: Self-pay

## 2022-12-31 ENCOUNTER — Emergency Department (HOSPITAL_COMMUNITY)
Admission: EM | Admit: 2022-12-31 | Discharge: 2022-12-31 | Disposition: A | Discharge status: Home / Self Care | Payer: Medicare HMO | Attending: Emergency Medicine | Admitting: Emergency Medicine

## 2022-12-31 ENCOUNTER — Encounter (HOSPITAL_COMMUNITY): Payer: Self-pay

## 2022-12-31 DIAGNOSIS — R109 Unspecified abdominal pain: Secondary | ICD-10-CM | POA: Diagnosis not present

## 2022-12-31 DIAGNOSIS — Z8546 Personal history of malignant neoplasm of prostate: Secondary | ICD-10-CM | POA: Diagnosis not present

## 2022-12-31 DIAGNOSIS — R079 Chest pain, unspecified: Secondary | ICD-10-CM | POA: Diagnosis not present

## 2022-12-31 DIAGNOSIS — E871 Hypo-osmolality and hyponatremia: Secondary | ICD-10-CM | POA: Diagnosis not present

## 2022-12-31 DIAGNOSIS — J90 Pleural effusion, not elsewhere classified: Secondary | ICD-10-CM | POA: Diagnosis not present

## 2022-12-31 DIAGNOSIS — J449 Chronic obstructive pulmonary disease, unspecified: Secondary | ICD-10-CM

## 2022-12-31 DIAGNOSIS — K439 Ventral hernia without obstruction or gangrene: Secondary | ICD-10-CM

## 2022-12-31 DIAGNOSIS — I10 Essential (primary) hypertension: Secondary | ICD-10-CM | POA: Insufficient documentation

## 2022-12-31 DIAGNOSIS — Z79899 Other long term (current) drug therapy: Secondary | ICD-10-CM | POA: Diagnosis not present

## 2022-12-31 DIAGNOSIS — R059 Cough, unspecified: Secondary | ICD-10-CM | POA: Diagnosis not present

## 2022-12-31 DIAGNOSIS — I700 Atherosclerosis of aorta: Secondary | ICD-10-CM | POA: Diagnosis not present

## 2022-12-31 DIAGNOSIS — Z7901 Long term (current) use of anticoagulants: Secondary | ICD-10-CM | POA: Diagnosis not present

## 2022-12-31 LAB — COMPREHENSIVE METABOLIC PANEL
ALT: 15 U/L (ref 0–44)
AST: 21 U/L (ref 15–41)
Albumin: 4.1 g/dL (ref 3.5–5.0)
Alkaline Phosphatase: 148 U/L — ABNORMAL HIGH (ref 38–126)
Anion gap: 9 (ref 5–15)
BUN: 11 mg/dL (ref 8–23)
CO2: 26 mmol/L (ref 22–32)
Calcium: 9.1 mg/dL (ref 8.9–10.3)
Chloride: 95 mmol/L — ABNORMAL LOW (ref 98–111)
Creatinine, Ser: 1.1 mg/dL (ref 0.61–1.24)
GFR, Estimated: >60 mL/min (ref >60)
Glucose, Bld: 142 mg/dL — ABNORMAL HIGH (ref 70–99)
Potassium: 3.9 mmol/L (ref 3.5–5.1)
Sodium: 130 mmol/L — ABNORMAL LOW (ref 135–145)
Total Bilirubin: 0.6 mg/dL (ref 0.3–1.2)
Total Protein: 6.8 g/dL (ref 6.5–8.1)

## 2022-12-31 LAB — CBC WITH DIFFERENTIAL/PLATELET
Abs Immature Granulocytes: 0.02 10*3/uL (ref 0.00–0.07)
Basophils Absolute: 0.1 10*3/uL (ref 0.0–0.1)
Basophils Relative: 1 %
Eosinophils Absolute: 0 10*3/uL (ref 0.0–0.5)
Eosinophils Relative: 0 %
HCT: 36.7 % — ABNORMAL LOW (ref 39.0–52.0)
Hemoglobin: 12.1 g/dL — ABNORMAL LOW (ref 13.0–17.0)
Immature Granulocytes: 0 %
Lymphocytes Relative: 6 %
Lymphs Abs: 0.6 10*3/uL — ABNORMAL LOW (ref 0.7–4.0)
MCH: 32.3 pg (ref 26.0–34.0)
MCHC: 33 g/dL (ref 30.0–36.0)
MCV: 97.9 fL (ref 80.0–100.0)
Monocytes Absolute: 1 10*3/uL (ref 0.1–1.0)
Monocytes Relative: 11 %
Neutro Abs: 7.4 10*3/uL (ref 1.7–7.7)
Neutrophils Relative %: 82 %
Platelets: 241 10*3/uL (ref 150–400)
RBC: 3.75 MIL/uL — ABNORMAL LOW (ref 4.22–5.81)
RDW: 13.4 % (ref 11.5–15.5)
WBC: 9.1 10*3/uL (ref 4.0–10.5)
nRBC: 0 % (ref 0.0–0.2)

## 2022-12-31 LAB — LIPASE, BLOOD: Lipase: 38 U/L (ref 11–51)

## 2022-12-31 LAB — PROTIME-INR
INR: 2.5 — ABNORMAL HIGH (ref 0.8–1.2)
Prothrombin Time: 27.1 seconds — ABNORMAL HIGH (ref 11.4–15.2)

## 2022-12-31 LAB — PHENYTOIN LEVEL, TOTAL: Phenytoin Lvl: 3.1 ug/mL — ABNORMAL LOW (ref 10.0–20.0)

## 2022-12-31 MED ORDER — FENTANYL CITRATE PF 50 MCG/ML IJ SOSY
50.0000 ug | PREFILLED_SYRINGE | Freq: Once | INTRAMUSCULAR | Status: AC
  Administered 2022-12-31: 50 ug via INTRAVENOUS
  Filled 2022-12-31: qty 1

## 2022-12-31 MED ORDER — ALBUTEROL SULFATE HFA 108 (90 BASE) MCG/ACT IN AERS
2.0000 | INHALATION_SPRAY | Freq: Once | RESPIRATORY_TRACT | Status: AC
  Administered 2022-12-31: 2 via RESPIRATORY_TRACT
  Filled 2022-12-31: qty 6.7

## 2022-12-31 MED ORDER — IOHEXOL 300 MG/ML  SOLN
100.0000 mL | Freq: Once | INTRAMUSCULAR | Status: AC | PRN
  Administered 2022-12-31: 100 mL via INTRAVENOUS

## 2022-12-31 MED ORDER — IPRATROPIUM-ALBUTEROL 0.5-2.5 (3) MG/3ML IN SOLN
3.0000 mL | Freq: Once | RESPIRATORY_TRACT | Status: AC
  Administered 2022-12-31: 3 mL via RESPIRATORY_TRACT
  Filled 2022-12-31: qty 3

## 2022-12-31 NOTE — Telephone Encounter (Signed)
Pt called to F/U on this refill request.  Pt reminded NP is OOO on Mondays.

## 2022-12-31 NOTE — ED Provider Notes (Signed)
[facility] AT [facility] Provider Note   CSN: [id number] Arrival date & time: 12/31/22  0043     History  Chief Complaint  Patient presents with   Abdominal Pain    Jeffery Copeland is a 77 y.o. male.  The history is provided by the patient and a relative.  Abdominal Pain Associated symptoms: cough   Patient with extensive history including atrial fibrillation on Coumadin, hypertension, COPD not on oxygen presents with abdominal pain.  Patient reports he has a chronic hernia in his abdomen that typically resolves.  However he has had increasing cough over the past several hours and now the hernia is hurting and is protruding.  No nausea or vomiting.  No change in his bowel movements     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   Recent fall 2012    Esophageal reflux    Generalized osteoarthrosis, unspecified site    History of tobacco abuse    Hypertension    HYPERTENSION, BENIGN 10/20/2008   Qualifier: Diagnosis of  By: Manson Passey, RN, BSN, Lauren     Hyponatremia    Insomnia    Iron deficiency anemia, unspecified    ISCHEMIC COLITIS, HX OF 10/20/2008   Qualifier: Diagnosis of  By: Manson Passey, RN, BSN, Lauren     Lung nodules 2008, 2014   LLL nodule removed 2008 with LLL lobectomy,  RLL nodule 3mm observation   Prostate cancer (HCC) 2015   Protruding sternal wires    S/P mitral valve replacement 02/01/1998   Carpentier-Edwards porcine bioprosthetic tissue valve, size 31mm Placed for acute and subacute bacterial endocarditis  (group G Streptococcus) with pre-existing mitral valve prolapse    S/P mitral valve replacement with bioprosthetic valve 03/24/2013   Redo mitral valve replacement using 29mm Edwards Magna mitral bovine bioprosthetic tissue valve performed via right mini thoracotomy   Seizures (HCC)    last seizure 1 year ago   Severe mitral regurgitation 02/20/2013   Unsteady gait     Home Medications Prior to Admission medications   Medication Sig Start Date End Date Taking? Authorizing Provider  cholecalciferol (VITAMIN D3) 25 MCG (1000 UNIT) tablet Take 1,000 Units by mouth daily.   Yes [provider]  ezetimibe (ZETIA) 10 MG tablet TAKE 1 TABLET EVERY DAY 01/24/22  Yes Nafziger, Kandee Keen, NP  levETIRAcetam (KEPPRA) 500 MG tablet TAKE 1 TABLET 2 TIMES DAILY. MUST KEEP UPCOMING APPT FOR FURTHER REFILLS 04/09/22  Yes McCue, Shanda Bumps, NP  metoprolol tartrate (LOPRESSOR) 25 MG tablet TAKE 0.5 TABLET (12.5 MG TOTAL) BY MOUTH 2 (TWO) TIMES DAILY. 09/11/22  Yes Nafziger, Kandee Keen, NP  Multiple Vitamins-Minerals (PRESERVISION AREDS 2+MULTI VIT PO) Take 1 capsule by mouth daily.   Yes [provider]  phenytoin (DILANTIN) 100 MG ER capsule TAKE 3 CAPSULES BY MOUTH AT BEDTIME. 01/17/22  Yes McCue, Shanda Bumps, NP  tamsulosin (FLOMAX) 0.4 MG CAPS capsule Take 0.4 mg by mouth daily.   Yes [provider]  tiotropium (SPIRIVA HANDIHALER) 18 MCG inhalation capsule INHALE THE CONTENTS OF 1  CAPSULE EVERY DAY VIA HANDIHALER (NEED MD APPOINTMENT FOR REFILLS) 08/24/22  Yes Omar Person, MD  tiZANidine (ZANAFLEX) 4 MG tablet TAKE 1 TABLET EVERY 6 HOURS AS NEEDED FOR MUSCLE SPASMS Patient taking differently: Take 4 mg by mouth every 6 (six) hours as needed for muscle spasms. 10/17/22  Yes Nafziger, Kandee Keen, NP  traMADol (ULTRAM) 50 MG tablet Take 1 tablet (50 mg total) by mouth every 8 (eight) hours as needed. Patient taking differently: Take 50 mg by mouth every 8 (eight) hours as needed for moderate pain or severe  pain. 08/15/22  Yes Nafziger, Kandee Keen, NP  warfarin (COUMADIN) 6 MG tablet TAKE 1/2 TO 1 TABLET ONE TIME DAILY AS DIRECTED BY COUMADIN CLINIC Patient taking differently: Take 3 mg by mouth daily. TAKE 1 TABLET (3 MG) ONE TIME DAILY AS DIRECTED BY COUMADIN CLINIC 11/21/22  Yes Jake Bathe, MD      Allergies    Ace inhibitors, Lisinopril, and Rosuvastatin    Review of Systems   Review of Systems  Respiratory:  Positive for cough.   Gastrointestinal:  Positive for abdominal pain.    Physical Exam Updated Vital Signs BP (!) 157/73   Pulse 97   Temp 97.7 F (36.5 C) (Oral)   Resp 18   Ht 1.626 m (5\' 4" )   Wt 64.4 kg   SpO2 95%   BMI 24.37 kg/m  Physical Exam CONSTITUTIONAL: Elderly, chronically ill-appearing HEAD: Normocephalic/atraumatic ENMT: Mucous membranes moist NECK: supple no meningeal signs CV: No loud murmurs LUNGS: Coughs frequently, wheezing bilaterally ABDOMEN: soft, protruding midline abdominal wall hernia noted with significant tenderness.  No overlying skin changes NEURO: Pt is awake/alert/appropriate, moves all extremitiesx4.  No facial droop.   SKIN: warm, color normal PSYCH: no abnormalities of mood noted, alert and oriented to situation  ED Results / Procedures / Treatments   Labs (all labs ordered are listed, but only abnormal results are displayed) Labs Reviewed  COMPREHENSIVE METABOLIC PANEL - Abnormal; Notable for the following components:      Result Value   Sodium 130 (*)    Chloride 95 (*)    Glucose, Bld 142 (*)    Alkaline Phosphatase 148 (*)    All other components within normal limits  CBC WITH DIFFERENTIAL/PLATELET - Abnormal; Notable for the following components:   RBC 3.75 (*)    Hemoglobin 12.1 (*)    HCT 36.7 (*)    Lymphs Abs 0.6 (*)    All other components within normal limits  PROTIME-INR - Abnormal; Notable for the following components:   Prothrombin Time 27.1 (*)    INR 2.5 (*)    All other components within normal limits   PHENYTOIN LEVEL, TOTAL - Abnormal; Notable for the following components:   Phenytoin Lvl 3.1 (*)    All other components within normal limits  LIPASE, BLOOD    EKG None  Radiology CT ABDOMEN PELVIS W CONTRAST  Result Date: 12/31/2022 CLINICAL DATA:  77 year old male with acute abdominal pain. EXAM: CT ABDOMEN AND PELVIS WITH CONTRAST TECHNIQUE: Multidetector CT imaging of the abdomen and pelvis was performed using the standard protocol following bolus administration of intravenous contrast. RADIATION DOSE REDUCTION: This exam was performed according to the departmental dose-optimization program which includes automated exposure control, adjustment of the mA and/or kV according to patient size and/or use of iterative reconstruction technique. CONTRAST:  OMNIPAQUE IOHEXOL 300 MG/ML  SOLN COMPARISON:  CT Abdomen and Pelvis 10/24/2015. Portable chest x-ray 0214 hours today. FINDINGS: Lower  chest: Chronic mitral replacement. Chronic but progressed hypo ventilation at the left lung base since 2017 with evidence of calcified pleural disease and fibrothorax there now, including a small to moderate size loculated chronic pleural collection in the lateral costophrenic angle. No pericardial effusion. Right lung base remarkable for centrilobular emphysema and scarring. Hepatobiliary: Negative liver and gallbladder. Pancreas: Negative. Spleen: Negative. Adrenals/Urinary Tract: Normal adrenal glands. Nonobstructed kidneys. Chronic exophytic but simple fluid density and benign renal cysts (no follow-up imaging recommended). Symmetric renal enhancement and on the delayed phase images there is symmetric renal contrast excretion to normal appearing ureters. No nephrolithiasis or renal inflammation. Moderately distended but otherwise unremarkable urinary bladder. Stomach/Bowel: No dilated large or small bowel. Decompressed distal colon to the rectum, such that the distal large bowel and the small bowel loops are  difficult to differentiate. There are borderline or small bilateral inguinal hernias containing mesentery and marginally containing bowel loops as seen on coronal image 77, new from 2017. These do not appear incarcerated. Evidence of normal appendix on coronal image 88. Fairly decompressed stomach and duodenum. No free air, free fluid, or focal mesenteric inflammation is identified. Vascular/Lymphatic: Extensive Aortoiliac calcified atherosclerosis. Normal caliber abdominal aorta. Major arterial structures in the abdomen and pelvis remain patent. Grossly patent portal venous system. No lymphadenopathy identified. Reproductive: Chronic prostatectomy or prostate brachytherapy. Borderline or small bilateral inguinal hernias as above. Other: No pelvis free fluid. Musculoskeletal: Diffuse osteopenia. Healing left posterior lower rib fractures, new since 2017 with callus formation. Partially visible sternotomy. No obvious acute lower rib fracture. Widespread spinal compression fractures. Diffuse lower thoracic compression fractures through L2 (moderate) and L3 (mild) are stable from the 2017 CT. Mildly displaced and angulated S2-S3, S4-S5 sacral fractures appear to be new from that time but could be chronic. Comminuted right acetabular and pubic rami fractures in 2017 have healed with acetabular protrusio on that side. And there is evidence of interval insufficiency fractures at the pubic symphysis. However, no definite acute osseous abnormality identified. IMPRESSION: 1. Small bilateral inguinal hernias marginally contain bowel and are new from 2017. However, there is no evidence of bowel obstruction. And no discrete inflammatory process identified in the abdomen or pelvis. 2. Diffuse osteopenia with widespread spinal, sacral, pelvic, and lower left rib fractures which appear non-acute but progressed since 2017. 3. 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 Underlying Emphysema (ICD10-J43.9). 4.  Aortic Atherosclerosis (ICD10-I70.0). Electronically Signed   By: Odessa Fleming M.D.   On: 12/31/2022 04:28   DG Chest Portable 1 View  Result Date: 12/31/2022 CLINICAL DATA:  Cough, abdominal pain EXAM: PORTABLE CHEST 1 VIEW COMPARISON:  03/25/2018 FINDINGS: Single frontal view of the chest demonstrates stable cardiac silhouette and mitral valve replacement. Stable postsurgical changes from left lower lobectomy. There is increased consolidation at the left lung base. Blunting of the left costophrenic angle consistent with chronic loculated left pleural effusion. Right chest is clear. No pneumothorax. No acute bony abnormalities. IMPRESSION: 1. Increased left basilar consolidation which may reflect atelectasis or acute airspace disease. 2. Chronic postsurgical changes from left lower lobectomy, with stable loculated left pleural effusion obscuring the left costophrenic angle. Electronically Signed   By: Sharlet Salina M.D.   On: 12/31/2022 02:22    Procedures Procedures    Medications Ordered in ED Medications  ipratropium-albuterol (DUONEB) 0.5-2.5 (3) MG/3ML nebulizer solution 3 mL (3 mLs Nebulization Given 12/31/22 0156)  fentaNYL (SUBLIMAZE) injection 50 mcg (50 mcg  Intravenous Given 12/31/22 0157)  fentaNYL (SUBLIMAZE) injection 50 mcg (50 mcg Intravenous Given 12/31/22 0413)  iohexol (OMNIPAQUE) 300 MG/ML solution 100 mL (100 mLs Intravenous Contrast Given 12/31/22 0400)  albuterol (VENTOLIN HFA) 108 (90 Base) MCG/ACT inhaler 2 puff (2 puffs Inhalation Given 12/31/22 0504)    ED Course/ Medical Decision Making/ A&P Clinical Course as of 12/31/22 0505  Mon Dec 31, 2022  0147 Patient presents with increasing abdominal pain around the hernia that is worsened after coughing.  Will initially focus on treating his cough and obtain a chest x-ray.  He would likely require CT imaging of his abd/pelvis [DW]  0251 Sodium(!): 130 Mild  hyponatremia [DW]  0346 Glucose(!): 142 Mild hyperglycemia [DW]  0504 No signs of incarcerated hernia on CT imaging.  His abdominal wall hernia appears to be improved at this time With nurse present, I did evaluate for inguinal hernias and there is no signs of any significant hernia clinically. [DW]  5784 Overall patient is stable for discharge.  However he does have progression of his lung disease, COPD and fibrosis.  He has not seen pulmonology in at least a year.  Discussed at length with patient and his sister who helps care for patient.  Advised the need to follow-up with pulmonary soon as possible, he will also be given albuterol for his cough.  Patient is not hypoxic or in distress at this time.  He has had dyspnea on exertion, however this is not new. [DW]    Clinical Course User Index [DW] Zadie Rhine, MD                             Medical Decision Making Amount and/or Complexity of Data Reviewed Labs: ordered. Decision-making details documented in ED Course. Radiology: ordered.  Risk Prescription drug management.   This patient presents to the ED for concern of abdominal pain, this involves an extensive number of treatment options, and is a complaint that carries with it a high risk of complications and morbidity.  The differential diagnosis includes but is not limited to cholecystitis, cholelithiasis, pancreatitis, gastritis, peptic ulcer disease, appendicitis, bowel obstruction, bowel perforation, diverticulitis, AAA, ischemic bowel, incarcerated hernia    Comorbidities that complicate the patient evaluation: Patient's presentation is complicated by their history of COPD and atrial fibrillation  Social Determinants of Health: Patient's  previous history of tobacco use   increases the complexity of managing their presentation  Additional history obtained: Additional history obtained from  sister at bedside Records reviewed  outpatient records reviewed  Lab  Tests: I Ordered, and personally interpreted labs.  The pertinent results include: Mild hyponatremia  Imaging Studies ordered: I ordered imaging studies including CT scan abdomen pelvis and X-ray chest   I independently visualized and interpreted imaging which showed no evidence of incarcerated hernia, multiple incidental findings I agree with the radiologist interpretation  Medicines ordered and prescription drug management: I ordered medication including albuterol for cough Fentanyl for pain Reevaluation of the patient after these medicines showed that the patient    improved  Reevaluation: After the interventions noted above, I reevaluated the patient and found that they have :improved  Complexity of problems addressed: Patient's presentation is most consistent with  acute presentation with potential threat to life or bodily function  Disposition: After consideration of the diagnostic results and the patient's response to treatment,  I feel that the patent would benefit from discharge   .  Final Clinical Impression(s) / ED Diagnoses Final diagnoses:  Hernia of abdominal wall  Chronic obstructive pulmonary disease, unspecified COPD type [facility])    Rx / DC Orders ED Discharge Orders     None         Zadie Rhine, MD 12/31/22 [phone]

## 2022-12-31 NOTE — ED Triage Notes (Signed)
Pt arrives c/o abdominal pain above umbilicus. States that he believes he has a hernia and it started hurting more after he coughed tonight. States that the area in his abdomen is sticking out more after he coughed as well. Pt was tachypneic after walking approx 30 feet to triage room - denies SOB. States that he get SOB with exertion due to COPD at baseline.

## 2023-01-04 ENCOUNTER — Telehealth: Payer: Self-pay

## 2023-01-04 NOTE — Telephone Encounter (Signed)
Transition Care Management Unsuccessful Follow-up Telephone Call  Date of discharge and from where:  12/31/2022 [facility]  Attempts:  1st Attempt  Reason for unsuccessful TCM follow-up call:  No answer/busy  Mccade Sullenberger [facility]  [facility] Population Health Community Resource Care Guide   [email or web address]  ?? [phone]   Website: [email or web address]  [email or web address]

## 2023-01-06 ENCOUNTER — Telehealth: Payer: Self-pay | Admitting: Student

## 2023-01-06 NOTE — Telephone Encounter (Signed)
-----   Message from Zadie Rhine, MD sent at 12/31/2022  5:01 AM EDT ----- Regarding: copd patient f/u Can you f/u with this patient?  Looks like he has been lost to f/u, worsening COPD.  Thanks

## 2023-01-06 NOTE — Telephone Encounter (Signed)
Can we try to get him follow up next available with any MD or APP/  Thanks!

## 2023-01-07 ENCOUNTER — Telehealth: Payer: Self-pay

## 2023-01-07 NOTE — Telephone Encounter (Signed)
Transition Care Management Follow-up Telephone Call Date of discharge and from where: 12/31/2022 [facility] How have you been since you were released from the hospital? Patient is feeling much better Any questions or concerns? No  Items Reviewed: Did the pt receive and understand the discharge instructions provided? Yes  Medications obtained and verified? Yes  Other? No  Any new allergies since your discharge? No  Dietary orders reviewed? Yes Do you have support at home? Yes   Follow up appointments reviewed:  PCP Hospital f/u appt confirmed? No  Scheduled to see  on  @ . Specialist Hospital f/u appt confirmed? Yes  Scheduled to see Jeffery Bonier, MD on 01/11/2023 @ [facility]. Are transportation arrangements needed? No  If their condition worsens, is the pt aware to call PCP or go to the Emergency Dept.? Yes Was the patient provided with contact information for the PCP's office or ED? Yes Was to pt encouraged to call back with questions or concerns? Yes  [facility]  [facility] Population Health Community Resource Care Guide   [email or web address]  ?? [phone]   Website: [email or web address]  [email or web address]

## 2023-01-08 NOTE — Progress Notes (Signed)
Synopsis: Referred for COPD by Shirline Frees, NP  Subjective:   PATIENT ID: Jeffery Copeland: male DOB: [birth year], MRN: [id number]  Chief Complaint  Patient presents with   Follow-up    Breathing is unchanged. He states some days he is out of breath walking room to room.    77yM COPD with emphysema, left lower lobectomy for lung nodule 2008, GERD, AF, prostate cancer.  His DOE is stable. He has had no exacerbations requiring hospitalization or steroids. He says he is fully vaccinated for covid-19, pneumonia vaccination up to date. Takes spiriva daily and thinks it's helpful for his dyspnea.  Interval HPI  Somewhat increased size of LLL loculated collection and some surrounding calcification now  Still feels winded walking room to room. No cough.   Otherwise pertinent review of systems is negative.  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   Recent fall 2012    Esophageal reflux    Generalized osteoarthrosis, unspecified site    History of tobacco abuse    Hypertension    HYPERTENSION, BENIGN 10/20/2008   Qualifier: Diagnosis of  By: Manson Passey, RN, BSN, Lauren     Hyponatremia    Insomnia    Iron deficiency anemia, unspecified    ISCHEMIC COLITIS, HX OF 10/20/2008   Qualifier: Diagnosis of  By: Manson Passey, RN, BSN, Lauren     Lung nodules 2008, 2014   LLL nodule removed 2008 with LLL lobectomy,  RLL nodule 3mm observation   Prostate cancer (HCC) 2015   Protruding sternal wires    S/P mitral valve replacement 02/01/1998   Carpentier-Edwards porcine  bioprosthetic tissue valve, size 31mm Placed for acute and subacute bacterial endocarditis (group G Streptococcus) with pre-existing mitral valve prolapse    S/P mitral valve replacement with bioprosthetic valve 03/24/2013   Redo mitral valve replacement using 29mm Edwards Magna mitral bovine bioprosthetic tissue valve performed via right mini thoracotomy   Seizures (HCC)    last seizure 1 year ago   Severe mitral regurgitation 02/20/2013   Unsteady gait      Family History  Problem Relation Age of Onset   Cancer Father        prostate cancer   Tuberculosis Father    Heart attack Father    Cancer Brother        prostate cancer   Arthritis Mother    Lung disease Neg Hx      Past Surgical History:  Procedure Laterality Date   CARDIAC VALVE REPLACEMENT     INTRAOPERATIVE TRANSESOPHAGEAL ECHOCARDIOGRAM N/A 03/24/2013   Procedure: INTRAOPERATIVE TRANSESOPHAGEAL ECHOCARDIOGRAM;  Surgeon: Purcell Nails, MD;  Location: [facility] OR;  Service: Open Heart Surgery;  Laterality: N/A;   MINIMALLY INVASIVE MAZE PROCEDURE N/A 03/24/2013   Procedure: MINIMALLY INVASIVE MAZE PROCEDURE;  Surgeon: Purcell Nails, MD;  Location: [facility] OR;  Service: Open Heart Surgery;  Laterality: N/A;   MITRAL VALVE REPLACEMENT  02/01/1998   Carpentier-Edwards porcine bioprosthetic tissue valve, size 31mm, placed for complicated bacterial endocarditis   MITRAL VALVE REPLACEMENT Right 03/24/2013  Procedure: MINIMALLY INVASIVE REDO MITRAL VALVE (MV) REPLACEMENT;  Surgeon: Purcell Nails, MD;  Location: [facility] OR;  Service: Open Heart Surgery;  Laterality: Right;   PROSTATE BIOPSY     radiation treatment tx for prostate cancer  2015   STERNAL WIRES REMOVAL N/A 07/10/2018   Procedure: STERNAL WIRES REMOVAL;  Surgeon: Purcell Nails, MD;  Location: [facility] OR;  Service: Thoracic;  Laterality: N/A;   TEE WITHOUT CARDIOVERSION N/A 02/12/2013   Procedure: TRANSESOPHAGEAL ECHOCARDIOGRAM (TEE);  Surgeon: Pricilla Riffle, MD;  Location: [facility]  ENDOSCOPY;  Service: Cardiovascular;  Laterality: N/A;   VIDEO ASSISTED THORACOSCOPY  04/29/2007   Left VATS w/ mini thoracotomy for Left Lower Lobectomy for benign lung nodules    Social History   Socioeconomic History   Marital status: Divorced    Spouse name: Not on file   Number of children: 2   Years of education: 9   Highest education level: Not on file  Occupational History   Occupation: disabled  Tobacco Use   Smoking status: Former    Packs/day: 2.00    Years: 45.00    Additional pack years: 0.00    Total pack years: 90.00    Types: Cigarettes    Quit date: 07/30/2006    Years since quitting: 16.4   Smokeless tobacco: Never  Vaping Use   Vaping Use: Never used  Substance and Sexual Activity   Alcohol use: No    Alcohol/week: 0.0 standard drinks of alcohol    Comment: previous alcohol use & home-made alcohol consumption   Drug use: No   Sexual activity: Not Currently  Other Topics Concern   Not on file  Social History Narrative   Patient is divorced and has 2 children.   Patient is right handed.   Patient has 9 th grade education.    Patient drinks diet sodas.      [facility]:   From [place] originally. Always lived in [place]. 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 Questionable asbestos exposure. Previously traveled to [place], [place], & [place]. No pets currently. Remote pet owl and parakeet exposure. No mold exposure. Enjoys fishing.    Social Determinants of Health   Financial Resource Strain: Low Risk  (07/05/2022)   Overall Financial Resource Strain (CARDIA)    Difficulty of Paying Living Expenses: Not hard at all  Food Insecurity: No Food Insecurity (07/05/2022)   Hunger Vital Sign    Worried About Running Out of Food in the Last Year: Never true    Ran Out of Food in the Last Year: Never true  Transportation Needs: No Transportation Needs (07/05/2022)   PRAPARE - Administrator, Civil Service (Medical): No    Lack of Transportation (Non-Medical): No   Physical Activity: Inactive (07/05/2022)   Exercise Vital Sign    Days of Exercise per Week: 0 days    Minutes of Exercise per Session: 0 min  Stress: No Stress Concern Present (07/05/2022)   [employer] of Occupational Health - Occupational Stress Questionnaire    Feeling of Stress : Not at all  Social Connections: Socially Isolated (07/05/2022)   Social Connection and Isolation Panel [NHANES]    Frequency of Communication with Friends and Family: More than three times a week    Frequency of Social Gatherings with Friends and Family: More than three times a week    Attends Religious Services: Never    Database administrator or Organizations: No    Attends Banker  Meetings: Never    Marital Status: Divorced  Catering manager Violence: Not At Risk (07/05/2022)   Humiliation, Afraid, Rape, and Kick questionnaire    Fear of Current or Ex-Partner: No    Emotionally Abused: No    Physically Abused: No    Sexually Abused: No     Allergies  Allergen Reactions   Ace Inhibitors Swelling   Lisinopril     Seizures    Rosuvastatin Other (See Comments)    Muscle aches     Outpatient Medications Prior to Visit  Medication Sig Dispense Refill   cholecalciferol (VITAMIN D3) 25 MCG (1000 UNIT) tablet Take 1,000 Units by mouth daily.     ezetimibe (ZETIA) 10 MG tablet TAKE 1 TABLET EVERY DAY 90 tablet 3   levETIRAcetam (KEPPRA) 500 MG tablet TAKE 1 TABLET 2 TIMES DAILY. MUST KEEP UPCOMING APPT FOR FURTHER REFILLS 180 tablet 3   metoprolol tartrate (LOPRESSOR) 25 MG tablet TAKE 0.5 TABLET (12.5 MG TOTAL) BY MOUTH 2 (TWO) TIMES DAILY. 90 tablet 3   Multiple Vitamins-Minerals (PRESERVISION AREDS 2+MULTI VIT PO) Take 1 capsule by mouth daily.     phenytoin (DILANTIN) 100 MG ER capsule TAKE 3 CAPSULES BY MOUTH AT BEDTIME. 270 capsule 3   tamsulosin (FLOMAX) 0.4 MG CAPS capsule Take 0.4 mg by mouth daily.     tiZANidine (ZANAFLEX) 4 MG tablet TAKE 1 TABLET EVERY 6 HOURS AS NEEDED  FOR MUSCLE SPASMS (Patient taking differently: Take 4 mg by mouth every 6 (six) hours as needed for muscle spasms.) 360 tablet 3   traMADol (ULTRAM) 50 MG tablet TAKE 1 TABLET EVERY 8 HOURS AS NEEDED 90 tablet 2   warfarin (COUMADIN) 6 MG tablet TAKE 1/2 TO 1 TABLET ONE TIME DAILY AS DIRECTED BY COUMADIN CLINIC (Patient taking differently: Take 3 mg by mouth daily. TAKE 1 TABLET (3 MG) ONE TIME DAILY AS DIRECTED BY COUMADIN CLINIC) 90 tablet 1   tiotropium (SPIRIVA HANDIHALER) 18 MCG inhalation capsule INHALE THE CONTENTS OF 1 CAPSULE EVERY DAY VIA HANDIHALER (NEED MD APPOINTMENT FOR REFILLS) 90 capsule 2   No facility-administered medications prior to visit.       Objective:   Physical Exam:  General appearance: chronically ill appearing 77 y.o., male, NAD, conversant  Eyes: anicteric sclerae, moist conjunctivae; no lid-lag; PERRL, tracking appropriately HENT: NCAT; oropharynx, MMM, no mucosal ulcerations; normal hard and soft palate Neck: Trachea midline; no lymphadenopathy, no JVD Lungs: kyphotic, diminished b/l, no crackles, no wheeze, with normal respiratory effort CV: RRR, no MRGs  Abdomen: Soft, non-tender; non-distended, BS present  Extremities: No peripheral edema, radial and DP pulses present bilaterally  Skin: Normal temperature, turgor and texture; no rash Psych: Appropriate affect Neuro: Alert and oriented to person and place, no focal deficit    Vitals:   01/11/23 1058  BP: 130/70  Pulse: 62  Temp: 98 F (36.7 C)  TempSrc: Oral  SpO2: 95%  Weight: 132 lb (59.9 kg)  Height: 5\' 4"  (1.626 m)    95% on RA BMI Readings from Last 3 Encounters:  01/11/23 22.66 kg/m  12/31/22 24.37 kg/m  08/15/22 25.15 kg/m   Wt Readings from Last 3 Encounters:  01/11/23 132 lb (59.9 kg)  12/31/22 142 lb (64.4 kg)  08/15/22 142 lb (64.4 kg)     CBC    Component Value Date/Time   WBC 9.1 12/31/2022 0108   RBC 3.75 (L) 12/31/2022 0108   HGB 12.1 (L) 12/31/2022 0108    HGB 12.4 (L) 02/21/2021  0000   HCT 36.7 (L) 12/31/2022 0108   HCT 35.9 (L) 02/21/2021 0000   PLT 241 12/31/2022 0108   PLT 189 02/21/2021 0000   MCV 97.9 12/31/2022 0108   MCV 96 02/21/2021 0000   MCH 32.3 12/31/2022 0108   MCHC 33.0 12/31/2022 0108   RDW 13.4 12/31/2022 0108   RDW 11.5 (L) 02/21/2021 0000   LYMPHSABS 0.6 (L) 12/31/2022 0108   LYMPHSABS 0.6 (L) 10/22/2017 1050   MONOABS 1.0 12/31/2022 0108   EOSABS 0.0 12/31/2022 0108   EOSABS 0.1 10/22/2017 1050   BASOSABS 0.1 12/31/2022 0108   BASOSABS 0.0 10/22/2017 1050    No remarkable eosinophilia  Chest Imaging:  CT Chest 2019 reviewed by me and remarkable for increased AP diameter, upper lobe predominant emphysema, s/p LL lobectomy, patulous esophagus, huge left atrium  Pulmonary Functions Testing Results:    Latest Ref Rng & Units 11/06/2016   10:25 AM  PFT Results  FVC-Pre L 2.57   FVC-Predicted Pre % 71   FVC-Post L 2.62   FVC-Predicted Post % 72   Pre FEV1/FVC % % 59   Post FEV1/FCV % % 61   FEV1-Pre L 1.52   FEV1-Predicted Pre % 58   FEV1-Post L 1.60   DLCO uncorrected ml/min/mmHg 9.07   DLCO UNC% % 35   DLCO corrected ml/min/mmHg 9.81   DLCO COR %Predicted % 38   DLVA Predicted % 64    > 11/06/2016: FVC 72%, FEV1 61%, ratio 61, no significant bronchodilatory response, DLCO 35%.  > 08/29/2015- No oxygen desaturation  > Chest CT 2017- stable 4mm right middle lobe nodule unchanged from 2014  > 03/03/13: FVC 2.85 L (76%) FEV1 1.78 L (64%) FEV1/FVC 0.63 no bronchodilator response TLC 5.50 L (91%) RV 110% (unable to obtain DLCO)     Path:   2008: 1. LUNG, LEFT LOWER LOBE, RESECTION:   - BENIGN SUBPLEURAL AND INTRAPARENCHYMAL LYMPH NODES.   - BENIGN LUNG PARENCHYMA.   - NO GRANULOMAS OR NEOPLASM IDENTIFIED.   - BRONCHIAL AND VASCULAR MARGINS NEGATIVE FOR TUMOR.    2. LYMPH NODE, 11L, BIOPSY: BENIGN LYMPH NODE.    3. LYMPH NODE, LEVEL 3, BIOPSY: BENIGN LYMPH NODE   Echocardiogram:    02/2018: - Normal LV systolic function; trace AI; mildly dilated aortic    root; s/p MVR with normal mean gradient of 3 mmHg; severe LAE;    mild TR with moderate pulmonary hypertension (RVSP of 48)  TTE 12/023:  1. Left ventricular ejection fraction, by estimation, is 55 to 60%. The  left ventricle has normal function. The left ventricle has no regional  wall motion abnormalities. Left ventricular diastolic function could not  be evaluated.   2. Right ventricular systolic function is mildly reduced. The right  ventricular size is mildly enlarged. There is mildly elevated pulmonary  artery systolic pressure. The estimated right ventricular systolic  pressure is 40.9 mmHg.   3. Left atrial size was moderately dilated.   4. 29 mm Edwards Magna prosthesis in the mitral position (03/24/2013). MG  3.5 mmHG @ 53 bpm. The mitral valve has been repaired/replaced. No  evidence of mitral valve regurgitation. The mean mitral valve gradient is  3.5 mmHg with average heart rate of 53  bpm. There is a 29 mm [employer] present in the mitral position.  Procedure Date: 03/24/2013. Echo findings are consistent with normal  structure and function of the mitral valve prosthesis.   5. The aortic valve is tricuspid. Aortic  valve regurgitation is not  visualized. No aortic stenosis is present.   6. The inferior vena cava is normal in size with greater than 50%  respiratory variability, suggesting right atrial pressure of 3 mmHg.      Assessment & Plan:   # DOE, stable # COPD gold functional group B:  # S/p remote LL lobectomy:  # Remote smoking:  Plan: - will hold off on PFTs, his symptoms and COPD functional status are stable - trial stiolto, reviewed technique in clinic, will price out LABA/LAMA  - has declined pulmonary rehab - he will get a flu shot with his PCP, has had RSV vaccination, pneumococcal vaccination   RTC 3 months to see if new inhaler helping with DOE  Omar Person,  MD [facility] 01/11/2023 11:03 AM

## 2023-01-11 ENCOUNTER — Ambulatory Visit (INDEPENDENT_AMBULATORY_CARE_PROVIDER_SITE_OTHER): Payer: Medicare HMO | Admitting: Student

## 2023-01-11 ENCOUNTER — Encounter: Payer: Self-pay | Admitting: Student

## 2023-01-11 ENCOUNTER — Telehealth: Payer: Self-pay | Admitting: Student

## 2023-01-11 VITALS — BP 130/70 | HR 62 | Temp 98.0°F | Ht 64.0 in | Wt 132.0 lb

## 2023-01-11 DIAGNOSIS — J449 Chronic obstructive pulmonary disease, unspecified: Secondary | ICD-10-CM | POA: Diagnosis not present

## 2023-01-11 DIAGNOSIS — R0609 Other forms of dyspnea: Secondary | ICD-10-CM | POA: Diagnosis not present

## 2023-01-11 MED ORDER — STIOLTO RESPIMAT 2.5-2.5 MCG/ACT IN AERS: 2.0000 | INHALATION_SPRAY | Freq: Every day | RESPIRATORY_TRACT | 0 refills | Status: DC

## 2023-01-11 MED ORDER — ALBUTEROL SULFATE HFA 108 (90 BASE) MCG/ACT IN AERS: 2.0000 | INHALATION_SPRAY | Freq: Four times a day (QID) | RESPIRATORY_TRACT | 6 refills | Status: DC | PRN

## 2023-01-11 NOTE — Telephone Encounter (Signed)
What is most affordable laba/lama inhaler for him?  Thanks!

## 2023-01-11 NOTE — Patient Instructions (Addendum)
-   Try stiolto 2 puffs daily, stop spiriva while you are on stiolto. I will ask our pharmacists what the most affordable inhaler of this type will be for you.  - Albuterol 1-2 puffs as needed this is your rescue inhaler - see you in 3 months or sooner if need be

## 2023-01-14 ENCOUNTER — Other Ambulatory Visit (HOSPITAL_COMMUNITY): Payer: Self-pay

## 2023-01-14 NOTE — Telephone Encounter (Signed)
Per benefits investigation there are no LABA/LAMA options covered at this time.

## 2023-01-15 ENCOUNTER — Other Ambulatory Visit (HOSPITAL_COMMUNITY): Payer: Self-pay

## 2023-01-15 NOTE — Telephone Encounter (Signed)
Neither of those are showing covered either.

## 2023-01-21 MED ORDER — TIOTROPIUM BROMIDE MONOHYDRATE 18 MCG IN CAPS: 18.0000 ug | ORAL_CAPSULE | Freq: Every day | RESPIRATORY_TRACT | 3 refills | Status: DC

## 2023-01-21 NOTE — Addendum Note (Signed)
Addended by: Omar Person on: 01/21/2023 02:11 PM   Modules accepted: Orders

## 2023-01-28 DIAGNOSIS — H43811 Vitreous degeneration, right eye: Secondary | ICD-10-CM | POA: Diagnosis not present

## 2023-01-28 DIAGNOSIS — Z961 Presence of intraocular lens: Secondary | ICD-10-CM | POA: Diagnosis not present

## 2023-01-28 DIAGNOSIS — H35361 Drusen (degenerative) of macula, right eye: Secondary | ICD-10-CM | POA: Diagnosis not present

## 2023-01-28 DIAGNOSIS — H35033 Hypertensive retinopathy, bilateral: Secondary | ICD-10-CM | POA: Diagnosis not present

## 2023-01-28 DIAGNOSIS — H43822 Vitreomacular adhesion, left eye: Secondary | ICD-10-CM | POA: Diagnosis not present

## 2023-01-28 DIAGNOSIS — H353221 Exudative age-related macular degeneration, left eye, with active choroidal neovascularization: Secondary | ICD-10-CM | POA: Diagnosis not present

## 2023-01-29 ENCOUNTER — Ambulatory Visit: Payer: Medicare HMO | Attending: Cardiology | Admitting: *Deleted

## 2023-01-29 DIAGNOSIS — Z5181 Encounter for therapeutic drug level monitoring: Secondary | ICD-10-CM | POA: Diagnosis not present

## 2023-01-29 DIAGNOSIS — I4891 Unspecified atrial fibrillation: Secondary | ICD-10-CM | POA: Diagnosis not present

## 2023-01-29 LAB — POCT INR: INR: 1.7 — AB (ref 2.0–3.0)

## 2023-01-29 NOTE — Patient Instructions (Signed)
Description   Today take 1 tablet of warfarin today then continue taking Warfarin 1/2 tablet daily. Stay consistent with greens (2-3 per week)  Recheck INR in 4 weeks. Coumadin Clinic [phone] or [phone]

## 2023-02-01 ENCOUNTER — Other Ambulatory Visit (HOSPITAL_COMMUNITY): Payer: Self-pay

## 2023-02-05 ENCOUNTER — Telehealth: Payer: Self-pay | Admitting: Pulmonary Disease

## 2023-02-05 ENCOUNTER — Other Ambulatory Visit (HOSPITAL_COMMUNITY): Payer: Self-pay

## 2023-02-05 ENCOUNTER — Telehealth: Payer: Self-pay

## 2023-02-05 NOTE — Telephone Encounter (Signed)
*  Pulm  PA request received for Stiolto Respimat 2.5-2.5MCG/ACT aerosol  PA has been submitted to [facility] via [facility] and is pending additional questions/determination  Key: [id number]

## 2023-02-05 NOTE — Telephone Encounter (Signed)
PA has been APPROVED from 02/05/2023-07/29/2023

## 2023-02-05 NOTE — Telephone Encounter (Signed)
Calling in prior auth details for Stiolto Respimat inhaler  They needs more info to complete req  Ref # [id number]

## 2023-02-06 ENCOUNTER — Other Ambulatory Visit: Payer: Self-pay | Admitting: Adult Health

## 2023-02-06 DIAGNOSIS — Z760 Encounter for issue of repeat prescription: Secondary | ICD-10-CM

## 2023-02-07 NOTE — Telephone Encounter (Signed)
Pt sister called in b/c PA was approved for the Stiolto Respimat inhaler without a co pay Pharmacy: [facility]

## 2023-02-21 ENCOUNTER — Telehealth: Payer: Self-pay | Admitting: Pulmonary Disease

## 2023-02-21 NOTE — Telephone Encounter (Signed)
Pt sister called in bc she is updating his ins Producer, television/film/video through [place]

## 2023-02-25 ENCOUNTER — Ambulatory Visit (INDEPENDENT_AMBULATORY_CARE_PROVIDER_SITE_OTHER): Payer: Medicare HMO | Admitting: Adult Health

## 2023-02-25 ENCOUNTER — Encounter: Payer: Self-pay | Admitting: Adult Health

## 2023-02-25 VITALS — BP 162/84 | HR 105 | Ht 64.0 in | Wt 130.0 lb

## 2023-02-25 DIAGNOSIS — R569 Unspecified convulsions: Secondary | ICD-10-CM

## 2023-02-25 DIAGNOSIS — Z8673 Personal history of transient ischemic attack (TIA), and cerebral infarction without residual deficits: Secondary | ICD-10-CM

## 2023-02-25 MED ORDER — PHENYTOIN SODIUM EXTENDED 100 MG PO CAPS: 300.0000 mg | ORAL_CAPSULE | Freq: Every day | ORAL | 3 refills | Status: DC

## 2023-02-25 MED ORDER — LEVETIRACETAM 500 MG PO TABS: 500.0000 mg | ORAL_TABLET | Freq: Two times a day (BID) | ORAL | 3 refills | Status: DC

## 2023-02-25 NOTE — Patient Instructions (Signed)
Your Plan:  Continue Keppra and Dilantin at current dosages  Please avoid seizure provoking triggers/activity  Please call with any recurrent seizure activity  Continue to follow with PCP and cardiology for aggressive stroke risk factor management     Follow-up in 1 year or call earlier if needed     Thank you for coming to see [place] at [facility]. I hope we have been able to provide you high quality care today.  You may receive a patient satisfaction survey over the next few weeks. We would appreciate your feedback and comments so that we may continue to improve ourselves and the health of our patients.
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
## 2023-02-26 ENCOUNTER — Ambulatory Visit: Payer: Medicare HMO | Attending: Cardiology

## 2023-02-26 DIAGNOSIS — Z5181 Encounter for therapeutic drug level monitoring: Secondary | ICD-10-CM

## 2023-02-26 DIAGNOSIS — I4891 Unspecified atrial fibrillation: Secondary | ICD-10-CM

## 2023-02-26 LAB — POCT INR: INR: 2.3 (ref 2.0–3.0)

## 2023-02-26 NOTE — Patient Instructions (Signed)
Today take 1 tablet of warfarin  then continue taking Warfarin 1/2 tablet daily. Stay consistent with greens (2-3 per week)  Recheck INR in 4 weeks. Coumadin Clinic [phone] or [phone]

## 2023-02-27 DIAGNOSIS — H353221 Exudative age-related macular degeneration, left eye, with active choroidal neovascularization: Secondary | ICD-10-CM | POA: Diagnosis not present

## 2023-02-27 MED ORDER — STIOLTO RESPIMAT 2.5-2.5 MCG/ACT IN AERS: 2.0000 | INHALATION_SPRAY | Freq: Every day | RESPIRATORY_TRACT | 5 refills | Status: DC

## 2023-02-27 NOTE — Telephone Encounter (Signed)
Pt. Pharmacy calling needing refills sent to them on Stiolto inhaler please advise [facility] is waiting

## 2023-02-27 NOTE — Telephone Encounter (Signed)
Rx sent to pharmacy   

## 2023-03-06 ENCOUNTER — Other Ambulatory Visit: Payer: Self-pay

## 2023-03-13 ENCOUNTER — Encounter (HOSPITAL_COMMUNITY): Payer: Self-pay

## 2023-03-13 ENCOUNTER — Emergency Department (HOSPITAL_COMMUNITY)
Admission: EM | Admit: 2023-03-13 | Discharge: 2023-03-14 | Disposition: A | Discharge status: Home / Self Care | Payer: Medicare HMO | Attending: Emergency Medicine | Admitting: Emergency Medicine

## 2023-03-13 ENCOUNTER — Other Ambulatory Visit: Payer: Self-pay

## 2023-03-13 ENCOUNTER — Emergency Department (HOSPITAL_COMMUNITY): Payer: Medicare HMO

## 2023-03-13 DIAGNOSIS — Z7901 Long term (current) use of anticoagulants: Secondary | ICD-10-CM | POA: Diagnosis not present

## 2023-03-13 DIAGNOSIS — J449 Chronic obstructive pulmonary disease, unspecified: Secondary | ICD-10-CM | POA: Insufficient documentation

## 2023-03-13 DIAGNOSIS — R600 Localized edema: Secondary | ICD-10-CM | POA: Insufficient documentation

## 2023-03-13 DIAGNOSIS — I509 Heart failure, unspecified: Secondary | ICD-10-CM | POA: Diagnosis not present

## 2023-03-13 DIAGNOSIS — R0789 Other chest pain: Secondary | ICD-10-CM | POA: Diagnosis not present

## 2023-03-13 DIAGNOSIS — R0602 Shortness of breath: Secondary | ICD-10-CM | POA: Insufficient documentation

## 2023-03-13 DIAGNOSIS — I700 Atherosclerosis of aorta: Secondary | ICD-10-CM | POA: Diagnosis not present

## 2023-03-13 DIAGNOSIS — R079 Chest pain, unspecified: Secondary | ICD-10-CM | POA: Diagnosis not present

## 2023-03-13 DIAGNOSIS — J439 Emphysema, unspecified: Secondary | ICD-10-CM | POA: Diagnosis not present

## 2023-03-13 DIAGNOSIS — J90 Pleural effusion, not elsewhere classified: Secondary | ICD-10-CM | POA: Diagnosis not present

## 2023-03-13 DIAGNOSIS — S299XXA Unspecified injury of thorax, initial encounter: Secondary | ICD-10-CM | POA: Diagnosis not present

## 2023-03-13 LAB — BASIC METABOLIC PANEL
Anion gap: 9 (ref 5–15)
BUN: 6 mg/dL — ABNORMAL LOW (ref 8–23)
CO2: 29 mmol/L (ref 22–32)
Calcium: 9.2 mg/dL (ref 8.9–10.3)
Chloride: 91 mmol/L — ABNORMAL LOW (ref 98–111)
Creatinine, Ser: 0.9 mg/dL (ref 0.61–1.24)
GFR, Estimated: >60 mL/min (ref >60)
Glucose, Bld: 114 mg/dL — ABNORMAL HIGH (ref 70–99)
Potassium: 5 mmol/L (ref 3.5–5.1)
Sodium: 129 mmol/L — ABNORMAL LOW (ref 135–145)

## 2023-03-13 LAB — CBC
HCT: 39.8 % (ref 39.0–52.0)
Hemoglobin: 13.3 g/dL (ref 13.0–17.0)
MCH: 32.5 pg (ref 26.0–34.0)
MCHC: 33.4 g/dL (ref 30.0–36.0)
MCV: 97.3 fL (ref 80.0–100.0)
Platelets: 218 10*3/uL (ref 150–400)
RBC: 4.09 MIL/uL — ABNORMAL LOW (ref 4.22–5.81)
RDW: 12.1 % (ref 11.5–15.5)
WBC: 7.7 10*3/uL (ref 4.0–10.5)
nRBC: 0 % (ref 0.0–0.2)

## 2023-03-13 LAB — TROPONIN I (HIGH SENSITIVITY): Troponin I (High Sensitivity): 12 ng/L (ref <18)

## 2023-03-13 LAB — PROTIME-INR
INR: 3.2 — ABNORMAL HIGH (ref 0.8–1.2)
Prothrombin Time: 33.3 seconds — ABNORMAL HIGH (ref 11.4–15.2)

## 2023-03-13 MED ORDER — FENTANYL CITRATE PF 50 MCG/ML IJ SOSY
50.0000 ug | PREFILLED_SYRINGE | Freq: Once | INTRAMUSCULAR | Status: AC
  Administered 2023-03-13: 50 ug via INTRAVENOUS
  Filled 2023-03-13: qty 1

## 2023-03-13 MED ORDER — IOHEXOL 350 MG/ML SOLN
80.0000 mL | Freq: Once | INTRAVENOUS | Status: AC | PRN
  Administered 2023-03-13: 80 mL via INTRAVENOUS

## 2023-03-13 NOTE — ED Triage Notes (Signed)
Pt reports with sharp right chest pain/ rib pain for several days. Pt states that he take Coumadin.

## 2023-03-13 NOTE — ED Provider Notes (Signed)
[place] EMERGENCY DEPARTMENT AT [facility] Provider Note   CSN: [id number] Arrival date & time: 03/13/23  2029     History {Add pertinent medical, surgical, social history, OB history to HPI:1} Chief Complaint  Patient presents with   Chest Pain    THORSTEN BENNINGHOFF is a 77 y.o. male.  Patient is a 77 year old male with a history of atrial fibrillation on Coumadin, cardiomyopathy, COPD, mitral valve replacement, significant kyphosis who is presenting today with complaints of right-sided chest pain.  It has been present for about a week but he reports it is getting worse.  He reports it feels like a stabbing in the right side of his chest that is worse with any type of movement, deep breathing or coughing.  He has had some shortness of breath associated with it but denies any fever or productive cough.  He has no abdominal pain nausea or vomiting.  He has tried to take Tylenol at home without any improvement.  He denies any rashes or falls.  He has been compliant with his medication.  The history is provided by the patient and medical records.  Chest Pain      Home Medications Prior to Admission medications   Medication Sig Start Date End Date Taking? Authorizing Provider  albuterol (VENTOLIN HFA) 108 (90 Base) MCG/ACT inhaler Inhale 2 puffs into the lungs every 6 (six) hours as needed for wheezing or shortness of breath. 01/11/23   Omar Person, MD  cholecalciferol (VITAMIN D3) 25 MCG (1000 UNIT) tablet Take 1,000 Units by mouth daily.    [provider]  ezetimibe (ZETIA) 10 MG tablet TAKE 1 TABLET EVERY DAY 02/06/23   Nafziger, Kandee Keen, NP  levETIRAcetam (KEPPRA) 500 MG tablet Take 1 tablet (500 mg total) by mouth 2 (two) times daily. 02/25/23   Ihor Austin, NP  metoprolol tartrate (LOPRESSOR) 25 MG tablet TAKE 0.5 TABLET (12.5 MG TOTAL) BY MOUTH 2 (TWO) TIMES DAILY. 09/11/22   Nafziger, Kandee Keen, NP  Multiple Vitamins-Minerals (PRESERVISION AREDS 2+MULTI VIT PO)  Take 1 capsule by mouth daily.    [provider]  phenytoin (DILANTIN) 100 MG ER capsule Take 3 capsules (300 mg total) by mouth at bedtime. 02/25/23   Ihor Austin, NP  tamsulosin (FLOMAX) 0.4 MG CAPS capsule Take 0.4 mg by mouth daily.    [provider]  Tiotropium Bromide-Olodaterol (STIOLTO RESPIMAT) 2.5-2.5 MCG/ACT AERS Inhale 2 puffs into the lungs daily. 02/27/23   Martina Sinner, MD  tiZANidine (ZANAFLEX) 4 MG tablet TAKE 1 TABLET EVERY 6 HOURS AS NEEDED FOR MUSCLE SPASMS Patient taking differently: Take 4 mg by mouth every 6 (six) hours as needed for muscle spasms. 10/17/22   Nafziger, Kandee Keen, NP  traMADol (ULTRAM) 50 MG tablet TAKE 1 TABLET EVERY 8 HOURS AS NEEDED 12/31/22   Nafziger, Kandee Keen, NP  warfarin (COUMADIN) 6 MG tablet TAKE 1/2 TO 1 TABLET ONE TIME DAILY AS DIRECTED BY COUMADIN CLINIC Patient taking differently: Take 3 mg by mouth daily. TAKE 1 TABLET (3 MG) ONE TIME DAILY AS DIRECTED BY COUMADIN CLINIC 11/21/22   Jake Bathe, MD      Allergies    Ace inhibitors, Lisinopril, and Rosuvastatin    Review of Systems   Review of Systems  Cardiovascular:  Positive for chest pain.    Physical Exam Updated Vital Signs BP (!) 138/108 (BP Location: Right Arm)   Pulse 96   Temp 98.3 F (36.8 C) (Oral)   Resp (!) 23  Ht 5\' 4"  (1.626 m)   Wt 59 kg   SpO2 99%   BMI 22.33 kg/m  Physical Exam Vitals and nursing note reviewed.  Constitutional:      General: He is not in acute distress.    Appearance: He is well-developed.  HENT:     Head: Normocephalic and atraumatic.  Eyes:     Conjunctiva/sclera: Conjunctivae normal.     Pupils: Pupils are equal, round, and reactive to light.  Cardiovascular:     Rate and Rhythm: Normal rate and regular rhythm.     Heart sounds: No murmur heard. Pulmonary:     Effort: Pulmonary effort is normal. No respiratory distress.     Breath sounds: Normal breath sounds. No wheezing or rales.  Chest:     Chest wall:  Tenderness present.       Comments: Significant tenderness with palpation.  No rash noted Abdominal:     General: There is no distension.     Palpations: Abdomen is soft.     Tenderness: There is no abdominal tenderness. There is no guarding or rebound.  Musculoskeletal:        General: No tenderness. Normal range of motion.     Cervical back: Normal range of motion and neck supple.     Right lower leg: Edema present.     Left lower leg: Edema present.  Skin:    General: Skin is warm and dry.     Findings: No erythema or rash.  Neurological:     Mental Status: He is alert and oriented to person, place, and time. Mental status is at baseline.  Psychiatric:        Behavior: Behavior normal.     ED Results / Procedures / Treatments   Labs (all labs ordered are listed, but only abnormal results are displayed) Labs Reviewed  BASIC METABOLIC PANEL - Abnormal; Notable for the following components:      Result Value   Sodium 129 (*)    Chloride 91 (*)    Glucose, Bld 114 (*)    BUN 6 (*)    All other components within normal limits  CBC - Abnormal; Notable for the following components:   RBC 4.09 (*)    All other components within normal limits  PROTIME-INR - Abnormal; Notable for the following components:   Prothrombin Time 33.3 (*)    INR 3.2 (*)    All other components within normal limits  PHENYTOIN LEVEL, TOTAL  TROPONIN I (HIGH SENSITIVITY)    EKG EKG Interpretation Date/Time:  Wednesday March 13 2023 20:54:11 EDT Ventricular Rate:  97 PR Interval:  195 QRS Duration:  107 QT Interval:  313 QTC Calculation: 398 R Axis:   104  Text Interpretation: Atrial fibrillation Ventricular premature complex Right axis deviation Nonspecific repol abnormality, lateral leads Baseline wander in lead(s) I III aVL No significant change since last tracing Confirmed by Gwyneth Sprout ([id number]) on 03/13/2023 9:51:14 PM  Radiology DG Chest 2 View  Result Date: 03/13/2023 CLINICAL  DATA:  Right-sided chest pain for several days, initial encounter EXAM: CHEST - 2 VIEW COMPARISON:  12/31/2022 FINDINGS: Cardiac shadow is stable. Postsurgical changes are again seen. Aortic calcifications are noted. No focal infiltrate is seen. Chronic blunting of left costophrenic angle is noted. These changes are predominately related to prior left lower lobectomy. IMPRESSION: Chronic changes without acute abnormality. Electronically Signed   By: Alcide Clever M.D.   On: 03/13/2023 21:26    Procedures Procedures  {Document  cardiac monitor, telemetry assessment procedure when appropriate:1}  Medications Ordered in ED Medications  fentaNYL (SUBLIMAZE) injection 50 mcg (has no administration in time range)    ED Course/ Medical Decision Making/ A&P   {   Click here for ABCD2, HEART and other calculatorsREFRESH Note before signing :1}                              Medical Decision Making Amount and/or Complexity of Data Reviewed Labs: ordered. Radiology: ordered.  Risk Prescription drug management.   Pt with multiple medical problems and comorbidities and presenting today with a complaint that caries a high risk for morbidity and mortality.  Here today with more pleuritic type pain in the right chest.  Pain is reproduced with palpation and movement.  He is complaining of some mild shortness of breath.  Low suspicion for infectious etiology at this time, ACS.  Low suspicion for dissection.  Concern for possible rib fracture, pneumothorax, mass.  Patient has no abdominal pain and low suspicion for abdominal pathology at this time.  No findings of zoster.  I independently interpreted patient's EKG and labs.  EKG without acute changes, BMP with persistent hyponatremia sodium of 129 several months ago was 130, stable creatinine, CBC without acute findings, troponin is negative and INR is therapeutic at 3.2.  I have independently visualized and interpreted pt's images today. CXR without acute  findings.  Will get CT to further evaluate for above.  Pt given pain control.   {Document critical care time when appropriate:1} {Document review of labs and clinical decision tools ie heart score, Chads2Vasc2 etc:1}  {Document your independent review of radiology images, and any outside records:1} {Document your discussion with family members, caretakers, and with consultants:1} {Document social determinants of health affecting pt's care:1} {Document your decision making why or why not admission, treatments were needed:1} Final Clinical Impression(s) / ED Diagnoses Final diagnoses:  None    Rx / DC Orders ED Discharge Orders     None

## 2023-03-14 ENCOUNTER — Ambulatory Visit: Payer: Medicare HMO | Admitting: Adult Health

## 2023-03-14 ENCOUNTER — Encounter: Payer: Self-pay | Admitting: Adult Health

## 2023-03-14 VITALS — BP 140/82 | HR 97 | Temp 98.3°F | Ht 64.0 in | Wt 134.0 lb

## 2023-03-14 DIAGNOSIS — M545 Low back pain, unspecified: Secondary | ICD-10-CM

## 2023-03-14 DIAGNOSIS — K458 Other specified abdominal hernia without obstruction or gangrene: Secondary | ICD-10-CM

## 2023-03-14 DIAGNOSIS — R0789 Other chest pain: Secondary | ICD-10-CM | POA: Diagnosis not present

## 2023-03-14 LAB — TROPONIN I (HIGH SENSITIVITY): Troponin I (High Sensitivity): 12 ng/L (ref <18)

## 2023-03-14 LAB — PHENYTOIN LEVEL, TOTAL: Phenytoin Lvl: 12.2 ug/mL (ref 10.0–20.0)

## 2023-03-14 MED ORDER — PREDNISONE 10 MG PO TABS: 10.0000 mg | ORAL_TABLET | Freq: Every day | ORAL | 0 refills | Status: DC

## 2023-03-14 MED ORDER — OXYCODONE-ACETAMINOPHEN 5-325 MG PO TABS: 1.0000 | ORAL_TABLET | Freq: Four times a day (QID) | ORAL | 0 refills | Status: DC | PRN

## 2023-03-14 MED ORDER — KETOROLAC TROMETHAMINE 15 MG/ML IJ SOLN
15.0000 mg | Freq: Once | INTRAMUSCULAR | Status: AC
  Administered 2023-03-14: 15 mg via INTRAVENOUS
  Filled 2023-03-14: qty 1

## 2023-03-14 MED ORDER — FENTANYL CITRATE PF 50 MCG/ML IJ SOSY
50.0000 ug | PREFILLED_SYRINGE | Freq: Once | INTRAMUSCULAR | Status: AC
  Administered 2023-03-14: 50 ug via INTRAVENOUS
  Filled 2023-03-14: qty 1

## 2023-03-14 NOTE — Patient Instructions (Signed)
I am sorry you are having so much pain in your back. I have sent in a 7 day course of prednisone, take this every morning to reduce inflammation.   I have also sent a referral to General Surgery about the hernia - They will call you to schedule an appointment

## 2023-03-14 NOTE — Discharge Instructions (Addendum)
You were seen today for chest pain.  Your workup is largely reassuring.  This may be related to chest wall pain.  Your CT scan does not show any evidence of rib fracture or other abnormality.  Take medication as prescribed.  Do not drive while taking narcotic pain medication.

## 2023-03-14 NOTE — Progress Notes (Signed)
Subjective:    Patient ID: Jeffery Copeland, male    DOB: 09/16/1945, 77 y.o.   MRN: [id number]  HPI  77 year old male who  has a past medical history of Acute and subacute bacterial endocarditis (1999), Atrial fibrillation (HCC) (11/11/2008), CARDIOMYOPATHY (10/20/2008), Cerebrovascular disease, unspecified, Closed right acetabular fracture (HCC), COPD (chronic obstructive pulmonary disease) (HCC) (1999), Epidural hematoma (HCC) (03/01/2011), Esophageal reflux, Generalized osteoarthrosis, unspecified site, History of tobacco abuse, Hypertension, HYPERTENSION, BENIGN (10/20/2008), Hyponatremia, Insomnia, Iron deficiency anemia, unspecified, ISCHEMIC COLITIS, HX OF (10/20/2008), Lung nodules (2008, 2014), Prostate cancer (HCC) (2015), Protruding sternal wires, S/P mitral valve replacement (02/01/1998), S/P mitral valve replacement with bioprosthetic valve (03/24/2013), Seizures (HCC), Severe mitral regurgitation (02/20/2013), and Unsteady gait.  He was seen in the emergency room yesterday for complaint of right-sided chest pain.  This pain had been present for about a week but was getting worse which brought him to the emergency room.  Reports that the pain felt like a stabbing pain on the right side of his chest that was worse with any type of movement, deep breathing, or coughing.  He was taking Tylenol at home without any improvement.  He had associated shortness of breath.  His EKG showed atrial fibrillation with ventricular premature mature complex with a rate of 97.  He is taking Coumadin for this.  Work showed persistent hyponatremia, stable creatinine, CBC without acute findings, troponin was negative and INR is therapeutic at 3.2.  He had a chest x-ray done without acute findings CT angio chest without any acute findings.  He was discharged home with Percocet.  Today he reports that his chest pain has resolved but now he has pain in his right lower back.  This pain is present with sitting, movement, and  breathing.  He has taken a Percocet but this only provided little relief.  He denies any trauma or falls since being seen in the emergency room earlier   Furthermore he was seen in the emergency room in early June 2024 due to abdominal pain.  He has known chronic hernia in his abdomen that usually resolves however at this time he had increasing cough over a few hours which led to the hernia protruding and causing pain.  CT scan showed no signs of incarcerated hernia and it appears as though his hernia reduced on its own.  He was advised to follow-up with [facility] surgery but never received a phone call and he would like to go through with this.   Review of Systems  See HPI    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   Recent fall 2012    Esophageal reflux    Generalized osteoarthrosis, unspecified site    History of tobacco abuse    Hypertension    HYPERTENSION, BENIGN 10/20/2008   Qualifier: Diagnosis of  By: Manson Passey, RN, BSN, Lauren     Hyponatremia    Insomnia    Iron deficiency anemia, unspecified    ISCHEMIC COLITIS, HX OF 10/20/2008   Qualifier: Diagnosis of  By:  Manson Passey, RN, BSN, Lauren     Lung nodules 2008, 2014   LLL nodule removed 2008 with LLL lobectomy,  RLL nodule 3mm observation   Prostate cancer (HCC) 2015   Protruding sternal wires    S/P mitral valve replacement 02/01/1998   Carpentier-Edwards porcine bioprosthetic tissue valve, size 31mm Placed for acute and subacute bacterial endocarditis (group G Streptococcus) with pre-existing  mitral valve prolapse    S/P mitral valve replacement with bioprosthetic valve 03/24/2013   Redo mitral valve replacement using 29mm Edwards Magna mitral bovine bioprosthetic tissue valve performed via right mini thoracotomy   Seizures (HCC)    last seizure 1 year ago   Severe mitral regurgitation 02/20/2013   Unsteady gait     Social History   Socioeconomic History   Marital status: Divorced    Spouse name: Not on file   Number of children: 2   Years of education: 9   Highest education level: Not on file  Occupational History   Occupation: disabled  Tobacco Use   Smoking status: Former    Current packs/day: 0.00    Average packs/day: 2.0 packs/day for 45.0 years (90.0 ttl pk-yrs)    Types: Cigarettes    Start date: 07/30/1961    Quit date: 07/30/2006    Years since quitting: 16.6   Smokeless tobacco: Never  Vaping Use   Vaping status: Never Used  Substance and Sexual Activity   Alcohol use: No    Alcohol/week: 0.0 standard drinks of alcohol    Comment: previous alcohol use & home-made alcohol consumption   Drug use: No   Sexual activity: Not Currently  Other Topics Concern   Not on file  Social History Narrative   Patient is divorced and has 2 children.   Patient is right handed.   Patient has 9 th grade education.    Patient drinks diet sodas.      [place] Pul[facility]:   From [place] originally. Always lived in [place]. 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 Questionable asbestos exposure. Previously traveled to [place], [place], & [place]. No pets currently. Remote pet owl and parakeet exposure. No mold exposure. Enjoys fishing.    Social Determinants of Health   Financial Resource Strain: Low Risk  (07/05/2022)   Overall Financial Resource Strain (CARDIA)    Difficulty of Paying Living Expenses: Not hard at all  Food Insecurity: No Food Insecurity (07/05/2022)   Hunger Vital Sign    Worried About Running Out of Food in the Last Year: Never true    Ran Out of Food in the Last Year: Never  true  Transportation Needs: No Transportation Needs (07/05/2022)   PRAPARE - Administrator, Civil Service (Medical): No    Lack of Transportation (Non-Medical): No  Physical Activity: Inactive (07/05/2022)   Exercise Vital Sign    Days of Exercise per Week: 0 days    Minutes of Exercise per Session: 0 min  Stress: No Stress Concern Present (07/05/2022)   [employer] of Occupational Health - Occupational Stress Questionnaire    Feeling of Stress : Not at all  Social Connections: Socially Isolated (07/05/2022)   Social Connection and Isolation Panel [NHANES]    Frequency of Communication with Friends and Family: More than three times a week    Frequency of Social Gatherings with Friends and Family: More than three times a week    Attends Religious Services: Never    Database administrator or Organizations: No    Attends [employer]  or Organization Meetings: Never    Marital Status: Divorced  Catering manager Violence: Not At Risk (07/05/2022)   Humiliation, Afraid, Rape, and Kick questionnaire    Fear of Current or Ex-Partner: No    Emotionally Abused: No    Physically Abused: No    Sexually Abused: No    Past Surgical History:  Procedure Laterality Date   CARDIAC VALVE REPLACEMENT     INTRAOPERATIVE TRANSESOPHAGEAL ECHOCARDIOGRAM N/A 03/24/2013   Procedure: INTRAOPERATIVE TRANSESOPHAGEAL ECHOCARDIOGRAM;  Surgeon: Purcell Nails, MD;  Location: [facility] OR;  Service: Open Heart Surgery;  Laterality: N/A;   MINIMALLY INVASIVE MAZE PROCEDURE N/A 03/24/2013   Procedure: MINIMALLY INVASIVE MAZE PROCEDURE;  Surgeon: Purcell Nails, MD;  Location: [facility] OR;  Service: Open Heart Surgery;  Laterality: N/A;   MITRAL VALVE REPLACEMENT  02/01/1998   Carpentier-Edwards porcine bioprosthetic tissue valve, size 31mm, placed for complicated bacterial endocarditis   MITRAL VALVE REPLACEMENT Right 03/24/2013   Procedure: MINIMALLY INVASIVE REDO MITRAL VALVE (MV) REPLACEMENT;  Surgeon: Purcell Nails,  MD;  Location: [facility] OR;  Service: Open Heart Surgery;  Laterality: Right;   PROSTATE BIOPSY     radiation treatment tx for prostate cancer  2015   STERNAL WIRES REMOVAL N/A 07/10/2018   Procedure: STERNAL WIRES REMOVAL;  Surgeon: Purcell Nails, MD;  Location: [facility] OR;  Service: Thoracic;  Laterality: N/A;   TEE WITHOUT CARDIOVERSION N/A 02/12/2013   Procedure: TRANSESOPHAGEAL ECHOCARDIOGRAM (TEE);  Surgeon: Pricilla Riffle, MD;  Location: [facility] ENDOSCOPY;  Service: Cardiovascular;  Laterality: N/A;   VIDEO ASSISTED THORACOSCOPY  04/29/2007   Left VATS w/ mini thoracotomy for Left Lower Lobectomy for benign lung nodules    Family History  Problem Relation Age of Onset   Cancer Father        prostate cancer   Tuberculosis Father    Heart attack Father    Cancer Brother        prostate cancer   Arthritis Mother    Lung disease Neg Hx     Allergies  Allergen Reactions   Ace Inhibitors Swelling   Lisinopril     Seizures    Rosuvastatin Other (See Comments)    Muscle aches    Current Outpatient Medications on File Prior to Visit  Medication Sig Dispense Refill   albuterol (VENTOLIN HFA) 108 (90 Base) MCG/ACT inhaler Inhale 2 puffs into the lungs every 6 (six) hours as needed for wheezing or shortness of breath. 8 g 6   cholecalciferol (VITAMIN D3) 25 MCG (1000 UNIT) tablet Take 1,000 Units by mouth daily.     ezetimibe (ZETIA) 10 MG tablet TAKE 1 TABLET EVERY DAY 90 tablet 3   levETIRAcetam (KEPPRA) 500 MG tablet Take 1 tablet (500 mg total) by mouth 2 (two) times daily. 180 tablet 3   metoprolol tartrate (LOPRESSOR) 25 MG tablet TAKE 0.5 TABLET (12.5 MG TOTAL) BY MOUTH 2 (TWO) TIMES DAILY. 90 tablet 3   Multiple Vitamins-Minerals (PRESERVISION AREDS 2+MULTI VIT PO) Take 1 capsule by mouth daily.     oxyCODONE-acetaminophen (PERCOCET/ROXICET) 5-325 MG tablet Take 1 tablet by mouth every 6 (six) hours as needed for severe pain. 10 tablet 0   phenytoin (DILANTIN) 100 MG ER capsule Take 3  capsules (300 mg total) by mouth at bedtime. 270 capsule 3   tamsulosin (FLOMAX) 0.4 MG CAPS capsule Take 0.4 mg by mouth daily.     Tiotropium Bromide-Olodaterol (STIOLTO RESPIMAT) 2.5-2.5 MCG/ACT AERS Inhale 2 puffs into the lungs daily. 4  g 5   tiZANidine (ZANAFLEX) 4 MG tablet TAKE 1 TABLET EVERY 6 HOURS AS NEEDED FOR MUSCLE SPASMS (Patient taking differently: Take 4 mg by mouth every 6 (six) hours as needed for muscle spasms.) 360 tablet 3   traMADol (ULTRAM) 50 MG tablet TAKE 1 TABLET EVERY 8 HOURS AS NEEDED 90 tablet 2   warfarin (COUMADIN) 6 MG tablet TAKE 1/2 TO 1 TABLET ONE TIME DAILY AS DIRECTED BY COUMADIN CLINIC (Patient taking differently: Take 3 mg by mouth daily. TAKE 1 TABLET (3 MG) ONE TIME DAILY AS DIRECTED BY COUMADIN CLINIC) 90 tablet 1   No current facility-administered medications on file prior to visit.    BP (!) 140/82   Pulse 97   Temp 98.3 F (36.8 C) (Oral)   Ht 5\' 4"  (1.626 m)   Wt 134 lb (60.8 kg)   SpO2 91%   BMI 23.00 kg/m       Objective:   Physical Exam Vitals and nursing note reviewed.  Constitutional:      Appearance: Normal appearance.  Cardiovascular:     Rate and Rhythm: Normal rate and regular rhythm.     Pulses: Normal pulses.     Heart sounds: Normal heart sounds.  Pulmonary:     Effort: Pulmonary effort is normal.     Breath sounds: Normal breath sounds.  Abdominal:     General: Abdomen is flat. Bowel sounds are normal.     Palpations: Abdomen is soft.     Hernia: A hernia (no pain with palpation. Easily reducable) is present.  Musculoskeletal:     Cervical back: Deformity (significant kyphosis) present.     Thoracic back: Normal.     Lumbar back: Tenderness present. No deformity or spasms.  Neurological:     Mental Status: He is alert.       Assessment & Plan:  1. Acute right-sided low back pain without sciatica -Not having low back pain, seems more muscular in nature.  Will send in prednisone that he can take daily for the  next 7 days and can continue with Percocet if needed.  Advise follow-up if not improving - predniSONE (DELTASONE) 10 MG tablet; Take 1 tablet (10 mg total) by mouth daily with breakfast.  Dispense: 7 tablet; Refill: 0  2. Other specified abdominal hernia without obstruction or gangrene -Advised that he is not a good surgical candidate and he understands this but would like to be referred to General Surgery for consultation.  - Ambulatory referral to General Surgery  Time spent with patient today was 31 minutes which consisted of chart review, discussing low back pain and abdominal hernia work up, treatment answering questions and documentation.  Shirline Frees, NP

## 2023-03-14 NOTE — ED Provider Notes (Signed)
Patient signed out pending CT scan.  CT scan is largely unremarkable.  Repeat troponin is also flat and reassuring.  He sees cardiology.  Will discharge with a short course of pain medication for likely musculoskeletal pain and recommend follow-up with Dr. Anne Fu.   Shon Baton, MD 03/14/23 [phone]

## 2023-03-21 ENCOUNTER — Telehealth: Payer: Self-pay

## 2023-03-21 NOTE — Telephone Encounter (Signed)
Transition Care Management Follow-up Telephone Call Date of discharge and from where: 03/14/2023 [facility] How have you been since you were released from the hospital? Patient stated he beginning to feel a little better. Any questions or concerns? No  Items Reviewed: Did the pt receive and understand the discharge instructions provided? Yes  Medications obtained and verified? Yes  Other? No  Any new allergies since your discharge? No  Dietary orders reviewed? Yes Do you have support at home? Yes   Follow up appointments reviewed:  PCP Hospital f/u appt confirmed? Yes  Scheduled to see Shirline Frees, NP on 03/14/2023 @ [facility] at [place]. Specialist Hospital f/u appt confirmed? Yes  Scheduled to see Christiane Ha B. Francine Graven, MD on 04/23/2023 @ [facility] [place]. Are transportation arrangements needed? No  If their condition worsens, is the pt aware to call PCP or go to the Emergency Dept.? Yes Was the patient provided with contact information for the PCP's office or ED? Yes Was to pt encouraged to call back with questions or concerns? Yes  Irini Leet [facility] Population Health Community Resource Care Guide   [email or web address]  ?? [phone]   Website: [email or web address]  [email or web address]

## 2023-03-26 ENCOUNTER — Ambulatory Visit: Payer: Medicare HMO | Attending: Cardiovascular Disease | Admitting: *Deleted

## 2023-03-26 DIAGNOSIS — Z5181 Encounter for therapeutic drug level monitoring: Secondary | ICD-10-CM

## 2023-03-26 DIAGNOSIS — I4891 Unspecified atrial fibrillation: Secondary | ICD-10-CM | POA: Diagnosis not present

## 2023-03-26 LAB — POCT INR: POC INR: 3.3

## 2023-03-26 NOTE — Patient Instructions (Addendum)
Description   Continue taking Warfarin 1/2 tablet daily. Stay consistent with greens (2-3 per week)  Recheck INR in 4 weeks. Coumadin Clinic [phone] or [phone]

## 2023-04-03 DIAGNOSIS — K439 Ventral hernia without obstruction or gangrene: Secondary | ICD-10-CM | POA: Diagnosis not present

## 2023-04-23 ENCOUNTER — Ambulatory Visit (INDEPENDENT_AMBULATORY_CARE_PROVIDER_SITE_OTHER): Payer: Medicare HMO | Admitting: Pulmonary Disease

## 2023-04-23 ENCOUNTER — Encounter: Payer: Self-pay | Admitting: Pulmonary Disease

## 2023-04-23 VITALS — BP 128/78 | HR 102 | Temp 98.2°F | Ht 64.0 in | Wt 140.0 lb

## 2023-04-23 DIAGNOSIS — I509 Heart failure, unspecified: Secondary | ICD-10-CM | POA: Diagnosis not present

## 2023-04-23 DIAGNOSIS — J441 Chronic obstructive pulmonary disease with (acute) exacerbation: Secondary | ICD-10-CM

## 2023-04-23 MED ORDER — PREDNISONE 10 MG PO TABS
ORAL_TABLET | ORAL | 0 refills | Status: AC
Start: 2023-04-23 — End: 2023-05-04

## 2023-04-23 MED ORDER — AZITHROMYCIN 250 MG PO TABS: ORAL_TABLET | ORAL | 0 refills | Status: DC

## 2023-04-23 MED ORDER — BREZTRI AEROSPHERE 160-9-4.8 MCG/ACT IN AERO: 2.0000 | INHALATION_SPRAY | Freq: Two times a day (BID) | RESPIRATORY_TRACT | Status: DC

## 2023-04-23 NOTE — Progress Notes (Signed)
Synopsis: Former patient of Dr. Thora Lance, hx of COPD  Subjective:   PATIENT ID: Jeffery Copeland: male DOB: 1946/04/13, MRN: [id number]   HPI  Chief Complaint  Patient presents with   Follow-up    Former pt of Dr. Thora Lance. Breathing worse since started on Stiolto at the last visit. He was winded walking from lobby to exam room today.    Jeffery Copeland is a 77 year old male, former smoker with left lower lobe nodule s/p lobectomy and COPD who returns to pulmonary clinic for follow up.   He continues to have significant shortness of breath despite being started on stiolto at last visit with Dr. Thora Lance. He is short of breath walking from lobby to exam room. He has increased wheezing and cough.  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   Recent fall 2012    Esophageal reflux    Generalized osteoarthrosis, unspecified site    History of tobacco abuse    Hypertension    HYPERTENSION, BENIGN 10/20/2008   Qualifier: Diagnosis of  By: Manson Passey, RN, BSN, Lauren     Hyponatremia    Insomnia    Iron deficiency anemia, unspecified    ISCHEMIC COLITIS, HX OF 10/20/2008   Qualifier: Diagnosis of  By: Manson Passey, RN, BSN, Lauren     Lung nodules 2008, 2014   LLL nodule removed 2008 with LLL lobectomy,  RLL nodule 3mm observation   Prostate cancer (HCC) 2015   Protruding sternal wires    S/P mitral valve replacement 02/01/1998   Carpentier-Edwards porcine bioprosthetic tissue valve, size 31mm Placed for acute and subacute bacterial endocarditis (group G Streptococcus) with  pre-existing mitral valve prolapse    S/P mitral valve replacement with bioprosthetic valve 03/24/2013   Redo mitral valve replacement using 29mm Edwards Magna mitral bovine bioprosthetic tissue valve performed via right mini thoracotomy   Seizures (HCC)    last seizure 1 year ago   Severe mitral regurgitation 02/20/2013   Unsteady gait      Family History  Problem Relation Age of Onset   Cancer Father        prostate cancer   Tuberculosis Father    Heart attack Father    Cancer Brother        prostate cancer   Arthritis Mother    Lung disease Neg Hx      Social History   Socioeconomic History   Marital status: Divorced    Spouse name: Not on file   Number of children: 2   Years of education: 9   Highest education level: Not on file  Occupational History   Occupation: disabled  Tobacco Use   Smoking status: Former    Current packs/day: 0.00    Average packs/day: 2.0 packs/day for 45.0 years (90.0 ttl pk-yrs)    Types: Cigarettes    Start date: 07/30/1961    Quit date: 07/30/2006    Years since quitting: 16.7   Smokeless tobacco: Never  Vaping Use   Vaping status: Never Used  Substance and Sexual Activity  Alcohol use: No    Alcohol/week: 0.0 standard drinks of alcohol    Comment: previous alcohol use & home-made alcohol consumption   Drug use: No   Sexual activity: Not Currently  Other Topics Concern   Not on file  Social History Narrative   Patient is divorced and has 2 children.   Patient is right handed.   Patient has 9 th grade education.    Patient drinks diet sodas.      [facility]:   From [place] originally. Always lived in [place]. 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 Questionable asbestos exposure. Previously traveled to [place], [place], & [place]. No pets currently. Remote pet owl and parakeet exposure. No mold exposure. Enjoys fishing.    Social Determinants of Health   Financial Resource Strain: Low Risk  (07/05/2022)   Overall Financial Resource Strain  (CARDIA)    Difficulty of Paying Living Expenses: Not hard at all  Food Insecurity: No Food Insecurity (07/05/2022)   Hunger Vital Sign    Worried About Running Out of Food in the Last Year: Never true    Ran Out of Food in the Last Year: Never true  Transportation Needs: No Transportation Needs (07/05/2022)   PRAPARE - Administrator, Civil Service (Medical): No    Lack of Transportation (Non-Medical): No  Physical Activity: Inactive (07/05/2022)   Exercise Vital Sign    Days of Exercise per Week: 0 days    Minutes of Exercise per Session: 0 min  Stress: No Stress Concern Present (07/05/2022)   [employer] of Occupational Health - Occupational Stress Questionnaire    Feeling of Stress : Not at all  Social Connections: Socially Isolated (07/05/2022)   Social Connection and Isolation Panel [NHANES]    Frequency of Communication with Friends and Family: More than three times a week    Frequency of Social Gatherings with Friends and Family: More than three times a week    Attends Religious Services: Never    Database administrator or Organizations: No    Attends Banker Meetings: Never    Marital Status: Divorced  Catering manager Violence: Not At Risk (07/05/2022)   Humiliation, Afraid, Rape, and Kick questionnaire    Fear of Current or Ex-Partner: No    Emotionally Abused: No    Physically Abused: No    Sexually Abused: No     Allergies  Allergen Reactions   Ace Inhibitors Swelling   Lisinopril     Seizures    Rosuvastatin Other (See Comments)    Muscle aches     Outpatient Medications Prior to Visit  Medication Sig Dispense Refill   albuterol (VENTOLIN HFA) 108 (90 Base) MCG/ACT inhaler Inhale 2 puffs into the lungs every 6 (six) hours as needed for wheezing or shortness of breath. 8 g 6   cholecalciferol (VITAMIN D3) 25 MCG (1000 UNIT) tablet Take 1,000 Units by mouth daily.     ezetimibe (ZETIA) 10 MG tablet TAKE 1 TABLET EVERY DAY 90 tablet 3    levETIRAcetam (KEPPRA) 500 MG tablet Take 1 tablet (500 mg total) by mouth 2 (two) times daily. 180 tablet 3   metoprolol tartrate (LOPRESSOR) 25 MG tablet TAKE 0.5 TABLET (12.5 MG TOTAL) BY MOUTH 2 (TWO) TIMES DAILY. 90 tablet 3   Multiple Vitamins-Minerals (PRESERVISION AREDS 2+MULTI VIT PO) Take 1 capsule by mouth daily.     oxyCODONE-acetaminophen (PERCOCET/ROXICET) 5-325 MG tablet Take 1 tablet by mouth every 6 (six) hours as needed  for severe pain. 10 tablet 0   phenytoin (DILANTIN) 100 MG ER capsule Take 3 capsules (300 mg total) by mouth at bedtime. 270 capsule 3   tamsulosin (FLOMAX) 0.4 MG CAPS capsule Take 0.4 mg by mouth daily.     Tiotropium Bromide-Olodaterol (STIOLTO RESPIMAT) 2.5-2.5 MCG/ACT AERS Inhale 2 puffs into the lungs daily. 4 g 5   tiZANidine (ZANAFLEX) 4 MG tablet TAKE 1 TABLET EVERY 6 HOURS AS NEEDED FOR MUSCLE SPASMS (Patient taking differently: Take 4 mg by mouth every 6 (six) hours as needed for muscle spasms.) 360 tablet 3   traMADol (ULTRAM) 50 MG tablet TAKE 1 TABLET EVERY 8 HOURS AS NEEDED 90 tablet 2   warfarin (COUMADIN) 6 MG tablet TAKE 1/2 TO 1 TABLET ONE TIME DAILY AS DIRECTED BY COUMADIN CLINIC (Patient taking differently: Take 3 mg by mouth daily. TAKE 1 TABLET (3 MG) ONE TIME DAILY AS DIRECTED BY COUMADIN CLINIC) 90 tablet 1   predniSONE (DELTASONE) 10 MG tablet Take 1 tablet (10 mg total) by mouth daily with breakfast. 7 tablet 0   No facility-administered medications prior to visit.    Review of Systems  Constitutional:  Negative for chills, fever, malaise/fatigue and weight loss.  HENT:  Negative for congestion, sinus pain and sore throat.   Eyes: Negative.   Respiratory:  Positive for shortness of breath. Negative for cough, hemoptysis, sputum production and wheezing.   Cardiovascular:  Negative for chest pain, palpitations, orthopnea, claudication and leg swelling.  Gastrointestinal:  Negative for abdominal pain, heartburn, nausea and vomiting.   Genitourinary: Negative.   Musculoskeletal:  Negative for joint pain and myalgias.  Skin:  Negative for rash.  Neurological:  Negative for weakness.  Endo/Heme/Allergies: Negative.   Psychiatric/Behavioral: Negative.        Objective:   Vitals:   04/23/23 1021  BP: 128/78  Pulse: (!) 102  Temp: 98.2 F (36.8 C)  TempSrc: Oral  SpO2: 94%  Weight: 140 lb (63.5 kg)  Height: 5\' 4"  (1.626 m)     Physical Exam Constitutional:      General: He is not in acute distress.    Appearance: Normal appearance.  Eyes:     General: No scleral icterus.    Conjunctiva/sclera: Conjunctivae normal.  Cardiovascular:     Rate and Rhythm: Normal rate and regular rhythm.  Pulmonary:     Breath sounds: Wheezing present. No rhonchi or rales.  Musculoskeletal:     Right lower leg: No edema.     Left lower leg: No edema.  Skin:    General: Skin is warm and dry.  Neurological:     General: No focal deficit present.     CBC    Component Value Date/Time   WBC 7.7 03/13/2023 2108   RBC 4.09 (L) 03/13/2023 2108   HGB 13.3 03/13/2023 2108   HGB 12.4 (L) 02/21/2021 0000   HCT 39.8 03/13/2023 2108   HCT 35.9 (L) 02/21/2021 0000   PLT 218 03/13/2023 2108   PLT 189 02/21/2021 0000   MCV 97.3 03/13/2023 2108   MCV 96 02/21/2021 0000   MCH 32.5 03/13/2023 2108   MCHC 33.4 03/13/2023 2108   RDW 12.1 03/13/2023 2108   RDW 11.5 (L) 02/21/2021 0000   LYMPHSABS 0.6 (L) 12/31/2022 0108   LYMPHSABS 0.6 (L) 10/22/2017 1050   MONOABS 1.0 12/31/2022 0108   EOSABS 0.0 12/31/2022 0108   EOSABS 0.1 10/22/2017 1050   BASOSABS 0.1 12/31/2022 0108   BASOSABS 0.0 10/22/2017 1050  Latest Ref Rng & Units 03/13/2023    9:08 PM 12/31/2022    1:08 AM 08/15/2022   11:05 AM  BMP  Glucose 70 - 99 mg/dL 132  440  99   BUN 8 - 23 mg/dL 6  11  9    Creatinine 0.61 - 1.24 mg/dL 1.02  7.25  3.66   Sodium 135 - 145 mmol/L 129  130  134   Potassium 3.5 - 5.1 mmol/L 5.0  3.9  3.4   Chloride 98 - 111 mmol/L  91  95  98   CO2 22 - 32 mmol/L 29  26  24    Calcium 8.9 - 10.3 mg/dL 9.2  9.1  8.8    Chest imaging: CTA Chest 03/13/23 Mediastinum/Nodes: Thoracic inlet is within normal limits. No hilar or mediastinal adenopathy is noted. The esophagus is air-filled and within normal limits.   Lungs/Pleura: Lungs are well aerated bilaterally. Diffuse emphysematous changes are seen. Findings consistent with prior left lower lobectomy are noted and stable. Loculated left basilar effusion is noted similar to that seen on prior exam. No focal infiltrate or sizable effusion parenchymal nodule is noted. Calcified pleural plaques are noted particularly on the right.  PFT:    Latest Ref Rng & Units 11/06/2016   10:25 AM  PFT Results  FVC-Pre L 2.57   FVC-Predicted Pre % 71   FVC-Post L 2.62   FVC-Predicted Post % 72   Pre FEV1/FVC % % 59   Post FEV1/FCV % % 61   FEV1-Pre L 1.52   FEV1-Predicted Pre % 58   FEV1-Post L 1.60   DLCO uncorrected ml/min/mmHg 9.07   DLCO UNC% % 35   DLCO corrected ml/min/mmHg 9.81   DLCO COR %Predicted % 38   DLVA Predicted % 64     Labs:  Path:  Echo:  Heart Catheterization:       Assessment & Plan:   COPD with acute exacerbation (HCC) - Plan: predniSONE (DELTASONE) 10 MG tablet, azithromycin (ZITHROMAX) 250 MG tablet  Discussion: Jeffery Copeland is a 77 year old male, former smoker with left lower lobe nodule s/p lobectomy and COPD who returns to pulmonary clinic for follow up.   He has acute COPD exacerbation. Will treat with extended steroid taper and azithromycin.   Try breztrin inhaler 2 puffs twice daily and continue to use albuterol inhaler as needed.  Follow up in 6 months.   Melody Comas, MD [facility] Office: [phone]    Current Outpatient Medications:    albuterol (VENTOLIN HFA) 108 (90 Base) MCG/ACT inhaler, Inhale 2 puffs into the lungs every 6 (six) hours as needed for wheezing or shortness of breath.,  Disp: 8 g, Rfl: 6   azithromycin (ZITHROMAX) 250 MG tablet, Take as directed, Disp: 6 tablet, Rfl: 0   Budeson-Glycopyrrol-Formoterol (BREZTRI AEROSPHERE) 160-9-4.8 MCG/ACT AERO, Inhale 2 puffs into the lungs in the morning and at bedtime., Disp: , Rfl:    cholecalciferol (VITAMIN D3) 25 MCG (1000 UNIT) tablet, Take 1,000 Units by mouth daily., Disp: , Rfl:    ezetimibe (ZETIA) 10 MG tablet, TAKE 1 TABLET EVERY DAY, Disp: 90 tablet, Rfl: 3   levETIRAcetam (KEPPRA) 500 MG tablet, Take 1 tablet (500 mg total) by mouth 2 (two) times daily., Disp: 180 tablet, Rfl: 3   metoprolol tartrate (LOPRESSOR) 25 MG tablet, TAKE 0.5 TABLET (12.5 MG TOTAL) BY MOUTH 2 (TWO) TIMES DAILY., Disp: 90 tablet, Rfl: 3   Multiple Vitamins-Minerals (PRESERVISION AREDS 2+MULTI VIT PO),  Take 1 capsule by mouth daily., Disp: , Rfl:    oxyCODONE-acetaminophen (PERCOCET/ROXICET) 5-325 MG tablet, Take 1 tablet by mouth every 6 (six) hours as needed for severe pain., Disp: 10 tablet, Rfl: 0   phenytoin (DILANTIN) 100 MG ER capsule, Take 3 capsules (300 mg total) by mouth at bedtime., Disp: 270 capsule, Rfl: 3   predniSONE (DELTASONE) 10 MG tablet, Take 4 tablets (40 mg total) by mouth daily with breakfast for 3 days, THEN 3 tablets (30 mg total) daily with breakfast for 3 days, THEN 2 tablets (20 mg total) daily with breakfast for 3 days, THEN 1 tablet (10 mg total) daily with breakfast for 3 days., Disp: 30 tablet, Rfl: 0   tamsulosin (FLOMAX) 0.4 MG CAPS capsule, Take 0.4 mg by mouth daily., Disp: , Rfl:    Tiotropium Bromide-Olodaterol (STIOLTO RESPIMAT) 2.5-2.5 MCG/ACT AERS, Inhale 2 puffs into the lungs daily., Disp: 4 g, Rfl: 5   tiZANidine (ZANAFLEX) 4 MG tablet, TAKE 1 TABLET EVERY 6 HOURS AS NEEDED FOR MUSCLE SPASMS (Patient taking differently: Take 4 mg by mouth every 6 (six) hours as needed for muscle spasms.), Disp: 360 tablet, Rfl: 3   traMADol (ULTRAM) 50 MG tablet, TAKE 1 TABLET EVERY 8 HOURS AS NEEDED, Disp: 90 tablet,  Rfl: 2   warfarin (COUMADIN) 6 MG tablet, TAKE 1/2 TO 1 TABLET ONE TIME DAILY AS DIRECTED BY COUMADIN CLINIC (Patient taking differently: Take 3 mg by mouth daily. TAKE 1 TABLET (3 MG) ONE TIME DAILY AS DIRECTED BY COUMADIN CLINIC), Disp: 90 tablet, Rfl: 1

## 2023-04-23 NOTE — Patient Instructions (Addendum)
Start prednisone taper for your breathing 40mg  daily x 3 days 30mg  daily x 3 days 20mg  daily x 3 days 10mg  daily x 3 days  Try Breztri inhaler 2 puffs twice daily - rinse mouth out after each use  Continue to use albuterol inhaler 1-2 puffs evey 4-6 hours as needed  Follow up in 6 months

## 2023-04-24 ENCOUNTER — Ambulatory Visit: Payer: Medicare HMO | Attending: Cardiology

## 2023-04-24 DIAGNOSIS — Z5181 Encounter for therapeutic drug level monitoring: Secondary | ICD-10-CM | POA: Diagnosis not present

## 2023-04-24 DIAGNOSIS — I4891 Unspecified atrial fibrillation: Secondary | ICD-10-CM | POA: Diagnosis not present

## 2023-04-24 LAB — POCT INR: INR: 2.6 (ref 2.0–3.0)

## 2023-04-24 NOTE — Patient Instructions (Signed)
Description   Take 1 tablet today and then continue taking Warfarin 1/2 tablet daily.  Stay consistent with greens (2-3 per week)  Recheck INR in 5 weeks.  [facility] Clinic [phone] or [phone]

## 2023-05-01 ENCOUNTER — Encounter: Payer: Self-pay | Admitting: Pulmonary Disease

## 2023-05-04 ENCOUNTER — Other Ambulatory Visit: Payer: Self-pay | Admitting: Cardiology

## 2023-05-04 DIAGNOSIS — I4891 Unspecified atrial fibrillation: Secondary | ICD-10-CM

## 2023-05-13 ENCOUNTER — Telehealth: Payer: Self-pay | Admitting: Pulmonary Disease

## 2023-05-13 NOTE — Telephone Encounter (Signed)
Christine sister states patient needs refill for [employer]. Pharmacy is [facility] Patient out of the medication. Wynona Canes states patient son in not available. States is not at patient's house. Christine phone number is [phone].

## 2023-05-15 ENCOUNTER — Telehealth: Payer: Self-pay

## 2023-05-15 MED ORDER — BREZTRI AEROSPHERE 160-9-4.8 MCG/ACT IN AERO: 2.0000 | INHALATION_SPRAY | Freq: Two times a day (BID) | RESPIRATORY_TRACT | 1 refills | Status: DC

## 2023-05-15 NOTE — Telephone Encounter (Signed)
Refill sent.

## 2023-05-15 NOTE — Telephone Encounter (Signed)
DMV forms received, completed and placed on NP desk for review and signature.

## 2023-05-16 NOTE — Telephone Encounter (Signed)
Forms placed in MR for pick up. Pt has to complete his section of forms.

## 2023-05-16 NOTE — Telephone Encounter (Signed)
Thank you.  Patient does have history of cardiovascular disorder and this section will need to be completed by his cardiologist as well as history of respiratory disorder and will need this section completed by his pulmonologist.  Please advise patient.  Signed page 2 and completed neurological section.  Placed in outbox.

## 2023-05-17 DIAGNOSIS — H353221 Exudative age-related macular degeneration, left eye, with active choroidal neovascularization: Secondary | ICD-10-CM | POA: Diagnosis not present

## 2023-05-22 ENCOUNTER — Telehealth: Payer: Self-pay | Admitting: *Deleted

## 2023-05-22 NOTE — Telephone Encounter (Signed)
Pt dmv form faxed on 05/21/23

## 2023-05-29 ENCOUNTER — Ambulatory Visit: Payer: Medicare HMO | Attending: Cardiology | Admitting: *Deleted

## 2023-05-29 DIAGNOSIS — I4891 Unspecified atrial fibrillation: Secondary | ICD-10-CM | POA: Diagnosis not present

## 2023-05-29 DIAGNOSIS — Z5181 Encounter for therapeutic drug level monitoring: Secondary | ICD-10-CM

## 2023-05-29 LAB — POCT INR: INR: 1.3 — AB (ref 2.0–3.0)

## 2023-05-29 NOTE — Patient Instructions (Signed)
Description   Take 1 tablet today and tomorrow take 1 tablet tomorrow then continue taking Warfarin 1/2 tablet daily.  Stay consistent with greens (2-3 per week)  Recheck INR in 1 (normally 5 weeks).  [facility] [phone] or [phone]

## 2023-06-06 ENCOUNTER — Ambulatory Visit: Payer: Medicare HMO | Attending: Cardiology

## 2023-06-06 DIAGNOSIS — Z5181 Encounter for therapeutic drug level monitoring: Secondary | ICD-10-CM

## 2023-06-06 DIAGNOSIS — I4891 Unspecified atrial fibrillation: Secondary | ICD-10-CM

## 2023-06-06 LAB — POCT INR: INR: 1.9 — AB (ref 2.0–3.0)

## 2023-06-06 NOTE — Patient Instructions (Signed)
Description   Take 1 tablet today and then START taking Warfarin 1/2 tablet daily except 1 tablet on Sundays.  Stay consistent with greens (3 per week)  Recheck INR in 2 weeks (normally 5 weeks).  [facility] Clinic [phone] or [phone]

## 2023-06-13 ENCOUNTER — Telehealth: Payer: Self-pay | Admitting: Adult Health

## 2023-06-13 NOTE — Telephone Encounter (Signed)
Noted  

## 2023-06-13 NOTE — Telephone Encounter (Signed)
Patient dropped off document DMV, to be filled out by provider. Patient requested to send it back via Mail within 7-days. Document is located in providers tray at front office.Please advise at [facility] [phone]

## 2023-06-14 NOTE — Telephone Encounter (Signed)
Tried to call pt to notify of update but no answer.

## 2023-06-14 NOTE — Telephone Encounter (Signed)
Noted  

## 2023-06-18 NOTE — Telephone Encounter (Signed)
Left message to return phone call.

## 2023-06-19 NOTE — Telephone Encounter (Signed)
Left message to return phone call. Closing note. I will place the form upfront for pt to pick up.

## 2023-06-20 ENCOUNTER — Ambulatory Visit: Payer: Medicare HMO | Attending: Internal Medicine | Admitting: *Deleted

## 2023-06-20 DIAGNOSIS — I4891 Unspecified atrial fibrillation: Secondary | ICD-10-CM

## 2023-06-20 DIAGNOSIS — Z5181 Encounter for therapeutic drug level monitoring: Secondary | ICD-10-CM | POA: Diagnosis not present

## 2023-06-20 LAB — POCT INR: INR: 2.7 (ref 2.0–3.0)

## 2023-06-20 NOTE — Patient Instructions (Signed)
Description   Continue taking Warfarin 1/2 tablet daily except 1 tablet on Sundays.  Stay consistent with greens (3 per week)  Recheck INR in 3 weeks (normally 5 weeks).  [facility] Clinic [phone]

## 2023-06-27 ENCOUNTER — Other Ambulatory Visit: Payer: Self-pay | Admitting: Adult Health

## 2023-07-02 ENCOUNTER — Telehealth: Payer: Self-pay | Admitting: Pulmonary Disease

## 2023-07-02 NOTE — Telephone Encounter (Signed)
Christine sister states patient needs refill for [employer]. Pharmacy is [facility] mail order. Christine phone number is [phone].

## 2023-07-10 ENCOUNTER — Ambulatory Visit: Payer: Medicare HMO

## 2023-07-10 VITALS — BP 120/60 | HR 60 | Temp 98.3°F | Ht 64.0 in | Wt 136.6 lb

## 2023-07-10 DIAGNOSIS — Z0000 Encounter for general adult medical examination without abnormal findings: Secondary | ICD-10-CM | POA: Diagnosis not present

## 2023-07-10 NOTE — Progress Notes (Signed)
Subjective:   Jeffery Copeland is a 77 y.o. male who presents for Medicare Annual/Subsequent preventive examination.  Visit Complete: In person    Cardiac Risk Factors include: advanced age (>40men, >61 women);male gender;hypertension     Objective:    Today's Vitals   07/10/23 1302  BP: 120/60  Pulse: 60  Temp: 98.3 F (36.8 C)  TempSrc: Oral  SpO2: 90%  Weight: 136 lb 9.6 oz (62 kg)  Height: 5\' 4"  (1.626 m)   Body mass index is 23.45 kg/m.     03/13/2023    8:54 PM 12/31/2022   12:51 AM 07/05/2022   10:38 AM 06/26/2021    8:21 AM 06/20/2020    1:32 PM 04/09/2017    3:25 PM 02/03/2016   11:12 AM  Advanced Directives  Does Patient Have a Medical Advance Directive? No No Yes Yes Yes No No  Type of Best boy of [place];Living will Out of facility DNR (pink MOST or yellow form) Healthcare Power of [place];Living will    Does patient want to make changes to medical advance directive?   No - Patient declined  No - Patient declined    Copy of Healthcare Power of Attorney in Chart?   Yes - validated most recent copy scanned in chart (See row information)  Yes - validated most recent copy scanned in chart (See row information)    Would patient like information on creating a medical advance directive? No - Patient declined     No - Patient declined     Current Medications (verified) Outpatient Encounter Medications as of 07/10/2023  Medication Sig   albuterol (VENTOLIN HFA) 108 (90 Base) MCG/ACT inhaler Inhale 2 puffs into the lungs every 6 (six) hours as needed for wheezing or shortness of breath.   azithromycin (ZITHROMAX) 250 MG tablet Take as directed   Budeson-Glycopyrrol-Formoterol (BREZTRI AEROSPHERE) 160-9-4.8 MCG/ACT AERO Inhale 2 puffs into the lungs in the morning and at bedtime.   cholecalciferol (VITAMIN D3) 25 MCG (1000 UNIT) tablet Take 1,000 Units by mouth daily.   ezetimibe (ZETIA) 10 MG tablet TAKE 1 TABLET EVERY DAY   levETIRAcetam  (KEPPRA) 500 MG tablet Take 1 tablet (500 mg total) by mouth 2 (two) times daily.   metoprolol tartrate (LOPRESSOR) 25 MG tablet TAKE 1/2 TABLET TWICE DAILY   Multiple Vitamins-Minerals (PRESERVISION AREDS 2+MULTI VIT PO) Take 1 capsule by mouth daily.   oxyCODONE-acetaminophen (PERCOCET/ROXICET) 5-325 MG tablet Take 1 tablet by mouth every 6 (six) hours as needed for severe pain.   phenytoin (DILANTIN) 100 MG ER capsule Take 3 capsules (300 mg total) by mouth at bedtime.   tamsulosin (FLOMAX) 0.4 MG CAPS capsule Take 0.4 mg by mouth daily.   Tiotropium Bromide-Olodaterol (STIOLTO RESPIMAT) 2.5-2.5 MCG/ACT AERS Inhale 2 puffs into the lungs daily.   tiZANidine (ZANAFLEX) 4 MG tablet TAKE 1 TABLET EVERY 6 HOURS AS NEEDED FOR MUSCLE SPASMS (Patient taking differently: Take 4 mg by mouth every 6 (six) hours as needed for muscle spasms.)   traMADol (ULTRAM) 50 MG tablet TAKE 1 TABLET EVERY 8 HOURS AS NEEDED   warfarin (COUMADIN) 6 MG tablet TAKE 1/2 TO 1 TABLET ONE TIME DAILY AS DIRECTED BY COUMADIN CLINIC   No facility-administered encounter medications on file as of 07/10/2023.    Allergies (verified) Ace inhibitors, Lisinopril, and Rosuvastatin   History: Past Medical History:  Diagnosis Date   Acute and subacute bacterial endocarditis 1999   group G Streptococcus   Atrial  fibrillation (HCC) 11/11/2008   Qualifier: Diagnosis of  By: Riley Kill, MD, Johny Sax    CARDIOMYOPATHY 10/20/2008   Qualifier: Diagnosis of  By: Manson Passey, RN, BSN, Lauren     Cerebrovascular disease, unspecified    Closed right acetabular fracture (HCC)    COPD (chronic obstructive pulmonary disease) (HCC) 1999   Epidural hematoma (HCC) 03/01/2011   Recent fall 2012    Esophageal reflux    Generalized osteoarthrosis, unspecified site    History of tobacco abuse    Hypertension    HYPERTENSION, BENIGN 10/20/2008   Qualifier: Diagnosis of  By: Manson Passey, RN, BSN, Lauren     Hyponatremia    Insomnia    Iron  deficiency anemia, unspecified    ISCHEMIC COLITIS, HX OF 10/20/2008   Qualifier: Diagnosis of  By: Manson Passey, RN, BSN, Lauren     Lung nodules 2008, 2014   LLL nodule removed 2008 with LLL lobectomy,  RLL nodule 3mm observation   Prostate cancer (HCC) 2015   Protruding sternal wires    S/P mitral valve replacement 02/01/1998   Carpentier-Edwards porcine bioprosthetic tissue valve, size 31mm Placed for acute and subacute bacterial endocarditis (group G Streptococcus) with pre-existing mitral valve prolapse    S/P mitral valve replacement with bioprosthetic valve 03/24/2013   Redo mitral valve replacement using 29mm [facility] mitral bovine bioprosthetic tissue valve performed via right mini thoracotomy   Seizures (HCC)    last seizure 1 year ago   Severe mitral regurgitation 02/20/2013   Unsteady gait    Past Surgical History:  Procedure Laterality Date   CARDIAC VALVE REPLACEMENT     INTRAOPERATIVE TRANSESOPHAGEAL ECHOCARDIOGRAM N/A 03/24/2013   Procedure: INTRAOPERATIVE TRANSESOPHAGEAL ECHOCARDIOGRAM;  Surgeon: Purcell Nails, MD;  Location: [facility] OR;  Service: Open Heart Surgery;  Laterality: N/A;   MINIMALLY INVASIVE MAZE PROCEDURE N/A 03/24/2013   Procedure: MINIMALLY INVASIVE MAZE PROCEDURE;  Surgeon: Purcell Nails, MD;  Location: [facility] OR;  Service: Open Heart Surgery;  Laterality: N/A;   MITRAL VALVE REPLACEMENT  02/01/1998   Carpentier-Edwards porcine bioprosthetic tissue valve, size 31mm, placed for complicated bacterial endocarditis   MITRAL VALVE REPLACEMENT Right 03/24/2013   Procedure: MINIMALLY INVASIVE REDO MITRAL VALVE (MV) REPLACEMENT;  Surgeon: Purcell Nails, MD;  Location: [facility] OR;  Service: Open Heart Surgery;  Laterality: Right;   PROSTATE BIOPSY     radiation treatment tx for prostate cancer  2015   STERNAL WIRES REMOVAL N/A 07/10/2018   Procedure: STERNAL WIRES REMOVAL;  Surgeon: Purcell Nails, MD;  Location: [facility] OR;  Service: Thoracic;  Laterality: N/A;   TEE WITHOUT  CARDIOVERSION N/A 02/12/2013   Procedure: TRANSESOPHAGEAL ECHOCARDIOGRAM (TEE);  Surgeon: Pricilla Riffle, MD;  Location: [facility] ENDOSCOPY;  Service: Cardiovascular;  Laterality: N/A;   VIDEO ASSISTED THORACOSCOPY  04/29/2007   Left VATS w/ mini thoracotomy for Left Lower Lobectomy for benign lung nodules   Family History  Problem Relation Age of Onset   Cancer Father        prostate cancer   Tuberculosis Father    Heart attack Father    Cancer Brother        prostate cancer   Arthritis Mother    Lung disease Neg Hx    Social History   Socioeconomic History   Marital status: Divorced    Spouse name: Not on file   Number of children: 2   Years of education: 9   Highest education level: Not on file  Occupational History  Occupation: disabled  Tobacco Use   Smoking status: Former    Current packs/day: 0.00    Average packs/day: 2.0 packs/day for 45.0 years (90.0 ttl pk-yrs)    Types: Cigarettes    Start date: 07/30/1961    Quit date: 07/30/2006    Years since quitting: 16.9   Smokeless tobacco: Never  Vaping Use   Vaping status: Never Used  Substance and Sexual Activity   Alcohol use: No    Alcohol/week: 0.0 standard drinks of alcohol    Comment: previous alcohol use & home-made alcohol consumption   Drug use: No   Sexual activity: Not Currently  Other Topics Concern   Not on file  Social History Narrative   Patient is divorced and has 2 children.   Patient is right handed.   Patient has 9 th grade education.    Patient drinks diet sodas.      [place] [facility]:   From [place] originally. Always lived in [place]. 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 Questionable asbestos exposure. Previously traveled to [place], [place], & [place]. No pets currently. Remote pet owl and parakeet exposure. No mold exposure. Enjoys fishing.    Social Determinants of Health   Financial Resource Strain: Low Risk  (07/10/2023)   Overall Financial Resource Strain (CARDIA)    Difficulty of Paying Living Expenses: Not  hard at all  Food Insecurity: No Food Insecurity (07/10/2023)   Hunger Vital Sign    Worried About Running Out of Food in the Last Year: Never true    Ran Out of Food in the Last Year: Never true  Transportation Needs: No Transportation Needs (07/10/2023)   PRAPARE - Administrator, Civil Service (Medical): No    Lack of Transportation (Non-Medical): No  Physical Activity: Inactive (07/10/2023)   Exercise Vital Sign    Days of Exercise per Week: 0 days    Minutes of Exercise per Session: 0 min  Stress: No Stress Concern Present (07/10/2023)   [employer] of Occupational Health - Occupational Stress Questionnaire    Feeling of Stress : Not at all  Social Connections: Socially Isolated (07/10/2023)   Social Connection and Isolation Panel [NHANES]    Frequency of Communication with Friends and Family: More than three times a week    Frequency of Social Gatherings with Friends and Family: More than three times a week    Attends Religious Services: Never    Database administrator or Organizations: No    Attends Engineer, structural: Never    Marital Status: Divorced    Tobacco Counseling Counseling given: Not Answered   Clinical Intake:  Pre-visit preparation completed: Yes  Pain : No/denies pain     BMI - recorded: 23.45 Nutritional Status: BMI of 19-24  Normal Nutritional Risks: None Diabetes: No  How often do you need to have someone help you when you read instructions, pamphlets, or other written materials from your doctor or pharmacy?: 1 - Never  Interpreter Needed?: No  Information entered by :: Theresa Mulligan LON   Activities of Daily Living    07/10/2023    1:18 PM  In your present state of health, do you have any difficulty performing the following activities:  Hearing? 1  Comment Wears hearing aids  Vision? 0  Difficulty concentrating or making decisions? 0  Walking or climbing stairs? 1  Comment Uses a Walker  Dressing or  bathing? 0  Doing errands, shopping? 0  Preparing Food and eating ? N  Using the Toilet? N  In the past six months, have you accidently leaked urine? Y  Comment Followed by PCP  Do you have problems with loss of bowel control? N  Managing your Medications? N  Managing your Finances? N  Housekeeping or managing your Housekeeping? N    Patient Care Team: Shirline Frees, NP as PCP - General (Family Medicine) Jake Bathe, MD as PCP - Cardiology (Cardiology) Storm Frisk, MD (Pulmonary Disease) Storm Frisk, MD (Pulmonary Disease) Andi Devon, MD as Attending Physician (Internal Medicine) Andi Devon, MD as Attending Physician (Internal Medicine) Dorothyann Peng, MD (Internal Medicine) Crist Fat, MD as Attending Physician (Urology)  Indicate any recent Medical Services you may have received from other than [facility] providers in the past year (date may be approximate).     Assessment:   This is a routine wellness examination for [place].  Hearing/Vision screen Hearing Screening - Comments:: Wears hearing aids Vision Screening - Comments:: - up to date with routine eye exams with  Deferred   Goals Addressed               This Visit's Progress     Stay Active (pt-stated)         Depression Screen    07/10/2023    1:17 PM 08/15/2022   10:13 AM 07/05/2022   10:35 AM 06/26/2021    8:22 AM 06/20/2020    1:36 PM 03/25/2018    3:34 PM 03/15/2016    2:08 PM  PHQ 2/9 Scores  PHQ - 2 Score 0 0 0 0 0 0 0  PHQ- 9 Score  0   0      Fall Risk    07/10/2023    1:20 PM 08/15/2022   10:13 AM 07/05/2022   10:38 AM 06/26/2021    8:21 AM 06/20/2020    1:34 PM  Fall Risk   Falls in the past year? 0 0 0 0 0  Number falls in past yr: 0 0 0  0  Injury with Fall? 0 0 0  0  Risk for fall due to : No Fall Risks No Fall Risks No Fall Risks Impaired balance/gait;Impaired mobility;Medication side effect Impaired balance/gait  Follow up Falls prevention  discussed Falls evaluation completed Falls prevention discussed Falls evaluation completed;Education provided;Falls prevention discussed Falls evaluation completed;Falls prevention discussed    MEDICARE RISK AT HOME: Medicare Risk at Home Any stairs in or around the home?: Yes If so, are there any without handrails?: No Home free of loose throw rugs in walkways, pet beds, electrical cords, etc?: Yes Adequate lighting in your home to reduce risk of falls?: Yes Life alert?: No Use of a cane, walker or w/c?: Yes Grab bars in the bathroom?: Yes Shower chair or bench in shower?: Yes Elevated toilet seat or a handicapped toilet?: Yes  TIMED UP AND GO:  Was the test performed?  Yes  Length of time to ambulate 10 feet: 10 sec Gait slow and steady with assistive device    Cognitive Function:        07/10/2023    1:21 PM 07/05/2022   10:38 AM 06/26/2021    8:24 AM  6CIT Screen  What Year? -- 0 points 0 points  What month? -- 0 points 0 points  What time? -- 0 points 0 points  Count back from 20 -- 0 points 0 points  Months in reverse -- 2 points 2 points  Repeat phrase -- 0 points 2 points  Total Score  2 points 4 points    Immunizations Immunization History  Administered Date(s) Administered   Fluad Quad(high Dose 65+) 07/09/2019, 06/20/2020, 05/23/2021   Influenza Split 03/30/2012, 06/15/2015   Influenza Whole 07/02/2011   Influenza, High Dose Seasonal PF 04/29/2016, 09/06/2017, 08/05/2018   Influenza-Unspecified 03/30/2014, 06/30/2015, 06/29/2022   MODERNA COVID-19 SARS-COV-2 PEDS BIVALENT BOOSTER 80yr-8yr 10/07/2019, 11/04/2019   PPD Test 10/26/2015, 01/03/2016   Pneumococcal Conjugate-13 03/25/2018   Pneumococcal Polysaccharide-23 02/26/2020   Respiratory Syncytial Virus Vaccine,Recomb Aduvanted(Arexvy) 07/06/2022   Td 12/27/2015   Tdap 12/22/2012   Zoster Recombinant(Shingrix) 07/06/2022    TDAP status: Up to date  Flu Vaccine status: Due, Education has been  provided regarding the importance of this vaccine. Advised may receive this vaccine at local pharmacy or Health Dept. Aware to provide a copy of the vaccination record if obtained from local pharmacy or Health Dept. Verbalized acceptance and understanding.  Pneumococcal vaccine status: Up to date  Covid-19 vaccine status: Declined, Education has been provided regarding the importance of this vaccine but patient still declined. Advised may receive this vaccine at local pharmacy or Health Dept.or vaccine clinic. Aware to provide a copy of the vaccination record if obtained from local pharmacy or Health Dept. Verbalized acceptance and understanding.  Qualifies for Shingles Vaccine? Yes   Zostavax completed Yes   Shingrix Completed?: Yes  Screening Tests Health Maintenance  Topic Date Due   COVID-19 Vaccine (3 - Moderna risk series) 12/02/2019   Zoster Vaccines- Shingrix (2 of 2) 08/31/2022   INFLUENZA VACCINE  10/28/2023 (Originally 02/28/2023)   Medicare Annual Wellness (AWV)  07/09/2024   DTaP/Tdap/Td (3 - Td or Tdap) 12/26/2025   Pneumonia Vaccine 66+ Years old  Completed   Hepatitis C Screening  Completed   HPV VACCINES  Aged Out   Colonoscopy  Discontinued    Health Maintenance  Health Maintenance Due  Topic Date Due   COVID-19 Vaccine (3 - Moderna risk series) 12/02/2019   Zoster Vaccines- Shingrix (2 of 2) 08/31/2022        Additional Screening:  Hepatitis C Screening: does qualify; Completed 03/15/17  Vision Screening: Recommended annual ophthalmology exams for early detection of glaucoma and other disorders of the eye. Is the patient up to date with their annual eye exam?  Yes  Who is the provider or what is the name of the office in which the patient attends annual eye exams? Deferred If pt is not established with a provider, would they like to be referred to a provider to establish care? No .   Dental Screening: Recommended annual dental exams for proper oral  hygiene    Community Resource Referral / Chronic Care Management:  CRR required this visit?  No   CCM required this visit?  No     Plan:     I have personally reviewed and noted the following in the patient's chart:   Medical and social history Use of alcohol, tobacco or illicit drugs  Current medications and supplements including opioid prescriptions. Patient is currently taking opioid prescriptions. Information provided to patient regarding non-opioid alternatives. Patient advised to discuss non-opioid treatment plan with their provider. Functional ability and status Nutritional status Physical activity Advanced directives List of other physicians Hospitalizations, surgeries, and ER visits in previous 12 months Vitals Screenings to include cognitive, depression, and falls Referrals and appointments  In addition, I have reviewed and discussed with patient certain preventive protocols, quality metrics, and best practice recommendations. A written personalized care plan for  preventive services as well as general preventive health recommendations were provided to patient.     Tillie Rung, LPN   [id number]   After Visit Summary: Given  Nurse Notes: None

## 2023-07-10 NOTE — Telephone Encounter (Signed)
LMTCB x 1 

## 2023-07-10 NOTE — Patient Instructions (Addendum)
Jeffery Copeland , Thank you for taking time to come for your Medicare Wellness Visit. I appreciate your ongoing commitment to your health goals. Please review the following plan we discussed and let me know if I can assist you in the future.   Referrals/Orders/Follow-Ups/Clinician Recommendations:   This is a list of the screening recommended for you and due dates:  Health Maintenance  Topic Date Due   COVID-19 Vaccine (3 - Moderna risk series) 12/02/2019   Zoster (Shingles) Vaccine (2 of 2) 08/31/2022   Flu Shot  10/28/2023*   Medicare Annual Wellness Visit  07/09/2024   DTaP/Tdap/Td vaccine (3 - Td or Tdap) 12/26/2025   Pneumonia Vaccine  Completed   Hepatitis C Screening  Completed   HPV Vaccine  Aged Out   Colon Cancer Screening  Discontinued  *Topic was postponed. The date shown is not the original due date.  Opioid Pain Medicine Management Opioid pain medicines are strong medicines that are used to treat bad or very bad pain. When you take them for a short time, they can help you: Sleep better. Do better in physical therapy. Feel better during the first few days after you get hurt. Recover from surgery. Only take these medicines if a doctor says that you can. You should only take them for a short time. This is because opioids can be very addictive. This means that they are hard to stop taking. The longer you take opioids, the harder it may be to stop taking them. What are the risks? Opioids can cause problems (side effects). Taking them for more than 3 days raises your chance of problems, such as: Trouble pooping (constipation). Feeling sick to your stomach (nausea). Vomiting. Feeling very sleepy. Confusion. Not being able to stop taking the medicine. Breathing problems. Taking opioids for a long time can make it hard for you to do daily tasks. It can also put you at risk for: Car accidents. Depression. Suicide. Heart attack. Taking too much of the medicine (overdose). This can  lead to death. What is a pain treatment plan? A pain treatment plan is a plan made by you and your doctor. Work with your doctor to make a plan for treating your pain. To help you do this: Talk about the goals of your treatment, including: How much pain you might expect to have. How you will manage the pain. Talk about the risks and benefits of taking these medicines for your condition. Remember that a good treatment plan uses more than one approach and lowers the risks of side effects. Tell your doctor about the amount of medicines you take and about any drug or alcohol use. Get your pain medicine prescriptions from only one doctor. Pain can be managed with other treatments. Work with your doctor to find other ways to help your pain, such as: Physical therapy or doing gentle exercises. Counseling. Eating healthy foods. Massage. Meditation. Other pain medicines. How to use opioid pain medicine safely Taking medicine Take your pain medicine exactly as told by your doctor. Take it only when you need it. If your pain is not too bad, you may take less medicine if your doctor allows. If you have no pain, do not take the medicine unless your doctor tells you to take it. If your pain is very bad, do not take more medicine than your doctor told you to take. Call your doctor to know what to do. Write down the times when you take your pain medicine. Look at the times before  you take your next dose. Take other over-the-counter or prescription medicines only as told by your doctor. Keeping yourself and others safe  While you are taking opioids: Do not drive, use machines, or power tools. Do not sign important papers (legal documents). Do not drink alcohol. Do not take sleeping pills. Do not take care of children by yourself. Do not do activities where you need to climb or be in high places, like working on a ladder. Do not go to a lake, river, ocean, swimming pool, or hot tub. Keep your  opioids locked up or in a place where children cannot reach them. Do not share your pain medicine with anyone. Stopping your use of opioids If you have been taking opioids for more than a few weeks, you may need to slowly decrease (taper) how much you take until you stop taking them. Doing this can lower your chance of having symptoms.  Symptoms that come from suddenly stopping the use of opioids include: Pain and cramping in your belly (abdomen). Feeling sick to your stomach (nausea).z Sweating. Feeling very sleepy. Feeling restless. Shaking you cannot control (tremors). Cravings for the medicine. Do not try to stop taking them by yourself. Work with your doctor to stop. Your doctor will help you take less until you are not taking the medicine at all. Getting rid of unused pills Do not save any pills that you did not use. Get rid of the pills by: Taking them to a take-back program in your area. Bringing them to a pharmacy that receives unused pills. Flushing them down the toilet. Check the label or package insert of your medicine to see whether this is safe to do. Throwing them in the trash. Check the label or package insert of your medicine to see whether this is safe to do. If it is safe to throw them out: Take the pills out of their container. Put the pills into a container you can seal. Mix the pills with used coffee grounds, food scraps, dirt, or cat litter. Put this in the trash. Follow these instructions at home: Activity Do exercises as told by your doctor. Avoid doing things that make your pain worse. Return to your normal activities as told by your doctor. Ask your doctor what activities are safe for you. General instructions You may need to take these actions to prevent or treat constipation: Drink enough fluid to keep your pee (urine) pale yellow. Take over-the-counter or prescription medicines. Eat foods that are high in fiber. These include beans, whole grains, and  fresh fruits and vegetables. Limit foods that are high in fat and sugar. These include fried or sweet foods. Keep all follow-up visits. Where to find support If you have been taking opioids for a long time, get help from a local support group or counselor. Ask your doctor about this. Where to find more information Centers for Disease Control and Prevention (CDC): FootballExhibition.com.br [place] Food and Drug Administration (FDA): PumpkinSearch.com.ee Get help right away if: You may have taken too much of an opioid (overdosed). Common symptoms of an overdose: Your breathing is slower or more shallow than normal. You have a very slow heartbeat. Your speech is not normal. You vomit or you feel as if you may vomit. The black centers of your eyes (pupils) are smaller than normal. You have other potential symptoms: You feel very confused. You faint. You are very sleepy. You have cold skin. You have blue lips or fingernails. You have thoughts of harming yourself or  harming others. These symptoms may be an emergency. Get help right away. Call your local emergency services (911 in the [place] Do not wait to see if the symptoms will go away. Do not drive yourself to the hospital. Get help right away if you feel like you may hurt yourself or others, or have thoughts about taking your own life. Go to your nearest emergency room or: Call your local emergency services (911 in the [place] Call the [facility] at [phone]. Call a suicide crisis helpline, such as the National Suicide Prevention Lifeline at 504-281-2131 or 988 in the [place] This is open 24 hours a day. Text the Crisis Text Line at 334-102-8435. Summary Opioid are strong medicines that are used to treat bad or very bad pain. A pain treatment plan is a plan made by you and your doctor. Work with your doctor to make a plan for treating your pain. If you think that you or someone else may have taken too much of an opioid, get help right  away. This information is not intended to replace advice given to you by your health care provider. Make sure you discuss any questions you have with your health care provider. Document Revised: 02/08/2021 Document Reviewed: 10/26/2020 Elsevier Patient Education  2024 [employer]   Advanced directives: (In Chart) A copy of your advanced directives are scanned into your chart should your provider ever need it.  Next Medicare Annual Wellness Visit scheduled for next year: Yes

## 2023-07-11 ENCOUNTER — Ambulatory Visit: Payer: Medicare HMO | Attending: Cardiovascular Disease

## 2023-07-11 DIAGNOSIS — Z5181 Encounter for therapeutic drug level monitoring: Secondary | ICD-10-CM

## 2023-07-11 DIAGNOSIS — I4891 Unspecified atrial fibrillation: Secondary | ICD-10-CM

## 2023-07-11 LAB — POCT INR: INR: 2.6 (ref 2.0–3.0)

## 2023-07-11 NOTE — Telephone Encounter (Signed)
2nd message left re: refil Stiolto Per last ov pt was on [employer]. Need to confirm.   Instructions   Return in about 6 months (around 10/21/2023) for f/u Dr. Francine Graven. Start prednisone taper for your breathing 40mg  daily x 3 days 30mg  daily x 3 days 20mg  daily x 3 days 10mg  daily x 3 days   Try Breztri inhaler 2 puffs twice daily - rinse mouth out after each use   Continue to use albuterol inhaler 1-2 puffs evey 4-6 hours as needed   Follow up in 6 months

## 2023-07-11 NOTE — Patient Instructions (Signed)
Description   Take 1 tablet today and then continue taking Warfarin 1/2 tablet daily except 1 tablet on Sundays.  Stay consistent with greens (3 per week)  Recheck INR in 4 weeks (normally 5 weeks).  [facility] [phone]

## 2023-07-11 NOTE — Telephone Encounter (Signed)
Returning missed call [phone]

## 2023-07-13 MED ORDER — BREZTRI AEROSPHERE 160-9-4.8 MCG/ACT IN AERO: 2.0000 | INHALATION_SPRAY | Freq: Two times a day (BID) | RESPIRATORY_TRACT | 3 refills | Status: DC

## 2023-07-13 NOTE — Telephone Encounter (Signed)
Markus Daft has been refilled for pt.

## 2023-07-15 ENCOUNTER — Telehealth: Payer: Self-pay | Admitting: Pulmonary Disease

## 2023-07-15 DIAGNOSIS — J449 Chronic obstructive pulmonary disease, unspecified: Secondary | ICD-10-CM

## 2023-07-25 MED ORDER — STIOLTO RESPIMAT 2.5-2.5 MCG/ACT IN AERS
2.0000 | INHALATION_SPRAY | Freq: Every day | RESPIRATORY_TRACT | 11 refills | Status: DC
Start: 2023-07-25 — End: 2023-09-26

## 2023-07-25 NOTE — Telephone Encounter (Signed)
Stiolto sent to his mail in pharmacy

## 2023-07-26 NOTE — Telephone Encounter (Signed)
Lm x1 for pt.

## 2023-07-30 DIAGNOSIS — H43822 Vitreomacular adhesion, left eye: Secondary | ICD-10-CM | POA: Diagnosis not present

## 2023-07-30 DIAGNOSIS — H3122 Choroidal dystrophy (central areolar) (generalized) (peripapillary): Secondary | ICD-10-CM | POA: Diagnosis not present

## 2023-07-30 DIAGNOSIS — H353131 Nonexudative age-related macular degeneration, bilateral, early dry stage: Secondary | ICD-10-CM | POA: Diagnosis not present

## 2023-07-30 DIAGNOSIS — H43811 Vitreous degeneration, right eye: Secondary | ICD-10-CM | POA: Diagnosis not present

## 2023-07-30 DIAGNOSIS — Z961 Presence of intraocular lens: Secondary | ICD-10-CM | POA: Diagnosis not present

## 2023-08-01 NOTE — Telephone Encounter (Signed)
 LMTCB with sister Wynona Canes listed emergency contact.

## 2023-08-02 ENCOUNTER — Telehealth: Payer: Self-pay | Admitting: Pulmonary Disease

## 2023-08-02 MED ORDER — ALBUTEROL SULFATE HFA 108 (90 BASE) MCG/ACT IN AERS: 2.0000 | INHALATION_SPRAY | Freq: Four times a day (QID) | RESPIRATORY_TRACT | 6 refills | Status: DC | PRN

## 2023-08-02 NOTE — Telephone Encounter (Signed)
 ATC pt. LVM asking for return call back. Completing this per protocol of multiple call attempts.

## 2023-08-02 NOTE — Telephone Encounter (Signed)
 Left detailed message on vm advising that medication was sent to [facility] pharmacy. DPR checked. Nothing further needed.

## 2023-08-02 NOTE — Telephone Encounter (Signed)
 Christine sister states patient would like refill for Albuterol inhaler. Patient out of medication. Pharmacy is [facility] mail order. Wynona Canes states patient is hard of hearing on the phone. Christine phone number is [phone].

## 2023-08-08 ENCOUNTER — Ambulatory Visit: Payer: Medicare HMO | Attending: Cardiology | Admitting: *Deleted

## 2023-08-08 DIAGNOSIS — Z5181 Encounter for therapeutic drug level monitoring: Secondary | ICD-10-CM

## 2023-08-08 DIAGNOSIS — I4891 Unspecified atrial fibrillation: Secondary | ICD-10-CM | POA: Diagnosis not present

## 2023-08-08 LAB — POCT INR: INR: 7.8 — AB (ref 2.0–3.0)

## 2023-08-08 NOTE — Patient Instructions (Addendum)
 Description   Called and spoke with pt. Instructed to HOLD today's dose, tomorrow's dose and Saturday's dose and then continue taking Warfarin 1/2 tablet daily except 1 tablet on Sundays.  If you experience any signs or symptoms of bleeding, seek immediate medication attention.  Stay consistent with greens (3 per week)  Recheck INR in 1 week (normally 5 weeks).  [facility] [phone]

## 2023-08-09 ENCOUNTER — Telehealth: Payer: Self-pay | Admitting: Student in an Organized Health Care Education/Training Program

## 2023-08-09 LAB — PROTIME-INR
INR: 6.8 (ref 0.9–1.2)
Prothrombin Time: 66.2 s — ABNORMAL HIGH (ref 9.1–12.0)

## 2023-08-09 NOTE — Telephone Encounter (Signed)
 I was notified by [facility] that the patient has an INR of 6.8. On review of the patient's chart, it is known that his INR is elevated. He was evaluated by [facility] clinic yesterday and was instructed to hold his warfarin already.

## 2023-08-10 ENCOUNTER — Other Ambulatory Visit: Payer: Self-pay | Admitting: Adult Health

## 2023-08-10 DIAGNOSIS — Z760 Encounter for issue of repeat prescription: Secondary | ICD-10-CM

## 2023-08-12 DIAGNOSIS — Z8546 Personal history of malignant neoplasm of prostate: Secondary | ICD-10-CM | POA: Diagnosis not present

## 2023-08-12 DIAGNOSIS — C61 Malignant neoplasm of prostate: Secondary | ICD-10-CM | POA: Diagnosis not present

## 2023-08-15 ENCOUNTER — Ambulatory Visit: Payer: Medicare HMO | Attending: Internal Medicine | Admitting: *Deleted

## 2023-08-15 DIAGNOSIS — Z5181 Encounter for therapeutic drug level monitoring: Secondary | ICD-10-CM

## 2023-08-15 DIAGNOSIS — I4891 Unspecified atrial fibrillation: Secondary | ICD-10-CM

## 2023-08-15 LAB — POCT INR: INR: 2.6 (ref 2.0–3.0)

## 2023-08-15 NOTE — Patient Instructions (Signed)
Description   Continue taking Warfarin 1/2 tablet daily except 1 tablet on Sundays.  If you experience any signs or symptoms of bleeding, seek immediate medication attention.  Stay consistent with greens (3 per week)  Recheck INR in 2 weeks.  [facility] [phone]

## 2023-08-28 ENCOUNTER — Ambulatory Visit: Payer: Medicare HMO | Attending: Cardiovascular Disease

## 2023-08-28 DIAGNOSIS — I4891 Unspecified atrial fibrillation: Secondary | ICD-10-CM | POA: Diagnosis not present

## 2023-08-28 DIAGNOSIS — Z5181 Encounter for therapeutic drug level monitoring: Secondary | ICD-10-CM

## 2023-08-28 LAB — POCT INR: INR: 3.9 — AB (ref 2.0–3.0)

## 2023-08-28 NOTE — Patient Instructions (Signed)
Description   HOLD today's dose and then continue taking Warfarin 1/2 tablet daily except 1 tablet on Sundays.  If you experience any signs or symptoms of bleeding, seek immediate medication attention.  Stay consistent with greens (3 per week)  Recheck INR in 2 weeks.  [facility] [phone]

## 2023-09-03 DIAGNOSIS — Z8546 Personal history of malignant neoplasm of prostate: Secondary | ICD-10-CM | POA: Diagnosis not present

## 2023-09-03 DIAGNOSIS — N3943 Post-void dribbling: Secondary | ICD-10-CM | POA: Diagnosis not present

## 2023-09-12 ENCOUNTER — Ambulatory Visit: Payer: Medicare HMO | Attending: Cardiology

## 2023-09-12 DIAGNOSIS — Z5181 Encounter for therapeutic drug level monitoring: Secondary | ICD-10-CM

## 2023-09-12 DIAGNOSIS — I4891 Unspecified atrial fibrillation: Secondary | ICD-10-CM

## 2023-09-12 LAB — POCT INR: INR: 3.7 — AB (ref 2.0–3.0)

## 2023-09-12 NOTE — Patient Instructions (Signed)
Description   Eat greens today and START taking Warfarin 1/2 tablet daily.  If you experience any signs or symptoms of bleeding, seek immediate medication attention.  Stay consistent with greens (3 per week)  Recheck INR in 2 weeks.  [facility] [phone]

## 2023-09-18 ENCOUNTER — Other Ambulatory Visit: Payer: Self-pay | Admitting: Adult Health

## 2023-09-18 DIAGNOSIS — G8929 Other chronic pain: Secondary | ICD-10-CM

## 2023-09-19 NOTE — Telephone Encounter (Signed)
 Okay for refill?

## 2023-09-26 ENCOUNTER — Encounter: Payer: Self-pay | Admitting: Pulmonary Disease

## 2023-09-26 ENCOUNTER — Ambulatory Visit (INDEPENDENT_AMBULATORY_CARE_PROVIDER_SITE_OTHER): Payer: Medicare HMO | Admitting: Pulmonary Disease

## 2023-09-26 VITALS — BP 122/58 | HR 100 | Ht 64.0 in | Wt 138.4 lb

## 2023-09-26 DIAGNOSIS — J449 Chronic obstructive pulmonary disease, unspecified: Secondary | ICD-10-CM

## 2023-09-26 MED ORDER — BREZTRI AEROSPHERE 160-9-4.8 MCG/ACT IN AERO
2.0000 | INHALATION_SPRAY | Freq: Two times a day (BID) | RESPIRATORY_TRACT | 3 refills | Status: AC
Start: 2023-09-26 — End: ?

## 2023-09-26 MED ORDER — ALBUTEROL SULFATE HFA 108 (90 BASE) MCG/ACT IN AERS
2.0000 | INHALATION_SPRAY | Freq: Four times a day (QID) | RESPIRATORY_TRACT | 3 refills | Status: AC | PRN
Start: 2023-09-26 — End: ?

## 2023-09-26 NOTE — Progress Notes (Signed)
 Synopsis: Former patient of Dr. Thora Lance, hx of COPD  Subjective:   PATIENT ID: Jeffery Copeland: male DOB: 12-14-1945, MRN: [id number]  HPI  Chief Complaint  Patient presents with   Follow-up   Jeffery Copeland is a 78 year old male, former smoker with left lower lobe nodule s/p lobectomy and COPD who returns to pulmonary clinic for follow up.   He is currently using the Breztri inhaler, taking two puffs in the morning and two in the evening, despite the instructions indicating one puff per day. He previously found Breztri too expensive and switched to Stiolto, using two puffs in the morning. However, he is now back on Breztri as it was sent to him in the mail.  He experiences shortness of breath, which has improved with the use of Breztri, although it persists. He also experiences occasional wheezing, describing it as 'something gets caught right there in my throat all the time,' which he can cough up. The wheezing is present at the time of the visit but manageable.  He is also using albuterol as needed for his respiratory symptoms.  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   Recent fall 2012    Esophageal reflux    Generalized osteoarthrosis, unspecified site    History of tobacco abuse    Hypertension    HYPERTENSION, BENIGN 10/20/2008   Qualifier: Diagnosis of  By: Manson Passey, RN, BSN, Lauren     Hyponatremia    Insomnia    Iron deficiency anemia, unspecified    ISCHEMIC COLITIS, HX OF 10/20/2008   Qualifier: Diagnosis of  By: Manson Passey, RN, BSN, Lauren      Lung nodules 2008, 2014   LLL nodule removed 2008 with LLL lobectomy,  RLL nodule 3mm observation   Prostate cancer (HCC) 2015   Protruding sternal wires    S/P mitral valve replacement 02/01/1998   Carpentier-Edwards porcine bioprosthetic tissue valve, size 31mm Placed for acute and subacute bacterial endocarditis (group G Streptococcus) with pre-existing mitral valve prolapse    S/P mitral valve replacement with bioprosthetic valve 03/24/2013   Redo mitral valve replacement using 29mm Edwards Magna mitral bovine bioprosthetic tissue valve performed via right mini thoracotomy   Seizures (HCC)    last seizure 1 year ago   Severe mitral regurgitation 02/20/2013   Unsteady gait      Family History  Problem Relation Age of Onset   Cancer Father        prostate cancer   Tuberculosis Father    Heart attack Father    Cancer Brother        prostate cancer   Arthritis Mother    Lung disease Neg Hx      Social History   Socioeconomic History   Marital status: Divorced    Spouse name: Not on file   Number of children: 2   Years of education: 9   Highest education level: Not on file  Occupational History   Occupation: disabled  Tobacco Use   Smoking status: Former    Current packs/day:  0.00    Average packs/day: 2.0 packs/day for 45.0 years (90.0 ttl pk-yrs)    Types: Cigarettes    Start date: 07/30/1961    Quit date: 07/30/2006    Years since quitting: 17.1   Smokeless tobacco: Never  Vaping Use   Vaping status: Never Used  Substance and Sexual Activity   Alcohol use: No    Alcohol/week: 0.0 standard drinks of alcohol    Comment: previous alcohol use & home-made alcohol consumption   Drug use: No   Sexual activity: Not Currently  Other Topics Concern   Not on file  Social History Narrative   Patient is divorced and has 2 children.   Patient is right handed.   Patient has 9 th grade education.    Patient drinks diet sodas.      [place] [facility]:   From [place] originally.  Always lived in [place]. 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 Questionable asbestos exposure. Previously traveled to [place], [place], & [place]. No pets currently. Remote pet owl and parakeet exposure. No mold exposure. Enjoys fishing.    Social Drivers of Corporate investment banker Strain: Low Risk  (07/10/2023)   Overall Financial Resource Strain (CARDIA)    Difficulty of Paying Living Expenses: Not hard at all  Food Insecurity: No Food Insecurity (07/10/2023)   Hunger Vital Sign    Worried About Running Out of Food in the Last Year: Never true    Ran Out of Food in the Last Year: Never true  Transportation Needs: No Transportation Needs (07/10/2023)   PRAPARE - Administrator, Civil Service (Medical): No    Lack of Transportation (Non-Medical): No  Physical Activity: Inactive (07/10/2023)   Exercise Vital Sign    Days of Exercise per Week: 0 days    Minutes of Exercise per Session: 0 min  Stress: No Stress Concern Present (07/10/2023)   [employer] of Occupational Health - Occupational Stress Questionnaire    Feeling of Stress : Not at all  Social Connections: Socially Isolated (07/10/2023)   Social Connection and Isolation Panel [NHANES]    Frequency of Communication with Friends and Family: More than three times a week    Frequency of Social Gatherings with Friends and Family: More than three times a week    Attends Religious Services: Never    Database administrator or Organizations: No    Attends Banker Meetings: Never    Marital Status: Divorced  Catering manager Violence: Not At Risk (07/10/2023)   Humiliation, Afraid, Rape, and Kick questionnaire    Fear of Current or Ex-Partner: No    Emotionally Abused: No    Physically Abused: No    Sexually Abused: No     Allergies  Allergen Reactions   Ace Inhibitors Swelling   Lisinopril     Seizures    Rosuvastatin Other (See Comments)    Muscle aches     Outpatient Medications Prior to Visit   Medication Sig Dispense Refill   cholecalciferol (VITAMIN D3) 25 MCG (1000 UNIT) tablet Take 1,000 Units by mouth daily.     ezetimibe (ZETIA) 10 MG tablet TAKE 1 TABLET EVERY DAY 90 tablet 3   levETIRAcetam (KEPPRA) 500 MG tablet Take 1 tablet (500 mg total) by mouth 2 (two) times daily. 180 tablet 3   metoprolol tartrate (LOPRESSOR) 25 MG tablet TAKE 1/2 TABLET TWICE DAILY 90 tablet 3   Multiple Vitamins-Minerals (PRESERVISION AREDS 2+MULTI VIT PO) Take 1 capsule by mouth  daily.     oxyCODONE-acetaminophen (PERCOCET/ROXICET) 5-325 MG tablet Take 1 tablet by mouth every 6 (six) hours as needed for severe pain. 10 tablet 0   phenytoin (DILANTIN) 100 MG ER capsule Take 3 capsules (300 mg total) by mouth at bedtime. 270 capsule 3   tamsulosin (FLOMAX) 0.4 MG CAPS capsule Take 0.4 mg by mouth daily.     tiZANidine (ZANAFLEX) 4 MG tablet TAKE 1 TABLET EVERY 6 HOURS AS NEEDED FOR MUSCLE SPASMS 360 tablet 3   traMADol (ULTRAM) 50 MG tablet TAKE 1 TABLET EVERY 8 HOURS AS NEEDED 90 tablet 2   warfarin (COUMADIN) 6 MG tablet TAKE 1/2 TO 1 TABLET ONE TIME DAILY AS DIRECTED BY COUMADIN CLINIC 90 tablet 3   albuterol (VENTOLIN HFA) 108 (90 Base) MCG/ACT inhaler Inhale 2 puffs into the lungs every 6 (six) hours as needed for wheezing or shortness of breath. 8 g 6   azithromycin (ZITHROMAX) 250 MG tablet Take as directed 6 tablet 0   Tiotropium Bromide-Olodaterol (STIOLTO RESPIMAT) 2.5-2.5 MCG/ACT AERS Inhale 2 puffs into the lungs daily. 4 g 11   No facility-administered medications prior to visit.    Review of Systems  Constitutional:  Negative for chills, fever, malaise/fatigue and weight loss.  HENT:  Negative for congestion, sinus pain and sore throat.   Eyes: Negative.   Respiratory:  Positive for shortness of breath. Negative for cough, hemoptysis, sputum production and wheezing.   Cardiovascular:  Negative for chest pain, palpitations, orthopnea, claudication and leg swelling.  Gastrointestinal:   Negative for abdominal pain, heartburn, nausea and vomiting.  Genitourinary: Negative.   Musculoskeletal:  Negative for joint pain and myalgias.  Skin:  Negative for rash.  Neurological:  Negative for weakness.  Endo/Heme/Allergies: Negative.   Psychiatric/Behavioral: Negative.        Objective:   Vitals:   09/26/23 1400  BP: (!) 122/58  Pulse: 100  SpO2: 90%  Weight: 138 lb 6.4 oz (62.8 kg)  Height: 5\' 4"  (1.626 m)     Physical Exam Constitutional:      General: He is not in acute distress.    Appearance: Normal appearance.  Eyes:     General: No scleral icterus.    Conjunctiva/sclera: Conjunctivae normal.  Cardiovascular:     Rate and Rhythm: Normal rate and regular rhythm.  Pulmonary:     Breath sounds: No wheezing, rhonchi or rales.  Musculoskeletal:     Right lower leg: No edema.     Left lower leg: No edema.  Skin:    General: Skin is warm and dry.  Neurological:     General: No focal deficit present.     CBC    Component Value Date/Time   WBC 7.7 03/13/2023 2108   RBC 4.09 (L) 03/13/2023 2108   HGB 13.3 03/13/2023 2108   HGB 12.4 (L) 02/21/2021 0000   HCT 39.8 03/13/2023 2108   HCT 35.9 (L) 02/21/2021 0000   PLT 218 03/13/2023 2108   PLT 189 02/21/2021 0000   MCV 97.3 03/13/2023 2108   MCV 96 02/21/2021 0000   MCH 32.5 03/13/2023 2108   MCHC 33.4 03/13/2023 2108   RDW 12.1 03/13/2023 2108   RDW 11.5 (L) 02/21/2021 0000   LYMPHSABS 0.6 (L) 12/31/2022 0108   LYMPHSABS 0.6 (L) 10/22/2017 1050   MONOABS 1.0 12/31/2022 0108   EOSABS 0.0 12/31/2022 0108   EOSABS 0.1 10/22/2017 1050   BASOSABS 0.1 12/31/2022 0108   BASOSABS 0.0 10/22/2017 1050  Latest Ref Rng & Units 03/13/2023    9:08 PM 12/31/2022    1:08 AM 08/15/2022   11:05 AM  BMP  Glucose 70 - 99 mg/dL 956  213  99   BUN 8 - 23 mg/dL 6  11  9    Creatinine 0.61 - 1.24 mg/dL 0.86  5.78  4.69   Sodium 135 - 145 mmol/L 129  130  134   Potassium 3.5 - 5.1 mmol/L 5.0  3.9  3.4    Chloride 98 - 111 mmol/L 91  95  98   CO2 22 - 32 mmol/L 29  26  24    Calcium 8.9 - 10.3 mg/dL 9.2  9.1  8.8    Chest imaging: CTA Chest 03/13/23 Mediastinum/Nodes: Thoracic inlet is within normal limits. No hilar or mediastinal adenopathy is noted. The esophagus is air-filled and within normal limits.   Lungs/Pleura: Lungs are well aerated bilaterally. Diffuse emphysematous changes are seen. Findings consistent with prior left lower lobectomy are noted and stable. Loculated left basilar effusion is noted similar to that seen on prior exam. No focal infiltrate or sizable effusion parenchymal nodule is noted. Calcified pleural plaques are noted particularly on the right.  PFT:    Latest Ref Rng & Units 11/06/2016   10:25 AM  PFT Results  FVC-Pre L 2.57   FVC-Predicted Pre % 71   FVC-Post L 2.62   FVC-Predicted Post % 72   Pre FEV1/FVC % % 59   Post FEV1/FCV % % 61   FEV1-Pre L 1.52   FEV1-Predicted Pre % 58   FEV1-Post L 1.60   DLCO uncorrected ml/min/mmHg 9.07   DLCO UNC% % 35   DLCO corrected ml/min/mmHg 9.81   DLCO COR %Predicted % 38   DLVA Predicted % 64     Labs:  Path:  Echo:  Heart Catheterization:       Assessment & Plan:   COPD, group B, by GOLD 2017 classification (HCC) - Plan: Budeson-Glycopyrrol-Formoterol (BREZTRI AEROSPHERE) 160-9-4.8 MCG/ACT AERO, albuterol (VENTOLIN HFA) 108 (90 Base) MCG/ACT inhaler  Discussion: Yuepheng Schaller is a 78 year old male, former smoker with left lower lobe nodule s/p lobectomy and COPD who returns to pulmonary clinic for follow up.   Chronic Obstructive Pulmonary Disease (COPD) Improved symptoms with Breztri inhaler.  Some residual wheezing and coughing intermittently -Continue Breztri inhaler 2 puff twice daily. -Use Albuterol as needed for wheezing. -Sent refills for both inhalers to [facility].   Melody Comas, MD [facility] Office: [phone]    Current Outpatient  Medications:    Budeson-Glycopyrrol-Formoterol (BREZTRI AEROSPHERE) 160-9-4.8 MCG/ACT AERO, Inhale 2 puffs into the lungs in the morning and at bedtime., Disp: 32.1 each, Rfl: 3   cholecalciferol (VITAMIN D3) 25 MCG (1000 UNIT) tablet, Take 1,000 Units by mouth daily., Disp: , Rfl:    ezetimibe (ZETIA) 10 MG tablet, TAKE 1 TABLET EVERY DAY, Disp: 90 tablet, Rfl: 3   levETIRAcetam (KEPPRA) 500 MG tablet, Take 1 tablet (500 mg total) by mouth 2 (two) times daily., Disp: 180 tablet, Rfl: 3   metoprolol tartrate (LOPRESSOR) 25 MG tablet, TAKE 1/2 TABLET TWICE DAILY, Disp: 90 tablet, Rfl: 3   Multiple Vitamins-Minerals (PRESERVISION AREDS 2+MULTI VIT PO), Take 1 capsule by mouth daily., Disp: , Rfl:    oxyCODONE-acetaminophen (PERCOCET/ROXICET) 5-325 MG tablet, Take 1 tablet by mouth every 6 (six) hours as needed for severe pain., Disp: 10 tablet, Rfl: 0   phenytoin (DILANTIN) 100 MG ER capsule, Take  3 capsules (300 mg total) by mouth at bedtime., Disp: 270 capsule, Rfl: 3   tamsulosin (FLOMAX) 0.4 MG CAPS capsule, Take 0.4 mg by mouth daily., Disp: , Rfl:    tiZANidine (ZANAFLEX) 4 MG tablet, TAKE 1 TABLET EVERY 6 HOURS AS NEEDED FOR MUSCLE SPASMS, Disp: 360 tablet, Rfl: 3   traMADol (ULTRAM) 50 MG tablet, TAKE 1 TABLET EVERY 8 HOURS AS NEEDED, Disp: 90 tablet, Rfl: 2   warfarin (COUMADIN) 6 MG tablet, TAKE 1/2 TO 1 TABLET ONE TIME DAILY AS DIRECTED BY COUMADIN CLINIC, Disp: 90 tablet, Rfl: 3   albuterol (VENTOLIN HFA) 108 (90 Base) MCG/ACT inhaler, Inhale 2 puffs into the lungs every 6 (six) hours as needed for wheezing or shortness of breath., Disp: 51 each, Rfl: 3

## 2023-09-26 NOTE — Patient Instructions (Addendum)
 Continue Breztri inhaler 2 puffs twice daily - rinse mouth out after each use  Use albuterol inhaler 1-2 puffs every 4-6 hours as needed  Follow up in 6 months, call sooner if needed

## 2023-09-27 ENCOUNTER — Ambulatory Visit: Payer: Medicare HMO | Attending: Cardiology | Admitting: *Deleted

## 2023-09-27 DIAGNOSIS — Z5181 Encounter for therapeutic drug level monitoring: Secondary | ICD-10-CM | POA: Diagnosis not present

## 2023-09-27 DIAGNOSIS — I4891 Unspecified atrial fibrillation: Secondary | ICD-10-CM | POA: Diagnosis not present

## 2023-09-27 LAB — POCT INR: INR: 2.4 (ref 2.0–3.0)

## 2023-09-27 NOTE — Patient Instructions (Signed)
 Description   Today take 1 tablet of warfarin then continue taking Warfarin 1/2 tablet daily. Stay consistent with greens (3 per week)  Recheck INR in 3 weeks.  [facility]in Clinic [phone]

## 2023-10-18 ENCOUNTER — Ambulatory Visit: Payer: Medicare HMO | Attending: Cardiology

## 2023-10-18 DIAGNOSIS — Z5181 Encounter for therapeutic drug level monitoring: Secondary | ICD-10-CM | POA: Diagnosis not present

## 2023-10-18 DIAGNOSIS — I4891 Unspecified atrial fibrillation: Secondary | ICD-10-CM | POA: Diagnosis not present

## 2023-10-18 LAB — POCT INR: INR: 2.6 (ref 2.0–3.0)

## 2023-10-18 NOTE — Patient Instructions (Signed)
 continue taking Warfarin 1/2 tablet daily. Stay consistent with greens (3 per week)  Recheck INR in 5 weeks.  [facility]in Clinic [phone]

## 2023-11-13 ENCOUNTER — Other Ambulatory Visit: Payer: Self-pay | Admitting: Adult Health

## 2023-11-13 DIAGNOSIS — M545 Low back pain, unspecified: Secondary | ICD-10-CM

## 2023-11-19 NOTE — Telephone Encounter (Signed)
 Okay for refill?

## 2023-11-20 ENCOUNTER — Ambulatory Visit: Attending: Cardiovascular Disease

## 2023-11-25 ENCOUNTER — Other Ambulatory Visit: Payer: Self-pay | Admitting: Adult Health

## 2023-11-25 DIAGNOSIS — Z760 Encounter for issue of repeat prescription: Secondary | ICD-10-CM

## 2023-12-04 ENCOUNTER — Telehealth: Payer: Self-pay | Admitting: *Deleted

## 2023-12-04 NOTE — Telephone Encounter (Signed)
 Spoke with pt's son regarding an appt. Pt has been sick and has missed last scheduled appt. Son states he needs a Friday appt and was able to schedule for 12/13/23 at 1115am. Advised that we are at a new location which is [address] on the fifth floor. He confirmed the location and appointment time.

## 2023-12-13 ENCOUNTER — Ambulatory Visit

## 2023-12-25 NOTE — Telephone Encounter (Signed)
 Erroneous encounter

## 2023-12-31 ENCOUNTER — Ambulatory Visit: Attending: Cardiology

## 2024-01-14 ENCOUNTER — Ambulatory Visit: Attending: Cardiology | Admitting: *Deleted

## 2024-01-14 DIAGNOSIS — Z5181 Encounter for therapeutic drug level monitoring: Secondary | ICD-10-CM

## 2024-01-14 DIAGNOSIS — I4891 Unspecified atrial fibrillation: Secondary | ICD-10-CM

## 2024-01-14 LAB — POCT INR: INR: 4.9 — AB (ref 2.0–3.0)

## 2024-01-14 NOTE — Patient Instructions (Signed)
 Description   Do take any warfarin today and no warfarin tomorrow then continue taking Warfarin 1/2 tablet daily. Stay consistent with greens (3 per week)  Recheck INR in 3 weeks.  Coumadin  Clinic [phone]

## 2024-02-06 ENCOUNTER — Ambulatory Visit: Attending: Cardiology

## 2024-02-06 DIAGNOSIS — I4891 Unspecified atrial fibrillation: Secondary | ICD-10-CM | POA: Diagnosis not present

## 2024-02-06 DIAGNOSIS — Z5181 Encounter for therapeutic drug level monitoring: Secondary | ICD-10-CM | POA: Diagnosis not present

## 2024-02-06 LAB — POCT INR: INR: 1.3 — AB (ref 2.0–3.0)

## 2024-02-06 NOTE — Patient Instructions (Signed)
 Description   Take 1 tablet today and 1 tablet tomorrow and then resume taking Warfarin 1/2 tablet daily.  Stay consistent with greens (3 per week)  Recheck INR in 1 week.  Coumadin  Clinic [phone]

## 2024-02-06 NOTE — Progress Notes (Signed)
Please see anticoagulation encounter.

## 2024-02-14 ENCOUNTER — Ambulatory Visit: Attending: Cardiology

## 2024-02-14 DIAGNOSIS — Z5181 Encounter for therapeutic drug level monitoring: Secondary | ICD-10-CM | POA: Diagnosis not present

## 2024-02-14 DIAGNOSIS — I4891 Unspecified atrial fibrillation: Secondary | ICD-10-CM

## 2024-02-14 LAB — POCT INR: INR: 4.9 — AB (ref 2.0–3.0)

## 2024-02-14 NOTE — Patient Instructions (Signed)
 HOLD Tonight and Saturday then  resume taking Warfarin 1/2 tablet daily. Eat greens tonight. Stay consistent with greens (3 per week)  Recheck INR in 2 weeks.  Coumadin  Clinic [phone]

## 2024-02-14 NOTE — Progress Notes (Signed)
 INR 4.9  Please see anticoagulation encounter
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
## 2024-02-23 ENCOUNTER — Other Ambulatory Visit: Payer: Self-pay | Admitting: Cardiology

## 2024-02-23 DIAGNOSIS — I4891 Unspecified atrial fibrillation: Secondary | ICD-10-CM

## 2024-02-25 ENCOUNTER — Ambulatory Visit (INDEPENDENT_AMBULATORY_CARE_PROVIDER_SITE_OTHER): Payer: Medicare HMO | Admitting: Adult Health

## 2024-02-25 ENCOUNTER — Encounter: Payer: Self-pay | Admitting: Adult Health

## 2024-02-25 VITALS — BP 133/63 | HR 41 | Ht 64.0 in | Wt 132.4 lb

## 2024-02-25 DIAGNOSIS — Z5181 Encounter for therapeutic drug level monitoring: Secondary | ICD-10-CM | POA: Diagnosis not present

## 2024-02-25 DIAGNOSIS — R569 Unspecified convulsions: Secondary | ICD-10-CM

## 2024-02-25 DIAGNOSIS — Z8673 Personal history of transient ischemic attack (TIA), and cerebral infarction without residual deficits: Secondary | ICD-10-CM | POA: Diagnosis not present

## 2024-02-25 MED ORDER — PHENYTOIN SODIUM EXTENDED 100 MG PO CAPS: 300.0000 mg | ORAL_CAPSULE | Freq: Every day | ORAL | 3 refills | Status: AC

## 2024-02-25 MED ORDER — LEVETIRACETAM 500 MG PO TABS: 500.0000 mg | ORAL_TABLET | Freq: Two times a day (BID) | ORAL | 3 refills | Status: AC

## 2024-02-25 NOTE — Patient Instructions (Addendum)
 Your Plan:  Continue Keppra  and Dilantin  at current dosages for seizure prevention  Continue close follow-up with PCP and cardiology for secondary stroke prevention management  Please ensure you schedule a follow-up visit with pulmonology  If you want to redo papers to obtain your drivers license, we can complete our section again but paperwork will also need to be completed by your cardiologist and pulmonologist. Please let me know if you would like to have these completed again.       Follow-up in 1 year or call earlier if needed       Thank you for coming to see us  at [facility]. I hope we have been able to provide you high quality care today.  You may receive a patient satisfaction survey over the next few weeks. We would appreciate your feedback and comments so that we may continue to improve ourselves and the health of our patients.

## 2024-02-26 ENCOUNTER — Ambulatory Visit: Payer: Self-pay | Admitting: Adult Health

## 2024-02-26 LAB — COMPREHENSIVE METABOLIC PANEL WITH GFR
ALT: 14 IU/L (ref 0–44)
AST: 14 IU/L (ref 0–40)
Albumin: 3.9 g/dL (ref 3.8–4.8)
Alkaline Phosphatase: 171 IU/L — ABNORMAL HIGH (ref 44–121)
BUN/Creatinine Ratio: 16 (ref 10–24)
BUN: 15 mg/dL (ref 8–27)
Bilirubin Total: 0.5 mg/dL (ref 0.0–1.2)
CO2: 22 mmol/L (ref 20–29)
Calcium: 8.6 mg/dL (ref 8.6–10.2)
Chloride: 99 mmol/L (ref 96–106)
Creatinine, Ser: 0.94 mg/dL (ref 0.76–1.27)
Globulin, Total: 2 g/dL (ref 1.5–4.5)
Glucose: 95 mg/dL (ref 70–99)
Potassium: 4 mmol/L (ref 3.5–5.2)
Sodium: 136 mmol/L (ref 134–144)
Total Protein: 5.9 g/dL — ABNORMAL LOW (ref 6.0–8.5)
eGFR: 83 mL/min/1.73 (ref >59)

## 2024-02-26 LAB — CBC WITH DIFFERENTIAL/PLATELET
Basophils Absolute: 0 x10E3/uL (ref 0.0–0.2)
Basos: 1 %
EOS (ABSOLUTE): 0.1 x10E3/uL (ref 0.0–0.4)
Eos: 1 %
Hematocrit: 38.2 % (ref 37.5–51.0)
Hemoglobin: 12.6 g/dL — ABNORMAL LOW (ref 13.0–17.7)
Immature Grans (Abs): 0 x10E3/uL (ref 0.0–0.1)
Immature Granulocytes: 0 %
Lymphocytes Absolute: 0.8 x10E3/uL (ref 0.7–3.1)
Lymphs: 12 %
MCH: 33.1 pg — ABNORMAL HIGH (ref 26.6–33.0)
MCHC: 33 g/dL (ref 31.5–35.7)
MCV: 100 fL — ABNORMAL HIGH (ref 79–97)
Monocytes Absolute: 0.8 x10E3/uL (ref 0.1–0.9)
Monocytes: 12 %
Neutrophils Absolute: 4.8 x10E3/uL (ref 1.4–7.0)
Neutrophils: 74 %
Platelets: 176 x10E3/uL (ref 150–450)
RBC: 3.81 x10E6/uL — ABNORMAL LOW (ref 4.14–5.80)
RDW: 11.8 % (ref 11.6–15.4)
WBC: 6.5 x10E3/uL (ref 3.4–10.8)

## 2024-02-26 LAB — VITAMIN D 25 HYDROXY (VIT D DEFICIENCY, FRACTURES): Vit D, 25-Hydroxy: 24.3 ng/mL — ABNORMAL LOW (ref 30.0–100.0)

## 2024-02-26 LAB — PHENYTOIN LEVEL, TOTAL: Phenytoin (Dilantin), Serum: 6.1 ug/mL — ABNORMAL LOW (ref 10.0–20.0)

## 2024-02-27 ENCOUNTER — Ambulatory Visit: Attending: Cardiology | Admitting: *Deleted

## 2024-02-27 DIAGNOSIS — Z5181 Encounter for therapeutic drug level monitoring: Secondary | ICD-10-CM | POA: Diagnosis not present

## 2024-02-27 DIAGNOSIS — I4891 Unspecified atrial fibrillation: Secondary | ICD-10-CM

## 2024-02-27 LAB — POCT INR: POC INR: 1.3

## 2024-02-27 NOTE — Telephone Encounter (Signed)
 Called the mobile #, there was no answer LVM. Called the home number also no answer. Left a detailed message on both home and cell phone advising the results of the lab work. Instructed he should start otc vitamin d  supplement. Informed the pt to call with questions.

## 2024-02-27 NOTE — Progress Notes (Signed)
 INR 1.3, Please see anticoagulation encounter

## 2024-02-27 NOTE — Patient Instructions (Signed)
 Description   Take 1 tablet of warfarin today and tomorrow then resume taking Warfarin 1/2 tablet daily.  Stay consistent with greens (3 per week)  Recheck INR in 1 weeks.  Coumadin  Clinic [phone]

## 2024-02-27 NOTE — Telephone Encounter (Signed)
-----   Message from Harlene Bogaert sent at 02/26/2024  3:40 PM EDT ----- Please advise patient that his recent lab work overall was satisfactory compared with his baseline.  Did note vitamin D  deficiency and recommend he start over-the-counter vitamin D  [id number] units  daily. Thank you.  ----- Message ----- From: Rebecka Memos Lab Results In Sent: 02/26/2024   7:37 AM EDT To: Harlene Bogaert, NP

## 2024-03-09 ENCOUNTER — Ambulatory Visit: Attending: Cardiology

## 2024-03-09 DIAGNOSIS — Z5181 Encounter for therapeutic drug level monitoring: Secondary | ICD-10-CM

## 2024-03-09 DIAGNOSIS — I4891 Unspecified atrial fibrillation: Secondary | ICD-10-CM | POA: Diagnosis not present

## 2024-03-09 LAB — POCT INR: INR: 1.6 — AB (ref 2.0–3.0)

## 2024-03-09 NOTE — Patient Instructions (Signed)
 Take 1.5 tablets of warfarin today then resume taking Warfarin 1/2 tablet daily.  Stay consistent with greens (3 per week)  Recheck INR in 2 weeks.  Coumadin  Clinic [phone]

## 2024-03-09 NOTE — Progress Notes (Signed)
 INR 1.6; Please see anticoagulation encounter

## 2024-03-24 ENCOUNTER — Ambulatory Visit: Attending: Cardiology | Admitting: *Deleted

## 2024-03-24 DIAGNOSIS — Z5181 Encounter for therapeutic drug level monitoring: Secondary | ICD-10-CM

## 2024-03-24 DIAGNOSIS — I4891 Unspecified atrial fibrillation: Secondary | ICD-10-CM | POA: Diagnosis not present

## 2024-03-24 LAB — POCT INR: INR: 1.5 — AB (ref 2.0–3.0)

## 2024-03-24 NOTE — Patient Instructions (Addendum)
 Description   INR-1.5; Today and tomorrow take 1 tablet (6mg ) then START taking Warfarin 1/2 tablet daily except 1 tablet on Sundays.  Stay consistent with greens (3 per week)  Recheck INR in 2 weeks per patient request.  Coumadin  Clinic [phone]

## 2024-03-24 NOTE — Progress Notes (Signed)
 Description   INR-1.5; Today and tomorrow take 1 tablet (6mg ) then START taking Warfarin 1/2 tablet daily except 1 tablet on Sundays.  Stay consistent with greens (3 per week)  Recheck INR in 2 weeks per patient request.  Coumadin  Clinic [phone]   Pt declines returning sooner than 2 weeks. He was advised of clot, stroke, bleeding risks at this visit and he verbalized understanding.

## 2024-04-07 ENCOUNTER — Telehealth: Payer: Self-pay | Admitting: *Deleted

## 2024-04-07 ENCOUNTER — Other Ambulatory Visit: Payer: Self-pay | Admitting: Adult Health

## 2024-04-07 ENCOUNTER — Ambulatory Visit

## 2024-04-07 DIAGNOSIS — M545 Low back pain, unspecified: Secondary | ICD-10-CM

## 2024-04-07 NOTE — Telephone Encounter (Signed)
 Patient called to report that the [employer] bus drove by him and so he has missed his ride and he needed to cancel the appointment. He states he will have to come next week, rescheduled to next available on Friday 04/17/24.

## 2024-04-07 NOTE — Telephone Encounter (Signed)
 Okay for refill?

## 2024-04-17 ENCOUNTER — Ambulatory Visit: Attending: Cardiology

## 2024-04-17 DIAGNOSIS — I4891 Unspecified atrial fibrillation: Secondary | ICD-10-CM | POA: Diagnosis not present

## 2024-04-17 DIAGNOSIS — Z5181 Encounter for therapeutic drug level monitoring: Secondary | ICD-10-CM | POA: Diagnosis not present

## 2024-04-17 LAB — POCT INR: INR: 2.6 (ref 2.0–3.0)

## 2024-04-17 NOTE — Progress Notes (Signed)
 INR 2.6 Please see anticoagulation encounter Continue taking Warfarin 1/2 tablet daily except 1 tablet on Sundays.  Stay consistent with greens (3 per week)  Recheck INR in 4 weeks per patient request.  Coumadin  Clinic [phone]

## 2024-04-17 NOTE — Patient Instructions (Signed)
 Continue taking Warfarin 1/2 tablet daily except 1 tablet on Sundays.  Stay consistent with greens (3 per week)  Recheck INR in 4 weeks per patient request.  Coumadin  Clinic [phone]

## 2024-05-02 ENCOUNTER — Emergency Department (HOSPITAL_COMMUNITY)

## 2024-05-02 ENCOUNTER — Inpatient Hospital Stay (HOSPITAL_COMMUNITY)
Admission: EM | Admit: 2024-05-02 | Discharge: 2024-05-06 | DRG: 480 | Disposition: A | Discharge status: Skilled Nursing Facility | Attending: Internal Medicine | Admitting: Internal Medicine

## 2024-05-02 ENCOUNTER — Other Ambulatory Visit: Payer: Self-pay

## 2024-05-02 ENCOUNTER — Encounter (HOSPITAL_COMMUNITY): Payer: Self-pay | Admitting: Internal Medicine

## 2024-05-02 DIAGNOSIS — Z888 Allergy status to other drugs, medicaments and biological substances status: Secondary | ICD-10-CM | POA: Diagnosis not present

## 2024-05-02 DIAGNOSIS — R791 Abnormal coagulation profile: Secondary | ICD-10-CM | POA: Diagnosis present

## 2024-05-02 DIAGNOSIS — R41841 Cognitive communication deficit: Secondary | ICD-10-CM | POA: Diagnosis not present

## 2024-05-02 DIAGNOSIS — M159 Polyosteoarthritis, unspecified: Secondary | ICD-10-CM | POA: Diagnosis present

## 2024-05-02 DIAGNOSIS — Z66 Do not resuscitate: Secondary | ICD-10-CM | POA: Diagnosis not present

## 2024-05-02 DIAGNOSIS — W010XXA Fall on same level from slipping, tripping and stumbling without subsequent striking against object, initial encounter: Secondary | ICD-10-CM | POA: Diagnosis present

## 2024-05-02 DIAGNOSIS — S72002A Fracture of unspecified part of neck of left femur, initial encounter for closed fracture: Principal | ICD-10-CM | POA: Diagnosis present

## 2024-05-02 DIAGNOSIS — Z743 Need for continuous supervision: Secondary | ICD-10-CM | POA: Diagnosis not present

## 2024-05-02 DIAGNOSIS — R079 Chest pain, unspecified: Secondary | ICD-10-CM | POA: Diagnosis not present

## 2024-05-02 DIAGNOSIS — I480 Paroxysmal atrial fibrillation: Secondary | ICD-10-CM | POA: Diagnosis present

## 2024-05-02 DIAGNOSIS — I110 Hypertensive heart disease with heart failure: Secondary | ICD-10-CM | POA: Diagnosis not present

## 2024-05-02 DIAGNOSIS — M8580 Other specified disorders of bone density and structure, unspecified site: Secondary | ICD-10-CM | POA: Diagnosis not present

## 2024-05-02 DIAGNOSIS — I700 Atherosclerosis of aorta: Secondary | ICD-10-CM | POA: Diagnosis not present

## 2024-05-02 DIAGNOSIS — S72002D Fracture of unspecified part of neck of left femur, subsequent encounter for closed fracture with routine healing: Secondary | ICD-10-CM | POA: Diagnosis not present

## 2024-05-02 DIAGNOSIS — M25552 Pain in left hip: Secondary | ICD-10-CM | POA: Diagnosis not present

## 2024-05-02 DIAGNOSIS — Z01818 Encounter for other preprocedural examination: Secondary | ICD-10-CM | POA: Diagnosis not present

## 2024-05-02 DIAGNOSIS — D72829 Elevated white blood cell count, unspecified: Secondary | ICD-10-CM | POA: Diagnosis present

## 2024-05-02 DIAGNOSIS — J9611 Chronic respiratory failure with hypoxia: Secondary | ICD-10-CM | POA: Diagnosis not present

## 2024-05-02 DIAGNOSIS — Z8673 Personal history of transient ischemic attack (TIA), and cerebral infarction without residual deficits: Secondary | ICD-10-CM

## 2024-05-02 DIAGNOSIS — E559 Vitamin D deficiency, unspecified: Secondary | ICD-10-CM | POA: Diagnosis present

## 2024-05-02 DIAGNOSIS — Y9301 Activity, walking, marching and hiking: Secondary | ICD-10-CM | POA: Diagnosis present

## 2024-05-02 DIAGNOSIS — I10 Essential (primary) hypertension: Secondary | ICD-10-CM | POA: Diagnosis not present

## 2024-05-02 DIAGNOSIS — K219 Gastro-esophageal reflux disease without esophagitis: Secondary | ICD-10-CM | POA: Diagnosis present

## 2024-05-02 DIAGNOSIS — Z9981 Dependence on supplemental oxygen: Secondary | ICD-10-CM

## 2024-05-02 DIAGNOSIS — M6281 Muscle weakness (generalized): Secondary | ICD-10-CM | POA: Diagnosis not present

## 2024-05-02 DIAGNOSIS — Z7901 Long term (current) use of anticoagulants: Secondary | ICD-10-CM

## 2024-05-02 DIAGNOSIS — N400 Enlarged prostate without lower urinary tract symptoms: Secondary | ICD-10-CM | POA: Diagnosis present

## 2024-05-02 DIAGNOSIS — S72102A Unspecified trochanteric fracture of left femur, initial encounter for closed fracture: Secondary | ICD-10-CM | POA: Diagnosis not present

## 2024-05-02 DIAGNOSIS — Z9889 Other specified postprocedural states: Secondary | ICD-10-CM | POA: Diagnosis not present

## 2024-05-02 DIAGNOSIS — M51369 Other intervertebral disc degeneration, lumbar region without mention of lumbar back pain or lower extremity pain: Secondary | ICD-10-CM | POA: Diagnosis not present

## 2024-05-02 DIAGNOSIS — Z01810 Encounter for preprocedural cardiovascular examination: Secondary | ICD-10-CM | POA: Diagnosis not present

## 2024-05-02 DIAGNOSIS — E785 Hyperlipidemia, unspecified: Secondary | ICD-10-CM | POA: Diagnosis present

## 2024-05-02 DIAGNOSIS — S72142A Displaced intertrochanteric fracture of left femur, initial encounter for closed fracture: Secondary | ICD-10-CM | POA: Diagnosis not present

## 2024-05-02 DIAGNOSIS — Z87898 Personal history of other specified conditions: Secondary | ICD-10-CM

## 2024-05-02 DIAGNOSIS — R0602 Shortness of breath: Secondary | ICD-10-CM | POA: Diagnosis not present

## 2024-05-02 DIAGNOSIS — E43 Unspecified severe protein-calorie malnutrition: Secondary | ICD-10-CM | POA: Diagnosis present

## 2024-05-02 DIAGNOSIS — Z8546 Personal history of malignant neoplasm of prostate: Secondary | ICD-10-CM

## 2024-05-02 DIAGNOSIS — Y92009 Unspecified place in unspecified non-institutional (private) residence as the place of occurrence of the external cause: Secondary | ICD-10-CM

## 2024-05-02 DIAGNOSIS — R918 Other nonspecific abnormal finding of lung field: Secondary | ICD-10-CM | POA: Diagnosis not present

## 2024-05-02 DIAGNOSIS — Z8679 Personal history of other diseases of the circulatory system: Secondary | ICD-10-CM

## 2024-05-02 DIAGNOSIS — Z79899 Other long term (current) drug therapy: Secondary | ICD-10-CM

## 2024-05-02 DIAGNOSIS — W19XXXA Unspecified fall, initial encounter: Secondary | ICD-10-CM | POA: Diagnosis not present

## 2024-05-02 DIAGNOSIS — I429 Cardiomyopathy, unspecified: Secondary | ICD-10-CM | POA: Diagnosis present

## 2024-05-02 DIAGNOSIS — J449 Chronic obstructive pulmonary disease, unspecified: Secondary | ICD-10-CM | POA: Diagnosis present

## 2024-05-02 DIAGNOSIS — Z87891 Personal history of nicotine dependence: Secondary | ICD-10-CM | POA: Diagnosis not present

## 2024-05-02 DIAGNOSIS — R0989 Other specified symptoms and signs involving the circulatory and respiratory systems: Secondary | ICD-10-CM | POA: Diagnosis not present

## 2024-05-02 DIAGNOSIS — R569 Unspecified convulsions: Secondary | ICD-10-CM | POA: Diagnosis present

## 2024-05-02 DIAGNOSIS — Z8042 Family history of malignant neoplasm of prostate: Secondary | ICD-10-CM

## 2024-05-02 DIAGNOSIS — Z953 Presence of xenogenic heart valve: Secondary | ICD-10-CM | POA: Diagnosis not present

## 2024-05-02 DIAGNOSIS — R600 Localized edema: Secondary | ICD-10-CM | POA: Diagnosis not present

## 2024-05-02 DIAGNOSIS — E782 Mixed hyperlipidemia: Secondary | ICD-10-CM

## 2024-05-02 DIAGNOSIS — R2689 Other abnormalities of gait and mobility: Secondary | ICD-10-CM | POA: Diagnosis not present

## 2024-05-02 DIAGNOSIS — Z8709 Personal history of other diseases of the respiratory system: Secondary | ICD-10-CM

## 2024-05-02 DIAGNOSIS — Z23 Encounter for immunization: Secondary | ICD-10-CM

## 2024-05-02 DIAGNOSIS — Z741 Need for assistance with personal care: Secondary | ICD-10-CM | POA: Diagnosis not present

## 2024-05-02 DIAGNOSIS — Z8249 Family history of ischemic heart disease and other diseases of the circulatory system: Secondary | ICD-10-CM

## 2024-05-02 DIAGNOSIS — Z4789 Encounter for other orthopedic aftercare: Secondary | ICD-10-CM | POA: Diagnosis not present

## 2024-05-02 DIAGNOSIS — S72002S Fracture of unspecified part of neck of left femur, sequela: Secondary | ICD-10-CM | POA: Diagnosis not present

## 2024-05-02 DIAGNOSIS — R531 Weakness: Secondary | ICD-10-CM | POA: Diagnosis not present

## 2024-05-02 DIAGNOSIS — I5032 Chronic diastolic (congestive) heart failure: Secondary | ICD-10-CM | POA: Diagnosis not present

## 2024-05-02 DIAGNOSIS — Z7951 Long term (current) use of inhaled steroids: Secondary | ICD-10-CM

## 2024-05-02 DIAGNOSIS — Z604 Social exclusion and rejection: Secondary | ICD-10-CM | POA: Diagnosis present

## 2024-05-02 DIAGNOSIS — R2681 Unsteadiness on feet: Secondary | ICD-10-CM | POA: Diagnosis not present

## 2024-05-02 DIAGNOSIS — I4892 Unspecified atrial flutter: Secondary | ICD-10-CM | POA: Diagnosis present

## 2024-05-02 DIAGNOSIS — J984 Other disorders of lung: Secondary | ICD-10-CM | POA: Diagnosis not present

## 2024-05-02 DIAGNOSIS — I517 Cardiomegaly: Secondary | ICD-10-CM | POA: Diagnosis not present

## 2024-05-02 LAB — BASIC METABOLIC PANEL WITH GFR
Anion gap: 10 (ref 5–15)
BUN: 10 mg/dL (ref 8–23)
CO2: 24 mmol/L (ref 22–32)
Calcium: 8.5 mg/dL — ABNORMAL LOW (ref 8.9–10.3)
Chloride: 104 mmol/L (ref 98–111)
Creatinine, Ser: 1.08 mg/dL (ref 0.61–1.24)
GFR, Estimated: >60 mL/min (ref >60)
Glucose, Bld: 115 mg/dL — ABNORMAL HIGH (ref 70–99)
Potassium: 4.1 mmol/L (ref 3.5–5.1)
Sodium: 138 mmol/L (ref 135–145)

## 2024-05-02 LAB — I-STAT CHEM 8, ED
BUN: 10 mg/dL (ref 8–23)
Calcium, Ion: 1.13 mmol/L — ABNORMAL LOW (ref 1.15–1.40)
Chloride: 102 mmol/L (ref 98–111)
Creatinine, Ser: 1.1 mg/dL (ref 0.61–1.24)
Glucose, Bld: 114 mg/dL — ABNORMAL HIGH (ref 70–99)
HCT: 40 % (ref 39.0–52.0)
Hemoglobin: 13.6 g/dL (ref 13.0–17.0)
Potassium: 4 mmol/L (ref 3.5–5.1)
Sodium: 139 mmol/L (ref 135–145)
TCO2: 26 mmol/L (ref 22–32)

## 2024-05-02 LAB — CBC WITH DIFFERENTIAL/PLATELET
Abs Immature Granulocytes: 0.07 K/uL (ref 0.00–0.07)
Basophils Absolute: 0.1 K/uL (ref 0.0–0.1)
Basophils Relative: 1 %
Eosinophils Absolute: 0.5 K/uL (ref 0.0–0.5)
Eosinophils Relative: 3 %
HCT: 39.6 % (ref 39.0–52.0)
Hemoglobin: 12.9 g/dL — ABNORMAL LOW (ref 13.0–17.0)
Immature Granulocytes: 0 %
Lymphocytes Relative: 4 %
Lymphs Abs: 0.7 K/uL (ref 0.7–4.0)
MCH: 32.5 pg (ref 26.0–34.0)
MCHC: 32.6 g/dL (ref 30.0–36.0)
MCV: 99.7 fL (ref 80.0–100.0)
Monocytes Absolute: 1.3 K/uL — ABNORMAL HIGH (ref 0.1–1.0)
Monocytes Relative: 8 %
Neutro Abs: 14.6 K/uL — ABNORMAL HIGH (ref 1.7–7.7)
Neutrophils Relative %: 84 %
Platelets: 216 K/uL (ref 150–400)
RBC: 3.97 MIL/uL — ABNORMAL LOW (ref 4.22–5.81)
RDW: 13.2 % (ref 11.5–15.5)
WBC: 17.2 K/uL — ABNORMAL HIGH (ref 4.0–10.5)
nRBC: 0 % (ref 0.0–0.2)

## 2024-05-02 LAB — PROTIME-INR
INR: 3.2 — ABNORMAL HIGH (ref 0.8–1.2)
Prothrombin Time: 33.9 s — ABNORMAL HIGH (ref 11.4–15.2)

## 2024-05-02 LAB — TYPE AND SCREEN
ABO/RH(D): A POS
Antibody Screen: NEGATIVE

## 2024-05-02 LAB — I-STAT CG4 LACTIC ACID, ED: Lactic Acid, Venous: 1.2 mmol/L (ref 0.5–1.9)

## 2024-05-02 MED ORDER — LEVALBUTEROL HCL 1.25 MG/0.5ML IN NEBU
1.2500 mg | INHALATION_SOLUTION | RESPIRATORY_TRACT | Status: DC | PRN
  Filled 2024-05-02: qty 0.5

## 2024-05-02 MED ORDER — METOPROLOL TARTRATE 12.5 MG HALF TABLET
12.5000 mg | ORAL_TABLET | Freq: Two times a day (BID) | ORAL | Status: DC
  Administered 2024-05-02 – 2024-05-03 (×3): 12.5 mg via ORAL
  Filled 2024-05-02 (×3): qty 1

## 2024-05-02 MED ORDER — MORPHINE SULFATE (PF) 4 MG/ML IV SOLN
4.0000 mg | INTRAVENOUS | Status: AC | PRN
  Administered 2024-05-02 (×2): 4 mg via INTRAVENOUS
  Filled 2024-05-02 (×2): qty 1

## 2024-05-02 MED ORDER — ACETAMINOPHEN 650 MG RE SUPP: 650.0000 mg | Freq: Four times a day (QID) | RECTAL | Status: DC | PRN

## 2024-05-02 MED ORDER — FENTANYL CITRATE PF 50 MCG/ML IJ SOSY
25.0000 ug | PREFILLED_SYRINGE | INTRAMUSCULAR | Status: DC | PRN
  Administered 2024-05-02 – 2024-05-03 (×4): 25 ug via INTRAVENOUS
  Filled 2024-05-02 (×4): qty 1

## 2024-05-02 MED ORDER — ACETAMINOPHEN 325 MG PO TABS: 650.0000 mg | ORAL_TABLET | Freq: Four times a day (QID) | ORAL | Status: DC | PRN

## 2024-05-02 MED ORDER — ONDANSETRON HCL 4 MG/2ML IJ SOLN: 4.0000 mg | Freq: Four times a day (QID) | INTRAMUSCULAR | Status: DC | PRN

## 2024-05-02 MED ORDER — LEVETIRACETAM (KEPPRA) 500 MG/5 ML ADULT IV PUSH
500.0000 mg | Freq: Two times a day (BID) | INTRAVENOUS | Status: DC
  Administered 2024-05-02 – 2024-05-04 (×5): 500 mg via INTRAVENOUS
  Filled 2024-05-02 (×6): qty 5

## 2024-05-02 MED ORDER — PHENYTOIN SODIUM EXTENDED 100 MG PO CAPS
300.0000 mg | ORAL_CAPSULE | Freq: Every day | ORAL | Status: DC
  Administered 2024-05-02 – 2024-05-05 (×4): 300 mg via ORAL
  Filled 2024-05-02 (×6): qty 3

## 2024-05-02 MED ORDER — ONDANSETRON HCL 4 MG/2ML IJ SOLN
4.0000 mg | Freq: Once | INTRAMUSCULAR | Status: AC
  Administered 2024-05-02: 4 mg via INTRAVENOUS
  Filled 2024-05-02 (×2): qty 2

## 2024-05-02 MED ORDER — BUDESON-GLYCOPYRROL-FORMOTEROL 160-9-4.8 MCG/ACT IN AERO
2.0000 | INHALATION_SPRAY | Freq: Two times a day (BID) | RESPIRATORY_TRACT | Status: DC
  Administered 2024-05-03 (×2): 2 via RESPIRATORY_TRACT
  Filled 2024-05-02: qty 5.9

## 2024-05-02 MED ORDER — MELATONIN 3 MG PO TABS: 3.0000 mg | ORAL_TABLET | Freq: Every evening | ORAL | Status: DC | PRN

## 2024-05-02 MED ORDER — EZETIMIBE 10 MG PO TABS
10.0000 mg | ORAL_TABLET | Freq: Every day | ORAL | Status: DC
  Administered 2024-05-02 – 2024-05-06 (×4): 10 mg via ORAL
  Filled 2024-05-02 (×4): qty 1

## 2024-05-02 NOTE — ED Provider Notes (Signed)
 [place] EMERGENCY DEPARTMENT AT [facility] Provider Note   CSN: [id number] Arrival date & time: 05/02/24  1821     Patient presents with: Jeffery Copeland is a 78 y.o. male.  {Add pertinent medical, surgical, social history, OB history to YEP:67052}  Fall     Patient has a history of endocarditis, stroke, hypertension, severe COPD, atrial fibrillation, cardiomyopathy, seizures.  Patient states he lives at home.  He was trying to stand up using his walker and he lost his balance and fell.  Patient landed on his left hip.  Patient is chronically on oxygen  at baseline.  He is on Coumadin .  Patient was activated as a level 2 trauma although patient is currently denying that he hit his head or neck.  He is not having any headache or neck pain.  Prior to Admission medications   Medication Sig Start Date End Date Taking? Authorizing Provider  albuterol  (VENTOLIN  HFA) 108 (90 Base) MCG/ACT inhaler Inhale 2 puffs into the lungs every 6 (six) hours as needed for wheezing or shortness of breath. 09/26/23   Kara Dorn NOVAK, MD  Budeson-Glycopyrrol-Formoterol (BREZTRI  AEROSPHERE) 160-9-4.8 MCG/ACT AERO Inhale 2 puffs into the lungs in the morning and at bedtime. 09/26/23   Kara Dorn NOVAK, MD  cholecalciferol  (VITAMIN D3) 25 MCG (1000 UNIT) tablet Take 1,000 Units by mouth daily.    [provider]  ezetimibe  (ZETIA ) 10 MG tablet TAKE 1 TABLET EVERY DAY 11/26/23   Nafziger, Darleene, NP  levETIRAcetam  (KEPPRA ) 500 MG tablet Take 1 tablet (500 mg total) by mouth 2 (two) times daily. 02/25/24   Whitfield Raisin, NP  metoprolol  tartrate (LOPRESSOR ) 25 MG tablet TAKE 1/2 TABLET TWICE DAILY 07/02/23   Nafziger, Darleene, NP  Multiple Vitamins-Minerals (PRESERVISION AREDS 2+MULTI VIT PO) Take 1 capsule by mouth daily.    [provider]  phenytoin  (DILANTIN ) 100 MG ER capsule Take 3 capsules (300 mg total) by mouth at bedtime. 02/25/24   Whitfield Raisin, NP  tamsulosin  (FLOMAX )  0.4 MG CAPS capsule Take 0.4 mg by mouth daily.    [provider]  tiZANidine  (ZANAFLEX ) 4 MG tablet TAKE 1 TABLET EVERY 6 HOURS AS NEEDED FOR MUSCLE SPASMS 08/14/23   Nafziger, Darleene, NP  traMADol  (ULTRAM ) 50 MG tablet TAKE 1 TABLET EVERY 8 HOURS AS NEEDED 04/07/24   Nafziger, Darleene, NP  warfarin (COUMADIN ) 6 MG tablet TAKE 1/2 TO 1 TABLET ONE TIME DAILY AS DIRECTED BY COUMADIN  CLINIC 02/24/24   [name], MD    Allergies: Ace inhibitors, Lisinopril , and Rosuvastatin    Review of Systems  Updated Vital Signs BP (!) 144/96   Pulse 77   Resp (!) 25   SpO2 98%   Physical Exam Vitals and nursing note reviewed.  Constitutional:      Appearance: He is well-developed. He is ill-appearing.  HENT:     Head: Normocephalic and atraumatic.     Right Ear: External ear normal.     Left Ear: External ear normal.  Eyes:     General: No scleral icterus.       Right eye: No discharge.        Left eye: No discharge.     Conjunctiva/sclera: Conjunctivae normal.  Neck:     Trachea: No tracheal deviation.  Cardiovascular:     Rate and Rhythm: Normal rate.  Pulmonary:     Effort: Pulmonary effort is normal. No respiratory distress.     Breath sounds: No stridor.  Abdominal:     General: There is no distension.     Tenderness: There is no abdominal tenderness.  Musculoskeletal:        General: Tenderness present. No swelling.     Cervical back: Neck supple. No tenderness.     Thoracic back: No tenderness.     Lumbar back: No tenderness.     Left hip: Deformity, tenderness and bony tenderness present.     Comments: Left lower extremity rotated, tenderness palpation left hip  Skin:    General: Skin is warm and dry.     Findings: No rash.  Neurological:     Mental Status: He is alert. Mental status is at baseline.     Cranial Nerves: No dysarthria or facial asymmetry.     Motor: No weakness or seizure activity.     (all labs ordered are listed, but only abnormal results are  displayed) Labs Reviewed  BASIC METABOLIC PANEL WITH GFR  CBC WITH DIFFERENTIAL/PLATELET  PROTIME-INR  TYPE AND SCREEN    EKG: None  Radiology: No results found.  {Document cardiac monitor, telemetry assessment procedure when appropriate:32947} Procedures   Medications Ordered in the ED  morphine  (PF) 4 MG/ML injection 4 mg (has no administration in time range)  ondansetron  (ZOFRAN ) injection 4 mg (has no administration in time range)    Clinical Course as of 05/02/24 1839  Sat May 02, 2024  8161 Preliminary review of left hip x-ray consistent with fracture [JK]    Clinical Course User Index [JK] Randol Simmonds, MD   {Click here for ABCD2, HEART and other calculators REFRESH Note before signing:1}                              Medical Decision Making Amount and/or Complexity of Data Reviewed Labs: ordered. Radiology: ordered.  Risk Prescription drug management.   ***  {Document critical care time when appropriate  Document review of labs and clinical decision tools ie CHADS2VASC2, etc  Document your independent review of radiology images and any outside records  Document your discussion with family members, caretakers and with consultants  Document social determinants of health affecting pt's care  Document your decision making why or why not admission, treatments were needed:32947:::1}   Final diagnoses:  None    ED Discharge Orders     None

## 2024-05-02 NOTE — H&P (Signed)
 History and Physical      Jeffery Copeland FMW:[id number] DOB: 10/24/1945 DOA: 05/02/2024; DOS: 05/02/2024  PCP: Merna Huxley, NP  Patient coming from: home   I have personally briefly reviewed patient's old medical records in [facility]  Chief Complaint: left hip pain  HPI: TEE RICHESON is a 78 y.o. male with medical history significant for chronic hypoxic respiratory failure on 2 L continuous nasal cannula in the setting of COPD, paroxysmal atrial fibrillation/flutter, chronically anticoagulated on warfarin, seizures, bioprosthetic mitral valve replacement with redo in August 2014, hyperlipidemia, who is admitted to [facility] on 05/02/2024 with acute intertrochanteric fracture of the left femoral neck after ground-level mechanical fall at home after presenting from home to [facility] ED complaining of acute left hip pain.   Patient reports tripping while attempting to ambulate at home, resulting in a ground level fall during which left hip was the principal point of contact with the floor below.  Specifically, he reports he ambulates with a walker at baseline, and that he was reaching for his walker, but misjudged the distance between him and the walker, resulting in a ground-level fall, as above.   As a result of this fall, the patient reports immediate development of sharp left hip pain, with radiation into the left groin. States that this pain has been constant since onset, with exacerbation when attempting to move the left lower extremity.  As a consequence of the associated intensity of this discomfort, reports that he is unable to bear weight on the left lower extremity at this time.  This is relative to baseline functional status in which the patient lives at home, ambulates with walker at baseline.  Aside from his presenting acute left hip discomfort, he otherwise, denies any acute arthralgias or myalgias as a result of the above fall.  Denies any acute numbness or paresthesias in  bilateral lower extremities, and confirms that left hip representations a native joint.   Did not hit head as a component of this fall, and denies any associated loss of consciousness.  Denies any preceding or associated chest pain, shortness of breath, diaphoresis, palpitations, nausea, vomiting, dizziness, presyncope, or syncope.  Denies any subsequent headache, neck pain, blurry vision, or diplopia.    In the setting of a history of paroxysmal atrial fibrillation/atrial flutter, he is chronically anticoagulated on warfarin.  Not on any antiplatelet medications.  He has a history of severe mitral valve prolapse prompting bioprosthetic mitral valve replacement in July 1999, with redo of the bioprosthetic mitral valve replacement in August 2014 in the setting of development of severe mitral regurgitation as a result of bacterial endocarditis.  No history of any mechanical valve replacement.  He has a history of chronic hypoxic respiratory failure on 2 L nasal cannula at baseline in the setting of a history of COPD.  Denies any recent worsening of his baseline shortness of breath or any recent orthopnea, PND, or peripheral edema.    ED Course:  Vital signs in the ED were notable for the following: Afebrile; heart rates in the 80s to low 100s; systolic blood pressures in the 120s to 140s; respiratory rate 19-21, oxygen  saturation 96 to 100% on his baseline 2 L continuous nasal cannula.  Labs were notable for the following: BMP was notable for the following: Sodium 138, potassium 4.1, bicarbonate 24, creatinine 1.08.  Lactic acid is 1.2.  CBC notable for white blood cell count 17,200 with 84% neutrophils, hemoglobin 12.9 associated Neuraceq/Norocarp properties and nonelevated  RDW, relative to most recent prior hemoglobin data point of 12.6 on 02/25/2024, platelet count 216.  INR 3.2.  Type and screen has been completed.  Per my interpretation, EKG in ED demonstrated the following: In comparison to most  recent prior EKG performed on 03/13/2023, today's EKG is suggestive of atrial fibrillation/flutter, heart rate 98, nonspecific T wave inversion in aVF, as well as less than 1 mm ST depression in V4, which appears unchanged from his recent prior EKG, will demonstrating no evidence of interval ST changes, including no evidence of ST elevation.  Imaging in the ED, per corresponding formal radiology read, was notable for the following: 1 view chest x-ray shows low lung volumes with bilateral perihilar interstitial opacities favoring bronchovascular crowding due to low lung volumes, demonstrating no evidence of infiltrate, pulmonary edema, pleural effusion, or pneumothorax.  Plain films of the left hip showed comminuted, displaced intertrochanteric fracture of the left femoral neck.  EDP has contacted on-call orthopedic surgeon, Dr. Genelle with [facility], who conveys that Ortho care will formally consult see the patient in the morning, without specific additional requests regarding patient's presenting INR.  While in the ED, the following were administered: Zofran  4 mg IV x 1 dose, morphine  4 mg IV x 1 dose.  Subsequently, the patient was admitted for further evaluation management of acute intertrochanteric fracture of the left femoral neck after aggressive mechanical fall at home, with presenting labs also notable for mild leukocytosis.    Review of Systems: As per HPI otherwise 10 point review of systems negative.   Past Medical History:  Diagnosis Date   Acute and subacute bacterial endocarditis 1999   group G Streptococcus   Atrial fibrillation (HCC) 11/11/2008   Qualifier: Diagnosis of  By: Morris, MD, CODY Debby Lenis    CARDIOMYOPATHY 10/20/2008   Qualifier: Diagnosis of  By: Delores, RN, BSN, Lauren     Cerebrovascular disease, unspecified    Closed right acetabular fracture (HCC)    COPD (chronic obstructive pulmonary disease) (HCC) 1999   Epidural hematoma (HCC) 03/01/2011   Recent fall  2012    Esophageal reflux    Generalized osteoarthrosis, unspecified site    History of tobacco abuse    Hypertension    HYPERTENSION, BENIGN 10/20/2008   Qualifier: Diagnosis of  By: Delores, RN, BSN, Lauren     Hyponatremia    Insomnia    Iron deficiency anemia, unspecified    ISCHEMIC COLITIS, HX OF 10/20/2008   Qualifier: Diagnosis of  By: Delores, RN, BSN, Lauren     Lung nodules 2008, 2014   LLL nodule removed 2008 with LLL lobectomy,  RLL nodule 3mm observation   Prostate cancer (HCC) 2015   Protruding sternal wires    S/P mitral valve replacement 02/01/1998   Carpentier-Edwards porcine bioprosthetic tissue valve, size 31mm Placed for acute and subacute bacterial endocarditis (group G Streptococcus) with pre-existing mitral valve prolapse    S/P mitral valve replacement with bioprosthetic valve 03/24/2013   Redo mitral valve replacement using 29mm [facility] mitral bovine bioprosthetic tissue valve performed via right mini thoracotomy   Seizures (HCC)    last seizure 1 year ago   Severe mitral regurgitation 02/20/2013   Unsteady gait     Past Surgical History:  Procedure Laterality Date   CARDIAC VALVE REPLACEMENT     INTRAOPERATIVE TRANSESOPHAGEAL ECHOCARDIOGRAM N/A 03/24/2013   Procedure: INTRAOPERATIVE TRANSESOPHAGEAL ECHOCARDIOGRAM;  Surgeon: Sudie VEAR Laine, MD;  Location: [facility] OR;  Service: Open Heart Surgery;  Laterality: N/A;  MINIMALLY INVASIVE MAZE PROCEDURE N/A 03/24/2013   Procedure: MINIMALLY INVASIVE MAZE PROCEDURE;  Surgeon: Sudie VEAR Laine, MD;  Location: [facility] OR;  Service: Open Heart Surgery;  Laterality: N/A;   MITRAL VALVE REPLACEMENT  02/01/1998   Carpentier-Edwards porcine bioprosthetic tissue valve, size 31mm, placed for complicated bacterial endocarditis   MITRAL VALVE REPLACEMENT Right 03/24/2013   Procedure: MINIMALLY INVASIVE REDO MITRAL VALVE (MV) REPLACEMENT;  Surgeon: Sudie VEAR Laine, MD;  Location: [facility] OR;  Service: Open Heart Surgery;  Laterality: Right;    PROSTATE BIOPSY     radiation treatment tx for prostate cancer  2015   STERNAL WIRES REMOVAL N/A 07/10/2018   Procedure: STERNAL WIRES REMOVAL;  Surgeon: Laine Sudie VEAR, MD;  Location: [facility] OR;  Service: Thoracic;  Laterality: N/A;   TEE WITHOUT CARDIOVERSION N/A 02/12/2013   Procedure: TRANSESOPHAGEAL ECHOCARDIOGRAM (TEE);  Surgeon: Vina LULLA Gull, MD;  Location: [facility] ENDOSCOPY;  Service: Cardiovascular;  Laterality: N/A;   VIDEO ASSISTED THORACOSCOPY  04/29/2007   Left VATS w/ mini thoracotomy for Left Lower Lobectomy for benign lung nodules    Social History:  reports that he quit smoking about 17 years ago. His smoking use included cigarettes. He started smoking about 62 years ago. He has a 90 pack-year smoking history. He has never used smokeless tobacco. He reports that he does not drink alcohol and does not use drugs.   Allergies  Allergen Reactions   Ace Inhibitors Swelling   Lisinopril      Seizures    Rosuvastatin Other (See Comments)    Muscle aches    Family History  Problem Relation Age of Onset   Cancer Father        prostate cancer   Tuberculosis Father    Heart attack Father    Cancer Brother        prostate cancer   Arthritis Mother    Lung disease Neg Hx     Family history reviewed and not pertinent    Prior to Admission medications   Medication Sig Start Date End Date Taking? Authorizing Provider  albuterol  (VENTOLIN  HFA) 108 (90 Base) MCG/ACT inhaler Inhale 2 puffs into the lungs every 6 (six) hours as needed for wheezing or shortness of breath. 09/26/23   Kara Dorn NOVAK, MD  Budeson-Glycopyrrol-Formoterol (BREZTRI  AEROSPHERE) 160-9-4.8 MCG/ACT AERO Inhale 2 puffs into the lungs in the morning and at bedtime. 09/26/23   Kara Dorn NOVAK, MD  cholecalciferol  (VITAMIN D3) 25 MCG (1000 UNIT) tablet Take 1,000 Units by mouth daily.    [provider]  ezetimibe  (ZETIA ) 10 MG tablet TAKE 1 TABLET EVERY DAY 11/26/23   Nafziger, Darleene, NP   levETIRAcetam  (KEPPRA ) 500 MG tablet Take 1 tablet (500 mg total) by mouth 2 (two) times daily. 02/25/24   Whitfield Raisin, NP  metoprolol  tartrate (LOPRESSOR ) 25 MG tablet TAKE 1/2 TABLET TWICE DAILY 07/02/23   Nafziger, Darleene, NP  Multiple Vitamins-Minerals (PRESERVISION AREDS 2+MULTI VIT PO) Take 1 capsule by mouth daily.    [provider]  phenytoin  (DILANTIN ) 100 MG ER capsule Take 3 capsules (300 mg total) by mouth at bedtime. 02/25/24   Whitfield Raisin, NP  tamsulosin  (FLOMAX ) 0.4 MG CAPS capsule Take 0.4 mg by mouth daily.    [provider]  tiZANidine  (ZANAFLEX ) 4 MG tablet TAKE 1 TABLET EVERY 6 HOURS AS NEEDED FOR MUSCLE SPASMS 08/14/23   Nafziger, Darleene, NP  traMADol  (ULTRAM ) 50 MG tablet TAKE 1 TABLET EVERY 8 HOURS AS NEEDED 04/07/24   Nafziger, Darleene,  NP  warfarin (COUMADIN ) 6 MG tablet TAKE 1/2 TO 1 TABLET ONE TIME DAILY AS DIRECTED BY COUMADIN  CLINIC 02/24/24   [name], MD     Objective    Physical Exam: Vitals:   05/02/24 1832 05/02/24 1844 05/02/24 1844 05/02/24 1846  BP:    (!) 144/96  Pulse: 77 92  92  Resp: (!) 25 19  19   Temp:   98.7 F (37.1 C)   TempSrc:   Oral   SpO2: 98% 100%  100%    General: appears to be stated age; alert, oriented Skin: warm, dry, no rash Head:  AT/[place] Mouth:  Oral mucosa membranes appear dry, normal dentition Neck: supple; trachea midline Heart: Irregular, but rate controlled; did not appreciate any M/R/G Lungs: CTAB, did not appreciate any wheezes, rales, or rhonchi Abdomen: + BS; soft, ND, NT Vascular: 2+ pedal pulses b/l; 2+ radial pulses b/l Extremities: Externally rotated left lower extremity ;no peripheral edema, no muscle wasting   Labs on Admission: I have personally reviewed following labs and imaging studies  CBC: Recent Labs  Lab 05/02/24 1832 05/02/24 1905  WBC 17.2*  --   NEUTROABS 14.6*  --   HGB 12.9* 13.6  HCT 39.6 40.0  MCV 99.7  --   PLT 216  --    Basic Metabolic Panel: Recent Labs   Lab 05/02/24 1832 05/02/24 1905  NA 138 139  K 4.1 4.0  CL 104 102  CO2 24  --   GLUCOSE 115* 114*  BUN 10 10  CREATININE 1.08 1.10  CALCIUM  8.5*  --    GFR: CrCl cannot be calculated (Unknown ideal weight.). Liver Function Tests: No results for input(s): AST, ALT, ALKPHOS, BILITOT, PROT, ALBUMIN  in the last 168 hours. No results for input(s): LIPASE, AMYLASE in the last 168 hours. No results for input(s): AMMONIA in the last 168 hours. Coagulation Profile: Recent Labs  Lab 05/02/24 1832  INR 3.2*   Cardiac Enzymes: No results for input(s): CKTOTAL, CKMB, CKMBINDEX, TROPONINI in the last 168 hours. BNP (last 3 results) No results for input(s): PROBNP in the last 8760 hours. HbA1C: No results for input(s): HGBA1C in the last 72 hours. CBG: No results for input(s): GLUCAP in the last 168 hours. Lipid Profile: No results for input(s): CHOL, HDL, LDLCALC, TRIG, CHOLHDL, LDLDIRECT in the last 72 hours. Thyroid  Function Tests: No results for input(s): TSH, T4TOTAL, FREET4, T3FREE, THYROIDAB in the last 72 hours. Anemia Panel: No results for input(s): VITAMINB12, FOLATE, FERRITIN, TIBC, IRON, RETICCTPCT in the last 72 hours. Urine analysis:    Component Value Date/Time   COLORURINE YELLOW 03/20/2013 1530   APPEARANCEUR CLEAR 03/20/2013 1530   LABSPEC 1.004 (L) 03/20/2013 1530   PHURINE 5.5 03/20/2013 1530   GLUCOSEU NEGATIVE 03/20/2013 1530   HGBUR NEGATIVE 03/20/2013 1530   BILIRUBINUR NEGATIVE 03/20/2013 1530   KETONESUR NEGATIVE 03/20/2013 1530   PROTEINUR NEGATIVE 03/20/2013 1530   UROBILINOGEN 0.2 03/20/2013 1530   NITRITE NEGATIVE 03/20/2013 1530   LEUKOCYTESUR NEGATIVE 03/20/2013 1530    Radiological Exams on Admission: DG Hip Unilat With Pelvis 2-3 Views Left Result Date: 05/02/2024 CLINICAL DATA:  fall, pain in hip EXAM: DG HIP (WITH OR WITHOUT PELVIS) 2-3V LEFT COMPARISON:  None Available.  FINDINGS: Osteopenia. No pelvic fracture or pelvic bone diastasis.Impacted, comminuted and displaced intertrochanteric fracture of the left femoral neck. The femoral shaft is medially displaced 2.1 cm.No hip dislocation.Degenerative disc disease of the visualized lumbar spine. Aortoiliac and peripheral vascular atherosclerosis. IMPRESSION:  Comminuted, displaced intertrochanteric fracture of the left femoral neck. Electronically Signed   By: Rogelia Myers M.D.   On: 05/02/2024 19:08   DG Chest Port 1 View Result Date: 05/02/2024 CLINICAL DATA:  fall, pain in hip EXAM: PORTABLE CHEST - 1 VIEW COMPARISON:  03/13/2023 FINDINGS: Low lung volumes. Diffuse bilateral perihilar interstitial opacities. Similar chronic blunting and irregular contour of the left costophrenic sulcus and hemidiaphragm. No focal airspace consolidation, pleural effusion, or pneumothorax. Moderate cardiomegaly. Cardiac valve replacement. Sternotomy wires. Tortuous aorta with aortic atherosclerosis. No acute fracture or destructive lesions. Multilevel thoracic osteophytosis. IMPRESSION: Low lung volumes. Bilateral perihilar interstitial opacities, which may represent bronchovascular crowding due to low lung volumes, interstitial edema, or atypical/viral infection, in the correct clinical context. Electronically Signed   By: Rogelia Myers M.D.   On: 05/02/2024 19:06      Assessment/Plan   Principal Problem:   Closed left hip fracture [facility]) Active Problems:   Acute hip pain, left   Fall at home, initial encounter   Leukocytosis   Chronic hypoxic respiratory failure (HCC)   History of COPD   History of seizures   HLD (hyperlipidemia)     #) Acute intertrochanteric fracture of the left femoral neck femoral neck fracture: confirmed via presenting plain films and stemming from ground level mechanical fall without associated loss of consciousness that occurred earlier on the day of admission, as further described above, resulting  in immediate develop of acute left hip pain.  At this time, the left lower extremity appears neurovascularly intact, and patient reports adequate pain control.  While he is on no antiplatelet medications at home, he is chronically anticoagulated on warfarin in the setting of a history of accessible atrial fibrillation/flutter, with presenting INR noted to be 3.2.  EDP has contacted on-call orthopedic surgeon, Dr. Genelle with [facility], who conveys that Ortho care will formally consult see the patient in the morning, without specific additional requests regarding patient's presenting INR.  In the absence of indication for urgent/emergent surgical intervention, and in the absence of evidence of active bleed, we will refrain from aggressive reversal of INR at this time.  Rather, will hold warfarin for now, repeat INR in the morning, with potential for ensuing FFP depending upon ensuing associated trend.  Gupta Score for this patient in the context of anticipated aforementioned orthopedic surgery conveys a 1.5% perioperative risk for significant cardiac event. No evidence to suggest acutely decompensated heart failure or acute MI. Consequently, no absolute contraindications to proceeding with proposed orthopedic surgery at this time.  Plan: Formal orthopedic surgery consult for definitive surgical management. NPO after MN. No pharmacologic anticoagulation leading up to this anticipated surgery.  Hold home warfarin, with repeat INR ordered for the morning, as above.  SCD's. Prn IV fentanyl . Anticipate postoperative PT consult. Type and screen ordered.  Incentive spirometry.  Prn IV Zofran .                    #) Ground level mechanical fall: The patient reports a ground level mechanical fall earlier today in which he tripped while attempting to reach for his walker, without any associated loss of consciousness. Appears to be purely mechanical in nature, without clinical evidence at this time to  suggest contributory dizziness, presyncope, syncope, or acute ischemic CVA. Does not appear to have hit head as component of this fall. While this fall appears to be purely mechanical in nature, will also check urinalysis to evaluate for any underlying infectious contribution.  Plan: Check urinalysis, as above.  CMP and CBC with differential in the morning. Fall precautions. Anticipate postoperative PT consult.                     #) Leukocytosis: Presenting CBC reflects mildly elevated white cell count of 17,200. Suspect that this is reactive in nature in the setting of presenting ground-level mechanical fall as well as associated acute hip fracture.  No evidence to suggest underlying infectious process at this time, including presenting chest x-ray which showed no evidence of infiltrate to suggest bacterial pneumonia. Will check urinalysis to further evaluate for any underlying infectious contribution.  Plan: Check urinalysis, as above.  Repeat CBC with diff in the morning.                       #) Chronic hypoxic respiratory failure in the setting of COPD: Documented history thereof, in which the patient is on 2 L continuous nasal cannula at baseline ,without clinical evidence of acute exacerbation at this time, while also noting that presenting oxygen  saturations are in the high 90s to 100% on his baseline 2 L continuous nasal cannula.  Outpatient respiratory regimen includes the following: Scheduled Breztri  inhaler as well as as needed albuterol  inhaler.  This is all in the context of being a former smoker.  Will promote pulmonary toilet and optimization of respiratory status leading up to anticipated orthopedic surgery for his presenting acute intertrochanteric fracture of the left femoral neck, as below.  Plan: cont outpatient Breztri  inhaler. Prn Xopenex  nebulizer. Check CMP and serum magnesium  level in the AM.  Add-on magnesium  level as well as phosphorus  level.  Incentive spirometry.                      #) Paroxysmal atrial fibrillation/atrial flutter: Documented history of such. In setting of CHA2DS2-VASc score of  3, there is an indication for chronic anticoagulation for thromboembolic prophylaxis. Consistent with this, patient is chronically anticoagulated on warfarin, with presenting INR noted to be 3.2.  As noted above, in the setting of his anticipated ensuing orthopedic surgery for presenting acute left femoral neck fracture, will hold home warfarin for now, with repeat INR in the morning, as further detailed above.  Home AV nodal blocking regimen: Metoprolol  tartrate 12.5 mg p.o. twice daily.  Most recent echocardiogram occurred in December 2023. Presenting EKG shows rate controlled atrial fibrillation/flutter, without overt evidence of acute ischemic changes, including no evidence of STEMI..   Plan: monitor strict I's & O's and daily weights. CMP/CBC in AM. Check serum mag level. Continue home AV nodal blocking regimen.  Hold home warfarin for now and repeat INR in the morning, as above.  Monitor on telemetry.                      #) History of seizures: Documented history of such, without clinical evidence to suggest active seizures at this time.  Outpatient antiepileptic regimen consists of: Keppra  500 mg p.o. twice daily as well as phenytoin  nightly.  In the context of plan for ensuing n.p.o. status leading up to orthopedic surgery, will transiently transition his home oral Keppra  to IV route of administration.   Plan: Continue outpatient phenytoin .  Hold home oral Keppra  for now in the setting of impending n.p.o. status.  Keppra  500 mg IV twice daily, first dose to occur this evening.                      #)  Hyperlipidemia: documented h/o such. On Setia as outpatient.   Plan: continue home Zetia .       DVT prophylaxis: SCD's   Code Status: Full code Family Communication:  none Disposition Plan: Per Rounding Team Consults called: EDP has contacted on-call orthopedic surgeon, Dr. Genelle with [facility], who conveys that Ortho care will formally consult see the patient in the morning;  Admission status: Inpatient     I SPENT GREATER THAN 75  MINUTES IN CLINICAL CARE TIME/MEDICAL DECISION-MAKING IN COMPLETING THIS ADMISSION.      Eva NOVAK Lavilla Delamora DO [facility]  From 7PM - 7AM   05/02/2024, 8:16 PM

## 2024-05-02 NOTE — Progress Notes (Signed)
 Orthopedic Tech Progress Note Patient Details:  Jeffery Copeland May 18, 1946 [id number] Level 2 Trauma  Patient ID: Jeffery Copeland, male   DOB: 10/25/1945, 78 y.o.   MRN: [id number]  Jeffery Copeland Bar 05/02/2024, 6:35 PM

## 2024-05-02 NOTE — ED Triage Notes (Addendum)
 Pt BIB [facility] from home d/t a fall. Pt is on warfarin. Pt lives by himself. He fell while trying to get up to his walker.  Pelvis injuries noted. Does not wear O2 at baseline, was placed on 2L by medics due to slightly low O2. A&O X4.  hx copd.

## 2024-05-03 DIAGNOSIS — S72002A Fracture of unspecified part of neck of left femur, initial encounter for closed fracture: Secondary | ICD-10-CM | POA: Diagnosis not present

## 2024-05-03 LAB — BASIC METABOLIC PANEL WITH GFR
Anion gap: 6 (ref 5–15)
BUN: 14 mg/dL (ref 8–23)
CO2: 29 mmol/L (ref 22–32)
Calcium: 8.2 mg/dL — ABNORMAL LOW (ref 8.9–10.3)
Chloride: 101 mmol/L (ref 98–111)
Creatinine, Ser: 0.95 mg/dL (ref 0.61–1.24)
GFR, Estimated: >60 mL/min (ref >60)
Glucose, Bld: 126 mg/dL — ABNORMAL HIGH (ref 70–99)
Potassium: 4.3 mmol/L (ref 3.5–5.1)
Sodium: 136 mmol/L (ref 135–145)

## 2024-05-03 LAB — CBC WITH DIFFERENTIAL/PLATELET
Abs Immature Granulocytes: 0.12 K/uL — ABNORMAL HIGH (ref 0.00–0.07)
Basophils Absolute: 0.1 K/uL (ref 0.0–0.1)
Basophils Relative: 0 %
Eosinophils Absolute: 0 K/uL (ref 0.0–0.5)
Eosinophils Relative: 0 %
HCT: 35.7 % — ABNORMAL LOW (ref 39.0–52.0)
Hemoglobin: 11.4 g/dL — ABNORMAL LOW (ref 13.0–17.0)
Immature Granulocytes: 1 %
Lymphocytes Relative: 6 %
Lymphs Abs: 1.2 K/uL (ref 0.7–4.0)
MCH: 32.2 pg (ref 26.0–34.0)
MCHC: 31.9 g/dL (ref 30.0–36.0)
MCV: 100.8 fL — ABNORMAL HIGH (ref 80.0–100.0)
Monocytes Absolute: 2 K/uL — ABNORMAL HIGH (ref 0.1–1.0)
Monocytes Relative: 10 %
Neutro Abs: 16.8 K/uL — ABNORMAL HIGH (ref 1.7–7.7)
Neutrophils Relative %: 83 %
Platelets: 168 K/uL (ref 150–400)
RBC: 3.54 MIL/uL — ABNORMAL LOW (ref 4.22–5.81)
RDW: 13.4 % (ref 11.5–15.5)
WBC: 20.2 K/uL — ABNORMAL HIGH (ref 4.0–10.5)
nRBC: 0 % (ref 0.0–0.2)

## 2024-05-03 LAB — COMPREHENSIVE METABOLIC PANEL WITH GFR
ALT: 13 U/L (ref 0–44)
AST: 17 U/L (ref 15–41)
Albumin: 3 g/dL — ABNORMAL LOW (ref 3.5–5.0)
Alkaline Phosphatase: 102 U/L (ref 38–126)
Anion gap: 12 (ref 5–15)
BUN: 11 mg/dL (ref 8–23)
CO2: 24 mmol/L (ref 22–32)
Calcium: 8.3 mg/dL — ABNORMAL LOW (ref 8.9–10.3)
Chloride: 102 mmol/L (ref 98–111)
Creatinine, Ser: 1.18 mg/dL (ref 0.61–1.24)
GFR, Estimated: >60 mL/min (ref >60)
Glucose, Bld: 131 mg/dL — ABNORMAL HIGH (ref 70–99)
Potassium: 5.2 mmol/L — ABNORMAL HIGH (ref 3.5–5.1)
Sodium: 138 mmol/L (ref 135–145)
Total Bilirubin: 0.7 mg/dL (ref 0.0–1.2)
Total Protein: 5.5 g/dL — ABNORMAL LOW (ref 6.5–8.1)

## 2024-05-03 LAB — PROTIME-INR
INR: 4.2 (ref 0.8–1.2)
INR: 1.6 — ABNORMAL HIGH (ref 0.8–1.2)
Prothrombin Time: 41.9 s — ABNORMAL HIGH (ref 11.4–15.2)
Prothrombin Time: 19.6 s — ABNORMAL HIGH (ref 11.4–15.2)

## 2024-05-03 LAB — MAGNESIUM
Magnesium: 2.1 mg/dL (ref 1.7–2.4)
Magnesium: 2 mg/dL (ref 1.7–2.4)

## 2024-05-03 LAB — PHOSPHORUS: Phosphorus: 5 mg/dL — ABNORMAL HIGH (ref 2.5–4.6)

## 2024-05-03 MED ORDER — VITAMIN K1 10 MG/ML IJ SOLN
2.0000 mg | Freq: Once | INTRAVENOUS | Status: AC
  Administered 2024-05-03: 2 mg via INTRAVENOUS
  Filled 2024-05-03: qty 0.2

## 2024-05-03 MED ORDER — INFLUENZA VAC SPLIT HIGH-DOSE 0.5 ML IM SUSY
0.5000 mL | PREFILLED_SYRINGE | INTRAMUSCULAR | Status: AC
  Administered 2024-05-05: 0.5 mL via INTRAMUSCULAR
  Filled 2024-05-03: qty 0.5

## 2024-05-03 MED ORDER — TIZANIDINE HCL 4 MG PO TABS
2.0000 mg | ORAL_TABLET | Freq: Three times a day (TID) | ORAL | Status: DC | PRN
  Administered 2024-05-05: 2 mg via ORAL
  Filled 2024-05-03: qty 1

## 2024-05-03 MED ORDER — VITAMIN K1 10 MG/ML IJ SOLN
2.0000 mg | Freq: Once | INTRAVENOUS | Status: DC
  Filled 2024-05-03: qty 0.2

## 2024-05-03 MED ORDER — TAMSULOSIN HCL 0.4 MG PO CAPS
0.4000 mg | ORAL_CAPSULE | Freq: Every day | ORAL | Status: DC
  Administered 2024-05-03 – 2024-05-06 (×3): 0.4 mg via ORAL
  Filled 2024-05-03 (×3): qty 1

## 2024-05-03 MED ORDER — DILTIAZEM HCL-DEXTROSE 125-5 MG/125ML-% IV SOLN (PREMIX)
5.0000 mg/h | INTRAVENOUS | Status: DC
  Administered 2024-05-03 – 2024-05-04 (×2): 5 mg/h via INTRAVENOUS
  Filled 2024-05-03 (×2): qty 125

## 2024-05-03 MED ORDER — SODIUM ZIRCONIUM CYCLOSILICATE 10 G PO PACK
10.0000 g | PACK | Freq: Once | ORAL | Status: AC
  Administered 2024-05-03: 10 g via ORAL
  Filled 2024-05-03: qty 1

## 2024-05-03 NOTE — Progress Notes (Signed)
 Transition of Care [facility]) - CAGE-AID Screening   Patient Details  Name: Jeffery Copeland MRN: [id number] Date of Birth: [birth year]   MARINDA LIONEL [name], RN Phone Number: 05/03/2024, 6:31 AM   Clinical Narrative:  No active tobacco, etoh, or drug usage. No resources needed at this time  CAGE-AID Screening:    Have You Ever Felt You Ought to Cut Down on Your Drinking or Drug Use?: No Have People Annoyed You By Critizing Your Drinking Or Drug Use?: No Have You Felt Bad Or Guilty About Your Drinking Or Drug Use?: No Have You Ever Had a Drink or Used Drugs First Thing In The Morning to Steady Your Nerves or to Get Rid of a Hangover?: No CAGE-AID Score: 0  Substance Abuse Education Offered: No

## 2024-05-03 NOTE — ED Notes (Signed)
 PER Dr. Tobie pt can ear due to high INR.  Surgery will be tomorrow.

## 2024-05-03 NOTE — Progress Notes (Signed)
 [facility] Progress Note Patient: Jeffery Copeland FMW:[id number] DOB: 04-08-1946  DOA: 05/02/2024 DOS: the patient was seen and examined on 05/03/2024  Brief Hospital Course: Patient with PMH of COPD, paroxysmal A-fib, on warfarin, seizures, bioprosthetic MVR x 2, HLD, chronic hypoxic respiratory failure on 2 LPM. Present to the hospital with a mechanical fall. Reports that he was walking and tripped over and had a fall.  Denies having any head injury or neck injury. Found to have a left femoral neck fracture. Orthopedic surgery was consulted. Patient was altered and scheduled for surgery on 10/5.  Now surgeries for 10/6 due to elevated INR.  Assessment and Plan: Left hip fracture. Mechanical fall. Denies any head injury or neck injury. Orthopedic surgery consulted. Currently scheduled for intramedullary rod of on 10/6. Per orthopedic surgery goal INR less than 2 for surgery. Patient received vitamin K 2 mg IV for this.  Preoperative medical evaluation Charlanne dunker al. Estimated Risk Probability for Perioperative Myocardial Infarction or Cardiac Arrest: 0.8 % Pt can climb a flight of stair, ambulates around house using any device, without any shortness of breath or chest pain. Has h/o severe COPD on 2 LPM without exacerbation Chest X ray concerning for significant pneumonia Echocardiogram ordered Based on RCRI 30-day risk of death, MI, or cardiac arrest is 0.5 %  Recommend further workup with echocardiogram prior to surgery for preop cardiac evaluation.  Long discussion with son and patient at bedside, they understands patient will be at moderate to high risk for the surgical procedure. Alternative surgical intervention were discussed by the surgery team. Family accepts all risks and wants to proceed with surgical intervention.   Paroxysmal A-fib. History of bioprosthetic MVR x 2. On Coumadin . INR supratherapeutic. Received vitamin K 10 mg IV. Repeat INR is 2. Monitor daily INR  for the patient Currently RVR. Initiate Cardizem drip.  History of seizure On Keppra  and Dilantin . No recent seizures witnessed by family.  BPH. On Flomax  Continue PEG  HLD. Zetia .  Leukocytosis. Likely stress demarcation. Will monitor.   Goals of care conversation. Patient's chart shows that he has a DNR signed few years ago. Confirm with patient's family at bedside that they would like to continue DNR while here in the hospital. They understand that perioperatively this DNR will be revoked.  Subjective: Pain is still present in the hip.  No nausea no vomiting no fever no chills.  Breathing okay.  Physical Exam: Bilateral basal crackles. No wheezing. S1-S2 present. Telemetry shows tachycardia with RVR. Bowel sound present.  Nontender. No edema. Alert awake and oriented to place and person.  Data Reviewed: I have Reviewed nursing notes, Vitals, and Lab results. Since last encounter, pertinent lab results CBC and BMP   . I have ordered test including CBC and BMP  .  Discussed with orthopedics  Disposition: Status is: Inpatient Remains inpatient appropriate because: Monitor postop recovery  SCDs Start: 05/02/24 2007   Family Communication: Family at bedside Level of care: Progressive   Vitals:   05/03/24 1435 05/03/24 1500 05/03/24 1632 05/03/24 1700  BP:   132/85   Pulse: 85 68 60 73  Resp: 18 17 17 20   Temp:   98.3 F (36.8 C)   TempSrc:   Oral   SpO2: 96% 98% 93% 97%  Weight:      Height:         Author: Yetta Blanch, MD 05/03/2024 7:50 PM  Please look on [email or web address] to find out who is on call.

## 2024-05-03 NOTE — ED Notes (Addendum)
 PT is currently on 2L nasal cannula. PT alert and oriented x4. Breathing is even and unlabored. Call light within reach, encouraged to use when needs arise.

## 2024-05-03 NOTE — Anesthesia Preprocedure Evaluation (Signed)
 Anesthesia Evaluation  Patient identified by MRN, date of birth, ID band Patient awake    Reviewed: Allergy & Precautions, H&P , NPO status , Patient's Chart, lab work & pertinent test results  Airway Mallampati: II   Neck ROM: full    Dental   Pulmonary shortness of breath, COPD, former smoker   breath sounds clear to auscultation       Cardiovascular hypertension, +CHF  + dysrhythmias Atrial Fibrillation + Valvular Problems/Murmurs  Rhythm:regular Rate:Normal  S/p MVR 1999 S/p re-do mini MVR 2014  Echo (2023): EF 55-60%, mitral valve mean PG 3.5 mmHg, no regurgitation.   Neuro/Psych Seizures -,     GI/Hepatic ,GERD  ,,  Endo/Other    Renal/GU      Musculoskeletal  (+) Arthritis ,    Abdominal   Peds  Hematology   Anesthesia Other Findings   Reproductive/Obstetrics                              Anesthesia Physical Anesthesia Plan  ASA: 3  Anesthesia Plan: General   Post-op Pain Management:    Induction: Intravenous  PONV Risk Score and Plan: 2 and Ondansetron , Dexamethasone and Treatment may vary due to age or medical condition  Airway Management Planned: Oral ETT  Additional Equipment:   Intra-op Plan:   Post-operative Plan: Extubation in OR  Informed Consent: I have reviewed the patients History and Physical, chart, labs and discussed the procedure including the risks, benefits and alternatives for the proposed anesthesia with the patient or authorized representative who has indicated his/her understanding and acceptance.     Dental advisory given  Plan Discussed with: CRNA, Anesthesiologist and Surgeon  Anesthesia Plan Comments:         Anesthesia Quick Evaluation

## 2024-05-03 NOTE — Consult Note (Signed)
 ORTHOPAEDIC CONSULTATION  REQUESTING PHYSICIAN: Tobie Yetta HERO, MD  Chief Complaint: Left hip pain  HPI: Jeffery Copeland is a 78 y.o. male who presents with left intertrochanteric hip fracture after a fall from ground level.  Of note he is on warfarin for paroxysmal A-fib/a flutter.  He is currently in the emergency room.  Orthopedics was consulted for definitive management of the hip.  He is a home ambulator without assistive devices  Past Medical History:  Diagnosis Date   Acute and subacute bacterial endocarditis 1999   group G Streptococcus   Atrial fibrillation (HCC) 11/11/2008   Qualifier: Diagnosis of  By: Morris, MD, CODY Debby Lenis    CARDIOMYOPATHY 10/20/2008   Qualifier: Diagnosis of  By: Delores, RN, BSN, Lauren     Cerebrovascular disease, unspecified    Closed right acetabular fracture (HCC)    COPD (chronic obstructive pulmonary disease) (HCC) 1999   Epidural hematoma (HCC) 03/01/2011   Recent fall 2012    Esophageal reflux    Generalized osteoarthrosis, unspecified site    History of tobacco abuse    Hypertension    HYPERTENSION, BENIGN 10/20/2008   Qualifier: Diagnosis of  By: Delores, RN, BSN, Lauren     Hyponatremia    Insomnia    Iron deficiency anemia, unspecified    ISCHEMIC COLITIS, HX OF 10/20/2008   Qualifier: Diagnosis of  By: Delores, RN, BSN, Lauren     Lung nodules 2008, 2014   LLL nodule removed 2008 with LLL lobectomy,  RLL nodule 3mm observation   Prostate cancer (HCC) 2015   Protruding sternal wires    S/P mitral valve replacement 02/01/1998   Carpentier-Edwards porcine bioprosthetic tissue valve, size 31mm Placed for acute and subacute bacterial endocarditis (group G Streptococcus) with pre-existing mitral valve prolapse    S/P mitral valve replacement with bioprosthetic valve 03/24/2013   Redo mitral valve replacement using 29mm Edwards [facility] mitral bovine bioprosthetic tissue valve performed via right mini thoracotomy   Seizures (HCC)    last  seizure 1 year ago   Severe mitral regurgitation 02/20/2013   Unsteady gait    Past Surgical History:  Procedure Laterality Date   CARDIAC VALVE REPLACEMENT     INTRAOPERATIVE TRANSESOPHAGEAL ECHOCARDIOGRAM N/A 03/24/2013   Procedure: INTRAOPERATIVE TRANSESOPHAGEAL ECHOCARDIOGRAM;  Surgeon: Sudie VEAR Laine, MD;  Location: [facility] OR;  Service: Open Heart Surgery;  Laterality: N/A;   MINIMALLY INVASIVE MAZE PROCEDURE N/A 03/24/2013   Procedure: MINIMALLY INVASIVE MAZE PROCEDURE;  Surgeon: Sudie VEAR Laine, MD;  Location: [facility] OR;  Service: Open Heart Surgery;  Laterality: N/A;   MITRAL VALVE REPLACEMENT  02/01/1998   Carpentier-Edwards porcine bioprosthetic tissue valve, size 31mm, placed for complicated bacterial endocarditis   MITRAL VALVE REPLACEMENT Right 03/24/2013   Procedure: MINIMALLY INVASIVE REDO MITRAL VALVE (MV) REPLACEMENT;  Surgeon: Sudie VEAR Laine, MD;  Location: [facility] OR;  Service: Open Heart Surgery;  Laterality: Right;   PROSTATE BIOPSY     radiation treatment tx for prostate cancer  2015   STERNAL WIRES REMOVAL N/A 07/10/2018   Procedure: STERNAL WIRES REMOVAL;  Surgeon: Laine Sudie VEAR, MD;  Location: [facility] OR;  Service: Thoracic;  Laterality: N/A;   TEE WITHOUT CARDIOVERSION N/A 02/12/2013   Procedure: TRANSESOPHAGEAL ECHOCARDIOGRAM (TEE);  Surgeon: Vina LULLA Gull, MD;  Location: [facility] ENDOSCOPY;  Service: Cardiovascular;  Laterality: N/A;   VIDEO ASSISTED THORACOSCOPY  04/29/2007   Left VATS w/ mini thoracotomy for Left Lower Lobectomy for benign lung nodules   Social History  Socioeconomic History   Marital status: Divorced    Spouse name: Not on file   Number of children: 2   Years of education: 9   Highest education level: Not on file  Occupational History   Occupation: disabled  Tobacco Use   Smoking status: Former    Current packs/day: 0.00    Average packs/day: 2.0 packs/day for 45.0 years (90.0 ttl pk-yrs)    Types: Cigarettes    Start date: 07/30/1961    Quit date:  07/30/2006    Years since quitting: 17.7   Smokeless tobacco: Never  Vaping Use   Vaping status: Never Used  Substance and Sexual Activity   Alcohol use: No    Alcohol/week: 0.0 standard drinks of alcohol    Comment: previous alcohol use & home-made alcohol consumption   Drug use: No   Sexual activity: Not Currently  Other Topics Concern   Not on file  Social History Narrative   Patient is divorced and has 2 children.   Patient is right handed.   Patient has 9 th grade education.    Patient drinks diet sodas.       Pulmonary:   From [place] originally. Always lived in [place]. 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 Questionable asbestos exposure. Previously traveled to [place], [place], & [place]. No pets currently. Remote pet owl and parakeet exposure. No mold exposure. Enjoys fishing.    Social Drivers of Corporate investment banker Strain: Low Risk  (07/10/2023)   Overall Financial Resource Strain (CARDIA)    Difficulty of Paying Living Expenses: Not hard at all  Food Insecurity: No Food Insecurity (05/02/2024)   Hunger Vital Sign    Worried About Running Out of Food in the Last Year: Never true    Ran Out of Food in the Last Year: Never true  Transportation Needs: No Transportation Needs (05/02/2024)   PRAPARE - Administrator, Civil Service (Medical): No    Lack of Transportation (Non-Medical): No  Physical Activity: Inactive (07/10/2023)   Exercise Vital Sign    Days of Exercise per Week: 0 days    Minutes of Exercise per Session: 0 min  Stress: No Stress Concern Present (07/10/2023)   [employer] of Occupational Health - Occupational Stress Questionnaire    Feeling of Stress : Not at all  Social Connections: Socially Isolated (05/02/2024)   Social Connection and Isolation Panel    Frequency of Communication with Friends and Family: Once a week    Frequency of Social Gatherings with Friends and Family: Three times a week    Attends Religious Services: Never    Active  Member of Clubs or Organizations: No    Attends Engineer, structural: Never    Marital Status: Divorced   Family History  Problem Relation Age of Onset   Cancer Father        prostate cancer   Tuberculosis Father    Heart attack Father    Cancer Brother        prostate cancer   Arthritis Mother    Lung disease Neg Hx    - negative except otherwise stated in the family history section Allergies  Allergen Reactions   Ace Inhibitors Swelling   Lisinopril      Seizures    Ramipril      Other Reaction(s): gum swelling   Rosuvastatin Other (See Comments)    Muscle aches   Prior to Admission medications   Medication Sig Start Date End Date Taking?  Authorizing Provider  albuterol  (VENTOLIN  HFA) 108 (90 Base) MCG/ACT inhaler Inhale 2 puffs into the lungs every 6 (six) hours as needed for wheezing or shortness of breath. 09/26/23  Yes Kara Dorn NOVAK, MD  Budeson-Glycopyrrol-Formoterol (BREZTRI  AEROSPHERE) 160-9-4.8 MCG/ACT AERO Inhale 2 puffs into the lungs in the morning and at bedtime. 09/26/23  Yes Kara Dorn NOVAK, MD  cholecalciferol  (VITAMIN D3) 25 MCG (1000 UNIT) tablet Take 1,000 Units by mouth daily.   Yes [provider]  ezetimibe  (ZETIA ) 10 MG tablet TAKE 1 TABLET EVERY DAY 11/26/23  Yes Nafziger, Darleene, NP  levETIRAcetam  (KEPPRA ) 500 MG tablet Take 1 tablet (500 mg total) by mouth 2 (two) times daily. 02/25/24  Yes McCue, Harlene, NP  metoprolol  tartrate (LOPRESSOR ) 25 MG tablet TAKE 1/2 TABLET TWICE DAILY 07/02/23  Yes Nafziger, Darleene, NP  Multiple Vitamins-Minerals (PRESERVISION AREDS 2+MULTI VIT PO) Take 1 capsule by mouth daily.   Yes [provider]  phenytoin  (DILANTIN ) 100 MG ER capsule Take 3 capsules (300 mg total) by mouth at bedtime. 02/25/24  Yes McCue, Harlene, NP  tamsulosin  (FLOMAX ) 0.4 MG CAPS capsule Take 0.4 mg by mouth daily.   Yes [provider]  tiZANidine  (ZANAFLEX ) 4 MG tablet TAKE 1 TABLET EVERY 6 HOURS AS NEEDED FOR  MUSCLE SPASMS 08/14/23  Yes Nafziger, Darleene, NP  traMADol  (ULTRAM ) 50 MG tablet TAKE 1 TABLET EVERY 8 HOURS AS NEEDED 04/07/24  Yes Nafziger, Darleene, NP  warfarin (COUMADIN ) 6 MG tablet TAKE 1/2 TO 1 TABLET ONE TIME DAILY AS DIRECTED BY COUMADIN  CLINIC Patient taking differently: TAKE 1/2 TO 1 TABLET ONE TIME DAILY AS DIRECTED BY COUMADIN  CLINIC. Takes 6 mg on Sundays and 3 mg on all other days. 02/24/24  Yes [name], MD   DG Hip Unilat With Pelvis 2-3 Views Left Result Date: 05/02/2024 CLINICAL DATA:  fall, pain in hip EXAM: DG HIP (WITH OR WITHOUT PELVIS) 2-3V LEFT COMPARISON:  None Available. FINDINGS: Osteopenia. No pelvic fracture or pelvic bone diastasis.Impacted, comminuted and displaced intertrochanteric fracture of the left femoral neck. The femoral shaft is medially displaced 2.1 cm.No hip dislocation.Degenerative disc disease of the visualized lumbar spine. Aortoiliac and peripheral vascular atherosclerosis. IMPRESSION: Comminuted, displaced intertrochanteric fracture of the left femoral neck. Electronically Signed   By: Rogelia Myers M.D.   On: 05/02/2024 19:08   DG Chest Port 1 View Result Date: 05/02/2024 CLINICAL DATA:  fall, pain in hip EXAM: PORTABLE CHEST - 1 VIEW COMPARISON:  03/13/2023 FINDINGS: Low lung volumes. Diffuse bilateral perihilar interstitial opacities. Similar chronic blunting and irregular contour of the left costophrenic sulcus and hemidiaphragm. No focal airspace consolidation, pleural effusion, or pneumothorax. Moderate cardiomegaly. Cardiac valve replacement. Sternotomy wires. Tortuous aorta with aortic atherosclerosis. No acute fracture or destructive lesions. Multilevel thoracic osteophytosis. IMPRESSION: Low lung volumes. Bilateral perihilar interstitial opacities, which may represent bronchovascular crowding due to low lung volumes, interstitial edema, or atypical/viral infection, in the correct clinical context. Electronically Signed   By: Rogelia Myers M.D.    On: 05/02/2024 19:06     Positive ROS: All other systems have been reviewed and were otherwise negative with the exception of those mentioned in the HPI and as above.  Physical Exam: General: No acute distress Cardiovascular: No pedal edema Respiratory: No cyanosis, no use of accessory musculature GI: No organomegaly, abdomen is soft and non-tender Skin: No lesions in the area of chief complaint Neurologic: Sensation intact distally Psychiatric: Patient is at baseline mood and affect Lymphatic: No axillary or  cervical lymphadenopathy  MUSCULOSKELETAL:  Left hip is shortened externally rotated.  Able to fire tibialis anterior as well as gastrocsoleus 2+ dorsalis pedis pulse  Independent Imaging Review  Left hip 2 views: Displaced intertrochanteric hip fracture  Assessment: 78 year old male with a left displaced intertrochanteric hip fracture after fall from ground-level.  I did discuss the risks and benefits of treatment.  Overall I would recommend cephalomedullary nailing to promote early optimization.  His INR is currently elevated at 4.2 but I did discuss that I would like this to be below 2 prior to surgical intervention.  He may have a diet and he today will plan for left hip cephalomedullary fixation on 10/6  Plan: Plan n.p.o. at midnight for left hip cephalomedullary fixation   After a lengthy discussion of treatment options, including risks, benefits, alternatives, complications of surgical and nonsurgical conservative options, the patient elected surgical repair.   The patient  is aware of the material risks  and complications including, but not limited to injury to adjacent structures, neurovascular injury, infection, numbness, bleeding, implant failure, thermal burns, stiffness, persistent pain, failure to heal, disease transmission from allograft, need for further surgery, dislocation, anesthetic risks, blood clots, risks of death,and others. The probabilities of surgical  success and failure discussed with patient given their particular co-morbidities.The time and nature of expected rehabilitation and recovery was discussed.The patient's questions were all answered preoperatively.  No barriers to understanding were noted. I explained the natural history of the disease process and Rx rationale.  I explained to the patient what I considered to be reasonable expectations given their personal situation.  The final treatment plan was arrived at through a shared patient decision making process model.   Thank you for the consult and the opportunity to see Mr. Rena Sweeden, MD [facility] 8:40 AM

## 2024-05-03 NOTE — Hospital Course (Signed)
 Patient with PMH of COPD, paroxysmal A-fib, on warfarin, seizures, bioprosthetic MVR x 2, HLD, chronic hypoxic respiratory failure on 2 LPM. Present to the hospital with a mechanical fall. Reports that he was walking and tripped over and had a fall.  Denies having any head injury or neck injury. Found to have a left femoral neck fracture. Orthopedic surgery was consulted. Patient was altered and scheduled for surgery on 10/5.  Now surgeries for 10/6 due to elevated INR.  Assessment and Plan: Left hip fracture. Mechanical fall. Denies any head injury or neck injury. Orthopedic surgery consulted. Currently scheduled for intramedullary rod of on 10/6. Per orthopedic surgery goal INR less than 2 for surgery. Patient received vitamin K 2 mg IV for this. Underwent surgery and tolerated it well. Postop recovery.  Preoperative medical evaluation Charlanne dunker al. Estimated Risk Probability for Perioperative Myocardial Infarction or Cardiac Arrest: 0.8 % Pt can climb a flight of stair, ambulates around house using any device, without any shortness of breath or chest pain. Has h/o severe COPD on 2 LPM without exacerbation Chest X ray concerning for significant pneumonia Echocardiogram ordered Based on RCRI 30-day risk of death, MI, or cardiac arrest is 0.5 %  Long discussion with son and patient at bedside, they understands patient will be at moderate to high risk for the surgical procedure. Alternative surgical intervention were discussed by the surgery team. Family accepts all risks and wants to proceed with surgical intervention.   Paroxysmal A-fib. History of bioprosthetic MVR x 2. On Coumadin . INR supratherapeutic. Received vitamin K 10 mg IV. Repeat INR is 2. Monitor daily INR for the patient Currently RVR. Initiate Cardizem drip.  History of seizure On Keppra  and Dilantin . No recent seizures witnessed by family.  BPH. On Flomax  Continue  PEG  HLD. Zetia .  Leukocytosis. Likely stress demarcation. Will monitor.  History of COPD. Continue nebulizer therapy.  Continue Mucinex. There is concern for potential development of respiratory illness.  Will monitor clinically for now.  No evidence of exacerbation right now on exam.

## 2024-05-04 ENCOUNTER — Inpatient Hospital Stay (HOSPITAL_COMMUNITY)

## 2024-05-04 ENCOUNTER — Other Ambulatory Visit: Payer: Self-pay

## 2024-05-04 ENCOUNTER — Inpatient Hospital Stay (HOSPITAL_COMMUNITY): Payer: Self-pay | Admitting: Anesthesiology

## 2024-05-04 ENCOUNTER — Encounter (HOSPITAL_COMMUNITY)
Admission: EM | Disposition: A | Discharge status: Skilled Nursing Facility | Payer: Self-pay | Source: Home / Self Care | Attending: Internal Medicine

## 2024-05-04 ENCOUNTER — Encounter (HOSPITAL_COMMUNITY): Payer: Self-pay | Admitting: Internal Medicine

## 2024-05-04 DIAGNOSIS — I110 Hypertensive heart disease with heart failure: Secondary | ICD-10-CM

## 2024-05-04 DIAGNOSIS — Z9889 Other specified postprocedural states: Secondary | ICD-10-CM | POA: Diagnosis not present

## 2024-05-04 DIAGNOSIS — Z87891 Personal history of nicotine dependence: Secondary | ICD-10-CM

## 2024-05-04 DIAGNOSIS — Z01810 Encounter for preprocedural cardiovascular examination: Secondary | ICD-10-CM | POA: Diagnosis not present

## 2024-05-04 DIAGNOSIS — S72102A Unspecified trochanteric fracture of left femur, initial encounter for closed fracture: Secondary | ICD-10-CM

## 2024-05-04 DIAGNOSIS — S72002A Fracture of unspecified part of neck of left femur, initial encounter for closed fracture: Secondary | ICD-10-CM | POA: Diagnosis not present

## 2024-05-04 DIAGNOSIS — I5032 Chronic diastolic (congestive) heart failure: Secondary | ICD-10-CM

## 2024-05-04 DIAGNOSIS — Z01818 Encounter for other preprocedural examination: Secondary | ICD-10-CM | POA: Diagnosis not present

## 2024-05-04 DIAGNOSIS — R600 Localized edema: Secondary | ICD-10-CM | POA: Diagnosis not present

## 2024-05-04 HISTORY — PX: FEMUR IM NAIL: SHX1597

## 2024-05-04 LAB — ECHOCARDIOGRAM COMPLETE
Area-P 1/2: 4.29 cm2
Calc EF: 38.3 %
Height: 64 in
S' Lateral: 3.4 cm
Single Plane A2C EF: 35.2 %
Single Plane A4C EF: 41.3 %
Weight: 2352.01 [oz_av]

## 2024-05-04 LAB — BASIC METABOLIC PANEL WITH GFR
Anion gap: 8 (ref 5–15)
BUN: 13 mg/dL (ref 8–23)
CO2: 27 mmol/L (ref 22–32)
Calcium: 8 mg/dL — ABNORMAL LOW (ref 8.9–10.3)
Chloride: 101 mmol/L (ref 98–111)
Creatinine, Ser: 1.04 mg/dL (ref 0.61–1.24)
GFR, Estimated: >60 mL/min (ref >60)
Glucose, Bld: 123 mg/dL — ABNORMAL HIGH (ref 70–99)
Potassium: 4.2 mmol/L (ref 3.5–5.1)
Sodium: 136 mmol/L (ref 135–145)

## 2024-05-04 LAB — PROTIME-INR
INR: 1.1 (ref 0.8–1.2)
Prothrombin Time: 15.2 s (ref 11.4–15.2)

## 2024-05-04 LAB — CBC
HCT: 30.4 % — ABNORMAL LOW (ref 39.0–52.0)
Hemoglobin: 10.1 g/dL — ABNORMAL LOW (ref 13.0–17.0)
MCH: 33 pg (ref 26.0–34.0)
MCHC: 33.2 g/dL (ref 30.0–36.0)
MCV: 99.3 fL (ref 80.0–100.0)
Platelets: 122 K/uL — ABNORMAL LOW (ref 150–400)
RBC: 3.06 MIL/uL — ABNORMAL LOW (ref 4.22–5.81)
RDW: 13.2 % (ref 11.5–15.5)
WBC: 11.4 K/uL — ABNORMAL HIGH (ref 4.0–10.5)
nRBC: 0 % (ref 0.0–0.2)

## 2024-05-04 LAB — BRAIN NATRIURETIC PEPTIDE: B Natriuretic Peptide: 231.4 pg/mL — ABNORMAL HIGH (ref 0.0–100.0)

## 2024-05-04 LAB — SURGICAL PCR SCREEN
MRSA, PCR: NEGATIVE
Staphylococcus aureus: NEGATIVE

## 2024-05-04 LAB — MAGNESIUM: Magnesium: 1.8 mg/dL (ref 1.7–2.4)

## 2024-05-04 SURGERY — INSERTION, INTRAMEDULLARY ROD, FEMUR
Anesthesia: General | Laterality: Left

## 2024-05-04 MED ORDER — CHLORHEXIDINE GLUCONATE 4 % EX SOLN
60.0000 mL | Freq: Once | CUTANEOUS | Status: DC
  Filled 2024-05-04: qty 60

## 2024-05-04 MED ORDER — HEPARIN SODIUM (PORCINE) 5000 UNIT/ML IJ SOLN
5000.0000 [IU] | Freq: Three times a day (TID) | INTRAMUSCULAR | Status: DC
  Administered 2024-05-04 – 2024-05-06 (×5): 5000 [IU] via SUBCUTANEOUS
  Filled 2024-05-04 (×5): qty 1

## 2024-05-04 MED ORDER — LIDOCAINE 2% (20 MG/ML) 5 ML SYRINGE
INTRAMUSCULAR | Status: AC
Start: 2024-05-04 — End: 2024-05-04
  Filled 2024-05-04: qty 5

## 2024-05-04 MED ORDER — PROPOFOL 10 MG/ML IV BOLUS
INTRAVENOUS | Status: DC | PRN
  Administered 2024-05-04: 70 mg via INTRAVENOUS
  Administered 2024-05-04: 10 mg via INTRAVENOUS

## 2024-05-04 MED ORDER — PROPOFOL 10 MG/ML IV BOLUS
INTRAVENOUS | Status: AC
  Filled 2024-05-04: qty 20

## 2024-05-04 MED ORDER — PHENYLEPHRINE 80 MCG/ML (10ML) SYRINGE FOR IV PUSH (FOR BLOOD PRESSURE SUPPORT)
PREFILLED_SYRINGE | INTRAVENOUS | Status: AC
  Filled 2024-05-04: qty 10

## 2024-05-04 MED ORDER — CEFAZOLIN SODIUM-DEXTROSE 2-4 GM/100ML-% IV SOLN
2.0000 g | Freq: Four times a day (QID) | INTRAVENOUS | Status: AC
  Administered 2024-05-04 (×2): 2 g via INTRAVENOUS
  Filled 2024-05-04 (×2): qty 100

## 2024-05-04 MED ORDER — FENTANYL CITRATE (PF) 250 MCG/5ML IJ SOLN
INTRAMUSCULAR | Status: DC | PRN
  Administered 2024-05-04 (×2): 100 ug via INTRAVENOUS
  Administered 2024-05-04: 50 ug via INTRAVENOUS

## 2024-05-04 MED ORDER — FENTANYL CITRATE (PF) 250 MCG/5ML IJ SOLN
INTRAMUSCULAR | Status: AC
  Filled 2024-05-04: qty 5

## 2024-05-04 MED ORDER — DOCUSATE SODIUM 100 MG PO CAPS
100.0000 mg | ORAL_CAPSULE | Freq: Two times a day (BID) | ORAL | Status: DC
  Administered 2024-05-04 – 2024-05-06 (×4): 100 mg via ORAL
  Filled 2024-05-04 (×4): qty 1

## 2024-05-04 MED ORDER — ACETAMINOPHEN 10 MG/ML IV SOLN
INTRAVENOUS | Status: AC
  Filled 2024-05-04: qty 100

## 2024-05-04 MED ORDER — METOCLOPRAMIDE HCL 5 MG/ML IJ SOLN: 5.0000 mg | Freq: Three times a day (TID) | INTRAMUSCULAR | Status: DC | PRN

## 2024-05-04 MED ORDER — WARFARIN SODIUM 5 MG PO TABS
6.0000 mg | ORAL_TABLET | Freq: Once | ORAL | Status: AC
  Administered 2024-05-04: 6 mg via ORAL
  Filled 2024-05-04: qty 1

## 2024-05-04 MED ORDER — CHLORHEXIDINE GLUCONATE 0.12 % MT SOLN: 15.0000 mL | Freq: Once | OROMUCOSAL | Status: AC

## 2024-05-04 MED ORDER — PHENYLEPHRINE HCL-NACL 20-0.9 MG/250ML-% IV SOLN
INTRAVENOUS | Status: DC | PRN
  Administered 2024-05-04: 40 ug/min via INTRAVENOUS

## 2024-05-04 MED ORDER — ROCURONIUM BROMIDE 10 MG/ML (PF) SYRINGE
PREFILLED_SYRINGE | INTRAVENOUS | Status: AC
Start: 2024-05-04 — End: 2024-05-04
  Filled 2024-05-04: qty 10

## 2024-05-04 MED ORDER — ORAL CARE MOUTH RINSE: 15.0000 mL | Freq: Once | OROMUCOSAL | Status: AC

## 2024-05-04 MED ORDER — WARFARIN - PHARMACIST DOSING INPATIENT: Freq: Every day | Status: DC

## 2024-05-04 MED ORDER — ROCURONIUM BROMIDE 10 MG/ML (PF) SYRINGE
PREFILLED_SYRINGE | INTRAVENOUS | Status: DC | PRN
  Administered 2024-05-04: 20 mg via INTRAVENOUS
  Administered 2024-05-04: 50 mg via INTRAVENOUS
  Administered 2024-05-04: 10 mg via INTRAVENOUS

## 2024-05-04 MED ORDER — LACTATED RINGERS IV SOLN
INTRAVENOUS | Status: DC
Start: 2024-05-04 — End: 2024-05-04

## 2024-05-04 MED ORDER — SUGAMMADEX SODIUM 200 MG/2ML IV SOLN
INTRAVENOUS | Status: DC | PRN
  Administered 2024-05-04: 135 mg via INTRAVENOUS

## 2024-05-04 MED ORDER — ACETAMINOPHEN 10 MG/ML IV SOLN
INTRAVENOUS | Status: DC | PRN
Start: 2024-05-04 — End: 2024-05-04
  Administered 2024-05-04: 1000 mg via INTRAVENOUS

## 2024-05-04 MED ORDER — DEXMEDETOMIDINE HCL IN NACL 80 MCG/20ML IV SOLN
INTRAVENOUS | Status: AC
  Filled 2024-05-04: qty 20

## 2024-05-04 MED ORDER — CHLORHEXIDINE GLUCONATE 0.12 % MT SOLN
OROMUCOSAL | Status: AC
  Administered 2024-05-04: 15 mL via OROMUCOSAL
  Filled 2024-05-04: qty 15

## 2024-05-04 MED ORDER — TRANEXAMIC ACID-NACL 1000-0.7 MG/100ML-% IV SOLN
1000.0000 mg | INTRAVENOUS | Status: AC
  Administered 2024-05-04: 1000 mg via INTRAVENOUS
  Filled 2024-05-04: qty 100

## 2024-05-04 MED ORDER — DEXAMETHASONE SODIUM PHOSPHATE 10 MG/ML IJ SOLN
INTRAMUSCULAR | Status: DC | PRN
  Administered 2024-05-04: 10 mg via INTRAVENOUS

## 2024-05-04 MED ORDER — DEXAMETHASONE SODIUM PHOSPHATE 10 MG/ML IJ SOLN
INTRAMUSCULAR | Status: AC
  Filled 2024-05-04: qty 1

## 2024-05-04 MED ORDER — ONDANSETRON HCL 4 MG/2ML IJ SOLN
INTRAMUSCULAR | Status: AC
  Filled 2024-05-04: qty 2

## 2024-05-04 MED ORDER — LIDOCAINE 2% (20 MG/ML) 5 ML SYRINGE
INTRAMUSCULAR | Status: DC | PRN
  Administered 2024-05-04: 40 mg via INTRAVENOUS

## 2024-05-04 MED ORDER — OXYCODONE HCL 5 MG PO TABS
5.0000 mg | ORAL_TABLET | ORAL | Status: DC | PRN
Start: 2024-05-04 — End: 2024-05-06
  Administered 2024-05-05 – 2024-05-06 (×4): 5 mg via ORAL
  Filled 2024-05-04 (×4): qty 1

## 2024-05-04 MED ORDER — DEXMEDETOMIDINE HCL IN NACL 80 MCG/20ML IV SOLN
INTRAVENOUS | Status: DC | PRN
  Administered 2024-05-04 (×2): 8 ug via INTRAVENOUS

## 2024-05-04 MED ORDER — METOCLOPRAMIDE HCL 5 MG PO TABS: 5.0000 mg | ORAL_TABLET | Freq: Three times a day (TID) | ORAL | Status: DC | PRN

## 2024-05-04 MED ORDER — GUAIFENESIN ER 600 MG PO TB12
600.0000 mg | ORAL_TABLET | Freq: Two times a day (BID) | ORAL | Status: DC
  Administered 2024-05-04 – 2024-05-06 (×4): 600 mg via ORAL
  Filled 2024-05-04 (×4): qty 1

## 2024-05-04 MED ORDER — ACETAMINOPHEN 500 MG PO TABS
500.0000 mg | ORAL_TABLET | Freq: Three times a day (TID) | ORAL | Status: DC
  Administered 2024-05-04 – 2024-05-06 (×5): 500 mg via ORAL
  Filled 2024-05-04 (×5): qty 1

## 2024-05-04 MED ORDER — 0.9 % SODIUM CHLORIDE (POUR BTL) OPTIME
TOPICAL | Status: DC | PRN
  Administered 2024-05-04: 1000 mL

## 2024-05-04 MED ORDER — MORPHINE SULFATE (PF) 2 MG/ML IV SOLN: 0.5000 mg | INTRAVENOUS | Status: DC | PRN

## 2024-05-04 MED ORDER — METOPROLOL TARTRATE 5 MG/5ML IV SOLN
INTRAVENOUS | Status: DC | PRN
  Administered 2024-05-04: 1 mg via INTRAVENOUS

## 2024-05-04 MED ORDER — IPRATROPIUM-ALBUTEROL 0.5-2.5 (3) MG/3ML IN SOLN
3.0000 mL | Freq: Four times a day (QID) | RESPIRATORY_TRACT | Status: DC
  Administered 2024-05-04: 3 mL via RESPIRATORY_TRACT
  Filled 2024-05-04 (×2): qty 3

## 2024-05-04 MED ORDER — CEFAZOLIN SODIUM-DEXTROSE 2-4 GM/100ML-% IV SOLN
2.0000 g | INTRAVENOUS | Status: AC
  Administered 2024-05-04: 2 g via INTRAVENOUS
  Filled 2024-05-04: qty 100

## 2024-05-04 MED ORDER — ONDANSETRON HCL 4 MG/2ML IJ SOLN
INTRAMUSCULAR | Status: DC | PRN
  Administered 2024-05-04: 4 mg via INTRAVENOUS

## 2024-05-04 SURGICAL SUPPLY — 39 items
BAG COUNTER SPONGE SURGICOUNT (BAG) ×1 IMPLANT
BIT DRILL 4.3MMS DISTAL GRDTED (BIT) IMPLANT
BLADE SURG 15 STRL LF DISP TIS (BLADE) ×1 IMPLANT
COVER BACK TABLE 60X90IN (DRAPES) ×1 IMPLANT
COVER PERINEAL POST (MISCELLANEOUS) ×1 IMPLANT
COVER SURGICAL LIGHT HANDLE (MISCELLANEOUS) ×2 IMPLANT
DRAPE C-ARM 42X72 X-RAY (DRAPES) ×1 IMPLANT
DRAPE C-ARMOR (DRAPES) IMPLANT
DRAPE INCISE IOBAN 22X17 STRL (DRAPES) IMPLANT
DRAPE STERI IOBAN 125X83 (DRAPES) ×1 IMPLANT
DRAPE SURG ORHT 6 SPLT 77X108 (DRAPES) IMPLANT
DRESSING MEPILEX FLEX 4X4 (GAUZE/BANDAGES/DRESSINGS) ×1 IMPLANT
DRSG ADAPTIC 3X8 NADH LF (GAUZE/BANDAGES/DRESSINGS) ×1 IMPLANT
DRSG MEPILEX POST OP 4X8 (GAUZE/BANDAGES/DRESSINGS) ×1 IMPLANT
ELECTRODE REM PT RTRN 9FT ADLT (ELECTROSURGICAL) ×1 IMPLANT
EVACUATOR 1/8 PVC DRAIN (DRAIN) IMPLANT
GLOVE BIOGEL PI IND STRL 9 (GLOVE) ×1 IMPLANT
GLOVE SURG ORTHO 9.0 STRL STRW (GLOVE) ×1 IMPLANT
GOWN STRL REUS W/ TWL XL LVL3 (GOWN DISPOSABLE) ×3 IMPLANT
GUIDEPIN VERSANAIL DSP 3.2X444 (ORTHOPEDIC DISPOSABLE SUPPLIES) IMPLANT
GUIDEWIRE BALL NOSE 100CM (WIRE) IMPLANT
HFN LH 130 DEG 11MM X 380MM (Orthopedic Implant) IMPLANT
KIT BASIN OR (CUSTOM PROCEDURE TRAY) ×1 IMPLANT
KIT TURNOVER KIT B (KITS) ×1 IMPLANT
MANIFOLD NEPTUNE II (INSTRUMENTS) ×1 IMPLANT
PACK GENERAL/GYN (CUSTOM PROCEDURE TRAY) ×1 IMPLANT
PAD ARMBOARD POSITIONER FOAM (MISCELLANEOUS) ×2 IMPLANT
SCREW BONE CORTICAL 5.0X40 (Screw) IMPLANT
SCREW BONE CORTICAL 5.0X42 (Screw) IMPLANT
SCREW DRILL BIT ANIT ROTATION (BIT) IMPLANT
SCREW LAG HIP NAIL 10.5X95 (Screw) IMPLANT
SOLN 0.9% NACL 1000 ML (IV SOLUTION) ×1 IMPLANT
SOLN 0.9% NACL POUR BTL 1000ML (IV SOLUTION) ×1 IMPLANT
SOLN STERILE WATER 1000 ML (IV SOLUTION) ×2 IMPLANT
SOLN STERILE WATER BTL 1000 ML (IV SOLUTION) ×2 IMPLANT
STAPLER SKIN PROX 35W (STAPLE) IMPLANT
SUT ETHILON 2 0 PSLX (SUTURE) IMPLANT
SUT VIC AB 0 CT1 27XBRD ANBCTR (SUTURE) IMPLANT
SUT VIC AB 2-0 CTB1 (SUTURE) IMPLANT

## 2024-05-04 NOTE — Progress Notes (Signed)
 PHARMACY - ANTICOAGULATION CONSULT NOTE  Pharmacy Consult for warfarin Indication: atrial fibrillation  Allergies  Allergen Reactions   Ace Inhibitors Swelling   Lisinopril      Seizures    Ramipril      Other Reaction(s): gum swelling   Rosuvastatin Other (See Comments)    Muscle aches    Patient Measurements: Height: 5' 4 (162.6 cm) Weight: 66.7 kg (147 lb) IBW/kg (Calculated) : 59.2 HEPARIN  DW (KG): 66.7  Vital Signs: Temp: 97.9 F (36.6 C) (10/06 1153) Temp Source: Oral (10/06 0748) BP: 111/55 (10/06 1213) Pulse Rate: 117 (10/06 1213)  Labs: Recent Labs    05/02/24 1832 05/02/24 1905 05/03/24 0505 05/03/24 0555 05/03/24 1655 05/04/24 0400  HGB 12.9* 13.6 11.4*  --   --  10.1*  HCT 39.6 40.0 35.7*  --   --  30.4*  PLT 216  --  168  --   --  122*  LABPROT 33.9*  --   --  41.9* 19.6* 15.2  INR 3.2*  --   --  4.2* 1.6* 1.1  CREATININE 1.08 1.10 1.18  --  0.95 1.04    Estimated Creatinine Clearance: 49 mL/min (by C-G formula based on SCr of 1.04 mg/dL).   Medical History: Past Medical History:  Diagnosis Date   Acute and subacute bacterial endocarditis 1999   group G Streptococcus   Atrial fibrillation (HCC) 11/11/2008   Qualifier: Diagnosis of  By: Morris, MD, CODY Debby Lenis    CARDIOMYOPATHY 10/20/2008   Qualifier: Diagnosis of  By: Delores, RN, BSN, Lauren     Cerebrovascular disease, unspecified    Closed right acetabular fracture (HCC)    COPD (chronic obstructive pulmonary disease) (HCC) 1999   Epidural hematoma (HCC) 03/01/2011   Recent fall 2012    Esophageal reflux    Generalized osteoarthrosis, unspecified site    History of tobacco abuse    Hypertension    HYPERTENSION, BENIGN 10/20/2008   Qualifier: Diagnosis of  By: Delores, RN, BSN, Lauren     Hyponatremia    Insomnia    Iron deficiency anemia, unspecified    ISCHEMIC COLITIS, HX OF 10/20/2008   Qualifier: Diagnosis of  By: Delores, RN, BSN, Lauren     Lung nodules 2008, 2014   LLL  nodule removed 2008 with LLL lobectomy,  RLL nodule 3mm observation   Prostate cancer (HCC) 2015   Protruding sternal wires    S/P mitral valve replacement 02/01/1998   Carpentier-Edwards porcine bioprosthetic tissue valve, size 31mm Placed for acute and subacute bacterial endocarditis (group G Streptococcus) with pre-existing mitral valve prolapse    S/P mitral valve replacement with bioprosthetic valve 03/24/2013   Redo mitral valve replacement using 29mm Edwards [employer] mitral bovine bioprosthetic tissue valve performed via right mini thoracotomy   Seizures (HCC)    last seizure 1 year ago   Severe mitral regurgitation 02/20/2013   Unsteady gait     Assessment: 78 yo male on chronic warfarin for afib w/ hx bMVR x 2.  Warfarin was held and INR reversed upon admission for repair of hip fracture.    Pharmacy asked to resume warfarin today.  INR down to 1.1.  PTA warfarin dosing was 3 mg daily with 6 mg on Sundays.   Goal of Therapy:  INR 2-3 Monitor platelets by anticoagulation protocol: Yes   Plan:  Start Coumadin  with 6 mg x 1 tonight Daily INR.  Harlene Barlow, [employer], BCCP Clinical Pharmacist  05/04/2024 1:37 PM   [facility] pharmacy  phone numbers are listed on [email or web address]

## 2024-05-04 NOTE — Op Note (Signed)
 05/04/2024  PATIENT:  Jeffery Copeland  Dec 25, 1945 male   MEDICAL RECORD NUMBER: [id number]  PRE-OPERATIVE DIAGNOSIS:  LEFT HIP FOUR PART PERITROCHANTERIC HIP FRACTURE  POST-OPERATIVE DIAGNOSIS:  LEFT HIP FOUR PART PERITROCHANTERIC HIP FRACTURE  PROCEDURE:  INTRAMEDULLARY NAILING OF THE LEFT HIP using a statically locked Biomet Affixus nail 11 X 380.  SURGEON:  Ozell DEL. Celena, M.D.  ASSISTANT:  Francis Mt, PA-C.  ANESTHESIA:  General.  COMPLICATIONS:  None.  ESTIMATED BLOOD LOSS:  Less than 150 mL.  DISPOSITION:  To PACU.  CONDITION:  Stable.  DELAY START OF DVT PROPHYLAXIS BECAUSE OF BLEEDING RISK: NO  BRIEF SUMMARY AND INDICATION OF PROCEDURE:  Jeffery Copeland is a 78 y.o. year- old with multiple medical problems.  I discussed with the patient risks and benefits of surgical treatment including the potential for malunion, nonunion, symptomatic hardware, heart attack, stroke, neurovascular injury, bleeding, and others.  After acknowledging these risks, consent was provided consent to proceed.  BRIEF SUMMARY OF PROCEDURE:  The patient was taken to the operating room where general anesthesia was induced.  He was positioned supine on the Hana fracture table.  A closed reduction maneuver was performed of the fractured proximal femur and this was confirmed on both AP and lateral xray views.  Comminution prevented anatomic reduction.  A thorough scrub and wash with chlorhexidine  and then Betadine scrub and paint was performed.  After sterile drapes and time-out, a long instrument was used to identify the appropriate starting position under C-arm on both AP and lateral images.  A 3 cm incision was made proximal to the greater trochanter.  The curved cannulated awl was inserted just medial to the tip of the lateral trochanter and then the starting guidewire advanced into the proximal femur.  This was checked on AP and lateral views.  The starting reamer was engaged with the soft tissue  protected by a sleeve.  The curved ball-tipped guidewire was then inserted, making sure it was just posterior as possible in the distal femur and across the fracture site, which stayed in a reduced position.  It was sequentially reamed up to 13 mm and an 11 x 380 mm nail inserted to the appropriate depth.  The guidewire for the lag screw was then inserted with the appropriate anteversion to make sure it was in a center-center position.  An additional guide pin to prevent rotation was placed proximal to this because we were working on a left hip.  This was measured and the lag screw placed with excellent purchase and position checked on both views.  The set screw was then engaged within the groove of the lag screw, which was allowed to telescope.  Traction was released and compression achieved with the compression device over the lag screw.  After achieving compression we then proceeded to terminally tightened the setscrew to convert this into a fixed angle device given the considerable comminution and diameter of the canal.  This was followed by placement of two distal locking screws using perfect circle technique.  This was confirmed on AP and lateral images. Wounds were irrigated thoroughly, closed in a standard layered fashion. Sterile gently compressive dressings were applied.  Francis Mt, PA-C, assisted throughout.  The patient was awakened from anesthesia and transported to the PACU in stable condition.  PROGNOSIS:  The patient will be weightbearing as tolerated with physical therapy beginning DVT prophylaxis as soon as deemed stable by the Primary Care Service.  He has no range of motion precautions.  We will continue to follow through at the hospital.  Anticipate follow up in the office in 2 weeks for removal of sutures and further evaluation.  Risks of failure are elevated in this case because of the severity of comminution and the markedly impaired bone quality observed during the  procedure.     Ozell DEL. Celena, M.D.

## 2024-05-04 NOTE — Plan of Care (Signed)

## 2024-05-04 NOTE — Progress Notes (Signed)
 [facility] Progress Note Patient: Jeffery Copeland FMW:[id number] DOB: 16-May-1946  DOA: 05/02/2024 DOS: the patient was seen and examined on 05/04/2024  Brief Hospital Course: Patient with PMH of COPD, paroxysmal A-fib, on warfarin, seizures, bioprosthetic MVR x 2, HLD, chronic hypoxic respiratory failure on 2 LPM. Present to the hospital with a mechanical fall. Reports that he was walking and tripped over and had a fall.  Denies having any head injury or neck injury. Found to have a left femoral neck fracture. Orthopedic surgery was consulted. Patient was altered and scheduled for surgery on 10/5.  Now surgeries for 10/6 due to elevated INR.  Assessment and Plan: Left hip fracture. Mechanical fall. Denies any head injury or neck injury. Orthopedic surgery consulted. Currently scheduled for intramedullary rod of on 10/6. Per orthopedic surgery goal INR less than 2 for surgery. Patient received vitamin K 2 mg IV for this. Underwent surgery and tolerated it well. Postop recovery.  Preoperative medical evaluation Charlanne dunker al. Estimated Risk Probability for Perioperative Myocardial Infarction or Cardiac Arrest: 0.8 % Pt can climb a flight of stair, ambulates around house using any device, without any shortness of breath or chest pain. Has h/o severe COPD on 2 LPM without exacerbation Chest X ray concerning for significant pneumonia Echocardiogram ordered Based on RCRI 30-day risk of death, MI, or cardiac arrest is 0.5 %  Long discussion with son and patient at bedside, they understands patient will be at moderate to high risk for the surgical procedure. Alternative surgical intervention were discussed by the surgery team. Family accepts all risks and wants to proceed with surgical intervention.   Paroxysmal A-fib. History of bioprosthetic MVR x 2. On Coumadin . INR supratherapeutic. Received vitamin K 10 mg IV. Repeat INR is 2. Monitor daily INR for the patient Currently  RVR. Initiate Cardizem drip.  History of seizure On Keppra  and Dilantin . No recent seizures witnessed by family.  BPH. On Flomax  Continue PEG  HLD. Zetia .  Leukocytosis. Likely stress demarcation. Will monitor.  History of COPD. Continue nebulizer therapy.  Continue Mucinex. There is concern for potential development of respiratory illness.  Will monitor clinically for now.  No evidence of exacerbation right now on exam.   Subjective: Seen after the surgery.  Has some cough.  No nausea no vomiting.  Reports that the cough is unchanged.  No chest pain.  Pain well-controlled.  Physical Exam: Upper airway crackles. S1-S2 present No wheezing. Bowel sound present No edema.  Data Reviewed: I have Reviewed nursing notes, Vitals, and Lab results. Since last encounter, pertinent lab results CBC and BMP   . I have ordered test including CBC and BMP  .  Repeat chest x-ray tomorrow.  Disposition: Status is: Inpatient Remains inpatient appropriate because: Monitor for improvement in respiratory status  heparin  injection 5,000 Units Start: 05/04/24 2200 SCDs Start: 05/04/24 1208 SCDs Start: 05/02/24 2007   Family Communication: No one at bedside Level of care: Progressive   Vitals:   05/04/24 1153 05/04/24 1213 05/04/24 1700 05/04/24 1929  BP: 111/72 (!) 111/55 99/84 (!) 111/58  Pulse: (!) 106 (!) 117 (!) 105 97  Resp: 18 16 16 18   Temp: 97.9 F (36.6 C)  98.5 F (36.9 C) 98.7 F (37.1 C)  TempSrc:   Oral Oral  SpO2: 94% 94% 93% 97%  Weight:      Height:         Author: Yetta Blanch, MD 05/04/2024 7:58 PM  Please look on [email or web address] to find out who is  on call.

## 2024-05-04 NOTE — Progress Notes (Signed)
Pt arrived from ...PACU.., A/ox ..4.pt denies any pain, MD aware,CCMD called. CHG bath given,no further needs at this time    

## 2024-05-04 NOTE — Anesthesia Procedure Notes (Signed)
 Procedure Name: Intubation Date/Time: 05/04/2024 9:40 AM  Performed by: Myrna Homer, CRNAPre-anesthesia Checklist: Patient identified, Emergency Drugs available, Suction available and Patient being monitored Patient Re-evaluated:Patient Re-evaluated prior to induction Oxygen  Delivery Method: Circle System Utilized Preoxygenation: Pre-oxygenation with 100% oxygen  Induction Type: IV induction Ventilation: Mask ventilation without difficulty Laryngoscope Size: Glidescope and 3 Grade View: Grade I Tube type: Oral Tube size: 7.5 mm Number of attempts: 1 Airway Equipment and Method: Stylet and Oral airway Placement Confirmation: ETT inserted through vocal cords under direct vision, positive ETCO2 and breath sounds checked- equal and bilateral Secured at: 22 cm Tube secured with: Tape Dental Injury: Teeth and Oropharynx as per pre-operative assessment

## 2024-05-04 NOTE — Transfer of Care (Signed)
 Immediate Anesthesia Transfer of Care Note  Patient: Jeffery Copeland  Procedure(s) Performed: INSERTION, INTRAMEDULLARY ROD, FEMUR (Left)  Patient Location: PACU  Anesthesia Type:General  Level of Consciousness: drowsy, patient cooperative, confused, and responds to stimulation  Airway & Oxygen  Therapy: Patient Spontanous Breathing and Patient connected to face mask oxygen   Post-op Assessment: Report given to RN and Post -op Vital signs reviewed and stable  Post vital signs: Reviewed and stable  Last Vitals:  Vitals Value Taken Time  BP 126/68 05/04/24 11:23  Temp 36.6 C 05/04/24 11:23  Pulse 106 05/04/24 11:24  Resp 31 05/04/24 11:24  SpO2 100 % 05/04/24 11:24  Vitals shown include unfiled device data.  Last Pain:  Vitals:   05/04/24 0839  TempSrc:   PainSc: 9          Complications: No notable events documented.

## 2024-05-04 NOTE — Progress Notes (Signed)
  Echocardiogram 2D Echocardiogram has been attempted. Spoke to nurse to see when surgery was to pre-op echo in time before he went for surgery. Patient was getting picked up by surgery transportation as we were speaking. Will attempt echo after surgery if still needed?  Tinnie FORBES Gosling 05/04/2024, 8:09 AM

## 2024-05-04 NOTE — Progress Notes (Signed)
 I have personally reviewed and discussed in detail with Dr. Marcey Parker the patient's presentation, progress, and confirmed the examination findings; I have also reviewed and interpreted the x-rays and laboratory studies; and I concur with the plan for treatment which is outlined above by Dr. Parker. Because of scheduling he has requested that I proceed with treatment.  The risks and benefits of left hip repair were discussed with the patient, including the possibility of infection, nerve injury, vessel injury, wound breakdown, arthritis, symptomatic hardware, DVT/ PE, loss of motion, malunion, nonunion, and need for further surgery among others.   These risks were acknowledged and consent was provided to proceed.  Jeffery Bruch, MD [facility], [facility] [phone]

## 2024-05-04 NOTE — Progress Notes (Signed)
 PT Cancellation Note  Patient Details Name: TENNYSON WACHA MRN: [id number] DOB: [birth year]   Cancelled Treatment:    Reason Eval/Treat Not Completed: Medical issues which prohibited therapy (Pt had hip surgery today. Will evaluate tomorrow.)   Stephane JULIANNA Bevel 05/04/2024, 1:06 PM Denasia Venn M,PT Acute Rehab Services [phone]

## 2024-05-04 NOTE — Anesthesia Postprocedure Evaluation (Signed)
 Anesthesia Post Note  Patient: Jeffery Copeland  Procedure(s) Performed: INSERTION, INTRAMEDULLARY ROD, FEMUR (Left)     Patient location during evaluation: PACU Anesthesia Type: General Level of consciousness: awake and alert Pain management: pain level controlled Vital Signs Assessment: post-procedure vital signs reviewed and stable Respiratory status: spontaneous breathing, nonlabored ventilation, respiratory function stable and patient connected to nasal cannula oxygen  Cardiovascular status: blood pressure returned to baseline and stable Postop Assessment: no apparent nausea or vomiting Anesthetic complications: no   No notable events documented.  Last Vitals:  Vitals:   05/04/24 1700 05/04/24 1929  BP: 99/84 (!) 111/58  Pulse: (!) 105 97  Resp: 16 18  Temp: 36.9 C 37.1 C  SpO2: 93% 97%    Last Pain:  Vitals:   05/04/24 1929  TempSrc: Oral  PainSc:                  Lanier Millon

## 2024-05-05 ENCOUNTER — Inpatient Hospital Stay (HOSPITAL_COMMUNITY)

## 2024-05-05 DIAGNOSIS — R918 Other nonspecific abnormal finding of lung field: Secondary | ICD-10-CM | POA: Diagnosis not present

## 2024-05-05 DIAGNOSIS — J984 Other disorders of lung: Secondary | ICD-10-CM | POA: Diagnosis not present

## 2024-05-05 DIAGNOSIS — S72002A Fracture of unspecified part of neck of left femur, initial encounter for closed fracture: Secondary | ICD-10-CM | POA: Diagnosis not present

## 2024-05-05 DIAGNOSIS — I700 Atherosclerosis of aorta: Secondary | ICD-10-CM | POA: Diagnosis not present

## 2024-05-05 DIAGNOSIS — R0602 Shortness of breath: Secondary | ICD-10-CM | POA: Diagnosis not present

## 2024-05-05 LAB — CBC
HCT: 26.6 % — ABNORMAL LOW (ref 39.0–52.0)
Hemoglobin: 8.9 g/dL — ABNORMAL LOW (ref 13.0–17.0)
MCH: 32.8 pg (ref 26.0–34.0)
MCHC: 33.5 g/dL (ref 30.0–36.0)
MCV: 98.2 fL (ref 80.0–100.0)
Platelets: 116 K/uL — ABNORMAL LOW (ref 150–400)
RBC: 2.71 MIL/uL — ABNORMAL LOW (ref 4.22–5.81)
RDW: 12.9 % (ref 11.5–15.5)
WBC: 11.4 K/uL — ABNORMAL HIGH (ref 4.0–10.5)
nRBC: 0 % (ref 0.0–0.2)

## 2024-05-05 LAB — PROTIME-INR
INR: 1.1 (ref 0.8–1.2)
Prothrombin Time: 15 s (ref 11.4–15.2)

## 2024-05-05 LAB — BASIC METABOLIC PANEL WITH GFR
Anion gap: 6 (ref 5–15)
BUN: 10 mg/dL (ref 8–23)
CO2: 27 mmol/L (ref 22–32)
Calcium: 7.6 mg/dL — ABNORMAL LOW (ref 8.9–10.3)
Chloride: 101 mmol/L (ref 98–111)
Creatinine, Ser: 0.77 mg/dL (ref 0.61–1.24)
GFR, Estimated: >60 mL/min (ref >60)
Glucose, Bld: 113 mg/dL — ABNORMAL HIGH (ref 70–99)
Potassium: 4.2 mmol/L (ref 3.5–5.1)
Sodium: 134 mmol/L — ABNORMAL LOW (ref 135–145)

## 2024-05-05 LAB — MAGNESIUM: Magnesium: 1.7 mg/dL (ref 1.7–2.4)

## 2024-05-05 LAB — VITAMIN D 25 HYDROXY (VIT D DEFICIENCY, FRACTURES): Vit D, 25-Hydroxy: 27.67 ng/mL — ABNORMAL LOW (ref 30–100)

## 2024-05-05 MED ORDER — VITAMIN D 25 MCG (1000 UNIT) PO TABS
2000.0000 [IU] | ORAL_TABLET | Freq: Two times a day (BID) | ORAL | Status: DC
  Administered 2024-05-05 – 2024-05-06 (×3): 2000 [IU] via ORAL
  Filled 2024-05-05 (×3): qty 2

## 2024-05-05 MED ORDER — WARFARIN SODIUM 6 MG PO TABS
6.0000 mg | ORAL_TABLET | Freq: Once | ORAL | Status: AC
  Administered 2024-05-05: 6 mg via ORAL
  Filled 2024-05-05: qty 1

## 2024-05-05 MED ORDER — LEVETIRACETAM 500 MG PO TABS
500.0000 mg | ORAL_TABLET | Freq: Two times a day (BID) | ORAL | Status: DC
  Administered 2024-05-05 – 2024-05-06 (×3): 500 mg via ORAL
  Filled 2024-05-05 (×3): qty 1

## 2024-05-05 MED ORDER — VITAMIN C 500 MG PO TABS
1000.0000 mg | ORAL_TABLET | Freq: Every day | ORAL | Status: DC
  Administered 2024-05-05 – 2024-05-06 (×2): 1000 mg via ORAL
  Filled 2024-05-05 (×2): qty 2

## 2024-05-05 NOTE — Evaluation (Signed)
 Physical Therapy Evaluation Patient Details Name: Jeffery Copeland MRN: [id number] DOB: 11-Jul-1946 Today's Date: 05/05/2024  History of Present Illness  Pt is a 78 yr old male who presented 05/02/24 due to L hip pain due to a fall. Pt s/p 05/04/24 IM nailing of L hip and WBAT. PMH: chronic hypoxic respiratory failure (2L at baseline), paroxysmal atrial fibrillation/flutter, seizures, bioprosthetic mitral valve replacement with redo in August 2014, hyperlipidemia  Clinical Impression  Pt is currently presenting at [facility] A +2 for bed mobility, Min A +2 for sit to stand from elevated EOB, Mod A +2 for step pivot transfer with RW. Pt requires multi modal cues for step progression and AD navigation. Pt has some assistance at home but not enough to support him at current level of functioning. Due to pt current functional status, home set up and available assistance at home recommending skilled physical therapy services < 3 hours/day in order to address strength, balance and functional mobility to decrease risk for falls, injury, immobility, skin break down and re-hospitalization.        If plan is discharge home, recommend the following: Assistance with cooking/housework;Assist for transportation   Can travel by private vehicle   No    Equipment Recommendations Wheelchair (measurements PT);Wheelchair cushion (measurements PT);BSC/3in1     Functional Status Assessment Patient has had a recent decline in their functional status and demonstrates the ability to make significant improvements in function in a reasonable and predictable amount of time.     Precautions / Restrictions Precautions Precautions: Fall Recall of Precautions/Restrictions: Intact Restrictions Weight Bearing Restrictions Per Provider Order: Yes LLE Weight Bearing Per Provider Order: Weight bearing as tolerated      Mobility  Bed Mobility Overal bed mobility: Needs Assistance Bed Mobility: Supine to Sit     Supine to sit: Max  assist, +2 for physical assistance, +2 for safety/equipment     General bed mobility comments: Max A at LE and trunk due to pt fear of pain with movmeent. Attempted moving bil LE toward EOB with significant increase in time and Mod A at the LLE. Pt agreeable to helicopter movement with therapist at LE nd second person at trunk. Pt reports less pain than he was concerned about once in sitting.    Transfers Overall transfer level: Needs assistance Equipment used: Rolling walker (2 wheels) Transfers: Sit to/from Stand, Bed to chair/wheelchair/BSC Sit to Stand: Min assist, +2 physical assistance, +2 safety/equipment, From elevated surface   Step pivot transfers: Mod assist, +2 physical assistance, +2 safety/equipment       General transfer comment: Min A +2 for sit to stand from elevated EOB with verbal cues for hand placement, Mod A +2 for step pivot transfer with step by step directions for navigating AD and foot placement/progressiong to get to recliner from EOB.    Ambulation/Gait     Pre-gait activities: steps from EOB to recliner with 2 person Mod A with RW for stability and step by step directions for progressing LE coordinating with AD       Balance Overall balance assessment: Needs assistance Sitting-balance support: Bilateral upper extremity supported, Feet supported Sitting balance-Leahy Scale: Fair     Standing balance support: Bilateral upper extremity supported, During functional activity, Reliant on assistive device for balance Standing balance-Leahy Scale: Poor Standing balance comment: 2 person Min A for sit to stand and MOd A for transfer.       Pertinent Vitals/Pain Pain Assessment Pain Assessment: Faces Faces Pain Scale: Hurts even  more Pain Location: L hip Pain Descriptors / Indicators: Aching, Grimacing, Guarding Pain Intervention(s): Monitored during session, Limited activity within patient's tolerance, Premedicated before session    Home Living  Family/patient expects to be discharged to:: Private residence Living Arrangements: Alone   Type of Home: Apartment Home Access: Level entry       Home Layout: One level Home Equipment: Agricultural consultant (2 wheels);Rollator (4 wheels);Tub bench      Prior Function Prior Level of Function : Independent/Modified Independent             Mobility Comments: 4ww vs RW ADLs Comments: mod Indep     Extremity/Trunk Assessment   Upper Extremity Assessment Upper Extremity Assessment: Defer to OT evaluation    Lower Extremity Assessment Lower Extremity Assessment: LLE deficits/detail;Generalized weakness LLE Deficits / Details: recent IMN Of the L hip LLE: Unable to fully assess due to pain    Cervical / Trunk Assessment Cervical / Trunk Assessment: Kyphotic  Communication   Communication Communication: Impaired Factors Affecting Communication: Hearing impaired    Cognition Arousal: Alert Behavior During Therapy: WFL for tasks assessed/performed   PT - Cognitive impairments: No apparent impairments     Following commands: Intact       Cueing Cueing Techniques: Verbal cues     General Comments General comments (skin integrity, edema, etc.): On room air pt O2 sats dropping to 74% with activity. ~2 min back up to 88% on 2L O2 via [place]        Assessment/Plan    PT Assessment Patient needs continued PT services  PT Problem List Decreased strength;Decreased range of motion;Decreased activity tolerance;Decreased balance;Decreased mobility;Decreased safety awareness;Pain       PT Treatment Interventions DME instruction;Balance training;Gait training;Neuromuscular re-education;Functional mobility training;Therapeutic activities;Therapeutic exercise    PT Goals (Current goals can be found in the Care Plan section)  Acute Rehab PT Goals Patient Stated Goal: to get stronger and improve pain to get back to fishing in the pond PT Goal Formulation: With patient Time For Goal  Achievement: 05/19/24 Potential to Achieve Goals: Good    Frequency Min 3X/week        AM-PAC PT 6 Clicks Mobility  Outcome Measure Help needed turning from your back to your side while in a flat bed without using bedrails?: Total Help needed moving from lying on your back to sitting on the side of a flat bed without using bedrails?: Total Help needed moving to and from a bed to a chair (including a wheelchair)?: A Lot Help needed standing up from a chair using your arms (e.g., wheelchair or bedside chair)?: A Lot Help needed to walk in hospital room?: Total Help needed climbing 3-5 steps with a railing? : Total 6 Click Score: 8    End of Session Equipment Utilized During Treatment: Gait belt Activity Tolerance: Patient tolerated treatment well;Patient limited by pain Patient left: in chair;with call bell/phone within reach;with chair alarm set Nurse Communication: Mobility status;Other (comment) (RN notified pt PIV on R hand bleeding) PT Visit Diagnosis: Unsteadiness on feet (R26.81);Other abnormalities of gait and mobility (R26.89);Pain;Muscle weakness (generalized) (M62.81) Pain - Right/Left: Left Pain - part of body: Hip    Time: 8890-8865 PT Time Calculation (min) (ACUTE ONLY): 25 min   Charges:   PT Evaluation $PT Eval Low Complexity: 1 Low PT Treatments $Therapeutic Activity: 8-22 mins PT General Charges $$ ACUTE PT VISIT: 1 Visit         Dorothyann Maier, DPT, CLT  Acute Rehabilitation Services Office:  [phone] (Secure chat preferred)   Dorothyann VEAR Maier 05/05/2024, 11:58 AM

## 2024-05-05 NOTE — Progress Notes (Addendum)
 Orthopaedic Trauma Service Progress Note  Patient ID: NICOLAI LABONTE MRN: [id number] DOB/AGE: 1946-06-04 78 y.o.  Subjective:  Some pain but doing ok  Hard of hearing   No new issues  Lives by himself and uses walker at baseline    ROS As above  Today's  total administered Morphine  Milligram Equivalents: 7.5 Yesterday's total administered Morphine  Milligram Equivalents: 75  Objective:   VITALS:   Vitals:   05/04/24 2329 05/05/24 0410 05/05/24 0554 05/05/24 0720  BP: 130/62 125/67  (!) 128/54  Pulse: 68 64  (!) 55  Resp: 18 18  18   Temp: 98.4 F (36.9 C) 98.4 F (36.9 C)  98.4 F (36.9 C)  TempSrc: Oral Oral  Oral  SpO2: 99% 98%  99%  Weight:   60 kg   Height:        Estimated body mass index is 22.71 kg/m as calculated from the following:   Height as of this encounter: 5' 4 (1.626 m).   Weight as of this encounter: 60 kg.   Intake/Output      10/06 0701 10/07 0700 10/07 0701 10/08 0700   P.O.  120   I.V. (mL/kg) 733.5 (12.2)    IV Piggyback 210    Total Intake(mL/kg) 943.5 (15.7) 120 (2)   Urine (mL/kg/hr) 500 (0.3)    Blood 125    Total Output 625    Net +318.5 +120          LABS  Results for orders placed or performed during the hospital encounter of 05/02/24 (from the past 24 hours)  Basic metabolic panel with GFR     Status: Abnormal   Collection Time: 05/05/24  3:47 AM  Result Value Ref Range   Sodium 134 (L) 135 - 145 mmol/L   Potassium 4.2 3.5 - 5.1 mmol/L   Chloride 101 98 - 111 mmol/L   CO2 27 22 - 32 mmol/L   Glucose, Bld 113 (H) 70 - 99 mg/dL   BUN 10 8 - 23 mg/dL   Creatinine, Ser 9.22 0.61 - 1.24 mg/dL   Calcium  7.6 (L) 8.9 - 10.3 mg/dL   GFR, Estimated >39 >39 mL/min   Anion gap 6 5 - 15  CBC     Status: Abnormal   Collection Time: 05/05/24  3:47 AM  Result Value Ref Range   WBC 11.4 (H) 4.0 - 10.5 K/uL   RBC 2.71 (L) 4.22 - 5.81 MIL/uL   Hemoglobin  8.9 (L) 13.0 - 17.0 g/dL   HCT 73.3 (L) 60.9 - 47.9 %   MCV 98.2 80.0 - 100.0 fL   MCH 32.8 26.0 - 34.0 pg   MCHC 33.5 30.0 - 36.0 g/dL   RDW 87.0 88.4 - 84.4 %   Platelets 116 (L) 150 - 400 K/uL   nRBC 0.0 0.0 - 0.2 %  Magnesium      Status: None   Collection Time: 05/05/24  3:47 AM  Result Value Ref Range   Magnesium  1.7 1.7 - 2.4 mg/dL  Protime-INR     Status: None   Collection Time: 05/05/24  3:47 AM  Result Value Ref Range   Prothrombin Time 15.0 11.4 - 15.2 seconds   INR 1.1 0.8 - 1.2  VITAMIN D  25 Hydroxy (Vit-D Deficiency, Fractures)     Status: Abnormal  Collection Time: 05/05/24  3:47 AM  Result Value Ref Range   Vit D, 25-Hydroxy 27.67 (L) 30 - 100 ng/mL   *Note: Due to a large number of results and/or encounters for the requested time period, some results have not been displayed. A complete set of results can be found in Results Review.     PHYSICAL EXAM:   Gen: resting comfortably in bed, NAD, looks good  Lungs: unlabored Ext:       Left Lower Extremity Dressing is clean, dry and intact to left hip and thigh   Extremity is warm  No DCT  Compartments are soft  No pain out of proportion with passive stretching of toes or ankle  DPN, SPN, TN sensory functions are intact  EHL, FHL, lesser toe motor functions intact  Ankle flexion, extension, inversion eversion intact  + DP pulse    Assessment/Plan: 1 Day Post-Op   Principal Problem:   Closed left hip fracture (HCC) Active Problems:   Acute hip pain, left   Fall at home, initial encounter   Leukocytosis   Chronic hypoxic respiratory failure (HCC)   History of COPD   History of seizures   HLD (hyperlipidemia)   Anti-infectives (From admission, onward)    Start     Dose/Rate Route Frequency Ordered Stop   05/04/24 1600  ceFAZolin  (ANCEF ) IVPB 2g/100 mL premix        2 g 200 mL/hr over 30 Minutes Intravenous Every 6 hours 05/04/24 1207 05/04/24 2331   05/04/24 0745  ceFAZolin  (ANCEF ) IVPB 2g/100  mL premix        2 g 200 mL/hr over 30 Minutes Intravenous On call to O.R. 05/04/24 9351 05/04/24 0942     .  POD/HD#:   78 year old male s/p fall with  left four-part peritrochanteric hip fracture s/p intramedullary nailing   Weightbearing: WBAT LLE with assistance (walker) ROM: Unrestricted range of motion of left hip and knee Insicional and dressing care: Daily dressing changes with 4 x 4 gauze and tape or Mepilex starting on 05/06/2024 Pain management: Multimodal, minimize narcotics  ID: Perioperative antibiotics Hemodynamics/ABL anemia: Acute blood loss anemia noted, monitor, asymptomatic currently.  CBC in the morning  Impediments to Fracture Healing: Vitamin D  insufficiency, fragility fracture Bone Health/Optimization: Vitamin D3 5000 IUs daily  Orthopedic device(s): Walker  Showering: Okay to shower and then clean wounds with soap and water once there is no drainage  VTE prophylaxis: Resume home anticoagulation with Coumadin .  Currently on bridge with SQ heparin   Dispo: Therapy evaluations, TOC consult for SNF  Follow - up plan: 2 weeks with orthopedic trauma service   Francis MICAEL Mt, PA-C [phone] (C) 05/05/2024, 10:34 AM  [facility] [address] [place] [address] [phone] [phone] (F)    After 5pm and on the weekends please log on to Amion, go to orthopaedics and the look under the [facility] Call for the provider(s) on call. You can also call our office at [phone] and then follow the prompts to be connected to the call team.  Patient ID: Marinda JINNY Hutchinson, male   DOB: 1946-02-05, 78 y.o.   MRN: [id number]

## 2024-05-05 NOTE — [place] FL2 (Signed)
 [place]  MEDICAID FL2 LEVEL OF CARE FORM     IDENTIFICATION  Patient Name: Jeffery Copeland Birthdate: 1945/08/03 Sex: male Admission Date (Current Location): 05/02/2024  [place] and [place] Number:  [place] [id number] O Facility and Address:  [facility], [address] [place], [place] [address]      Provider Number: [id number]  Attending Physician Name and Address:  Tobie Yetta HERO, MD  Relative Name and Phone Number:  Whalen, Trompeter [phone]    Current Level of Care: Hospital Recommended Level of Care: Skilled Nursing Facility Prior Approval Number:    Date Approved/Denied:   PASRR Number: [id number]  Discharge Plan: SNF    Current Diagnoses: Patient Active Problem List   Diagnosis Date Noted   Closed left hip fracture (HCC) 05/02/2024   Acute hip pain, left 05/02/2024   Fall at home, initial encounter 05/02/2024   Leukocytosis 05/02/2024   Chronic hypoxic respiratory failure (HCC) 05/02/2024   History of COPD 05/02/2024   History of seizures 05/02/2024   HLD (hyperlipidemia) 05/02/2024   Protruding sternal wires 07/10/2018   SOB (shortness of breath)    Seizures (HCC)    Urinary retention    Acetabular fracture, right, closed, initial encounter 10/24/2015   Malignant neoplasm of prostate (HCC) 03/14/2015   Ventral hernia without obstruction or gangrene 03/09/2014   Umbilical hernia 03/09/2014   Encounter for therapeutic drug monitoring 08/20/2013   Chronic diastolic CHF (congestive heart failure) (HCC) 07/31/2013   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 03/24/2013   Pulmonary nodule, right 09/29/2012   Dyspnea 09/26/2012   Long term (current) use of anticoagulants 10/19/2010   CAROTID ARTERY STENOSIS 05/26/2009   ATRIAL FIBRILLATION 11/11/2008   Atrial flutter (HCC) 11/11/2008   ANEMIA, IRON DEFICIENCY 10/20/2008   HYPERTENSION, BENIGN 10/20/2008   CARDIOMYOPATHY 10/20/2008    CEREBROVASCULAR DISEASE 10/20/2008   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   GERD 10/20/2008   DEGENERATIVE JOINT DISEASE, GENERALIZED 10/20/2008   ISCHEMIC COLITIS, HX OF 10/20/2008   MITRAL VALVE REPLACEMENT, HX OF 10/20/2008   TOBACCO ABUSE, HX OF 10/20/2008   S/P mitral valve replacement 02/01/1998    Orientation RESPIRATION BLADDER Height & Weight     Self, Time, Situation, Place  O2   Weight: 132 lb 4.4 oz (60 kg) Height:  5' 4 (162.6 cm)  BEHAVIORAL SYMPTOMS/MOOD NEUROLOGICAL BOWEL NUTRITION STATUS    Convulsions/Seizures   Diet (see discharge summary)  AMBULATORY STATUS COMMUNICATION OF NEEDS Skin   Total Care Verbally Surgical wounds, Other (Comment) (ecchymosis)                       Personal Care Assistance Level of Assistance  Bathing, Feeding, Dressing Bathing Assistance: Maximum assistance Feeding assistance: Limited assistance Dressing Assistance: Maximum assistance     Functional Limitations Info  Sight, Hearing, Speech Sight Info: Adequate Hearing Info: Impaired Speech Info: Adequate    SPECIAL CARE FACTORS FREQUENCY  PT (By licensed PT), OT (By licensed OT)     PT Frequency: 5x week OT Frequency: 5x week            Contractures Contractures Info: Not present    Additional Factors Info  Code Status, Allergies Code Status Info: DNR Allergies Info: Ace Inhibitors, Lisinopril , Ramipril , Rosuvastatin           Current Medications (05/05/2024):  This is the current hospital active medication list Current Facility-Administered Medications  Medication Dose Route Frequency Provider Last Rate Last  Admin   acetaminophen  (TYLENOL ) tablet 650 mg  650 mg Oral Q6H PRN Deward Eck, PA-C       Or   acetaminophen  (TYLENOL ) suppository 650 mg  650 mg Rectal Q6H PRN Deward Eck, PA-C       acetaminophen  (TYLENOL ) tablet 500 mg  500 mg Oral Q8H Deward Eck, PA-C   500 mg at 05/05/24 0542   ascorbic acid (VITAMIN C) tablet 1,000 mg  1,000 mg Oral Daily  Deward Eck, PA-C   1,000 mg at 05/05/24 1143   cholecalciferol  (VITAMIN D3) 25 MCG (1000 UNIT) tablet 2,000 Units  2,000 Units Oral BID Deward Eck, PA-C   2,000 Units at 05/05/24 1143   docusate sodium  (COLACE) capsule 100 mg  100 mg Oral BID Deward Eck, PA-C   100 mg at 05/05/24 9096   ezetimibe  (ZETIA ) tablet 10 mg  10 mg Oral Daily Deward Eck, PA-C   10 mg at 05/05/24 9095   fentaNYL  (SUBLIMAZE ) injection 25 mcg  25 mcg Intravenous Q2H PRN Deward Eck, PA-C   25 mcg at 05/03/24 1336   guaiFENesin (MUCINEX) 12 hr tablet 600 mg  600 mg Oral BID Patel, Pranav M, MD   600 mg at 05/05/24 9095   heparin  injection 5,000 Units  5,000 Units Subcutaneous Q8H Deward Eck, PA-C   5,000 Units at 05/05/24 0541   levalbuterol  (XOPENEX ) nebulizer solution 1.25 mg  1.25 mg Nebulization Q4H PRN Deward Eck, PA-C       levETIRAcetam  (KEPPRA ) tablet 500 mg  500 mg Oral BID Patel, Pranav M, MD   500 mg at 05/05/24 9096   melatonin tablet 3 mg  3 mg Oral QHS PRN Deward Eck, PA-C       metoCLOPramide (REGLAN) tablet 5-10 mg  5-10 mg Oral Q8H PRN Deward Eck, PA-C       Or   metoCLOPramide (REGLAN) injection 5-10 mg  5-10 mg Intravenous Q8H PRN Deward Eck, PA-C       morphine  (PF) 2 MG/ML injection 0.5-1 mg  0.5-1 mg Intravenous Q2H PRN Deward Eck, PA-C       ondansetron  (ZOFRAN ) injection 4 mg  4 mg Intravenous Q6H PRN Deward Eck, PA-C       oxyCODONE  (Oxy IR/ROXICODONE ) immediate release tablet 5 mg  5 mg Oral Q4H PRN Deward Eck, PA-C   5 mg at 05/05/24 9050   phenytoin  (DILANTIN ) ER capsule 300 mg  300 mg Oral QHS Deward Eck, PA-C   300 mg at 05/04/24 2136   tamsulosin  (FLOMAX ) capsule 0.4 mg  0.4 mg Oral Daily Deward Eck, PA-C   0.4 mg at 05/05/24 9095   tiZANidine  (ZANAFLEX ) tablet 2 mg  2 mg Oral Q8H PRN Deward Eck, PA-C       warfarin (COUMADIN ) tablet 6 mg  6 mg Oral ONCE-1600 Patel, Pranav M, MD       Warfarin - Pharmacist Dosing Inpatient   Does not apply [id number] Denna Harlene BROCKS [facility]   Given at  05/04/24 1655     Discharge Medications: Please see discharge summary for a list of discharge medications.  Relevant Imaging Results:  Relevant Lab Results:   Additional Information SSN: [id number]  Bridget Cordella Simmonds, LCSW

## 2024-05-05 NOTE — Plan of Care (Signed)
  Problem: Education: Goal: Knowledge of General Education information will improve Description: Including pain rating scale, medication(s)/side effects and non-pharmacologic comfort measures 05/05/2024 0944 by Gary Michaelis, RN Outcome: Progressing 05/05/2024 0944 by Gary Michaelis, RN Outcome: Progressing   Problem: Health Behavior/Discharge Planning: Goal: Ability to manage health-related needs will improve 05/05/2024 0944 by Gary Michaelis, RN Outcome: Progressing 05/05/2024 0944 by Gary Michaelis, RN Outcome: Progressing   Problem: Clinical Measurements: Goal: Ability to maintain clinical measurements within normal limits will improve 05/05/2024 0944 by Gary Michaelis, RN Outcome: Progressing 05/05/2024 0944 by Gary Michaelis, RN Outcome: Progressing Goal: Will remain free from infection 05/05/2024 0944 by Gary Michaelis, RN Outcome: Progressing 05/05/2024 0944 by Gary Michaelis, RN Outcome: Progressing Goal: Diagnostic test results will improve 05/05/2024 0944 by Gary Michaelis, RN Outcome: Progressing 05/05/2024 0944 by Gary Michaelis, RN Outcome: Progressing Goal: Respiratory complications will improve 05/05/2024 0944 by Gary Michaelis, RN Outcome: Progressing 05/05/2024 0944 by Gary Michaelis, RN Outcome: Progressing Goal: Cardiovascular complication will be avoided Outcome: Progressing   Problem: Activity: Goal: Risk for activity intolerance will decrease Outcome: Progressing   Problem: Nutrition: Goal: Adequate nutrition will be maintained Outcome: Progressing   Problem: Coping: Goal: Level of anxiety will decrease Outcome: Progressing   Problem: Elimination: Goal: Will not experience complications related to bowel motility Outcome: Progressing Goal: Will not experience complications related to urinary retention Outcome: Progressing   Problem: Pain Managment: Goal: General experience of comfort will improve and/or be  controlled Outcome: Progressing   Problem: Safety: Goal: Ability to remain free from injury will improve Outcome: Progressing   Problem: Skin Integrity: Goal: Risk for impaired skin integrity will decrease Outcome: Progressing

## 2024-05-05 NOTE — Progress Notes (Signed)
 PHARMACY - ANTICOAGULATION CONSULT NOTE  Pharmacy Consult for warfarin Indication: atrial fibrillation  Allergies  Allergen Reactions   Ace Inhibitors Swelling   Lisinopril      Seizures    Ramipril      Other Reaction(s): gum swelling   Rosuvastatin Other (See Comments)    Muscle aches    Patient Measurements: Height: 5' 4 (162.6 cm) Weight: 60 kg (132 lb 4.4 oz) IBW/kg (Calculated) : 59.2 HEPARIN  DW (KG): 66.7  Vital Signs: Temp: 98.4 F (36.9 C) (10/07 0720) Temp Source: Oral (10/07 0720) BP: 128/54 (10/07 0720) Pulse Rate: 55 (10/07 0720)  Labs: Recent Labs    05/03/24 0505 05/03/24 0555 05/03/24 1655 05/04/24 0400 05/05/24 0347  HGB 11.4*  --   --  10.1* 8.9*  HCT 35.7*  --   --  30.4* 26.6*  PLT 168  --   --  122* 116*  LABPROT  --    < > 19.6* 15.2 15.0  INR  --    < > 1.6* 1.1 1.1  CREATININE 1.18  --  0.95 1.04 0.77   < > = values in this interval not displayed.    Estimated Creatinine Clearance: 63.7 mL/min (by C-G formula based on SCr of 0.77 mg/dL).   Assessment: 78 yo male on chronic warfarin for afib w/ hx bMVR x 2.  Warfarin was held and INR reversed with vitamin K 2mg  upon admission for repair of hip fracture. Pharmacy asked to resume warfarin. INR down at 1.1. S/p IM nail 10/6. Platelets trending down 216 > 116.  PTA warfarin dosing was 3 mg daily with 6 mg on Sundays.   Goal of Therapy:  INR 2-3 Monitor platelets by anticoagulation protocol: Yes   Plan:  Give warfarin 6 mg PO x1 dose Check INR daily while on warfarin Continue to monitor H&H and platelets   Thank you for allowing pharmacy to be a part of this patient's care.  Shelba Collier, PharmD, BCPS Clinical Pharmacist

## 2024-05-05 NOTE — Progress Notes (Signed)
 [facility] Progress Note Patient: Jeffery Copeland FMW:[id number] DOB: [birth year]  DOA: 05/02/2024 DOS: the patient was seen and examined on 05/05/2024  Brief Hospital Course: Patient with PMH of COPD, paroxysmal A-fib, on warfarin, seizures, bioprosthetic MVR x 2, HLD, chronic hypoxic respiratory failure on 2 LPM. Present to the hospital with a mechanical fall. Reports that he was walking and tripped over and had a fall.  Denies having any head injury or neck injury. Found to have a left femoral neck fracture. Orthopedic surgery was consulted. Patient was altered and scheduled for surgery on 10/5.  Now surgeries for 10/6 due to elevated INR.  Assessment and Plan: Left hip fracture. Mechanical fall. Denies any head injury or neck injury. Orthopedic surgery consulted. Currently scheduled for intramedullary rod of on 10/6. Per orthopedic surgery goal INR less than 2 for surgery. Patient received vitamin K 2 mg IV for this. Underwent surgery and tolerated it well. Monitor for postop recovery.  Preoperative medical evaluation Charlanne dunker al. Estimated Risk Probability for Perioperative Myocardial Infarction or Cardiac Arrest: 0.8 % Pt can climb a flight of stair, ambulates around house using any device, without any shortness of breath or chest pain. Has h/o severe COPD on 2 LPM without exacerbation Chest X ray concerning for significant pneumonia Echocardiogram ordered Based on RCRI 30-day risk of death, MI, or cardiac arrest is 0.5 %  Long discussion with son and patient at bedside, they understands patient will be at moderate to high risk for the surgical procedure. Alternative surgical intervention were discussed by the surgery team. Family accepts all risks and wants to proceed with surgical intervention.   Paroxysmal A-fib. History of bioprosthetic MVR x 2. On Coumadin . INR supratherapeutic. Received vitamin K 10 mg IV. Also had RVR at the time of the admission and required  Cardizem drip. Currently on oral Cardizem. Also on oral warfarin.  History of seizure On Keppra  and Dilantin . No recent seizures witnessed by family.  BPH. On Flomax  Continue PEG  HLD. Zetia .  Leukocytosis. Likely stress demarcation. Will monitor.  History of COPD. Continue nebulizer therapy.  Continue Mucinex. There is concern for potential development of respiratory illness.  Will monitor clinically for now.  No evidence of exacerbation right now on exam.   Subjective: Denies any acute complaint.  No nausea or vomiting.  Reports some shortness of breath.  Physical Exam: Negative upper airway expiratory wheezing. S1-S2 present. Bowel sound present Trace edema bilaterally.  Data Reviewed: I have Reviewed nursing notes, Vitals, and Lab results. Since last encounter, pertinent lab results CBC and BMP   . I have ordered test including CBC and BMP  .   Disposition: Status is: Inpatient Remains inpatient appropriate because: Monitor for postop recovery.  Received H&H.  heparin  injection 5,000 Units Start: 05/04/24 2200 SCDs Start: 05/04/24 1208 SCDs Start: 05/02/24 2007   Family Communication: No one at bedside Level of care: Telemetry Medical   Vitals:   05/05/24 0554 05/05/24 0720 05/05/24 1100 05/05/24 1252  BP:  (!) 128/54 (!) 104/46 112/60  Pulse:  (!) 55 (!) 57 60  Resp:  18 19 17   Temp:  98.4 F (36.9 C) 98.2 F (36.8 C) 98.4 F (36.9 C)  TempSrc:  Oral Oral   SpO2:  99% 96% 100%  Weight: 60 kg     Height:         Author: Yetta Blanch, MD 05/05/2024 6:30 PM  Please look on [email or web address] to find out who is on call.

## 2024-05-05 NOTE — TOC Initial Note (Signed)
 Transition of Care [facility]) - Initial/Assessment Note    Patient Details  Name: Jeffery Copeland MRN: [id number] Date of Birth: Apr 26, 1946  Transition of Care [facility]) CM/SW Contact:    Bridget Cordella Simmonds, LCSW Phone Number: 05/05/2024, 2:10 PM  Clinical Narrative:  CSW met with pt regarding PT recommendation for SNF.  Pt oriented x4 but very hard of hearing, able to communicate.  Pt from home alone.  Pt is agreeable to SNF, reports he was at SNF off [address] in the past [facility])  Medicare choice document provided.  Permission given to speak with son [name], sister [name].  CSW also spoke with [name], who confirms the above, including prior stay at [facility].  [name] in agreement with plan for SNF, will be by to visit pt tonight to look at options.  Referral sent out in hub for SNF.                  Expected Discharge Plan: Skilled Nursing Facility Barriers to Discharge: Continued Medical Work up, SNF Pending bed offer   Patient Goals and CMS Choice   CMS Medicare.gov Compare Post Acute Care list provided to:: Patient (to pt and son [name]) Choice offered to / list presented to : Patient, Adult Children (son [name])      Expected Discharge Plan and Services In-house Referral: Clinical Social Work   Post Acute Care Choice: Skilled Nursing Facility Living arrangements for the past 2 months: Single Family Home                                      Prior Living Arrangements/Services Living arrangements for the past 2 months: Single Family Home Lives with:: Self Patient language and need for interpreter reviewed:: Yes Do you feel safe going back to the place where you live?: Yes      Need for Family Participation in Patient Care: Yes (Comment) Care giver support system in place?: Yes (comment)   Criminal Activity/Legal Involvement Pertinent to Current Situation/Hospitalization: No - Comment as needed  Activities of Daily Living   ADL Screening (condition at time of  admission) Independently performs ADLs?: Yes (appropriate for developmental age) Is the patient deaf or have difficulty hearing?: Yes Does the patient have difficulty seeing, even when wearing glasses/contacts?: No Does the patient have difficulty concentrating, remembering, or making decisions?: No  Permission Sought/Granted Permission sought to share information with : Family Supports Permission granted to share information with : Yes, Verbal Permission Granted  Share Information with NAME: son [name], sister [name]  Permission granted to share info w AGENCY: SNF        Emotional Assessment Appearance:: Appears stated age Attitude/Demeanor/Rapport: Engaged Affect (typically observed): Appropriate, Pleasant Orientation: : Oriented to Self, Oriented to Place, Oriented to  Time, Oriented to Situation      Admission diagnosis:  Closed left hip fracture (HCC) [S72.002A] Patient Active Problem List   Diagnosis Date Noted   Closed left hip fracture (HCC) 05/02/2024   Acute hip pain, left 05/02/2024   Fall at home, initial encounter 05/02/2024   Leukocytosis 05/02/2024   Chronic hypoxic respiratory failure (HCC) 05/02/2024   History of COPD 05/02/2024   History of seizures 05/02/2024   HLD (hyperlipidemia) 05/02/2024   Protruding sternal wires 07/10/2018   SOB (shortness of breath)    Seizures (HCC)    Urinary retention    Acetabular fracture, right, closed, initial encounter 10/24/2015  Malignant neoplasm of prostate (HCC) 03/14/2015   Ventral hernia without obstruction or gangrene 03/09/2014   Umbilical hernia 03/09/2014   Encounter for therapeutic drug monitoring 08/20/2013   Chronic diastolic CHF (congestive heart failure) (HCC) 07/31/2013   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 03/24/2013   Pulmonary nodule, right 09/29/2012   Dyspnea 09/26/2012   Long term (current) use of anticoagulants 10/19/2010   CAROTID ARTERY STENOSIS  05/26/2009   ATRIAL FIBRILLATION 11/11/2008   Atrial flutter (HCC) 11/11/2008   ANEMIA, IRON DEFICIENCY 10/20/2008   HYPERTENSION, BENIGN 10/20/2008   CARDIOMYOPATHY 10/20/2008   CEREBROVASCULAR DISEASE 10/20/2008   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   GERD 10/20/2008   DEGENERATIVE JOINT DISEASE, GENERALIZED 10/20/2008   ISCHEMIC COLITIS, HX OF 10/20/2008   MITRAL VALVE REPLACEMENT, HX OF 10/20/2008   TOBACCO ABUSE, HX OF 10/20/2008   S/P mitral valve replacement 02/01/1998   PCP:  Merna Huxley, NP Pharmacy:   [facility] - [place], [place] - [address] [place] [address] Phone: [phone] Fax: [phone]  [facility] [address] [place], [place] - [address] [place] [address] Phone: [phone] Fax: [phone]     Social Drivers of Health (SDOH) Social History: SDOH Screenings   Food Insecurity: No Food Insecurity (05/02/2024)  Housing: Low Risk  (05/02/2024)  Transportation Needs: No Transportation Needs (05/02/2024)  Utilities: Not At Risk (05/02/2024)  Alcohol Screen: Low Risk  (07/10/2023)  Depression (PHQ2-9): Low Risk  (07/10/2023)  Financial Resource Strain: Low Risk  (07/10/2023)  Physical Activity: Inactive (07/10/2023)  Social Connections: Socially Isolated (05/02/2024)  Stress: No Stress Concern Present (07/10/2023)  Tobacco Use: Medium Risk (05/04/2024)  Health Literacy: Adequate Health Literacy (07/10/2023)   SDOH Interventions:     Readmission Risk Interventions     No data to display

## 2024-05-05 NOTE — Evaluation (Signed)
 Occupational Therapy Evaluation Patient Details Name: Jeffery Copeland MRN: [id number] DOB: [birth year] Today's Date: 05/05/2024   History of Present Illness   Pt is a 78 yr old male who presented 05/02/24 due to L hip pain due to a fall. Pt s/p 05/04/24 IM nailing of L hip and WBAT. 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 At this time pt limited by pain as attempted 3 trials to sit EOB and requesting to place LLE back in bed. He was able to completed long sitting in bed with max assist and BUE support. Patient will benefit from continued inpatient follow up therapy, <3 hours/day.     If plan is discharge home, recommend the following:   Two people to help with walking and/or transfers;A lot of help with bathing/dressing/bathroom;Assistance with cooking/housework;Assist for transportation;Help with stairs or ramp for entrance     Functional Status Assessment   Patient has had a recent decline in their functional status and demonstrates the ability to make significant improvements in function in a reasonable and predictable amount of time.     Equipment Recommendations    (TBD at next venue)     Recommendations for Other Services         Precautions/Restrictions   Precautions Precautions: Fall Recall of Precautions/Restrictions: Intact Restrictions Weight Bearing Restrictions Per Provider Order: Yes LLE Weight Bearing Per Provider Order: Weight bearing as tolerated     Mobility Bed Mobility Overal bed mobility: Needs Assistance Bed Mobility: Supine to Sit     Supine to sit: Max assist, HOB elevated, Used rails     General bed mobility comments: attempted 3 trials but did not go to the EOB as would report put my  leg back in the bed    Transfers                   General transfer comment: deffered      Balance Overall balance assessment: Needs assistance Sitting-balance support: Bilateral upper extremity supported Sitting balance-Leahy Scale: Poor Sitting balance - Comments: attempted long sitting as decline to go to EOB with BUE support with max assist                                   ADL either performed or assessed with clinical judgement   ADL Overall ADL's : Needs assistance/impaired Eating/Feeding: Set up;Sitting   Grooming: Wash/dry face;Set up;Sitting   Upper Body Bathing: Sitting;Minimal assistance   Lower Body Bathing: Maximal assistance;Total assistance;Bed level   Upper Body Dressing : Minimal assistance;Bed level   Lower Body Dressing: Maximal assistance;Total assistance;Bed level       Toileting- Clothing Manipulation and Hygiene: Total assistance;Bed level               Vision Baseline Vision/History: 1 Wears glasses Ability to See in Adequate Light: 0 Adequate Patient Visual Report: No change from baseline Vision Assessment?: Wears glasses for reading;Wears glasses for driving     Perception         Praxis         Pertinent Vitals/Pain Pain Assessment Pain Assessment: Faces Pain Score: 9  Pain Location: L hip Pain Descriptors / Indicators: Aching, Grimacing, Guarding Pain Intervention(s): Limited activity  within patient's tolerance, Monitored during session, Repositioned, Patient requesting pain meds-RN notified, RN gave pain meds during session     Extremity/Trunk Assessment Upper Extremity Assessment Upper Extremity Assessment: Generalized weakness   Lower Extremity Assessment Lower Extremity Assessment: Defer to PT evaluation   Cervical / Trunk Assessment Cervical / Trunk Assessment: Kyphotic   Communication Communication Communication: Impaired Factors Affecting Communication: Hearing impaired (L side did  better with)   Cognition Arousal: Alert Behavior During Therapy: WFL for tasks assessed/performed Cognition: No apparent impairments                               Following commands: Intact       Cueing  General Comments   Cueing Techniques: Verbal cues      Exercises     Shoulder Instructions      Home Living Family/patient expects to be discharged to:: Private residence Living Arrangements: Alone   Type of Home: Apartment Home Access: Level entry     Home Layout: One level     Bathroom Shower/Tub: Chief Strategy Officer: Standard Bathroom Accessibility: Yes   Home Equipment: Agricultural consultant (2 wheels);Rollator (4 wheels);Tub bench          Prior Functioning/Environment Prior Level of Function : Independent/Modified Independent             Mobility Comments: 4ww vs RW ADLs Comments: mod Indep    OT Problem List: Decreased strength;Decreased activity tolerance;Impaired balance (sitting and/or standing);Decreased safety awareness;Decreased knowledge of use of DME or AE;Pain   OT Treatment/Interventions: Self-care/ADL training;Therapeutic exercise;DME and/or AE instruction;Therapeutic activities;Patient/family education;Balance training      OT Goals(Current goals can be found in the care plan section)   Acute Rehab OT Goals Patient Stated Goal: none OT Goal Formulation: With patient Time For Goal Achievement: 05/19/24 Potential to Achieve Goals: Fair   OT Frequency:  Min 2X/week    Co-evaluation              AM-PAC OT 6 Clicks Daily Activity     Outcome Measure Help from another person eating meals?: None Help from another person taking care of personal grooming?: A Little Help from another person toileting, which includes using toliet, bedpan, or urinal?: Total Help from another person bathing (including washing, rinsing, drying)?: A Lot Help from another person to put on and taking off regular upper body  clothing?: A Little Help from another person to put on and taking off regular lower body clothing?: A Lot 6 Click Score: 15   End of Session Nurse Communication: Mobility status  Activity Tolerance: Patient limited by pain Patient left: in bed;with call bell/phone within reach;with bed alarm set  OT Visit Diagnosis: Unsteadiness on feet (R26.81);Other abnormalities of gait and mobility (R26.89);Repeated falls (R29.6);Muscle weakness (generalized) (M62.81);Pain Pain - Right/Left: Left Pain - part of body: Hip                Time: 9064-9041 OT Time Calculation (min): 23 min Charges:  OT General Charges $OT Visit: 1 Visit OT Evaluation $OT Eval Low Complexity: 1 Low OT Treatments $Self Care/Home Management : 8-22 mins  Warrick POUR OTR/L  Acute Rehab Services  [phone] office number   Warrick Berber 05/05/2024, 10:08 AM

## 2024-05-05 NOTE — Progress Notes (Signed)
 Patient transferred to 5N-09 via bed per swat RNS. Pt AO x4 at the time of transfer. Patient belongings sent with patient.

## 2024-05-06 DIAGNOSIS — S72002D Fracture of unspecified part of neck of left femur, subsequent encounter for closed fracture with routine healing: Secondary | ICD-10-CM | POA: Diagnosis not present

## 2024-05-06 DIAGNOSIS — M6281 Muscle weakness (generalized): Secondary | ICD-10-CM | POA: Diagnosis not present

## 2024-05-06 DIAGNOSIS — S72002S Fracture of unspecified part of neck of left femur, sequela: Secondary | ICD-10-CM | POA: Diagnosis not present

## 2024-05-06 DIAGNOSIS — J9610 Chronic respiratory failure, unspecified whether with hypoxia or hypercapnia: Secondary | ICD-10-CM | POA: Diagnosis not present

## 2024-05-06 DIAGNOSIS — Y92009 Unspecified place in unspecified non-institutional (private) residence as the place of occurrence of the external cause: Secondary | ICD-10-CM | POA: Diagnosis not present

## 2024-05-06 DIAGNOSIS — S72142D Displaced intertrochanteric fracture of left femur, subsequent encounter for closed fracture with routine healing: Secondary | ICD-10-CM | POA: Diagnosis not present

## 2024-05-06 DIAGNOSIS — R531 Weakness: Secondary | ICD-10-CM | POA: Diagnosis not present

## 2024-05-06 DIAGNOSIS — R2689 Other abnormalities of gait and mobility: Secondary | ICD-10-CM | POA: Diagnosis not present

## 2024-05-06 DIAGNOSIS — W010XXA Fall on same level from slipping, tripping and stumbling without subsequent striking against object, initial encounter: Secondary | ICD-10-CM | POA: Diagnosis not present

## 2024-05-06 DIAGNOSIS — J189 Pneumonia, unspecified organism: Secondary | ICD-10-CM | POA: Diagnosis not present

## 2024-05-06 DIAGNOSIS — Z741 Need for assistance with personal care: Secondary | ICD-10-CM | POA: Diagnosis not present

## 2024-05-06 DIAGNOSIS — N400 Enlarged prostate without lower urinary tract symptoms: Secondary | ICD-10-CM | POA: Diagnosis not present

## 2024-05-06 DIAGNOSIS — M79605 Pain in left leg: Secondary | ICD-10-CM | POA: Diagnosis not present

## 2024-05-06 DIAGNOSIS — I4891 Unspecified atrial fibrillation: Secondary | ICD-10-CM | POA: Diagnosis not present

## 2024-05-06 DIAGNOSIS — I5032 Chronic diastolic (congestive) heart failure: Secondary | ICD-10-CM | POA: Diagnosis not present

## 2024-05-06 DIAGNOSIS — Y9301 Activity, walking, marching and hiking: Secondary | ICD-10-CM | POA: Diagnosis not present

## 2024-05-06 DIAGNOSIS — G40909 Epilepsy, unspecified, not intractable, without status epilepticus: Secondary | ICD-10-CM | POA: Diagnosis not present

## 2024-05-06 DIAGNOSIS — G4089 Other seizures: Secondary | ICD-10-CM | POA: Diagnosis not present

## 2024-05-06 DIAGNOSIS — Z743 Need for continuous supervision: Secondary | ICD-10-CM | POA: Diagnosis not present

## 2024-05-06 DIAGNOSIS — Z4789 Encounter for other orthopedic aftercare: Secondary | ICD-10-CM | POA: Diagnosis not present

## 2024-05-06 DIAGNOSIS — Z23 Encounter for immunization: Secondary | ICD-10-CM | POA: Diagnosis present

## 2024-05-06 DIAGNOSIS — R41841 Cognitive communication deficit: Secondary | ICD-10-CM | POA: Diagnosis not present

## 2024-05-06 DIAGNOSIS — I679 Cerebrovascular disease, unspecified: Secondary | ICD-10-CM | POA: Diagnosis not present

## 2024-05-06 DIAGNOSIS — I509 Heart failure, unspecified: Secondary | ICD-10-CM | POA: Diagnosis not present

## 2024-05-06 DIAGNOSIS — R2681 Unsteadiness on feet: Secondary | ICD-10-CM | POA: Diagnosis not present

## 2024-05-06 DIAGNOSIS — J449 Chronic obstructive pulmonary disease, unspecified: Secondary | ICD-10-CM | POA: Diagnosis not present

## 2024-05-06 LAB — CBC
HCT: 27.3 % — ABNORMAL LOW (ref 39.0–52.0)
Hemoglobin: 9 g/dL — ABNORMAL LOW (ref 13.0–17.0)
MCH: 32.8 pg (ref 26.0–34.0)
MCHC: 33 g/dL (ref 30.0–36.0)
MCV: 99.6 fL (ref 80.0–100.0)
Platelets: 148 K/uL — ABNORMAL LOW (ref 150–400)
RBC: 2.74 MIL/uL — ABNORMAL LOW (ref 4.22–5.81)
RDW: 13.6 % (ref 11.5–15.5)
WBC: 8.7 K/uL (ref 4.0–10.5)
nRBC: 0 % (ref 0.0–0.2)

## 2024-05-06 LAB — BASIC METABOLIC PANEL WITH GFR
Anion gap: 9 (ref 5–15)
BUN: 11 mg/dL (ref 8–23)
CO2: 30 mmol/L (ref 22–32)
Calcium: 8.4 mg/dL — ABNORMAL LOW (ref 8.9–10.3)
Chloride: 97 mmol/L — ABNORMAL LOW (ref 98–111)
Creatinine, Ser: 0.85 mg/dL (ref 0.61–1.24)
GFR, Estimated: >60 mL/min (ref >60)
Glucose, Bld: 93 mg/dL (ref 70–99)
Potassium: 4.7 mmol/L (ref 3.5–5.1)
Sodium: 136 mmol/L (ref 135–145)

## 2024-05-06 LAB — PROTIME-INR
INR: 1.6 — ABNORMAL HIGH (ref 0.8–1.2)
Prothrombin Time: 20 s — ABNORMAL HIGH (ref 11.4–15.2)

## 2024-05-06 LAB — MAGNESIUM: Magnesium: 1.8 mg/dL (ref 1.7–2.4)

## 2024-05-06 MED ORDER — METOPROLOL TARTRATE 12.5 MG HALF TABLET
12.5000 mg | ORAL_TABLET | Freq: Two times a day (BID) | ORAL | Status: DC
  Administered 2024-05-06: 12.5 mg via ORAL
  Filled 2024-05-06: qty 1

## 2024-05-06 MED ORDER — OXYCODONE HCL 5 MG PO TABS: 5.0000 mg | ORAL_TABLET | ORAL | 0 refills | Status: AC | PRN

## 2024-05-06 MED ORDER — WARFARIN SODIUM 6 MG PO TABS
6.0000 mg | ORAL_TABLET | Freq: Once | ORAL | Status: DC
  Filled 2024-05-06: qty 1

## 2024-05-06 MED ORDER — ENSURE PLUS HIGH PROTEIN PO LIQD: 237.0000 mL | Freq: Two times a day (BID) | ORAL | Status: DC

## 2024-05-06 NOTE — TOC Progression Note (Signed)
 Transition of Care [facility]) - Progression Note    Patient Details  Name: Jeffery Copeland MRN: [id number] Date of Birth: [birth year]  Transition of [facility]) CM/SW Contact  Bridget Cordella Simmonds, LCSW Phone Number: 05/06/2024, 9:53 AM  Clinical Narrative:   CSW spoke with pt son [name], bed offers provided, son would like to accept offer at [facility].  CSW confirmed with [facility] that they can receive pt today.  0945: SNF auth request submitted in Nav[employer] and approved: [id number], 3 days: 10/8-10/10.  MD informed.    Expected Discharge Plan: Skilled Nursing Facility Barriers to Discharge: Continued Medical Work up, SNF Pending bed offer               Expected Discharge Plan and Services In-house Referral: Clinical Social Work   Post Acute Care Choice: Skilled Nursing Facility Living arrangements for the past 2 months: Single Family Home                                       Social Drivers of Health (SDOH) Interventions SDOH Screenings   Food Insecurity: No Food Insecurity (05/02/2024)  Housing: Low Risk  (05/02/2024)  Transportation Needs: No Transportation Needs (05/02/2024)  Utilities: Not At Risk (05/02/2024)  Alcohol Screen: Low Risk  (07/10/2023)  Depression (PHQ2-9): Low Risk  (07/10/2023)  Financial Resource Strain: Low Risk  (07/10/2023)  Physical Activity: Inactive (07/10/2023)  Social Connections: Socially Isolated (05/02/2024)  Stress: No Stress Concern Present (07/10/2023)  Tobacco Use: Medium Risk (05/04/2024)  Health Literacy: Adequate Health Literacy (07/10/2023)    Readmission Risk Interventions     No data to display

## 2024-05-06 NOTE — Progress Notes (Signed)
 Initial Nutrition Assessment  DOCUMENTATION CODES:   Severe malnutrition in context of chronic illness  INTERVENTION:  Ensure Plus High Protein po BID, each supplement provides 350 kcal and 20 grams of protein.  Magic cup TID with meals, each supplement provides 290 kcal and 9 grams of protein  Discussed high calorie, high protein nutrition therapy and attached education to AVS  Encouraged adequate PO intake to help meet increased calorie and protein needs for recovery   NUTRITION DIAGNOSIS:   Severe Malnutrition related to chronic illness as evidenced by severe fat depletion, severe muscle depletion.  GOAL:   Patient will meet greater than or equal to 90% of their needs  MONITOR:   PO intake, Supplement acceptance  REASON FOR ASSESSMENT:   Consult Assessment of nutrition requirement/status  ASSESSMENT:   Pt with hx of chronic hypoxic respiratory failure (on 2L) in setting of COPD, atrial fibrillation, seizures, and HLD. Admitted from home after mechanical fall w/ L hip pain; diagnosed L hip fx.  10/4 admitted 10/6 IM nailing of L hip   Pt being discharged to SNF today, has bed offer at [facility]. Spoke with pt who reports decent appetite, feels like it mimics his baseline appetite. Diet summary shows an average intake of 85% of last two meals. Pt would benefit from Ensure supplements BID and other high calorie, high protein meals (added information to AVS). Pt has not had bowel movement recorded since 10/4, but pt reports he went on 10/6. Bowel movements still do not seem to mimic pt's bowel habits at home and would benefit from continued bowel regimen.   PTA, pt reports good appetite, eating 2-3 x per day. Pt does not report any ONS use. Pt reports eating variety of foods and does not avoid any foods. Pt states he chews meat well with his top teeth and bottom gums and has no difficulty swallowing. Pt eats a variety of meat, vegetables, and sides but unable to provide  specific details on types or amounts of foods he would eat. Suspect pt's appetite has decreased with age and suspect he chronically has not been meeting increased calorie and protein needs related to progressive COPD.   Pt uses walker at baseline for mobility.   Pt reports no recent wt loss, but physical exam shows severe fat and severe muscle depletions. Suspect malnutrition in the context of chronic illness related to COPD and aging. Discussed importance of increased protein and calorie intake to support recovery and healing.    Average Meal Completion: 10/6-10/7: 85% average intake x 2 recorded meals  Medications: Vitamin C 1000 mg daily Vitamin D3 1000 units BID Colace  Labs reviewed  NUTRITION - FOCUSED PHYSICAL EXAM:  Flowsheet Row Most Recent Value  Orbital Region Severe depletion  Upper Arm Region Moderate depletion  Thoracic and Lumbar Region Severe depletion  Buccal Region Severe depletion  Temple Region Severe depletion  Clavicle Bone Region Severe depletion  Clavicle and Acromion Bone Region Severe depletion  Scapular Bone Region Severe depletion  Dorsal Hand Severe depletion  Patellar Region Severe depletion  [L leg edema, only assessed R leg]  Anterior Thigh Region Severe depletion  [L leg edema, only assessed R leg]  Posterior Calf Region Moderate depletion  [L leg edema, only assessed R leg]  Edema (RD Assessment) Mild  [L LE due to surgery]  Hair Reviewed  Eyes Reviewed  Mouth Reviewed  [missing bottom teeth]  Skin Reviewed  Nails Reviewed    Diet Order:   Diet Order  Diet regular Room service appropriate? Yes; Fluid consistency: Thin  Diet effective now                   EDUCATION NEEDS:   Education needs have been addressed  Skin:  Skin Assessment: Reviewed RN Assessment  Last BM:  10/4  Height:   Ht Readings from Last 1 Encounters:  05/04/24 5' 4 (1.626 m)    Weight:   Wt Readings from Last 1 Encounters:  05/05/24  60 kg    Ideal Body Weight:  59.1 kg  BMI:  Body mass index is 22.71 kg/m.  Estimated Nutritional Needs:   Kcal:  1600-1800  Protein:  70-90g  Fluid:  1.6-1.8L    Josette Glance, MS, RDN, LDN Clinical Dietitian I Please reach out via secure chat

## 2024-05-06 NOTE — Progress Notes (Signed)
 PHARMACY - ANTICOAGULATION CONSULT NOTE  Pharmacy Consult for warfarin Indication: atrial fibrillation  Allergies  Allergen Reactions   Ace Inhibitors Swelling   Lisinopril      Seizures    Ramipril      Other Reaction(s): gum swelling   Rosuvastatin Other (See Comments)    Muscle aches    Patient Measurements: Height: 5' 4 (162.6 cm) Weight: 60 kg (132 lb 4.4 oz) IBW/kg (Calculated) : 59.2 HEPARIN  DW (KG): 66.7  Vital Signs: Temp: 98 F (36.7 C) (10/08 0515) BP: 109/61 (10/08 0515) Pulse Rate: 54 (10/08 0515)  Labs: Recent Labs    05/04/24 0400 05/05/24 0347 05/06/24 0412  HGB 10.1* 8.9* 9.0*  HCT 30.4* 26.6* 27.3*  PLT 122* 116* 148*  LABPROT 15.2 15.0 20.0*  INR 1.1 1.1 1.6*  CREATININE 1.04 0.77 0.85    Estimated Creatinine Clearance: 60 mL/min (by C-G formula based on SCr of 0.85 mg/dL).   Assessment: 78 yo male on chronic warfarin for afib w/ hx bMVR x 2.  Warfarin was held and INR reversed with vitamin K 2mg  upon admission for repair of hip fracture. Pharmacy asked to resume warfarin on 10/6. S/p IM nail 10/6. Noted DDI with phenytoin  but patient is on prior to admission.   PLTs 148 and HgB stable at 9.0. INR up-trending 1.1 > 1.6 s/p 6mg  x 2 days.   PTA warfarin dosing was 3 mg daily with 6 mg on Sundays.   Goal of Therapy:  INR 2-3 Monitor platelets by anticoagulation protocol: Yes   Plan:  Give warfarin 6 mg PO x1 dose today.  Check INR daily while on warfarin.  Continue to monitor H&H and platelets.  Noted patient is on heparin  subcutaneous, will continue until patient's INR is therapeutic.    Thank you for allowing pharmacy to be a part of this patient's care.  Powell Blush, PharmD, BCCCP  Clinical Pharmacist

## 2024-05-06 NOTE — Discharge Summary (Signed)
 Physician Discharge Summary  Jeffery Copeland FMW:[id number] DOB: 1946-06-13 DOA: 05/02/2024  PCP: Merna Huxley, NP  Admit date: 05/02/2024 Discharge date: 05/06/2024  Admitted From: home Disposition:  SNF  Recommendations for Outpatient Follow-up:  Follow up with PCP in 1-2 weeks Please obtain BMP/CBC in one week Continue Coumadin  and INR monitoring  Home Health: none Equipment/Devices: none  Discharge Condition: stable CODE STATUS: DNR Diet Orders (From admission, onward)     Start     Ordered   05/04/24 1208  Diet regular Room service appropriate? Yes; Fluid consistency: Thin  Diet effective now       Question Answer Comment  Room service appropriate? Yes   Fluid consistency: Thin      05/04/24 1207            Brief Hospital Course: Patient with PMH of COPD, paroxysmal A-fib, on warfarin, seizures, bioprosthetic MVR x 2, HLD, chronic hypoxic respiratory failure on 2 LPM. Present to the hospital with a mechanical fall. Reports that he was walking and tripped over and had a fall.  Denies having any head injury or neck injury. Found to have a left femoral neck fracture. Orthopedic surgery was consulted.   Hospital Course / Discharge diagnoses: Principal problem Left hip fracture, mechanical fall - Denies any head injury or neck injury.  Orthopedic surgery consulted, he is status post IM rod Ortho/weeks.  Recovered well postoperatively, was able to work with PT and short-term rehab was recommended.  Active problems Paroxysmal A-fib. History of bioprosthetic MVR x 2 - On Coumadin , was on hold perioperatively, now resumed. History of seizure -continue Keppra  and Dilantin . No recent seizures witnessed by family. PAF, with RVR -continue metoprolol , anticoagulation with Coumadin .  He briefly required Cardizem infusion on admission for RVR BPH - On Flomax  HLD - Zetia . Leukocytosis - Likely stress demarcation.  Resolved Vitamin D  deficiency-continue supplementation History  of COPD -no wheezing, this is stable and at baseline  Sepsis ruled out   Discharge Instructions   Allergies as of 05/06/2024       Reactions   Ace Inhibitors Swelling   Lisinopril     Seizures   Ramipril     Other Reaction(s): gum swelling   Rosuvastatin Other (See Comments)   Muscle aches        Medication List     STOP taking these medications    traMADol  50 MG tablet Commonly known as: ULTRAM        TAKE these medications    albuterol  108 (90 Base) MCG/ACT inhaler Commonly known as: VENTOLIN  HFA Inhale 2 puffs into the lungs every 6 (six) hours as needed for wheezing or shortness of breath.   Breztri  Aerosphere 160-9-4.8 MCG/ACT Aero inhaler Generic drug: budesonide-glycopyrrolate-formoterol Inhale 2 puffs into the lungs in the morning and at bedtime.   cholecalciferol  25 MCG (1000 UNIT) tablet Commonly known as: VITAMIN D3 Take 1,000 Units by mouth daily.   ezetimibe  10 MG tablet Commonly known as: ZETIA  TAKE 1 TABLET EVERY DAY   levETIRAcetam  500 MG tablet Commonly known as: KEPPRA  Take 1 tablet (500 mg total) by mouth 2 (two) times daily.   metoprolol  tartrate 25 MG tablet Commonly known as: LOPRESSOR  TAKE 1/2 TABLET TWICE DAILY   oxyCODONE  5 MG immediate release tablet Commonly known as: Oxy IR/ROXICODONE  Take 1 tablet (5 mg total) by mouth every 4 (four) hours as needed for severe pain (pain score 7-10) or breakthrough pain.   phenytoin  100 MG ER capsule Commonly known as: DILANTIN  Take 3  capsules (300 mg total) by mouth at bedtime.   PRESERVISION AREDS 2+MULTI VIT PO Take 1 capsule by mouth daily.   tamsulosin  0.4 MG Caps capsule Commonly known as: FLOMAX  Take 0.4 mg by mouth daily.   tiZANidine  4 MG tablet Commonly known as: ZANAFLEX  TAKE 1 TABLET EVERY 6 HOURS AS NEEDED FOR MUSCLE SPASMS   warfarin 6 MG tablet Commonly known as: COUMADIN  Take as directed. If you are unsure how to take this medication, talk to your nurse or  doctor. Original instructions: TAKE 1/2 TO 1 TABLET ONE TIME DAILY AS DIRECTED BY COUMADIN  CLINIC What changed: See the new instructions.       Consultations: Orthopedic surgery   Procedures/Studies:  DG CHEST PORT 1 VIEW Result Date: 05/05/2024 CLINICAL DATA:  Shortness of breath. EXAM: PORTABLE CHEST 1 VIEW COMPARISON:  Radiograph 05/02/2024, CT 03/13/2023 FINDINGS: Chronic volume loss in the left hemithorax, however increasing left lung base opacity. Question pleural effusion versus overlapping soft tissues. Allowing for differences in technique, stable heart size and mediastinal contours with prosthetic cardiac valve and aortic atherosclerosis. Increasing perihilar opacities and interstitial thickening, may represent pulmonary edema or atypical infection. No visible pneumothorax. IMPRESSION: 1. Increasing perihilar opacities and interstitial thickening, may represent pulmonary edema or atypical infection. 2. Chronic volume loss in the left hemithorax, however increasing left lung base opacity. Question pleural effusion versus overlapping soft tissues. Electronically Signed   By: Andrea Gasman M.D.   On: 05/05/2024 12:06   ECHOCARDIOGRAM COMPLETE Result Date: 05/04/2024    ECHOCARDIOGRAM REPORT   Patient Name:   Jeffery Copeland Date of Exam: 05/04/2024 Medical Rec #:  [id number]     Height:       64.0 in Accession #:    [id number]    Weight:       147.0 lb Date of Birth:  June 14, 1946      BSA:          1.716 m Patient Age:    78 years      BP:           123/72 mmHg Patient Gender: M             HR:           113 bpm. Exam Location:  Inpatient Procedure: 2D Echo (Both Spectral and Color Flow Doppler were utilized during            procedure). Indications:    Preoperative evaluation  History:        Patient has prior history of Echocardiogram examinations.                 Cardiomyopathy; Arrythmias:Atrial Fibrillation.                  Mitral Valve: 29 mm Medtronic-Hall bioprosthetic valve valve is                  present in the mitral position. Procedure Date: 2019.  Sonographer:    Charmaine Gaskins Referring Phys: [phone] KEITH PAUL IMPRESSIONS  1. Left ventricular ejection fraction, by estimation, is 45 to 50%. The left ventricle has mildly decreased function. The left ventricle demonstrates regional wall motion abnormalities (see scoring diagram/findings for description). Left ventricular diastolic parameters are indeterminate.  2. Right ventricular systolic function is mildly reduced. The right ventricular size is mildly enlarged. There is severely elevated pulmonary artery systolic pressure. The estimated right ventricular systolic pressure is 66.4 mmHg.  3. Left atrial size was severely dilated.  4. Right atrial size was moderately dilated.  5. Increased perivavluar mean gradient from 3.5 to 8 mmHg. The mitral valve has been repaired/replaced. No evidence of mitral valve regurgitation. Moderate mitral stenosis. The mean mitral valve gradient is 8.2 mmHg. There is a 29 mm Medtronic-Hall bioprosthetic valve present in the mitral position. Procedure Date: 2019.  6. Tricuspid valve regurgitation is moderate.  7. The aortic valve is tricuspid. There is mild calcification of the aortic valve. There is mild thickening of the aortic valve. Aortic valve regurgitation is trivial. Aortic valve sclerosis is present, with no evidence of aortic valve stenosis.  8. The inferior vena cava is normal in size with greater than 50% respiratory variability, suggesting right atrial pressure of 3 mmHg. FINDINGS  Left Ventricle: Left ventricular ejection fraction, by estimation, is 45 to 50%. The left ventricle has mildly decreased function. The left ventricle demonstrates regional wall motion abnormalities. The left ventricular internal cavity size was normal in size. There is no left ventricular hypertrophy. Left ventricular diastolic parameters are indeterminate.  LV Wall Scoring: The basal inferior segment is akinetic. Right  Ventricle: The right ventricular size is mildly enlarged. No increase in right ventricular wall thickness. Right ventricular systolic function is mildly reduced. There is severely elevated pulmonary artery systolic pressure. The tricuspid regurgitant velocity is 3.98 m/s, and with an assumed right atrial pressure of 3 mmHg, the estimated right ventricular systolic pressure is 66.4 mmHg. Left Atrium: Left atrial size was severely dilated. Right Atrium: Right atrial size was moderately dilated. Pericardium: There is no evidence of pericardial effusion. Mitral Valve: Increased perivavluar mean gradient from 3.5 to 8 mmHg. The mitral valve has been repaired/replaced. No evidence of mitral valve regurgitation. There is a 29 mm Medtronic-Hall bioprosthetic valve present in the mitral position. Procedure Date: 2019. Moderate mitral valve stenosis. MV peak gradient, 14.5 mmHg. The mean mitral valve gradient is 8.2 mmHg. Tricuspid Valve: The tricuspid valve is normal in structure. Tricuspid valve regurgitation is moderate . No evidence of tricuspid stenosis. Aortic Valve: The aortic valve is tricuspid. There is mild calcification of the aortic valve. There is mild thickening of the aortic valve. Aortic valve regurgitation is trivial. Aortic valve sclerosis is present, with no evidence of aortic valve stenosis. Pulmonic Valve: The pulmonic valve was normal in structure. Pulmonic valve regurgitation is not visualized. No evidence of pulmonic stenosis. Aorta: The aortic root is normal in size and structure. Venous: The inferior vena cava is normal in size with greater than 50% respiratory variability, suggesting right atrial pressure of 3 mmHg. IAS/Shunts: No atrial level shunt detected by color flow Doppler.  LEFT VENTRICLE PLAX 2D LVIDd:         4.20 cm     Diastology LVIDs:         3.40 cm     LV e' medial:    6.49 cm/s LV PW:         1.00 cm     LV E/e' medial:  28.2 LV IVS:        0.90 cm     LV e' lateral:   7.43 cm/s  LVOT diam:     2.30 cm     LV E/e' lateral: 24.6 LVOT Area:     4.15 cm  LV Volumes (MOD) LV vol d, MOD A2C: 77.5 ml LV vol d, MOD A4C: 81.3 ml LV vol s, MOD A2C: 50.2 ml LV vol s, MOD A4C: 47.7 ml LV SV MOD A2C:  27.3 ml LV SV MOD A4C:     81.3 ml LV SV MOD BP:      31.0 ml RIGHT VENTRICLE RV Basal diam:  4.20 cm RV Mid diam:    3.90 cm RV S prime:     8.92 cm/s TAPSE (M-mode): 1.1 cm LEFT ATRIUM              Index        RIGHT ATRIUM           Index LA diam:        5.50 cm  3.20 cm/m   RA Area:     20.90 cm LA Vol (A2C):   134.0 ml 78.07 ml/m  RA Volume:   63.70 ml  37.11 ml/m LA Vol (A4C):   149.0 ml 86.81 ml/m LA Biplane Vol: 143.0 ml 83.31 ml/m   AORTA Ao Root diam: 3.50 cm Ao Asc diam:  3.50 cm MITRAL VALVE                TRICUSPID VALVE MV Area (PHT): 4.29 cm     TR Peak grad:   63.4 mmHg MV Peak grad:  14.5 mmHg    TR Vmax:        398.00 cm/s MV Mean grad:  8.2 mmHg MV Vmax:       1.90 m/s     SHUNTS MV Vmean:      133.2 cm/s   Systemic Diam: 2.30 cm MV Decel Time: 177 msec MV E velocity: 183.00 cm/s Oneil Parchment MD Electronically signed by Oneil Parchment MD Signature Date/Time: 05/04/2024/4:49:44 PM    Final    DG FEMUR PORT MIN 2 VIEWS LEFT Result Date: 05/04/2024 CLINICAL DATA:  Fracture, postop. EXAM: LEFT FEMUR PORTABLE 2 VIEWS COMPARISON:  Radiograph 05/02/2024 FINDINGS: Femoral intramedullary nail with trans trochanteric and distal locking screw fixation traverse proximal femur fracture. Slightly decreased fracture displacement from preoperative imaging. Recent postsurgical change includes air and edema in the soft tissues. Peripheral vascular calcifications are seen. IMPRESSION: ORIF of proximal femur fracture, with slightly decreased fracture displacement from preoperative imaging. Electronically Signed   By: Andrea Gasman M.D.   On: 05/04/2024 14:34   DG FEMUR MIN 2 VIEWS LEFT Result Date: 05/04/2024 CLINICAL DATA:  Elective surgery. EXAM: DG FEMUR 2+V*L* COMPARISON:  Preoperative  imaging FINDINGS: Six fluoroscopic spot views of the femur submitted from the operating room. Femoral intramedullary nail with trans trochanteric and distal locking screw fixation traverse proximal femur fracture. Fluoroscopy time 1 minutes 49 seconds. Dose 13.39 mGy. IMPRESSION: Intraoperative fluoroscopy during proximal femur fracture ORIF. Electronically Signed   By: Andrea Gasman M.D.   On: 05/04/2024 13:25   DG C-Arm 1-60 Min-No Report Result Date: 05/04/2024 Fluoroscopy was utilized by the requesting physician.  No radiographic interpretation.   DG C-Arm 1-60 Min-No Report Result Date: 05/04/2024 Fluoroscopy was utilized by the requesting physician.  No radiographic interpretation.   DG Hip Unilat With Pelvis 2-3 Views Left Result Date: 05/02/2024 CLINICAL DATA:  fall, pain in hip EXAM: DG HIP (WITH OR WITHOUT PELVIS) 2-3V LEFT COMPARISON:  None Available. FINDINGS: Osteopenia. No pelvic fracture or pelvic bone diastasis.Impacted, comminuted and displaced intertrochanteric fracture of the left femoral neck. The femoral shaft is medially displaced 2.1 cm.No hip dislocation.Degenerative disc disease of the visualized lumbar spine. Aortoiliac and peripheral vascular atherosclerosis. IMPRESSION: Comminuted, displaced intertrochanteric fracture of the left femoral neck. Electronically Signed   By: Rogelia Myers M.D.   On: 05/02/2024 19:08  DG Chest Port 1 View Result Date: 05/02/2024 CLINICAL DATA:  fall, pain in hip EXAM: PORTABLE CHEST - 1 VIEW COMPARISON:  03/13/2023 FINDINGS: Low lung volumes. Diffuse bilateral perihilar interstitial opacities. Similar chronic blunting and irregular contour of the left costophrenic sulcus and hemidiaphragm. No focal airspace consolidation, pleural effusion, or pneumothorax. Moderate cardiomegaly. Cardiac valve replacement. Sternotomy wires. Tortuous aorta with aortic atherosclerosis. No acute fracture or destructive lesions. Multilevel thoracic osteophytosis.  IMPRESSION: Low lung volumes. Bilateral perihilar interstitial opacities, which may represent bronchovascular crowding due to low lung volumes, interstitial edema, or atypical/viral infection, in the correct clinical context. Electronically Signed   By: Rogelia Myers M.D.   On: 05/02/2024 19:06     Subjective: - no chest pain, shortness of breath, no abdominal pain, nausea or vomiting.   Discharge Exam: BP (!) 141/63 (BP Location: Left Arm)   Pulse 74   Temp 98.7 F (37.1 C) (Oral)   Resp 16   Ht 5' 4 (1.626 m)   Wt 60 kg   SpO2 99%   BMI 22.71 kg/m   General: Pt is alert, awake, not in acute distress Cardiovascular: RRR, S1/S2 +, no rubs, no gallops Respiratory: CTA bilaterally, no wheezing, no rhonchi Abdominal: Soft, NT, ND, bowel sounds + Extremities: no edema, no cyanosis    The results of significant diagnostics from this hospitalization (including imaging, microbiology, ancillary and laboratory) are listed below for reference.     Microbiology: Recent Results (from the past 240 hours)  Surgical pcr screen     Status: None   Collection Time: 05/04/24  8:09 AM   Specimen: Nasal Mucosa; Nasal Swab  Result Value Ref Range Status   MRSA, PCR NEGATIVE NEGATIVE Final   Staphylococcus aureus NEGATIVE NEGATIVE Final    Comment: (NOTE) The Xpert SA Assay (FDA approved for NASAL specimens in patients 64 years of age and older), is one component of a comprehensive surveillance program. It is not intended to diagnose infection nor to guide or monitor treatment. Performed at [facility], [address], [place], [place] [address]      Labs: Basic Metabolic Panel: Recent Labs  Lab 05/03/24 0505 05/03/24 1655 05/04/24 0400 05/05/24 0347 05/06/24 0412  NA 138 136 136 134* 136  K 5.2* 4.3 4.2 4.2 4.7  CL 102 101 101 101 97*  CO2 24 29 27 27 30   GLUCOSE 131* 126* 123* 113* 93  BUN 11 14 13 10 11   CREATININE 1.18 0.95 1.04 0.77 0.85  CALCIUM  8.3* 8.2* 8.0*  7.6* 8.4*  MG 2.1 2.0 1.8 1.7 1.8  PHOS 5.0*  --   --   --   --    Liver Function Tests: Recent Labs  Lab 05/03/24 0505  AST 17  ALT 13  ALKPHOS 102  BILITOT 0.7  PROT 5.5*  ALBUMIN  3.0*   CBC: Recent Labs  Lab 05/02/24 1832 05/02/24 1905 05/03/24 0505 05/04/24 0400 05/05/24 0347 05/06/24 0412  WBC 17.2*  --  20.2* 11.4* 11.4* 8.7  NEUTROABS 14.6*  --  16.8*  --   --   --   HGB 12.9* 13.6 11.4* 10.1* 8.9* 9.0*  HCT 39.6 40.0 35.7* 30.4* 26.6* 27.3*  MCV 99.7  --  100.8* 99.3 98.2 99.6  PLT 216  --  168 122* 116* 148*   CBG: No results for input(s): GLUCAP in the last 168 hours. Hgb A1c No results for input(s): HGBA1C in the last 72 hours. Lipid Profile No results for input(s):  CHOL, HDL, LDLCALC, TRIG, CHOLHDL, LDLDIRECT in the last 72 hours. Thyroid  function studies No results for input(s): TSH, T4TOTAL, T3FREE, THYROIDAB in the last 72 hours.  Invalid input(s): FREET3 Urinalysis    Component Value Date/Time   COLORURINE YELLOW 03/20/2013 1530   APPEARANCEUR CLEAR 03/20/2013 1530   LABSPEC 1.004 (L) 03/20/2013 1530   PHURINE 5.5 03/20/2013 1530   GLUCOSEU NEGATIVE 03/20/2013 1530   HGBUR NEGATIVE 03/20/2013 1530   BILIRUBINUR NEGATIVE 03/20/2013 1530   KETONESUR NEGATIVE 03/20/2013 1530   PROTEINUR NEGATIVE 03/20/2013 1530   UROBILINOGEN 0.2 03/20/2013 1530   NITRITE NEGATIVE 03/20/2013 1530   LEUKOCYTESUR NEGATIVE 03/20/2013 1530    FURTHER DISCHARGE INSTRUCTIONS:   Get Medicines reviewed and adjusted: Please take all your medications with you for your next visit with your Primary MD   Laboratory/radiological data: Please request your Primary MD to go over all hospital tests and procedure/radiological results at the follow up, please ask your Primary MD to get all Hospital records sent to his/her office.   In some cases, they will be blood work, cultures and biopsy results pending at the time of your discharge. Please  request that your primary care M.D. goes through all the records of your hospital data and follows up on these results.   Also Note the following: If you experience worsening of your admission symptoms, develop shortness of breath, life threatening emergency, suicidal or homicidal thoughts you must seek medical attention immediately by calling 911 or calling your MD immediately  if symptoms less severe.   You must read complete instructions/literature along with all the possible adverse reactions/side effects for all the Medicines you take and that have been prescribed to you. Take any new Medicines after you have completely understood and accpet all the possible adverse reactions/side effects.    Do not drive when taking Pain medications or sleeping medications (Benzodaizepines)   Do not take more than prescribed Pain, Sleep and Anxiety Medications. It is not advisable to combine anxiety,sleep and pain medications without talking with your primary care practitioner   Special Instructions: If you have smoked or chewed Tobacco  in the last 2 yrs please stop smoking, stop any regular Alcohol  and or any Recreational drug use.   Wear Seat belts while driving.   Please note: You were cared for by a hospitalist during your hospital stay. Once you are discharged, your primary care physician will handle any further medical issues. Please note that NO REFILLS for any discharge medications will be authorized once you are discharged, as it is imperative that you return to your primary care physician (or establish a relationship with a primary care physician if you do not have one) for your post hospital discharge needs so that they can reassess your need for medications and monitor your lab values.  Time coordinating discharge: 35 minutes  SIGNED:  Nilda Fendt, MD, PhD 05/06/2024, 10:20 AM

## 2024-05-06 NOTE — Discharge Instructions (Signed)
Nutrition Post Hospital Stay Proper nutrition can help your body recover from illness and injury.   Foods and beverages high in protein, vitamins, and minerals help rebuild muscle loss, promote healing, & reduce fall risk.   In addition to eating healthy foods, a nutrition shake is an easy, delicious way to get the nutrition you need during and after your hospital stay  It is recommended that you continue to drink 2 bottles per day of: Ensure for at least 1 month (30 days) after your hospital stay   Tips for adding a nutrition shake into your routine: As allowed, drink one with vitamins or medications instead of water or juice Enjoy one as a tasty mid-morning or afternoon snack Drink cold or make a milkshake out of it Drink one instead of milk with cereal or snacks Use as a coffee creamer   Available at the following grocery stores and pharmacies:           * [employer] * [employer] * [employer]  * [employer]          * [employer] * [employer]  * [facility]      * Target  * [employer]'s   * [facility]  * [employer]   * [facility] [phone]            For COUPONS visit: www.[email or web address]/join or [email or web address]/[email or web address]-[email or web address]   Suggested Substitutions Ensure Plus = Boost Plus = Carnation Breakfast Essentials = Boost Compact Ensure Active Clear = Boost Breeze Glucerna Shake = Boost Glucose Control = Carnation Breakfast Essentials SUGAR FREE     High-Calorie, High-Protein Nutrition Therapy (2021)  A high-calorie, high-protein diet has been recommended to you. Your registered dietitian nutritionist (RDN) may have recommended this diet because you are having difficulty eating enough calories throughout the day, you have lost weight, and/or you need to add protein to your diet. Sometimes you may not feel like eating, even if you know the importance of good nutrition. The recommendations in this handout can help you with the following: Regaining your strength and energy Keeping your body  healthy Healing and recovering from surgery or illness and fighting infection  Tips: Schedule Your Meals and Snacks Several small meals and snacks are often better tolerated and digested than large meals. Strategies Plan to eat 3 meals and 3 snacks daily. Experiment with timing meals to find out when you have a larger appetite. Appetite may be greatest in the morning after not eating all night so you may prefer to eat your larger meals and snacks in the morning and at lunch. Breakfast-type foods are often better tolerated so eat foods such as eggs, pancakes, waffles and cereal for any meal or snack. Carry snacks with you so you are prepared to eat every 2 to 3 hours. Determine what works best for you if your body's cues for feeling hungry or full are not working. Eat a small meal or snack even if you don't feel hungry. Set a timer to remind you when it is time to eat. Take a walk before you eat (with health care provider's approval). Light or moderate physical activity can help you maintain muscle and increase your appetite.  Make Eating Enjoyable Taking steps to make the experience enjoyable may help to increase your interest in eating and improve your appetite. Strategies: Eat with others whenever possible. Include your favorite foods to make meals more enjoyable. Try new foods. Save your beverage for the end of the meal so that   you have more room for food before you get full.  Add Calories to Your Meals and Snacks Try adding calorie-dense foods so that each bite provides more nutrition. Strategies Drink milk, chocolate milk, soy milk, or smoothies instead of low-calorie beverages such as diet drinks or water. Cook with milk or soy milk instead of water when making dishes such as hot cereal, cocoa, or pudding. Add jelly, jam, honey, butter or margarine to bread and crackers. Add jam or fruit to ice cream and as a topping over cake. Mix dried fruit, nuts, granola, honey, or dry  cereal with yogurt or hot cereals. Enjoy snacks such as milkshakes, smoothies, pudding, ice cream, or custard. Blend a fruit smoothie of a banana, frozen berries, milk or soy milk, and 1 tablespoon nonfat powdered milk or protein powder.  Add Protein to Your Meals and Snacks Choose at least one protein food at each meal and snack to increase your daily intake. Strategies Add  cup nonfat dry milk powder or protein powder to make a high-protein milk to drink or to use in recipes that call for milk. Vanilla or peppermint extract or unsweetened cocoa powder could help to boost the flavor. Add hard-cooked eggs, leftover meat, grated cheese, canned beans or tofu to noodles, rice, salads, sandwiches, soups, casseroles, pasta, tuna and other mixed dishes. Add powdered milk or protein powder to hot cereals, meatloaf, casseroles, scrambled eggs, sauces, cream soups, and shakes. Add beans and lentils to salads, soups, casseroles, and vegetable dishes. Eat cottage cheese or yogurt, especially Greek yogurt, with fruit as a snack or dessert. Eat peanut or other nut butters on crackers, bread, toast, waffles, apples, bananas or celery sticks. Add it to milkshakes, smoothies, or desserts. Consider a ready-made protein shake. Your RDN will make recommendations.  Add Fats to Your Meals and Snacks Try adding fats to your meals and snacks. Fat provides more calories in fewer bites than carbohydrate or protein and adds flavors to your foods. Strategies Snack on nuts and seeds or add them to foods like salads, pasta, cereals, yogurt, and ice cream.  Saut or stir-fry vegetables, meats, chicken, fish or tofu in olive or canola oil.  Add olive oil, other vegetable oils, butter or margarine to soups, vegetables, potatoes, cooked cereal, rice, pasta, bread, crackers, pancakes, or waffles. Snack on olives or add to pasta, pizza, or salad. Add avocado or guacamole to your salads, sandwiches, and other entrees. Include  fatty fish such as salmon in your weekly meal plan. For general food safety tips, especially for clients with immunocompromised conditions, ask your RDN for the Food Safety Nutrition Therapy handout.  Small Meal and Snack Ideas These snacks and meals are recommended when you have to eat but aren't necessarily hungry.  They are good choices because they are high in protein and high in calories.  2 graham crackers 2 tablespoons peanut or other nut butter 1 cup milk 2 slices whole wheat toast topped with:  avocado, mashed Seasoning of your choice   cup Greek yogurt  cup fruit  cup granola 2 deviled egg halves 5 whole wheat crackers  1 cup cream of tomato soup  grilled cheese sandwich 1 toasted waffle topped with: 2 tablespoons peanut or nut butter 1 tablespoon jam  Trail mix made with:  cup nuts  cup dried fruit  cup cold cereal, any variety  cup oatmeal or cream of wheat cereal 1 tablespoon peanut or nut butter  cup diced fruit   High-Calorie, High-Protein Sample 1-Day Menu   View Nutrient Info Breakfast 1 egg, scrambled 1 ounce cheddar cheese 1 English muffin, whole wheat 1 tablespoon margarine 1 tablespoon jam  cup orange juice, fortified with calcium and vitamin D  Morning Snack 1 tablespoon peanut butter 1 banana 1 cup 1% milk  Lunch Tuna salad sandwich made with: 2 slices bread, whole wheat 3 ounces tuna mixed with: 1 tablespoon mayonnaise  cup pudding  Afternoon Snack  cup hummus  cup carrots 1 pita  Evening Meal Enchilada casserole made with: 2 corn tortillas 3 ounces ground beef, cooked  cup black beans, cooked  cup corn, cooked 1 ounce grated cheddar cheese  cup enchilada sauce  avocado, sliced, topping for enchilada 1 tablespoon sour cream, topping for enchilada Salad:  cup lettuce, shredded  cup tomatoes, chopped, for salad 1 tablespoon olive oil and vinegar dressing, for salad  Evening Snack  cup Greek yogurt  cup blueberries  cup  granola   

## 2024-05-06 NOTE — Progress Notes (Signed)
 Report given to [facility] at [facility] facility.

## 2024-05-06 NOTE — Care Management Important Message (Signed)
 Important Message  Patient Details  Name: Jeffery Copeland MRN: [id number] Date of Birth: Dec 28, 1945   Important Message Given:  Yes - Medicare IM     Jon Cruel 05/06/2024, 4:52 PM

## 2024-05-06 NOTE — Plan of Care (Signed)
  Problem: Education: Goal: Knowledge of General Education information will improve Description: Including pain rating scale, medication(s)/side effects and non-pharmacologic comfort measures Outcome: Progressing   Problem: Coping: Goal: Level of anxiety will decrease Outcome: Progressing   Problem: Elimination: Goal: Will not experience complications related to urinary retention Outcome: Progressing   Problem: Safety: Goal: Ability to remain free from injury will improve Outcome: Progressing

## 2024-05-06 NOTE — Progress Notes (Signed)
 Orthopaedic Trauma Service Progress Note  Patient ID: Jeffery Copeland MRN: [id number] DOB/AGE: 04/22/1946 78 y.o.  Subjective:  Ortho issues stable  Agreeable to SNF   ROS As above  Today's  total administered Morphine  Milligram Equivalents: 7.5 Yesterday's total administered Morphine  Milligram Equivalents: 22.5  Objective:   VITALS:   Vitals:   05/05/24 1252 05/05/24 2012 05/06/24 0515 05/06/24 0748  BP: 112/60 116/68 109/61 (!) 141/63  Pulse: 60 78 (!) 54 74  Resp: 17 18 19 16   Temp: 98.4 F (36.9 C) 98.5 F (36.9 C) 98 F (36.7 C) 98.7 F (37.1 C)  TempSrc:    Oral  SpO2: 100% 100% 97% 99%  Weight:      Height:        Estimated body mass index is 22.71 kg/m as calculated from the following:   Height as of this encounter: 5' 4 (1.626 m).   Weight as of this encounter: 60 kg.   Intake/Output      10/07 0701 10/08 0700 10/08 0701 10/09 0700   P.O. 240    I.V. (mL/kg)     IV Piggyback     Total Intake(mL/kg) 240 (4)    Urine (mL/kg/hr) 700 (0.5)    Blood     Total Output 700    Net -460         Urine Occurrence 500 x      LABS  Results for orders placed or performed during the hospital encounter of 05/02/24 (from the past 24 hours)  Basic metabolic panel with GFR     Status: Abnormal   Collection Time: 05/06/24  4:12 AM  Result Value Ref Range   Sodium 136 135 - 145 mmol/L   Potassium 4.7 3.5 - 5.1 mmol/L   Chloride 97 (L) 98 - 111 mmol/L   CO2 30 22 - 32 mmol/L   Glucose, Bld 93 70 - 99 mg/dL   BUN 11 8 - 23 mg/dL   Creatinine, Ser 9.14 0.61 - 1.24 mg/dL   Calcium  8.4 (L) 8.9 - 10.3 mg/dL   GFR, Estimated >39 >39 mL/min   Anion gap 9 5 - 15  CBC     Status: Abnormal   Collection Time: 05/06/24  4:12 AM  Result Value Ref Range   WBC 8.7 4.0 - 10.5 K/uL   RBC 2.74 (L) 4.22 - 5.81 MIL/uL   Hemoglobin 9.0 (L) 13.0 - 17.0 g/dL   HCT 72.6 (L) 60.9 - 47.9 %   MCV 99.6  80.0 - 100.0 fL   MCH 32.8 26.0 - 34.0 pg   MCHC 33.0 30.0 - 36.0 g/dL   RDW 86.3 88.4 - 84.4 %   Platelets 148 (L) 150 - 400 K/uL   nRBC 0.0 0.0 - 0.2 %  Magnesium      Status: None   Collection Time: 05/06/24  4:12 AM  Result Value Ref Range   Magnesium  1.8 1.7 - 2.4 mg/dL  Protime-INR     Status: Abnormal   Collection Time: 05/06/24  4:12 AM  Result Value Ref Range   Prothrombin Time 20.0 (H) 11.4 - 15.2 seconds   INR 1.6 (H) 0.8 - 1.2   *Note: Due to a large number of results and/or encounters for the requested time period, some results have not been displayed. A complete set  of results can be found in Results Review.     PHYSICAL EXAM:   Gen: resting comfortably in bed, NAD, looks good  Lungs: unlabored Ext:       Left Lower Extremity Dressing is clean, dry and intact to left hip and thigh              Extremity is warm             No DCT             Compartments are soft             No pain out of proportion with passive stretching of toes or ankle             DPN, SPN, TN sensory functions are intact             EHL, FHL, lesser toe motor functions intact             Ankle flexion, extension, inversion eversion intact             + DP pulse  Assessment/Plan: 2 Days Post-Op   Principal Problem:   Closed left hip fracture (HCC) Active Problems:   Acute hip pain, left   Fall at home, initial encounter   Leukocytosis   Chronic hypoxic respiratory failure (HCC)   History of COPD   History of seizures   HLD (hyperlipidemia)   Anti-infectives (From admission, onward)    Start     Dose/Rate Route Frequency Ordered Stop   05/04/24 1600  ceFAZolin  (ANCEF ) IVPB 2g/100 mL premix        2 g 200 mL/hr over 30 Minutes Intravenous Every 6 hours 05/04/24 1207 05/04/24 2331   05/04/24 0745  ceFAZolin  (ANCEF ) IVPB 2g/100 mL premix        2 g 200 mL/hr over 30 Minutes Intravenous On call to O.R. 05/04/24 [id number] 05/04/24 0942     .  POD/HD#: 56  78 year old male s/p  fall with  left four-part peritrochanteric hip fracture s/p intramedullary nailing   Weightbearing: WBAT LLE with assistance (walker) ROM: Unrestricted range of motion of left hip and knee Insicional and dressing care: Daily dressing changes with 4 x 4 gauze and tape or Mepilex starting today Pain management: Multimodal, minimize narcotics   ID: Perioperative antibiotics Hemodynamics/ABL anemia: Acute blood loss anemia noted, monitor, asymptomatic currently.  CBC in the morning   Impediments to Fracture Healing: Vitamin D  insufficiency, fragility fracture Bone Health/Optimization: Vitamin D3 5000 IUs daily   Orthopedic device(s): Walker   Showering: Okay to shower and then clean wounds with soap and water once there is no drainage   VTE prophylaxis: Resume home anticoagulation with Coumadin .  Currently on bridge with SQ heparin    Dispo: Therapy evaluations, TOC consult for SNF   Follow - up plan: 2 weeks with orthopedic trauma service     Francis MICAEL Mt, PA-C [phone] (C) 05/06/2024, 10:18 AM  [facility] [address] [place] [address] [phone] (F)    After 5pm and on the weekends please log on to Amion, go to orthopaedics and the look under the [facility] Call for the provider(s) on call. You can also call our office at [phone] and then follow the prompts to be connected to the call team.  Patient ID: Jeffery Copeland, male   DOB: 1945/10/14, 78 y.o.   MRN: [id number]

## 2024-05-06 NOTE — TOC Transition Note (Signed)
 Transition of Care [facility]) - Discharge Note   Patient Details  Name: Jeffery Copeland MRN: [id number] Date of Birth: 17-Jun-1946  [facility]) CM/SW Contact:  Bridget Cordella Simmonds, LCSW Phone Number: 05/06/2024, 11:34 AM   Clinical Narrative:   Pt discharging to [facility], room 126A.  RN call report to [phone].  PTAR called [phone].     Final next level of care: Skilled Nursing Facility Barriers to Discharge: Barriers Resolved   Patient Goals and CMS Choice   CMS Medicare.gov Compare Post Acute Care list provided to:: Patient (to pt and son Jeffery Copeland) Choice offered to / list presented to : Patient, Adult Children (son Jeffery Copeland)      Discharge Placement              Patient chooses bed at: [facility] Patient to be transferred to facility by: ptar Name of family member notified: son Jeffery Copeland Patient and family notified of of transfer: 05/06/24  Discharge Plan and Services Additional resources added to the After Visit Summary for   In-house Referral: Clinical Social Work   Post Acute Care Choice: Skilled Nursing Facility                               Social Drivers of Health (SDOH) Interventions SDOH Screenings   Food Insecurity: No Food Insecurity (05/02/2024)  Housing: Low Risk  (05/02/2024)  Transportation Needs: No Transportation Needs (05/02/2024)  Utilities: Not At Risk (05/02/2024)  Alcohol Screen: Low Risk  (07/10/2023)  Depression (PHQ2-9): Low Risk  (07/10/2023)  Financial Resource Strain: Low Risk  (07/10/2023)  Physical Activity: Inactive (07/10/2023)  Social Connections: Socially Isolated (05/02/2024)  Stress: No Stress Concern Present (07/10/2023)  Tobacco Use: Medium Risk (05/04/2024)  Health Literacy: Adequate Health Literacy (07/10/2023)     Readmission Risk Interventions     No data to display

## 2024-05-06 NOTE — Progress Notes (Signed)
 Attempted to call report to [facility] facility. Awaiting callback

## 2024-05-06 NOTE — Plan of Care (Signed)
  Problem: Education: Goal: Knowledge of General Education information will improve Description: Including pain rating scale, medication(s)/side effects and non-pharmacologic comfort measures Outcome: Adequate for Discharge   Problem: Health Behavior/Discharge Planning: Goal: Ability to manage health-related needs will improve Outcome: Adequate for Discharge   Problem: Clinical Measurements: Goal: Ability to maintain clinical measurements within normal limits will improve Outcome: Adequate for Discharge Goal: Will remain free from infection Outcome: Adequate for Discharge Goal: Diagnostic test results will improve Outcome: Adequate for Discharge Goal: Respiratory complications will improve Outcome: Adequate for Discharge Goal: Cardiovascular complication will be avoided Outcome: Adequate for Discharge   Problem: Activity: Goal: Risk for activity intolerance will decrease Outcome: Adequate for Discharge   Problem: Nutrition: Goal: Adequate nutrition will be maintained Outcome: Adequate for Discharge   Problem: Coping: Goal: Level of anxiety will decrease Outcome: Adequate for Discharge   Problem: Elimination: Goal: Will not experience complications related to bowel motility Outcome: Adequate for Discharge Goal: Will not experience complications related to urinary retention Outcome: Adequate for Discharge   Problem: Pain Managment: Goal: General experience of comfort will improve and/or be controlled Outcome: Adequate for Discharge   Problem: Safety: Goal: Ability to remain free from injury will improve Outcome: Adequate for Discharge   Problem: Skin Integrity: Goal: Risk for impaired skin integrity will decrease Outcome: Adequate for Discharge   Problem: Acute Rehab OT Goals (only OT should resolve) Goal: Pt. Will Perform Grooming Outcome: Adequate for Discharge Goal: Pt. Will Perform Upper Body Bathing Outcome: Adequate for Discharge Goal: Pt. Will Perform  Lower Body Bathing Outcome: Adequate for Discharge Goal: Pt. Will Transfer To Toilet Outcome: Adequate for Discharge   Problem: Acute Rehab PT Goals(only PT should resolve) Goal: Pt Will Go Supine/Side To Sit Outcome: Adequate for Discharge Goal: Pt Will Go Sit To Supine/Side Outcome: Adequate for Discharge Goal: Patient Will Transfer Sit To/From Stand Outcome: Adequate for Discharge Goal: Pt Will Transfer Bed To Chair/Chair To Bed Outcome: Adequate for Discharge Goal: Pt Will Ambulate Outcome: Adequate for Discharge   Problem: Malnutrition  (NI-5.2) Goal: Food and/or nutrient delivery Description: Individualized approach for food/nutrient provision. Outcome: Adequate for Discharge

## 2024-05-07 DIAGNOSIS — N400 Enlarged prostate without lower urinary tract symptoms: Secondary | ICD-10-CM | POA: Diagnosis not present

## 2024-05-07 DIAGNOSIS — I10 Essential (primary) hypertension: Secondary | ICD-10-CM | POA: Diagnosis not present

## 2024-05-07 DIAGNOSIS — I4891 Unspecified atrial fibrillation: Secondary | ICD-10-CM | POA: Diagnosis not present

## 2024-05-07 DIAGNOSIS — J9610 Chronic respiratory failure, unspecified whether with hypoxia or hypercapnia: Secondary | ICD-10-CM | POA: Diagnosis not present

## 2024-05-08 DIAGNOSIS — J449 Chronic obstructive pulmonary disease, unspecified: Secondary | ICD-10-CM | POA: Diagnosis not present

## 2024-05-08 DIAGNOSIS — N400 Enlarged prostate without lower urinary tract symptoms: Secondary | ICD-10-CM | POA: Diagnosis not present

## 2024-05-08 DIAGNOSIS — G40909 Epilepsy, unspecified, not intractable, without status epilepticus: Secondary | ICD-10-CM | POA: Diagnosis not present

## 2024-05-08 DIAGNOSIS — S72142D Displaced intertrochanteric fracture of left femur, subsequent encounter for closed fracture with routine healing: Secondary | ICD-10-CM | POA: Diagnosis not present

## 2024-05-08 DIAGNOSIS — I4891 Unspecified atrial fibrillation: Secondary | ICD-10-CM | POA: Diagnosis not present

## 2024-05-08 DIAGNOSIS — I679 Cerebrovascular disease, unspecified: Secondary | ICD-10-CM | POA: Diagnosis not present

## 2024-05-08 DIAGNOSIS — I10 Essential (primary) hypertension: Secondary | ICD-10-CM | POA: Diagnosis not present

## 2024-05-12 DIAGNOSIS — J9610 Chronic respiratory failure, unspecified whether with hypoxia or hypercapnia: Secondary | ICD-10-CM | POA: Diagnosis not present

## 2024-05-12 DIAGNOSIS — S72142D Displaced intertrochanteric fracture of left femur, subsequent encounter for closed fracture with routine healing: Secondary | ICD-10-CM | POA: Diagnosis not present

## 2024-05-13 DIAGNOSIS — R2689 Other abnormalities of gait and mobility: Secondary | ICD-10-CM | POA: Diagnosis not present

## 2024-05-13 DIAGNOSIS — Z4789 Encounter for other orthopedic aftercare: Secondary | ICD-10-CM | POA: Diagnosis not present

## 2024-05-13 DIAGNOSIS — J449 Chronic obstructive pulmonary disease, unspecified: Secondary | ICD-10-CM | POA: Diagnosis not present

## 2024-05-13 DIAGNOSIS — G4089 Other seizures: Secondary | ICD-10-CM | POA: Diagnosis not present

## 2024-05-13 DIAGNOSIS — S72002S Fracture of unspecified part of neck of left femur, sequela: Secondary | ICD-10-CM | POA: Diagnosis not present

## 2024-05-13 DIAGNOSIS — I4891 Unspecified atrial fibrillation: Secondary | ICD-10-CM | POA: Diagnosis not present

## 2024-05-13 DIAGNOSIS — M6281 Muscle weakness (generalized): Secondary | ICD-10-CM | POA: Diagnosis not present

## 2024-05-13 DIAGNOSIS — I5032 Chronic diastolic (congestive) heart failure: Secondary | ICD-10-CM | POA: Diagnosis not present

## 2024-05-14 ENCOUNTER — Encounter (HOSPITAL_COMMUNITY): Payer: Self-pay | Admitting: Orthopedic Surgery

## 2024-05-14 ENCOUNTER — Other Ambulatory Visit: Payer: Self-pay | Admitting: Adult Health

## 2024-05-15 ENCOUNTER — Ambulatory Visit

## 2024-05-19 DIAGNOSIS — S72142D Displaced intertrochanteric fracture of left femur, subsequent encounter for closed fracture with routine healing: Secondary | ICD-10-CM | POA: Diagnosis not present

## 2024-05-19 DIAGNOSIS — J9610 Chronic respiratory failure, unspecified whether with hypoxia or hypercapnia: Secondary | ICD-10-CM | POA: Diagnosis not present

## 2024-05-20 DIAGNOSIS — I5032 Chronic diastolic (congestive) heart failure: Secondary | ICD-10-CM | POA: Diagnosis not present

## 2024-05-20 DIAGNOSIS — S72002S Fracture of unspecified part of neck of left femur, sequela: Secondary | ICD-10-CM | POA: Diagnosis not present

## 2024-05-20 DIAGNOSIS — R2689 Other abnormalities of gait and mobility: Secondary | ICD-10-CM | POA: Diagnosis not present

## 2024-05-20 DIAGNOSIS — I4891 Unspecified atrial fibrillation: Secondary | ICD-10-CM | POA: Diagnosis not present

## 2024-05-20 DIAGNOSIS — S72142D Displaced intertrochanteric fracture of left femur, subsequent encounter for closed fracture with routine healing: Secondary | ICD-10-CM | POA: Diagnosis not present

## 2024-05-20 DIAGNOSIS — Z4789 Encounter for other orthopedic aftercare: Secondary | ICD-10-CM | POA: Diagnosis not present

## 2024-05-20 DIAGNOSIS — M6281 Muscle weakness (generalized): Secondary | ICD-10-CM | POA: Diagnosis not present

## 2024-05-20 DIAGNOSIS — J449 Chronic obstructive pulmonary disease, unspecified: Secondary | ICD-10-CM | POA: Diagnosis not present

## 2024-05-20 DIAGNOSIS — G4089 Other seizures: Secondary | ICD-10-CM | POA: Diagnosis not present

## 2024-05-22 DIAGNOSIS — J9610 Chronic respiratory failure, unspecified whether with hypoxia or hypercapnia: Secondary | ICD-10-CM | POA: Diagnosis not present

## 2024-05-27 DIAGNOSIS — I5032 Chronic diastolic (congestive) heart failure: Secondary | ICD-10-CM | POA: Diagnosis not present

## 2024-05-27 DIAGNOSIS — R2689 Other abnormalities of gait and mobility: Secondary | ICD-10-CM | POA: Diagnosis not present

## 2024-05-27 DIAGNOSIS — J449 Chronic obstructive pulmonary disease, unspecified: Secondary | ICD-10-CM | POA: Diagnosis not present

## 2024-05-27 DIAGNOSIS — S72142D Displaced intertrochanteric fracture of left femur, subsequent encounter for closed fracture with routine healing: Secondary | ICD-10-CM | POA: Diagnosis not present

## 2024-05-27 DIAGNOSIS — M6281 Muscle weakness (generalized): Secondary | ICD-10-CM | POA: Diagnosis not present

## 2024-05-27 DIAGNOSIS — G4089 Other seizures: Secondary | ICD-10-CM | POA: Diagnosis not present

## 2024-05-27 DIAGNOSIS — Z4789 Encounter for other orthopedic aftercare: Secondary | ICD-10-CM | POA: Diagnosis not present

## 2024-05-27 DIAGNOSIS — I4891 Unspecified atrial fibrillation: Secondary | ICD-10-CM | POA: Diagnosis not present

## 2024-05-27 DIAGNOSIS — J189 Pneumonia, unspecified organism: Secondary | ICD-10-CM | POA: Diagnosis not present

## 2024-05-27 DIAGNOSIS — S72002S Fracture of unspecified part of neck of left femur, sequela: Secondary | ICD-10-CM | POA: Diagnosis not present

## 2024-05-29 DIAGNOSIS — I4891 Unspecified atrial fibrillation: Secondary | ICD-10-CM | POA: Diagnosis not present

## 2024-06-02 DIAGNOSIS — I509 Heart failure, unspecified: Secondary | ICD-10-CM | POA: Diagnosis not present

## 2024-06-02 DIAGNOSIS — S72142D Displaced intertrochanteric fracture of left femur, subsequent encounter for closed fracture with routine healing: Secondary | ICD-10-CM | POA: Diagnosis not present

## 2024-06-03 ENCOUNTER — Other Ambulatory Visit: Payer: Self-pay | Admitting: Adult Health

## 2024-06-03 DIAGNOSIS — S72002S Fracture of unspecified part of neck of left femur, sequela: Secondary | ICD-10-CM | POA: Diagnosis not present

## 2024-06-03 DIAGNOSIS — J449 Chronic obstructive pulmonary disease, unspecified: Secondary | ICD-10-CM | POA: Diagnosis not present

## 2024-06-03 DIAGNOSIS — I4891 Unspecified atrial fibrillation: Secondary | ICD-10-CM | POA: Diagnosis not present

## 2024-06-03 DIAGNOSIS — R2689 Other abnormalities of gait and mobility: Secondary | ICD-10-CM | POA: Diagnosis not present

## 2024-06-03 DIAGNOSIS — G4089 Other seizures: Secondary | ICD-10-CM | POA: Diagnosis not present

## 2024-06-03 DIAGNOSIS — Z4789 Encounter for other orthopedic aftercare: Secondary | ICD-10-CM | POA: Diagnosis not present

## 2024-06-03 DIAGNOSIS — M6281 Muscle weakness (generalized): Secondary | ICD-10-CM | POA: Diagnosis not present

## 2024-06-03 DIAGNOSIS — Z760 Encounter for issue of repeat prescription: Secondary | ICD-10-CM

## 2024-06-03 DIAGNOSIS — I5032 Chronic diastolic (congestive) heart failure: Secondary | ICD-10-CM | POA: Diagnosis not present

## 2024-06-03 NOTE — Telephone Encounter (Signed)
 Patient need to schedule for more refills.

## 2024-06-05 DIAGNOSIS — I509 Heart failure, unspecified: Secondary | ICD-10-CM | POA: Diagnosis not present

## 2024-06-10 DIAGNOSIS — G4089 Other seizures: Secondary | ICD-10-CM | POA: Diagnosis not present

## 2024-06-10 DIAGNOSIS — S72002S Fracture of unspecified part of neck of left femur, sequela: Secondary | ICD-10-CM | POA: Diagnosis not present

## 2024-06-10 DIAGNOSIS — R2689 Other abnormalities of gait and mobility: Secondary | ICD-10-CM | POA: Diagnosis not present

## 2024-06-10 DIAGNOSIS — J449 Chronic obstructive pulmonary disease, unspecified: Secondary | ICD-10-CM | POA: Diagnosis not present

## 2024-06-10 DIAGNOSIS — Z4789 Encounter for other orthopedic aftercare: Secondary | ICD-10-CM | POA: Diagnosis not present

## 2024-06-10 DIAGNOSIS — M6281 Muscle weakness (generalized): Secondary | ICD-10-CM | POA: Diagnosis not present

## 2024-06-10 DIAGNOSIS — I5032 Chronic diastolic (congestive) heart failure: Secondary | ICD-10-CM | POA: Diagnosis not present

## 2024-06-10 DIAGNOSIS — I4891 Unspecified atrial fibrillation: Secondary | ICD-10-CM | POA: Diagnosis not present

## 2024-06-11 DIAGNOSIS — J9610 Chronic respiratory failure, unspecified whether with hypoxia or hypercapnia: Secondary | ICD-10-CM | POA: Diagnosis not present

## 2024-06-11 DIAGNOSIS — J449 Chronic obstructive pulmonary disease, unspecified: Secondary | ICD-10-CM | POA: Diagnosis not present

## 2024-06-11 DIAGNOSIS — I509 Heart failure, unspecified: Secondary | ICD-10-CM | POA: Diagnosis not present

## 2024-06-15 DIAGNOSIS — I679 Cerebrovascular disease, unspecified: Secondary | ICD-10-CM | POA: Diagnosis not present

## 2024-06-15 DIAGNOSIS — N400 Enlarged prostate without lower urinary tract symptoms: Secondary | ICD-10-CM | POA: Diagnosis not present

## 2024-06-15 DIAGNOSIS — I509 Heart failure, unspecified: Secondary | ICD-10-CM | POA: Diagnosis not present

## 2024-06-15 DIAGNOSIS — S72142D Displaced intertrochanteric fracture of left femur, subsequent encounter for closed fracture with routine healing: Secondary | ICD-10-CM | POA: Diagnosis not present

## 2024-06-15 DIAGNOSIS — J9610 Chronic respiratory failure, unspecified whether with hypoxia or hypercapnia: Secondary | ICD-10-CM | POA: Diagnosis not present

## 2024-06-15 DIAGNOSIS — I4891 Unspecified atrial fibrillation: Secondary | ICD-10-CM | POA: Diagnosis not present

## 2024-06-15 DIAGNOSIS — J449 Chronic obstructive pulmonary disease, unspecified: Secondary | ICD-10-CM | POA: Diagnosis not present

## 2024-06-18 ENCOUNTER — Telehealth: Payer: Self-pay | Admitting: *Deleted

## 2024-06-18 NOTE — Telephone Encounter (Signed)
 Copied from CRM #[id number]. Topic: Clinical - Home Health Verbal Orders >> Jun 18, 2024 12:54 PM Ahlexyia S wrote: Caller/Agency: Nena with [facility]  Callback Number: [phone] Service Requested: Skilled Nursing Frequency: 3x weekly and every other week x3 Any new concerns about the patient? No

## 2024-06-18 NOTE — Telephone Encounter (Signed)
Okay for v/o? °

## 2024-06-18 NOTE — Telephone Encounter (Signed)
 Left message to return phone call.

## 2024-06-19 ENCOUNTER — Inpatient Hospital Stay: Admitting: Adult Health

## 2024-06-19 ENCOUNTER — Telehealth: Payer: Self-pay

## 2024-06-19 NOTE — Progress Notes (Deleted)
 Subjective:    Patient ID: Jeffery Copeland, male    DOB: Aug 26, 1945, 78 y.o.   MRN: [id number]  HPI 78 year old male who  has a past medical history of Acute and subacute bacterial endocarditis (1999), Atrial fibrillation (HCC) (11/11/2008), CARDIOMYOPATHY (10/20/2008), Cerebrovascular disease, unspecified, Closed right acetabular fracture (HCC), COPD (chronic obstructive pulmonary disease) (HCC) (1999), Epidural hematoma (HCC) (03/01/2011), Esophageal reflux, Generalized osteoarthrosis, unspecified site, History of tobacco abuse, Hypertension, HYPERTENSION, BENIGN (10/20/2008), Hyponatremia, Insomnia, Iron deficiency anemia, unspecified, ISCHEMIC COLITIS, HX OF (10/20/2008), Lung nodules (2008, 2014), Prostate cancer (HCC) (2015), Protruding sternal wires, S/P mitral valve replacement (02/01/1998), S/P mitral valve replacement with bioprosthetic valve (03/24/2013), Seizures (HCC), Severe mitral regurgitation (02/20/2013), and Unsteady gait.  He presents for TCM visit  Admit Date10/10/2023 Discharge from [facility] 05/06/2024 Discharge from rehab 06/15/2024  He presented to the hospital after a mechanical fall.  He reported that he was walking and tripped over and had a fall.  Denied having injury to head or neck, was found to have a left femoral neck fracture.  Hospital Course  Left hip fracture  - Status post IM rod.  Recovered well postoperatively and was able to work with PT and short-term rehab was recommended.  History of prosthetic MVR x 2  -on Coumadin  which was held for surgery and resumed on discharge  History of seizure - Continued on Keppra  and Dilantin .  No seizures witnessed by family  PAF with RVR - Continued on metoprolol  and anticoagulation with Coumadin .  He did require Cardizem  infusion on admission for RVR  BPH - Continued on Flomax   Hyperlipidemia - Continued on Zetia   Leukocytosis - Likely stress response.  This resolved upon discharge  Vitamin D  deficiency - Continue  with supplementation  History of COPD - No wheezing or exacerbations during hospital admission reports at baseline.  Continue current regimen   Review of Systems  Constitutional: Negative.   HENT: Negative.    Eyes: Negative.   Respiratory: Negative.    Cardiovascular: Negative.   Gastrointestinal: Negative.   Endocrine: Negative.   Genitourinary: Negative.   Musculoskeletal: Negative.   Skin: Negative.   Allergic/Immunologic: Negative.   Neurological: Negative.   Hematological: Negative.   Psychiatric/Behavioral: Negative.    All other systems reviewed and are negative.  Past Medical History:  Diagnosis Date   Acute and subacute bacterial endocarditis 1999   group G Streptococcus   Atrial fibrillation (HCC) 11/11/2008   Qualifier: Diagnosis of  By: Morris, MD, CODY Debby Lenis    CARDIOMYOPATHY 10/20/2008   Qualifier: Diagnosis of  By: Delores, RN, BSN, Lauren     Cerebrovascular disease, unspecified    Closed right acetabular fracture (HCC)    COPD (chronic obstructive pulmonary disease) (HCC) 1999   Epidural hematoma (HCC) 03/01/2011   Recent fall 2012    Esophageal reflux    Generalized osteoarthrosis, unspecified site    History of tobacco abuse    Hypertension    HYPERTENSION, BENIGN 10/20/2008   Qualifier: Diagnosis of  By: Delores, RN, BSN, Lauren     Hyponatremia    Insomnia    Iron deficiency anemia, unspecified    ISCHEMIC COLITIS, HX OF 10/20/2008   Qualifier: Diagnosis of  By: Delores, RN, BSN, Lauren     Lung nodules 2008, 2014   LLL nodule removed 2008 with LLL lobectomy,  RLL nodule 3mm observation   Prostate cancer (HCC) 2015   Protruding sternal wires    S/P mitral valve replacement 02/01/1998  Carpentier-Edwards porcine bioprosthetic tissue valve, size 31mm Placed for acute and subacute bacterial endocarditis (group G Streptococcus) with pre-existing mitral valve prolapse    S/P mitral valve replacement with bioprosthetic valve 03/24/2013   Redo mitral  valve replacement using 29mm Edwards Magna mitral bovine bioprosthetic tissue valve performed via right mini thoracotomy   Seizures (HCC)    last seizure 1 year ago   Severe mitral regurgitation 02/20/2013   Unsteady gait     Social History   Socioeconomic History   Marital status: Divorced    Spouse name: Not on file   Number of children: 2   Years of education: 9   Highest education level: Not on file  Occupational History   Occupation: disabled  Tobacco Use   Smoking status: Former    Current packs/day: 0.00    Average packs/day: 2.0 packs/day for 45.0 years (90.0 ttl pk-yrs)    Types: Cigarettes    Start date: 07/30/1961    Quit date: 07/30/2006    Years since quitting: 17.9   Smokeless tobacco: Never  Vaping Use   Vaping status: Never Used  Substance and Sexual Activity   Alcohol use: No    Alcohol/week: 0.0 standard drinks of alcohol    Comment: previous alcohol use & home-made alcohol consumption   Drug use: No   Sexual activity: Not Currently  Other Topics Concern   Not on file  Social History Narrative   Patient is divorced and has 2 children.   Patient is right handed.   Patient has 9 th grade education.    Patient drinks diet sodas.      [place]:   From [place] originally. Always lived in [place]. 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 Questionable asbestos exposure. Previously traveled to [place], [place], & [place]. No pets currently. Remote pet owl and parakeet exposure. No mold exposure. Enjoys fishing.    Social Drivers of Corporate Investment Banker Strain: Low Risk  (07/10/2023)   Overall Financial Resource Strain (CARDIA)    Difficulty of Paying Living Expenses: Not hard at all  Food Insecurity: No Food Insecurity (05/02/2024)   Hunger Vital Sign    Worried About Running Out of Food in the Last Year: Never true    Ran Out of Food in the Last Year: Never true  Transportation Needs: No Transportation Needs (05/02/2024)   PRAPARE - Scientist, Research (physical Sciences) (Medical): No    Lack of Transportation (Non-Medical): No  Physical Activity: Inactive (07/10/2023)   Exercise Vital Sign    Days of Exercise per Week: 0 days    Minutes of Exercise per Session: 0 min  Stress: No Stress Concern Present (07/10/2023)   [employer] of Occupational Health - Occupational Stress Questionnaire    Feeling of Stress : Not at all  Social Connections: Socially Isolated (05/02/2024)   Social Connection and Isolation Panel    Frequency of Communication with Friends and Family: Once a week    Frequency of Social Gatherings with Friends and Family: Three times a week    Attends Religious Services: Never    Active Member of Clubs or Organizations: No    Attends Banker Meetings: Never    Marital Status: Divorced  Catering Manager Violence: Not At Risk (05/02/2024)   Humiliation, Afraid, Rape, and Kick questionnaire    Fear of Current or Ex-Partner: No    Emotionally Abused: No    Physically Abused: No    Sexually Abused: No  Past Surgical History:  Procedure Laterality Date   CARDIAC VALVE REPLACEMENT     FEMUR IM NAIL Left 05/04/2024   Procedure: INSERTION, INTRAMEDULLARY ROD, FEMUR;  Surgeon: Celena Sharper, MD;  Location: [facility] OR;  Service: Orthopedics;  Laterality: Left;   INTRAOPERATIVE TRANSESOPHAGEAL ECHOCARDIOGRAM N/A 03/24/2013   Procedure: INTRAOPERATIVE TRANSESOPHAGEAL ECHOCARDIOGRAM;  Surgeon: Sudie VEAR Laine, MD;  Location: [facility] OR;  Service: Open Heart Surgery;  Laterality: N/A;   MINIMALLY INVASIVE MAZE PROCEDURE N/A 03/24/2013   Procedure: MINIMALLY INVASIVE MAZE PROCEDURE;  Surgeon: Sudie VEAR Laine, MD;  Location: [facility] OR;  Service: Open Heart Surgery;  Laterality: N/A;   MITRAL VALVE REPLACEMENT  02/01/1998   Carpentier-Edwards porcine bioprosthetic tissue valve, size 31mm, placed for complicated bacterial endocarditis   MITRAL VALVE REPLACEMENT Right 03/24/2013   Procedure: MINIMALLY INVASIVE REDO MITRAL VALVE (MV)  REPLACEMENT;  Surgeon: Sudie VEAR Laine, MD;  Location: [facility] OR;  Service: Open Heart Surgery;  Laterality: Right;   PROSTATE BIOPSY     radiation treatment tx for prostate cancer  2015   STERNAL WIRES REMOVAL N/A 07/10/2018   Procedure: STERNAL WIRES REMOVAL;  Surgeon: Laine Sudie VEAR, MD;  Location: [facility] OR;  Service: Thoracic;  Laterality: N/A;   TEE WITHOUT CARDIOVERSION N/A 02/12/2013   Procedure: TRANSESOPHAGEAL ECHOCARDIOGRAM (TEE);  Surgeon: Vina LULLA Gull, MD;  Location: [facility] ENDOSCOPY;  Service: Cardiovascular;  Laterality: N/A;   VIDEO ASSISTED THORACOSCOPY  04/29/2007   Left VATS w/ mini thoracotomy for Left Lower Lobectomy for benign lung nodules    Family History  Problem Relation Age of Onset   Cancer Father        prostate cancer   Tuberculosis Father    Heart attack Father    Cancer Brother        prostate cancer   Arthritis Mother    Lung disease Neg Hx     Allergies  Allergen Reactions   Ace Inhibitors Swelling   Lisinopril      Seizures    Ramipril      Other Reaction(s): gum swelling   Rosuvastatin Other (See Comments)    Muscle aches    Current Outpatient Medications on File Prior to Visit  Medication Sig Dispense Refill   albuterol  (VENTOLIN  HFA) 108 (90 Base) MCG/ACT inhaler Inhale 2 puffs into the lungs every 6 (six) hours as needed for wheezing or shortness of breath. 51 each 3   Budeson-Glycopyrrol-Formoterol  (BREZTRI  AEROSPHERE) 160-9-4.8 MCG/ACT AERO Inhale 2 puffs into the lungs in the morning and at bedtime. 32.1 each 3   cholecalciferol  (VITAMIN D3) 25 MCG (1000 UNIT) tablet Take 1,000 Units by mouth daily.     ezetimibe  (ZETIA ) 10 MG tablet TAKE 1 TABLET EVERY DAY 90 tablet 3   levETIRAcetam  (KEPPRA ) 500 MG tablet Take 1 tablet (500 mg total) by mouth 2 (two) times daily. 180 tablet 3   metoprolol  tartrate (LOPRESSOR ) 25 MG tablet TAKE 1/2 TABLET TWICE DAILY 90 tablet 3   Multiple Vitamins-Minerals (PRESERVISION AREDS 2+MULTI VIT PO) Take 1 capsule by  mouth daily.     oxyCODONE  (OXY IR/ROXICODONE ) 5 MG immediate release tablet Take 1 tablet (5 mg total) by mouth every 4 (four) hours as needed for severe pain (pain score 7-10) or breakthrough pain. 10 tablet 0   phenytoin  (DILANTIN ) 100 MG ER capsule Take 3 capsules (300 mg total) by mouth at bedtime. 270 capsule 3   tamsulosin  (FLOMAX ) 0.4 MG CAPS capsule Take 0.4 mg by mouth daily.     tiZANidine  (ZANAFLEX ) 4  MG tablet TAKE 1 TABLET EVERY 6 HOURS AS NEEDED FOR MUSCLE SPASMS 360 tablet 3   warfarin (COUMADIN ) 6 MG tablet TAKE 1/2 TO 1 TABLET ONE TIME DAILY AS DIRECTED BY COUMADIN  CLINIC (Patient taking differently: TAKE 1/2 TO 1 TABLET ONE TIME DAILY AS DIRECTED BY COUMADIN  CLINIC. Takes 6 mg on Sundays and 3 mg on all other days.) 90 tablet 3   No current facility-administered medications on file prior to visit.    There were no vitals taken for this visit.      Objective:   Physical Exam Vitals and nursing note reviewed.  Constitutional:      Appearance: Normal appearance. He is obese.  Cardiovascular:     Rate and Rhythm: Normal rate and regular rhythm.     Pulses: Normal pulses.     Heart sounds: Normal heart sounds.  Pulmonary:     Effort: Pulmonary effort is normal.     Breath sounds: Normal breath sounds.  Skin:    General: Skin is warm and dry.  Neurological:     General: No focal deficit present.     Mental Status: He is alert and oriented to person, place, and time.  Psychiatric:        Mood and Affect: Mood normal.        Behavior: Behavior normal.        Thought Content: Thought content normal.        Judgment: Judgment normal.           Assessment & Plan:

## 2024-06-19 NOTE — Telephone Encounter (Signed)
 Copied from CRM #[id number]. Topic: Clinical - Home Health Verbal Orders >> Jun 18, 2024 12:54 PM Ahlexyia S wrote: Caller/Agency: Nena with [facility]  Callback Number: [phone] Service Requested: Skilled Nursing Frequency: 3x weekly and every other week x3 Any new concerns about the patient? No >> Jun 19, 2024  8:22 AM Antwanette L wrote: Nena from [facility] called to check on the status of the patient's home health order for skilled nursing. She is requesting a callback at [phone].

## 2024-06-19 NOTE — Telephone Encounter (Signed)
 Copied from CRM [id number]. Topic: Clinical - Home Health Verbal Orders >> Jun 19, 2024  9:49 AM Ashley R wrote: Caller/Agency: [facility]) Callback Number: [phone] Service Requested: Physical Therapy, (Strengthening, balance, ambulation) Frequency: 1x week for 2 weeks, 2x week for 4 weeks, 1x week for 3 weeks Any new concerns about the patient? No, just discharged.

## 2024-06-23 NOTE — Telephone Encounter (Signed)
Left message on machine to return our call. 

## 2024-06-23 NOTE — Telephone Encounter (Signed)
 Verbal orders given to [facility]) for PT.

## 2024-06-24 NOTE — Telephone Encounter (Signed)
 Left message to return phone call.

## 2024-06-24 NOTE — Telephone Encounter (Signed)
 Verbal orders given to Sandra

## 2024-07-01 ENCOUNTER — Ambulatory Visit: Payer: Self-pay | Admitting: Pharmacist

## 2024-07-01 DIAGNOSIS — I4891 Unspecified atrial fibrillation: Secondary | ICD-10-CM | POA: Diagnosis not present

## 2024-07-01 DIAGNOSIS — Z5181 Encounter for therapeutic drug level monitoring: Secondary | ICD-10-CM | POA: Diagnosis not present

## 2024-07-01 LAB — POCT INR: INR: 2 (ref 2.0–3.0)

## 2024-07-06 NOTE — Patient Instructions (Signed)
 Description   Spoke with [facility]. Take 6mg  today and then continue taking Warfarin 1/2 tablet daily except 1 tablet on Sundays.  Stay consistent with greens (3 per week)  Recheck INR in 2 weeks per patient request.  Coumadin  Clinic [phone]

## 2024-07-06 NOTE — Progress Notes (Signed)
 Description   Spoke with [facility]. Take 6mg  today and then continue taking Warfarin 1/2 tablet daily except 1 tablet on Sundays.  Stay consistent with greens (3 per week)  Recheck INR in 2 weeks per patient request.  Coumadin  Clinic [phone]

## 2024-07-15 ENCOUNTER — Ambulatory Visit (INDEPENDENT_AMBULATORY_CARE_PROVIDER_SITE_OTHER): Admitting: *Deleted

## 2024-07-15 DIAGNOSIS — I4891 Unspecified atrial fibrillation: Secondary | ICD-10-CM | POA: Diagnosis not present

## 2024-07-15 DIAGNOSIS — Z5181 Encounter for therapeutic drug level monitoring: Secondary | ICD-10-CM | POA: Diagnosis not present

## 2024-07-15 LAB — POCT INR: INR: 2.4 (ref 2.0–3.0)

## 2024-07-15 NOTE — Progress Notes (Signed)
 Description   Spoke with [facility] today take 6mg  and then START taking Warfarin 1/2 tablet daily except 1 tablet on Sundays and Wednesdays.  Stay consistent with greens (3 per week)  Recheck INR in 12 days by [facility]. Coumadin  Clinic [phone]

## 2024-07-15 NOTE — Patient Instructions (Addendum)
° °  Description   Spoke with [facility] today take 6mg  and then START taking Warfarin 1/2 tablet daily except 1 tablet on Sundays and Wednesdays.  Stay consistent with greens (3 per week)  Recheck INR in 12 days by [facility]. Coumadin  Clinic [phone]

## 2024-07-27 ENCOUNTER — Ambulatory Visit (INDEPENDENT_AMBULATORY_CARE_PROVIDER_SITE_OTHER): Admitting: Cardiovascular Disease

## 2024-07-27 ENCOUNTER — Telehealth: Payer: Self-pay | Admitting: Adult Health

## 2024-07-27 DIAGNOSIS — Z952 Presence of prosthetic heart valve: Secondary | ICD-10-CM

## 2024-07-27 DIAGNOSIS — Z5181 Encounter for therapeutic drug level monitoring: Secondary | ICD-10-CM

## 2024-07-27 LAB — POCT INR: INR: 2.5 (ref 2.0–3.0)

## 2024-07-27 NOTE — Progress Notes (Signed)
 Lab Results  Component Value Date   INR 2.5 07/27/2024   INR 2.4 07/15/2024   INR 2.0 07/01/2024   PROTIME 19.9 01/17/2009   PROTIME 19.5 01/11/2009   PROTIME 17.6 01/04/2009    Description   Spoke with [facility] today: INR 2.5; Continue taking Warfarin 1/2 tablet daily except 1 tablet on Sundays and Wednesdays.  Stay consistent with greens (3 per week)  Recheck INR in 2 weeks by homehealth.  Coumadin  Clinic [phone]

## 2024-07-27 NOTE — Telephone Encounter (Signed)
 Patient's spouse dropped off document verification form, to be filled out by provider. Patient requested to send it back via Fax within 7-days. Document is located in providers tray at front office.Please advise at Mobile [phone] (mobile)

## 2024-07-27 NOTE — Patient Instructions (Addendum)
 Description   Spoke with [facility] today: INR 2.5; Continue taking Warfarin 1/2 tablet daily except 1 tablet on Sundays and Wednesdays.  Stay consistent with greens (3 per week)  Recheck INR in 2 weeks by homehealth.  Coumadin  Clinic [phone]

## 2024-07-28 NOTE — Telephone Encounter (Signed)
Noted! PPW placed on providers desk 

## 2024-07-31 NOTE — Telephone Encounter (Signed)
 Called pt and pt son (emergency contact) to advised that form needed to be signed by pt. Pt wanted me to let his son know but his son did not answer. No vm was left due to the phone kept ringing. Will try again later.

## 2024-07-31 NOTE — Telephone Encounter (Signed)
 Pt son notified that pt need to come in and sign the form so that we can fax it over. Pt will come in Monday to sign form. Form will be placed in front office filing cabinet. Since I am off on Mondays, I will have front office staff place it back in providers folder.

## 2024-08-05 ENCOUNTER — Telehealth: Payer: Self-pay | Admitting: Adult Health

## 2024-08-05 NOTE — Telephone Encounter (Signed)
 [employer] [place] Professional Verification Form to be filled out--placed in provider's folder.  Please fax to [phone] upon completion.

## 2024-08-05 NOTE — Telephone Encounter (Signed)
Formed received

## 2024-08-06 NOTE — Telephone Encounter (Signed)
"  Form faxed with confirmation  "

## 2024-08-10 ENCOUNTER — Ambulatory Visit: Payer: Self-pay | Admitting: Cardiology

## 2024-08-10 DIAGNOSIS — Z5181 Encounter for therapeutic drug level monitoring: Secondary | ICD-10-CM

## 2024-08-10 LAB — POCT INR: INR: 5.3 — AB (ref 2.0–3.0)

## 2024-08-10 NOTE — Progress Notes (Signed)
"   INR 5.3 Please see anticoagulation encounter Spoke with [facility] today: Hold today, Tuesday and Wednesday then Continue taking Warfarin 1/2 tablet daily except 1 tablet on Sundays and Wednesdays.  Stay consistent with greens (3 per week)  Recheck INR in 2 weeks.  Coumadin  Clinic [phone] "

## 2024-08-24 ENCOUNTER — Ambulatory Visit: Attending: Cardiology

## 2024-08-25 ENCOUNTER — Telehealth: Payer: Self-pay | Admitting: Pharmacist

## 2024-08-25 NOTE — Telephone Encounter (Signed)
 Called patient to reschedule appt from 08/24/24. Pt will call back

## 2024-09-18 ENCOUNTER — Ambulatory Visit

## 2025-02-24 ENCOUNTER — Ambulatory Visit: Admitting: Adult Health
